# Patient Record
Sex: Male | Born: 1935
Health system: Southern US, Community
[De-identification: ages and names within clinical notes are randomized; demographics above are authoritative.]

## PROBLEM LIST (undated history)

## (undated) DIAGNOSIS — E782 Mixed hyperlipidemia: Secondary | ICD-10-CM

## (undated) DIAGNOSIS — G8929 Other chronic pain: Secondary | ICD-10-CM

## (undated) DIAGNOSIS — C61 Malignant neoplasm of prostate: Secondary | ICD-10-CM

## (undated) DIAGNOSIS — C4490 Unspecified malignant neoplasm of skin, unspecified: Secondary | ICD-10-CM

## (undated) DIAGNOSIS — I214 Non-ST elevation (NSTEMI) myocardial infarction: Secondary | ICD-10-CM

## (undated) DIAGNOSIS — H9190 Unspecified hearing loss, unspecified ear: Secondary | ICD-10-CM

## (undated) DIAGNOSIS — I255 Ischemic cardiomyopathy: Secondary | ICD-10-CM

## (undated) DIAGNOSIS — R739 Hyperglycemia, unspecified: Secondary | ICD-10-CM

## (undated) DIAGNOSIS — J189 Pneumonia, unspecified organism: Secondary | ICD-10-CM

## (undated) DIAGNOSIS — I480 Paroxysmal atrial fibrillation: Secondary | ICD-10-CM

## (undated) DIAGNOSIS — I2510 Atherosclerotic heart disease of native coronary artery without angina pectoris: Secondary | ICD-10-CM

## (undated) DIAGNOSIS — Z808 Family history of malignant neoplasm of other organs or systems: Secondary | ICD-10-CM

## (undated) DIAGNOSIS — Z978 Presence of other specified devices: Secondary | ICD-10-CM

## (undated) DIAGNOSIS — I10 Essential (primary) hypertension: Secondary | ICD-10-CM

## (undated) DIAGNOSIS — Z800 Family history of malignant neoplasm of digestive organs: Secondary | ICD-10-CM

## (undated) DIAGNOSIS — J45909 Unspecified asthma, uncomplicated: Secondary | ICD-10-CM

## (undated) DIAGNOSIS — Z8701 Personal history of pneumonia (recurrent): Secondary | ICD-10-CM

## (undated) DIAGNOSIS — M549 Dorsalgia, unspecified: Secondary | ICD-10-CM

## (undated) DIAGNOSIS — M1990 Unspecified osteoarthritis, unspecified site: Secondary | ICD-10-CM

## (undated) DIAGNOSIS — Z960 Presence of urogenital implants: Secondary | ICD-10-CM

## (undated) DIAGNOSIS — I639 Cerebral infarction, unspecified: Secondary | ICD-10-CM

## (undated) DIAGNOSIS — K219 Gastro-esophageal reflux disease without esophagitis: Secondary | ICD-10-CM

## (undated) HISTORY — DX: Atherosclerotic heart disease of native coronary artery without angina pectoris: I25.10

## (undated) HISTORY — DX: Mixed hyperlipidemia: E78.2

## (undated) HISTORY — DX: Ischemic cardiomyopathy: I25.5

## (undated) HISTORY — PX: TONSILLECTOMY: SUR1361

## (undated) HISTORY — DX: Malignant neoplasm of prostate: C61

## (undated) HISTORY — DX: Personal history of pneumonia (recurrent): Z87.01

## (undated) HISTORY — DX: Family history of malignant neoplasm of digestive organs: Z80.0

## (undated) HISTORY — DX: Hyperglycemia, unspecified: R73.9

## (undated) HISTORY — DX: Other chronic pain: G89.29

## (undated) HISTORY — DX: Other chronic pain: M54.9

## (undated) HISTORY — DX: Family history of malignant neoplasm of other organs or systems: Z80.8

## (undated) HISTORY — DX: Essential (primary) hypertension: I10

---

## 2002-11-16 HISTORY — PX: CORONARY ARTERY BYPASS GRAFT: SHX141

## 2003-01-02 ENCOUNTER — Ambulatory Visit (HOSPITAL_COMMUNITY): Admission: RE | Admit: 2003-01-02 | Discharge: 2003-01-03 | Payer: Self-pay | Admitting: Cardiology

## 2003-01-02 ENCOUNTER — Encounter: Payer: Self-pay | Admitting: Cardiology

## 2003-01-03 ENCOUNTER — Encounter: Payer: Self-pay | Admitting: Cardiothoracic Surgery

## 2003-01-09 ENCOUNTER — Encounter: Payer: Self-pay | Admitting: Cardiothoracic Surgery

## 2003-01-09 ENCOUNTER — Inpatient Hospital Stay (HOSPITAL_COMMUNITY): Admission: RE | Admit: 2003-01-09 | Discharge: 2003-01-15 | Payer: Self-pay | Admitting: Cardiothoracic Surgery

## 2003-01-10 ENCOUNTER — Encounter: Payer: Self-pay | Admitting: Cardiothoracic Surgery

## 2003-01-11 ENCOUNTER — Encounter: Payer: Self-pay | Admitting: Cardiothoracic Surgery

## 2003-01-12 ENCOUNTER — Encounter: Payer: Self-pay | Admitting: Cardiothoracic Surgery

## 2004-09-29 ENCOUNTER — Ambulatory Visit: Payer: Self-pay | Admitting: Internal Medicine

## 2004-09-29 ENCOUNTER — Ambulatory Visit (HOSPITAL_COMMUNITY): Admission: RE | Admit: 2004-09-29 | Discharge: 2004-09-29 | Payer: Self-pay | Admitting: Internal Medicine

## 2005-04-17 ENCOUNTER — Ambulatory Visit: Payer: Self-pay | Admitting: Cardiology

## 2005-12-21 ENCOUNTER — Ambulatory Visit: Payer: Self-pay | Admitting: Cardiology

## 2006-12-21 ENCOUNTER — Ambulatory Visit: Payer: Self-pay | Admitting: Cardiology

## 2006-12-29 ENCOUNTER — Ambulatory Visit: Payer: Self-pay

## 2007-09-14 ENCOUNTER — Encounter: Payer: Self-pay | Admitting: Cardiology

## 2007-09-22 ENCOUNTER — Ambulatory Visit: Payer: Self-pay | Admitting: Cardiology

## 2007-09-23 ENCOUNTER — Encounter: Payer: Self-pay | Admitting: Cardiology

## 2007-10-03 ENCOUNTER — Encounter: Payer: Self-pay | Admitting: Cardiology

## 2007-10-06 ENCOUNTER — Ambulatory Visit: Payer: Self-pay | Admitting: Cardiology

## 2007-10-16 ENCOUNTER — Encounter: Payer: Self-pay | Admitting: Cardiology

## 2007-10-18 ENCOUNTER — Encounter: Admission: RE | Admit: 2007-10-18 | Discharge: 2007-11-07 | Payer: Self-pay | Admitting: Physician Assistant

## 2008-04-17 ENCOUNTER — Ambulatory Visit: Payer: Self-pay | Admitting: Cardiology

## 2008-07-17 ENCOUNTER — Encounter: Payer: Self-pay | Admitting: Cardiology

## 2008-09-06 ENCOUNTER — Encounter: Payer: Self-pay | Admitting: Cardiology

## 2008-11-15 ENCOUNTER — Ambulatory Visit: Payer: Self-pay | Admitting: Cardiology

## 2008-12-03 ENCOUNTER — Ambulatory Visit: Payer: Self-pay | Admitting: Cardiology

## 2009-02-05 ENCOUNTER — Ambulatory Visit: Payer: Self-pay | Admitting: Cardiology

## 2009-02-28 ENCOUNTER — Encounter: Payer: Self-pay | Admitting: Cardiology

## 2009-05-06 ENCOUNTER — Encounter: Admission: RE | Admit: 2009-05-06 | Discharge: 2009-05-06 | Payer: Self-pay | Admitting: Internal Medicine

## 2009-08-06 DIAGNOSIS — I252 Old myocardial infarction: Secondary | ICD-10-CM

## 2009-08-06 DIAGNOSIS — I10 Essential (primary) hypertension: Secondary | ICD-10-CM | POA: Insufficient documentation

## 2009-08-07 ENCOUNTER — Encounter: Payer: Self-pay | Admitting: Physician Assistant

## 2009-08-07 ENCOUNTER — Encounter (INDEPENDENT_AMBULATORY_CARE_PROVIDER_SITE_OTHER): Payer: Self-pay | Admitting: *Deleted

## 2009-08-07 ENCOUNTER — Ambulatory Visit: Payer: Self-pay | Admitting: Cardiology

## 2009-08-07 DIAGNOSIS — E785 Hyperlipidemia, unspecified: Secondary | ICD-10-CM | POA: Insufficient documentation

## 2009-08-13 ENCOUNTER — Encounter: Payer: Self-pay | Admitting: Physician Assistant

## 2009-08-13 ENCOUNTER — Ambulatory Visit: Payer: Self-pay | Admitting: Cardiology

## 2009-08-15 ENCOUNTER — Encounter (INDEPENDENT_AMBULATORY_CARE_PROVIDER_SITE_OTHER): Payer: Self-pay | Admitting: *Deleted

## 2009-08-15 ENCOUNTER — Encounter: Payer: Self-pay | Admitting: Physician Assistant

## 2009-08-15 ENCOUNTER — Ambulatory Visit: Payer: Self-pay | Admitting: Cardiology

## 2009-08-19 ENCOUNTER — Encounter (INDEPENDENT_AMBULATORY_CARE_PROVIDER_SITE_OTHER): Payer: Self-pay | Admitting: *Deleted

## 2009-08-19 ENCOUNTER — Encounter: Payer: Self-pay | Admitting: Cardiology

## 2009-08-20 ENCOUNTER — Inpatient Hospital Stay (HOSPITAL_BASED_OUTPATIENT_CLINIC_OR_DEPARTMENT_OTHER): Admission: RE | Admit: 2009-08-20 | Discharge: 2009-08-20 | Payer: Self-pay | Admitting: Cardiology

## 2009-08-20 ENCOUNTER — Ambulatory Visit: Payer: Self-pay | Admitting: Cardiology

## 2009-09-30 ENCOUNTER — Ambulatory Visit: Payer: Self-pay | Admitting: Cardiology

## 2010-02-12 ENCOUNTER — Ambulatory Visit: Payer: Self-pay | Admitting: Cardiology

## 2010-02-18 ENCOUNTER — Encounter: Payer: Self-pay | Admitting: Cardiology

## 2010-02-25 ENCOUNTER — Encounter (INDEPENDENT_AMBULATORY_CARE_PROVIDER_SITE_OTHER): Payer: Self-pay | Admitting: *Deleted

## 2010-06-24 ENCOUNTER — Telehealth (INDEPENDENT_AMBULATORY_CARE_PROVIDER_SITE_OTHER): Payer: Self-pay | Admitting: *Deleted

## 2010-07-14 ENCOUNTER — Ambulatory Visit: Payer: Self-pay | Admitting: Cardiology

## 2010-11-18 ENCOUNTER — Encounter: Payer: Self-pay | Admitting: Cardiology

## 2010-11-24 ENCOUNTER — Encounter: Payer: Self-pay | Admitting: Cardiology

## 2010-12-15 ENCOUNTER — Encounter: Payer: Self-pay | Admitting: Cardiology

## 2010-12-18 NOTE — Assessment & Plan Note (Signed)
Summary: 6 MO FU PER SEPT REMINDER -SRS   Visit Type:  Follow-up Primary Jada Kuhnert:  Dr. Doreen Beam   History of Present Illness: 77 year old male presents for followup. He denies any problems with progressive angina or shortness of breath. 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  Recent followup labs from June showed cholesterol 165, triglycerides 142, HDL 47, LDL 90, AST 23, ALT 31.  He reports continued hormone therapy for prostate cancer, overall stable.  Preventive Screening-Counseling & Management  Alcohol-Tobacco     Smoking Status: never  Current Medications (verified): 1)  Aspirin Ec 325 Mg Tbec (Aspirin) .... Take One Tablet By Mouth Daily 2)  Simvastatin 40 Mg Tabs (Simvastatin) .... Take 1 Tab By Mouth At Bedtime 3)  Doxazosin Mesylate 4 Mg Tabs (Doxazosin Mesylate) .... Take 1/2 Tab By Mouth At Bedtime 4)  Metoprolol Tartrate 50 Mg Tabs (Metoprolol Tartrate) .... Take 1 Tablet By Mouth Two Times A Day 5)  Amlodipine Besylate 5 Mg Tabs (Amlodipine Besylate) .... Take One Tablet By Mouth Daily 6)  Fish Oil 1000 Mg Caps (Omega-3 Fatty Acids) .... Take 1 Tablet By Mouth Two Times A Day 7)  Isosorbide Mononitrate Cr 60 Mg Xr24h-Tab (Isosorbide Mononitrate) .... Take One Tablet By Mouth Daily (Place On File) 8)  Nitrostat 0.4 Mg Subl (Nitroglycerin) .Marland Kitchen.. 1 Tablet Under Tongue At Onset of Chest Pain; You May Repeat Every 5 Minutes For Up To 3 Doses. 9)  Megestrol Acetate 40 Mg Tabs (Megestrol Acetate) .... Take Half Tablet By Mouth Daily 10)  Calcium 500 Mg Tabs (Calcium) .... Take One Tablet Daily 11)  Vitamin D 400iu .... Take One Tablet Daily 12)  Tamsulosin Hcl 0.4 Mg Caps (Tamsulosin Hcl) .... Take 1 Tablet By Mouth Once A Day  Allergies (verified): No Known Drug Allergies  Comments:  Nurse/Medical Assistant: The patient's medication list and allergies were reviewed with the patient and were updated in the Medication and Allergy Lists.  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      Circumflex artery:  The circumflex artery had a total occlusion of the   first marginal branch and then was totally occluded.      Right coronary artery:  The right coronary artery was totally occluded   in its proximal portion.      The saphenous vein graft to the posterior descending artery had diffuse   30-40% narrowing in the proximal portion of the graft.      The LIMA graft to LAD was patent, but the LAD was total after the   insertion site, and the distal LAD filled by collaterals.      The saphenous vein graft to the diagonal branch LAD was completely   occluded at its origin.      The saphenous vein graft to the posterolateral branch of the circumflex   artery was patent and filled in an obtuse marginal branch by   collaterals.  Left ventriculogram:  The left ventriculogram performed in the [facility]   projection showed anterolateral wall hypokinesis with an estimated   ejection fraction of 55%. (08/20/2009)    Social History: Smoking Status:  never  Review of Systems  The patient denies anorexia, fever, weight gain, chest pain, syncope, dyspnea on exertion, peripheral edema, melena, and hematochezia.         Otherwise reviewed and negative.  Vital Signs:  Patient profile:   77 year old male Height:      68 inches Weight:      212 pounds BMI:      32.35 Pulse rate:   76 / minute BP sitting:   112 / 75  (left arm) Cuff size:   regular  Vitals Entered By: Carlye Grippe (July 14, 2010 10:19 AM)  Nutrition Counseling: Patient's BMI is greater than 25 and therefore counseled on weight management options.  Physical Exam  Additional Exam:  Overweight male in no acute distress. HEENT: Conjunctiva and lids normal, oropharynx with moist mucosa. Neck: Supple, no elevated jugular venous pressure or bruits. Lungs: Clear, diminished breath sounds, nonlabored. Cardiac: Regular rate and rhythm, no S3. Abdomen: Soft, nontender, bowel sounds present. Extremities: No pitting edema, distal pulses 1-2+.   EKG  Procedure date:  07/14/2010  Findings:      Sinus rhythm at 73 beats per minute with nonspecific ST-T changes.  Impression & Recommendations:  Problem # 1:  CORONARY ATHEROSCLEROSIS NATIVE CORONARY ARTERY (ICD-414.01)  Symptomatically stable on present medical therapy. Followup in 6 months.  His updated medication list for this problem includes:    Aspirin Ec 325 Mg Tbec (Aspirin) .Marland Kitchen... Take one tablet by mouth daily    Metoprolol Tartrate 50 Mg Tabs (Metoprolol tartrate) .Marland Kitchen... Take 1 tablet by mouth two times a day    Amlodipine Besylate 5 Mg Tabs (Amlodipine besylate) .Marland Kitchen... Take one tablet by mouth daily    Isosorbide Mononitrate Cr 60 Mg Xr24h-tab (Isosorbide mononitrate) .Marland Kitchen... Take one tablet by mouth daily (place on file)    Nitrostat 0.4 Mg Subl (Nitroglycerin) .Marland Kitchen... 1 tablet under tongue at onset of chest pain; you may repeat every 5 minutes for up to 3 doses.  Problem # 2:  HYPERTENSION (ICD-401.9)  Blood-pressure well-controlled today.  His updated medication list for this problem includes:    Aspirin Ec 325 Mg Tbec (Aspirin) .Marland Kitchen... Take one tablet by mouth daily    Doxazosin Mesylate 4 Mg Tabs (Doxazosin mesylate) .Marland Kitchen... Take 1/2 tab by mouth at bedtime    Metoprolol Tartrate 50 Mg Tabs (Metoprolol tartrate) .Marland Kitchen...  Take 1 tablet by mouth two times a day    Amlodipine Besylate 5 Mg Tabs (Amlodipine besylate) .Marland Kitchen... Take one tablet by mouth daily  Orders: EKG w/ Interpretation (93000)  Problem # 3:  DYSLIPIDEMIA (ICD-272.4)  Recent LDL looks good, liver function tests normal.  His updated medication list for this problem includes:    Simvastatin 40 Mg Tabs (Simvastatin) .Marland Kitchen... Take 1 tab by mouth at bedtime  Patient Instructions: 1)  Your physician wants you to follow-up in: 6 months. You will receive a reminder letter in the mail one-two months in advance. If you don't receive a letter, please call our office to schedule the follow-up appointment. 2)  Your physician recommends that you continue on your current medications as directed. Please refer to the Current Medication list given to you today.

## 2010-12-18 NOTE — Letter (Signed)
Summary: Engineer, materials at [facility]  [address]   [place], [place] [address]   Phone: [phone]  Fax: [phone]        February 25, 2010 MRN: [id number]    Elijah Santana [address] [place], [place]  [address]    Dear Elijah Santana,  Your test ordered by Selena Batten has been reviewed by your physician (or physician assistant) and was found to be normal or stable. Your physician (or physician assistant) felt no changes were needed at this time.  ____ Echocardiogram  ____ Cardiac Stress Test  __X__ Lab Work-Liver function labs look good and LDL (bad cholesterol) is at goal. Continue same medications.  ____ Peripheral vascular study of arms, legs or neck  ____ CT scan or X-ray  ____ Lung or Breathing test  ____ Other:   Thank you.   Cyril Loosen, RN, BSN    Duane Boston, M.D., F.A.C.C. Thressa Sheller, M.D., F.A.C.C. Oneal Grout, M.D., F.A.C.C. Cheree Ditto, M.D., F.A.C.C. Daiva Nakayama, M.D., F.A.C.C. Kenney Houseman, M.D., F.A.C.C. Jeanne Ivan, PA-C

## 2010-12-18 NOTE — Cardiovascular Report (Signed)
Summary: Cardiac Catheterization  Cardiac Catheterization   Imported By: Dorise Hiss 07/11/2010 08:54:08  _____________________________________________________________________  External Attachment:    Type:   Image     Comment:   External Document

## 2010-12-18 NOTE — Progress Notes (Signed)
Summary: Pt Call  Phone Note Call from Patient Call back at Home Phone [phone]   Summary of Call: Pt called regarding appt scheduled for August 29th. He states he just had a physical in June and would like to know if we should bring these results to his office visit on the 29th. Notified to that he should bring labs and other tests done at physicial to office visit. Pt verbalized understanding.  Initial call taken by: Cyril Loosen, RN, BSN,  June 24, 2010 2:28 PM

## 2010-12-18 NOTE — Medication Information (Signed)
Summary: RX Folder/ SIMVASTATIN  RX Folder/ SIMVASTATIN   Imported By: Dorise Hiss 11/25/2010 14:13:09  _____________________________________________________________________  External Attachment:    Type:   Image     Comment:   External Document

## 2010-12-18 NOTE — Assessment & Plan Note (Signed)
Summary: 4 MO F/U PER REMINDER-JM   Visit Type:  Follow-up Primary Provider:  Dr. Doreen Beam   History of Present Illness: 77 year old male presents for a followup visit. He saw Dr. Antoine Poche back in November of last year, at which time it was suggested that Imdur could be increased for better anginal control. This medication change was not made however, and Elijah Santana indicates fairly good angina control anyway. He reports compliance with medications. He has stable NYHA class II dyspnea on exertion.  Today we reviewed the results of his cardiac catheterization from October, and plan for continued medical therapy. We also discussed repeating a lipid profile liver function tests.  Preventive Screening-Counseling & Management  Alcohol-Tobacco     Smoking Status: quit  Comments: quit 20 years ago  Current Medications (verified): 1)  Aspirin Ec 325 Mg Tbec (Aspirin) .... Take One Tablet By Mouth Daily 2)  Simvastatin 40 Mg Tabs (Simvastatin) .... Take 1 Tab By Mouth At Bedtime 3)  Doxazosin Mesylate 4 Mg Tabs (Doxazosin Mesylate) .... Take 1 Tab By Mouth At Bedtime 4)  Metoprolol Tartrate 50 Mg Tabs (Metoprolol Tartrate) .... Take 1 Tablet By Mouth Two Times A Day 5)  Amlodipine Besylate 5 Mg Tabs (Amlodipine Besylate) .... Take One Tablet By Mouth Daily 6)  Fish Oil 1000 Mg Caps (Omega-3 Fatty Acids) .... Take 1 Tablet By Mouth Two Times A Day 7)  Isosorbide Mononitrate Cr 60 Mg Xr24h-Tab (Isosorbide Mononitrate) .... Take One Tablet By Mouth Daily (Place On File) 8)  Nitrostat 0.4 Mg Subl (Nitroglycerin) .Marland Kitchen.. 1 Tablet Under Tongue At Onset of Chest Pain; You May Repeat Every 5 Minutes For Up To 3 Doses. 9)  Megestrol Acetate 40 Mg Tabs (Megestrol Acetate) .... Take Half Tablet By Mouth Daily 10)  Calcium 500 Mg Tabs (Calcium) .... Take One Tablet Daily 11)  Vitamin D 400iu .... Take One Tablet Daily  Allergies (verified): No Known Drug Allergies  Comments:  Nurse/Medical  Assistant: The patient's medications and allergies were reviewed with the patient and were updated in the Medication and Allergy Lists. 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      Circumflex artery:  The circumflex artery had a total occlusion of the   first marginal branch and then was totally occluded.      Right coronary artery:  The right coronary artery was totally occluded   in its proximal portion.      The saphenous vein graft to the posterior descending artery had diffuse   30-40% narrowing in the proximal portion of the graft.      The LIMA graft to LAD was patent, but the LAD was total after the   insertion site, and the distal LAD filled by collaterals.      The saphenous vein graft to the diagonal branch LAD was completely   occluded at its origin.  The saphenous vein graft to the posterolateral branch of the circumflex   artery was patent and filled in an obtuse marginal branch by   collaterals.      Left ventriculogram:  The left ventriculogram performed in the [facility]   projection showed anterolateral wall hypokinesis with an estimated   ejection fraction of 55%. (08/20/2009)    Review of Systems  The patient denies anorexia, fever, weight loss, chest pain, syncope, peripheral edema, abdominal pain,  melena, and hematochezia.         Otherwise reviewed and negative.  Vital Signs:  Patient profile:   77 year old male Height:      68 inches Weight:      211.4 pounds Pulse rate:   68 / minute BP sitting:   105 / 68  (left arm) Cuff size:   regular  Vitals Entered By: Elijah Mighty RN (February 12, 2010 2:11 PM)   Physical Exam  Additional Exam:  Overweight male in no acute distress. HEENT: Conjunctiva and lids normal, oropharynx with moist mucosa. Neck: Supple, no elevated jugular venous pressure or bruits. Lungs: Clear, diminished breath sounds, nonlabored. Cardiac: Regular rate and rhythm, no S3. Abdomen: Soft, nontender, bowel sounds present. Extremities: No pitting edema, distal pulses 1-2+.   Impression & Recommendations:  Problem # 1:  CORONARY ATHEROSCLEROSIS NATIVE CORONARY ARTERY (ICD-414.01)  Symptomatically stable on present medical regimen. We will keep his Imdur at 60 mg daily. 90 day prescriptions were given for simvastatin, Lopressor, amlodipine, and Imdur since he plans to use mail order. I will see him back over the next 6 months.  His updated medication list for this problem includes:    Aspirin Ec 325 Mg Tbec (Aspirin) .Marland Kitchen... Take one tablet by mouth daily    Metoprolol Tartrate 50 Mg Tabs (Metoprolol tartrate) .Marland Kitchen... Take 1 tablet by mouth two times a day    Amlodipine Besylate 5 Mg Tabs (Amlodipine besylate) .Marland Kitchen... Take one tablet by mouth daily    Isosorbide Mononitrate Cr 60 Mg Xr24h-tab (Isosorbide mononitrate) .Marland Kitchen... Take one tablet by mouth daily (place on file)    Nitrostat 0.4 Mg Subl (Nitroglycerin) .Marland Kitchen... 1 tablet under tongue at onset of chest pain; you may repeat every 5 minutes for up to 3 doses.  Problem # 2:  DYSLIPIDEMIA (ICD-272.4)  A followup lipid profile and liver function tests will be obtained.  His updated medication list for this problem includes:    Simvastatin 40 Mg Tabs (Simvastatin) .Marland Kitchen... Take 1 tab by mouth at  bedtime  Orders: T-Lipid Profile ([id number]) T-Hepatic Function [phone])  Problem # 3:  HYPERTENSION (ICD-401.9)  Blood pressure well controlled.  His updated medication list for this problem includes:    Aspirin Ec 325 Mg Tbec (Aspirin) .Marland Kitchen... Take one tablet by mouth daily    Doxazosin Mesylate 4 Mg Tabs (Doxazosin mesylate) .Marland Kitchen... Take 1 tab by mouth at bedtime    Metoprolol Tartrate 50 Mg Tabs (Metoprolol tartrate) .Marland Kitchen... Take 1 tablet by mouth two times a day    Amlodipine Besylate 5 Mg Tabs (Amlodipine besylate) .Marland Kitchen... Take one tablet by mouth daily  Patient Instructions: 1)  Your physician wants you to follow-up in: 6 months. You will receive a reminder letter in the mail one-two months in advance. If you don't receive a letter, please call our office to schedule the follow-up appointment. 2)  Your physician recommends that you go to the [facility] for a FASTING lipid profile and liver function labs. Do not eat or  drink after midnight.  3)  Refills sent electronically to [facility]. Prescriptions: ISOSORBIDE MONONITRATE CR 60 MG XR24H-TAB (ISOSORBIDE MONONITRATE) Take one tablet by mouth daily (PLACE ON FILE)  #90 x 3   Entered by:   Cyril Loosen, RN, BSN   Authorized by:   Loreli Slot, MD, [facility]   Signed by:   Cyril Loosen, RN, BSN on 02/12/2010   Method used:   Electronically to        [facility]* (retail)       [address] [place], [place]  [address]       Ph: [phone]       Fax: [phone]   RxID:   [id number] AMLODIPINE BESYLATE 5 MG TABS (AMLODIPINE BESYLATE) Take one tablet by mouth daily  #90 x 3   Entered by:   Cyril Loosen, RN, BSN   Authorized by:   Loreli Slot, MD, [facility]   Signed by:   Cyril Loosen, RN, BSN on 02/12/2010   Method used:   Electronically to        [facility]* (retail)       [address] [place], [place]  [address]       Ph: [phone]       Fax: [phone]   RxID:    [id number] METOPROLOL TARTRATE 50 MG TABS (METOPROLOL TARTRATE) Take 1 tablet by mouth two times a day  #180 x 3   Entered by:   Cyril Loosen, RN, BSN   Authorized by:   Loreli Slot, MD, [facility]   Signed by:   Cyril Loosen, RN, BSN on 02/12/2010   Method used:   Electronically to        [facility]* (retail)       [address] [place], [place]  [address]       Ph: [phone]       Fax: [phone]   RxID:   [id number] SIMVASTATIN 40 MG TABS (SIMVASTATIN) Take 1 tab by mouth at bedtime  #90 x 3   Entered by:   Cyril Loosen, RN, BSN   Authorized by:   Loreli Slot, MD, [facility]   Signed by:   Cyril Loosen, RN, BSN on 02/12/2010   Method used:   Electronically to        [facility]* (retail)       [address] [place], [place]  [address]       Ph: [phone]       Fax: [phone]   RxID:   [id number]

## 2010-12-24 ENCOUNTER — Encounter: Payer: Self-pay | Admitting: Cardiology

## 2010-12-24 NOTE — Miscellaneous (Signed)
Summary: Orders Update  Clinical Lists Changes  Orders: Added new Test order of T-Lipid Profile [id number]) - Signed Added new Test order of T-Hepatic Function [phone]) - Signed      Pt will do labs before office visit with Dr. Diona Browner

## 2010-12-26 ENCOUNTER — Ambulatory Visit (INDEPENDENT_AMBULATORY_CARE_PROVIDER_SITE_OTHER): Payer: BC Managed Care – PPO | Admitting: Cardiology

## 2010-12-26 ENCOUNTER — Encounter: Payer: Self-pay | Admitting: Cardiology

## 2010-12-26 DIAGNOSIS — I10 Essential (primary) hypertension: Secondary | ICD-10-CM

## 2010-12-26 DIAGNOSIS — I2510 Atherosclerotic heart disease of native coronary artery without angina pectoris: Secondary | ICD-10-CM

## 2010-12-26 DIAGNOSIS — E782 Mixed hyperlipidemia: Secondary | ICD-10-CM

## 2011-01-01 NOTE — Assessment & Plan Note (Signed)
Summary: 6 MONTH FU-RECV REMINDER-, NO HOSPITAL-VS/JM   Visit Type:  Follow-up Primary Provider:  Dr. Doreen Beam   History of Present Illness: 77 year old male presents for followup. He was seen back in August 2011.  He reports intermittent exertional angina and shortness of breath, particularly if he "pushes it." Having said that, he is not exercising regularly. 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 States that this is the first time that he has not worked in 62 years. We discussed this some today.  He is interested in perhaps joining the [employer] to do some indoor walking, particularly in the extremes of temperature. Otherwise he reports compliance with medications.  Followup labs from 8 February showed AST 18, ALT 24, cholesterol 140, triglycerides 117, HDL 40, LDL 77.  I reviewed with him the results of his cardiac catheterization from October 2010. We are planning to continue medical therapy at this point.  Preventive Screening-Counseling & Management  Alcohol-Tobacco     Smoking Status: never  Current Medications (verified): 1)  Aspirin Ec 325 Mg Tbec (Aspirin) .... Take One Tablet By Mouth Daily 2)  Simvastatin 20 Mg Tabs (Simvastatin) .... Take 1 Tablet By Mouth Once A Day 3)  Doxazosin Mesylate 4 Mg Tabs (Doxazosin Mesylate) .... Take 1/2 Tab By Mouth At Bedtime 4)  Metoprolol Tartrate 50 Mg Tabs (Metoprolol Tartrate) .... Take 1 Tablet By Mouth Two Times A Day 5)  Amlodipine Besylate 10 Mg Tabs (Amlodipine Besylate) .... Take 1 Tablet By Mouth Once A Day 6)  Fish Oil 1000 Mg Caps (Omega-3 Fatty Acids) .... Take 1 Tablet By Mouth Two Times A Day 7)  Isosorbide Mononitrate Cr 60 Mg Xr24h-Tab (Isosorbide Mononitrate) .... Take One Tablet By Mouth Daily (Place On File) 8)  Nitrostat 0.4 Mg Subl (Nitroglycerin) .Marland Kitchen.. 1 Tablet Under Tongue At Onset of Chest Pain; You May Repeat Every 5 Minutes For Up To 3 Doses. 9)  Megestrol Acetate 40 Mg Tabs  (Megestrol Acetate) .... Take Half Tablet By Mouth Daily 10)  Calcium 500 Mg Tabs (Calcium) .... Take One Tablet Daily 11)  Vitamin D 400iu .... Take One Tablet Daily 12)  Tamsulosin Hcl 0.4 Mg Caps (Tamsulosin Hcl) .... Take 1 Tablet By Mouth Once A Day  Allergies (verified): No Known Drug Allergies  Comments:  Nurse/Medical Assistant: The patient's medication list and allergies were reviewed with the patient and were updated in the Medication and Allergy Lists.  Past History:  Past Medical History: Last updated: 02/10/2010 Chronic Back pain Prostate Cancer (radiation therapy, 1996) CAD - multivessel, LVEF 55% Hyperlipidemia Hypertension Myocardial Infarction  Past Surgical History: Last updated: 02/10/2010 CABG 2004 - LIMA to LAD, SVG to diagonal, SVG to circumflex, SVG to PDA  Social History: Last updated: 02/10/2010 Retired  Tobacco Use - No Alcohol Use - no Drug Use - no  Review of Systems       The patient complains of weight gain.  The patient denies anorexia, fever, syncope, peripheral edema, headaches, abdominal pain, melena, and hematochezia.         Reports recent prolonged cold. No fevers or chills. Otherwise reviewed and negative.  Vital Signs:  Patient profile:   77 year old male Height:      68 inches Weight:      218 pounds Pulse rate:   67 / minute BP sitting:   144 / 83  (left arm) Cuff size:   large  Vitals Entered By: Carlye Grippe (December 26, 2010 9:09 AM)  Physical Exam  Additional Exam:  Overweight male in no acute distress. HEENT: Conjunctiva and lids normal, oropharynx with moist mucosa. Neck: Supple, no elevated jugular venous pressure or bruits. Lungs: Clear, diminished breath sounds, nonlabored. Cardiac: Regular rate and rhythm, no S3. Abdomen: Soft, nontender, bowel sounds present. Extremities: No pitting edema, distal pulses 1-2+. Skin: Warm and dry. Musculoskeletal: No kyphosis. Neuropsychiatric: Alert and oriented x3,  affect appropriate.   EKG  Procedure date:  11/18/2010  Findings:      Sinus rhythm at 65 beats per minute with motion artifact, nonspecific ST changes.  Prior Report Reviewed for Cardiac Cath:  Findings: 08/20/2009 RESULTS:  Left anterior descending artery:  The left anterior descending   artery was completely occluded near its origin.      Circumflex artery:  The circumflex artery had a total occlusion of the   first marginal branch and then was totally occluded.      Right coronary artery:  The right coronary artery was totally occluded   in its proximal portion.      The saphenous vein graft to the posterior descending artery had diffuse   30-40% narrowing in the proximal portion of the graft.      The LIMA graft to LAD was patent, but the LAD was total after the   insertion site, and the distal LAD filled by collaterals.      The saphenous vein graft to the diagonal branch LAD was completely   occluded at its origin.      The saphenous vein graft to the posterolateral branch of the circumflex   artery was patent and filled in an obtuse marginal branch by   collaterals.      Left ventriculogram:  The left ventriculogram performed in the RA[facility]   projection showed anterolateral wall hypokinesis with an estimated   ejection fraction of 55%.  Comments:    Impression & Recommendations:  Problem # 1:  CORONARY ATHEROSCLEROSIS NATIVE CORONARY ARTERY (ICD-414.01)  Both native vessel and bypass graft disease, being managed medically at this point. Today we discussed a variety of issues including diet with a goal of weight loss, regular walking regimen, and also advancing Norvasc for antianginal benefit. I plan to see him back in 6 months, sooner if needed.  His updated medication list for this problem includes:    Aspirin Ec 325 Mg Tbec (Aspirin) .Marland Kitchen... Take one tablet by mouth daily    Metoprolol Tartrate 50 Mg Tabs (Metoprolol tartrate) .Marland Kitchen... Take 1 tablet by mouth two  times a day    Amlodipine Besylate 10 Mg Tabs (Amlodipine besylate) .Marland Kitchen... Take 1 tablet by mouth once a day    Isosorbide Mononitrate Cr 60 Mg Xr24h-tab (Isosorbide mononitrate) .Marland Kitchen... Take one tablet by mouth daily (place on file)    Nitrostat 0.4 Mg Subl (Nitroglycerin) .Marland Kitchen... 1 tablet under tongue at onset of chest pain; you may repeat every 5 minutes for up to 3 doses.  Orders: EKG w/ Interpretation (93000)Future Orders: T-Lipid Profile ([id number]) ... 03/17/2011 T-Hepatic Function [id number]) ... 03/17/2011  Problem # 2:  HYPERTENSION (ICD-401.9)  Norvasc being increased.  His updated medication list for this problem includes:    Aspirin Ec 325 Mg Tbec (Aspirin) .Marland Kitchen... Take one tablet by mouth daily    Doxazosin Mesylate 4 Mg Tabs (Doxazosin mesylate) .Marland Kitchen... Take 1/2 tab by mouth at bedtime    Metoprolol Tartrate 50 Mg Tabs (Metoprolol tartrate) .Marland Kitchen... Take 1 tablet by mouth two times a  day    Amlodipine Besylate 10 Mg Tabs (Amlodipine besylate) .Marland Kitchen... Take 1 tablet by mouth once a day  Problem # 3:  DYSLIPIDEMIA (ICD-272.4)  Lipids have generally been well controlled.  His updated medication list for this problem includes:    Simvastatin 20 Mg Tabs (Simvastatin) .Marland Kitchen... Take 1 tablet by mouth once a day  Future Orders: T-Lipid Profile ([id number]) ... 03/17/2011 T-Hepatic Function [id number]) ... 03/17/2011  Patient Instructions: 1)  Increase Norvasc to 10mg  daily 2)  Labs:  FLP/LFT before next visit 3)  Follow up in  3 months Prescriptions: AMLODIPINE BESYLATE 10 MG TABS (AMLODIPINE BESYLATE) Take 1 tablet by mouth once a day  #30 x 1   Entered by:   Hoover Brunette, LPN   Authorized by:   Loreli Slot, MD, [facility]   Signed by:   Hoover Brunette, LPN on 84/69/6295   Method used:   Electronically to        [facility]* (retail)       [address]       [place], [place]  [address]       Ph: [phone]       Fax: [phone]   RxID:    [id number]

## 2011-03-25 ENCOUNTER — Ambulatory Visit: Payer: BC Managed Care – PPO | Admitting: Cardiology

## 2011-03-31 NOTE — Assessment & Plan Note (Signed)
[facility]                          [facility] OFFICE NOTE   Elijah Santana, Elijah Santana                        MRN:          [id number]  DATE:02/05/2009                            DOB:          [birth year]    PRIMARY CARE PHYSICIAN:  Doreen Beam, MD   REASON FOR VISIT:  Scheduled followup.   HISTORY OF PRESENT ILLNESS:  Elijah Santana returns for regular visit.  He is  not reporting any progressive angina.  He had some questions about his  medications and reviewed these in detail today.  He has not yet had  followup liver function or lipids since his changed to Lipitor.  He has  been taking Omega-3 supplements regularly.  Today's electrocardiogram  shows sinus bradycardia with otherwise no acute ST-T wave changes.  There are nonspecific ST changes noted.  He did not initiate Norvasc  which we had discussed last time as both an antianginal and  antihypertensive.  We talked about this some again today.  Lipids as of  September 2009 showed an LDL of 69, HDL 38, total cholesterol 914, and  triglycerides 253.   ALLERGIES:  No known drug allergies.   MEDICATIONS:  1. Aspirin 81 mg p.o. daily.  2. Lipitor 20 mg p.o. q.h.s.  3. Flomax 0.4 mg p.o. daily.  4. Omega-3 supplements 1000 mg p.o. t.i.d.  5. Doxazosin 4 mg p.o. daily.  6. Metoprolol 50 mg p.o. b.i.d.   REVIEW OF SYSTEMS:  Described above, otherwise negative.   PHYSICAL EXAMINATION:  VITAL SIGNS:  Blood pressure 180/90, rechecked at  162/92 in the left arm, heart rate 53, and weights 214 pounds.  GENERAL:  Overweight male in no acute distress.  HEENT:  Conjunctivae are normal.  Oropharynx is clear.  NECK:  Supple.  No elevated jugular venous pressure.  No loud bruits or  thyromegaly.  LUNGS:  Clear without labored breathing at rest.  CARDIAC:  Regular rate and rhythm.  No rub, murmur, or gallop.  ABDOMEN:  Soft and nontender.  No bowel sounds.  EXTREMITIES:  Exhibit no significant pitting edema,  pulses are 2+.  SKIN:  Warm and dry.  MUSCULOSKELETAL:  No Kyphosis noted.  NEUROPSYCHIATRIC:  The patient is alert and oriented 3.  Affect is  appropriate   IMPRESSION AND RECOMMENDATIONS:  1. Multivessel cardiovascular disease, status post coronary bypass      grafting in 2004 with a LIMA to left anterior descending, saphenous      vein graft to the diagonal, saphenous vein graft to distal      circumflex, and saphenous vein graft to the distal right coronary      artery.  Lexiscan Myoview through [facility] in October      2009, demonstrated a small area of scar in the anterior apical wall      with an ejection fraction of 56%.  Symptomatically, Elijah Santana is      stable.  His blood pressure is not well controlled.  I would like      for him to start on Norvasc 5 mg daily for both  antianginal and      antihypertensive benefit.  We will plan to bring back to the office      over the next 4 months.  2. Hyperlipidemia, now on Lipitor and Omega-3 supplements.  We will      plan to follow up lipid profile and liver function tests.     Jonelle Sidle, MD  Electronically Signed    SGM/MedQ  DD: 02/05/2009  DT: 02/06/2009  Job #: [id number]   cc:   Doreen Beam, MD

## 2011-03-31 NOTE — Assessment & Plan Note (Signed)
[facility]                          [facility] OFFICE NOTE   ABAD, MANARD                        MRN:          [id number]  DATE:12/03/2008                            DOB:          07-01-1932    PRIMARY CARE PHYSICIAN:  Doreen Beam, MD   REASON FOR VISIT:  Follow up angina.   HISTORY OF PRESENT ILLNESS:  I saw Mr. Harold recently in followup.  His  history is detailed in my previous note including multivessel  cardiovascular disease, status post coronary artery bypass grafting in  2004 with a LIMA to left anterior descending, saphenous vein graft to  diagonal, saphenous vein graft to distal circumflex, and saphenous vein  graft to distal right coronary artery associated with ejection fraction  of 45-50%.  He has had some symptoms consistent with angina over the  last several months and underwent a Lexiscan Myoview done through [facility]  [facility] back in late October.  I did not have these results at  hand when the patient was in the office last time, although have  subsequently been able to review the report.  His study revealed a small  area of scar in the apical anterior wall, although no other evidence of  ischemia and an ejection fraction of 56%.  I reviewed this with the  patient and his daughter present today.  Symptomatically, Mr. Hemp has  used a single sublingual nitroglycerin which I provided him in his last  visit, although has had no progressive symptoms otherwise.  We discussed  the fact that he has likely had some progression in his cardiovascular  disease over the last 5-6 years since his bypass.  His stress testing  is, however, not high risk.  We talked about advancing medical therapy  and following him symptomatically, considering a diagnostic  catheterization if we are not able to obtain better symptom management.  They were comfortable with this.   ALLERGIES:  No known drug allergies.   PRESENT MEDICATIONS:  1.  Lopressor 50 mg p.o. b.i.d.  2. Doxazosin 4 mg p.o. daily.  3. Aspirin 81 mg p.o. daily.  4. Lipitor 20 mg p.o. nightly.  5. Sublingual nitroglycerin 0.4 mg p.r.n.   REVIEW OF SYSTEMS:  As described in the history of present illness.   PHYSICAL EXAMINATION:  VITAL SIGNS:  Blood pressure is 140/78, heart  rate is 66, and weight is 716 pounds.  GENERAL:  The patient is comfortable and in no acute distress.  Otherwise no significant change in baseline examination.   IMPRESSION AND RECOMMENDATIONS:  Multivessel cardiovascular disease as  outlined.  Recent Lexiscan Myoview done through [facility]  demonstrates a small area of anteroapical scar, but no frank ischemia  and an ejection fraction of 56%.  We will plan to further titrate  medical therapy for anti-anginal control and follow him symptomatically  over the next few months.  Norvasc 5 mg p.o. daily is being added to his  present medications.  He will let me know otherwise if he has  progressive symptoms in the interim.  A diagnostic catheterization  can  certainly be considered if we are not able to obtain more optimal  control with medications.     Jonelle Sidle, MD  Electronically Signed    SGM/MedQ  DD: 12/03/2008  DT: 12/04/2008  Job #: [id number]   cc:   Doreen Beam, MD

## 2011-03-31 NOTE — Assessment & Plan Note (Signed)
Elijah Santana                          Elijah Santana   Elijah, Santana                        MRN:          [id number]  DATE:11/15/2008                            DOB:          Nov 02, 1932    PRIMARY CARE PHYSICIAN:  Doreen Beam, MD   REASON FOR VISIT:  Scheduled followup.   HISTORY OF PRESENT ILLNESS:  I saw Elijah Santana back in June 2009.  He  reports that since I last saw him, he has had symptoms similar to prior  findings of obstructive coronary artery disease and generally has felt  more limited with activity with occasional chest pain and progressive  shortness of breath.  My understanding is that he had a Myoview and an  echocardiogram done through [facility] sometime back in  October, although I do not have the results for review.  The patient is  not entirely clear as to the results and whether any further evaluation  was required.  He reports compliance with his medications and is now on  Lipitor and simvastatin.  Labs from September showed an LDL of 69,  triglycerides of 253, total cholesterol 158, and HDL of 38.  He also  states that he was found to have an elevated PSA and has been seen by  Urology and has felt to have recurrence of prostate cancer.  He was  treated with radiation in the past and hormone therapy is being  considered now.  Elijah Santana is not using any sublingual nitroglycerin.  Today, we talked that the possibility of a diagnostic cardiac  catheterization to assess his bypass graft patency in light of his  symptoms predominately, but I would also like to see his Myoview results  from October.  Dr. Sherril Croon' office is closed today.  Elijah Santana and I talked  about the situation and I will plan to bring him back over the next few  weeks and obtain his cardiac testing in the meanwhile.  He will continue  his medications and we gave him a prescription for sublingual  nitroglycerin as well.   ALLERGIES:  No known  drug allergies.   PRESENT MEDICATIONS:  1. Metoprolol 50 mg p.o. b.i.d.  2. Doxazosin 4 mg p.o. daily.  3. Aspirin 81 mg p.o. daily.  4. Lipitor 20 mg p.o. nightly.  5. Sublingual nitroglycerin 0.4 mg p.r.n.   REVIEW OF SYSTEMS:  As described in history of present illness.  Otherwise negative.   PHYSICAL EXAMINATION:  VITAL SIGNS:  Blood pressure is 166/90, heart  rate is 67, and weight is 216 pounds.  GENERAL:  He is an overweight male, in no acute distress.  HEENT:  Conjunctivae are normal.  Oropharynx is clear.  NECK:  Supple.  No elevated jugular venous pressure.  No loud bruits.  No thyromegaly is noted.  LUNGS:  Clear without breathing at rest.  CARDIAC:  Regular rate and rhythm.  No loud murmur or gallop.  ABDOMEN:  Soft, nontender.  EXTREMITIES:  Exhibit no significant pitting edema.   IMPRESSION AND RECOMMENDATIONS:  Known multivessel coronary artery  disease,  status post coronary artery bypass grafting in 2004 with a left  internal mammary artery to the left anterior descending, saphenous vein  graft to diagonal, saphenous vein graft to the distal circumflex, and  saphenous vein graft to the distal right coronary artery with an  ejection fraction of approximately 45-50%.  I will plan to obtain the  patient's echocardiogram and Myoview results from [facility]  done over the last few months and will bring Elijah Santana back to the office  to discuss these results and whether we should proceed with a diagnostic  angiogram or not.  A prescription was given for sublingual  nitroglycerin.  We also talked about trying an omega-3 supplement given  his elevated triglycerides.  Otherwise, he will continue his present  medical therapy.  Further plans to follow.     Jonelle Sidle, MD  Electronically Signed    SGM/MedQ  DD: 11/15/2008  DT: 11/16/2008  Job #: [id number]   cc:   Doreen Beam, MD

## 2011-03-31 NOTE — Assessment & Plan Note (Signed)
[facility]                          [facility]ICE NOTE   Elijah Santana, Elijah Santana                        MRN:          [id number]  DATE:09/22/2007                            DOB:          August 24, 1932    PRIMARY CARDIOLOGIST:  Elijah Sidle, MD   REASON FOR REFERRAL:  Chest pain.   HISTORY OF PRESENT ILLNESS:  [name] is a 77 year old male patient with  a history of coronary artery disease status post multiple percutaneous  coronary interventions in the past and subsequent coronary artery bypass  graft in 2004. He has had mild LV dysfunction in the past with an EF of  45-55%. He was last seen by Dr. Diona Browner December 21, 2006. At that point  in time, he was set up for surveillance Myoview testing. The patient  says that he did undergo stress testing, but there is no record of it in  the computer system.   The patient recently developed some chest discomfort after making two  long trips by car. He has had a left-sided chest discomfort that he  describes as a pain. He feels like he noticed it the longer he drove and  was especially associated with movement of his left upper extremity. He  denies any exertional chest pain. He denies any radiation, nausea or  diaphoresis. He denies any syncope or near syncope. He describes mild  dyspnea with exertion. He describes NYHA class II symptoms. He denies  any orthopnea, PND or pedal edema. He denies any syncope or near  syncope. He questions whether or not there is a pleuritic component to  his chest pain. It has been fairly constant for the last two weeks. It  has improved and is feeling much better now.   CURRENT MEDICATIONS:  1. Metoprolol 50 mg b.i.d.  2. Doxazosin 4 mg daily.  3. Simvastatin 40 mg daily.  4. Aspirin 81 mg daily.   ALLERGIES:  No known drug allergies.   SOCIAL HISTORY:  He denies any tobacco abuse.   REVIEW OF SYSTEMS:  Please see HPI. He denies fevers, chills, cough,  melena or hematochezia, hematuria or dysuria. Rest of review of systems  are negative. The patient does note a problem with benign paroxysmal  positional vertigo for 20+ years. It has recurred recently. He has a  significant problem with spinning sensation that results in nausea. He  has tried meclizine in the past without much relief.   PHYSICAL EXAMINATION:  He is well-nourished, well-developed male in no  acute distress. Blood pressure 144/84. Pulse 73. Weight is 215.8 pounds.  HEENT: Is normal.  NECK: Without JVD or lymphadenopathy.  ENDOCRINE: Without thyromegaly.  CARDIAC: Normal S1, S2, regular rate and rhythm with a short systolic  ejection murmur heard best at the right upper sternal border graded 1/6.  LUNGS:  Are clear to auscultation bilaterally.  ABDOMEN: Soft and nontender.  EXTREMITIES: Without edema.  NEUROLOGIC: He is alert and oriented x3. Cranial nerves II-XII are  grossly intact.  VASCULAR: Carotids are without bruits bilaterally.   Electrocardiogram from Dr. Bonnita Levan office on September 14, 2007 reveals  normal sinus rhythm with a heart rate of 57, leftward axis. No acute  changes.   DATA REVIEWED:  Chest x-ray September 14, 2007, slight hyperinflation. No  acute process.   IMPRESSION:  1. Atypical chest pain.  2. Coronary artery disease.      a.     Status post multiple percutaneous coronary interventions in       the past.      b.     Status post coronary artery bypass graft in 2004. (Left       internal mammary artery  to left anterior descending artery;       saphenous vein graft  to DX; saphenous vein graft  to distal       circumflex; saphenous vein graft to distal right coronary artery.)  3. Mild left ventricular dysfunction with an ejection fraction of 45-      55% in the past.  4. Hypertension.  5. Treated dyslipidemia.  6. Benign paroxysmal positional vertigo.  7. History of chronic back pain.   PLAN:  The patient presents with complaints of chest  pain. His symptoms  of chest pain are quite atypical. He did have a recent long trip where  he amassed approximately 5000 miles on his car. He denies any syncope.  He was to have a stress test in February but I do not see that that has  been done. At this point in time, we plan to:  1. Proceed with outpatient stress Cardiolite testing to rule out      possibility of ischemia which is doubtful.  2. Proceed with a D-dimer and BMET today. If D-dimer is abnormal, he      will require chest CT angiography to rule out pulmonary embolus      especially in light of his recent long trip.  3. He will be referred to [facility] in [place]      for treatment of his benign paroxysmal positional vertigo.  4. I will bring him back in followup with Dr. Diona Browner in the next 2-3      weeks for review of the above testing and further recommendations.      Tereso Newcomer, PA-C  Electronically Signed      Learta Codding, MD,FACC  Electronically Signed   SW/MedQ  DD: 09/22/2007  DT: 09/22/2007  Job #: [id number]   cc:   Doreen Beam

## 2011-03-31 NOTE — Assessment & Plan Note (Signed)
[facility]                          [facility]   CRISS, PALLONE                        MRN:          [id number]  DATE:04/17/2008                            DOB:          04/25/1932    PRIMARY CARE PHYSICIAN:  Doreen Beam, MD   REASON FOR VISIT:  Routine follow-up.   HISTORY OF PRESENT ILLNESS:  Mr. Blankenburg comes in for a 80-month visit.  He  denies having any exertional angina or limiting breathlessness.  Within  the last week he has had some discomfort in the right side of his chest  described as a broken rib feeling.  He does state he occasionally  lifts 50-pound bags of feed and thinks he may have exacerbated a  musculoskeletal problem.  He has had this atypical pain in the past as  outlined in my previous note.  Otherwise he is not reporting any new  problems.  He does tell me that he underwent physical therapy for  occasional vertigo and that this was very helpful.  He does these  exercises at home as the need arises.   ALLERGIES:  No known drug allergies.   PRESENT MEDICATIONS:  1. Metoprolol 50 mg p.o. b.i.d.  2. Doxazosin 4 mg p.o. daily.  3. Simvastatin 40 mg p.o. daily.  4. Aspirin 81 mg p.o. daily.   REVIEW OF SYSTEMS:  As described in the history of present illness.  Otherwise negative.   EXAMINATION:  Blood pressure 155/95, recheck by me 128/82, heart rate is  59, weight is 215.4 pounds.  The patient is comfortable and in no acute distress.  Neck:  No elevated jugular venous pressure, no loud bruits.  LUNGS:  Clear without labored breathing.  CARDIAC:  A regular rate and rhythm without rub, murmur or gallop.  ABDOMEN:  Soft, nontender.  EXTREMITIES:  No frank pitting edema.   IMPRESSION/RECOMMENDATIONS:  1. Coronary disease status, post coronary artery bypass grafting in      2004 with a left internal mammary artery to left anterior      descending, saphenous vein graft to the diagonal, saphenous vein  graft to the distal circumflex and saphenous vein graft to the      distal right coronary artery.  Ejection fraction has been in the 45-      55% range.  The patient is symptomatically stable on medical      therapy.  His blood pressure was mildly increased today although      much better on recheck.  I have asked him to keep an eye on this.      An ACE inhibitor would be a good choice if needed.  He is due for a      follow-up physical with Dr.      Sherril Croon soon.  2. Hypertension, as outlined above.     Jonelle Sidle, MD  Electronically Signed    SGM/MedQ  DD: 04/17/2008  DT: 04/17/2008  Job #: [id number]   cc:   Doreen Beam, MD

## 2011-03-31 NOTE — Assessment & Plan Note (Signed)
[facility]                          [facility] NOTE   KEIGO, WHALLEY                        MRN:          [id number]  DATE:10/06/2007                            DOB:          [birth year]    PRIMARY CARE PHYSICIAN:  Dr. Doreen Beam.   REASON FOR VISIT:  Followup testing.   HISTORY OF PRESENT ILLNESS:  [name] was in the office earlier in  November and evaluated by Mr. Alben Spittle at that time.  He had been  complaining of some fairly atypical chest discomfort and was referred  for a number of studies including a D-dimer level which was found to be  mildly increased at 1.31.  This results in a CT scan of the chest done  on November 7th, demonstrating no evidence of pulmonary embolus and no  other acute process.  The patient was also scheduled for an exercise  Cardiolite on the 17th of this month, although he did not show for this  test.  He states that his symptoms have completely resolved over the  last few weeks and was wondering whether he had some type of  musculoskeletal discomfort in his chest.  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  He is not reporting any cough or hemoptysis and otherwise states  that he feels well without any exertional chest pain.  He states that he  had an echocardiogram done through [facility] and the  results are pending at this time.  I reviewed his medications today.   ALLERGIES:  No known drug allergies.   PRESENT MEDICATIONS:  1. Aspirin 81 mg p.o. daily.  2. Simvastatin 40 mg p.o. daily.  3. Doxazosin 4 mg p.o. daily.  4. Metoprolol 50 mg p.o. b.i.d.   REVIEW OF SYSTEMS:  As described in the history of present illness.  No  palpitations or syncope.   PHYSICAL EXAMINATION:  VITAL  SIGNS:  Blood pressure is 134/84, heart  rate is 73, weight is 219 pounds.  GENERAL:  The patient is comfortable in no acute distress.  NECK:  Reveals no elevated jugular venous pressure .  No loud bruits.  Thyromegaly is not noted.  LUNGS:  Clear.  Diminished breath sounds.  CARDIAC:  Reveals a regular rate and rhythm.  A soft systolic murmur  heard at the base.  No S3 gallop or pericardial rub.  EXTREMITIES:  Exhibit no significant pitting edema.   A 12-lead electrocardiogram today is normal showing a sinus rhythm at 70  beats per minute.   IMPRESSION/RECOMMENDATIONS:  1. Recent atypical chest pain, doubt cardiac.  It is likely that this      is musculoskeletal based on his description.  He had a CT scan of      his chest that excluded pulmonary embolus and showed no other acute      findings.  At this point, I do not feel strongly about proceeding      with the exercise Cardiolite that was originally planned and we      will hold off on this for now.  I have asked Mr. Kinner to be      observant for any new symptom changes and otherwise I would like to      see him back over the next 6 months.  He will continue his      medications.  2. Otherwise continue regular followup with Dr. Sherril Croon.     Jonelle Sidle, MD  Electronically Signed    SGM/MedQ  DD: 10/06/2007  DT: 10/06/2007  Job #: [id number]   cc:   Doreen Beam

## 2011-04-03 ENCOUNTER — Other Ambulatory Visit: Payer: Self-pay | Admitting: *Deleted

## 2011-04-03 DIAGNOSIS — Z79899 Other long term (current) drug therapy: Secondary | ICD-10-CM

## 2011-04-03 DIAGNOSIS — E785 Hyperlipidemia, unspecified: Secondary | ICD-10-CM

## 2011-04-03 NOTE — Assessment & Plan Note (Signed)
[facility]                            CARDIOLOGY OFFICE NOTE   CHUNG, CHAGOYA                        MRN:          [id number]  DATE:12/21/2006                            DOB:          [birth year]    PRIMARY CARE PHYSICIAN:  Dr. Doreen Beam.   REASON FOR VISIT:  Followup coronary artery disease.   HISTORY OF PRESENT ILLNESS:  Mr. Messmer was last seen up in the [place]  office back in February of 2007.  He has a history of multivessel  coronary artery disease status post coronary artery bypass grafting in  February of 2004, including a LIMA to the left anterior descending,  saphenous vein graft to the diagonal, saphenous vein graft to the  circumflex, and saphenous vein graft to the posterior descending branch  of the right coronary artery.  Left ventricular ejection fraction based  on various tests has ranged between 45-55%.  He is not manifesting any  active angina or progressive dyspnea on exertion.  He occasionally feels  a musculoskeletal-type pain in his chest when he lifts 50 pound bags at  work.  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  An electrocardiogram  today shows sinus bradycardia at 58 beats per minute with a leftward  axis.  His medications are largely unchanged.  He is now taking generic  simvastatin.  He had lipids obtained by Dr. Sherril Croon.  He has not had any  followup ischemic testing since 2004.   ALLERGIES:  No known drug allergies.   PRESENT MEDICATIONS:  1. Aspirin 81 mg p.o. daily.  2. Metoprolol 50 mg p.o. b.i.d.  3. Simvastatin 40 mg p.o. daily.  4. Doxazosin 4 mg p.o. daily.   REVIEW OF SYSTEMS:  As described in the history of present illness.   EXAMINATION:  Blood pressure today checked by me at 140/88, heart rate  is 58, weight 215 pounds.  The patient is comfortable and in no acute distress.  HEENT:  Conjunctivae looks normal.  Oropharynx is clear.  NECK:  Supple without elevated jugular venous pressure or loud  bruits.  No thyromegaly is noted.  LUNGS:  Generally clear without labored breathing.  CARDIAC:  Exam reveals a regular rate and rhythm with soft basal  systolic murmur, preserved S2.  No S3, gallop, or pericardial rub.  ABDOMEN:  Soft and nontender.  No hepatomegaly.  No bruits noted.  EXTREMITIES:  No significant pitting edema.  Distal pulses are 2+.  SKIN:  Warm and dry.  MUSCULOSKELETAL:  No kyphosis is noted.  NEURO/PSYCHIATRIC:  The patient is alert and oriented x3.   IMPRESSION/RECOMMENDATIONS:  1. Multivessel coronary artery disease status post coronary artery      bypass grafting in 2004.  We will plan a followup adenosine Myoview      on medical therapy.  If this is low risk, would anticipate      continued medical therapy and symptom observation with a routine      followup in 1 year's time.  Otherwise, we can discuss the options.  2. Hypertension, mildly increased today.  I have suggested that he      followup with Dr. Sherril Croon for this.  Blood pressure has traditionally      been fairly well controlled in the past at 120/80 at our last      visit.  If this trend continues, he may benefit from the addition      of an angiotensin-converting enzyme inhibitor.  3. History of hyperlipidemia on simvastatin.  Would suggest an LDL      around 70.     Jonelle Sidle, MD  Electronically Signed    SGM/MedQ  DD: 12/21/2006  DT: 12/21/2006  Job #: [id number]   cc:   Doreen Beam

## 2011-04-03 NOTE — Op Note (Signed)
NAME:  FERREL, SIMINGTON                           ACCOUNT NO.:  [id number]   MEDICAL RECORD NO.:  [id number]                   PATIENT TYPE:  INP   LOCATION:  2316                                 FACILITY:  [facility]   PHYSICIAN:  Gwenith Daily. Tyrone Sage, M.D.            DATE OF BIRTH:  [birth year]   DATE OF PROCEDURE:  01/09/2003  DATE OF DISCHARGE:                                 OPERATIVE REPORT   PREOPERATIVE DIAGNOSES:  Coronary occlusive disease.   POSTOPERATIVE DIAGNOSES:  Coronary occlusive disease.   OPERATION PERFORMED:  Coronary artery bypass grafting times four with the  left internal mammary to the left anterior descending coronary artery,  reversed saphenous vein graft to the diagonal coronary artery, reversed  saphenous vein graft to the distal circumflex coronary artery, reversed  saphenous vein graft to the posterior descending coronary artery) with right  Endo vein harvesting.   SURGEON:  Gwenith Daily. Tyrone Sage, M.D.   ASSISTANT:  Toribio Harbour, R.N.   ANESTHESIA:  General.   INDICATIONS FOR PROCEDURE:  The patient is a 77 year old male who presented  with new onset of angina.  He underwent cardiac catheterization by Arturo Morton.  Riley Kill, M.D., which demonstrated significant three-vessel disease including  an 80% proximal LAD lesion, total occlusion of the diagonal coronary artery.  A large first obtuse marginal that was relatively free of disease.  Total  occlusion of the distal circumflex.  He had diffuse disease throughout the  right coronary artery and 80% stenosis of a very small posterolateral  branch.  Overall ventricular function was preserved.  The patient previously  had had stent placement in the LAD.  The distal LAD appeared to be totally  occluded.  Because of the patient's three-vessel coronary artery disease and  symptoms, coronary artery bypass grafting was recommended, but with the  known situation that the patient did have significant distal disease.   The  patient agreed and signed informed consent.   DESCRIPTION OF PROCEDURE:  With Swann-Ganz and arterial line monitors in  place, the patient underwent general endotracheal anesthesia without  incident.  The skin of the chest and legs was prepped with Betadine and  draped in the usual sterile manner.  Using endoscopic vein harvesting  system, vein was harvested from the right thigh and was of good quality and  caliber.  To obtain sufficient vein below the knee was a segment of vein was  harvested from below the knee open.  A median sternotomy was performed.  The  left internal mammary artery was dissected down as a pedicle graft.  The  distal artery was divided and had good free flow.  The pericardium was  opened.  Overall ventricular function was preserved.  The patient was  systemically heparinized.  The ascending aorta and the right atrium were  cannulated in the aortic root.  A bent cardioplegia needle was introduced  into the ascending aorta.  The patient was placed on cardiopulmonary bypass  at 2.4L per minute per meter squared.  Sites for anastomosis were selected  and dissected out of the epicardium.  The patient's body temperature was  cooled to 30 degrees.  An aortic crossclamp was applied.  500 cc of cold  blood potassium cardioplegia was administered with rapid diastolic arrest of  the heart.  Myocardial septal temperature was monitored throughout the  crossclamp period.   Attention was turned first to the distal circumflex.  This vessel was a  small vessel admitting a 1 mm probe.  Using a running 7-0 Prolene, a distal  anastomosis was performed.  Attention was then turned to the diagonal  coronary artery.  This was a small vessel but was a long vessel that  admitted a 1 mm probe.  Using a running 7-0 Prolene distal anastomosis was  performed with a segment of reversed saphenus vein graft.  Attention was  then turned to the distal right and the posterior descending  coronary  artery.  The posterior descending coronary artery was opened and was a  relatively small vessel but using a running 7-0 Prolene, distal anastomosis  was performed.  The very distal posterolateral branch of the right coronary  artery was a very small vessel and was too small to bypass.  Attention was  then turned to the left anterior coronary artery.  This vessel was very  diffusely diseased.  Between the mid and distal third of the vessel, it was  opened and admitted a 1.5 mm probe proximally and a 1 mm probe distally.  The very distal branch of the LAD was totally occluded but beyond this, the  vessel was less than 1 mm in size at the apex.  Using a running 8-0 Prolene,  the left internal mammary artery was anastomosed to the left anterior  descending coronary artery.  With release of the Edwards bulldog on the  mammary artery, there was appropriate rise in myocardial septal temperature.  The aortic crossclamp was removed.  Total crossclamp time was 66 minutes.  The patient spontaneously converted to a sinus rhythm.  A partial occlusion  clamp was placed on the ascending aorta.  Three punch aortotomies were  performed and each of the three vein grafts were anastomosed to the  ascending aorta.  Air was evacuated from the grafts and the partial  occlusion clamp was removed.  The sites of anastomosis were inspected and  were free of bleeding.  The patient was then ventilated and weaned from  cardiopulmonary bypass without difficulty.  He remained hemodynamically  stable, was decannulated in the usual fashion.  Protamine sulfate was  administered.  With the operative field hemostatic, two atrial and two  ventricular pacing wires were applied.  Graft markers were applied.  A left  pleural tube and two mediastinal tubes were left in place.  Sternum was  closed with #6 stainless steel wire.  Fascia closed with interrupted 0 Vicryl, running 3-0 Vicryl in the subcutaneous tissues and 4-0  subcuticular  stitch in the skin edges.  Dry dressings were applied.  Sponge and needle  counts were reported as correct at the completion of the procedure.  The  patient tolerated the procedure without obvious complication and was  transferred to the surgical intensive care unit for further postoperative  care.  Total pump time was 136 minutes.  The patient did not require any  blood bank blood products during the operative procedure.  Gwenith Daily Tyrone Sage, M.D.    Tyson Babinski  D:  01/10/2003  T:  01/11/2003  Job:  [id number]   cc:   Arturo Morton. Riley Kill, M.D. [facility]

## 2011-04-03 NOTE — Op Note (Signed)
NAME:  Elijah Santana, Elijah Santana                 ACCOUNT NO.:  [id number]   MEDICAL RECORD NO.:  [id number]          PATIENT TYPE:  AMB   LOCATION:  DAY                           FACILITY:  [facility]   PHYSICIAN:  R. Roetta Sessions, M.D. DATE OF BIRTH:  [birth year]   DATE OF PROCEDURE:  09/29/2004  DATE OF DISCHARGE:                                 OPERATIVE REPORT   PROCEDURE:  Screening colonoscopy.   INDICATION FOR PROCEDURE:  The patient is a 77 year old gentleman referred  for colorectal cancer screening out of courtesy of Dr. Sherril Croon in [place], [place].  He has no GI symptoms.  He has never had a colonoscopy.  There is  no family history of colorectal neoplasia.  Colonoscopy is now being done as  a standard screening maneuver.  This approach along with the potential  risks, benefits, and alternatives had been fully explained to [name].  His  questions were answered, he is agreeable.  Please see documentation in the  medical record.   PROCEDURE NOTE:  O2 saturation, blood pressure, pulse, and respirations were  monitored throughout the entire procedure.   CONSCIOUS SEDATION:  Versed 3 mg IV, Demerol 75 mg in divided doses.   INSTRUMENT USED:  Olympus video chip system.   FINDINGS:  Digital rectal exam revealed no abnormalities.   Endoscopic findings:  The prep was good.   Rectum:  Examination of the rectal mucosa, including retroflexed view of the  anal verge, revealed no abnormalities.   Colon:  The colonic mucosa was surveyed from the rectosigmoid junction  through the left, transverse, and right colon, to the area of the  appendiceal orifice, ileocecal valve, and cecum.  These structures were well-  seen and photographed for the record.  From this level the scope was slowly  withdrawn.  All previously-mentioned mucosal surfaces were again seen.  The  colonic mucosa appeared normal.  The patient tolerated the procedure well,  was reacted in endoscopy.   IMPRESSION:  1.  Normal  rectum.  2.  Normal colon.   RECOMMENDATIONS:  Repeat colonoscopy in 10 years.     Otelia Sergeant   RMR/MEDQ  D:  09/29/2004  T:  09/29/2004  Job:  [id number]   cc:   Doreen Beam  [address]  [place]  [place] [address]  Fax: [phone]

## 2011-04-03 NOTE — Discharge Summary (Signed)
NAME:  Elijah Santana, Elijah Santana                           ACCOUNT NO.:  [id number]   MEDICAL RECORD NO.:  [id number]                   PATIENT TYPE:  OIB   LOCATION:  4706                                 FACILITY:  [facility]   PHYSICIAN:  Elijah Santana, M.D. [facility]         DATE OF BIRTH:  03/15/1932   DATE OF ADMISSION:  01/02/2003  DATE OF DISCHARGE:  01/03/2003                           DISCHARGE SUMMARY - REFERRING   PROCEDURES:  1. Cardiac catheterization.  2. Coronary arteriogram.  3. Left ventriculogram.   HOSPITAL COURSE:  Elijah Santana is a 77 year old male with a history of  myocardial infarction and stents many years ago but no recent cardiac  assessment for greater than five years.  He had some vague burning on his  chest and dyspnea on exertion and was evaluated in the office by Dr.  Diona Santana.  It was felt that cardiac catheterization was indicated and he was  admitted for this on January 02, 2003.   The cardiac catheterization showed a normal left main and LAD with an 80%  proximal stenosis.  There was no significant in stent restenosis however,  the diagonal take off at the stent was totalled.  The LAD also had a 60% mid  and 50% distal stenosis with the more distal portion being totalled.  The  first diagonal was totalled at the site of the stent and the second diagonal  had a 90% stenosis.  The circumflex was totalled in the mid portion with  right to left collaterals.  The RCA had a 30% to 50% proximal stenosis and  the PDA had an 80% stenosis.  His EF was approximately 45%.  A CVTS consult  was called.   Elijah Santana had carotid Dopplers as part of his pre-CABG work up.  There was  minimal plaque noted on the right and on the left there was some intimal  wall changes with mild heterogenous plaque but no stenosis.  Vertebral flow  was antegrade bilaterally.  Right velocities were increased in the ECA and  left increased velocities in the CCA.  Right Doppler was normal but left  Doppler decreased to greater than 50% with ulnar compression.   The patient is scheduled to be seen by CVTS and pending completion of their  evaluation he is tentatively discharged from the hospital to a leave of  absence with returning for surgery.  He is to be continued on his home  medications with modification in his activity level and we will add nitrates  to his medication regimen if he continues to have symptoms.   LABORATORY DATA:  Hemoglobin 13.3, hematocrit 37.6, WBC 5.1, platelets 27.  Sodium 144, potassium 3.8, chloride 106, CO2 28, BUN 11, creatinine 0.9,  glucose 101.   CONDITION ON DISCHARGE:  Stable.   DISCHARGE DIAGNOSES:  1. Anginal pain, CVTS evaluating.  2. Left ventricular dysfunction with an ejection fraction of 45% by  catheterization this admission.  3. Hyperlipidemia.  4. Hypertension.  5. History of myocardial infarction in the past with at least one stent seen     to the LAD by catheterization this admission.   DISCHARGE INSTRUCTIONS:  His activity level is to include no strenuous  activity.  He is to stick to a low fat diet.  He is to return for surgery  per CVTS.   DISCHARGE MEDICATIONS:  1. Lopressor 60 mg one half tablet b.i.d.  2. Multivitamins daily.  3. Pravachol 40 mg daily.  4. Cardura 4 mg q.h.s.  5. Aspirin 325 mg daily.     Elijah Santana, P.A. [facility]                  Elijah Santana, M.D. [facility]    RG/MEDQ  D:  01/03/2003  T:  01/03/2003  Job:  [id number]   cc:   [facility]  [facility]   Elijah Santana, M.D.  [facility]  [place]

## 2011-04-03 NOTE — Cardiovascular Report (Signed)
NAME:  Elijah Santana, Elijah Santana NO.:  [id number]   MEDICAL RECORD NO.:  [id number]                   PATIENT TYPE:  OIB   LOCATION:  2899                                 FACILITY:  [facility]   PHYSICIAN:  Arturo Morton. Riley Kill, M.D. [facility]         DATE OF BIRTH:  09-Mar-1932   DATE OF PROCEDURE:  01/02/2003  DATE OF DISCHARGE:                              CARDIAC CATHETERIZATION   PROCEDURE PERFORMED:  1. Left heart catheterization.  2. Selective coronary arteriography.  3. Selective left ventriculography.  4. Subclavian angiography.   CARDIOLOGIST:  Arturo Morton. Riley Kill, M.D.   INDICATIONS FOR PROCEDURE:  The patient is a pleasant 77 year old gentleman  who presented with chest pain and some known underlying coronary artery  disease.  The last intervention was in 1999.  He now has some recurrent  symptoms and is brought back to the lab for further evaluation.   DESCRIPTION OF PROCEDURE:  The procedure was performed via the right femoral  artery using 6 French catheters.  He tolerated the procedure well and there  were no complications.  He was hypotensive and was therefore given labetalol  times two.  He was taken to the holding area in satisfactory clinical  condition.   I reviewed the films with his wife of 49 years.   HEMODYNAMIC DATA:  1. Central aortic pressure 192/97, mean 132.  2. Left ventricular pressure 187/7/20.  3. No gradient on pullback across the aortic valve.   ANGIOGRAPHIC DATA:  1. Ventriculography was performed in the RAO projection.  The overall     systolic function appeared to be well preserved.  Because of ventricular     ectopy an accurate ejection fraction could not be calculated; however, it     appeared to be in the range of about 45%  and there appeared to be an     inferobasal as well as mid anterolateral wall hypokinesis areas.   1. The left main coronary artery was free of critical disease.   1. The left anterior descending  artery demonstrated about an 80% proximal     stenosis, which has progressed from the previous study.  At the previous     site of stenting there was no significant stenosis; however, there is a     diagonal that is missing and filling by retrograde collaterals.  There is     about a 60% area of hypodense narrowing prior to a septal and then 50%     prior to a second diagonal takeoff.  The second diagonal takeoff has     about 90% narrowing.  This then fills a retrograde first diagonal by     collaterals.  The distal LAD is totally occluded and previously did wrap     the apex on the old films.   1. The circumflex provides a large marginal branch that free of critical     disease.  The  distal circumflex is totally occluded.  The distal     circumflex fills by collaterals from the right coronary circulation.   1. The right coronary artery is diffusely irregular.  There is about 30-50%     narrowing proximally followed by a lot of luminal irregularities.  The     PDA is diffusely diseased and is small.  There is about an 80% stenosis     prior to the posterolateral segment.  This posterolateral segment then     collateralizes the distal circumflex.   CONCLUSIONS:  1. Preserved overall left ventricular function with wall motion     abnormalities as noted above.  2. Since the previous study there is progression of disease in both the     proximal left anterior descending and in the distal left anterior     descending with now total occlusion distally.  3. Total occlusion of the circumflex, old, with retrograde collaterals.  4. No evidence of restenosis at the stent sites in the mid and distal right     coronary artery.   DISPOSITION:  This is a difficult situation.  There is not a visible distal  LAD although there is a fairly large mid LAD after the high-grade proximal  right.  There is also a fairly good size diagonal branch.  In addition,  there is a circumflex distally that is  totally occluded.  I do not know  whether they can get a graft into the posterolateral branch and I suspect  that they may not be able to.  We will need to discuss the various options  with the patient and his family.   Options do include an attempt at percutaneous intervention with medical  therapy or, perhaps, surgery.                                                 Arturo Morton. Riley Kill, M.D. [facility]    TDS/MEDQ  D:  01/02/2003  T:  01/02/2003  Job:  [id number]   cc:   Doreen Beam  [address]  [place]  [place] [address]  Fax: [phone]   [facility]   Jonelle Sidle, M.D. [facility]

## 2011-04-03 NOTE — Discharge Summary (Signed)
NAME:  Elijah Santana, Elijah Santana                           ACCOUNT NO.:  [id number]   MEDICAL RECORD NO.:  [id number]                   PATIENT TYPE:  INP   LOCATION:  2004                                 FACILITY:  [facility]   PHYSICIAN:  Gwenith Daily. Tyrone Sage, M.D.            DATE OF BIRTH:  1932/02/02   DATE OF ADMISSION:  01/09/2003  DATE OF DISCHARGE:  01/15/2003                                 DISCHARGE SUMMARY   ADMITTING DIAGNOSIS:  Three-vessel coronary artery disease.   PAST MEDICAL HISTORY:  1. Coronary artery disease, status post remote MI with stent placement on     three occasions according to the patient.  Current ejection fraction     unknown.  Last cardiac evaluation greater than five years ago.  2. Hypertension.  3. Dyslipidemia.   ALLERGIES:  This patient has no known drug allergies.   BRIEF HISTORY:  Mr. Sage is a 77 year old, Caucasian man.  He was followed  in the past by E. Graceann Congress, M.D. in [place].  He was evaluated at  the [facility] cardiology office on February 3 by Jonelle Sidle, M.D.  He presented with some complaints of vague burning in his  chest and dyspnea on exertion as well as chest discomfort when walking up  hills.  After examination of the patient, Dr. Diona Browner recommended  proceeding with a Cardiolite stress test.  The results of the stress test  led Dr. Diona Browner to recommend proceeding with a cardiac catheterization.  This was scheduled and performed on January 02, 2003, and revealed three-  vessel coronary artery disease including diffuse distal disease.  These  lesions were not amenable to PCI, therefore cardiac surgery consult was  requested.  He was evaluated later in the day by Ramon Dredge B. Tyrone Sage, M.D.  After examination of the patient with review of all the medical records  including the cardiac catheterization films, Dr. Tyrone Sage recommended  proceeding with coronary bypass.  Procedure risks and benefits were  discussed  with Mr. Dicenzo, and he agreed to proceed.  His surgery was  scheduled electively for Tuesday, January 09, 2003, and the patient was  discharged home on leave of absence.  Prior to his discharge, preoperative  Doppler studies were performed which revealed no significant carotid artery  disease.  He was noted to have palpable pedal pulses bilaterally.   HOSPITAL COURSE:  On January 09, 2003, Mr. Chui was electively admitted to  [facility] in the care of [place] B. Tyrone Sage, M.D.  He underwent  the following surgical procedure.  Coronary artery bypass grafting x 4.  Grafts placed at the time of procedure were a left internal mammary artery  graft into the left anterior descending artery, saphenous vein is grafted to  the diagonal artery, saphenous vein is grafted to the distal circumflex  artery, saphenous vein is grafted to the distal right coronary artery.  Vein  was harvested from the right thigh via the Endovein harvesting technique as  well as the right lower leg with an open harvest technique.  He tolerated  this procedure well and was transferred in stable condition to the SICU.  He  remained hemodynamically stable in the postoperative period and was  extubated several hours after arrival.   Mr. Siedlecki postoperative course has been notable only for an episode of  atrial fibrillation on the evening of postoperative day three.  This episode  was rapid atrial fibrillation with a rate of 130-160's.  Did not respond to  Lopressor.  Dr. Tyrone Sage started IV amiodarone.  He converted to normal  sinus rhythm several hours later.  He has remained in sinus rhythm since  that time.  The amiodarone was changed from IV to p.o. dosing on the evening  of postoperative day four.   Overall, [name] is making very good progress in recovering from his  surgery.  On the morning of February 27, that is postoperative day four, he  reports feeling very well.  As stated above, his heart is in  normal sinus  rhythm.  Lungs had some decreased breath sounds at the bases.  No wheezes or  crackles were noted.  He was still mildly volume overloaded and responded to  diuretics.  He has no GI complaints, and his urine output is adequate.  His  incisions are healing well.  He does have mild lower extremity edema.  He is  ambulating slowly with assistance; his pain control is adequate.  Mr. Schroepfer  is making reasonable progress in recovering from his surgery.  If he  continues on this course and maintains normal sinus rhythm, it is  anticipated he will be ready for discharge home on Sunday, January 15, 2003.   Recent laboratory studies on February 27:  CBC revealed white blood cells  6.6, hemoglobin 9.4, hematocrit 26.6, platelets 129.  Chemistries include a  sodium at 136, potassium 3.7, BUN 16, creatinine 0.8, calcium 7.6.   CONDITION ON DISCHARGE:  Improved.   DISCHARGE MEDICATIONS:  1. Tylox 1-2 p.o. q.4-6h. p.r.n. for moderate to severe pain or Tylenol 325     mg 1-2 p.o. q.4-6h. p.r.n. for mild pain.  2. Amiodarone 200 mg p.o. b.i.d.  It is anticipated this dose will be     decreased at his two week appointment with his cardiologist.  3. Folic acid 1 mg p.o. daily.  4. Colace 200 mg p.o. daily.   HOME MEDICATIONS:  He has been instructed to resume the following home  medications:  1. Lopressor 25 mg p.o. b.i.d.  2. Pravachol 40 mg p.o. evening.  3. Cardura 4 mg p.o. q.h.s.  4. Enteric-coated aspirin 325 mg p.o. daily.   ACTIVITY:  1. He has been asked to refrain from any driving or any heavy lifting,     pushing or pulling.  2. He has been instructed to keep doing his breathing exercises and daily     walking.   DIET:  His diet should be maintained at a low fat, low salt diet.   WOUND CARE:  He may shower with mild soap and water.  If his wound is red,  hot, swollen, draining or if he has a fever greater than 101 degrees F, he is to call Edward B. Tyrone Sage, M.D.'s  office.    FOLLOW UP:  1. Jonelle Sidle, M.D. should see him in the [facility] in     approximately  two weeks.  He has been asked to call to arrange that     appointment.  He will have a chest x-ray that day.  2. Gwenith Daily. Tyrone Sage, M.D. would like to see him at the [facility] office on     Thursday, February 08, 2003, at noon.     Toribio Harbour, R.N.                  Gwenith Daily. Tyrone Sage, M.D.    CTK/MEDQ  D:  01/12/2003  T:  01/12/2003  Job:  [id number]   cc:   Jonelle Sidle, M.D. [facility]   Dhruv Vyas  [address]  [place]  [place] [address]  Fax: [phone]

## 2011-04-07 ENCOUNTER — Ambulatory Visit (INDEPENDENT_AMBULATORY_CARE_PROVIDER_SITE_OTHER): Payer: BC Managed Care – PPO | Admitting: Cardiology

## 2011-04-07 ENCOUNTER — Encounter: Payer: Self-pay | Admitting: Cardiology

## 2011-04-07 VITALS — BP 118/76 | HR 73 | Ht 67.0 in | Wt 223.0 lb

## 2011-04-07 DIAGNOSIS — E785 Hyperlipidemia, unspecified: Secondary | ICD-10-CM

## 2011-04-07 DIAGNOSIS — I10 Essential (primary) hypertension: Secondary | ICD-10-CM

## 2011-04-07 DIAGNOSIS — I2510 Atherosclerotic heart disease of native coronary artery without angina pectoris: Secondary | ICD-10-CM

## 2011-04-07 NOTE — Progress Notes (Signed)
Clinical Summary Elijah Santana is a 77 y.o.male presenting for followup. He was seen in February.   Followup lab work from May 21 showed AST 22, ALT 26, cholesterol 149, cholesterol is 110, HDL 37, LDL 90. We reviewed these today.  Actually, he states he has been feeling better over the last several weeks. No progressive chest pain or shortness of breath. Seems comfortable with observation and medical therapy.  No Known Allergies  Current outpatient prescriptions:amLODipine (NORVASC) 10 MG tablet, Take 10 mg by mouth daily.  , Disp: , Rfl: ;  aspirin 325 MG tablet, Take 325 mg by mouth daily.  , Disp: , Rfl: ;  calcium gluconate 500 MG tablet, Take 500 mg by mouth daily.  , Disp: , Rfl: ;  doxazosin (CARDURA) 2 MG tablet, Take 2 mg by mouth at bedtime.  , Disp: , Rfl: ;  fish oil-omega-3 fatty acids 1000 MG capsule, Take 2 g by mouth 2 (two) times daily.  , Disp: , Rfl:  isosorbide mononitrate (IMDUR) 60 MG 24 hr tablet, Take 60 mg by mouth daily.  , Disp: , Rfl: ;  metoprolol (LOPRESSOR) 50 MG tablet, Take 50 mg by mouth 2 (two) times daily.  , Disp: , Rfl: ;  nitroGLYCERIN (NITROSTAT) 0.4 MG SL tablet, Place 0.4 mg under the tongue every 5 (five) minutes as needed.  , Disp: , Rfl: ;  simvastatin (ZOCOR) 20 MG tablet, Take 20 mg by mouth at bedtime.  , Disp: , Rfl:  Tamsulosin HCl (FLOMAX) 0.4 MG CAPS, Take 0.4 mg by mouth daily.  , Disp: , Rfl: ;  vitamin E 400 UNIT capsule, Take 400 Units by mouth daily.  , Disp: , Rfl:   Past Medical History  Diagnosis Date  . Coronary atherosclerosis of native coronary artery     Multivessel, LVEF 55%  . Chronic back pain   . Mixed hyperlipidemia   . Essential hypertension, benign   . Myocardial infarction   . Prostate cancer     Radiation therapy 1996    Social History Elijah Santana reports that he has never smoked. He quit smokeless tobacco use about 7 months ago. His smokeless tobacco use included Chew. Elijah Santana reports that he does not drink  alcohol.  Review of Systems No palpitations or syncope. No reported bleeding problems. Otherwise reviewed and negative.  Physical Examination Filed Vitals:   04/07/11 1401  BP: 118/76  Pulse: 73    Additional Exam: Overweight male in no acute distress.  HEENT: Conjunctiva and lids normal, oropharynx with moist mucosa.  Neck: Supple, no elevated jugular venous pressure or bruits.  Lungs: Clear, diminished breath sounds, nonlabored.  Cardiac: Regular rate and rhythm, no S3.  Abdomen: Soft, nontender, bowel sounds present.  Extremities: No pitting edema, distal pulses 1-2+.  Skin: Warm and dry.  Musculoskeletal: No kyphosis.  Neuropsychiatric: Alert and oriented x3, affect appropriate.    Studies Cardiac catheterization 08/20/2009: RESULTS: Left anterior descending artery: The left anterior descending  artery was completely occluded near its origin.  Circumflex artery: The circumflex artery had a total occlusion of the  first marginal branch and then was totally occluded.  Right coronary artery: The right coronary artery was totally occluded  in its proximal portion.  The saphenous vein graft to the posterior descending artery had diffuse  30-40% narrowing in the proximal portion of the graft.  The LIMA graft to LAD was patent, but the LAD was total after the  insertion site, and the distal LAD  filled by collaterals.  The saphenous vein graft to the diagonal branch LAD was completely  occluded at its origin.  The saphenous vein graft to the posterolateral branch of the circumflex  artery was patent and filled in an obtuse marginal branch by  collaterals.  Left ventriculogram: The left ventriculogram performed in the RAO  projection showed anterolateral wall hypokinesis with an estimated  ejection fraction of 55%.   Problem List and Plan

## 2011-04-07 NOTE — Assessment & Plan Note (Signed)
Blood pressure well controlled at this time. No changes made to present regimen.

## 2011-04-07 NOTE — Assessment & Plan Note (Signed)
Recent lab work reviewed. Continue present regimen.

## 2011-04-07 NOTE — Patient Instructions (Signed)
Continue all current medications. Your physician wants you to follow up in: 6 months.  You will receive a reminder letter in the mail one-two months in advance.  If you don't receive a letter, please call our office to schedule the follow up appointment   

## 2011-04-07 NOTE — Assessment & Plan Note (Signed)
Relatively stable symptomatically. Encouraged regular walking regimen, medical therapy, observation at this point.

## 2011-04-08 ENCOUNTER — Ambulatory Visit: Payer: BC Managed Care – PPO | Admitting: Cardiology

## 2011-04-30 ENCOUNTER — Other Ambulatory Visit: Payer: Self-pay | Admitting: *Deleted

## 2011-04-30 MED ORDER — AMLODIPINE BESYLATE 10 MG PO TABS: 10.0000 mg | ORAL_TABLET | Freq: Every day | ORAL | Status: DC

## 2011-09-30 ENCOUNTER — Encounter: Payer: Self-pay | Admitting: Cardiology

## 2011-10-06 ENCOUNTER — Encounter: Payer: Self-pay | Admitting: Cardiology

## 2011-10-06 ENCOUNTER — Other Ambulatory Visit: Payer: Self-pay | Admitting: Cardiology

## 2011-10-06 ENCOUNTER — Ambulatory Visit (INDEPENDENT_AMBULATORY_CARE_PROVIDER_SITE_OTHER): Payer: BC Managed Care – PPO | Admitting: Cardiology

## 2011-10-06 VITALS — BP 126/74 | HR 69 | Ht 68.0 in | Wt 230.0 lb

## 2011-10-06 DIAGNOSIS — I10 Essential (primary) hypertension: Secondary | ICD-10-CM

## 2011-10-06 DIAGNOSIS — E785 Hyperlipidemia, unspecified: Secondary | ICD-10-CM

## 2011-10-06 DIAGNOSIS — I2510 Atherosclerotic heart disease of native coronary artery without angina pectoris: Secondary | ICD-10-CM

## 2011-10-06 NOTE — Assessment & Plan Note (Signed)
Lipids have been well controlled, and he has tolerated current dose of Zocor with normal LFTs and no other obvious side effects.

## 2011-10-06 NOTE — Assessment & Plan Note (Signed)
Blood pressure well-controlled today. 

## 2011-10-06 NOTE — Assessment & Plan Note (Addendum)
Symptomatically stable on medical therapy. Continue observation. Discussed warning signs. Recommended a walking regimen.

## 2011-10-06 NOTE — Patient Instructions (Signed)
Your physician wants you to follow-up in: 6 months. You will receive a reminder letter in the mail one-two months in advance. If you don't receive a letter, please call our office to schedule the follow-up appointment. Your physician recommends that you continue on your current medications as directed. Please refer to the Current Medication list given to you today. Your physician recommends that you go to the [facility] for a FASTING lipid profile and liver function labs. Do not eat or drink after midnight. DO IN 6 MONTHS BEFORE OFFICE VISIT.

## 2011-10-06 NOTE — Progress Notes (Signed)
Clinical Summary Elijah Santana is a 77 y.o.male presenting for followup. He was seen in May.  Lab work from September showed normal LFTs, triglycerides 166, cholesterol 132, HDL 37, LDL 62.  No reported change in chronic dyspnea on exertion, no angina symptoms. He does not exercise regularly. We have discussed a walking regimen.  Reports compliance with his medications. Blood pressure and lipids have been well controlled in general.  No Known Allergies  Medication list reviewed.  Past Medical History  Diagnosis Date  . Coronary atherosclerosis of native coronary artery     Multivessel, LVEF 55%  . Chronic back pain   . Mixed hyperlipidemia   . Essential hypertension, benign   . Myocardial infarction   . Prostate cancer     Radiation therapy 1996    Past Surgical History  Procedure Date  . Coronary artery bypass graft 2004    LIMA to LAD, SVG to diagonal, SVG to circumflex, SVG to PDA    Family History  Problem Relation Age of Onset  . Hypertension    . Coronary artery disease      Social History Elijah Santana reports that he has never smoked. He quit smokeless tobacco use about 13 months ago. His smokeless tobacco use included Chew. Elijah Santana reports that he does not drink alcohol.  Review of Systems No palpitations. No reported melena or hematochezia. Otherwise negative.  Physical Examination Filed Vitals:   10/06/11 0852  BP: 126/74  Pulse: 69    Overweight male in no acute distress.  HEENT: Conjunctiva and lids normal, oropharynx with moist mucosa.  Neck: Supple, no elevated jugular venous pressure or bruits.  Lungs: Clear, diminished breath sounds, nonlabored.  Cardiac: Regular rate and rhythm, no S3.  Abdomen: Soft, nontender, bowel sounds present.  Extremities: No pitting edema, distal pulses 1-2+.  Skin: Warm and dry.  Musculoskeletal: No kyphosis.  Neuropsychiatric: Alert and oriented x3, affect appropriate.   Problem List and Plan

## 2011-10-07 NOTE — Telephone Encounter (Signed)
**Note De-identified  Obfuscation** [place] pt. 

## 2011-11-18 ENCOUNTER — Encounter: Payer: Self-pay | Admitting: Physician Assistant

## 2011-11-18 ENCOUNTER — Other Ambulatory Visit: Payer: Self-pay | Admitting: Internal Medicine

## 2011-12-02 ENCOUNTER — Other Ambulatory Visit: Payer: Self-pay | Admitting: Cardiology

## 2011-12-04 ENCOUNTER — Ambulatory Visit (INDEPENDENT_AMBULATORY_CARE_PROVIDER_SITE_OTHER): Payer: BC Managed Care – PPO | Admitting: Urology

## 2011-12-04 DIAGNOSIS — C61 Malignant neoplasm of prostate: Secondary | ICD-10-CM

## 2011-12-04 DIAGNOSIS — N401 Enlarged prostate with lower urinary tract symptoms: Secondary | ICD-10-CM

## 2011-12-04 DIAGNOSIS — Z79899 Other long term (current) drug therapy: Secondary | ICD-10-CM

## 2012-01-21 ENCOUNTER — Other Ambulatory Visit: Payer: Self-pay | Admitting: *Deleted

## 2012-01-21 MED ORDER — AMLODIPINE BESYLATE 10 MG PO TABS: 10.0000 mg | ORAL_TABLET | Freq: Every day | ORAL | Status: DC

## 2012-01-21 MED ORDER — SIMVASTATIN 40 MG PO TABS: 40.0000 mg | ORAL_TABLET | Freq: Every day | ORAL | Status: DC

## 2012-01-21 MED ORDER — ISOSORBIDE MONONITRATE ER 60 MG PO TB24: 60.0000 mg | ORAL_TABLET | Freq: Every day | ORAL | Status: DC

## 2012-01-21 MED ORDER — METOPROLOL TARTRATE 50 MG PO TABS: 50.0000 mg | ORAL_TABLET | Freq: Two times a day (BID) | ORAL | Status: DC

## 2012-01-21 NOTE — Telephone Encounter (Signed)
Addended by: Marrion Coy L on: 01/21/2012 01:52 PM   Modules accepted: Orders

## 2012-03-14 ENCOUNTER — Other Ambulatory Visit: Payer: Self-pay | Admitting: *Deleted

## 2012-03-14 DIAGNOSIS — E782 Mixed hyperlipidemia: Secondary | ICD-10-CM

## 2012-03-14 DIAGNOSIS — Z79899 Other long term (current) drug therapy: Secondary | ICD-10-CM

## 2012-04-01 ENCOUNTER — Ambulatory Visit (INDEPENDENT_AMBULATORY_CARE_PROVIDER_SITE_OTHER): Payer: BC Managed Care – PPO | Admitting: Urology

## 2012-04-01 DIAGNOSIS — N401 Enlarged prostate with lower urinary tract symptoms: Secondary | ICD-10-CM

## 2012-04-01 DIAGNOSIS — C61 Malignant neoplasm of prostate: Secondary | ICD-10-CM

## 2012-04-07 ENCOUNTER — Ambulatory Visit (INDEPENDENT_AMBULATORY_CARE_PROVIDER_SITE_OTHER): Payer: BC Managed Care – PPO | Admitting: Cardiology

## 2012-04-07 ENCOUNTER — Telehealth: Payer: Self-pay | Admitting: *Deleted

## 2012-04-07 ENCOUNTER — Encounter: Payer: Self-pay | Admitting: Cardiology

## 2012-04-07 VITALS — BP 112/69 | HR 72 | Ht 68.0 in | Wt 223.8 lb

## 2012-04-07 DIAGNOSIS — I2510 Atherosclerotic heart disease of native coronary artery without angina pectoris: Secondary | ICD-10-CM

## 2012-04-07 DIAGNOSIS — E785 Hyperlipidemia, unspecified: Secondary | ICD-10-CM

## 2012-04-07 DIAGNOSIS — I10 Essential (primary) hypertension: Secondary | ICD-10-CM

## 2012-04-07 NOTE — Patient Instructions (Signed)
Your physician recommends that you schedule a follow-up appointment in: 6 months. You will receive a reminder letter in the mail in about 4 months reminding you to call and schedule your appointment. If you don't receive this letter, please contact our office.   Your physician recommends that you return for a FASTING lipid/liver profile: in 6 months just before your next visit.  Your physician recommends that you continue on your current medications as directed. Please refer to the Current Medication list given to you today.

## 2012-04-07 NOTE — Assessment & Plan Note (Signed)
Symptomatically stable and medical therapy. Continue observation and followup.

## 2012-04-07 NOTE — Assessment & Plan Note (Signed)
Blood pressure control is good today. No changes made. 

## 2012-04-07 NOTE — Assessment & Plan Note (Signed)
Reinforced compliance with medication and also diet. Followup FLP and LFT for next visit.

## 2012-04-07 NOTE — Progress Notes (Signed)
Clinical Summary Elijah Santana is a 78 y.o.male presenting for followup. Who was seen in November 2012. He states that he has had trouble with pneumonia over the winter, also allergy problems during the pollen season. Just now getting back to baseline. He has not been doing any regular walking due to this. No active angina however.  Recent followup lab work shows normal LFTs, triglycerides 97, cholesterol 166, HDL 45, LDL increased up to 102. We discussed this. He reports medication compliance, although his weight has increased. We reviewed diet.   No Known Allergies  Current Outpatient Prescriptions  Medication Sig Dispense Refill  . amLODipine (NORVASC) 10 MG tablet Take 1 tablet (10 mg total) by mouth daily.  90 tablet  3  . aspirin 325 MG tablet Take 325 mg by mouth daily.        . isosorbide mononitrate (IMDUR) 60 MG 24 hr tablet Take 1 tablet (60 mg total) by mouth daily.  90 tablet  3  . megestrol (MEGACE) 40 MG tablet Take 20 mg by mouth 2 (two) times daily.       . metoprolol (LOPRESSOR) 50 MG tablet Take 1 tablet (50 mg total) by mouth 2 (two) times daily.  180 tablet  3  . NITROSTAT 0.4 MG SL tablet PLACE ONE (1) TABLET UNDER TONGUE EVERY 5 MINUTES UP TO (3) DOSES AS NEEDED FOR CHEST PAIN.  25 each  2  . simvastatin (ZOCOR) 40 MG tablet Take 1 tablet (40 mg total) by mouth at bedtime.  90 tablet  3  . SYMBICORT 160-4.5 MCG/ACT inhaler Inhale 2 puffs into the lungs as needed.       . Tamsulosin HCl (FLOMAX) 0.4 MG CAPS Take 0.4 mg by mouth daily.        . VENTOLIN HFA 108 (90 BASE) MCG/ACT inhaler Inhale 1 puff into the lungs as needed.       Marland Kitchen DISCONTD: isosorbide mononitrate (IMDUR) 60 MG 24 hr tablet Take 1 tablet (60 mg total) by mouth daily.  90 tablet  2  . DISCONTD: metoprolol (LOPRESSOR) 50 MG tablet Take 1 tablet (50 mg total) by mouth 2 (two) times daily.  180 tablet  2  . DISCONTD: simvastatin (ZOCOR) 40 MG tablet Take 1 tablet (40 mg total) by mouth at bedtime.  90 tablet   2    Past Medical History  Diagnosis Date  . Coronary atherosclerosis of native coronary artery     Multivessel, LVEF 55%  . Chronic back pain   . Mixed hyperlipidemia   . Essential hypertension, benign   . Myocardial infarction   . Prostate cancer     Radiation therapy 1996    Past Surgical History  Procedure Date  . Coronary artery bypass graft 2004    LIMA to LAD, SVG to diagonal, SVG to circumflex, SVG to PDA    Social History Elijah Santana reports that he has never smoked. He quit smokeless tobacco use about 20 months ago. His smokeless tobacco use included Chew. Elijah Santana reports that he does not drink alcohol.  Review of Systems No palpitations or falls. No orthopnea or PND. No edema. Otherwise negative.  Physical Examination Filed Vitals:   04/07/12 1329  BP: 112/69  Pulse: 72    Overweight male in no acute distress.  HEENT: Conjunctiva and lids normal, oropharynx with moist mucosa.  Neck: Supple, no elevated jugular venous pressure or bruits.  Lungs: Clear, diminished breath sounds, nonlabored.  Cardiac: Regular rate and rhythm, no  S3.  Abdomen: Soft, nontender, bowel sounds present.  Extremities: No pitting edema, distal pulses 1-2+.    Problem List and Plan   CORONARY ATHEROSCLEROSIS NATIVE CORONARY ARTERY Symptomatically stable and medical therapy. Continue observation and followup.  Essential hypertension, benign Blood pressure control is good today. No changes made.  DYSLIPIDEMIA Reinforced compliance with medication and also diet. Followup FLP and LFT for next visit.     Jonelle Sidle, M.D., F.A.C.C.

## 2012-04-07 NOTE — Telephone Encounter (Signed)
Message copied by Eustace Moore on Thu Apr 07, 2012  4:50 PM ------      Message from: MCDOWELL, Illene Bolus      Created: Wed Apr 06, 2012  9:22 AM       LFTs normal. LDL has come up from the 60s to 102.

## 2012-04-07 NOTE — Telephone Encounter (Signed)
Patient informed during office visit today. 

## 2012-04-28 ENCOUNTER — Emergency Department (HOSPITAL_COMMUNITY): Payer: Medicare Other

## 2012-04-28 ENCOUNTER — Encounter (HOSPITAL_COMMUNITY): Payer: Self-pay | Admitting: *Deleted

## 2012-04-28 ENCOUNTER — Observation Stay (HOSPITAL_COMMUNITY)
Admission: EM | Admit: 2012-04-28 | Discharge: 2012-04-29 | Disposition: A | Discharge status: Home / Self Care | Payer: Medicare Other | Attending: Internal Medicine | Admitting: Internal Medicine

## 2012-04-28 DIAGNOSIS — K219 Gastro-esophageal reflux disease without esophagitis: Secondary | ICD-10-CM

## 2012-04-28 DIAGNOSIS — R05 Cough: Secondary | ICD-10-CM

## 2012-04-28 DIAGNOSIS — R053 Chronic cough: Secondary | ICD-10-CM

## 2012-04-28 DIAGNOSIS — Z951 Presence of aortocoronary bypass graft: Secondary | ICD-10-CM | POA: Insufficient documentation

## 2012-04-28 DIAGNOSIS — I10 Essential (primary) hypertension: Secondary | ICD-10-CM

## 2012-04-28 DIAGNOSIS — E785 Hyperlipidemia, unspecified: Secondary | ICD-10-CM

## 2012-04-28 DIAGNOSIS — Z6833 Body mass index (BMI) 33.0-33.9, adult: Secondary | ICD-10-CM | POA: Insufficient documentation

## 2012-04-28 DIAGNOSIS — R079 Chest pain, unspecified: Principal | ICD-10-CM

## 2012-04-28 DIAGNOSIS — R0602 Shortness of breath: Secondary | ICD-10-CM | POA: Insufficient documentation

## 2012-04-28 DIAGNOSIS — I2510 Atherosclerotic heart disease of native coronary artery without angina pectoris: Secondary | ICD-10-CM

## 2012-04-28 DIAGNOSIS — M549 Dorsalgia, unspecified: Secondary | ICD-10-CM

## 2012-04-28 DIAGNOSIS — G8929 Other chronic pain: Secondary | ICD-10-CM

## 2012-04-28 DIAGNOSIS — E669 Obesity, unspecified: Secondary | ICD-10-CM

## 2012-04-28 DIAGNOSIS — E782 Mixed hyperlipidemia: Secondary | ICD-10-CM

## 2012-04-28 DIAGNOSIS — R059 Cough, unspecified: Secondary | ICD-10-CM | POA: Insufficient documentation

## 2012-04-28 LAB — POCT I-STAT, CHEM 8
BUN: 21 mg/dL (ref 6–23)
Chloride: 109 mEq/L (ref 96–112)
Creatinine, Ser: 1 mg/dL (ref 0.50–1.35)
Glucose, Bld: 148 mg/dL — ABNORMAL HIGH (ref 70–99)
Potassium: 3.9 mEq/L (ref 3.5–5.1)

## 2012-04-28 LAB — CBC
Hemoglobin: 12.2 g/dL — ABNORMAL LOW (ref 13.0–17.0)
MCHC: 34 g/dL (ref 30.0–36.0)

## 2012-04-28 MED ORDER — ASPIRIN 81 MG PO CHEW
324.0000 mg | CHEWABLE_TABLET | Freq: Once | ORAL | Status: AC
  Administered 2012-04-28: 324 mg via ORAL
  Filled 2012-04-28: qty 4

## 2012-04-28 MED ORDER — NITROGLYCERIN 0.4 MG/HR TD PT24
0.4000 mg | MEDICATED_PATCH | Freq: Once | TRANSDERMAL | Status: DC
  Administered 2012-04-28: 0.4 mg via TRANSDERMAL
  Filled 2012-04-28: qty 1

## 2012-04-28 NOTE — ED Provider Notes (Signed)
History   This chart was scribed for Sunnie Nielsen, MD by Brooks Sailors. The patient was seen in room APA02/APA02. Patient's care was started at 2307.   CSN: [id number]  Arrival date & time 04/28/12  2307   First MD Initiated Contact with Patient 04/28/12 2324      Chief Complaint  Patient presents with  . Chest Pain    (Consider location/radiation/quality/duration/timing/severity/associated sxs/prior treatment) HPI  Elijah Santana is a 78 y.o. male who presents to the Emergency Department complaining of chest tightness onset a few hours ago that lasted about 30 minutes. Associated SOB and cough. Says pain did not radiate described as 5/10 and feels like a pulled muscle in his chest, worse when coughing. Pt says the pain was slightly relieved by one NTG. Pt with history of MI, CABG 14 years ago and 3-4 stents. Says the tightness felt similar to his heart pain 40 something years ago described as burning and indigestion type. Denies leg pain and leg swelling. Pain resolved by the time PT arrives to ED without known alleviating factors. No fevers chills.  Cardiologist Diona Browner with [facility]   Past Medical History  Diagnosis Date  . Coronary atherosclerosis of native coronary artery     Multivessel, LVEF 55%  . Chronic back pain   . Mixed hyperlipidemia   . Essential hypertension, benign   . Myocardial infarction   . Prostate cancer     Radiation therapy 1996    Past Surgical History  Procedure Date  . Coronary artery bypass graft 2004    LIMA to LAD, SVG to diagonal, SVG to circumflex, SVG to PDA  . Coronary stent placement     Family History  Problem Relation Age of Onset  . Hypertension    . Coronary artery disease      History  Substance Use Topics  . Smoking status: Never Smoker   . Smokeless tobacco: Former Neurosurgeon    Types: Chew    Quit date: 08/07/2010   Comment: never chewed up over a pack/day  . Alcohol Use: No      Review of Systems  Respiratory: Positive  for cough, chest tightness and shortness of breath.   All other systems reviewed and are negative.    Allergies  Review of patient's allergies indicates no known allergies.  Home Medications   Current Outpatient Rx  Name Route Sig Dispense Refill  . AMLODIPINE BESYLATE 10 MG PO TABS Oral Take 1 tablet (10 mg total) by mouth daily. 90 tablet 3  . ASPIRIN 325 MG PO TABS Oral Take 325 mg by mouth daily.      . ISOSORBIDE MONONITRATE ER 60 MG PO TB24 Oral Take 1 tablet (60 mg total) by mouth daily. 90 tablet 3  . MEGESTROL ACETATE 40 MG PO TABS Oral Take 20 mg by mouth 2 (two) times daily.     Marland Kitchen METOPROLOL TARTRATE 50 MG PO TABS Oral Take 1 tablet (50 mg total) by mouth 2 (two) times daily. 180 tablet 3  . NITROSTAT 0.4 MG SL SUBL  PLACE ONE (1) TABLET UNDER TONGUE EVERY 5 MINUTES UP TO (3) DOSES AS NEEDED FOR CHEST PAIN. 25 each 2  . SIMVASTATIN 40 MG PO TABS Oral Take 1 tablet (40 mg total) by mouth at bedtime. 90 tablet 3  . SYMBICORT 160-4.5 MCG/ACT IN AERO Inhalation Inhale 2 puffs into the lungs as needed.     Marland Kitchen TAMSULOSIN HCL 0.4 MG PO CAPS Oral Take 0.4 mg by  mouth daily.      . VENTOLIN HFA 108 (90 BASE) MCG/ACT IN AERS Inhalation Inhale 1 puff into the lungs as needed.       BP 137/85  Pulse 87  Temp 98 F (36.7 C) (Oral)  Resp 20  Ht 5\' 8"  (1.727 m)  Wt 217 lb (98.431 kg)  BMI 32.99 kg/m2  SpO2 96%  Physical Exam  Nursing note and vitals reviewed. Constitutional: He is oriented to person, place, and time. He appears well-developed and well-nourished. No distress.  HENT:  Head: Normocephalic and atraumatic.  Eyes: Conjunctivae and EOM are normal. Pupils are equal, round, and reactive to light.  Neck: Normal range of motion. Neck supple.  Cardiovascular: Normal rate and regular rhythm.   Pulmonary/Chest: Effort normal and breath sounds normal. No respiratory distress.       Dry intermittent cough on exam  Abdominal: Soft. Bowel sounds are normal.  Musculoskeletal:  Normal range of motion. He exhibits no edema.  Neurological: He is alert and oriented to person, place, and time. No sensory deficit.  Skin: Skin is warm and dry.  Psychiatric: He has a normal mood and affect. His behavior is normal.    ED Course  Procedures (including critical care time) DIAGNOSTIC STUDIES: Oxygen Saturation is 96% on room air, normal by my interpretation.    COORDINATION OF CARE: 2328 Patient informed of current plan for treatment and evaluation and agrees with plan at this time.     Date: 04/29/2012  Rate: 85  Rhythm: normal sinus rhythm  QRS Axis: normal  Intervals: normal  ST/T Wave abnormalities: nonspecific ST changes  Conduction Disutrbances:none  Narrative Interpretation: previous EKG dated 01/10/2003 demonstrates diffuse ST elevations with PR depressions.   Old EKG Reviewed: changes noted  IV. O2. Monitor. Aspirin. Nitroglycerin paste for chest pain resolved.   MDM   Chest pain in 78 year old male with history of coronary artery disease. No acute changes on EKG. Concern for possible ACS. Labs and imaging obtained and reviewed as above. Pain-free and emergency department. Plan admission for further cardiac evaluation.   12:48 AM patient remains chest pain-free in the emergency department. Case discussed as above with hospitalist Dr. Orvan Falconer who agrees to admission.   I personally performed the services described in this documentation, which was scribed in my presence. The recorded information has been reviewed and considered.     Sunnie Nielsen, MD 04/29/12 [phone]

## 2012-04-28 NOTE — ED Notes (Signed)
Chest worse tonight feels like indigestion

## 2012-04-28 NOTE — ED Notes (Signed)
Pt reported a burning feeling to the center of his chest like indigestion. Pt w/ cardiac history. Pt took 1 nitro at home. Pt states chronic cough for the last 6 months.

## 2012-04-29 ENCOUNTER — Encounter (HOSPITAL_COMMUNITY): Payer: Self-pay | Admitting: Internal Medicine

## 2012-04-29 DIAGNOSIS — R079 Chest pain, unspecified: Secondary | ICD-10-CM

## 2012-04-29 DIAGNOSIS — K219 Gastro-esophageal reflux disease without esophagitis: Secondary | ICD-10-CM | POA: Diagnosis present

## 2012-04-29 DIAGNOSIS — I10 Essential (primary) hypertension: Secondary | ICD-10-CM

## 2012-04-29 DIAGNOSIS — E669 Obesity, unspecified: Secondary | ICD-10-CM | POA: Diagnosis present

## 2012-04-29 DIAGNOSIS — I2510 Atherosclerotic heart disease of native coronary artery without angina pectoris: Secondary | ICD-10-CM

## 2012-04-29 DIAGNOSIS — G8929 Other chronic pain: Secondary | ICD-10-CM | POA: Diagnosis present

## 2012-04-29 DIAGNOSIS — R05 Cough: Secondary | ICD-10-CM | POA: Diagnosis present

## 2012-04-29 DIAGNOSIS — E782 Mixed hyperlipidemia: Secondary | ICD-10-CM | POA: Insufficient documentation

## 2012-04-29 DIAGNOSIS — R053 Chronic cough: Secondary | ICD-10-CM | POA: Diagnosis present

## 2012-04-29 LAB — HEPATIC FUNCTION PANEL
ALT: 15 U/L (ref 0–53)
AST: 11 U/L (ref 0–37)
Alkaline Phosphatase: 43 U/L (ref 39–117)
Total Protein: 6.3 g/dL (ref 6.0–8.3)

## 2012-04-29 LAB — CARDIAC PANEL(CRET KIN+CKTOT+MB+TROPI)
CK, MB: 2.1 ng/mL (ref 0.3–4.0)
CK, MB: 2.2 ng/mL (ref 0.3–4.0)
Relative Index: 1.7 (ref 0.0–2.5)
Total CK: 113 U/L (ref 7–232)
Total CK: 127 U/L (ref 7–232)
Troponin I: <0.3 ng/mL (ref <0.30)

## 2012-04-29 LAB — COMPREHENSIVE METABOLIC PANEL
ALT: 16 U/L (ref 0–53)
AST: 13 U/L (ref 0–37)
Albumin: 3.8 g/dL (ref 3.5–5.2)
Alkaline Phosphatase: 47 U/L (ref 39–117)
GFR calc Af Amer: 79 mL/min — ABNORMAL LOW (ref >90)
Glucose, Bld: 146 mg/dL — ABNORMAL HIGH (ref 70–99)
Potassium: 4.2 mEq/L (ref 3.5–5.1)
Sodium: 140 mEq/L (ref 135–145)
Total Protein: 6.9 g/dL (ref 6.0–8.3)

## 2012-04-29 LAB — PROTIME-INR
INR: 1.13 (ref 0.00–1.49)
Prothrombin Time: 14.7 seconds (ref 11.6–15.2)

## 2012-04-29 LAB — CBC
HCT: 33.8 % — ABNORMAL LOW (ref 39.0–52.0)
Hemoglobin: 11.6 g/dL — ABNORMAL LOW (ref 13.0–17.0)
MCHC: 34.3 g/dL (ref 30.0–36.0)
MCV: 88.7 fL (ref 78.0–100.0)
RDW: 13.7 % (ref 11.5–15.5)

## 2012-04-29 LAB — BASIC METABOLIC PANEL
Calcium: 9.3 mg/dL (ref 8.4–10.5)
GFR calc non Af Amer: 80 mL/min — ABNORMAL LOW (ref >90)
Glucose, Bld: 106 mg/dL — ABNORMAL HIGH (ref 70–99)
Sodium: 141 mEq/L (ref 135–145)

## 2012-04-29 LAB — TSH: TSH: 2.485 u[IU]/mL (ref 0.350–4.500)

## 2012-04-29 MED ORDER — POTASSIUM CHLORIDE IN NACL 20-0.9 MEQ/L-% IV SOLN
INTRAVENOUS | Status: DC
  Administered 2012-04-29: 03:00:00 via INTRAVENOUS

## 2012-04-29 MED ORDER — ISOSORBIDE MONONITRATE ER 120 MG PO TB24: 120.0000 mg | ORAL_TABLET | Freq: Every day | ORAL | Status: DC

## 2012-04-29 MED ORDER — TRAZODONE HCL 50 MG PO TABS: 25.0000 mg | ORAL_TABLET | Freq: Every evening | ORAL | Status: DC | PRN

## 2012-04-29 MED ORDER — ONDANSETRON HCL 4 MG/2ML IJ SOLN: 4.0000 mg | INTRAMUSCULAR | Status: DC | PRN

## 2012-04-29 MED ORDER — ASPIRIN EC 325 MG PO TBEC: 325.0000 mg | DELAYED_RELEASE_TABLET | Freq: Every day | ORAL | Status: DC

## 2012-04-29 MED ORDER — PANTOPRAZOLE SODIUM 40 MG PO TBEC
40.0000 mg | DELAYED_RELEASE_TABLET | Freq: Two times a day (BID) | ORAL | Status: DC
  Administered 2012-04-29 (×2): 40 mg via ORAL
  Filled 2012-04-29 (×2): qty 1

## 2012-04-29 MED ORDER — OMEPRAZOLE 40 MG PO CPDR: 40.0000 mg | DELAYED_RELEASE_CAPSULE | Freq: Every day | ORAL | Status: DC

## 2012-04-29 MED ORDER — FLEET ENEMA 7-19 GM/118ML RE ENEM: 1.0000 | ENEMA | Freq: Once | RECTAL | Status: DC | PRN

## 2012-04-29 MED ORDER — ACETAMINOPHEN 325 MG PO TABS: 650.0000 mg | ORAL_TABLET | ORAL | Status: DC | PRN

## 2012-04-29 MED ORDER — PANTOPRAZOLE SODIUM 40 MG PO TBEC: 40.0000 mg | DELAYED_RELEASE_TABLET | Freq: Every day | ORAL | Status: DC

## 2012-04-29 MED ORDER — TAMSULOSIN HCL 0.4 MG PO CAPS
0.4000 mg | ORAL_CAPSULE | Freq: Every day | ORAL | Status: DC
  Administered 2012-04-29: 0.4 mg via ORAL
  Filled 2012-04-29: qty 1

## 2012-04-29 MED ORDER — ONDANSETRON HCL 4 MG PO TABS: 4.0000 mg | ORAL_TABLET | Freq: Four times a day (QID) | ORAL | Status: DC | PRN

## 2012-04-29 MED ORDER — ENOXAPARIN SODIUM 40 MG/0.4ML ~~LOC~~ SOLN
40.0000 mg | SUBCUTANEOUS | Status: DC
  Administered 2012-04-29: 40 mg via SUBCUTANEOUS
  Filled 2012-04-29: qty 0.4

## 2012-04-29 MED ORDER — NITROGLYCERIN 0.4 MG SL SUBL: 0.4000 mg | SUBLINGUAL_TABLET | SUBLINGUAL | Status: DC | PRN

## 2012-04-29 MED ORDER — SODIUM CHLORIDE 0.9 % IJ SOLN
INTRAMUSCULAR | Status: AC
  Administered 2012-04-29: 10 mL
  Filled 2012-04-29: qty 3

## 2012-04-29 MED ORDER — METOPROLOL TARTRATE 50 MG PO TABS
50.0000 mg | ORAL_TABLET | Freq: Two times a day (BID) | ORAL | Status: DC
Start: 2012-04-29 — End: 2012-04-29
  Administered 2012-04-29 (×2): 50 mg via ORAL
  Filled 2012-04-29 (×2): qty 1

## 2012-04-29 MED ORDER — ACETAMINOPHEN 650 MG RE SUPP: 650.0000 mg | Freq: Four times a day (QID) | RECTAL | Status: DC | PRN

## 2012-04-29 MED ORDER — ALUM & MAG HYDROXIDE-SIMETH 200-200-20 MG/5ML PO SUSP: 30.0000 mL | ORAL | Status: DC | PRN

## 2012-04-29 MED ORDER — ISOSORBIDE MONONITRATE ER 60 MG PO TB24: 120.0000 mg | ORAL_TABLET | Freq: Every day | ORAL | Status: DC

## 2012-04-29 MED ORDER — ALBUTEROL SULFATE (5 MG/ML) 0.5% IN NEBU: 2.5000 mg | INHALATION_SOLUTION | Freq: Four times a day (QID) | RESPIRATORY_TRACT | Status: DC | PRN

## 2012-04-29 MED ORDER — OXYCODONE HCL 5 MG PO TABS: 5.0000 mg | ORAL_TABLET | ORAL | Status: DC | PRN

## 2012-04-29 MED ORDER — PANTOPRAZOLE SODIUM 40 MG IV SOLR
40.0000 mg | Freq: Once | INTRAVENOUS | Status: AC
  Administered 2012-04-29: 40 mg via INTRAVENOUS
  Filled 2012-04-29: qty 40

## 2012-04-29 MED ORDER — BISACODYL 5 MG PO TBEC: 5.0000 mg | DELAYED_RELEASE_TABLET | Freq: Every day | ORAL | Status: DC | PRN

## 2012-04-29 MED ORDER — METOPROLOL TARTRATE 50 MG PO TABS: 75.0000 mg | ORAL_TABLET | Freq: Two times a day (BID) | ORAL | Status: DC

## 2012-04-29 MED ORDER — ISOSORBIDE MONONITRATE ER 60 MG PO TB24
60.0000 mg | ORAL_TABLET | Freq: Every day | ORAL | Status: DC
  Administered 2012-04-29: 60 mg via ORAL
  Filled 2012-04-29: qty 1

## 2012-04-29 MED ORDER — SIMVASTATIN 20 MG PO TABS: 40.0000 mg | ORAL_TABLET | Freq: Every day | ORAL | Status: DC

## 2012-04-29 MED ORDER — AMLODIPINE BESYLATE 5 MG PO TABS
10.0000 mg | ORAL_TABLET | Freq: Every day | ORAL | Status: DC
  Administered 2012-04-29: 10 mg via ORAL
  Filled 2012-04-29: qty 2

## 2012-04-29 MED ORDER — METOPROLOL TARTRATE 25 MG PO TABS: 75.0000 mg | ORAL_TABLET | Freq: Two times a day (BID) | ORAL | Status: DC

## 2012-04-29 MED ORDER — BUDESONIDE-FORMOTEROL FUMARATE 160-4.5 MCG/ACT IN AERO
2.0000 | INHALATION_SPRAY | Freq: Two times a day (BID) | RESPIRATORY_TRACT | Status: DC
Start: 2012-04-29 — End: 2012-04-29
  Filled 2012-04-29: qty 6

## 2012-04-29 MED ORDER — ASPIRIN 325 MG PO TABS
325.0000 mg | ORAL_TABLET | Freq: Every day | ORAL | Status: DC
  Administered 2012-04-29: 325 mg via ORAL
  Filled 2012-04-29: qty 1

## 2012-04-29 MED ORDER — MORPHINE SULFATE 2 MG/ML IJ SOLN: 2.0000 mg | INTRAMUSCULAR | Status: DC | PRN

## 2012-04-29 NOTE — Progress Notes (Signed)
Discharge Summary: a/o.vss. Saline lock removed. Up ad lib. No complaints. Discharge instructions given. Prescriptions given. Pt verbalized understanding of instructions. Left floor via wheelchair with nursing staff and family member.

## 2012-04-29 NOTE — H&P (Signed)
PCP:   Ignatius Specking., MD   Chief Complaint:  Chest pain tonight  HPI: Elijah Santana is an 78 y.o. male.  Obese Caucasian gentleman with known coronary artery disease, status post CABG. Has episodic substernal chest pains, occurring approximately once per week, nonradiating and relieved by one or 2 sublingual nitroglycerin. Had a recurrence of a similar pain, estimated at 5/10, was not immediately relieved by nitroglycerin, and got worse when he went to lie down on his back. He had his wife drive him to the emergency room but by the time of arrival in the emergency room pain had resolved. He notes that lying flat on his seem to bring on the pain and he feels better when he is sitting up. He does have episodic dyspepsia symptoms. He is not physically very active he does not climb stairs but is inclined to feel that the pain is aggravated by exertion.  There was no associated radiation nausea dizziness or diaphoresis.  He is concerned about persistent coughing which has been going on for about 6 months since he had an attack of pneumonia; is been prescribed bronchodilators but these don't seem to help; he is not on treatment for GERD although he does episodically have GERD symptoms.  Rewiew of Systems:  The patient denies anorexia, fever, weight loss,, vision loss,  hoarseness,  syncope, dyspnea on exertion, peripheral edema, balance deficits, hemoptysis, abdominal pain, melena, hematochezia, severe indigestion/heartburn, hematuria, incontinence, genital sores, muscle weakness, suspicious skin lesions, transient blindness, difficulty walking, depression, unusual weight change, abnormal bleeding, enlarged lymph nodes, angioedema, and breast masses.    Past Medical History  Diagnosis Date  . Coronary atherosclerosis of native coronary artery     Multivessel, LVEF 55%  . Chronic back pain   . Mixed hyperlipidemia   . Essential hypertension, benign   . Myocardial infarction   . Prostate cancer    Radiation therapy 1996    Past Surgical History  Procedure Date  . Coronary artery bypass graft 2004    LIMA to LAD, SVG to diagonal, SVG to circumflex, SVG to PDA  . Coronary stent placement     Medications:  HOME MEDS: Prior to Admission medications   Medication Sig Start Date End Date Taking? Authorizing Provider  amLODipine (NORVASC) 10 MG tablet Take 1 tablet (10 mg total) by mouth daily. 01/21/12  Yes Jonelle Sidle, MD  aspirin 325 MG tablet Take 325 mg by mouth daily.     Yes Historical Provider, MD  diclofenac (CATAFLAM) 50 MG tablet Take 50 mg by mouth 2 (two) times daily.   Yes Historical Provider, MD  isosorbide mononitrate (IMDUR) 60 MG 24 hr tablet Take 1 tablet (60 mg total) by mouth daily. 01/21/12  Yes Jonelle Sidle, MD  megestrol (MEGACE) 40 MG tablet Take 20 mg by mouth daily.  04/01/12  Yes Historical Provider, MD  metoprolol (LOPRESSOR) 50 MG tablet Take 1 tablet (50 mg total) by mouth 2 (two) times daily. 01/21/12  Yes Jonelle Sidle, MD  NITROSTAT 0.4 MG SL tablet PLACE ONE (1) TABLET UNDER TONGUE EVERY 5 MINUTES UP TO (3) DOSES AS NEEDED FOR CHEST PAIN. 10/06/11  Yes Jonelle Sidle, MD  simvastatin (ZOCOR) 40 MG tablet Take 1 tablet (40 mg total) by mouth at bedtime. 01/21/12  Yes Jonelle Sidle, MD  SYMBICORT 160-4.5 MCG/ACT inhaler Inhale 2 puffs into the lungs as needed.  03/01/12  Yes Historical Provider, MD  Tamsulosin HCl (FLOMAX) 0.4 MG CAPS Take  0.4 mg by mouth daily.     Yes Historical Provider, MD  VENTOLIN HFA 108 (90 BASE) MCG/ACT inhaler Inhale 1 puff into the lungs as needed.  03/01/12  Yes Historical Provider, MD     Allergies:  No Known Allergies  Social History:   reports that he has never smoked. He quit smokeless tobacco use about 20 months ago. His smokeless tobacco use included Chew. He reports that he does not drink alcohol or use illicit drugs.  Family History: Family History  Problem Relation Age of Onset  . Hypertension    .  Coronary artery disease       Physical Exam: Filed Vitals:   04/28/12 2312 04/29/12 0001 04/29/12 0100 04/29/12 0217  BP: 137/85 133/64 124/77   Pulse: 87 86 87   Temp: 98 F (36.7 C)     TempSrc: Oral     Resp: 20 18 17    Height: 5\' 8"  (1.727 m)   5\' 8"  (1.727 m)  Weight: 98.431 kg (217 lb)   101.1 kg (222 lb 14.2 oz)  SpO2: 96% 97% 98%    Blood pressure 124/77, pulse 87, temperature 98 F (36.7 C), temperature source Oral, resp. rate 17, height 5\' 8"  (1.727 m), weight 101.1 kg (222 lb 14.2 oz), SpO2 98.00%.  GEN:  Pleasant obese Caucasian gentleman lying in the stretcher in no acute distress; cooperative with exam PSYCH:  alert and oriented x4; does not appear anxious or depressed; affect is appropriate. HEENT: Mucous membranes pink and anicteric; PERRLA; EOM intact; no cervical lymphadenopathy nor thyromegaly or carotid bruit; no JVD; Breasts:: Not examined CHEST WALL: No tenderness CHEST: Normal respiration, clear to auscultation bilaterally HEART: Regular rate and rhythm; no murmurs rubs or gallops BACK:  no CVA tenderness ABDOMEN: Obese, soft non-tender; no masses, no organomegaly, normal abdominal bowel sounds;  Rectal Exam: Not done EXTREMITIES: No bone or joint deformity; age-appropriate arthropathy of the hands and knees; no edema; no ulcerations. Genitalia: not examined PULSES: 2+ and symmetric SKIN: Normal hydration no rash or ulceration CNS: Cranial nerves 2-12 grossly intact no focal lateralizing neurologic deficit   Labs & Imaging Results for orders placed during the hospital encounter of 04/28/12 (from the past 48 hour(s))  POCT I-STAT TROPONIN I     Status: Normal   Collection Time   04/28/12 11:33 PM      Component Value Range Comment   Troponin i, poc 0.01  0.00 - 0.08 ng/mL    Comment 3            CBC     Status: Abnormal   Collection Time   04/28/12 11:42 PM      Component Value Range Comment   WBC 8.2  4.0 - 10.5 K/uL    RBC 4.06 (*) 4.22 - 5.81  MIL/uL    Hemoglobin 12.2 (*) 13.0 - 17.0 g/dL    HCT 16.1 (*) 09.6 - 52.0 %    MCV 88.4  78.0 - 100.0 fL    MCH 30.0  26.0 - 34.0 pg    MCHC 34.0  30.0 - 36.0 g/dL    RDW 04.5  40.9 - 81.1 %    Platelets 208  150 - 400 K/uL   COMPREHENSIVE METABOLIC PANEL     Status: Abnormal   Collection Time   04/28/12 11:42 PM      Component Value Range Comment   Sodium 140  135 - 145 mEq/L    Potassium 4.2  3.5 - 5.1  mEq/L    Chloride 107  96 - 112 mEq/L    CO2 22  19 - 32 mEq/L    Glucose, Bld 146 (*) 70 - 99 mg/dL    BUN 21  6 - 23 mg/dL    Creatinine, Ser 1.61  0.50 - 1.35 mg/dL    Calcium 9.4  8.4 - 09.6 mg/dL    Total Protein 6.9  6.0 - 8.3 g/dL    Albumin 3.8  3.5 - 5.2 g/dL    AST 13  0 - 37 U/L    ALT 16  0 - 53 U/L    Alkaline Phosphatase 47  39 - 117 U/L    Total Bilirubin 0.3  0.3 - 1.2 mg/dL    GFR calc non Af Amer 68 (*) >90 mL/min    GFR calc Af Amer 79 (*) >90 mL/min   POCT I-STAT, CHEM 8     Status: Abnormal   Collection Time   04/28/12 11:47 PM      Component Value Range Comment   Sodium 142  135 - 145 mEq/L    Potassium 3.9  3.5 - 5.1 mEq/L    Chloride 109  96 - 112 mEq/L    BUN 21  6 - 23 mg/dL    Creatinine, Ser 0.45  0.50 - 1.35 mg/dL    Glucose, Bld 409 (*) 70 - 99 mg/dL    Calcium, Ion 8.11  9.14 - 1.32 mmol/L    TCO2 19  0 - 100 mmol/L    Hemoglobin 11.6 (*) 13.0 - 17.0 g/dL    HCT 78.2 (*) 95.6 - 52.0 %    Dg Chest Portable 1 View  04/29/2012  *RADIOLOGY REPORT*  Clinical Data: Chest pain, cough.  PORTABLE CHEST - 1 VIEW  Comparison: 03/01/2012  Findings: Unchanged cardiomediastinal contours with tortuosity to the aorta.  Heart size upper normal to mildly enlarged.  Status post median sternotomy and CABG.  There is mild interstitial prominence which may be chronic.  Elevated right hemidiaphragm with minimal right lung base opacity.  No pleural effusion.  No pneumothorax.  No acute osseous finding.  Multilevel degenerative changes.  IMPRESSION: Minimal right lung  base opacity with associated hemidiaphragm elevation may reflect atelectasis or scarring. Otherwise, no radiographic evidence of acute cardiopulmonary process.  Original Report Authenticated By: Waneta Martins, M.D.      Assessment Present on Admission:  .Chest pain .CORONARY ATHEROSCLEROSIS NATIVE CORONARY ARTERY .GERD (gastroesophageal reflux disease) .Essential hypertension, benign .DYSLIPIDEMIA .Chronic back pain .Obesity .Persistent cough   PLAN: We'll bring this gentleman in on observation on a chest pain rule out protocol; according serial cardiac enzymes and when necessary nitrates; Continue his home dose beta blockers, and aspirin. 6 chest pain is compatible with GERD, and is associated with a persistent cough, will give him a trial of high-dose proton pump inhibitors.   given the benefit of a cardiology evaluation as an in or outpatient Other plans as per orders.   Charitie Hinote 04/29/2012, 2:44 AM

## 2012-04-29 NOTE — Discharge Summary (Signed)
Physician Discharge Summary  Patient ID: Elijah Santana MRN: [id number] DOB/AGE: [birth year] 78 y.o.  Admit date: 04/28/2012 Discharge date: 04/29/2012  Discharge Diagnoses:  Active Problems:  DYSLIPIDEMIA  Essential hypertension, benign  CORONARY ATHEROSCLEROSIS NATIVE CORONARY ARTERY  Chest pain  GERD (gastroesophageal reflux disease)  Chronic back pain  Obesity  Persistent cough   Medication List  As of 04/29/2012  6:12 PM   STOP taking these medications         diclofenac 50 MG tablet         TAKE these medications         albuterol 108 (90 BASE) MCG/ACT inhaler   Commonly known as: PROVENTIL HFA;VENTOLIN HFA   Inhale 1 puff into the lungs as needed. Shortness of breath      amLODipine 10 MG tablet   Commonly known as: NORVASC   Take 1 tablet (10 mg total) by mouth daily.      aspirin 325 MG tablet   Take 325 mg by mouth daily.      budesonide-formoterol 160-4.5 MCG/ACT inhaler   Commonly known as: SYMBICORT   Inhale 2 puffs into the lungs as needed. Shortness of breath      isosorbide mononitrate 60 MG 24 hr tablet   Commonly known as: IMDUR   Take 1 tablet (60 mg total) by mouth daily.      isosorbide mononitrate 120 MG 24 hr tablet   Commonly known as: IMDUR   Take 1 tablet (120 mg total) by mouth daily.      megestrol 40 MG tablet   Commonly known as: MEGACE   Take 20 mg by mouth daily.      metoprolol 50 MG tablet   Commonly known as: LOPRESSOR   Take 1.5 tablets (75 mg total) by mouth 2 (two) times daily.      NITROSTAT 0.4 MG SL tablet   Generic drug: nitroGLYCERIN   PLACE ONE (1) TABLET UNDER TONGUE EVERY 5 MINUTES UP TO (3) DOSES AS NEEDED FOR CHEST PAIN.      omeprazole 40 MG capsule   Commonly known as: PRILOSEC   Take 1 capsule (40 mg total) by mouth daily.      simvastatin 40 MG tablet   Commonly known as: ZOCOR   Take 1 tablet (40 mg total) by mouth at bedtime.      Tamsulosin HCl 0.4 MG Caps   Commonly known as: FLOMAX   Take 0.4  mg by mouth daily.            Discharge Orders    Future Orders Please Complete By Expires   Diet - low sodium heart healthy         Follow-up Information    Follow up with Nona Dell, MD. (his office will call you for appointment time)    Contact information:   [address] [place]. [place] [address] [phone]         Disposition: home  Discharged Condition: stable  Consults: Treatment Team:  Kathlen Brunswick, MD  Labs:   Results for orders placed during the hospital encounter of 04/28/12 (from the past 48 hour(s))  POCT I-STAT TROPONIN I     Status: Normal   Collection Time   04/28/12 11:33 PM      Component Value Range Comment   Troponin i, poc 0.01  0.00 - 0.08 ng/mL    Comment 3            CBC  Status: Abnormal   Collection Time   04/28/12 11:42 PM      Component Value Range Comment   WBC 8.2  4.0 - 10.5 K/uL    RBC 4.06 (*) 4.22 - 5.81 MIL/uL    Hemoglobin 12.2 (*) 13.0 - 17.0 g/dL    HCT 16.1 (*) 09.6 - 52.0 %    MCV 88.4  78.0 - 100.0 fL    MCH 30.0  26.0 - 34.0 pg    MCHC 34.0  30.0 - 36.0 g/dL    RDW 04.5  40.9 - 81.1 %    Platelets 208  150 - 400 K/uL   COMPREHENSIVE METABOLIC PANEL     Status: Abnormal   Collection Time   04/28/12 11:42 PM      Component Value Range Comment   Sodium 140  135 - 145 mEq/L    Potassium 4.2  3.5 - 5.1 mEq/L    Chloride 107  96 - 112 mEq/L    CO2 22  19 - 32 mEq/L    Glucose, Bld 146 (*) 70 - 99 mg/dL    BUN 21  6 - 23 mg/dL    Creatinine, Ser 9.14  0.50 - 1.35 mg/dL    Calcium 9.4  8.4 - 78.2 mg/dL    Total Protein 6.9  6.0 - 8.3 g/dL    Albumin 3.8  3.5 - 5.2 g/dL    AST 13  0 - 37 U/L    ALT 16  0 - 53 U/L    Alkaline Phosphatase 47  39 - 117 U/L    Total Bilirubin 0.3  0.3 - 1.2 mg/dL    GFR calc non Af Amer 68 (*) >90 mL/min    GFR calc Af Amer 79 (*) >90 mL/min   POCT I-STAT, CHEM 8     Status: Abnormal   Collection Time   04/28/12 11:47 PM      Component Value Range Comment    Sodium 142  135 - 145 mEq/L    Potassium 3.9  3.5 - 5.1 mEq/L    Chloride 109  96 - 112 mEq/L    BUN 21  6 - 23 mg/dL    Creatinine, Ser 9.56  0.50 - 1.35 mg/dL    Glucose, Bld 213 (*) 70 - 99 mg/dL    Calcium, Ion 0.86  5.78 - 1.32 mmol/L    TCO2 19  0 - 100 mmol/L    Hemoglobin 11.6 (*) 13.0 - 17.0 g/dL    HCT 46.9 (*) 62.9 - 52.0 %   HEPATIC FUNCTION PANEL     Status: Normal   Collection Time   04/29/12  3:02 AM      Component Value Range Comment   Total Protein 6.3  6.0 - 8.3 g/dL    Albumin 3.5  3.5 - 5.2 g/dL    AST 11  0 - 37 U/L    ALT 15  0 - 53 U/L    Alkaline Phosphatase 43  39 - 117 U/L    Total Bilirubin 0.3  0.3 - 1.2 mg/dL    Bilirubin, Direct <5.2  0.0 - 0.3 mg/dL    Indirect Bilirubin NOT CALCULATED  0.3 - 0.9 mg/dL   APTT     Status: Normal   Collection Time   04/29/12  3:02 AM      Component Value Range Comment   aPTT 34  24 - 37 seconds   PROTIME-INR     Status: Normal  Collection Time   04/29/12  3:02 AM      Component Value Range Comment   Prothrombin Time 14.7  11.6 - 15.2 seconds    INR 1.13  0.00 - 1.49   TSH     Status: Normal   Collection Time   04/29/12  3:02 AM      Component Value Range Comment   TSH 2.485  0.350 - 4.500 uIU/mL   CARDIAC PANEL(CRET KIN+CKTOT+MB+TROPI)     Status: Normal   Collection Time   04/29/12  3:02 AM      Component Value Range Comment   Total CK 113  7 - 232 U/L    CK, MB 2.1  0.3 - 4.0 ng/mL    Troponin I <0.30  <0.30 ng/mL    Relative Index 1.9  0.0 - 2.5   MAGNESIUM     Status: Normal   Collection Time   04/29/12  3:05 AM      Component Value Range Comment   Magnesium 2.2  1.5 - 2.5 mg/dL   BASIC METABOLIC PANEL     Status: Abnormal   Collection Time   04/29/12  3:05 AM      Component Value Range Comment   Sodium 141  135 - 145 mEq/L    Potassium 4.0  3.5 - 5.1 mEq/L    Chloride 109  96 - 112 mEq/L    CO2 21  19 - 32 mEq/L    Glucose, Bld 106 (*) 70 - 99 mg/dL    BUN 18  6 - 23 mg/dL    Creatinine, Ser  4.54  0.50 - 1.35 mg/dL    Calcium 9.3  8.4 - 09.8 mg/dL    GFR calc non Af Amer 80 (*) >90 mL/min    GFR calc Af Amer >90  >90 mL/min   CBC     Status: Abnormal   Collection Time   04/29/12  3:05 AM      Component Value Range Comment   WBC 7.9  4.0 - 10.5 K/uL    RBC 3.81 (*) 4.22 - 5.81 MIL/uL    Hemoglobin 11.6 (*) 13.0 - 17.0 g/dL    HCT 11.9 (*) 14.7 - 52.0 %    MCV 88.7  78.0 - 100.0 fL    MCH 30.4  26.0 - 34.0 pg    MCHC 34.3  30.0 - 36.0 g/dL    RDW 82.9  56.2 - 13.0 %    Platelets 194  150 - 400 K/uL   CARDIAC PANEL(CRET KIN+CKTOT+MB+TROPI)     Status: Normal   Collection Time   04/29/12 11:23 AM      Component Value Range Comment   Total CK 114  7 - 232 U/L    CK, MB 2.0  0.3 - 4.0 ng/mL    Troponin I <0.30  <0.30 ng/mL    Relative Index 1.8  0.0 - 2.5     Diagnostics:  Dg Chest Portable 1 View  04/29/2012  *RADIOLOGY REPORT*  Clinical Data: Chest pain, cough.  PORTABLE CHEST - 1 VIEW  Comparison: 03/01/2012  Findings: Unchanged cardiomediastinal contours with tortuosity to the aorta.  Heart size upper normal to mildly enlarged.  Status post median sternotomy and CABG.  There is mild interstitial prominence which may be chronic.  Elevated right hemidiaphragm with minimal right lung base opacity.  No pleural effusion.  No pneumothorax.  No acute osseous finding.  Multilevel degenerative changes.  IMPRESSION: Minimal right lung base opacity  with associated hemidiaphragm elevation may reflect atelectasis or scarring. Otherwise, no radiographic evidence of acute cardiopulmonary process.  Original Report Authenticated By: Waneta Martins, M.D.   Procedures: none  EKG: Normal sinus rhythm with nonspecific changes  Full Code   Hospital Course: See H&P for complete admission details. Mr. Saadeh is a 78 year old white male with a history of heart disease and coronary artery bypass graft who presented with chest pain previous to similar episodes of acute coronary syndrome. He came  to the emergency room because his discomfort was not immediately relieved by nitroglycerin at home. It was worse with lying supine. He also has had a cough. He has had some reflux symptoms recently. He was admitted overnight and monitored on telemetry. He ruled out for MI. Cardiology will consult was obtained and they recommended outpatient followup and consideration for possible outpatient ischemic workup. His NSAID will be stopped and he will be started on a proton pump inhibitor in case this is gastrointestinal in nature. He has had no recurrence of his pain and is requesting discharge. His other medical problems have remained stable.  Discharge Exam:  Blood pressure 129/85, pulse 75, temperature 98.5 F (36.9 C), temperature source Oral, resp. rate 18, height 5\' 8"  (1.727 m), weight 101.1 kg (222 lb 14.2 oz), SpO2 97.00%.  General: Comfortable Lungs clear to auscultation bilaterally without wheeze rhonchi or rales Cardiovascular regular rate rhythm without murmurs gallops rubs Abdomen soft nontender nondistended Extremities no clubbing cyanosis or edema  Signed: Rajat Staver L 04/29/2012, 6:12 PM

## 2012-04-29 NOTE — Consult Note (Addendum)
CARDIOLOGY CONSULT NOTE  Patient ID: Elijah Santana MRN: [id number] DOB/AGE: [birth year] 78 y.o.  Admit date: 04/28/2012 Referring Physician: PTH Primary PhysicianVYAS,DHRUV B., MD- Primary Cardiologist: [facility] Reason for Consultation: Chest Pain with known CAD Active Problems:  DYSLIPIDEMIA  Essential hypertension, benign  CORONARY ATHEROSCLEROSIS NATIVE CORONARY ARTERY  Chest pain  GERD (gastroesophageal reflux disease)  Chronic back pain  Obesity  Persistent cough  HPI: Elijah Santana is a 78 y/o patient of Dr. Diona Browner with known history of CAD., s/p CABG (X4 vessle: LIMA to LAD, SVG to PDA, SVG -PL  SVG to diag.) ,hypercholesterolemia, chronic back pain and COPD.   He states he was in his usual state of health when he had just finished eating a frosty from [facility].  He states that he got real cold oand afterwards began have a heartburn sensation in the middle of his chest. The pain did not radiate nor did it cause diaphoresis or dizziness nausea vomiting or shortness of breath. He states that he has had these on and off for the last year although they are not related to being or exertion. On arrival to the emergency room the patient's blood pressure is 137/85 with a heart rate of 87 he was afebrile. He was not found to be anemic, potassium 4.2 blood glucose was elevated 146. EKG revealed no acute ST-T wave changes. Was essentially the same as EKG completed in January of 2013. He said no recurrence of chest discomfort or heartburn since admission. Cardiac enzymes are found be negative x 2. He is anxious to return home but wanted cardiology to see him first.  Cardiac catheterization 2010-grafts patent with the exception of the vein graft to the diagonal. With there are no areas for intervention at that time and medical therapy was recommended.  Review of systems complete and found to be negative unless listed above   Past Medical History  Diagnosis Date  . Coronary  atherosclerosis of native coronary artery     Multivessel, LVEF 55%  . Chronic back pain   . Mixed hyperlipidemia   . Essential hypertension, benign   . Myocardial infarction   . Prostate cancer     Radiation therapy 1996    Family History  Problem Relation Age of Onset  . Hypertension    . Coronary artery disease      History   Social History  . Marital Status: Married    Spouse Name: N/A    Number of Children: N/A  . Years of Education: N/A   Occupational History  . Retired     Marketing executive   Social History Main Topics  . Smoking status: Never Smoker   . Smokeless tobacco: Former Neurosurgeon    Types: Chew    Quit date: 08/07/2010   Comment: never chewed up over a pack/day  . Alcohol Use: No  . Drug Use: No  . Sexually Active: Not on file   Other Topics Concern  . Not on file   Social History Narrative  . No narrative on file    Past Surgical History  Procedure Date  . Coronary artery bypass graft 2004    LIMA to LAD, SVG to diagonal, SVG to circumflex, SVG to PDA  . Coronary stent placement      Prescriptions prior to admission  Medication Sig Dispense Refill  . albuterol (PROVENTIL HFA;VENTOLIN HFA) 108 (90 BASE) MCG/ACT inhaler Inhale 1 puff into the lungs as needed. Shortness of breath      .  amLODipine (NORVASC) 10 MG tablet Take 1 tablet (10 mg total) by mouth daily.  90 tablet  3  . aspirin 325 MG tablet Take 325 mg by mouth daily.        . budesonide-formoterol (SYMBICORT) 160-4.5 MCG/ACT inhaler Inhale 2 puffs into the lungs as needed. Shortness of breath      . diclofenac (CATAFLAM) 50 MG tablet Take 50 mg by mouth 2 (two) times daily.      . isosorbide mononitrate (IMDUR) 60 MG 24 hr tablet Take 1 tablet (60 mg total) by mouth daily.  90 tablet  3  . megestrol (MEGACE) 40 MG tablet Take 20 mg by mouth daily.       . metoprolol (LOPRESSOR) 50 MG tablet Take 1 tablet (50 mg total) by mouth 2 (two) times daily.  180 tablet  3  . NITROSTAT 0.4  MG SL tablet PLACE ONE (1) TABLET UNDER TONGUE EVERY 5 MINUTES UP TO (3) DOSES AS NEEDED FOR CHEST PAIN.  25 each  2  . simvastatin (ZOCOR) 40 MG tablet Take 1 tablet (40 mg total) by mouth at bedtime.  90 tablet  3  . Tamsulosin HCl (FLOMAX) 0.4 MG CAPS Take 0.4 mg by mouth daily.          Cardiac Cath: 08/2009 CONCLUSION: 1. Coronary artery disease status post prior coronary bypass graft surgery Severe native vessel disease with total occlusion of the left anterior descending and right coronary arteries and total occlusion of the marginal he was found that his grafts were patent with the exception of the vein graft to the diagonal branch of the LAD. Patent left internal mammary artery graft to left anterior descending with total occlusion of the left anterior descending after the insertion site, patent vein graft to the posterior descending branch of the right coronary artery with 30-40% narrowing proximally, patent vein graft to the posterolateral  branch of the circumflex artery with filling of the obtuse marginal branch by collaterals, and occluded vein graft to diagonal branch of the left anterior descending. Anterolateral wall hypokinesis with an estimated ejection fraction of 55%.  All the grafts are patent with the exception of the vein graft to diagonal branch of the LAD.  This may be the area of abnormality on the Myoview scan.  He does have sources of ischemia in the distal LAD and in the circumflex, but these are not suitable for revascularization and medical therapy is recommended.Juanda Chance)  Physical Exam: Blood pressure 118/71, pulse 77, temperature 98.1 F (36.7 C), temperature source Oral, resp. rate 18, height 5\' 8"  (1.727 m), weight 222 lb 14.2 oz (101.1 kg), SpO2 98.00%. General: Well developed, well nourished, in no acute distress; impaired hearing acuity Head: Eyes PERRLA, No xanthomas.   Normal cephalic and atramatic  Lungs: Some upper airway crackles with frequent coughing  noted. Heart: HRRR S1 S2,distant heart sounds. pulses are 2+ & equal.            No carotid bruit. No JVD.  No abdominal bruits. No femoral bruits. Abdomen: Bowel sounds are present, abdomen soft & non-tender, no masses Msk:  Back normal, normal gait. Normal strength and tone for age. Extremities: No clubbing, cyanosis or edema.  DP +1 Neuro: Alert and oriented X 3. Psych:  Good affect, responds appropriately   Lab Results  Component Value Date   WBC 7.9 04/29/2012   HGB 11.6* 04/29/2012   HCT 33.8* 04/29/2012   MCV 88.7 04/29/2012   PLT 194 04/29/2012  Lab 04/29/12 0305 04/29/12 0302  NA 141 --  K 4.0 --  CL 109 --  CO2 21 --  BUN 18 --  CREATININE 0.91 --  CALCIUM 9.3 --  PROT -- 6.3  BILITOT -- 0.3  ALKPHOS -- 43  ALT -- 15  AST -- 11  GLUCOSE 106* --   Lab Results  Component Value Date   CKTOTAL 114 04/29/2012   CKMB 2.0 04/29/2012   TROPONINI <0.30 04/29/2012    Radiology: Dg Chest Portable 1 View  04/29/2012  *RADIOLOGY REPORT*  Clinical Data: Chest pain, cough.  PORTABLE CHEST -   IMPRESSION: Minimal right lung base opacity with associated hemidiaphragm elevation may reflect atelectasis or scarring. Otherwise, no radiographic evidence of acute cardiopulmonary process.  Original Report Authenticated By: Waneta Martins, M.D.   EKG:NSR with with nonspecific ST changes in lateral leads. Unchanged from EKG in January of 2013.  ASSESSMENT AND PLAN:  1. Chest pain: Typical and atypical features. Cardiac enzymes are negative x2 with no changes in his EKG. He states he has been having this heartburn on and off over the last year, but no further workup was completed. He has been medically compliant.  He has not had any recurrent chest pain since admission. The patient is anxious to go home and will followup in our office if released by Dr. Dietrich Pates. Recommend continuing PPI, and reduction of fatty foods.  2. CAD: Coronary artery bypass grafting, four-vessel secondary to  severe native vessel disease in 2004. He appears stable from a cardiac standpoint. He can follow up with Dr. Diona Browner in our [place] office.  Bettey Mare. Lyman Bishop NP [facility] 04/29/2012, 12:46 PM   Cardiology Attending Patient interviewed and examined. Discussed with Joni Reining, NP.  Above note annotated and modified based upon my findings.  Patient has known myocardial ischemia based upon previous stress test and cardiac catheterization. Current symptoms are similar to those that have been thought to be of cardiac origin. He also has exertional symptoms are even more suggestive of angina. The fact that all but one of his grafts are patent, that the area of ischemia was small on stress test and that he has remained stable in recent months all suggest a good prognosis. Medical therapy will be intensified and treatment for possible GERD continued. We recommend that discharge on currently ordered cardiac medication plus a PPI and will arrange followup in our [place] office.  Natchez Bing, MD 04/29/2012, 5:41 PM

## 2012-05-11 ENCOUNTER — Encounter: Payer: Self-pay | Admitting: Cardiology

## 2012-05-11 ENCOUNTER — Ambulatory Visit (INDEPENDENT_AMBULATORY_CARE_PROVIDER_SITE_OTHER): Payer: Medicare Other | Admitting: Cardiology

## 2012-05-11 VITALS — BP 133/76 | HR 88 | Resp 16 | Ht 68.0 in | Wt 224.0 lb

## 2012-05-11 DIAGNOSIS — R079 Chest pain, unspecified: Secondary | ICD-10-CM

## 2012-05-11 DIAGNOSIS — I10 Essential (primary) hypertension: Secondary | ICD-10-CM

## 2012-05-11 DIAGNOSIS — I2510 Atherosclerotic heart disease of native coronary artery without angina pectoris: Secondary | ICD-10-CM

## 2012-05-11 DIAGNOSIS — E785 Hyperlipidemia, unspecified: Secondary | ICD-10-CM

## 2012-05-11 NOTE — Progress Notes (Signed)
Clinical Summary Elijah Santana is a 78 y.o.male presenting for followup. I saw him recently in May for a routine visit. Record review finds he was seen recently in the ED at [facility], observed overnight secondary to chest pain. He was also reporting cough and reflux symptoms. He ruled out for myocardial infarction and was seen by our cardiology team at [facility]. NSAID was stopped, PPI was added, Imdur was increased, and followup with me was arranged.  Prior Cardiolite from 2010 showed evidence of lateral ischemia with LVEF of 58%. Cardiac catheterization at that point revealed patent bypass grafts with the exception of the vein graft to the diagonal branch of the LAD. He was noted to have some sources of ischemia in the distal LAD and circumflex distribution, however they were not amenable to revascularization. We have been managing him medically over time. We reviewed this information today.  He reports a fairly atypical chest pain syndrome with cough, however does have other symptoms more consistent with angina. He has only been on PPI for little over a week as well as his increased dose in door. Today I spoke with him about followup ischemic evaluation. After discussion, we have elected to give things more time to see if the interventions made with medical therapy lead to improvement. If not, likely a cardiac catheterization will be arranged to assess for revascularization options.  No Known Allergies  Current Outpatient Prescriptions  Medication Sig Dispense Refill  . albuterol (PROVENTIL HFA;VENTOLIN HFA) 108 (90 BASE) MCG/ACT inhaler Inhale 1 puff into the lungs as needed. Shortness of breath      . amLODipine (NORVASC) 10 MG tablet Take 1 tablet (10 mg total) by mouth daily.  90 tablet  3  . aspirin 325 MG tablet Take 325 mg by mouth daily.        . budesonide-formoterol (SYMBICORT) 160-4.5 MCG/ACT inhaler Inhale 2 puffs into the lungs as needed. Shortness of breath      . isosorbide  mononitrate (IMDUR) 120 MG 24 hr tablet Take 1 tablet (120 mg total) by mouth daily.  30 tablet  0  . isosorbide mononitrate (IMDUR) 60 MG 24 hr tablet Take 120 mg by mouth daily.      . megestrol (MEGACE) 40 MG tablet Take 20 mg by mouth daily.       . metoprolol (LOPRESSOR) 50 MG tablet Take 1.5 tablets (75 mg total) by mouth 2 (two) times daily.  180 tablet  3  . NITROSTAT 0.4 MG SL tablet PLACE ONE (1) TABLET UNDER TONGUE EVERY 5 MINUTES UP TO (3) DOSES AS NEEDED FOR CHEST PAIN.  25 each  2  . omeprazole (PRILOSEC) 40 MG capsule Take 1 capsule (40 mg total) by mouth daily.  30 capsule  0  . simvastatin (ZOCOR) 40 MG tablet Take 1 tablet (40 mg total) by mouth at bedtime.  90 tablet  3  . Tamsulosin HCl (FLOMAX) 0.4 MG CAPS Take 0.4 mg by mouth daily.        Marland Kitchen DISCONTD: isosorbide mononitrate (IMDUR) 60 MG 24 hr tablet Take 1 tablet (60 mg total) by mouth daily.  90 tablet  3    Past Medical History  Diagnosis Date  . Coronary atherosclerosis of native coronary artery     Multivessel, LVEF 55%  . Chronic back pain   . Mixed hyperlipidemia   . Essential hypertension, benign   . Myocardial infarction   . Prostate cancer     Radiation therapy 1996  Social History Mr. Bonano reports that he has never smoked. He quit smokeless tobacco use about 21 months ago. His smokeless tobacco use included Chew. Mr. Prashad reports that he does not drink alcohol.  Review of Systems No palpitations. Intermittent dry cough. Not using inhalers regularly. Chronic shortness of breath. No syncope. Otherwise negative.  Physical Examination Filed Vitals:   05/11/12 1508  BP: 133/76  Pulse: 88  Resp: 16   Overweight male in no acute distress.  HEENT: Conjunctiva and lids normal, oropharynx with moist mucosa.  Neck: Supple, no elevated jugular venous pressure or bruits.  Lungs: Clear, diminished breath sounds, nonlabored.  Cardiac: Regular rate and rhythm, no S3.  Abdomen: Soft, nontender, bowel sounds  present.  Extremities: No pitting edema, distal pulses 1-2+.    Problem List and Plan   Chest pain Symptoms are atypical and associated with cough, possibly related to a component of bronchospasm or reflux. others however are more consistent with angina. He has multivessel disease with graft disease documented 2010 at outlined above. If the medication adjustments do not lead to improvement in symptoms, We will likely pursue a diagnostic cardiac catheterization to assess for potential revascularization options.  CORONARY ATHEROSCLEROSIS NATIVE CORONARY ARTERY Multivessel status post CABG with graft disease.  Essential hypertension, benign Blood pressure is reasonably well controlled.  DYSLIPIDEMIA He has had good control of lipids over time.    Jonelle Sidle, M.D., F.A.C.C.

## 2012-05-11 NOTE — Assessment & Plan Note (Signed)
Symptoms are atypical and associated with cough, possibly related to a component of bronchospasm or reflux. others however are more consistent with angina. He has multivessel disease with graft disease documented 2010 at outlined above. If the medication adjustments do not lead to improvement in symptoms, We will likely pursue a diagnostic cardiac catheterization to assess for potential revascularization options.

## 2012-05-11 NOTE — Assessment & Plan Note (Signed)
He has had good control of lipids over time.

## 2012-05-11 NOTE — Assessment & Plan Note (Signed)
Blood pressure is reasonably well controlled. ?

## 2012-05-11 NOTE — Patient Instructions (Addendum)
Your physician recommends that you schedule a follow-up appointment in: 6 weeks with Dr [facility]    Your physician recommends that you continue on your current medications as directed. Please refer to the Current Medication list given to you today.   

## 2012-05-11 NOTE — Assessment & Plan Note (Signed)
Multivessel status post CABG with graft disease.

## 2012-06-24 ENCOUNTER — Ambulatory Visit (INDEPENDENT_AMBULATORY_CARE_PROVIDER_SITE_OTHER): Payer: Medicare Other | Admitting: Cardiology

## 2012-06-24 ENCOUNTER — Encounter: Payer: Self-pay | Admitting: Cardiology

## 2012-06-24 VITALS — BP 131/82 | HR 82 | Ht 68.0 in | Wt 227.4 lb

## 2012-06-24 DIAGNOSIS — I2510 Atherosclerotic heart disease of native coronary artery without angina pectoris: Secondary | ICD-10-CM

## 2012-06-24 DIAGNOSIS — I10 Essential (primary) hypertension: Secondary | ICD-10-CM

## 2012-06-24 NOTE — Assessment & Plan Note (Signed)
For now we will continue medical therapy and observation. If he develops escalating symptoms, we can always consider cardiac catheterization, however it is not clear that there will be definitive revascularization options based on his previous angiogram. Followup arranged in the next 3 months.

## 2012-06-24 NOTE — Progress Notes (Signed)
Clinical Summary Elijah Santana is a 78 y.o.male presenting for followup. He was seen in June. He still reports dyspnea on exertion, although intermittently, particularly if he rushes doing something. Has had some atypical left-sided chest pain that seems to be more sporadic, also intermittent cough. He reports compliance with medications. We continue to discuss his overall cardiac status, and for now he is most comfortable with observation rather than pursuing cardiac catheterization. As noted previously, he does have multivessel disease status post CABG, and by prior catheterization some ischemic zones that were not amenable to revascularization.   No Known Allergies  Current Outpatient Prescriptions  Medication Sig Dispense Refill  . albuterol (PROVENTIL HFA;VENTOLIN HFA) 108 (90 BASE) MCG/ACT inhaler Inhale 1 puff into the lungs as needed. Shortness of breath      . amLODipine (NORVASC) 10 MG tablet Take 1 tablet (10 mg total) by mouth daily.  90 tablet  3  . aspirin 325 MG tablet Take 325 mg by mouth daily.        . budesonide-formoterol (SYMBICORT) 160-4.5 MCG/ACT inhaler Inhale 2 puffs into the lungs as needed. Shortness of breath      . isosorbide mononitrate (IMDUR) 120 MG 24 hr tablet Take 1 tablet (120 mg total) by mouth daily.  30 tablet  0  . megestrol (MEGACE) 40 MG tablet Take 20 mg by mouth daily.       . metoprolol (LOPRESSOR) 50 MG tablet Take 1.5 tablets (75 mg total) by mouth 2 (two) times daily.  180 tablet  3  . NITROSTAT 0.4 MG SL tablet PLACE ONE (1) TABLET UNDER TONGUE EVERY 5 MINUTES UP TO (3) DOSES AS NEEDED FOR CHEST PAIN.  25 each  2  . omeprazole (PRILOSEC) 40 MG capsule Take 1 capsule (40 mg total) by mouth daily.  30 capsule  0  . simvastatin (ZOCOR) 40 MG tablet Take 1 tablet (40 mg total) by mouth at bedtime.  90 tablet  3  . Tamsulosin HCl (FLOMAX) 0.4 MG CAPS Take 0.4 mg by mouth daily.        . isosorbide mononitrate (IMDUR) 60 MG 24 hr tablet Take 120 mg by  mouth daily.        Past Medical History  Diagnosis Date  . Coronary atherosclerosis of native coronary artery     Multivessel, LVEF 55%  . Chronic back pain   . Mixed hyperlipidemia   . Essential hypertension, benign   . Myocardial infarction   . Prostate cancer     Radiation therapy 1996    Social History Elijah Santana reports that he has never smoked. He quit smokeless tobacco use about 22 months ago. His smokeless tobacco use included Chew. Elijah Santana reports that he does not drink alcohol.  Review of Systems No palpitations or syncope. No reported bleeding problems. Otherwise negative.  Physical Examination Filed Vitals:   06/24/12 0945  BP: 131/82  Pulse: 82    Overweight male in no acute distress.  HEENT: Conjunctiva and lids normal, oropharynx with moist mucosa.  Neck: Supple, no elevated jugular venous pressure or bruits.  Lungs: Clear, diminished breath sounds, nonlabored.  Cardiac: Regular rate and rhythm, no S3.  Abdomen: Soft, nontender, bowel sounds present.  Extremities: No pitting edema, distal pulses 1-2+.    Problem List and Plan   CORONARY ATHEROSCLEROSIS NATIVE CORONARY ARTERY For now we will continue medical therapy and observation. If he develops escalating symptoms, we can always consider cardiac catheterization, however it is not clear  that there will be definitive revascularization options based on his previous angiogram. Followup arranged in the next 3 months.  Essential hypertension, benign Blood pressure is reasonable today.    Jonelle Sidle, M.D., F.A.C.C.

## 2012-06-24 NOTE — Assessment & Plan Note (Signed)
Blood pressure is reasonable today. 

## 2012-06-24 NOTE — Patient Instructions (Addendum)
Your physician recommends that you schedule a follow-up appointment in: 3 months with Dr. [facility].  Your physician recommends that you continue on your current medications as directed. Please refer to the Current Medication list given to you today.   

## 2012-08-05 ENCOUNTER — Ambulatory Visit (INDEPENDENT_AMBULATORY_CARE_PROVIDER_SITE_OTHER): Payer: Medicare Other | Admitting: Urology

## 2012-08-05 DIAGNOSIS — C61 Malignant neoplasm of prostate: Secondary | ICD-10-CM

## 2012-08-05 DIAGNOSIS — N401 Enlarged prostate with lower urinary tract symptoms: Secondary | ICD-10-CM

## 2012-08-05 DIAGNOSIS — R351 Nocturia: Secondary | ICD-10-CM

## 2012-09-28 ENCOUNTER — Ambulatory Visit (INDEPENDENT_AMBULATORY_CARE_PROVIDER_SITE_OTHER): Payer: Medicare Other | Admitting: Cardiology

## 2012-09-28 ENCOUNTER — Encounter: Payer: Self-pay | Admitting: Cardiology

## 2012-09-28 VITALS — BP 135/85 | HR 68 | Ht 68.0 in | Wt 224.1 lb

## 2012-09-28 DIAGNOSIS — I2510 Atherosclerotic heart disease of native coronary artery without angina pectoris: Secondary | ICD-10-CM

## 2012-09-28 DIAGNOSIS — I10 Essential (primary) hypertension: Secondary | ICD-10-CM

## 2012-09-28 MED ORDER — ISOSORBIDE MONONITRATE ER 60 MG PO TB24: 60.0000 mg | ORAL_TABLET | Freq: Every morning | ORAL | Status: DC

## 2012-09-28 NOTE — Assessment & Plan Note (Signed)
Stable angina and shortness of breath. Will try to advance Imdur to see if this further suppress his symptoms. Otherwise continue observation for now.

## 2012-09-28 NOTE — Patient Instructions (Addendum)
Your physician recommends that you schedule a follow-up appointment in: 3 months.  Your physician has recommended you make the following change in your medication: Increased isosorbide mononitrate to 60 mg in the morning and 30 mg in the evening. All other medications will remain the same. Your new prescription has been sent to your pharmacy.

## 2012-09-28 NOTE — Assessment & Plan Note (Signed)
No change to current antihypertensives.

## 2012-09-28 NOTE — Progress Notes (Signed)
Clinical Summary Elijah Santana is a 78 y.o.male presenting for followup. He was seen in August. He reports no change in frequency or intensity of angina and shortness of breath with activity. He has used nitroglycerin twice since his last visit.  We reviewed his medications. We talked about advancing his Imdur dose, otherwise keeping the regimen stable. He voices comfort with continued followup for now, can always consider further invasive evaluation if his symptoms progress.   No Known Allergies  Current Outpatient Prescriptions  Medication Sig Dispense Refill  . albuterol (PROVENTIL HFA;VENTOLIN HFA) 108 (90 BASE) MCG/ACT inhaler Inhale 1 puff into the lungs as needed. Shortness of breath      . amLODipine (NORVASC) 10 MG tablet Take 1 tablet (10 mg total) by mouth daily.  90 tablet  3  . aspirin 325 MG tablet Take 325 mg by mouth daily.        . budesonide-formoterol (SYMBICORT) 160-4.5 MCG/ACT inhaler Inhale 2 puffs into the lungs as needed. Shortness of breath      . doxazosin (CARDURA) 4 MG tablet Take 4 mg by mouth daily.      . isosorbide mononitrate (IMDUR) 60 MG 24 hr tablet Take 1 tablet (60 mg total) by mouth every morning. & 30 mg (1/2) tablet in the evening  140 tablet  1  . megestrol (MEGACE) 40 MG tablet Take 20 mg by mouth daily.      . metoprolol (LOPRESSOR) 50 MG tablet Take 50 mg by mouth 2 (two) times daily.      Marland Kitchen NITROSTAT 0.4 MG SL tablet PLACE ONE (1) TABLET UNDER TONGUE EVERY 5 MINUTES UP TO (3) DOSES AS NEEDED FOR CHEST PAIN.  25 each  2  . omeprazole (PRILOSEC) 40 MG capsule Take 1 capsule (40 mg total) by mouth daily.  30 capsule  0  . simvastatin (ZOCOR) 40 MG tablet Take 1 tablet (40 mg total) by mouth at bedtime.  90 tablet  3  . Tamsulosin HCl (FLOMAX) 0.4 MG CAPS Take 0.4 mg by mouth daily.        . [DISCONTINUED] isosorbide mononitrate (IMDUR) 60 MG 24 hr tablet Take 60 mg by mouth every morning. & 30 mg (1/2) tablet in the evening      . [DISCONTINUED]  metoprolol (LOPRESSOR) 50 MG tablet Take 1.5 tablets (75 mg total) by mouth 2 (two) times daily.  180 tablet  3  . [DISCONTINUED] isosorbide mononitrate (IMDUR) 120 MG 24 hr tablet Take 1 tablet (120 mg total) by mouth daily.  30 tablet  0    Past Medical History  Diagnosis Date  . Coronary atherosclerosis of native coronary artery     Multivessel, LVEF 55%  . Chronic back pain   . Mixed hyperlipidemia   . Essential hypertension, benign   . Myocardial infarction   . Prostate cancer     Radiation therapy 1996    Past Surgical History  Procedure Date  . Coronary artery bypass graft 2004    LIMA to LAD, SVG to diagonal, SVG to circumflex, SVG to PDA  . Coronary stent placement     Social History Elijah Santana reports that he has never smoked. He quit smokeless tobacco use about 2 years ago. His smokeless tobacco use included Chew. Elijah Santana reports that he does not drink alcohol.  Review of Systems No palpitations, no reported bleeding episodes. Stable appetite. No orthopnea or PND. Otherwise negative.  Physical Examination Filed Vitals:   09/28/12 0848  BP: 135/85  Pulse: 68   Filed Weights   09/28/12 0848  Weight: 224 lb 1.9 oz (101.66 kg)   Overweight male in no acute distress.  HEENT: Conjunctiva and lids normal, oropharynx with moist mucosa.  Neck: Supple, no elevated jugular venous pressure or bruits.  Lungs: Clear, diminished breath sounds, nonlabored.  Cardiac: Regular rate and rhythm, no S3.  Abdomen: Soft, nontender, bowel sounds present.  Extremities: No pitting edema, distal pulses 1-2+.    Problem List and Plan   CORONARY ATHEROSCLEROSIS NATIVE CORONARY ARTERY Stable angina and shortness of breath. Will try to advance Imdur to see if this further suppress his symptoms. Otherwise continue observation for now.  Essential hypertension, benign No change to current antihypertensives.    Jonelle Sidle, M.D., F.A.C.C.

## 2012-11-29 ENCOUNTER — Other Ambulatory Visit: Payer: Self-pay | Admitting: Cardiology

## 2012-11-29 MED ORDER — SIMVASTATIN 40 MG PO TABS: 40.0000 mg | ORAL_TABLET | Freq: Every day | ORAL | Status: DC

## 2012-11-29 NOTE — Telephone Encounter (Signed)
Patient called request refill for zocor. Sent refill request into [facility]

## 2012-12-16 ENCOUNTER — Ambulatory Visit (INDEPENDENT_AMBULATORY_CARE_PROVIDER_SITE_OTHER): Payer: Medicare Other | Admitting: Urology

## 2012-12-16 ENCOUNTER — Other Ambulatory Visit: Payer: Self-pay | Admitting: Urology

## 2012-12-16 DIAGNOSIS — N401 Enlarged prostate with lower urinary tract symptoms: Secondary | ICD-10-CM

## 2012-12-16 DIAGNOSIS — C61 Malignant neoplasm of prostate: Secondary | ICD-10-CM

## 2012-12-16 DIAGNOSIS — R351 Nocturia: Secondary | ICD-10-CM

## 2012-12-20 ENCOUNTER — Encounter (HOSPITAL_COMMUNITY): Payer: Self-pay

## 2012-12-20 ENCOUNTER — Encounter (HOSPITAL_COMMUNITY)
Admission: RE | Admit: 2012-12-20 | Discharge: 2012-12-20 | Disposition: A | Discharge status: Home / Self Care | Payer: Medicare Other | Source: Ambulatory Visit | Attending: Urology | Admitting: Urology

## 2012-12-20 ENCOUNTER — Ambulatory Visit (HOSPITAL_COMMUNITY)
Admission: RE | Admit: 2012-12-20 | Discharge: 2012-12-20 | Disposition: A | Discharge status: Home / Self Care | Payer: Medicare Other | Source: Ambulatory Visit | Attending: Urology | Admitting: Urology

## 2012-12-20 DIAGNOSIS — C61 Malignant neoplasm of prostate: Secondary | ICD-10-CM | POA: Insufficient documentation

## 2012-12-20 DIAGNOSIS — K5730 Diverticulosis of large intestine without perforation or abscess without bleeding: Secondary | ICD-10-CM | POA: Insufficient documentation

## 2012-12-20 DIAGNOSIS — Q619 Cystic kidney disease, unspecified: Secondary | ICD-10-CM | POA: Insufficient documentation

## 2012-12-20 LAB — POCT I-STAT, CHEM 8
Calcium, Ion: 1.19 mmol/L (ref 1.13–1.30)
Glucose, Bld: 123 mg/dL — ABNORMAL HIGH (ref 70–99)
HCT: 38 % — ABNORMAL LOW (ref 39.0–52.0)
Hemoglobin: 12.9 g/dL — ABNORMAL LOW (ref 13.0–17.0)
Potassium: 3.4 mEq/L — ABNORMAL LOW (ref 3.5–5.1)
TCO2: 19 mmol/L (ref 0–100)

## 2012-12-20 MED ORDER — TECHNETIUM TC 99M MEDRONATE IV KIT
25.0000 | PACK | Freq: Once | INTRAVENOUS | Status: AC | PRN
  Administered 2012-12-20: 25 via INTRAVENOUS

## 2012-12-20 MED ORDER — IOHEXOL 300 MG/ML  SOLN
100.0000 mL | Freq: Once | INTRAMUSCULAR | Status: AC | PRN
  Administered 2012-12-20: 100 mL via INTRAVENOUS

## 2012-12-20 NOTE — Progress Notes (Signed)
Blood sample obtained from right arm IV for Creatnine level.  

## 2012-12-21 ENCOUNTER — Ambulatory Visit (HOSPITAL_COMMUNITY): Payer: Medicare Other

## 2012-12-30 ENCOUNTER — Ambulatory Visit: Payer: Medicare Other | Admitting: Cardiology

## 2013-01-13 ENCOUNTER — Ambulatory Visit (INDEPENDENT_AMBULATORY_CARE_PROVIDER_SITE_OTHER): Payer: Medicare Other | Admitting: Urology

## 2013-01-13 DIAGNOSIS — N401 Enlarged prostate with lower urinary tract symptoms: Secondary | ICD-10-CM

## 2013-01-17 ENCOUNTER — Other Ambulatory Visit: Payer: Self-pay | Admitting: Cardiology

## 2013-01-17 MED ORDER — ISOSORBIDE MONONITRATE ER 60 MG PO TB24: 60.0000 mg | ORAL_TABLET | Freq: Every morning | ORAL | Status: DC

## 2013-02-16 ENCOUNTER — Ambulatory Visit (INDEPENDENT_AMBULATORY_CARE_PROVIDER_SITE_OTHER): Payer: Medicare Other | Admitting: Cardiology

## 2013-02-16 ENCOUNTER — Encounter: Payer: Self-pay | Admitting: Cardiology

## 2013-02-16 VITALS — BP 122/72 | HR 70 | Ht 68.0 in | Wt 227.1 lb

## 2013-02-16 DIAGNOSIS — I2510 Atherosclerotic heart disease of native coronary artery without angina pectoris: Secondary | ICD-10-CM

## 2013-02-16 DIAGNOSIS — E782 Mixed hyperlipidemia: Secondary | ICD-10-CM

## 2013-02-16 DIAGNOSIS — I10 Essential (primary) hypertension: Secondary | ICD-10-CM

## 2013-02-16 MED ORDER — NITROGLYCERIN 0.4 MG SL SUBL: SUBLINGUAL_TABLET | SUBLINGUAL | Status: DC

## 2013-02-16 NOTE — Assessment & Plan Note (Signed)
Continues on simvastatin. Keep followup with Dr. Sherril Croon.

## 2013-02-16 NOTE — Progress Notes (Signed)
Clinical Summary [name] is a 79 y.o.male last seen in November 2013. He reports no progressive angina on medical therapy. We reviewed his medications. He reports compliance with them. Does need a refill for nitroglycerin.  He stays active with his ADLs, nothing too strenuous. Reports no palpitations, no dizziness or syncope.   No Known Allergies  Current Outpatient Prescriptions  Medication Sig Dispense Refill  . amLODipine (NORVASC) 10 MG tablet Take 1 tablet (10 mg total) by mouth daily.  90 tablet  3  . aspirin 325 MG tablet Take 325 mg by mouth daily.        . bicalutamide (CASODEX) 50 MG tablet Take 50 mg by mouth daily.      Marland Kitchen doxazosin (CARDURA) 4 MG tablet Take 4 mg by mouth daily.      . isosorbide mononitrate (IMDUR) 60 MG 24 hr tablet Take 1 tablet (60 mg total) by mouth every morning. & 30 mg (1/2) tablet in the evening  140 tablet  2  . megestrol (MEGACE) 40 MG tablet Take 20 mg by mouth daily.      . metoprolol (LOPRESSOR) 50 MG tablet Take 50 mg by mouth 2 (two) times daily.      . nitroGLYCERIN (NITROSTAT) 0.4 MG SL tablet PLACE ONE (1) TABLET UNDER TONGUE EVERY 5 MINUTES UP TO (3) DOSES AS NEEDED FOR CHEST PAIN. IF NO RELIEF AFTER THIRD DOSE, PROCEED TO ED FOR EVALUATION  25 tablet  3  . omeprazole (PRILOSEC) 40 MG capsule Take 1 capsule (40 mg total) by mouth daily.  30 capsule  0  . simvastatin (ZOCOR) 40 MG tablet Take 1 tablet (40 mg total) by mouth at bedtime.  90 tablet  1  . Tamsulosin HCl (FLOMAX) 0.4 MG CAPS Take 0.4 mg by mouth daily.        Marland Kitchen albuterol (PROVENTIL HFA;VENTOLIN HFA) 108 (90 BASE) MCG/ACT inhaler Inhale 1 puff into the lungs as needed. Shortness of breath      . budesonide-formoterol (SYMBICORT) 160-4.5 MCG/ACT inhaler Inhale 2 puffs into the lungs as needed. Shortness of breath       No current facility-administered medications for this visit.    Past Medical History  Diagnosis Date  . Coronary atherosclerosis of native coronary artery     Multivessel, LVEF 55%  . Chronic back pain   . Mixed hyperlipidemia   . Essential hypertension, benign   . Myocardial infarction   . Prostate cancer     Radiation therapy 1996    Past Surgical History  Procedure Laterality Date  . Coronary artery bypass graft  2004    LIMA to LAD, SVG to diagonal, SVG to circumflex, SVG to PDA  . Coronary stent placement      Social History [name] reports that he has never smoked. He quit smokeless tobacco use about 2 years ago. His smokeless tobacco use included Chew. [name] reports that he does not drink alcohol.  Review of Systems Had a fall, tripped without loss of consciousness. Some soreness of his chest wall. Seems to be getting better. Otherwise negative.  Physical Examination Filed Vitals:   02/16/13 0845  BP: 122/72  Pulse: 70   Filed Weights   02/16/13 0845  Weight: 227 lb 1.9 oz (103.021 kg)    Overweight male in no acute distress.  HEENT: Conjunctiva and lids normal, oropharynx with moist mucosa.  Neck: Supple, no elevated jugular venous pressure or bruits.  Lungs: Clear, diminished breath sounds, nonlabored.  Cardiac: Regular rate and rhythm, no S3.  Abdomen: Soft, nontender, bowel sounds present.  Extremities: No pitting edema, distal pulses 1-2+.    Problem List and Plan   CORONARY ATHEROSCLEROSIS NATIVE CORONARY ARTERY Stable angina symptoms on medical therapy. No change to current regimen. Refill for nitroglycerin.  Essential hypertension, benign Blood pressure is well-controlled today.  Mixed hyperlipidemia Continues on simvastatin. Keep followup with Dr. Sherril Croon.    Jonelle Sidle, M.D., F.A.C.C.

## 2013-02-16 NOTE — Assessment & Plan Note (Signed)
Blood pressure is well-controlled today. 

## 2013-02-16 NOTE — Patient Instructions (Addendum)
Your physician recommends that you schedule a follow-up appointment in: 3 months. Your physician recommends that you continue on your current medications as directed. Please refer to the Current Medication list given to you today. 

## 2013-02-16 NOTE — Assessment & Plan Note (Signed)
Stable angina symptoms on medical therapy. No change to current regimen. Refill for nitroglycerin.

## 2013-02-24 ENCOUNTER — Encounter: Payer: Self-pay | Admitting: Cardiology

## 2013-04-14 ENCOUNTER — Ambulatory Visit (INDEPENDENT_AMBULATORY_CARE_PROVIDER_SITE_OTHER): Payer: Medicare Other | Admitting: Urology

## 2013-04-14 DIAGNOSIS — N401 Enlarged prostate with lower urinary tract symptoms: Secondary | ICD-10-CM

## 2013-04-14 DIAGNOSIS — R339 Retention of urine, unspecified: Secondary | ICD-10-CM

## 2013-04-14 DIAGNOSIS — C61 Malignant neoplasm of prostate: Secondary | ICD-10-CM

## 2013-05-24 ENCOUNTER — Ambulatory Visit (INDEPENDENT_AMBULATORY_CARE_PROVIDER_SITE_OTHER): Payer: Medicare Other | Admitting: Cardiology

## 2013-05-24 ENCOUNTER — Encounter: Payer: Self-pay | Admitting: Cardiology

## 2013-05-24 VITALS — BP 120/72 | HR 66 | Ht 68.0 in | Wt 230.1 lb

## 2013-05-24 DIAGNOSIS — I2510 Atherosclerotic heart disease of native coronary artery without angina pectoris: Secondary | ICD-10-CM

## 2013-05-24 DIAGNOSIS — I10 Essential (primary) hypertension: Secondary | ICD-10-CM

## 2013-05-24 NOTE — Patient Instructions (Addendum)
Your physician recommends that you schedule a follow-up appointment in: 6 MONTHS IN [facility] WITH [facility]

## 2013-05-24 NOTE — Progress Notes (Signed)
Clinical Summary [name] is a 79 y.o.male last seen in April. He reports that he has actually been feeling better since last visit, angina frequency has been less significant. He reports compliance with his medications.  Prior Cardiolite from 2010 showed evidence of lateral ischemia with LVEF of 58%. Cardiac catheterization at that point revealed patent bypass grafts with the exception of the vein graft to the diagonal branch of the LAD. He was noted to have some sources of ischemia in the distal LAD and circumflex distribution, however they were not amenable to revascularization. We have been managing him medically over time.   No Known Allergies  Current Outpatient Prescriptions  Medication Sig Dispense Refill  . albuterol (PROVENTIL HFA;VENTOLIN HFA) 108 (90 BASE) MCG/ACT inhaler Inhale 1 puff into the lungs as needed. Shortness of breath      . amLODipine (NORVASC) 10 MG tablet Take 1 tablet (10 mg total) by mouth daily.  90 tablet  3  . aspirin 325 MG tablet Take 325 mg by mouth daily.        . bicalutamide (CASODEX) 50 MG tablet Take 50 mg by mouth daily.      . budesonide-formoterol (SYMBICORT) 160-4.5 MCG/ACT inhaler Inhale 2 puffs into the lungs as needed. Shortness of breath      . doxazosin (CARDURA) 4 MG tablet Take 4 mg by mouth daily.      . isosorbide mononitrate (IMDUR) 60 MG 24 hr tablet Take 1 tablet (60 mg total) by mouth every morning. & 30 mg (1/2) tablet in the evening  140 tablet  2  . megestrol (MEGACE) 40 MG tablet Take 20 mg by mouth daily.      . metoprolol (LOPRESSOR) 50 MG tablet Take 50 mg by mouth 2 (two) times daily.      . nitroGLYCERIN (NITROSTAT) 0.4 MG SL tablet PLACE ONE (1) TABLET UNDER TONGUE EVERY 5 MINUTES UP TO (3) DOSES AS NEEDED FOR CHEST PAIN. IF NO RELIEF AFTER THIRD DOSE, PROCEED TO ED FOR EVALUATION  25 tablet  3  . simvastatin (ZOCOR) 40 MG tablet Take 1 tablet (40 mg total) by mouth at bedtime.  90 tablet  1  . Tamsulosin HCl (FLOMAX) 0.4 MG  CAPS Take 0.4 mg by mouth daily.        Marland Kitchen omeprazole (PRILOSEC) 40 MG capsule Take 1 capsule (40 mg total) by mouth daily.  30 capsule  0   No current facility-administered medications for this visit.    Past Medical History  Diagnosis Date  . Coronary atherosclerosis of native coronary artery     Multivessel, LVEF 55%  . Chronic back pain   . Mixed hyperlipidemia   . Essential hypertension, benign   . Myocardial infarction   . Prostate cancer     Radiation therapy 1996    Past Surgical History  Procedure Laterality Date  . Coronary artery bypass graft  2004    LIMA to LAD, SVG to diagonal, SVG to circumflex, SVG to PDA  . Coronary stent placement      Social History [name] reports that he has never smoked. He quit smokeless tobacco use about 2 years ago. His smokeless tobacco use included Chew. [name] reports that he does not drink alcohol.  Review of Systems No palpitations or syncope.  Physical Examination Filed Vitals:   05/24/13 1535  BP: 120/72  Pulse: 66   Filed Weights   05/24/13 1535  Weight: 230 lb 1.9 oz (104.382 kg)  Overweight male in no acute distress.  HEENT: Conjunctiva and lids normal, oropharynx with moist mucosa.  Neck: Supple, no elevated jugular venous pressure or bruits.  Lungs: Clear, diminished breath sounds, nonlabored.  Cardiac: Regular rate and rhythm, no S3.  Abdomen: Soft, nontender, bowel sounds present.  Extremities: No pitting edema, distal pulses 1-2+.    Problem List and Plan   CORONARY ATHEROSCLEROSIS NATIVE CORONARY ARTERY Clinically stable as outlined above. Plan to continue medical therapy and observation. Followup arranged.  Essential hypertension, benign Good blood pressure control today. No changes made.    Jonelle Sidle, M.D., F.A.C.C.

## 2013-05-24 NOTE — Assessment & Plan Note (Signed)
Clinically stable as outlined above. Plan to continue medical therapy and observation. Followup arranged.

## 2013-05-24 NOTE — Assessment & Plan Note (Signed)
Good blood pressure control today. No changes made. 

## 2013-08-11 ENCOUNTER — Ambulatory Visit (INDEPENDENT_AMBULATORY_CARE_PROVIDER_SITE_OTHER): Payer: Medicare Other | Admitting: Urology

## 2013-08-11 DIAGNOSIS — C61 Malignant neoplasm of prostate: Secondary | ICD-10-CM

## 2013-08-11 DIAGNOSIS — R339 Retention of urine, unspecified: Secondary | ICD-10-CM

## 2013-08-11 DIAGNOSIS — N401 Enlarged prostate with lower urinary tract symptoms: Secondary | ICD-10-CM

## 2013-08-15 ENCOUNTER — Telehealth: Payer: Self-pay | Admitting: Cardiology

## 2013-08-15 ENCOUNTER — Ambulatory Visit (INDEPENDENT_AMBULATORY_CARE_PROVIDER_SITE_OTHER): Payer: Medicare Other | Admitting: Urology

## 2013-08-15 DIAGNOSIS — C61 Malignant neoplasm of prostate: Secondary | ICD-10-CM

## 2013-08-15 NOTE — Telephone Encounter (Signed)
Patient states that he has called and pharmacy has called for refill.  There is not documentation of neither.  Please call patient. / tgs

## 2013-08-15 NOTE — Telephone Encounter (Signed)
.  left message to have patient return my call.  

## 2013-08-16 ENCOUNTER — Other Ambulatory Visit: Payer: Self-pay | Admitting: *Deleted

## 2013-08-16 MED ORDER — SIMVASTATIN 40 MG PO TABS: 40.0000 mg | ORAL_TABLET | Freq: Every day | ORAL | Status: DC

## 2013-08-16 NOTE — Telephone Encounter (Signed)
rx sent to pharmacy by e-script Pt made aware

## 2013-08-28 ENCOUNTER — Other Ambulatory Visit: Payer: Self-pay | Admitting: *Deleted

## 2013-08-28 MED ORDER — SIMVASTATIN 40 MG PO TABS: 40.0000 mg | ORAL_TABLET | Freq: Every day | ORAL | Status: DC

## 2013-12-15 ENCOUNTER — Ambulatory Visit (INDEPENDENT_AMBULATORY_CARE_PROVIDER_SITE_OTHER): Payer: Medicare Other | Admitting: Urology

## 2013-12-15 DIAGNOSIS — R351 Nocturia: Secondary | ICD-10-CM

## 2013-12-15 DIAGNOSIS — N138 Other obstructive and reflux uropathy: Secondary | ICD-10-CM

## 2013-12-15 DIAGNOSIS — N3943 Post-void dribbling: Secondary | ICD-10-CM

## 2013-12-15 DIAGNOSIS — N401 Enlarged prostate with lower urinary tract symptoms: Secondary | ICD-10-CM

## 2013-12-15 DIAGNOSIS — C61 Malignant neoplasm of prostate: Secondary | ICD-10-CM

## 2014-02-23 ENCOUNTER — Encounter: Payer: Self-pay | Admitting: Cardiology

## 2014-02-23 ENCOUNTER — Ambulatory Visit (INDEPENDENT_AMBULATORY_CARE_PROVIDER_SITE_OTHER): Payer: Medicare Other | Admitting: Cardiology

## 2014-02-23 VITALS — BP 117/74 | HR 67 | Ht 68.0 in | Wt 230.4 lb

## 2014-02-23 DIAGNOSIS — I2510 Atherosclerotic heart disease of native coronary artery without angina pectoris: Secondary | ICD-10-CM

## 2014-02-23 DIAGNOSIS — I10 Essential (primary) hypertension: Secondary | ICD-10-CM

## 2014-02-23 DIAGNOSIS — E782 Mixed hyperlipidemia: Secondary | ICD-10-CM

## 2014-02-23 DIAGNOSIS — Z01810 Encounter for preprocedural cardiovascular examination: Secondary | ICD-10-CM

## 2014-02-23 NOTE — Assessment & Plan Note (Signed)
Blood pressure is normal today. 

## 2014-02-23 NOTE — Patient Instructions (Signed)

## 2014-02-23 NOTE — Assessment & Plan Note (Signed)
Patient being considered for elective dental extractions under IV sedation with Dr. Tamela Oddi in [place]. From a cardiac perspective [name] has been stable on medical therapy, his ECG today is normal. I would anticipate that he should be able to proceed at relatively low cardiovascular risk. Would continue medical therapy throughout. Aspirin could be held temporarily if it is anticipated that he may have significant bleeding with his extractions. Otherwise will continue routine followup.

## 2014-02-23 NOTE — Assessment & Plan Note (Signed)
Patient continues on Zocor.

## 2014-02-23 NOTE — Progress Notes (Signed)
Clinical Summary Elijah Santana is a 80 y.o.male last seen in July 2014. He is being considered for elective dental extractions with IV sedation, Dr. Tamela Oddi in [place]. From a cardiac perspective, he has actually been doing reasonably well over the last year without progressive angina symptoms or hospitalizations. We have been managing him medically with known multivessel CAD as detailed below. ECG today shows normal sinus rhythm. He reports tolerating his medications.  Prior Cardiolite from 2010 showed evidence of lateral ischemia with LVEF of 58%. Cardiac catheterization at that point revealed patent bypass grafts with the exception of the vein graft to the diagonal branch of the LAD. He was noted to have some sources of ischemia in the distal LAD and circumflex distribution, however they were not amenable to revascularization. We have been managing him medically over time.   No Known Allergies  Current Outpatient Prescriptions  Medication Sig Dispense Refill  . amLODipine (NORVASC) 10 MG tablet Take 1 tablet (10 mg total) by mouth daily.  90 tablet  3  . bicalutamide (CASODEX) 50 MG tablet Take 50 mg by mouth daily.      Marland Kitchen doxazosin (CARDURA) 4 MG tablet Take 4 mg by mouth daily.      Marland Kitchen HYDROcodone-acetaminophen (NORCO/VICODIN) 5-325 MG per tablet Take 1 tablet by mouth 2 (two) times daily.      . isosorbide mononitrate (IMDUR) 60 MG 24 hr tablet Take 1 tablet (60 mg total) by mouth every morning. & 30 mg (1/2) tablet in the evening  140 tablet  2  . megestrol (MEGACE) 40 MG tablet Take 20 mg by mouth daily.      . metoprolol (LOPRESSOR) 50 MG tablet Take 50 mg by mouth 2 (two) times daily.      . nitroGLYCERIN (NITROSTAT) 0.4 MG SL tablet PLACE ONE (1) TABLET UNDER TONGUE EVERY 5 MINUTES UP TO (3) DOSES AS NEEDED FOR CHEST PAIN. IF NO RELIEF AFTER THIRD DOSE, PROCEED TO ED FOR EVALUATION  25 tablet  3  . omeprazole (PRILOSEC) 40 MG capsule Take 1 capsule (40 mg total) by mouth  daily.  30 capsule  0  . simvastatin (ZOCOR) 40 MG tablet Take 1 tablet (40 mg total) by mouth at bedtime.  90 tablet  1  . Tamsulosin HCl (FLOMAX) 0.4 MG CAPS Take 0.4 mg by mouth daily.        Marland Kitchen aspirin 325 MG tablet Take 325 mg by mouth daily.         No current facility-administered medications for this visit.    Past Medical History  Diagnosis Date  . Coronary atherosclerosis of native coronary artery     Multivessel, LVEF 55%  . Chronic back pain   . Mixed hyperlipidemia   . Essential hypertension, benign   . Myocardial infarction   . Prostate cancer     Radiation therapy 1996    Past Surgical History  Procedure Laterality Date  . Coronary artery bypass graft  2004    LIMA to LAD, SVG to diagonal, SVG to circumflex, SVG to PDA    Family History  Problem Relation Age of Onset  . Hypertension    . Coronary artery disease      Social History Elijah Santana reports that he has never smoked. He quit smokeless tobacco use about 3 years ago. His smokeless tobacco use included Chew. Elijah Santana reports that he does not drink alcohol.  Review of Systems No palpitations, dizziness, syncope. No increasing nitroglycerin requirement. No  reported bleeding episodes. Intermittent back pain. Otherwise negative.  Physical Examination Filed Vitals:   02/23/14 0845  BP: 117/74  Pulse: 67   Filed Weights   02/23/14 0845  Weight: 230 lb 6.4 oz (104.509 kg)    Overweight male in no acute distress.  HEENT: Conjunctiva and lids normal, oropharynx clear, nearly edentulous..  Neck: Supple, no elevated jugular venous pressure or bruits.  Lungs: Clear, diminished breath sounds, nonlabored.  Cardiac: Regular rate and rhythm, no S3.  Abdomen: Soft, nontender, bowel sounds present.  Extremities: No pitting edema, distal pulses 1-2+.  Skin: Warm and dry. Musko skeletal: No kyphosis. Neuropsychiatric: Alert and oriented x3, affect appropriate.   Problem List and Plan   Preoperative  cardiovascular examination Patient being considered for elective dental extractions under IV sedation with Dr. Tamela Oddi in [place]. From a cardiac perspective Elijah Santana has been stable on medical therapy, his ECG today is normal. I would anticipate that he should be able to proceed at relatively low cardiovascular risk. Would continue medical therapy throughout. Aspirin could be held temporarily if it is anticipated that he may have significant bleeding with his extractions. Otherwise will continue routine followup.  CORONARY ATHEROSCLEROSIS NATIVE CORONARY ARTERY Multivessel disease status post prior CABG with known graft disease and ischemic territories that are being managed medically. He has been clinically stable.  Essential hypertension, benign Blood pressure is normal today.  Mixed hyperlipidemia Patient continues on Zocor.    Satira Sark, M.D., F.A.C.C.

## 2014-02-23 NOTE — Assessment & Plan Note (Signed)
Multivessel disease status post prior CABG with known graft disease and ischemic territories that are being managed medically. He has been clinically stable.

## 2014-03-12 ENCOUNTER — Telehealth: Payer: Self-pay | Admitting: *Deleted

## 2014-03-12 DIAGNOSIS — I779 Disorder of arteries and arterioles, unspecified: Secondary | ICD-10-CM

## 2014-03-12 DIAGNOSIS — I739 Peripheral vascular disease, unspecified: Principal | ICD-10-CM

## 2014-03-12 NOTE — Telephone Encounter (Signed)
Per Dr. Domenic Polite r/e letter from dentist, patient to have carotid doppler. Patient informed.

## 2014-03-15 ENCOUNTER — Encounter (INDEPENDENT_AMBULATORY_CARE_PROVIDER_SITE_OTHER): Payer: Medicare Other

## 2014-03-15 DIAGNOSIS — I739 Peripheral vascular disease, unspecified: Principal | ICD-10-CM

## 2014-03-15 DIAGNOSIS — I779 Disorder of arteries and arterioles, unspecified: Secondary | ICD-10-CM

## 2014-03-15 DIAGNOSIS — I6529 Occlusion and stenosis of unspecified carotid artery: Secondary | ICD-10-CM

## 2014-03-19 ENCOUNTER — Other Ambulatory Visit: Payer: Self-pay | Admitting: *Deleted

## 2014-03-19 MED ORDER — ISOSORBIDE MONONITRATE ER 60 MG PO TB24: 60.0000 mg | ORAL_TABLET | Freq: Every morning | ORAL | Status: DC

## 2014-03-22 ENCOUNTER — Telehealth: Payer: Self-pay | Admitting: *Deleted

## 2014-03-22 NOTE — Telephone Encounter (Signed)
Patient informed. 

## 2014-03-22 NOTE — Telephone Encounter (Signed)
Message copied by Merlene Laughter on Thu Mar 22, 2014  4:23 PM ------      Message from: Satira Sark      Created: Sat Mar 17, 2014  1:00 PM       Reviewed. Please let him know that there is only mild atherosclerotic plaque, 1-39% in the internal carotid arteries. Can be managed medically at this point as we have been. ------

## 2014-04-25 ENCOUNTER — Telehealth: Payer: Self-pay | Admitting: Cardiology

## 2014-04-25 NOTE — Telephone Encounter (Signed)
Please see refill bin / tgs  °

## 2014-04-26 ENCOUNTER — Other Ambulatory Visit: Payer: Self-pay | Admitting: *Deleted

## 2014-04-26 MED ORDER — ISOSORBIDE MONONITRATE ER 60 MG PO TB24
60.0000 mg | ORAL_TABLET | Freq: Every morning | ORAL | Status: DC
Start: 2014-04-26 — End: 2014-04-30

## 2014-04-26 MED ORDER — SIMVASTATIN 40 MG PO TABS: 40.0000 mg | ORAL_TABLET | Freq: Every day | ORAL | Status: DC

## 2014-04-26 NOTE — Telephone Encounter (Signed)
Medication sent to pharmacy  

## 2014-04-30 ENCOUNTER — Telehealth: Payer: Self-pay | Admitting: Cardiology

## 2014-04-30 ENCOUNTER — Other Ambulatory Visit: Payer: Self-pay | Admitting: *Deleted

## 2014-04-30 MED ORDER — SIMVASTATIN 40 MG PO TABS: 40.0000 mg | ORAL_TABLET | Freq: Every day | ORAL | Status: DC

## 2014-04-30 MED ORDER — ISOSORBIDE MONONITRATE ER 60 MG PO TB24: 60.0000 mg | ORAL_TABLET | Freq: Every morning | ORAL | Status: DC

## 2014-04-30 NOTE — Telephone Encounter (Signed)
Received fax refill request  Rx #  Medication:  Simvastatin tab 40 mg Qty 90 day supply Sig:  Take one by mouth at bedtime Physician:  Domenic Polite

## 2014-04-30 NOTE — Telephone Encounter (Signed)
Medication sent to pharmacy  

## 2014-05-04 ENCOUNTER — Ambulatory Visit (INDEPENDENT_AMBULATORY_CARE_PROVIDER_SITE_OTHER): Payer: Medicare Other | Admitting: Urology

## 2014-05-04 DIAGNOSIS — R351 Nocturia: Secondary | ICD-10-CM | POA: Diagnosis not present

## 2014-05-04 DIAGNOSIS — C61 Malignant neoplasm of prostate: Secondary | ICD-10-CM | POA: Diagnosis not present

## 2014-08-21 ENCOUNTER — Telehealth: Payer: Self-pay | Admitting: Cardiology

## 2014-08-21 NOTE — Telephone Encounter (Signed)
Left message on machine that there's no outstanding lab orders for him.

## 2014-08-21 NOTE — Telephone Encounter (Signed)
Elijah Santana called today wanting to know if he is due blood work before next visit with Dr. Domenic Polite.

## 2014-08-28 ENCOUNTER — Encounter: Payer: Medicare Other | Admitting: Cardiology

## 2014-08-28 ENCOUNTER — Encounter: Payer: Self-pay | Admitting: Cardiology

## 2014-08-28 NOTE — Progress Notes (Signed)
Patient cancelled   This encounter was created in error - please disregard. 

## 2014-09-07 ENCOUNTER — Ambulatory Visit (INDEPENDENT_AMBULATORY_CARE_PROVIDER_SITE_OTHER): Payer: Medicare Other | Admitting: Urology

## 2014-09-07 DIAGNOSIS — N401 Enlarged prostate with lower urinary tract symptoms: Secondary | ICD-10-CM

## 2014-09-07 DIAGNOSIS — C61 Malignant neoplasm of prostate: Secondary | ICD-10-CM

## 2014-09-07 DIAGNOSIS — R351 Nocturia: Secondary | ICD-10-CM

## 2014-09-20 ENCOUNTER — Ambulatory Visit (INDEPENDENT_AMBULATORY_CARE_PROVIDER_SITE_OTHER): Payer: Medicare Other | Admitting: Cardiology

## 2014-09-20 ENCOUNTER — Encounter: Payer: Self-pay | Admitting: Cardiology

## 2014-09-20 VITALS — BP 143/82 | HR 76 | Ht 68.0 in | Wt 233.0 lb

## 2014-09-20 DIAGNOSIS — E782 Mixed hyperlipidemia: Secondary | ICD-10-CM

## 2014-09-20 DIAGNOSIS — I10 Essential (primary) hypertension: Secondary | ICD-10-CM

## 2014-09-20 DIAGNOSIS — I25119 Atherosclerotic heart disease of native coronary artery with unspecified angina pectoris: Secondary | ICD-10-CM

## 2014-09-20 NOTE — Patient Instructions (Signed)

## 2014-09-20 NOTE — Assessment & Plan Note (Signed)
Symptomatically stable on present medical regimen. I have encouraged activity as tolerated.

## 2014-09-20 NOTE — Progress Notes (Signed)
Reason for visit: CAD  Clinical Summary Elijah Santana is an 80 y.o.male last seen in April. He is reporting fewer angina symptoms, although has not been as active. Reports problems with right knee pain and swelling that has limited his activity. He continues on his cardiac regimen including aspirin, Norvasc, Imdur, and a beta blocker. He tells me that Dr. Woody Seller put him on a diuretic to use intermittently as he has had some swelling in his right leg and knee.  Carotid Dopplers from April of this year showed 1-39% bilateral ICA stenoses.  Prior Cardiolite from 2010 showed evidence of lateral ischemia with LVEF of 58%. Cardiac catheterization at that point revealed patent bypass grafts with the exception of the vein graft to the diagonal branch of the LAD. He was noted to have some sources of ischemia in the distal LAD and circumflex distribution, however they were not amenable to revascularization. We have been managing him medically over time.   No Known Allergies  Current Outpatient Prescriptions  Medication Sig Dispense Refill  . amLODipine (NORVASC) 10 MG tablet Take 1 tablet (10 mg total) by mouth daily. 90 tablet 3  . aspirin 325 MG tablet Take 325 mg by mouth daily.      Marland Kitchen doxazosin (CARDURA) 4 MG tablet Take 4 mg by mouth daily.    Marland Kitchen HYDROcodone-acetaminophen (NORCO/VICODIN) 5-325 MG per tablet Take 1 tablet by mouth 2 (two) times daily.    . isosorbide mononitrate (IMDUR) 60 MG 24 hr tablet Take 1 tablet (60 mg total) by mouth every morning. & 30 mg (1/2) tablet in the evening 135 tablet 3  . metoprolol (LOPRESSOR) 50 MG tablet Take 50 mg by mouth 2 (two) times daily.    . nitroGLYCERIN (NITROSTAT) 0.4 MG SL tablet PLACE ONE (1) TABLET UNDER TONGUE EVERY 5 MINUTES UP TO (3) DOSES AS NEEDED FOR CHEST PAIN. IF NO RELIEF AFTER THIRD DOSE, PROCEED TO ED FOR EVALUATION 25 tablet 3  . omeprazole (PRILOSEC) 40 MG capsule Take 1 capsule (40 mg total) by mouth daily. 30 capsule 0  . simvastatin  (ZOCOR) 40 MG tablet Take 1 tablet (40 mg total) by mouth at bedtime. 90 tablet 3  . Tamsulosin HCl (FLOMAX) 0.4 MG CAPS Take 0.4 mg by mouth daily.       No current facility-administered medications for this visit.    Past Medical History  Diagnosis Date  . Coronary atherosclerosis of native coronary artery     Multivessel, LVEF 55%  . Chronic back pain   . Mixed hyperlipidemia   . Essential hypertension, benign   . Myocardial infarction   . Prostate cancer     Radiation therapy 1996    Past Surgical History  Procedure Laterality Date  . Coronary artery bypass graft  2004    LIMA to LAD, SVG to diagonal, SVG to circumflex, SVG to PDA    Social History Elijah Santana reports that he has never smoked. He quit smokeless tobacco use about 4 years ago. His smokeless tobacco use included Chew. Elijah Santana reports that he does not drink alcohol.  Review of Systems Complete review of systems negative except as otherwise outlined in the clinical summary and also the following.  Physical Examination Filed Vitals:   09/20/14 1441  BP: 143/82  Pulse: 76   Filed Weights   09/20/14 1441  Weight: 233 lb (105.688 kg)    Overweight male in no acute distress.  HEENT: Conjunctiva and lids normal, oropharynx clear, nearly edentulous.Marland Kitchen  Neck: Supple, no elevated jugular venous pressure or bruits.  Lungs: Clear, diminished breath sounds, nonlabored.  Cardiac: Regular rate and rhythm, no S3.  Abdomen: Soft, nontender, bowel sounds present.  Extremities: No pitting edema, distal pulses 1-2+.  Skin: Warm and dry. Musko skeletal: No kyphosis. Neuropsychiatric: Alert and oriented x3, affect appropriate.   Problem List and Plan   CORONARY ATHEROSCLEROSIS NATIVE CORONARY ARTERY Symptomatically stable on present medical regimen. I have encouraged activity as tolerated.  Mixed hyperlipidemia Patient continues on statin therapy, has had good lipid control over time.  Essential  hypertension, benign Blood pressure is mildly elevated today.    Satira Sark, M.D., F.A.C.C.

## 2014-09-20 NOTE — Assessment & Plan Note (Signed)
Blood pressure is mildly elevated today

## 2014-09-20 NOTE — Assessment & Plan Note (Signed)
Patient continues on statin therapy, has had good lipid control over time.

## 2014-12-30 ENCOUNTER — Encounter (HOSPITAL_COMMUNITY): Payer: Self-pay

## 2014-12-30 ENCOUNTER — Inpatient Hospital Stay (HOSPITAL_COMMUNITY)
Admission: EM | Admit: 2014-12-30 | Discharge: 2015-01-02 | DRG: 247 | Disposition: A | Discharge status: Home / Self Care | Payer: Medicare Other | Attending: Internal Medicine | Admitting: Internal Medicine

## 2014-12-30 ENCOUNTER — Emergency Department (HOSPITAL_COMMUNITY): Payer: Medicare Other

## 2014-12-30 DIAGNOSIS — E782 Mixed hyperlipidemia: Secondary | ICD-10-CM | POA: Diagnosis present

## 2014-12-30 DIAGNOSIS — Y832 Surgical operation with anastomosis, bypass or graft as the cause of abnormal reaction of the patient, or of later complication, without mention of misadventure at the time of the procedure: Secondary | ICD-10-CM | POA: Diagnosis present

## 2014-12-30 DIAGNOSIS — Z791 Long term (current) use of non-steroidal anti-inflammatories (NSAID): Secondary | ICD-10-CM

## 2014-12-30 DIAGNOSIS — I214 Non-ST elevation (NSTEMI) myocardial infarction: Principal | ICD-10-CM | POA: Diagnosis present

## 2014-12-30 DIAGNOSIS — R0789 Other chest pain: Secondary | ICD-10-CM

## 2014-12-30 DIAGNOSIS — I517 Cardiomegaly: Secondary | ICD-10-CM | POA: Diagnosis present

## 2014-12-30 DIAGNOSIS — Z8546 Personal history of malignant neoplasm of prostate: Secondary | ICD-10-CM | POA: Diagnosis not present

## 2014-12-30 DIAGNOSIS — Z955 Presence of coronary angioplasty implant and graft: Secondary | ICD-10-CM | POA: Insufficient documentation

## 2014-12-30 DIAGNOSIS — I25110 Atherosclerotic heart disease of native coronary artery with unstable angina pectoris: Secondary | ICD-10-CM | POA: Diagnosis present

## 2014-12-30 DIAGNOSIS — R7302 Impaired glucose tolerance (oral): Secondary | ICD-10-CM | POA: Diagnosis present

## 2014-12-30 DIAGNOSIS — Z7982 Long term (current) use of aspirin: Secondary | ICD-10-CM

## 2014-12-30 DIAGNOSIS — I25810 Atherosclerosis of coronary artery bypass graft(s) without angina pectoris: Secondary | ICD-10-CM | POA: Diagnosis present

## 2014-12-30 DIAGNOSIS — F17220 Nicotine dependence, chewing tobacco, uncomplicated: Secondary | ICD-10-CM | POA: Diagnosis present

## 2014-12-30 DIAGNOSIS — Z8249 Family history of ischemic heart disease and other diseases of the circulatory system: Secondary | ICD-10-CM | POA: Diagnosis not present

## 2014-12-30 DIAGNOSIS — N400 Enlarged prostate without lower urinary tract symptoms: Secondary | ICD-10-CM | POA: Diagnosis present

## 2014-12-30 DIAGNOSIS — Z79899 Other long term (current) drug therapy: Secondary | ICD-10-CM | POA: Diagnosis not present

## 2014-12-30 DIAGNOSIS — Z923 Personal history of irradiation: Secondary | ICD-10-CM

## 2014-12-30 DIAGNOSIS — I10 Essential (primary) hypertension: Secondary | ICD-10-CM | POA: Diagnosis present

## 2014-12-30 DIAGNOSIS — T82897A Other specified complication of cardiac prosthetic devices, implants and grafts, initial encounter: Secondary | ICD-10-CM | POA: Diagnosis present

## 2014-12-30 DIAGNOSIS — K219 Gastro-esophageal reflux disease without esophagitis: Secondary | ICD-10-CM | POA: Diagnosis present

## 2014-12-30 DIAGNOSIS — I252 Old myocardial infarction: Secondary | ICD-10-CM

## 2014-12-30 DIAGNOSIS — Z951 Presence of aortocoronary bypass graft: Secondary | ICD-10-CM

## 2014-12-30 DIAGNOSIS — Z7902 Long term (current) use of antithrombotics/antiplatelets: Secondary | ICD-10-CM

## 2014-12-30 DIAGNOSIS — I2582 Chronic total occlusion of coronary artery: Secondary | ICD-10-CM | POA: Diagnosis present

## 2014-12-30 DIAGNOSIS — R079 Chest pain, unspecified: Secondary | ICD-10-CM | POA: Diagnosis present

## 2014-12-30 HISTORY — DX: Non-ST elevation (NSTEMI) myocardial infarction: I21.4

## 2014-12-30 LAB — CBC WITH DIFFERENTIAL/PLATELET
BASOS ABS: 0 10*3/uL (ref 0.0–0.1)
BASOS PCT: 0 % (ref 0–1)
EOS PCT: 3 % (ref 0–5)
Eosinophils Absolute: 0.2 10*3/uL (ref 0.0–0.7)
HCT: 40.4 % (ref 39.0–52.0)
Hemoglobin: 13.2 g/dL (ref 13.0–17.0)
Lymphocytes Relative: 18 % (ref 12–46)
Lymphs Abs: 1.2 10*3/uL (ref 0.7–4.0)
MCH: 29.3 pg (ref 26.0–34.0)
MCHC: 32.7 g/dL (ref 30.0–36.0)
MCV: 89.6 fL (ref 78.0–100.0)
MONOS PCT: 6 % (ref 3–12)
Monocytes Absolute: 0.4 10*3/uL (ref 0.1–1.0)
Neutro Abs: 5.1 10*3/uL (ref 1.7–7.7)
Neutrophils Relative %: 73 % (ref 43–77)
PLATELETS: 200 10*3/uL (ref 150–400)
RBC: 4.51 MIL/uL (ref 4.22–5.81)
RDW: 13.7 % (ref 11.5–15.5)
WBC: 6.9 10*3/uL (ref 4.0–10.5)

## 2014-12-30 LAB — BASIC METABOLIC PANEL
ANION GAP: 4 — AB (ref 5–15)
BUN: 21 mg/dL (ref 6–23)
CALCIUM: 8.9 mg/dL (ref 8.4–10.5)
CHLORIDE: 110 mmol/L (ref 96–112)
CO2: 24 mmol/L (ref 19–32)
CREATININE: 0.97 mg/dL (ref 0.50–1.35)
GFR calc Af Amer: 88 mL/min — ABNORMAL LOW (ref >90)
GFR, EST NON AFRICAN AMERICAN: 76 mL/min — AB (ref >90)
Glucose, Bld: 143 mg/dL — ABNORMAL HIGH (ref 70–99)
Potassium: 3.5 mmol/L (ref 3.5–5.1)
Sodium: 138 mmol/L (ref 135–145)

## 2014-12-30 LAB — TROPONIN I: TROPONIN I: 0.07 ng/mL — AB (ref <0.031)

## 2014-12-30 LAB — PROTIME-INR
INR: 1.09 (ref 0.00–1.49)
Prothrombin Time: 14.3 seconds (ref 11.6–15.2)

## 2014-12-30 LAB — TSH: TSH: 3.032 u[IU]/mL (ref 0.350–4.500)

## 2014-12-30 LAB — MRSA PCR SCREENING: MRSA by PCR: NEGATIVE

## 2014-12-30 LAB — APTT: aPTT: 81 seconds — ABNORMAL HIGH (ref 24–37)

## 2014-12-30 MED ORDER — ASPIRIN 300 MG RE SUPP: 300.0000 mg | RECTAL | Status: DC

## 2014-12-30 MED ORDER — AMLODIPINE BESYLATE 10 MG PO TABS
10.0000 mg | ORAL_TABLET | Freq: Every day | ORAL | Status: DC
  Administered 2014-12-31 – 2015-01-02 (×3): 10 mg via ORAL
  Filled 2014-12-30: qty 2
  Filled 2014-12-30 (×2): qty 1

## 2014-12-30 MED ORDER — ASPIRIN EC 81 MG PO TBEC
81.0000 mg | DELAYED_RELEASE_TABLET | Freq: Every day | ORAL | Status: DC
  Administered 2014-12-31: 81 mg via ORAL
  Filled 2014-12-30: qty 1

## 2014-12-30 MED ORDER — ISOSORBIDE MONONITRATE ER 60 MG PO TB24
60.0000 mg | ORAL_TABLET | Freq: Every morning | ORAL | Status: DC
  Administered 2014-12-31 – 2015-01-02 (×3): 60 mg via ORAL
  Filled 2014-12-30 (×3): qty 1

## 2014-12-30 MED ORDER — TAMSULOSIN HCL 0.4 MG PO CAPS
0.4000 mg | ORAL_CAPSULE | Freq: Every day | ORAL | Status: DC
  Administered 2014-12-31 – 2015-01-02 (×3): 0.4 mg via ORAL
  Filled 2014-12-30 (×3): qty 1

## 2014-12-30 MED ORDER — SODIUM CHLORIDE 0.9 % IV SOLN: INTRAVENOUS | Status: AC

## 2014-12-30 MED ORDER — ASPIRIN 81 MG PO CHEW: 324.0000 mg | CHEWABLE_TABLET | ORAL | Status: DC

## 2014-12-30 MED ORDER — METOPROLOL TARTRATE 25 MG PO TABS
50.0000 mg | ORAL_TABLET | Freq: Two times a day (BID) | ORAL | Status: DC
  Administered 2014-12-30 – 2015-01-02 (×6): 50 mg via ORAL
  Filled 2014-12-30: qty 1
  Filled 2014-12-30 (×2): qty 2
  Filled 2014-12-30 (×4): qty 1

## 2014-12-30 MED ORDER — ONDANSETRON HCL 4 MG/2ML IJ SOLN: 4.0000 mg | Freq: Four times a day (QID) | INTRAMUSCULAR | Status: DC | PRN

## 2014-12-30 MED ORDER — ACETAMINOPHEN 325 MG PO TABS
650.0000 mg | ORAL_TABLET | ORAL | Status: DC | PRN
Start: 2014-12-30 — End: 2015-01-02

## 2014-12-30 MED ORDER — ASPIRIN EC 81 MG PO TBEC: 81.0000 mg | DELAYED_RELEASE_TABLET | Freq: Every day | ORAL | Status: DC

## 2014-12-30 MED ORDER — PANTOPRAZOLE SODIUM 40 MG PO TBEC
40.0000 mg | DELAYED_RELEASE_TABLET | Freq: Every day | ORAL | Status: DC
  Administered 2014-12-30 – 2015-01-02 (×4): 40 mg via ORAL
  Filled 2014-12-30 (×4): qty 1

## 2014-12-30 MED ORDER — NITROGLYCERIN 0.4 MG SL SUBL: 0.4000 mg | SUBLINGUAL_TABLET | SUBLINGUAL | Status: DC | PRN

## 2014-12-30 MED ORDER — HEPARIN BOLUS VIA INFUSION
2000.0000 [IU] | Freq: Once | INTRAVENOUS | Status: AC
  Administered 2014-12-30: 2000 [IU] via INTRAVENOUS

## 2014-12-30 MED ORDER — TAMSULOSIN HCL 0.4 MG PO CAPS: 0.4000 mg | ORAL_CAPSULE | Freq: Every day | ORAL | Status: DC

## 2014-12-30 MED ORDER — SIMVASTATIN 40 MG PO TABS
40.0000 mg | ORAL_TABLET | Freq: Every day | ORAL | Status: DC
  Administered 2014-12-30 – 2015-01-01 (×3): 40 mg via ORAL
  Filled 2014-12-30 (×3): qty 1
  Filled 2014-12-30: qty 2

## 2014-12-30 MED ORDER — AMLODIPINE BESYLATE 5 MG PO TABS: 10.0000 mg | ORAL_TABLET | Freq: Every day | ORAL | Status: DC

## 2014-12-30 MED ORDER — DOXAZOSIN MESYLATE 4 MG PO TABS
4.0000 mg | ORAL_TABLET | Freq: Every day | ORAL | Status: DC
  Administered 2014-12-31 – 2015-01-02 (×3): 4 mg via ORAL
  Filled 2014-12-30 (×2): qty 1
  Filled 2014-12-30: qty 2
  Filled 2014-12-30 (×4): qty 1

## 2014-12-30 MED ORDER — SODIUM CHLORIDE 0.9 % IJ SOLN: 3.0000 mL | Freq: Two times a day (BID) | INTRAMUSCULAR | Status: DC

## 2014-12-30 MED ORDER — HEPARIN (PORCINE) IN NACL 100-0.45 UNIT/ML-% IJ SOLN
1400.0000 [IU]/h | INTRAMUSCULAR | Status: DC
Start: 2014-12-30 — End: 2015-01-01
  Administered 2014-12-31: 1250 [IU]/h via INTRAVENOUS
  Filled 2014-12-30 (×3): qty 250

## 2014-12-30 MED ORDER — HEPARIN (PORCINE) IN NACL 100-0.45 UNIT/ML-% IJ SOLN
12.0000 [IU]/kg/h | INTRAMUSCULAR | Status: DC
  Administered 2014-12-30: 12 [IU]/kg/h via INTRAVENOUS
  Filled 2014-12-30: qty 250

## 2014-12-30 NOTE — ED Notes (Signed)
Pt reported to me that when EMS arrived to his home, he took 4 81mg  aspirin and 3 0.4mg  SL nitro.

## 2014-12-30 NOTE — ED Notes (Signed)
Pt took 3 NTG and 324 asa before arrival to ED

## 2014-12-30 NOTE — ED Provider Notes (Signed)
CSN: [id number]     Arrival date & time 12/30/14  1722 History   First MD Initiated Contact with Patient 12/30/14 1730     Chief Complaint  Patient presents with  . Chest Pain     HPI  Patient presents after an episode of chest pain that resolved with nitroglycerin, aspirin. About one hour prior to arrival the patient subacutely developed chest pain, anterior, pressure-like, with associated dyspnea, lightheadedness. Patient was found to have a tachycardia, but there was no EKG or rhythm strip obtained by EMS. Per report the patient was found to have heart rate in the 150 range, with elevated blood pressure. Symptoms improved almost entirely with aspirin, nitroglycerin. Currently the patient has no complaint.   Past Medical History  Diagnosis Date  . Coronary atherosclerosis of native coronary artery     Multivessel, LVEF 55%  . Chronic back pain   . Mixed hyperlipidemia   . Essential hypertension, benign   . Myocardial infarction   . Prostate cancer     Radiation therapy 1996   Past Surgical History  Procedure Laterality Date  . Coronary artery bypass graft  2004    LIMA to LAD, SVG to diagonal, SVG to circumflex, SVG to PDA   Family History  Problem Relation Age of Onset  . Hypertension    . Coronary artery disease     History  Substance Use Topics  . Smoking status: Never Smoker   . Smokeless tobacco: Former Systems developer    Types: Chew    Quit date: 08/07/2010     Comment: never chewed up over a pack/day  . Alcohol Use: No    Review of Systems  Constitutional:       Per HPI, otherwise negative  HENT:       Per HPI, otherwise negative  Respiratory:       Per HPI, otherwise negative  Cardiovascular:       Per HPI, otherwise negative  Gastrointestinal: Negative for vomiting.  Endocrine:       Negative aside from HPI  Genitourinary:       Neg aside from HPI   Musculoskeletal:       Per HPI, otherwise negative  Skin: Negative.   Neurological: Negative for  syncope.      Allergies  Review of patient's allergies indicates no known allergies.  Home Medications   Prior to Admission medications   Medication Sig Start Date End Date Taking? Authorizing Provider  amLODipine (NORVASC) 10 MG tablet Take 1 tablet (10 mg total) by mouth daily. 01/21/12  Yes Satira Sark, MD  aspirin EC 81 MG tablet Take 81 mg by mouth daily.   Yes Historical Provider, MD  doxazosin (CARDURA) 4 MG tablet Take 4 mg by mouth daily.   Yes Historical Provider, MD  ibuprofen (ADVIL,MOTRIN) 200 MG tablet Take 600 mg by mouth every 6 (six) hours as needed for headache.   Yes Historical Provider, MD  isosorbide mononitrate (IMDUR) 60 MG 24 hr tablet Take 1 tablet (60 mg total) by mouth every morning. & 30 mg (1/2) tablet in the evening Patient taking differently: Take 30-60 mg by mouth every morning. Takes a whole tablet in the morning and half a tablet in the evening. 04/30/14  Yes Satira Sark, MD  metoprolol (LOPRESSOR) 50 MG tablet Take 50 mg by mouth 2 (two) times daily. 04/29/12  Yes Delfina Redwood, MD  nitroGLYCERIN (NITROSTAT) 0.4 MG SL tablet PLACE ONE (1) TABLET UNDER TONGUE EVERY 5  MINUTES UP TO (3) DOSES AS NEEDED FOR CHEST PAIN. IF NO RELIEF AFTER THIRD DOSE, PROCEED TO ED FOR EVALUATION 02/16/13  Yes Satira Sark, MD  NON FORMULARY Inject as directed every 4 (four) months. Steroid Injection done every 4 months.   Yes Historical Provider, MD  omeprazole (PRILOSEC) 40 MG capsule Take 1 capsule (40 mg total) by mouth daily. 04/29/12  Yes Delfina Redwood, MD  Pseudoeph-Doxylamine-DM-APAP (NYQUIL PO) Take 15 mLs by mouth at bedtime as needed (Sleep).   Yes Historical Provider, MD  simvastatin (ZOCOR) 40 MG tablet Take 1 tablet (40 mg total) by mouth at bedtime. 04/30/14  Yes Satira Sark, MD  Tamsulosin HCl (FLOMAX) 0.4 MG CAPS Take 0.4 mg by mouth daily.     Yes Historical Provider, MD   BP 117/82 mmHg  Pulse 86  Temp(Src) 97.8 F (36.6 C) (Oral)   Resp 14  Ht 5\' 8"  (1.727 m)  Wt 225 lb (102.059 kg)  BMI 34.22 kg/m2  SpO2 98% Physical Exam  Constitutional: He is oriented to person, place, and time. He appears well-developed. No distress.  HENT:  Head: Normocephalic and atraumatic.  Eyes: Conjunctivae and EOM are normal.  Cardiovascular: Normal rate and regular rhythm.   Pulmonary/Chest: Effort normal. No stridor. No respiratory distress.  Abdominal: He exhibits no distension.  Musculoskeletal: He exhibits no edema.  Neurological: He is alert and oriented to person, place, and time.  Skin: Skin is warm and dry.  Psychiatric: He has a normal mood and affect.  Nursing note and vitals reviewed.   ED Course  Procedures (including critical care time) Labs Review Labs Reviewed  BASIC METABOLIC PANEL - Abnormal; Notable for the following:    Glucose, Bld 143 (*)    GFR calc non Af Amer 76 (*)    GFR calc Af Amer 88 (*)    Anion gap 4 (*)    All other components within normal limits  TROPONIN I - Abnormal; Notable for the following:    Troponin I 0.07 (*)    All other components within normal limits  CBC WITH DIFFERENTIAL/PLATELET    Imaging Review Dg Chest Portable 1 View  12/30/2014   CLINICAL DATA:  Acute chest pain.  Initial encounter.  EXAM: PORTABLE CHEST - 1 VIEW  COMPARISON:  02/24/2013 and prior chest radiographs  FINDINGS: Cardiomegaly and CABG changes again noted.  There is no evidence of focal airspace disease, pulmonary edema, suspicious pulmonary nodule/mass, pleural effusion, or pneumothorax. No acute bony abnormalities are identified.  IMPRESSION: Cardiomegaly without evidence of acute cardiopulmonary disease.   Electronically Signed   By: Margarette Canada M.D.   On: 12/30/2014 18:00    Pulse oximetry 99% room air normal  cardiac monitor 80 sinus rhythm normal  After the initial evaluation I reviewed the patient's chart, including his cardiology outpatient visit, with noted ischemic heart disease, both  nonobstructive recent lesions on discussion with his cardiologist. Patient also had recent Dopplers of his coronary arteries. No mention of prior arrhythmia like fibrillation with rapid ventricular response.     EKG Interpretation   Date/Time:  Sunday December 30 2014 17:33:15 EST Ventricular Rate:  87 PR Interval:  200 QRS Duration: 86 QT Interval:  380 QTC Calculation: 457 R Axis:   2 Text Interpretation:  Sinus rhythm Borderline repolarization abnormality  Sinus rhythm Artifact Borderline ECG Confirmed by Carmin Muskrat  MD  ([id number]) on 12/30/2014 6:52:23 PM     7:58 PM I discussed patient's case with  our cardiology team. As the patient is chest pain-free, but had elevated troponin, after an episode of pain, heparin be started for unstable angina versus transient arrhythmia.  On repeat exam the patient appears calm, states that he understands all findings.  MDM  Patient presents after an episode of chest pain that resolved prior to ED arrival, but with elevated troponin on initial labs. Patient does have known CAD, and his episode may have been transient ischemia, though transient arrhythmia remains in the differential. No evidence for infection, neurologic compromise, dissection, but with his risk factors, after discussion with cardiology the patient was started on a heparin drip, admitted for serial troponins, further evaluation, management.   CRITICAL CARE Performed by: Carmin Muskrat Total critical care time: 35 Critical care time was exclusive of separately billable procedures and treating other patients. Critical care was necessary to treat or prevent imminent or life-threatening deterioration. Critical care was time spent personally by me on the following activities: development of treatment plan with patient and/or surrogate as well as nursing, discussions with consultants, evaluation of patient's response to treatment, examination of patient, obtaining history from  patient or surrogate, ordering and performing treatments and interventions, ordering and review of laboratory studies, ordering and review of radiographic studies, pulse oximetry and re-evaluation of patient's condition.     Carmin Muskrat, MD 12/30/14 2002

## 2014-12-30 NOTE — ED Notes (Signed)
Per EMS, pt complain of chest pain that started about an hour ago. On the scene the pt's heart rate was 150 with RVR. When pt arrived to the ED pt was in NSR with a rate of 80

## 2014-12-30 NOTE — H&P (Signed)
[facility] History and Physical  Elijah Santana QJJ:[id number] DOB: 1932/04/22 DOA: 12/30/2014  Referring physician: ER PCP: Glenda Chroman., MD   Chief Complaint: Chest pain  HPI: Elijah Santana is a 81 y.o. male  This is an 81 year old man, with a history of coronary artery disease, status post CABG in 2004, who presents with burning chest pain which started at approximately 4 PM today. The discomfort lasted for approximately 45 minutes. It was associated with sweating, anterior pressure-like feeling and associated with dyspnea, and lightheadedness. He was found to be tachycardic but no ECG rhythm strip was obtained. He was found to have a heart rate in the 150 range. Symptoms improved almost entirely with the combination of aspirin and nitroglycerin. Currently the patient feels well without any symptoms. He has no nausea, vomiting, palpitations, altered mental status or dyspnea currently.   Review of Systems:  Apart from symptoms above, all systems negative.  Past Medical History  Diagnosis Date  . Coronary atherosclerosis of native coronary artery     Multivessel, LVEF 55%  . Chronic back pain   . Mixed hyperlipidemia   . Essential hypertension, benign   . Myocardial infarction   . Prostate cancer     Radiation therapy 1996   Past Surgical History  Procedure Laterality Date  . Coronary artery bypass graft  2004    LIMA to LAD, SVG to diagonal, SVG to circumflex, SVG to PDA   Social History:  reports that he has never smoked. He quit smokeless tobacco use about 4 years ago. His smokeless tobacco use included Chew. He reports that he does not drink alcohol or use illicit drugs.  No Known Allergies  Family History  Problem Relation Age of Onset  . Hypertension    . Coronary artery disease        Prior to Admission medications   Medication Sig Start Date End Date Taking? Authorizing Provider  amLODipine (NORVASC) 10 MG tablet Take 1 tablet (10 mg total) by mouth daily.  01/21/12  Yes Satira Sark, MD  aspirin EC 81 MG tablet Take 81 mg by mouth daily.   Yes Historical Provider, MD  doxazosin (CARDURA) 4 MG tablet Take 4 mg by mouth daily.   Yes Historical Provider, MD  ibuprofen (ADVIL,MOTRIN) 200 MG tablet Take 600 mg by mouth every 6 (six) hours as needed for headache.   Yes Historical Provider, MD  isosorbide mononitrate (IMDUR) 60 MG 24 hr tablet Take 1 tablet (60 mg total) by mouth every morning. & 30 mg (1/2) tablet in the evening Patient taking differently: Take 30-60 mg by mouth every morning. Takes a whole tablet in the morning and half a tablet in the evening. 04/30/14  Yes Satira Sark, MD  metoprolol (LOPRESSOR) 50 MG tablet Take 50 mg by mouth 2 (two) times daily. 04/29/12  Yes Delfina Redwood, MD  nitroGLYCERIN (NITROSTAT) 0.4 MG SL tablet PLACE ONE (1) TABLET UNDER TONGUE EVERY 5 MINUTES UP TO (3) DOSES AS NEEDED FOR CHEST PAIN. IF NO RELIEF AFTER THIRD DOSE, PROCEED TO ED FOR EVALUATION 02/16/13  Yes Satira Sark, MD  NON FORMULARY Inject as directed every 4 (four) months. Steroid Injection done every 4 months.   Yes Historical Provider, MD  omeprazole (PRILOSEC) 40 MG capsule Take 1 capsule (40 mg total) by mouth daily. 04/29/12  Yes Delfina Redwood, MD  Pseudoeph-Doxylamine-DM-APAP (NYQUIL PO) Take 15 mLs by mouth at bedtime as needed (Sleep).   Yes Historical Provider, MD  simvastatin (ZOCOR) 40 MG tablet Take 1 tablet (40 mg total) by mouth at bedtime. 04/30/14  Yes Satira Sark, MD  Tamsulosin HCl (FLOMAX) 0.4 MG CAPS Take 0.4 mg by mouth daily.     Yes Historical Provider, MD   Physical Exam: Filed Vitals:   12/30/14 1916 12/30/14 1930 12/30/14 1945 12/30/14 2000  BP: 101/73 137/66  139/75  Pulse: 81 80 77 89  Temp:      TempSrc:      Resp: 18 15 20 19   Height:      Weight:      SpO2: 98% 97% 98% 100%    Wt Readings from Last 3 Encounters:  12/30/14 102.059 kg (225 lb)  09/20/14 105.688 kg (233 lb)  02/23/14  104.509 kg (230 lb 6.4 oz)    General:  Appears calm and comfortable Eyes: PERRL, normal lids, irises & conjunctiva ENT: grossly normal hearing, lips & tongue Neck: no LAD, masses or thyromegaly Cardiovascular: RRR, no m/r/g. No LE edema. Telemetry: SR, no arrhythmias  Respiratory: CTA bilaterally, no w/r/r. Normal respiratory effort. Abdomen: soft, ntnd Skin: no rash or induration seen on limited exam Musculoskeletal: grossly normal tone BUE/BLE Psychiatric: grossly normal mood and affect, speech fluent and appropriate Neurologic: grossly non-focal.          Labs on Admission:  Basic Metabolic Panel:  Recent Labs Lab 12/30/14 1754  NA 138  K 3.5  CL 110  CO2 24  GLUCOSE 143*  BUN 21  CREATININE 0.97  CALCIUM 8.9   Liver Function Tests: No results for input(s): AST, ALT, ALKPHOS, BILITOT, PROT, ALBUMIN in the last 168 hours. No results for input(s): LIPASE, AMYLASE in the last 168 hours. No results for input(s): AMMONIA in the last 168 hours. CBC:  Recent Labs Lab 12/30/14 1754  WBC 6.9  NEUTROABS 5.1  HGB 13.2  HCT 40.4  MCV 89.6  PLT 200   Cardiac Enzymes:  Recent Labs Lab 12/30/14 1754  TROPONINI 0.07*    BNP (last 3 results) No results for input(s): BNP in the last 8760 hours.  ProBNP (last 3 results) No results for input(s): PROBNP in the last 8760 hours.  CBG: No results for input(s): GLUCAP in the last 168 hours.  Radiological Exams on Admission: Dg Chest Portable 1 View  12/30/2014   CLINICAL DATA:  Acute chest pain.  Initial encounter.  EXAM: PORTABLE CHEST - 1 VIEW  COMPARISON:  02/24/2013 and prior chest radiographs  FINDINGS: Cardiomegaly and CABG changes again noted.  There is no evidence of focal airspace disease, pulmonary edema, suspicious pulmonary nodule/mass, pleural effusion, or pneumothorax. No acute bony abnormalities are identified.  IMPRESSION: Cardiomegaly without evidence of acute cardiopulmonary disease.   Electronically  Signed   By: Margarette Canada M.D.   On: 12/30/2014 18:00    OEU:MPNTIR sinus rhythm without any acute ST-T wave changes.  Assessment/Plan   1. Chest pain. The patient's description of the chest pain is consistent with cardiac pain. Initial troponin level is slightly elevated. He will be started on intravenous heparin and we will continue with all his other medications including nitrates and beta blockers and aspirin. We will obtain serial cardiac enzymes. We will ask cardiology to see him in the morning. 2. Hypertension, stable. Further recommendations will depend on patient's hospital progress.f consultant consulted, please document name and whether formally or informally consulted  Code Status :Full code   DVT Prophylaxis: intravenous heparin.  Family communication: Discussed the plan with the patient at  the bedside.  Disposition Plan: Home when medically stable   Time spent: 60 minutes  [facility] Pager [phone]

## 2014-12-30 NOTE — Progress Notes (Addendum)
ANTICOAGULATION CONSULT NOTE - Initial Consult  Pharmacy Consult for Heparin Indication: chest pain/ACS  No Known Allergies  Patient Measurements: Height: 5\' 8"  (172.7 cm) Weight: 225 lb (102.059 kg) IBW/kg (Calculated) : 68.4 Heparin Dosing Weight: 90.5 kg  Vital Signs: Temp: 97.8 F (36.6 C) (02/14 1843) Temp Source: Oral (02/14 1843) BP: 139/75 mmHg (02/14 2000) Pulse Rate: 89 (02/14 2000)  Labs:  Recent Labs  12/30/14 1754  HGB 13.2  HCT 40.4  PLT 200  CREATININE 0.97  TROPONINI 0.07*    Estimated Creatinine Clearance: 70.4 mL/min (by C-G formula based on Cr of 0.97).   Medical History: Past Medical History  Diagnosis Date  . Coronary atherosclerosis of native coronary artery     Multivessel, LVEF 55%  . Chronic back pain   . Mixed hyperlipidemia   . Essential hypertension, benign   . Myocardial infarction   . Prostate cancer     Radiation therapy 1996   Medications:   (Not in a hospital admission)  Home meds reviewed  Assessment: Okay for Protocol, baseline coag labs pending.  Troponin elevated.  Heparin drip started in ED by admitting MD.  Hx CAD w/ CABG '04.  Goal of Therapy:  Heparin level 0.3-0.7 units/ml Monitor platelets by anticoagulation protocol: Yes   Plan:  Give 2000 units bolus x 1 Start heparin infusion at 1150 units/hr Check anti-Xa level in 6-8 hours and daily while on heparin Continue to monitor H&H and platelets  Pricilla Larsson 12/30/2014,9:15 PM  Heparin just started in ED @ 1250 units/hr.  Discussed plan to give small bolus and reduce infusion rate to 1150 units/hr with staff.  Pricilla Larsson, [facility]  12/30/2014 9:32 PM

## 2014-12-30 NOTE — ED Notes (Signed)
Just prior to transport, pt looked a little light headed and laid his head back. i asked pt about this and he denied that anything was wrong. Pt states he was just a little depressed about being in the hospital. Pt then states my back hurts and my butt hurts and I am bored.

## 2014-12-30 NOTE — ED Notes (Signed)
MD at bedside. 

## 2014-12-31 DIAGNOSIS — I214 Non-ST elevation (NSTEMI) myocardial infarction: Secondary | ICD-10-CM | POA: Diagnosis present

## 2014-12-31 DIAGNOSIS — I209 Angina pectoris, unspecified: Secondary | ICD-10-CM

## 2014-12-31 DIAGNOSIS — Z951 Presence of aortocoronary bypass graft: Secondary | ICD-10-CM

## 2014-12-31 DIAGNOSIS — K219 Gastro-esophageal reflux disease without esophagitis: Secondary | ICD-10-CM

## 2014-12-31 LAB — TROPONIN I
TROPONIN I: 0.77 ng/mL — AB (ref <0.031)
TROPONIN I: 0.9 ng/mL — AB (ref <0.031)
TROPONIN I: 0.5 ng/mL — AB (ref <0.031)
TROPONIN I: 0.43 ng/mL — AB (ref <0.031)
Troponin I: 1.39 ng/mL (ref <0.031)

## 2014-12-31 LAB — CBC
HCT: 38.3 % — ABNORMAL LOW (ref 39.0–52.0)
HEMOGLOBIN: 12.5 g/dL — AB (ref 13.0–17.0)
MCH: 29.4 pg (ref 26.0–34.0)
MCHC: 32.6 g/dL (ref 30.0–36.0)
MCV: 90.1 fL (ref 78.0–100.0)
PLATELETS: 184 10*3/uL (ref 150–400)
RBC: 4.25 MIL/uL (ref 4.22–5.81)
RDW: 13.8 % (ref 11.5–15.5)
WBC: 6.5 10*3/uL (ref 4.0–10.5)

## 2014-12-31 LAB — COMPREHENSIVE METABOLIC PANEL
ALBUMIN: 3.7 g/dL (ref 3.5–5.2)
ALK PHOS: 57 U/L (ref 39–117)
ALT: 23 U/L (ref 0–53)
ANION GAP: 8 (ref 5–15)
AST: 22 U/L (ref 0–37)
BILIRUBIN TOTAL: 0.5 mg/dL (ref 0.3–1.2)
BUN: 15 mg/dL (ref 6–23)
CO2: 23 mmol/L (ref 19–32)
Calcium: 8.9 mg/dL (ref 8.4–10.5)
Chloride: 111 mmol/L (ref 96–112)
Creatinine, Ser: 0.88 mg/dL (ref 0.50–1.35)
GFR calc non Af Amer: 79 mL/min — ABNORMAL LOW (ref >90)
Glucose, Bld: 115 mg/dL — ABNORMAL HIGH (ref 70–99)
Potassium: 3.8 mmol/L (ref 3.5–5.1)
Sodium: 142 mmol/L (ref 135–145)
TOTAL PROTEIN: 6.1 g/dL (ref 6.0–8.3)

## 2014-12-31 LAB — HEPARIN LEVEL (UNFRACTIONATED)
HEPARIN UNFRACTIONATED: 0.26 [IU]/mL — AB (ref 0.30–0.70)
HEPARIN UNFRACTIONATED: 0.27 [IU]/mL — AB (ref 0.30–0.70)

## 2014-12-31 LAB — PROTIME-INR
INR: 1.1 (ref 0.00–1.49)
Prothrombin Time: 14.4 seconds (ref 11.6–15.2)

## 2014-12-31 LAB — T4, FREE: Free T4: 1.05 ng/dL (ref 0.80–1.80)

## 2014-12-31 LAB — TSH: TSH: 3.605 u[IU]/mL (ref 0.350–4.500)

## 2014-12-31 MED ORDER — ACETAMINOPHEN 325 MG PO TABS: 650.0000 mg | ORAL_TABLET | ORAL | Status: DC | PRN

## 2014-12-31 MED ORDER — SODIUM CHLORIDE 0.9 % IJ SOLN: 3.0000 mL | INTRAMUSCULAR | Status: DC | PRN

## 2014-12-31 MED ORDER — SODIUM CHLORIDE 0.9 % IV SOLN: 250.0000 mL | INTRAVENOUS | Status: DC | PRN

## 2014-12-31 MED ORDER — ASPIRIN EC 81 MG PO TBEC
81.0000 mg | DELAYED_RELEASE_TABLET | Freq: Every day | ORAL | Status: DC
  Administered 2015-01-01: 81 mg via ORAL
  Filled 2014-12-31: qty 1

## 2014-12-31 MED ORDER — ASPIRIN 81 MG PO CHEW
81.0000 mg | CHEWABLE_TABLET | ORAL | Status: AC
  Administered 2015-01-01: 81 mg via ORAL
  Filled 2014-12-31: qty 1

## 2014-12-31 MED ORDER — ASPIRIN 81 MG PO CHEW
324.0000 mg | CHEWABLE_TABLET | ORAL | Status: AC
  Administered 2014-12-31: 324 mg via ORAL
  Filled 2014-12-31: qty 4

## 2014-12-31 MED ORDER — CLOPIDOGREL BISULFATE 75 MG PO TABS
75.0000 mg | ORAL_TABLET | Freq: Every day | ORAL | Status: DC
  Administered 2014-12-31 – 2015-01-01 (×2): 75 mg via ORAL
  Filled 2014-12-31 (×2): qty 1

## 2014-12-31 MED ORDER — NITROGLYCERIN 0.4 MG SL SUBL
0.4000 mg | SUBLINGUAL_TABLET | SUBLINGUAL | Status: DC | PRN
Start: 2014-12-31 — End: 2015-01-02

## 2014-12-31 MED ORDER — ONDANSETRON HCL 4 MG/2ML IJ SOLN
4.0000 mg | Freq: Four times a day (QID) | INTRAMUSCULAR | Status: DC | PRN
Start: 2014-12-31 — End: 2014-12-31

## 2014-12-31 MED ORDER — ASPIRIN 300 MG RE SUPP
300.0000 mg | RECTAL | Status: AC
  Filled 2014-12-31: qty 1

## 2014-12-31 MED ORDER — SODIUM CHLORIDE 0.9 % IJ SOLN
3.0000 mL | Freq: Two times a day (BID) | INTRAMUSCULAR | Status: DC
  Administered 2014-12-31: 3 mL via INTRAVENOUS

## 2014-12-31 MED ORDER — SODIUM CHLORIDE 0.9 % IV SOLN
1.0000 mL/kg/h | INTRAVENOUS | Status: DC
  Administered 2015-01-01: 1 mL/kg/h via INTRAVENOUS

## 2014-12-31 MED ORDER — SODIUM CHLORIDE 0.9 % IJ SOLN: 3.0000 mL | Freq: Two times a day (BID) | INTRAMUSCULAR | Status: DC

## 2014-12-31 NOTE — Progress Notes (Signed)
ANTICOAGULATION CONSULT NOTE - follow up  Pharmacy Consult for Heparin Indication: chest pain/ACS  No Known Allergies  Patient Measurements: Height: 5\' 8"  (172.7 cm) Weight: 232 lb 9.4 oz (105.5 kg) IBW/kg (Calculated) : 68.4 Heparin Dosing Weight: 90.5 kg  Vital Signs: Temp: 98.2 F (36.8 C) (02/15 0400) Temp Source: Oral (02/15 0400) BP: 148/76 mmHg (02/15 0600) Pulse Rate: 63 (02/15 0600)  Labs:  Recent Labs  12/30/14 1754 12/30/14 2200 12/31/14 0311 12/31/14 0503  HGB 13.2  --   --  12.5*  HCT 40.4  --   --  38.3*  PLT 200  --   --  184  APTT  --  81*  --   --   LABPROT  --  14.3  --   --   INR  --  1.09  --   --   HEPARINUNFRC  --   --   --  0.26*  CREATININE 0.97  --   --  0.88  TROPONINI 0.07* 0.77* 1.39*  --    Estimated Creatinine Clearance: 78.8 mL/min (by C-G formula based on Cr of 0.88).  Medical History: Past Medical History  Diagnosis Date  . Coronary atherosclerosis of native coronary artery     Multivessel, LVEF 55%  . Chronic back pain   . Mixed hyperlipidemia   . Essential hypertension, benign   . Myocardial infarction   . Prostate cancer     Radiation therapy 1996   Medications:  Prescriptions prior to admission  Medication Sig Dispense Refill Last Dose  . amLODipine (NORVASC) 10 MG tablet Take 1 tablet (10 mg total) by mouth daily. 90 tablet 3 12/30/2014  . aspirin EC 81 MG tablet Take 81 mg by mouth daily.   12/30/2014  . doxazosin (CARDURA) 4 MG tablet Take 4 mg by mouth daily.   12/30/2014  . ibuprofen (ADVIL,MOTRIN) 200 MG tablet Take 600 mg by mouth every 6 (six) hours as needed for headache.   12/29/2014  . isosorbide mononitrate (IMDUR) 60 MG 24 hr tablet Take 1 tablet (60 mg total) by mouth every morning. & 30 mg (1/2) tablet in the evening (Patient taking differently: Take 30-60 mg by mouth every morning. Takes a whole tablet in the morning and half a tablet in the evening.) 135 tablet 3 12/30/2014  . metoprolol (LOPRESSOR) 50 MG  tablet Take 50 mg by mouth 2 (two) times daily.   12/30/2014 at 0800  . nitroGLYCERIN (NITROSTAT) 0.4 MG SL tablet PLACE ONE (1) TABLET UNDER TONGUE EVERY 5 MINUTES UP TO (3) DOSES AS NEEDED FOR CHEST PAIN. IF NO RELIEF AFTER THIRD DOSE, PROCEED TO ED FOR EVALUATION 25 tablet 3 12/30/2014  . NON FORMULARY Inject as directed every 4 (four) months. Steroid Injection done every 4 months.   10/2014  . omeprazole (PRILOSEC) 40 MG capsule Take 1 capsule (40 mg total) by mouth daily. 30 capsule 0 12/30/2014  . Pseudoeph-Doxylamine-DM-APAP (NYQUIL PO) Take 15 mLs by mouth at bedtime as needed (Sleep).   12/29/2014  . simvastatin (ZOCOR) 40 MG tablet Take 1 tablet (40 mg total) by mouth at bedtime. 90 tablet 3 12/29/2014  . Tamsulosin HCl (FLOMAX) 0.4 MG CAPS Take 0.4 mg by mouth daily.     12/30/2014    Home meds reviewed  Assessment: Okay for Protocol, baseline coag labs pending.  Troponin elevated.  Heparin drip started in ED by admitting MD.  Hx CAD w/ CABG '04.  Initial Heparin level is below goal.  Heparin increased earlier  this am by pharmacist on call.    Goal of Therapy:  Heparin level 0.3-0.7 units/ml Monitor platelets by anticoagulation protocol: Yes   Plan:  Recheck Heparin level today at 2pm.  [place] Crane, Nicki Reaper A 12/31/2014,7:37 AM

## 2014-12-31 NOTE — Progress Notes (Signed)
ANTICOAGULATION CONSULT NOTE - follow up  Pharmacy Consult for Heparin Indication: chest pain/ACS  No Known Allergies  Patient Measurements: Height: 5\' 8"  (172.7 cm) Weight: 232 lb 9.4 oz (105.5 kg) IBW/kg (Calculated) : 68.4 Heparin Dosing Weight: 90.5 kg  Vital Signs: Temp: 98.2 F (36.8 C) (02/15 1430) Temp Source: Oral (02/15 1430) BP: 122/66 mmHg (02/15 1700) Pulse Rate: 62 (02/15 1700)  Labs:  Recent Labs  12/30/14 1754 12/30/14 2200 12/31/14 0311 12/31/14 0503 12/31/14 0845 12/31/14 1725 12/31/14 1829  HGB 13.2  --   --  12.5*  --   --   --   HCT 40.4  --   --  38.3*  --   --   --   PLT 200  --   --  184  --   --   --   APTT  --  81*  --   --   --   --   --   LABPROT  --  14.3  --   --   --  14.4  --   INR  --  1.09  --   --   --  1.10  --   HEPARINUNFRC  --   --   --  0.26*  --   --  0.27*  CREATININE 0.97  --   --  0.88  --   --   --   TROPONINI 0.07* 0.77* 1.39*  --  0.90* 0.50*  --    Estimated Creatinine Clearance: 78.8 mL/min (by C-G formula based on Cr of 0.88).  Medical History: Past Medical History  Diagnosis Date  . Coronary atherosclerosis of native coronary artery     Multivessel, LVEF 55%  . Chronic back pain   . Mixed hyperlipidemia   . Essential hypertension, benign   . Myocardial infarction   . Prostate cancer     Radiation therapy 1996   Medications:  Prescriptions prior to admission  Medication Sig Dispense Refill Last Dose  . amLODipine (NORVASC) 10 MG tablet Take 1 tablet (10 mg total) by mouth daily. 90 tablet 3 12/30/2014  . aspirin EC 81 MG tablet Take 81 mg by mouth daily.   12/30/2014  . doxazosin (CARDURA) 4 MG tablet Take 4 mg by mouth daily.   12/30/2014  . ibuprofen (ADVIL,MOTRIN) 200 MG tablet Take 600 mg by mouth every 6 (six) hours as needed for headache.   12/29/2014  . isosorbide mononitrate (IMDUR) 60 MG 24 hr tablet Take 1 tablet (60 mg total) by mouth every morning. & 30 mg (1/2) tablet in the evening (Patient  taking differently: Take 30-60 mg by mouth every morning. Takes a whole tablet in the morning and half a tablet in the evening.) 135 tablet 3 12/30/2014  . metoprolol (LOPRESSOR) 50 MG tablet Take 50 mg by mouth 2 (two) times daily.   12/30/2014 at 0800  . nitroGLYCERIN (NITROSTAT) 0.4 MG SL tablet PLACE ONE (1) TABLET UNDER TONGUE EVERY 5 MINUTES UP TO (3) DOSES AS NEEDED FOR CHEST PAIN. IF NO RELIEF AFTER THIRD DOSE, PROCEED TO ED FOR EVALUATION 25 tablet 3 12/30/2014  . NON FORMULARY Inject as directed every 4 (four) months. Steroid Injection done every 4 months.   10/2014  . omeprazole (PRILOSEC) 40 MG capsule Take 1 capsule (40 mg total) by mouth daily. 30 capsule 0 12/30/2014  . Pseudoeph-Doxylamine-DM-APAP (NYQUIL PO) Take 15 mLs by mouth at bedtime as needed (Sleep).   12/29/2014  . simvastatin (ZOCOR) 40 MG tablet  Take 1 tablet (40 mg total) by mouth at bedtime. 90 tablet 3 12/29/2014  . Tamsulosin HCl (FLOMAX) 0.4 MG CAPS Take 0.4 mg by mouth daily.     12/30/2014    Home meds reviewed  Assessment: 81yo male presenting from [facility] for further workup of chest pain.  Heparin drip started in ED by admitting MD.  Hx CAD w/ CABG '04.  Initial Heparin level is below goal.  Heparin increased earlier this am by pharmacist on call.    Follow-up HL remains SUBtherapeutic at 0.27 on heparin 1250 units/hr. No bleeding is noted.  Goal of Therapy:  Heparin level 0.3-0.7 units/ml Monitor platelets by anticoagulation protocol: Yes   Plan:  Increase heparin to 1400 units/hr Check 8h HL Daily HL/CBC Monitor s/sx of bleeding  Andrey Cota. Diona Foley, PharmD Clinical Pharmacist Pager [phone] 12/31/2014,7:08 PM

## 2014-12-31 NOTE — Progress Notes (Signed)
CRITICAL VALUE ALERT  Critical value received:  Troponin 0.5  Date of notification:  12/31/14  Time of notification:  9390  Critical value read back:Yes.    Nurse who received alert: Newman Nickels RN  Expected value. Orders in pace

## 2014-12-31 NOTE — Progress Notes (Signed)
Report called to [employer], charge nurse on [place] at [facility]. Patient transferred via [place] in stable condition.

## 2014-12-31 NOTE — Progress Notes (Signed)
Troponin level 0.77. MD notified and no new orders at this time. Continue to observe pt.

## 2014-12-31 NOTE — Progress Notes (Signed)
[facility] PROGRESS NOTE  Elijah Santana EHU:[id number] DOB: 1932/03/06 DOA: 12/30/2014 PCP: Glenda Chroman., MD  Assessment/Plan: 1. Non-ST elevation MI. Patient presented with chest pain with increase of troponin to 1.39. He was started on a heparin infusion, continued on aspirin and started on Plavix. He was seen by cardiology who felt patient would benefit from transfer to [facility] for further evaluation with cardiac catheterization. I discussed the case with Dr. Ree Kida at [facility] who has accepted the patient in transfer. Cardiology will continue to follow the patient. Continued him on Imdur. He has not had any further chest pain since coming to the hospital. 2. Hypertension. Blood pressure is currently stable. Continue beta blocker. 3. Benign prostatic hypertrophy. Continue Flomax and doxazosin. 4. Hyperlipidemia. Continue statin. 5. GERD. Continue Protonix.  Code Status: full code Family Communication: discussed with patient Disposition Plan: transfer to [facility] for further cardiac evaluation   Consultants:  Cardiology  Procedures:    Antibiotics:    HPI/Subjective: No further chest pain. Shortness of breath is better. Feeling better.  Objective: Filed Vitals:   12/31/14 0800  BP: 145/75  Pulse: 61  Temp:   Resp: 18    Intake/Output Summary (Last 24 hours) at 12/31/14 1026 Last data filed at 12/31/14 7858  Gross per 24 hour  Intake    353 ml  Output   1850 ml  Net  -1497 ml   Filed Weights   12/30/14 1726 12/30/14 2207 12/31/14 0500  Weight: 102.059 kg (225 lb) 105.5 kg (232 lb 9.4 oz) 105.5 kg (232 lb 9.4 oz)    Exam:   General:  NAD  Cardiovascular: S1, S2 RRR  Respiratory: CTA B  Abdomen: soft, nt, nd, bs+  Musculoskeletal: no edema b/l   Data Reviewed: Basic Metabolic Panel:  Recent Labs Lab 12/30/14 1754 12/31/14 0503  NA 138 142  K 3.5 3.8  CL 110 111  CO2 24 23  GLUCOSE 143* 115*  BUN 21 15  CREATININE 0.97  0.88  CALCIUM 8.9 8.9   Liver Function Tests:  Recent Labs Lab 12/31/14 0503  AST 22  ALT 23  ALKPHOS 57  BILITOT 0.5  PROT 6.1  ALBUMIN 3.7   No results for input(s): LIPASE, AMYLASE in the last 168 hours. No results for input(s): AMMONIA in the last 168 hours. CBC:  Recent Labs Lab 12/30/14 1754 12/31/14 0503  WBC 6.9 6.5  NEUTROABS 5.1  --   HGB 13.2 12.5*  HCT 40.4 38.3*  MCV 89.6 90.1  PLT 200 184   Cardiac Enzymes:  Recent Labs Lab 12/30/14 1754 12/30/14 2200 12/31/14 0311 12/31/14 0845  TROPONINI 0.07* 0.77* 1.39* 0.90*   BNP (last 3 results) No results for input(s): BNP in the last 8760 hours.  ProBNP (last 3 results) No results for input(s): PROBNP in the last 8760 hours.  CBG: No results for input(s): GLUCAP in the last 168 hours.  Recent Results (from the past 240 hour(s))  MRSA PCR Screening     Status: None   Collection Time: 12/30/14  9:39 PM  Result Value Ref Range Status   MRSA by PCR NEGATIVE NEGATIVE Final    Comment:        The GeneXpert MRSA Assay (FDA approved for NASAL specimens only), is one component of a comprehensive MRSA colonization surveillance program. It is not intended to diagnose MRSA infection nor to guide or monitor treatment for MRSA infections.      Studies: Dg Chest Portable 1 View  12/30/2014   CLINICAL DATA:  Acute chest pain.  Initial encounter.  EXAM: PORTABLE CHEST - 1 VIEW  COMPARISON:  02/24/2013 and prior chest radiographs  FINDINGS: Cardiomegaly and CABG changes again noted.  There is no evidence of focal airspace disease, pulmonary edema, suspicious pulmonary nodule/mass, pleural effusion, or pneumothorax. No acute bony abnormalities are identified.  IMPRESSION: Cardiomegaly without evidence of acute cardiopulmonary disease.   Electronically Signed   By: Margarette Canada M.D.   On: 12/30/2014 18:00    Scheduled Meds: . sodium chloride   Intravenous STAT  . amLODipine  10 mg Oral Daily  . aspirin EC   81 mg Oral Daily  . clopidogrel  75 mg Oral Daily  . doxazosin  4 mg Oral Daily  . isosorbide mononitrate  60 mg Oral q morning - 10a  . metoprolol  50 mg Oral BID  . pantoprazole  40 mg Oral Daily  . simvastatin  40 mg Oral QHS  . sodium chloride  3 mL Intravenous Q12H  . tamsulosin  0.4 mg Oral Daily   Continuous Infusions: . heparin 1,250 Units/hr (12/31/14 0641)    Principal Problem:   NSTEMI (non-ST elevated myocardial infarction) Active Problems:   Essential hypertension, benign   CORONARY ATHEROSCLEROSIS NATIVE CORONARY ARTERY   GERD (gastroesophageal reflux disease)   Chest pain   S/P CABG (coronary artery bypass graft)    Time spent: 53mins    Elijah Santana  [facility] Pager [phone]. If 7PM-7AM, please contact night-coverage at [email or web address], password [facility] 12/31/2014, 10:26 AM  LOS: 1 day

## 2014-12-31 NOTE — Progress Notes (Signed)
RN called with second  elevated troponin 0.77, patient earlier admitted with chest pain and started on IV heparin by the admitting physician. Currently patient is sleeping, no chest pain. Will continue with the current management, plan for cardiac consultation in a.m. Will continue to monitor the serial cardiac enzymes.

## 2014-12-31 NOTE — Care Management Utilization Note (Signed)
UR completed 

## 2014-12-31 NOTE — Consult Note (Signed)
CARDIOLOGY CONSULT NOTE       Patient ID: Elijah Santana MRN: [id number] DOB/AGE: 1932-03-26 81 y.o.  Admit date: 12/30/2014 Referring Physician:  Roderic Santana Primary Physician: Elijah Santana., MD Primary Cardiologist:  Elijah Santana Reason for Consultation:  Angina  Active Problems:   Essential hypertension, benign   CORONARY ATHEROSCLEROSIS NATIVE CORONARY ARTERY   Chest pain   HPI:   81 yo with known CAD.  CABG 2004 with LIMA to LAD  SVG to D1, SVG OM and SVG to PDA.  F/U cath ? 2010 with occlusion of SVG Diagonal.  Distal LAD and OM fill by collaterals.  Been Rx by  Dr Elijah Santana last few years for stable angina.  Myovue in 2012  With significant lateral wall ischemia no cath done.  Compliant with meds Admitted with 45 minutes of SSCP worse since his MI/CABG.  Diaphoretic and some nausea.  Compliant with meds  Troponin elevated 1.39  Pain free now on heparin  No positional or pleuritic component Has some chronic RLE edema from knee pain   ROS All other systems reviewed and negative except as noted above  Past Medical History  Diagnosis Date  . Coronary atherosclerosis of native coronary artery     Multivessel, LVEF 55%  . Chronic back pain   . Mixed hyperlipidemia   . Essential hypertension, benign   . Myocardial infarction   . Prostate cancer     Radiation therapy 1996    Family History  Problem Relation Age of Onset  . Hypertension    . Coronary artery disease      History   Social History  . Marital Status: Married    Spouse Name: N/A  . Number of Children: N/A  . Years of Education: N/A   Occupational History  . Retired     Office manager   Social History Main Topics  . Smoking status: Never Smoker   . Smokeless tobacco: Former Systems developer    Types: Chew    Quit date: 08/07/2010     Comment: never chewed up over a pack/day  . Alcohol Use: No  . Drug Use: No  . Sexual Activity: Not on file   Other Topics Concern  . Not on file   Social History Narrative    Past Surgical History  Procedure Laterality Date  . Coronary artery bypass graft  2004    LIMA to LAD, SVG to diagonal, SVG to circumflex, SVG to PDA     . sodium chloride   Intravenous STAT  . amLODipine  10 mg Oral Daily  . aspirin EC  81 mg Oral Daily  . clopidogrel  75 mg Oral Daily  . doxazosin  4 mg Oral Daily  . isosorbide mononitrate  60 mg Oral q morning - 10a  . metoprolol  50 mg Oral BID  . pantoprazole  40 mg Oral Daily  . simvastatin  40 mg Oral QHS  . sodium chloride  3 mL Intravenous Q12H  . tamsulosin  0.4 mg Oral Daily   . heparin 1,250 Units/hr (12/31/14 3614)    Physical Exam: Blood pressure 145/75, pulse 61, temperature 98.2 F (36.8 C), temperature source Oral, resp. rate 18, height 5\' 8"  (1.727 m), weight 105.5 kg (232 lb 9.4 oz), SpO2 96 %.    Affect appropriate Healthy:  appears stated age 41: normal Neck supple with no adenopathy JVP normal no bruits no thyromegaly Lungs clear with no wheezing and good diaphragmatic motion Heart:  S1/S2 no murmur, no  rub, gallop or click  Sternotomy  PMI normal Abdomen: benighn, BS positve, no tenderness, no AAA no bruit.  No HSM or HJR Distal pulses intact with no bruits Plus one bilateral edema Neuro non-focal Skin warm and dry No muscular weakness   Labs:   Lab Results  Component Value Date   WBC 6.5 12/31/2014   HGB 12.5* 12/31/2014   HCT 38.3* 12/31/2014   MCV 90.1 12/31/2014   PLT 184 12/31/2014    Recent Labs Lab 12/31/14 0503  NA 142  K 3.8  CL 111  CO2 23  BUN 15  CREATININE 0.88  CALCIUM 8.9  PROT 6.1  BILITOT 0.5  ALKPHOS 57  ALT 23  AST 22  GLUCOSE 115*   Lab Results  Component Value Date   CKTOTAL 127 04/29/2012   CKMB 2.2 04/29/2012   TROPONINI 0.90* 12/31/2014   No results found for: CHOL No results found for: HDL No results found for: LDLCALC No results found for: TRIG No results found for: CHOLHDL No results found for: LDLDIRECT    Radiology: Dg Chest  Portable 1 View  12/30/2014   CLINICAL DATA:  Acute chest pain.  Initial encounter.  EXAM: PORTABLE CHEST - 1 VIEW  COMPARISON:  02/24/2013 and prior chest radiographs  FINDINGS: Cardiomegaly and CABG changes again noted.  There is no evidence of focal airspace disease, pulmonary edema, suspicious pulmonary nodule/mass, pleural effusion, or pneumothorax. No acute bony abnormalities are identified.  IMPRESSION: Cardiomegaly without evidence of acute cardiopulmonary disease.   Electronically Signed   By: Elijah Santana M.D.   On: 12/30/2014 18:00    EKG:  SR rate 87 normal no acute ST changes    ASSESSMENT AND PLAN:  SEMI:  Distant CABG  Known occluded graft to Diagonal and collaterals to distal LAD and OM  Prolonged pain with positive troponin.  Transfer to [facility]  Cath likely will be  Tuesday given schedule  Cotninue ASA, heparin and plavix  Discussed with hospitalist and patient agreeable HTN:  Continue amlodipine metoprolol  Change oral nitrates to iv if more pain Chol:  On statin   Signed: Jenkins Santana 12/31/2014, 10:09 AM

## 2014-12-31 NOTE — Progress Notes (Signed)
Received a page from RN that patient's third troponin went up to 1.39, no chest pain. Called and discussed with cardiologist on call Dr. Julianne Handler at [facility] who recommended to add Plavix 75 mg to patient's current medical regimen. He also recommended to have patient seen by cardiology at [facility], and follow their recommendations. [facility].

## 2015-01-01 ENCOUNTER — Encounter (HOSPITAL_COMMUNITY)
Admission: EM | Disposition: A | Discharge status: Home / Self Care | Payer: Medicare Other | Source: Home / Self Care | Attending: Internal Medicine

## 2015-01-01 ENCOUNTER — Encounter (HOSPITAL_COMMUNITY): Payer: Self-pay | Admitting: Cardiology

## 2015-01-01 DIAGNOSIS — I25810 Atherosclerosis of coronary artery bypass graft(s) without angina pectoris: Secondary | ICD-10-CM

## 2015-01-01 DIAGNOSIS — N400 Enlarged prostate without lower urinary tract symptoms: Secondary | ICD-10-CM | POA: Diagnosis present

## 2015-01-01 DIAGNOSIS — I25110 Atherosclerotic heart disease of native coronary artery with unstable angina pectoris: Secondary | ICD-10-CM

## 2015-01-01 DIAGNOSIS — I214 Non-ST elevation (NSTEMI) myocardial infarction: Secondary | ICD-10-CM

## 2015-01-01 HISTORY — PX: CARDIAC CATHETERIZATION: SHX172

## 2015-01-01 HISTORY — DX: Non-ST elevation (NSTEMI) myocardial infarction: I21.4

## 2015-01-01 HISTORY — PX: LEFT HEART CATHETERIZATION WITH CORONARY/GRAFT ANGIOGRAM: SHX5450

## 2015-01-01 LAB — POCT ACTIVATED CLOTTING TIME: ACTIVATED CLOTTING TIME: 509 s

## 2015-01-01 LAB — COMPREHENSIVE METABOLIC PANEL
ALBUMIN: 3.5 g/dL (ref 3.5–5.2)
ALK PHOS: 58 U/L (ref 39–117)
ALT: 23 U/L (ref 0–53)
AST: 22 U/L (ref 0–37)
Anion gap: 14 (ref 5–15)
BUN: 13 mg/dL (ref 6–23)
CALCIUM: 9 mg/dL (ref 8.4–10.5)
CHLORIDE: 109 mmol/L (ref 96–112)
CO2: 19 mmol/L (ref 19–32)
Creatinine, Ser: 0.89 mg/dL (ref 0.50–1.35)
GFR calc Af Amer: >90 mL/min (ref >90)
GFR calc non Af Amer: 79 mL/min — ABNORMAL LOW (ref >90)
GLUCOSE: 108 mg/dL — AB (ref 70–99)
Potassium: 3.8 mmol/L (ref 3.5–5.1)
Sodium: 142 mmol/L (ref 135–145)
TOTAL PROTEIN: 5.8 g/dL — AB (ref 6.0–8.3)
Total Bilirubin: 0.6 mg/dL (ref 0.3–1.2)

## 2015-01-01 LAB — CBC
HEMATOCRIT: 39.6 % (ref 39.0–52.0)
Hemoglobin: 12.8 g/dL — ABNORMAL LOW (ref 13.0–17.0)
MCH: 29.1 pg (ref 26.0–34.0)
MCHC: 32.3 g/dL (ref 30.0–36.0)
MCV: 90 fL (ref 78.0–100.0)
PLATELETS: 171 10*3/uL (ref 150–400)
RBC: 4.4 MIL/uL (ref 4.22–5.81)
RDW: 14.1 % (ref 11.5–15.5)
WBC: 5.5 10*3/uL (ref 4.0–10.5)

## 2015-01-01 LAB — PROTIME-INR
INR: 1.11 (ref 0.00–1.49)
Prothrombin Time: 14.5 seconds (ref 11.6–15.2)

## 2015-01-01 LAB — LIPID PANEL
Cholesterol: 135 mg/dL (ref 0–200)
HDL: 39 mg/dL — ABNORMAL LOW (ref >39)
LDL CALC: 62 mg/dL (ref 0–99)
TRIGLYCERIDES: 168 mg/dL — AB (ref <150)
Total CHOL/HDL Ratio: 3.5 RATIO
VLDL: 34 mg/dL (ref 0–40)

## 2015-01-01 LAB — HEPARIN LEVEL (UNFRACTIONATED): Heparin Unfractionated: 0.38 IU/mL (ref 0.30–0.70)

## 2015-01-01 LAB — TROPONIN I: Troponin I: 0.36 ng/mL — ABNORMAL HIGH (ref <0.031)

## 2015-01-01 SURGERY — LEFT HEART CATHETERIZATION WITH CORONARY/GRAFT ANGIOGRAM

## 2015-01-01 MED ORDER — HEPARIN (PORCINE) IN NACL 2-0.9 UNIT/ML-% IJ SOLN
INTRAMUSCULAR | Status: AC
  Filled 2015-01-01: qty 1500

## 2015-01-01 MED ORDER — BIVALIRUDIN 250 MG IV SOLR
INTRAVENOUS | Status: AC
  Filled 2015-01-01: qty 250

## 2015-01-01 MED ORDER — FENTANYL CITRATE 0.05 MG/ML IJ SOLN
INTRAMUSCULAR | Status: AC
  Filled 2015-01-01: qty 2

## 2015-01-01 MED ORDER — MIDAZOLAM HCL 2 MG/2ML IJ SOLN
INTRAMUSCULAR | Status: AC
  Filled 2015-01-01: qty 2

## 2015-01-01 MED ORDER — CLOPIDOGREL BISULFATE 75 MG PO TABS
75.0000 mg | ORAL_TABLET | Freq: Every day | ORAL | Status: DC
  Administered 2015-01-02: 09:00:00 75 mg via ORAL
  Filled 2015-01-01: qty 1

## 2015-01-01 MED ORDER — NITROGLYCERIN 1 MG/10 ML FOR IR/CATH LAB
INTRA_ARTERIAL | Status: AC
  Filled 2015-01-01: qty 10

## 2015-01-01 MED ORDER — SODIUM CHLORIDE 0.9 % IV SOLN
1.0000 mL/kg/h | INTRAVENOUS | Status: AC
  Administered 2015-01-01: 17:00:00 1 mL/kg/h via INTRAVENOUS

## 2015-01-01 MED ORDER — ASPIRIN 81 MG PO CHEW
81.0000 mg | CHEWABLE_TABLET | Freq: Every day | ORAL | Status: DC
  Administered 2015-01-02: 09:00:00 81 mg via ORAL
  Filled 2015-01-01: qty 1

## 2015-01-01 MED ORDER — SODIUM CHLORIDE 0.9 % IV SOLN
0.2500 mg/kg/h | INTRAVENOUS | Status: DC
  Filled 2015-01-01: qty 250

## 2015-01-01 MED ORDER — HEPARIN SODIUM (PORCINE) 1000 UNIT/ML IJ SOLN
INTRAMUSCULAR | Status: AC
  Filled 2015-01-01: qty 1

## 2015-01-01 MED ORDER — LIDOCAINE HCL (PF) 1 % IJ SOLN
INTRAMUSCULAR | Status: AC
  Filled 2015-01-01: qty 30

## 2015-01-01 NOTE — CV Procedure (Signed)
    Cardiac Catheterization Procedure Note  Name: Elijah Santana MRN: [id number] DOB: 11-19-1931  Procedure: Left Heart Cath, Selective Coronary Angiography, SVG and LIMA angiography,  LV angiography, PTCA and stenting of the SVG to RCA  Indication: 81 yo WM s/p CABG presents with a NSTEMI. Known occlusion of SVG to diagonal in 2010.   Procedural Details:  The left wrist was prepped, draped, and anesthetized with 1% lidocaine. Using the modified Seldinger technique, a 6 French slender sheath was introduced into the left radial artery. 3 mg of verapamil was administered through the sheath, weight-based unfractionated heparin was administered intravenously. Standard Judkins catheters were used for selective coronary angiography and left ventriculography. Catheter exchanges were performed over an exchange length guidewire.  PROCEDURAL FINDINGS Hemodynamics: AO 114/59 mean 82 mm Hg LV 116/13 mm Hg   Coronary angiography: Coronary dominance: right  Left mainstem: Normal  Left anterior descending (LAD): The LAD is occluded at the origin.   Left circumflex (LCx): The LCx is occluded after OM2. The first OM is occluded. The second OM has mild disease at the origin up to 30%.   Right coronary artery (RCA): The RCA has a 95% stenosis followed by 100% occlusion after the conus branch.   SVG to the distal RCA is patent but has a focal 90% stenosis in the mid SVG.   SVG to diagonal is occluded  SVG to the third OM/distal LCx is widely patent. It gives collaterals to the first OM.   LIMA to the mid LAD is patent. The LAD is occluded distal to the graft and fills by left to left collaterals. The diagonals also fill by left to left collaterals.   Left ventriculography: Left ventricular systolic function is abnormal. There is mid anterior hypokinesis.  LVEF is estimated at 45%, there is no significant mitral regurgitation   PCI Note:  Following the diagnostic procedure, the decision was made to  proceed with PCI of the SVG to RCA.  Weight-based bivalirudin was given for anticoagulation. Once a therapeutic ACT was achieved, a 6 [place] RCB guide catheter was inserted.  Initially tried to pass a filter wire but unable to negotiate the proximal SVG with this. A prowater coronary guidewire was used to cross the lesion.    The lesion was then primarily stented with a 3.0 x 16 mm Promus stent up to 14 atm.   Following PCI, there was 0% residual stenosis and TIMI-3 flow. Final angiography confirmed an excellent result. The patient tolerated the procedure well. There were no immediate procedural complications. A TR band was used for radial hemostasis. The patient was transferred to the post catheterization recovery area for further monitoring.  PCI Data: Vessel - SVG to RCA/Segment - mid body Percent Stenosis (pre)  90% TIMI-flow 3 Stent 3.0 x 16 mm Promus Percent Stenosis (post) 0% TIMI-flow (post) 3  Final Conclusions:   1. Severe 3 vessel occlusive CAD 2. Patent LIMA to the LAD 3. Patent SVG to OM3/distal LCx 4. Patent SVG to RCA but with severe 90% mid body stenosis 5. Occluded SVG to diagonal 6. Mild LV dysfunction. 7. Successful stenting of the SVG to RCA with DES   Recommendations:  Continue DAPT for at least one year.   Peter Martinique, [place] 01/01/2015, 12:31 PM

## 2015-01-01 NOTE — Interval H&P Note (Signed)
History and Physical Interval Note:  01/01/2015 11:01 AM  Elijah Santana  has presented today for surgery, with the diagnosis of cp  The various methods of treatment have been discussed with the patient and family. After consideration of risks, benefits and other options for treatment, the patient has consented to  Procedure(s): LEFT HEART CATHETERIZATION WITH CORONARY/GRAFT ANGIOGRAM (N/A) as a surgical intervention .  The patient's history has been reviewed, patient examined, no change in status, stable for surgery.  I have reviewed the patient's chart and labs.  Questions were answered to the patient's satisfaction.   Cath Lab Visit (complete for each Cath Lab visit)  Clinical Evaluation Leading to the Procedure:   ACS: Yes.    Non-ACS:    Anginal Classification: CCS IV  Anti-ischemic medical therapy: Maximal Therapy (2 or more classes of medications)  Non-Invasive Test Results: No non-invasive testing performed  Prior CABG: Previous CABG        Elijah Santana [facility] 01/01/2015 11:02 AM

## 2015-01-01 NOTE — Progress Notes (Signed)
UR completed 

## 2015-01-01 NOTE — Progress Notes (Signed)
TR BAND REMOVAL  LOCATION:    left radial  DEFLATED PER PROTOCOL:    Yes.    TIME BAND OFF / DRESSING APPLIED:    1630   SITE UPON ARRIVAL:    Level 0  SITE AFTER BAND REMOVAL:    Level 0  REVERSE ALLEN'S TEST:     positive  CIRCULATION SENSATION AND MOVEMENT:    Within Normal Limits   Yes.    COMMENTS:   Tolerated procedure well

## 2015-01-01 NOTE — Progress Notes (Signed)
SUBJECTIVE:  No chest pain currently  OBJECTIVE:   Vitals:   Filed Vitals:   01/01/15 0700 01/01/15 0800 01/01/15 0900 01/01/15 1000  BP: 137/85 113/80 154/78 145/93  Pulse: 66 76 64 65  Temp:  97.9 F (36.6 C)    TempSrc:  Oral    Resp: 9 15 18 18   Height:      Weight:      SpO2: 97% 95% 97% 97%   I&O's:   Intake/Output Summary (Last 24 hours) at 01/01/15 1018 Last data filed at 01/01/15 0700  Gross per 24 hour  Intake 1000.43 ml  Output   2000 ml  Net -999.57 ml   TELEMETRY: Reviewed telemetry pt in NSR:     PHYSICAL EXAM General: Well developed, well nourished, in no acute distress Head:   Normal cephalic and atramatic  Lungs:   Clear bilaterally to auscultation. Heart:HRRR S1 S2  No JVD.   Abdomen: obese Msk:  Back normal,  Normal strength and tone for age. Extremities: No edema.   Neuro: Alert and oriented. Psych:  Normal affect, responds appropriately Skin: No rash   LABS: Basic Metabolic Panel:  Recent Labs  12/31/14 0503 01/01/15 0456  NA 142 142  K 3.8 3.8  CL 111 109  CO2 23 19  GLUCOSE 115* 108*  BUN 15 13  CREATININE 0.88 0.89  CALCIUM 8.9 9.0   Liver Function Tests:  Recent Labs  12/31/14 0503 01/01/15 0456  AST 22 22  ALT 23 23  ALKPHOS 57 58  BILITOT 0.5 0.6  PROT 6.1 5.8*  ALBUMIN 3.7 3.5   No results for input(s): LIPASE, AMYLASE in the last 72 hours. CBC:  Recent Labs  12/30/14 1754 12/31/14 0503 01/01/15 0456  WBC 6.9 6.5 5.5  NEUTROABS 5.1  --   --   HGB 13.2 12.5* 12.8*  HCT 40.4 38.3* 39.6  MCV 89.6 90.1 90.0  PLT 200 184 171   Cardiac Enzymes:  Recent Labs  12/31/14 1725 12/31/14 2142 01/01/15 0456  TROPONINI 0.50* 0.43* 0.36*   BNP: Invalid input(s): POCBNP D-Dimer: No results for input(s): DDIMER in the last 72 hours. Hemoglobin A1C: No results for input(s): HGBA1C in the last 72 hours. Fasting Lipid Panel:  Recent Labs  01/01/15 0456  CHOL 135  HDL 39*  LDLCALC 62  TRIG 168*    CHOLHDL 3.5   Thyroid Function Tests:  Recent Labs  12/31/14 1725  TSH 3.605   Anemia Panel: No results for input(s): VITAMINB12, FOLATE, FERRITIN, TIBC, IRON, RETICCTPCT in the last 72 hours. Coag Panel:   Lab Results  Component Value Date   INR 1.11 01/01/2015   INR 1.10 12/31/2014   INR 1.09 12/30/2014    RADIOLOGY: Dg Chest Portable 1 View  12/30/2014   CLINICAL DATA:  Acute chest pain.  Initial encounter.  EXAM: PORTABLE CHEST - 1 VIEW  COMPARISON:  02/24/2013 and prior chest radiographs  FINDINGS: Cardiomegaly and CABG changes again noted.  There is no evidence of focal airspace disease, pulmonary edema, suspicious pulmonary nodule/mass, pleural effusion, or pneumothorax. No acute bony abnormalities are identified.  IMPRESSION: Cardiomegaly without evidence of acute cardiopulmonary disease.   Electronically Signed   By: Margarette Canada M.D.   On: 12/30/2014 18:00      ASSESSMENT: Elijah Santana:    Unstable angina/NSTEMI:  High likelihood of significant CAD.  Planning for cath later today.  Continue IV heparin, beta blocker , statin.  Already on Plavix. All questions answered.  Jettie Booze, MD  01/01/2015  10:18 AM

## 2015-01-01 NOTE — Progress Notes (Signed)
ANTICOAGULATION CONSULT NOTE - Follow Up Consult  Pharmacy Consult for heparin Indication: chest pain/ACS  Labs:  Recent Labs  12/30/14 1754 12/30/14 2200  12/31/14 0503 12/31/14 0845 12/31/14 1725 12/31/14 1829 12/31/14 2142 01/01/15 0433 01/01/15 0456  HGB 13.2  --   --  12.5*  --   --   --   --   --  12.8*  HCT 40.4  --   --  38.3*  --   --   --   --   --  39.6  PLT 200  --   --  184  --   --   --   --   --  171  APTT  --  81*  --   --   --   --   --   --   --   --   LABPROT  --  14.3  --   --   --  14.4  --   --  14.5  --   INR  --  1.09  --   --   --  1.10  --   --  1.11  --   HEPARINUNFRC  --   --   --  0.26*  --   --  0.27*  --  0.38  --   CREATININE 0.97  --   --  0.88  --   --   --   --   --   --   TROPONINI 0.07* 0.77*  < >  --  0.90* 0.50*  --  0.43*  --   --   < > = values in this interval not displayed.   Assessment/Plan:  81yo male therapeutic on heparin after rate increase. Will continue gtt at current rate and confirm stable with additional level if cath postponed.   Wynona Neat, PharmD, BCPS  01/01/2015,5:49 AM

## 2015-01-01 NOTE — Progress Notes (Signed)
PATIENT DETAILS Name: Elijah Santana [place] Age: 81 y.o. Sex: male Date of Birth: 07/30/1932 Admit Date: 12/30/2014 Admitting Physician Doree Albee, MD WLN:LGXQ,JJHER B., MD  Brief narrative:  81 year old male with known history of coronary artery disease status post CABG presented to [facility] on 2/14 with chest pain. Further workup consistent with non-STEMI, seen by cardiology and transferred to [facility] for left heart catheterization. See below for further details  Subjective: No chest pain this morning.  Assessment/Plan: Principal Problem:   NSTEMI (non-ST elevated myocardial infarction):Patient presented with chest pain with increased to a peak of troponin to 1.39. He was started on a heparin infusion, continued on aspirin and started on Plavix. He was seen by cardiology transferred to [facility] for [place] catheterization. Remains chest pain-free this morning. Await further recommendations from cardiology.  Active Problems:   Essential hypertension, benign: Controlled, continue with amlodipine, Cardura, Imdur, and metoprolol.    Dyslipidemia: Continue statin    GERD (gastroesophageal reflux disease): Continue PPI    Benign prostatic hypertrophy: Continue Flomax and doxazosin.  Disposition: Remain inpatient  Antibiotics:  None   Anti-infectives    None     DVT Prophylaxis: On heparin gtt  Code Status: Full code  Family Communication None at bedside  Procedures:  None  CONSULTS:  cardiology  Time spent 40 minutes-which includes 50% of the time with face-to-face with patient/ family and coordinating care related to the above assessment and plan.  MEDICATIONS: Scheduled Meds: . amLODipine  10 mg Oral Daily  . aspirin EC  81 mg Oral Daily  . clopidogrel  75 mg Oral Daily  . doxazosin  4 mg Oral Daily  . isosorbide mononitrate  60 mg Oral q morning - 10a  . metoprolol  50 mg Oral BID  . pantoprazole  40 mg Oral  Daily  . simvastatin  40 mg Oral QHS  . tamsulosin  0.4 mg Oral Daily   Continuous Infusions: . sodium chloride 1 mL/kg/hr (01/01/15 0436)  . heparin 1,400 Units/hr (12/31/14 2000)   PRN Meds:.acetaminophen, nitroGLYCERIN, ondansetron (ZOFRAN) IV    PHYSICAL EXAM: Vital signs in last 24 hours: Filed Vitals:   01/01/15 0700 01/01/15 0800 01/01/15 0900 01/01/15 1000  BP: 137/85 113/80 154/78 145/93  Pulse: 66 76 64 65  Temp:  97.9 F (36.6 C)    TempSrc:  Oral    Resp: 9 15 18 18   Height:      Weight:      SpO2: 97% 95% 97% 97%    Weight change: 0.136 kg (4.8 oz) Filed Weights   12/30/14 2207 12/31/14 0500 01/01/15 0438  Weight: 105.5 kg (232 lb 9.4 oz) 105.5 kg (232 lb 9.4 oz) 102.195 kg (225 lb 4.8 oz)   Body mass index is 34.26 kg/(m^2).   Gen Exam: Awake and alert with clear speech.   Neck: Supple, No JVD.   Chest: B/L Clear.   CVS: S1 S2 Regular, no murmurs.  Abdomen: soft, BS +, non tender, non distended.  Extremities: no edema, lower extremities warm to touch. Neurologic: Non Focal.   Skin: No Rash.   Wounds: N/A.   Intake/Output from previous day:  Intake/Output Summary (Last 24 hours) at 01/01/15 1023 Last data filed at 01/01/15 0700  Gross per 24 hour  Intake 1000.43 ml  Output   2000 ml  Net -999.57 ml     LAB RESULTS: CBC  Recent Labs Lab 12/30/14 1754  12/31/14 0503 01/01/15 0456  WBC 6.9 6.5 5.5  HGB 13.2 12.5* 12.8*  HCT 40.4 38.3* 39.6  PLT 200 184 171  MCV 89.6 90.1 90.0  MCH 29.3 29.4 29.1  MCHC 32.7 32.6 32.3  RDW 13.7 13.8 14.1  LYMPHSABS 1.2  --   --   MONOABS 0.4  --   --   EOSABS 0.2  --   --   BASOSABS 0.0  --   --     Chemistries   Recent Labs Lab 12/30/14 1754 12/31/14 0503 01/01/15 0456  NA 138 142 142  K 3.5 3.8 3.8  CL 110 111 109  CO2 24 23 19   GLUCOSE 143* 115* 108*  BUN 21 15 13   CREATININE 0.97 0.88 0.89  CALCIUM 8.9 8.9 9.0    CBG: No results for input(s): GLUCAP in the last 168  hours.  GFR Estimated Creatinine Clearance: 76.7 mL/min (by C-G formula based on Cr of 0.89).  Coagulation profile  Recent Labs Lab 12/30/14 2200 12/31/14 1725 01/01/15 0433  INR 1.09 1.10 1.11    Cardiac Enzymes  Recent Labs Lab 12/31/14 1725 12/31/14 2142 01/01/15 0456  TROPONINI 0.50* 0.43* 0.36*    Invalid input(s): POCBNP No results for input(s): DDIMER in the last 72 hours. No results for input(s): HGBA1C in the last 72 hours.  Recent Labs  01/01/15 0456  CHOL 135  HDL 39*  LDLCALC 62  TRIG 168*  CHOLHDL 3.5    Recent Labs  12/31/14 1725  TSH 3.605   No results for input(s): VITAMINB12, FOLATE, FERRITIN, TIBC, IRON, RETICCTPCT in the last 72 hours. No results for input(s): LIPASE, AMYLASE in the last 72 hours.  Urine Studies No results for input(s): UHGB, CRYS in the last 72 hours.  Invalid input(s): UACOL, UAPR, USPG, UPH, UTP, UGL, UKET, UBIL, UNIT, UROB, ULEU, UEPI, UWBC, URBC, UBAC, CAST, UCOM, BILUA  MICROBIOLOGY: Recent Results (from the past 240 hour(s))  MRSA PCR Screening     Status: None   Collection Time: 12/30/14  9:39 PM  Result Value Ref Range Status   MRSA by PCR NEGATIVE NEGATIVE Final    Comment:        The GeneXpert MRSA Assay (FDA approved for NASAL specimens only), is one component of a comprehensive MRSA colonization surveillance program. It is not intended to diagnose MRSA infection nor to guide or monitor treatment for MRSA infections.     RADIOLOGY STUDIES/RESULTS: Dg Chest Portable 1 View  12/30/2014   CLINICAL DATA:  Acute chest pain.  Initial encounter.  EXAM: PORTABLE CHEST - 1 VIEW  COMPARISON:  02/24/2013 and prior chest radiographs  FINDINGS: Cardiomegaly and CABG changes again noted.  There is no evidence of focal airspace disease, pulmonary edema, suspicious pulmonary nodule/mass, pleural effusion, or pneumothorax. No acute bony abnormalities are identified.  IMPRESSION: Cardiomegaly without evidence of  acute cardiopulmonary disease.   Electronically Signed   By: Margarette Canada M.D.   On: 12/30/2014 18:00    Oren Binet, MD  [facility] Pager:[phone]  If 7PM-7AM, please contact night-coverage [email or web address] Password [email or web address] 01/01/2015, 10:23 AM   LOS: 2 days

## 2015-01-01 NOTE — H&P (View-Only) (Signed)
SUBJECTIVE:  No chest pain currently  OBJECTIVE:   Vitals:   Filed Vitals:   01/01/15 0700 01/01/15 0800 01/01/15 0900 01/01/15 1000  BP: 137/85 113/80 154/78 145/93  Pulse: 66 76 64 65  Temp:  97.9 F (36.6 C)    TempSrc:  Oral    Resp: 9 15 18 18   Height:      Weight:      SpO2: 97% 95% 97% 97%   I&O's:   Intake/Output Summary (Last 24 hours) at 01/01/15 1018 Last data filed at 01/01/15 0700  Gross per 24 hour  Intake 1000.43 ml  Output   2000 ml  Net -999.57 ml   TELEMETRY: Reviewed telemetry pt in NSR:     PHYSICAL EXAM General: Well developed, well nourished, in no acute distress Head:   Normal cephalic and atramatic  Lungs:   Clear bilaterally to auscultation. Heart:HRRR S1 S2  No JVD.   Abdomen: obese Msk:  Back normal,  Normal strength and tone for age. Extremities: No edema.   Neuro: Alert and oriented. Psych:  Normal affect, responds appropriately Skin: No rash   LABS: Basic Metabolic Panel:  Recent Labs  12/31/14 0503 01/01/15 0456  NA 142 142  K 3.8 3.8  CL 111 109  CO2 23 19  GLUCOSE 115* 108*  BUN 15 13  CREATININE 0.88 0.89  CALCIUM 8.9 9.0   Liver Function Tests:  Recent Labs  12/31/14 0503 01/01/15 0456  AST 22 22  ALT 23 23  ALKPHOS 57 58  BILITOT 0.5 0.6  PROT 6.1 5.8*  ALBUMIN 3.7 3.5   No results for input(s): LIPASE, AMYLASE in the last 72 hours. CBC:  Recent Labs  12/30/14 1754 12/31/14 0503 01/01/15 0456  WBC 6.9 6.5 5.5  NEUTROABS 5.1  --   --   HGB 13.2 12.5* 12.8*  HCT 40.4 38.3* 39.6  MCV 89.6 90.1 90.0  PLT 200 184 171   Cardiac Enzymes:  Recent Labs  12/31/14 1725 12/31/14 2142 01/01/15 0456  TROPONINI 0.50* 0.43* 0.36*   BNP: Invalid input(s): POCBNP D-Dimer: No results for input(s): DDIMER in the last 72 hours. Hemoglobin A1C: No results for input(s): HGBA1C in the last 72 hours. Fasting Lipid Panel:  Recent Labs  01/01/15 0456  CHOL 135  HDL 39*  LDLCALC 62  TRIG 168*    CHOLHDL 3.5   Thyroid Function Tests:  Recent Labs  12/31/14 1725  TSH 3.605   Anemia Panel: No results for input(s): VITAMINB12, FOLATE, FERRITIN, TIBC, IRON, RETICCTPCT in the last 72 hours. Coag Panel:   Lab Results  Component Value Date   INR 1.11 01/01/2015   INR 1.10 12/31/2014   INR 1.09 12/30/2014    RADIOLOGY: Dg Chest Portable 1 View  12/30/2014   CLINICAL DATA:  Acute chest pain.  Initial encounter.  EXAM: PORTABLE CHEST - 1 VIEW  COMPARISON:  02/24/2013 and prior chest radiographs  FINDINGS: Cardiomegaly and CABG changes again noted.  There is no evidence of focal airspace disease, pulmonary edema, suspicious pulmonary nodule/mass, pleural effusion, or pneumothorax. No acute bony abnormalities are identified.  IMPRESSION: Cardiomegaly without evidence of acute cardiopulmonary disease.   Electronically Signed   By: Margarette Canada M.D.   On: 12/30/2014 18:00      ASSESSMENT: Elijah Santana:    Unstable angina/NSTEMI:  High likelihood of significant CAD.  Planning for cath later today.  Continue IV heparin, beta blocker , statin.  Already on Plavix. All questions answered.  Jettie Booze, MD  01/01/2015  10:18 AM

## 2015-01-02 ENCOUNTER — Encounter (HOSPITAL_COMMUNITY): Payer: Self-pay | Admitting: Cardiology

## 2015-01-02 DIAGNOSIS — Z951 Presence of aortocoronary bypass graft: Secondary | ICD-10-CM

## 2015-01-02 DIAGNOSIS — Z955 Presence of coronary angioplasty implant and graft: Secondary | ICD-10-CM

## 2015-01-02 DIAGNOSIS — I25810 Atherosclerosis of coronary artery bypass graft(s) without angina pectoris: Secondary | ICD-10-CM | POA: Diagnosis present

## 2015-01-02 DIAGNOSIS — I10 Essential (primary) hypertension: Secondary | ICD-10-CM

## 2015-01-02 LAB — BASIC METABOLIC PANEL
ANION GAP: 4 — AB (ref 5–15)
BUN: 10 mg/dL (ref 6–23)
CHLORIDE: 110 mmol/L (ref 96–112)
CO2: 25 mmol/L (ref 19–32)
Calcium: 8.5 mg/dL (ref 8.4–10.5)
Creatinine, Ser: 0.96 mg/dL (ref 0.50–1.35)
GFR calc Af Amer: 88 mL/min — ABNORMAL LOW (ref >90)
GFR calc non Af Amer: 76 mL/min — ABNORMAL LOW (ref >90)
GLUCOSE: 126 mg/dL — AB (ref 70–99)
POTASSIUM: 3.5 mmol/L (ref 3.5–5.1)
SODIUM: 139 mmol/L (ref 135–145)

## 2015-01-02 LAB — CBC
HCT: 37.5 % — ABNORMAL LOW (ref 39.0–52.0)
Hemoglobin: 12.3 g/dL — ABNORMAL LOW (ref 13.0–17.0)
MCH: 29.1 pg (ref 26.0–34.0)
MCHC: 32.8 g/dL (ref 30.0–36.0)
MCV: 88.9 fL (ref 78.0–100.0)
PLATELETS: 172 10*3/uL (ref 150–400)
RBC: 4.22 MIL/uL (ref 4.22–5.81)
RDW: 14.1 % (ref 11.5–15.5)
WBC: 5.7 10*3/uL (ref 4.0–10.5)

## 2015-01-02 LAB — HEMOGLOBIN A1C
HEMOGLOBIN A1C: 6.1 % — AB (ref 4.8–5.6)
Mean Plasma Glucose: 128 mg/dL

## 2015-01-02 MED ORDER — ISOSORBIDE MONONITRATE ER 60 MG PO TB24: 60.0000 mg | ORAL_TABLET | Freq: Every morning | ORAL | Status: DC

## 2015-01-02 MED ORDER — PANTOPRAZOLE SODIUM 40 MG PO TBEC: 40.0000 mg | DELAYED_RELEASE_TABLET | Freq: Every day | ORAL | Status: DC

## 2015-01-02 MED ORDER — CLOPIDOGREL BISULFATE 75 MG PO TABS: 75.0000 mg | ORAL_TABLET | Freq: Every day | ORAL | Status: DC

## 2015-01-02 MED FILL — Sodium Chloride IV Soln 0.9%: INTRAVENOUS | Qty: 50 | Status: AC

## 2015-01-02 NOTE — Progress Notes (Signed)
TELEMETRY: Reviewed telemetry pt in NSR: Filed Vitals:   01/01/15 2220 01/02/15 0041 01/02/15 0438 01/02/15 0500  BP: 126/55 152/67 163/81   Pulse: 66 65 64   Temp:  98.3 F (36.8 C) 98.9 F (37.2 C)   TempSrc:  Oral Oral   Resp:  16 20   Height:      Weight:  222 lb 10.6 oz (101 kg)  222 lb 10.6 oz (101 kg)  SpO2: 95% 91% 94%     Intake/Output Summary (Last 24 hours) at 01/02/15 0714 Last data filed at 01/02/15 0440  Gross per 24 hour  Intake 1316.33 ml  Output   1375 ml  Net -58.67 ml   Filed Weights   01/01/15 0438 01/02/15 0041 01/02/15 0500  Weight: 225 lb 4.8 oz (102.195 kg) 222 lb 10.6 oz (101 kg) 222 lb 10.6 oz (101 kg)    Subjective Feels well. No chest pain or SOB.   Marland Kitchen amLODipine  10 mg Oral Daily  . aspirin  81 mg Oral Daily  . clopidogrel  75 mg Oral Q breakfast  . doxazosin  4 mg Oral Daily  . isosorbide mononitrate  60 mg Oral q morning - 10a  . metoprolol  50 mg Oral BID  . pantoprazole  40 mg Oral Daily  . simvastatin  40 mg Oral QHS  . tamsulosin  0.4 mg Oral Daily      LABS: Basic Metabolic Panel:  Recent Labs  01/01/15 0456 01/02/15 0359  NA 142 139  K 3.8 3.5  CL 109 110  CO2 19 25  GLUCOSE 108* 126*  BUN 13 10  CREATININE 0.89 0.96  CALCIUM 9.0 8.5   Liver Function Tests:  Recent Labs  12/31/14 0503 01/01/15 0456  AST 22 22  ALT 23 23  ALKPHOS 57 58  BILITOT 0.5 0.6  PROT 6.1 5.8*  ALBUMIN 3.7 3.5   No results for input(s): LIPASE, AMYLASE in the last 72 hours. CBC:  Recent Labs  12/30/14 1754  01/01/15 0456 01/02/15 0359  WBC 6.9  < > 5.5 5.7  NEUTROABS 5.1  --   --   --   HGB 13.2  < > 12.8* 12.3*  HCT 40.4  < > 39.6 37.5*  MCV 89.6  < > 90.0 88.9  PLT 200  < > 171 172  < > = values in this interval not displayed. Cardiac Enzymes:  Recent Labs  12/31/14 1725 12/31/14 2142 01/01/15 0456  TROPONINI 0.50* 0.43* 0.36*   BNP: No results for input(s): PROBNP in the last 72 hours. D-Dimer: No results  for input(s): DDIMER in the last 72 hours. Hemoglobin A1C:  Recent Labs  12/31/14 1725  HGBA1C 6.1*   Fasting Lipid Panel:  Recent Labs  01/01/15 0456  CHOL 135  HDL 39*  LDLCALC 62  TRIG 168*  CHOLHDL 3.5   Thyroid Function Tests:  Recent Labs  12/31/14 1725  TSH 3.605     Radiology/Studies:  No results found.   Ecg: NSR with normal Ecg.  PHYSICAL EXAM General: Well developed, well nourished, in no acute distress. Head: Normocephalic, atraumatic, sclera non-icteric, oropharynx is clear Neck: Negative for carotid bruits. JVD not elevated. No adenopathy Lungs: Clear bilaterally to auscultation without wheezes, rales, or rhonchi. Breathing is unlabored. Heart: RRR S1 S2 without murmurs, rubs, or gallops.  Abdomen: Soft, non-tender, non-distended with normoactive bowel sounds. No hepatomegaly. No rebound/guarding. No obvious abdominal masses. Msk:  Strength and tone appears normal for age.  Extremities: No clubbing, cyanosis or edema.  Distal pedal pulses are 2+ and equal bilaterally. Left radial site without hematoma. Neuro: Alert and oriented X 3. Moves all extremities spontaneously. Psych:  Responds to questions appropriately with a normal affect.  ASSESSMENT AND PLAN: 1. Unstable angina. S/p DES of SVG to RCA. Other CAD is unchanged from 2010. Continue prior antianginal therapy. Needs DAPT with ASA and Plavix for one year. OK for DC today. 2. CAD s/p CABG. Chronic occlusion of SVG to diagonal. LAD is occluded distally. OM1 is occluded. These vessels and the diagonals are supplied by collaterals. 3. Hyperlipidemia. LDL 62 at goal on Zocor 40 mg daily.  4. HTN  Present on Admission:  . Chest pain . CORONARY ATHEROSCLEROSIS NATIVE CORONARY ARTERY . Essential hypertension, benign . NSTEMI (non-ST elevated myocardial infarction) . GERD (gastroesophageal reflux disease)  Signed, Peter Martinique, [place] 01/02/2015 7:14 AM

## 2015-01-02 NOTE — Discharge Summary (Signed)
Physician Discharge Summary       Patient ID: Elijah Santana MRN: [id number] DOB/AGE: 23-May-1932 81 y.o.  Admit date: 12/30/2014 Discharge date: 01/02/2015 Primary Cardiologist: Dr. Domenic Polite   Discharge Diagnoses:  Principal Problem:   NSTEMI (non-ST elevated myocardial infarction) Active Problems:   CAD (coronary artery disease) of artery bypass graft, PCI VG-RCA DES 01/01/15   Essential hypertension, benign   CORONARY ATHEROSCLEROSIS NATIVE CORONARY ARTERY   GERD (gastroesophageal reflux disease)   Mixed hyperlipidemia   Chest pain   S/P CABG (coronary artery bypass graft)   BPH (benign prostatic hyperplasia)   Discharged Condition: good  Procedures: 01/01/15 cardiac cath by Dr. Martinique,  01/01/15 PCI to VG-RCA with Promus DES.   Hospital Course: 81 yo with known CAD. CABG 2004 with LIMA to LAD SVG to D1, SVG OM and SVG to PDA. F/U cath ? 2010 with occlusion of SVG Diagonal. Distal LAD and OM fill by collaterals. Been Rx by Dr Domenic Polite last few years for stable angina. Myoview in 2012 With significant lateral wall ischemia no cath done. Compliant with meds admitted with 45 minutes of SSCP worse since his MI/CABG. Diaphoretic and some nausea. Troponin elevated to pk of 1.39 Pain resolved on heparin No positional or pleuritic component Has some chronic RLE edema from knee pain.   Underwent cath with VG disease to RCA and stent Promus placed.  Pt tolerated the procedure.  Next AM he was seen and evaluated by Dr. Martinique and found stable for discharge. Plan DAPT for 1 year with ASA and Plavix.  Continue statin. BP is stable.  He is not diabetic but HgBA1c, is elevated, we discussed diet and need for PCP follow up.  He was told in past to monitor.      Consults: cardiology  Significant Diagnostic Studies:  BMET    Component Value Date/Time   NA 139 01/02/2015 0359   K 3.5 01/02/2015 0359   CL 110 01/02/2015 0359   CO2 25 01/02/2015 0359   GLUCOSE 126* 01/02/2015 0359   BUN 10 01/02/2015 0359   CREATININE 0.96 01/02/2015 0359   CALCIUM 8.5 01/02/2015 0359   GFRNONAA 76* 01/02/2015 0359   GFRAA 88* 01/02/2015 0359     CBC    Component Value Date/Time   WBC 5.7 01/02/2015 0359   RBC 4.22 01/02/2015 0359   HGB 12.3* 01/02/2015 0359   HCT 37.5* 01/02/2015 0359   PLT 172 01/02/2015 0359   MCV 88.9 01/02/2015 0359   MCH 29.1 01/02/2015 0359   MCHC 32.8 01/02/2015 0359   RDW 14.1 01/02/2015 0359   LYMPHSABS 1.2 12/30/2014 1754   MONOABS 0.4 12/30/2014 1754   EOSABS 0.2 12/30/2014 1754   BASOSABS 0.0 12/30/2014 1754    Pk troponin 1. 39 decreased to 0.36 at discharge. TSH  3.032  Free T4 1.05  HgBA1c 6.1   Lipid Panel     Component Value Date/Time   CHOL 135 01/01/2015 0456   TRIG 168* 01/01/2015 0456   HDL 39* 01/01/2015 0456   CHOLHDL 3.5 01/01/2015 0456   VLDL 34 01/01/2015 0456   LDLCALC 62 01/01/2015 0456     PORTABLE CHEST - 1 VIEW COMPARISON: 02/24/2013 and prior chest radiographs FINDINGS: Cardiomegaly and CABG changes again noted. There is no evidence of focal airspace disease, pulmonary edema, suspicious pulmonary nodule/mass, pleural effusion, or pneumothorax. No acute bony abnormalities are identified. IMPRESSION: Cardiomegaly without evidence of acute cardiopulmonary disease.   CARDIAC CATH: PCI Note: Following the diagnostic procedure, the  decision was made to proceed with PCI of the SVG to RCA. Weight-based bivalirudin was given for anticoagulation. Once a therapeutic ACT was achieved, a 6 [place] RCB guide catheter was inserted. Initially tried to pass a filter wire but unable to negotiate the proximal SVG with this. A prowater coronary guidewire was used to cross the lesion. The lesion was then primarily stented with a 3.0 x 16 mm Promus stent up to 14 atm. Following PCI, there was 0% residual stenosis and TIMI-3 flow. Final angiography confirmed an excellent result. The patient tolerated the procedure  well. There were no immediate procedural complications. A TR band was used for radial hemostasis. The patient was transferred to the post catheterization recovery area for further monitoring.  PCI Data: Vessel - SVG to RCA/Segment - mid body Percent Stenosis (pre) 90% TIMI-flow 3 Stent 3.0 x 16 mm Promus Percent Stenosis (post) 0% TIMI-flow (post) 3  Final Conclusions:  1. Severe 3 vessel occlusive CAD 2. Patent LIMA to the LAD 3. Patent SVG to OM3/distal LCx 4. Patent SVG to RCA but with severe 90% mid body stenosis 5. Occluded SVG to diagonal 6. Mild LV dysfunction. 7. Successful stenting of the SVG to RCA with DES  Left ventriculography: Left ventricular systolic function is abnormal. There is mid anterior hypokinesis. LVEF is estimated at 45%, there is no significant mitral regurgitation   Discharge Exam: Blood pressure 129/66, pulse 69, temperature 97.4 F (36.3 C), temperature source Oral, resp. rate 20, height 5' 8"  (1.727 m), weight 222 lb 10.6 oz (101 kg), SpO2 95 %.    Disposition: 01-Home or Self Care      Discharge Instructions    Amb Referral to Cardiac Rehabilitation    Complete by:  As directed             Medication List    STOP taking these medications        ibuprofen 200 MG tablet  Commonly known as:  ADVIL,MOTRIN     omeprazole 40 MG capsule  Commonly known as:  PRILOSEC      TAKE these medications        amLODipine 10 MG tablet  Commonly known as:  NORVASC  Take 1 tablet (10 mg total) by mouth daily.     aspirin EC 81 MG tablet  Take 81 mg by mouth daily.     clopidogrel 75 MG tablet  Commonly known as:  PLAVIX  Take 1 tablet (75 mg total) by mouth daily with breakfast.     doxazosin 4 MG tablet  Commonly known as:  CARDURA  Take 4 mg by mouth daily.     isosorbide mononitrate 60 MG 24 hr tablet  Commonly known as:  IMDUR  Take 1 tablet (60 mg total) by mouth every morning.     metoprolol 50 MG tablet  Commonly known as:   LOPRESSOR  Take 50 mg by mouth 2 (two) times daily.     nitroGLYCERIN 0.4 MG SL tablet  Commonly known as:  NITROSTAT  PLACE ONE (1) TABLET UNDER TONGUE EVERY 5 MINUTES UP TO (3) DOSES AS NEEDED FOR CHEST PAIN. IF NO RELIEF AFTER THIRD DOSE, PROCEED TO ED FOR EVALUATION     NON FORMULARY  Inject as directed every 4 (four) months. Steroid Injection done every 4 months.     NYQUIL PO  Take 15 mLs by mouth at bedtime as needed (Sleep).     pantoprazole 40 MG tablet  Commonly known as:  PROTONIX  Take 1 tablet (40 mg total) by mouth daily.     simvastatin 40 MG tablet  Commonly known as:  ZOCOR  Take 1 tablet (40 mg total) by mouth at bedtime.     tamsulosin 0.4 MG Caps capsule  Commonly known as:  FLOMAX  Take 0.4 mg by mouth daily.       Follow-up Information    Follow up with Rozann Lesches, MD.   Specialty:  Cardiology   Why:  the office will call you date and time, if you have not heard by tomorrow please call the [facility] office to receive appt.   Contact information:   [place] [id number] [phone]        Discharge Instructions: Call [facility] [phone] if any bleeding, swelling or drainage at cath site.  May shower, no tub baths for 48 hours for groin sticks. No lifting over 5 pounds for 5 days.  No Driving for 5 days.    We decreased your IMDUR to 60 mg once daily.  We changed your Prilosec to Protonix due to interaction with Plavix.   You are on PLAVIX and ASPRIN to keep your stent open, do not stop, stopping could cause a heart attack.  Your glucose (sugar) is up again,  Decrease sweets, white rice, white potatoes.  Use more whole wheat and brown rice and follow up with your Primary MD Signed: Isaiah Serge Nurse Practitioner-Certified [facility]: [facility] 01/02/2015, 8:53 AM  Time spent on discharge : >30 minutes.

## 2015-01-02 NOTE — Discharge Summary (Signed)
Physician Discharge Summary  Elijah Santana AJG:[id number] DOB: 12-11-1931 DOA: 12/30/2014  PCP: Glenda Chroman., MD  Admit date: 12/30/2014 Discharge date: 01/02/2015  Recommendations for Outpatient Follow-up:  1. Pt will need to follow up with PCP in 2 weeks post discharge 2. Follow up with   Discharge Diagnoses:  Principal Problem:   NSTEMI (non-ST elevated myocardial infarction) Active Problems:   Essential hypertension, benign   CORONARY ATHEROSCLEROSIS NATIVE CORONARY ARTERY   GERD (gastroesophageal reflux disease)   Mixed hyperlipidemia   Chest pain   S/P CABG (coronary artery bypass graft)   BPH (benign prostatic hyperplasia)   CAD (coronary artery disease) of artery bypass graft, PCI VG-RCA DES 01/01/15  NSTEMI (non-ST elevated myocardial infarction):Patient presented with chest pain with increased to a peak of troponin to 1.39. He was started on a heparin infusion, continued on aspirin and started on Plavix. He was seen by cardiology transferred to [facility] for Cardiac catheterization. Remains chest pain-free. Await further recommendations from cardiology. -01/01/2015--cardiac cath--Dr. Martinique-- VG disease to RCA and stent Promus placed. -home with ASA and Plavix Active Problems:  Essential hypertension, benign: Controlled, continue with amlodipine, Cardura, Imdur, and metoprolol.   Dyslipidemia: Continue statin   GERD (gastroesophageal reflux disease): Continue PPI   Benign prostatic hypertrophy: Continue Flomax and doxazosin.  Impaired glucose tolerance -Follow up with primary care provider -Lifestyle modification -will not start agents at this time  Discharge Condition: stable  Disposition:  Follow-up Information    Follow up with Rozann Lesches, MD.   Specialty:  Cardiology   Why:  the office will call you date and time, if you have not heard by tomorrow please call the [facility] office to receive appt.   Contact information:   [place] [address] [phone]       Diet:heart healthy Wt Readings from Last 3 Encounters:  01/02/15 101 kg (222 lb 10.6 oz)  09/20/14 105.688 kg (233 lb)  02/23/14 104.509 kg (230 lb 6.4 oz)    History of present illness:  81 yo with known CAD. CABG 2004 with LIMA to LAD SVG to D1, SVG OM and SVG to PDA. F/U cath ? 2010 with occlusion of SVG Diagonal. Distal LAD and OM fill by collaterals. Been Rx by Dr Domenic Polite last few years for stable angina. Myoview in 2012 With significant lateral wall ischemia no cath done. Compliant with meds admitted with 45 minutes of SSCP worse since his MI/CABG. Diaphoretic and some nausea. Troponin elevated to pk of 1.39 Pain resolved on heparin.  Underwent cath with VG disease to RCA and stent Promus placed. Pt tolerated the procedure. Next AM he was seen and evaluated by Dr. Martinique and found stable for discharge. Plan DAPT for 1 year with ASA and Plavix. Continue statin. BP is stable. He is not diabetic but HgBA1c, is elevated, we discussed diet and need for PCP follow up. He was told in past to monitor.     Consultants: cardiology  Discharge Exam: Filed Vitals:   01/02/15 0750  BP: 129/66  Pulse: 69  Temp: 97.4 F (36.3 C)  Resp: 20   Filed Vitals:   01/02/15 0041 01/02/15 0438 01/02/15 0500 01/02/15 0750  BP: 152/67 163/81  129/66  Pulse: 65 64  69  Temp: 98.3 F (36.8 C) 98.9 F (37.2 C)  97.4 F (36.3 C)  TempSrc: Oral Oral  Oral  Resp: 16 20  20   Height:      Weight: 101  kg (222 lb 10.6 oz)  101 kg (222 lb 10.6 oz)   SpO2: 91% 94%  95%   General: A&O x 3, NAD, pleasant, cooperative Cardiovascular: RRR, no rub, no gallop, no S3 Respiratory: CTAB, no wheeze, no rhonchi Abdomen:soft, nontender, nondistended, positive bowel sounds Extremities: trace LE edema, No lymphangitis, no petechiae  Discharge Instructions  Discharge Instructions    Amb Referral to Cardiac Rehabilitation    Complete by:  As  directed             Medication List    STOP taking these medications        ibuprofen 200 MG tablet  Commonly known as:  ADVIL,MOTRIN     omeprazole 40 MG capsule  Commonly known as:  PRILOSEC      TAKE these medications        amLODipine 10 MG tablet  Commonly known as:  NORVASC  Take 1 tablet (10 mg total) by mouth daily.     aspirin EC 81 MG tablet  Take 81 mg by mouth daily.     clopidogrel 75 MG tablet  Commonly known as:  PLAVIX  Take 1 tablet (75 mg total) by mouth daily with breakfast.     doxazosin 4 MG tablet  Commonly known as:  CARDURA  Take 4 mg by mouth daily.     isosorbide mononitrate 60 MG 24 hr tablet  Commonly known as:  IMDUR  Take 1 tablet (60 mg total) by mouth every morning.     metoprolol 50 MG tablet  Commonly known as:  LOPRESSOR  Take 50 mg by mouth 2 (two) times daily.     nitroGLYCERIN 0.4 MG SL tablet  Commonly known as:  NITROSTAT  PLACE ONE (1) TABLET UNDER TONGUE EVERY 5 MINUTES UP TO (3) DOSES AS NEEDED FOR CHEST PAIN. IF NO RELIEF AFTER THIRD DOSE, PROCEED TO ED FOR EVALUATION     NON FORMULARY  Inject as directed every 4 (four) months. Steroid Injection done every 4 months.     NYQUIL PO  Take 15 mLs by mouth at bedtime as needed (Sleep).     pantoprazole 40 MG tablet  Commonly known as:  PROTONIX  Take 1 tablet (40 mg total) by mouth daily.     simvastatin 40 MG tablet  Commonly known as:  ZOCOR  Take 1 tablet (40 mg total) by mouth at bedtime.     tamsulosin 0.4 MG Caps capsule  Commonly known as:  FLOMAX  Take 0.4 mg by mouth daily.         The results of significant diagnostics from this hospitalization (including imaging, microbiology, ancillary and laboratory) are listed below for reference.    Significant Diagnostic Studies: Dg Chest Portable 1 View  12/30/2014   CLINICAL DATA:  Acute chest pain.  Initial encounter.  EXAM: PORTABLE CHEST - 1 VIEW  COMPARISON:  02/24/2013 and prior chest radiographs   FINDINGS: Cardiomegaly and CABG changes again noted.  There is no evidence of focal airspace disease, pulmonary edema, suspicious pulmonary nodule/mass, pleural effusion, or pneumothorax. No acute bony abnormalities are identified.  IMPRESSION: Cardiomegaly without evidence of acute cardiopulmonary disease.   Electronically Signed   By: Margarette Canada M.D.   On: 12/30/2014 18:00     Microbiology: Recent Results (from the past 240 hour(s))  MRSA PCR Screening     Status: None   Collection Time: 12/30/14  9:39 PM  Result Value Ref Range Status   MRSA by PCR NEGATIVE  NEGATIVE Final    Comment:        The GeneXpert MRSA Assay (FDA approved for NASAL specimens only), is one component of a comprehensive MRSA colonization surveillance program. It is not intended to diagnose MRSA infection nor to guide or monitor treatment for MRSA infections.      Labs: Basic Metabolic Panel:  Recent Labs Lab 12/30/14 1754 12/31/14 0503 01/01/15 0456 01/02/15 0359  NA 138 142 142 139  K 3.5 3.8 3.8 3.5  CL 110 111 109 110  CO2 24 23 19 25   GLUCOSE 143* 115* 108* 126*  BUN 21 15 13 10   CREATININE 0.97 0.88 0.89 0.96  CALCIUM 8.9 8.9 9.0 8.5   Liver Function Tests:  Recent Labs Lab 12/31/14 0503 01/01/15 0456  AST 22 22  ALT 23 23  ALKPHOS 57 58  BILITOT 0.5 0.6  PROT 6.1 5.8*  ALBUMIN 3.7 3.5   No results for input(s): LIPASE, AMYLASE in the last 168 hours. No results for input(s): AMMONIA in the last 168 hours. CBC:  Recent Labs Lab 12/30/14 1754 12/31/14 0503 01/01/15 0456 01/02/15 0359  WBC 6.9 6.5 5.5 5.7  NEUTROABS 5.1  --   --   --   HGB 13.2 12.5* 12.8* 12.3*  HCT 40.4 38.3* 39.6 37.5*  MCV 89.6 90.1 90.0 88.9  PLT 200 184 171 172   Cardiac Enzymes:  Recent Labs Lab 12/31/14 0311 12/31/14 0845 12/31/14 1725 12/31/14 2142 01/01/15 0456  TROPONINI 1.39* 0.90* 0.50* 0.43* 0.36*   BNP: Invalid input(s): POCBNP CBG: No results for input(s): GLUCAP in the  last 168 hours.  Time coordinating discharge:  Greater than 30 minutes  Signed:  Candy Leverett, DO [facility] Pager: [phone] 01/02/2015, 10:27 AM

## 2015-01-02 NOTE — Progress Notes (Signed)
CARDIAC REHAB PHASE I   PRE:  Rate/Rhythm: 73 SR  BP:  Supine:   Sitting: 129/66  Standing:    SaO2:   MODE:  Ambulation: 350 ft   POST:  Rate/Rhythm: 86 SR  BP:  Supine: 167/73  Sitting:   Standing:    SaO2:  0750-0850 Pt walked 350 ft with rolling walker and asst x 1 with fairly steady gait. Stated he has walker at home for use. No CP but did c/o foot pain. Pt stated he wanted to start walking more. MI completed stressing importance of plavix with stent, NTG use and MI restrictions. Discussed CRP 2 and pt agreed to referral to [place]. Gave modified ex ed.   Graylon Good, RN BSN  01/02/2015 8:46 AM

## 2015-01-02 NOTE — Discharge Instructions (Signed)
Call [facility] [phone] if any bleeding, swelling or drainage at cath site.  May shower, no tub baths for 48 hours for groin sticks. No lifting over 5 pounds for 5 days.  No Driving for 5 days.    We decreased your IMDUR to 60 mg once daily.  We changed your Prilosec to Protonix due to interaction with Plavix.   You are on PLAVIX and ASPRIN to keep your stent open, do not stop, stopping could cause a heart attack.  Your glucose (sugar) is up again,  Decrease sweets, white rice, white potatoes.  Use more whole wheat and brown rice and follow up with your Primary MD.

## 2015-01-04 ENCOUNTER — Other Ambulatory Visit: Payer: Self-pay | Admitting: Cardiology

## 2015-01-07 ENCOUNTER — Telehealth: Payer: Self-pay | Admitting: Cardiology

## 2015-01-07 NOTE — Telephone Encounter (Signed)
Elijah Santana called in to get some clarification on the pt's plavix medication. She needed to verify that quantity and the drug interaction . Please call  Thanks   I informed Elijah Santana that a DR.Shanon Brow Tat sent this medication but she told me that she e-scribed this.

## 2015-01-07 NOTE — Telephone Encounter (Signed)
Pharmacy called seeking approval to fill Plavix r/t interaction w/ omeprazole. I noted and communicated that patient was advised to stop taking the omeprazole and take protonix instead at time of hospital discharge on 2/17.  Pharmacist acknowledged and filled rx for Plavix.

## 2015-01-15 ENCOUNTER — Encounter (HOSPITAL_COMMUNITY): Payer: Self-pay

## 2015-01-15 ENCOUNTER — Encounter (HOSPITAL_COMMUNITY)
Admission: RE | Admit: 2015-01-15 | Discharge: 2015-01-15 | Disposition: A | Discharge status: Home / Self Care | Payer: Medicare Other | Source: Ambulatory Visit | Attending: Cardiology | Admitting: Cardiology

## 2015-01-15 VITALS — BP 122/68 | HR 62 | Ht 67.0 in | Wt 230.0 lb

## 2015-01-15 DIAGNOSIS — Z955 Presence of coronary angioplasty implant and graft: Secondary | ICD-10-CM | POA: Insufficient documentation

## 2015-01-15 DIAGNOSIS — I213 ST elevation (STEMI) myocardial infarction of unspecified site: Secondary | ICD-10-CM | POA: Insufficient documentation

## 2015-01-15 NOTE — Progress Notes (Signed)
Pt has finished orientation and is scheduled to start CR on Monday, February 04, 2015 at 0930. Pt has been instructed to arrive to class 15 minutes early for scheduled class. Pt has been instructed to wear comfortable clothing and shoes with rubber soles. Pt has been told to take their medications 1 hour prior to coming to class.  If the patient is not going to attend class, he/she has been instructed to call.

## 2015-01-15 NOTE — Progress Notes (Signed)
Cardiac/Pulmonary Rehab Medication Review by a Pharmacist  Does the patient  feel that his/her medications are working for him/her?  yes  Has the patient been experiencing any side effects to the medications prescribed?  No Patient having constipation.  Takes occasional Dulcolax to aid in going to restroom.  To discuss with heart MD at appt next week.   Does the patient measure his/her own blood pressure or blood glucose at home?  no   Does the patient have any problems obtaining medications due to transportation or finances?   no  Understanding of regimen: poor Understanding of indications: poor Potential of compliance: fair  Questions asked to Determine Patient Understanding of Medication Regimen:  1. What is the name of the medication?  2. What is the medication used for?  3. When should it be taken?  4. How much should be taken?  5. How will you take it?  6. What side effects should you report?  Understanding Defined as: Excellent: All questions above are correct Good: Questions 1-4 are correct Fair: Questions 1-2 are correct  Poor: 1 or none of the above questions are correct   Elijah Santana 01/15/2015 3:08 PM

## 2015-01-15 NOTE — Progress Notes (Signed)
Patient referred to Cardiac Rehab by Dr. Domenic Polite due to s/p stent placement x 1, Z 95.5.  Dr. Domenic Polite is his cardiologist and Dr. Woody Seller is his PCP.  During orientation advised patient on arrival and appointment times what to wear, what to do before, during and after exercise.  Reviewed attendance and class policy.  Talked about inclement weather and class consultation policy. Patient is scheduled to start cardiac Rehab on Monday, February 04, 2015 at 0930.  Patient was advised to come to class 15 minutes before class starts.  He was also given instructions on meeting with the dietician and attending the Family Structure classes. Pt is eager to get started.  Pt was able to complete 6 minute walk test.

## 2015-01-18 ENCOUNTER — Encounter: Payer: Self-pay | Admitting: Cardiology

## 2015-01-18 ENCOUNTER — Other Ambulatory Visit: Payer: Self-pay | Admitting: *Deleted

## 2015-01-18 ENCOUNTER — Ambulatory Visit (INDEPENDENT_AMBULATORY_CARE_PROVIDER_SITE_OTHER): Payer: Medicare Other | Admitting: Cardiology

## 2015-01-18 VITALS — BP 102/70 | HR 72 | Ht 68.0 in | Wt 208.0 lb

## 2015-01-18 DIAGNOSIS — E782 Mixed hyperlipidemia: Secondary | ICD-10-CM

## 2015-01-18 DIAGNOSIS — I10 Essential (primary) hypertension: Secondary | ICD-10-CM

## 2015-01-18 DIAGNOSIS — I2510 Atherosclerotic heart disease of native coronary artery without angina pectoris: Secondary | ICD-10-CM

## 2015-01-18 DIAGNOSIS — I214 Non-ST elevation (NSTEMI) myocardial infarction: Secondary | ICD-10-CM

## 2015-01-18 DIAGNOSIS — I222 Subsequent non-ST elevation (NSTEMI) myocardial infarction: Secondary | ICD-10-CM

## 2015-01-18 MED ORDER — CLOPIDOGREL BISULFATE 75 MG PO TABS: 75.0000 mg | ORAL_TABLET | Freq: Every day | ORAL | Status: DC

## 2015-01-18 MED ORDER — PANTOPRAZOLE SODIUM 40 MG PO TBEC: 40.0000 mg | DELAYED_RELEASE_TABLET | Freq: Every day | ORAL | Status: DC

## 2015-01-18 NOTE — Patient Instructions (Signed)
Your physician recommends that you schedule a follow-up appointment in: 3 months. Your physician recommends that you continue on your current medications as directed. Please refer to the Current Medication list given to you today. 

## 2015-01-18 NOTE — Progress Notes (Signed)
Cardiology Office Note  Date: 01/18/2015   ID: Athen, Riel Mar 27, 1932, MRN [id number]  PCP: Glenda Chroman., MD  Primary Cardiologist: Rozann Lesches, MD   Chief Complaint  Patient presents with  . Hospitalization Follow-up  . Coronary Artery Disease  . Recent NSTEMI    History of Present Illness: Elijah Santana is an 81 y.o. male last seen in November 2015, clinically stable at that time on medical therapy. Interval records reviewed including hospitalization at [facility] in February with NSTEMI (peak troponin I 1.37). Cardiac catheterization by Dr. Martinique revealed severe native vessel CAD with patent LIMA to LAD, patent SVG to distal circumflex and third obtuse marginal, 90% mid body stenosis of SVG to RCA, occluded SVG to diagonal. Patient underwent successful DES to the SVG to RCA.  He presents today for a follow-up visit, overall doing well. He reports no recurring angina. No spontaneous bleeding problems on DAPT. We reviewed his medications, refills provided for Plavix and Protonix today.   He states that his brother is in the hospital and doing poorly after recent surgery, he has been spending a lot of time there with him. Not back to his regular walking yet but he plans to do this.   Past Medical History  Diagnosis Date  . Coronary atherosclerosis of native coronary artery     Multivessel s/p CABG, DES to SVG to RCA 12/2014  . Chronic back pain   . Mixed hyperlipidemia   . Essential hypertension, benign   . Myocardial infarction   . Prostate cancer     Radiation therapy 1996  . NSTEMI (non-ST elevated myocardial infarction) 01/01/15    Past Surgical History  Procedure Laterality Date  . Coronary artery bypass graft  2004    LIMA to LAD, SVG to diagonal, SVG to circumflex, SVG to PDA  . Left heart catheterization with coronary/graft angiogram N/A 01/01/2015    Procedure: LEFT HEART CATHETERIZATION WITH Beatrix Fetters;  Surgeon: Peter M Martinique, MD;   Location: [facility] CATH LAB;  Service: Cardiovascular;  Laterality: N/A;  . Cardiac catheterization  01/01/2015    Procedure: CORONARY STENT INTERVENTION;  Surgeon: Peter M Martinique, MD;  Location: [facility] CATH LAB;  Service: Cardiovascular;;  SVG to PDA    Current Outpatient Prescriptions  Medication Sig Dispense Refill  . amLODipine (NORVASC) 5 MG tablet Take 5 mg by mouth daily.    Marland Kitchen aspirin EC 81 MG tablet Take 81 mg by mouth daily.    . bisacodyl (DULCOLAX) 5 MG EC tablet Take 10-15 mg by mouth daily as needed for moderate constipation.    . clopidogrel (PLAVIX) 75 MG tablet Take 1 tablet (75 mg total) by mouth daily with breakfast. 30 tablet 0  . doxazosin (CARDURA) 4 MG tablet Take 2 mg by mouth daily.     . isosorbide mononitrate (IMDUR) 60 MG 24 hr tablet Take 1 tablet (60 mg total) by mouth every morning. 30 tablet 6  . metoprolol (LOPRESSOR) 50 MG tablet Take 50 mg by mouth 2 (two) times daily.    Marland Kitchen NITROSTAT 0.4 MG SL tablet PLACE ONE (1) TABLET UNDER TONGUE EVERY 5 MINUTES UP TO (3) DOSES AS NEEDED FOR CHEST PAIN. 25 tablet 3  . NON FORMULARY Inject as directed every 4 (four) months. Steroid Injection done every 4 months.    . pantoprazole (PROTONIX) 40 MG tablet Take 1 tablet (40 mg total) by mouth daily. 30 tablet 0  . Pseudoeph-Doxylamine-DM-APAP (NYQUIL PO) Take 15 mLs by  mouth at bedtime as needed (Sleep).    . simvastatin (ZOCOR) 40 MG tablet Take 1 tablet (40 mg total) by mouth at bedtime. 90 tablet 3  . tamsulosin (FLOMAX) 0.4 MG CAPS capsule Take 0.4 mg by mouth daily.     No current facility-administered medications for this visit.    Allergies:  Review of patient's allergies indicates no known allergies.   Social History: The patient  reports that he has never smoked. He quit smokeless tobacco use about 4 years ago. His smokeless tobacco use included Chew. He reports that he does not drink alcohol or use illicit drugs.   ROS:  Please see the history of present illness.  Otherwise, complete review of systems is positive for arthritic pains and stiffness.  All other systems are reviewed and negative.    Physical Exam: VS:  BP 102/70 mmHg  Pulse 72  Ht 5\' 8"  (1.727 m)  Wt 208 lb (94.348 kg)  BMI 31.63 kg/m2  SpO2 94%, BMI Body mass index is 31.63 kg/(m^2).  Wt Readings from Last 3 Encounters:  01/18/15 208 lb (94.348 kg)  01/02/15 222 lb 10.6 oz (101 kg)  09/20/14 233 lb (105.688 kg)     Overweight male in no acute distress.  HEENT: Conjunctiva and lids normal, oropharynx clear, nearly edentulous..  Neck: Supple, no elevated jugular venous pressure or bruits.  Lungs: Clear, diminished breath sounds, nonlabored.  Cardiac: Regular rate and rhythm, no S3.  Abdomen: Soft, nontender, bowel sounds present.  Extremities: No pitting edema, distal pulses 1-2+. Normal left radial. Skin: Warm and dry. [place] skeletal: No kyphosis. Neuropsychiatric: Alert and oriented x3, affect appropriate.   ECG: ECG is not ordered today.   Recent Labwork: 12/31/2014: TSH 3.605 01/01/2015: ALT 23; AST 22 01/02/2015: BUN 10; Creatinine 0.96; Hemoglobin 12.3*; Platelets 172; Potassium 3.5; Sodium 139     Component Value Date/Time   CHOL 135 01/01/2015 0456   TRIG 168* 01/01/2015 0456   HDL 39* 01/01/2015 0456   CHOLHDL 3.5 01/01/2015 0456   VLDL 34 01/01/2015 0456   LDLCALC 62 01/01/2015 0456    Assessment and Plan:  1.  Multivessel CAD status post CABG, recent NSTEMI , and DES to the SVG to RCA.  Plan to continue current medical regimen , gradually increase activity back to prior walking regimen.  2.  Hyperlipidemia, on Zocor. Recent lipids noted above with LDL 62.  3.  Essential hypertension, blood pressure is normal today.  Current medicines are reviewed at length with the patient today.  The patient does not have concerns regarding medicines.   Disposition: FU with me in 3 months.   Signed, Satira Sark, MD, [facility] 01/18/2015 1:48 PM    [place] [facility] [place], [place], [place] [address] Phone: [phone]; Fax: [phone]

## 2015-02-04 ENCOUNTER — Encounter (HOSPITAL_COMMUNITY)
Admission: RE | Admit: 2015-02-04 | Discharge: 2015-02-04 | Disposition: A | Discharge status: Home / Self Care | Payer: Medicare Other | Source: Ambulatory Visit | Attending: Cardiology | Admitting: Cardiology

## 2015-02-04 DIAGNOSIS — I213 ST elevation (STEMI) myocardial infarction of unspecified site: Secondary | ICD-10-CM | POA: Diagnosis present

## 2015-02-04 DIAGNOSIS — Z955 Presence of coronary angioplasty implant and graft: Secondary | ICD-10-CM | POA: Diagnosis not present

## 2015-02-06 ENCOUNTER — Encounter (HOSPITAL_COMMUNITY)
Admission: RE | Admit: 2015-02-06 | Discharge: 2015-02-06 | Disposition: A | Discharge status: Home / Self Care | Payer: Medicare Other | Source: Ambulatory Visit | Attending: Cardiology | Admitting: Cardiology

## 2015-02-06 DIAGNOSIS — I213 ST elevation (STEMI) myocardial infarction of unspecified site: Secondary | ICD-10-CM | POA: Diagnosis not present

## 2015-02-08 ENCOUNTER — Encounter (HOSPITAL_COMMUNITY): Payer: Medicare Other

## 2015-02-11 ENCOUNTER — Encounter (HOSPITAL_COMMUNITY): Payer: Medicare Other

## 2015-02-13 ENCOUNTER — Encounter (HOSPITAL_COMMUNITY)
Admission: RE | Admit: 2015-02-13 | Discharge: 2015-02-13 | Disposition: A | Discharge status: Home / Self Care | Payer: Medicare Other | Source: Ambulatory Visit | Attending: Cardiology | Admitting: Cardiology

## 2015-02-13 DIAGNOSIS — I213 ST elevation (STEMI) myocardial infarction of unspecified site: Secondary | ICD-10-CM | POA: Diagnosis not present

## 2015-02-15 ENCOUNTER — Encounter (HOSPITAL_COMMUNITY): Payer: Medicare Other

## 2015-02-18 ENCOUNTER — Encounter (HOSPITAL_COMMUNITY)
Admission: RE | Admit: 2015-02-18 | Discharge: 2015-02-18 | Disposition: A | Discharge status: Home / Self Care | Payer: Medicare Other | Source: Ambulatory Visit | Attending: Cardiology | Admitting: Cardiology

## 2015-02-18 DIAGNOSIS — I213 ST elevation (STEMI) myocardial infarction of unspecified site: Secondary | ICD-10-CM | POA: Insufficient documentation

## 2015-02-18 DIAGNOSIS — Z955 Presence of coronary angioplasty implant and graft: Secondary | ICD-10-CM | POA: Insufficient documentation

## 2015-02-18 NOTE — Progress Notes (Signed)
Cardiac Rehabilitation Program Outcomes Report   Orientation:  01/15/15 Graduate Date:  tbd Discharge Date:  tbd # of sessions completed: 3  Cardiologist: Dr. Domenic Polite Family MD:  Dr. Leslye Peer Time:  0930  A.  Exercise Program:  Tolerates exercise @ 3.68 METS for 30 minutes  B.  Mental Health:  Good mental attitude  C.  Education/Instruction/Skills  Accurately checks own pulse.  Rest:  53  Exercise:  79, Knows THR for exercise and Uses Perceived Exertion Scale and/or Dyspnea Scale   D.  Nutrition/Weight Control/Body Composition:  Adherence to prescribed nutrition program: good    E.  Blood Lipids    Lab Results  Component Value Date   CHOL 135 01/01/2015   HDL 39* 01/01/2015   LDLCALC 62 01/01/2015   TRIG 168* 01/01/2015   CHOLHDL 3.5 01/01/2015    F.  Lifestyle Changes:  Making positive lifestyle changes  G.  Symptoms noted with exercise:  Asymptomatic  Report Completed By:  Felicity Coyer, RN   Comments:  First week note.

## 2015-02-20 ENCOUNTER — Encounter (HOSPITAL_COMMUNITY)
Admission: RE | Admit: 2015-02-20 | Discharge: 2015-02-20 | Disposition: A | Discharge status: Home / Self Care | Payer: Medicare Other | Source: Ambulatory Visit | Attending: Cardiology | Admitting: Cardiology

## 2015-02-20 DIAGNOSIS — I213 ST elevation (STEMI) myocardial infarction of unspecified site: Secondary | ICD-10-CM | POA: Diagnosis not present

## 2015-02-22 ENCOUNTER — Encounter (HOSPITAL_COMMUNITY)
Admission: RE | Admit: 2015-02-22 | Discharge: 2015-02-22 | Disposition: A | Discharge status: Home / Self Care | Payer: Medicare Other | Source: Ambulatory Visit | Attending: Cardiology | Admitting: Cardiology

## 2015-02-22 DIAGNOSIS — I213 ST elevation (STEMI) myocardial infarction of unspecified site: Secondary | ICD-10-CM | POA: Diagnosis not present

## 2015-02-25 ENCOUNTER — Encounter (HOSPITAL_COMMUNITY): Payer: Medicare Other

## 2015-02-27 ENCOUNTER — Encounter (HOSPITAL_COMMUNITY): Payer: Medicare Other

## 2015-03-01 ENCOUNTER — Encounter (HOSPITAL_COMMUNITY)
Admission: RE | Admit: 2015-03-01 | Discharge: 2015-03-01 | Disposition: A | Discharge status: Home / Self Care | Payer: Medicare Other | Source: Ambulatory Visit | Attending: Cardiology | Admitting: Cardiology

## 2015-03-01 ENCOUNTER — Other Ambulatory Visit: Payer: Self-pay | Admitting: Urology

## 2015-03-01 DIAGNOSIS — C61 Malignant neoplasm of prostate: Secondary | ICD-10-CM

## 2015-03-01 DIAGNOSIS — I213 ST elevation (STEMI) myocardial infarction of unspecified site: Secondary | ICD-10-CM | POA: Diagnosis not present

## 2015-03-04 ENCOUNTER — Encounter (HOSPITAL_COMMUNITY)
Admission: RE | Admit: 2015-03-04 | Discharge: 2015-03-04 | Disposition: A | Discharge status: Home / Self Care | Payer: Medicare Other | Source: Ambulatory Visit | Attending: Cardiology | Admitting: Cardiology

## 2015-03-04 DIAGNOSIS — I213 ST elevation (STEMI) myocardial infarction of unspecified site: Secondary | ICD-10-CM | POA: Diagnosis not present

## 2015-03-06 ENCOUNTER — Encounter (HOSPITAL_COMMUNITY)
Admission: RE | Admit: 2015-03-06 | Discharge: 2015-03-06 | Disposition: A | Discharge status: Home / Self Care | Payer: Medicare Other | Source: Ambulatory Visit | Attending: Cardiology | Admitting: Cardiology

## 2015-03-06 DIAGNOSIS — I213 ST elevation (STEMI) myocardial infarction of unspecified site: Secondary | ICD-10-CM | POA: Diagnosis not present

## 2015-03-07 ENCOUNTER — Encounter (HOSPITAL_COMMUNITY): Payer: Self-pay

## 2015-03-07 ENCOUNTER — Ambulatory Visit (HOSPITAL_COMMUNITY)
Admission: RE | Admit: 2015-03-07 | Discharge: 2015-03-07 | Disposition: A | Discharge status: Home / Self Care | Payer: Medicare Other | Source: Ambulatory Visit | Attending: Urology | Admitting: Urology

## 2015-03-07 ENCOUNTER — Encounter (HOSPITAL_COMMUNITY)
Admission: RE | Admit: 2015-03-07 | Discharge: 2015-03-07 | Disposition: A | Discharge status: Home / Self Care | Payer: Medicare Other | Source: Ambulatory Visit | Attending: Urology | Admitting: Urology

## 2015-03-07 DIAGNOSIS — C61 Malignant neoplasm of prostate: Secondary | ICD-10-CM | POA: Insufficient documentation

## 2015-03-07 DIAGNOSIS — R972 Elevated prostate specific antigen [PSA]: Secondary | ICD-10-CM | POA: Insufficient documentation

## 2015-03-07 HISTORY — DX: Unspecified asthma, uncomplicated: J45.909

## 2015-03-07 LAB — POCT I-STAT CREATININE: Creatinine, Ser: 1 mg/dL (ref 0.50–1.35)

## 2015-03-07 MED ORDER — TECHNETIUM TC 99M MEDRONATE IV KIT
25.0000 | PACK | Freq: Once | INTRAVENOUS | Status: AC | PRN
  Administered 2015-03-07: 24 via INTRAVENOUS

## 2015-03-07 MED ORDER — IOHEXOL 300 MG/ML  SOLN
100.0000 mL | Freq: Once | INTRAMUSCULAR | Status: AC | PRN
  Administered 2015-03-07: 100 mL via INTRAVENOUS

## 2015-03-08 ENCOUNTER — Encounter (HOSPITAL_COMMUNITY)
Admission: RE | Admit: 2015-03-08 | Discharge: 2015-03-08 | Disposition: A | Discharge status: Home / Self Care | Payer: Medicare Other | Source: Ambulatory Visit | Attending: Cardiology | Admitting: Cardiology

## 2015-03-08 DIAGNOSIS — I213 ST elevation (STEMI) myocardial infarction of unspecified site: Secondary | ICD-10-CM | POA: Diagnosis not present

## 2015-03-11 ENCOUNTER — Encounter (HOSPITAL_COMMUNITY)
Admission: RE | Admit: 2015-03-11 | Discharge: 2015-03-11 | Disposition: A | Discharge status: Home / Self Care | Payer: Medicare Other | Source: Ambulatory Visit | Attending: Cardiology | Admitting: Cardiology

## 2015-03-11 DIAGNOSIS — I213 ST elevation (STEMI) myocardial infarction of unspecified site: Secondary | ICD-10-CM | POA: Diagnosis not present

## 2015-03-13 ENCOUNTER — Encounter (HOSPITAL_COMMUNITY)
Admission: RE | Admit: 2015-03-13 | Discharge: 2015-03-13 | Disposition: A | Discharge status: Home / Self Care | Payer: Medicare Other | Source: Ambulatory Visit | Attending: Cardiology | Admitting: Cardiology

## 2015-03-13 DIAGNOSIS — I213 ST elevation (STEMI) myocardial infarction of unspecified site: Secondary | ICD-10-CM | POA: Diagnosis not present

## 2015-03-15 ENCOUNTER — Encounter (HOSPITAL_COMMUNITY)
Admission: RE | Admit: 2015-03-15 | Discharge: 2015-03-15 | Disposition: A | Discharge status: Home / Self Care | Payer: Medicare Other | Source: Ambulatory Visit | Attending: Cardiology | Admitting: Cardiology

## 2015-03-15 DIAGNOSIS — I213 ST elevation (STEMI) myocardial infarction of unspecified site: Secondary | ICD-10-CM | POA: Diagnosis not present

## 2015-03-18 ENCOUNTER — Encounter (HOSPITAL_COMMUNITY)
Admission: RE | Admit: 2015-03-18 | Discharge: 2015-03-18 | Disposition: A | Discharge status: Home / Self Care | Payer: Medicare Other | Source: Ambulatory Visit | Attending: Cardiology | Admitting: Cardiology

## 2015-03-18 DIAGNOSIS — Z955 Presence of coronary angioplasty implant and graft: Secondary | ICD-10-CM | POA: Insufficient documentation

## 2015-03-18 DIAGNOSIS — I213 ST elevation (STEMI) myocardial infarction of unspecified site: Secondary | ICD-10-CM | POA: Insufficient documentation

## 2015-03-20 ENCOUNTER — Encounter (HOSPITAL_COMMUNITY)
Admission: RE | Admit: 2015-03-20 | Discharge: 2015-03-20 | Disposition: A | Discharge status: Home / Self Care | Payer: Medicare Other | Source: Ambulatory Visit | Attending: Cardiology | Admitting: Cardiology

## 2015-03-20 DIAGNOSIS — I213 ST elevation (STEMI) myocardial infarction of unspecified site: Secondary | ICD-10-CM | POA: Diagnosis not present

## 2015-03-21 ENCOUNTER — Encounter: Payer: Self-pay | Admitting: Physician Assistant

## 2015-03-21 ENCOUNTER — Ambulatory Visit (INDEPENDENT_AMBULATORY_CARE_PROVIDER_SITE_OTHER): Payer: Medicare Other | Admitting: Physician Assistant

## 2015-03-21 VITALS — BP 118/72 | HR 62 | Ht 68.0 in | Wt 226.0 lb

## 2015-03-21 DIAGNOSIS — E782 Mixed hyperlipidemia: Secondary | ICD-10-CM

## 2015-03-21 DIAGNOSIS — I10 Essential (primary) hypertension: Secondary | ICD-10-CM

## 2015-03-21 DIAGNOSIS — I2510 Atherosclerotic heart disease of native coronary artery without angina pectoris: Secondary | ICD-10-CM | POA: Diagnosis not present

## 2015-03-21 DIAGNOSIS — R079 Chest pain, unspecified: Secondary | ICD-10-CM

## 2015-03-21 DIAGNOSIS — I255 Ischemic cardiomyopathy: Secondary | ICD-10-CM | POA: Diagnosis not present

## 2015-03-21 DIAGNOSIS — I252 Old myocardial infarction: Secondary | ICD-10-CM

## 2015-03-21 DIAGNOSIS — R739 Hyperglycemia, unspecified: Secondary | ICD-10-CM | POA: Insufficient documentation

## 2015-03-21 DIAGNOSIS — Z9861 Coronary angioplasty status: Secondary | ICD-10-CM | POA: Diagnosis not present

## 2015-03-21 DIAGNOSIS — G479 Sleep disorder, unspecified: Secondary | ICD-10-CM

## 2015-03-21 NOTE — Progress Notes (Signed)
Cardiology Office Note Date:  03/21/2015  Patient ID:  Elijah Santana 10-21-1932, MRN [id number] PCP:  Glenda Chroman., MD  Cardiologist:  Dr. Rozann Lesches   Chief Complaint: chest pain  History of Present Illness: Elijah Santana is a 81 y.o. male with history of CAD (s/p CABG 2004, known occ of SVG-Diag around 2010, and NSTEMI s/p DES to [facility] 12/2014), ICM (EF 45% by cath 12/2014), HTN, HLD, hyperglycemia (A1C 6.1), remote prostate CA s/p radiation 1996, chronic back pain who presents to clinic for evaluation of chest pain. His last admission was in 12/2014 for chest pain/dyspnea with positive troponin. LDL 62 at that time. Cath showed severe 3V CAD, patent LIA-LAD, patent SVG-OM3/distal LCx, known occluded SVG-diagonal, and 90% mSVG-RCA which was treated with DES. EF was 45%. (Prev echo 2008 with mild-mod LVH, EF 60%, no segmental WMA, aortic sclerosis, mild MR/TR.) Discharged on ASA/Plavix.  He has done well since that time. He has been participating in cardiac rehab without any recent issue until yesterday. He had not eaten any breakfast yesterday. He drank coffee on an empty stomach. About 8 minutes into the treadmill at cardiac rehab he developed a dull slight substernal chest burning. He continued exercising with no change so he notified the instructor who stopped his exercise. His discomfort came down in about 10 minutes. He reports baseline BP of 110, and exercise BP of 786V systolic, which sounds appropriate for that level of exercise. EKG was not done. He was advised to f/u in our office today.  He denies any recurrent symptoms. He has not exercised since yesterday but denies any recurrent CP with ADLs or walking. He denies any associated SOB, nausea, vomiting, palpitations, diaphoresis or dizziness at time of the discomfort. It did feel somewhat like prior angina. He reports compliance with all meds. He reports difficulty sleeping due to frequent urination at night ever since his  prostate issues. No orthopnea, LEE, weight changes at home. He has no complaints today. He is not able to reproduce any CP on inspiration.  Past Medical History  Diagnosis Date  . Coronary atherosclerosis of native coronary artery     a. CABG 2004 with LIMA-LAD, SVG-D1, SVG-OM, SVG-PDA. b. Cath ~2010 with occ of SVG-diagonal, distal LAD and OM filiing by collaterals. c. NSTEMI 12/2014 s/p DES to SVG-RCA.  Marland Kitchen Chronic back pain   . Mixed hyperlipidemia   . Essential hypertension   . Myocardial infarction   . Prostate cancer     Radiation therapy 1996  . NSTEMI (non-ST elevated myocardial infarction) 01/01/15  . Asthma     Childhood  . Hyperglycemia   . Ischemic cardiomyopathy     a. EF 45% by cath 12/2014.    Past Surgical History  Procedure Laterality Date  . Coronary artery bypass graft  2004    LIMA to LAD, SVG to diagonal, SVG to circumflex, SVG to PDA  . Left heart catheterization with coronary/graft angiogram N/A 01/01/2015    Procedure: LEFT HEART CATHETERIZATION WITH Beatrix Fetters;  Surgeon: Peter M Martinique, MD;  Location: [facility] CATH LAB;  Service: Cardiovascular;  Laterality: N/A;  . Cardiac catheterization  01/01/2015    Procedure: CORONARY STENT INTERVENTION;  Surgeon: Peter M Martinique, MD;  Location: [facility] CATH LAB;  Service: Cardiovascular;;  SVG to PDA    Current Outpatient Prescriptions  Medication Sig Dispense Refill  . amLODipine (NORVASC) 5 MG tablet Take 5 mg by mouth daily.    Marland Kitchen aspirin EC 81 MG tablet  Take 81 mg by mouth daily.    . bisacodyl (DULCOLAX) 5 MG EC tablet Take 10-15 mg by mouth daily as needed for moderate constipation.    . clopidogrel (PLAVIX) 75 MG tablet Take 1 tablet (75 mg total) by mouth daily with breakfast. 90 tablet 0  . doxazosin (CARDURA) 4 MG tablet Take 2 mg by mouth daily.     . isosorbide mononitrate (IMDUR) 60 MG 24 hr tablet Take 1 tablet (60 mg total) by mouth every morning. 30 tablet 6  . metoprolol (LOPRESSOR) 50 MG tablet Take 50  mg by mouth 2 (two) times daily.    Marland Kitchen NITROSTAT 0.4 MG SL tablet PLACE ONE (1) TABLET UNDER TONGUE EVERY 5 MINUTES UP TO (3) DOSES AS NEEDED FOR CHEST PAIN. 25 tablet 3  . pantoprazole (PROTONIX) 40 MG tablet Take 1 tablet (40 mg total) by mouth daily. 90 tablet 0  . Pseudoeph-Doxylamine-DM-APAP (NYQUIL PO) Take 15 mLs by mouth at bedtime as needed (Sleep).    . simvastatin (ZOCOR) 40 MG tablet Take 1 tablet (40 mg total) by mouth at bedtime. 90 tablet 3  . tamsulosin (FLOMAX) 0.4 MG CAPS capsule Take 0.4 mg by mouth daily.     No current facility-administered medications for this visit.    Allergies:   Review of patient's allergies indicates no known allergies.   Social History:  The patient  reports that he has never smoked. He quit smokeless tobacco use about 4 years ago. His smokeless tobacco use included Chew. He reports that he does not drink alcohol or use illicit drugs.   Family History:  The patient's family history includes Coronary artery disease in an other family member; Hypertension in an other family member.  ROS:  Please see the history of present illness. No cough, fever, chills, or abdominal pain. All other systems are reviewed and otherwise negative.   PHYSICAL EXAM:  VS:  BP initial 100/60, recheck manually 118/72 mmHg  Pulse 62  Ht 5\' 8"  (1.727 m)  Wt 226 lb (102.513 kg)  BMI 34.37 kg/m2  SpO2 97% BMI: Body mass index is 34.37 kg/(m^2). Well nourished, well developed WM, in no acute distress HEENT: normocephalic, atraumatic Neck: no JVD, carotid bruits or masses Cardiac:  normal S1, S2; RRR; no murmurs, rubs, or gallops Lungs:  clear to auscultation bilaterally, no wheezing, rhonchi or rales Abd: soft, nontender, no hepatomegaly, + BS MS: no deformity or atrophy Ext: no edema Skin: warm and dry, no rash Neuro:  moves all extremities spontaneously, no focal abnormalities noted, follows commands Psych: euthymic mood, full affect  EKG:  Done today shows NSR  62bpm, low voltage in precordial leads, TWI avL, no acute change from prior  Recent Labs: 12/31/2014: TSH 3.605 01/01/2015: ALT 23 01/02/2015: BUN 10; Hemoglobin 12.3*; Platelets 172; Potassium 3.5; Sodium 139 03/07/2015: Creatinine 1.00  01/01/2015: Cholesterol, Total 135; HDL-C 39*; LDL (calc) 62; Total CHOL/HDL Ratio 3.5; Triglycerides 168*; VLDL 34   Estimated Creatinine Clearance: 68.3 mL/min (by C-G formula based on Cr of 1).   Wt Readings from Last 3 Encounters:  03/21/15 226 lb (102.513 kg)  01/18/15 208 lb (94.348 kg)  01/02/15 222 lb 10.6 oz (101 kg)  208 is likely an outlier as all other weights generally 227-230    Other studies reviewed: Additional studies/records reviewed today include: summarized above  ASSESSMENT AND PLAN:  1. Chest pain - difficult to know if this was angina based on isolated episode. EKG is unchanged, arguing against an acute  stent issue. Recent cath otherwise showed no other clear culprits. I discussed with his primary cardiologist. At this time we have asked him to return to cardiac rehab tomorrow and surveil closely for recurrent symptoms. If he has recurrent angina will need to consider relook cath versus functional study to see if we can localize possible area of ischemia. He is not tachycardic, tachypneic, hypoxic, or SOB so unlikely to represent PE. This could represent an episode of acid reflux given that he drank coffee on an empty stomach. Continue current medication regimen which includes ASA, BB, statin, Plavix, amlodipine and Imdur. He has PRN NTG if he needs it. Warning signs reviewed.  2. CAD s/p prior CABG, PCI described above - see above. 3. Essential HTN - BP controlled. I personally rechecked it to verify and it was 118/72. 4. Mixed hyperlipidemia - he is on Zocor 40mg  while on amlodipine which is more than the usual recommended dose. D/w Dr. Domenic Polite and given that he has been stable on ths regimen with normal LFTs and controlled LDL, this  will be followed by Dr. Domenic Polite. 5. Difficulty sleeping - He had Nyquil on his medicine list with the formulation that included pseudoephedrine. I educated him on staying away from that component of cold medicines due to his heart disease. He only takes this rarely, mostly for the doxylamine component in it.  I asked him to discuss with his PCP. May need advancement of his prostate regimen as this sounds primarily related to nocturia.  Disposition: F/u with Dr. Domenic Polite as scheduled in several weeks, earlier if needed.  Current medicines are reviewed at length with the patient today.  The patient did not have any concerns regarding medicines.  Signed, Melina Copa PA-C 03/21/2015 1:59 PM     [place] Location [address] [place], [place] [address] [phone]

## 2015-03-21 NOTE — Patient Instructions (Addendum)
Your physician recommends that you schedule a follow-up appointment in: as scheduled in [place]     Stop Nyquill any product with pseudoephedine (sudafed)   Continue all other medications     Thank you for choosing [facility] !

## 2015-03-22 ENCOUNTER — Encounter (HOSPITAL_COMMUNITY)
Admission: RE | Admit: 2015-03-22 | Discharge: 2015-03-22 | Disposition: A | Discharge status: Home / Self Care | Payer: Medicare Other | Source: Ambulatory Visit | Attending: Cardiology | Admitting: Cardiology

## 2015-03-22 DIAGNOSIS — I213 ST elevation (STEMI) myocardial infarction of unspecified site: Secondary | ICD-10-CM | POA: Diagnosis not present

## 2015-03-25 ENCOUNTER — Encounter (HOSPITAL_COMMUNITY)
Admission: RE | Admit: 2015-03-25 | Discharge: 2015-03-25 | Disposition: A | Discharge status: Home / Self Care | Payer: Medicare Other | Source: Ambulatory Visit | Attending: Cardiology | Admitting: Cardiology

## 2015-03-25 DIAGNOSIS — I213 ST elevation (STEMI) myocardial infarction of unspecified site: Secondary | ICD-10-CM | POA: Diagnosis not present

## 2015-03-27 ENCOUNTER — Encounter (HOSPITAL_COMMUNITY)
Admission: RE | Admit: 2015-03-27 | Discharge: 2015-03-27 | Disposition: A | Discharge status: Home / Self Care | Payer: Medicare Other | Source: Ambulatory Visit | Attending: Cardiology | Admitting: Cardiology

## 2015-03-27 DIAGNOSIS — I213 ST elevation (STEMI) myocardial infarction of unspecified site: Secondary | ICD-10-CM | POA: Diagnosis not present

## 2015-03-27 NOTE — Progress Notes (Signed)
Cardiac Rehabilitation Program Outcomes Report   Orientation:  02/04/15 Graduate Date:  tbd Discharge Date:  tbd # of sessions completed: 18  Cardiologist: Grant Ruts MD:  Leslye Peer Time:  0930  A.  Exercise Program:  Tolerates exercise @ 3.68 METS for 15 minutes  B.  Mental Health:  Good mental attitude  C.  Education/Instruction/Skills  Accurately checks own pulse.  Rest:  65  Exercise:  80 and Knows THR for exercise  Uses Perceived Exertion Scale and/or Dyspnea Scale  D.  Nutrition/Weight Control/Body Composition:  Adherence to prescribed nutrition program: good    E.  Blood Lipids    Lab Results  Component Value Date   CHOL 135 01/01/2015   HDL 39* 01/01/2015   LDLCALC 62 01/01/2015   TRIG 168* 01/01/2015   CHOLHDL 3.5 01/01/2015    F.  Lifestyle Changes:  Making positive lifestyle changes  G.  Symptoms noted with exercise:  Asymptomatic  Report Completed By:  Stevphen Rochester RN   Comments:  This is the patients halfway progress note for [facility].

## 2015-03-29 ENCOUNTER — Encounter (HOSPITAL_COMMUNITY)
Admission: RE | Admit: 2015-03-29 | Discharge: 2015-03-29 | Disposition: A | Discharge status: Home / Self Care | Payer: Medicare Other | Source: Ambulatory Visit | Attending: Cardiology | Admitting: Cardiology

## 2015-03-29 DIAGNOSIS — I213 ST elevation (STEMI) myocardial infarction of unspecified site: Secondary | ICD-10-CM | POA: Diagnosis not present

## 2015-04-01 ENCOUNTER — Encounter (HOSPITAL_COMMUNITY)
Admission: RE | Admit: 2015-04-01 | Discharge: 2015-04-01 | Disposition: A | Discharge status: Home / Self Care | Payer: Medicare Other | Source: Ambulatory Visit | Attending: Cardiology | Admitting: Cardiology

## 2015-04-01 DIAGNOSIS — I213 ST elevation (STEMI) myocardial infarction of unspecified site: Secondary | ICD-10-CM | POA: Diagnosis not present

## 2015-04-03 ENCOUNTER — Encounter (HOSPITAL_COMMUNITY)
Admission: RE | Admit: 2015-04-03 | Discharge: 2015-04-03 | Disposition: A | Discharge status: Home / Self Care | Payer: Medicare Other | Source: Ambulatory Visit | Attending: Cardiology | Admitting: Cardiology

## 2015-04-03 DIAGNOSIS — I213 ST elevation (STEMI) myocardial infarction of unspecified site: Secondary | ICD-10-CM | POA: Diagnosis not present

## 2015-04-05 ENCOUNTER — Encounter (HOSPITAL_COMMUNITY)
Admission: RE | Admit: 2015-04-05 | Discharge: 2015-04-05 | Disposition: A | Discharge status: Home / Self Care | Payer: Medicare Other | Source: Ambulatory Visit | Attending: Cardiology | Admitting: Cardiology

## 2015-04-05 DIAGNOSIS — I213 ST elevation (STEMI) myocardial infarction of unspecified site: Secondary | ICD-10-CM | POA: Diagnosis not present

## 2015-04-08 ENCOUNTER — Encounter (HOSPITAL_COMMUNITY)
Admission: RE | Admit: 2015-04-08 | Discharge: 2015-04-08 | Disposition: A | Discharge status: Home / Self Care | Payer: Medicare Other | Source: Ambulatory Visit | Attending: Cardiology | Admitting: Cardiology

## 2015-04-08 DIAGNOSIS — I213 ST elevation (STEMI) myocardial infarction of unspecified site: Secondary | ICD-10-CM | POA: Diagnosis not present

## 2015-04-10 ENCOUNTER — Encounter (HOSPITAL_COMMUNITY)
Admission: RE | Admit: 2015-04-10 | Discharge: 2015-04-10 | Disposition: A | Discharge status: Home / Self Care | Payer: Medicare Other | Source: Ambulatory Visit | Attending: Cardiology | Admitting: Cardiology

## 2015-04-10 DIAGNOSIS — I213 ST elevation (STEMI) myocardial infarction of unspecified site: Secondary | ICD-10-CM | POA: Diagnosis not present

## 2015-04-12 ENCOUNTER — Encounter (HOSPITAL_COMMUNITY)
Admission: RE | Admit: 2015-04-12 | Discharge: 2015-04-12 | Disposition: A | Discharge status: Home / Self Care | Payer: Medicare Other | Source: Ambulatory Visit | Attending: Cardiology | Admitting: Cardiology

## 2015-04-12 DIAGNOSIS — I213 ST elevation (STEMI) myocardial infarction of unspecified site: Secondary | ICD-10-CM | POA: Diagnosis not present

## 2015-04-15 ENCOUNTER — Encounter (HOSPITAL_COMMUNITY): Payer: Medicare Other

## 2015-04-17 ENCOUNTER — Encounter (HOSPITAL_COMMUNITY)
Admission: RE | Admit: 2015-04-17 | Discharge: 2015-04-17 | Disposition: A | Discharge status: Home / Self Care | Payer: Medicare Other | Source: Ambulatory Visit | Attending: Cardiology | Admitting: Cardiology

## 2015-04-17 DIAGNOSIS — Z955 Presence of coronary angioplasty implant and graft: Secondary | ICD-10-CM | POA: Diagnosis not present

## 2015-04-17 DIAGNOSIS — I213 ST elevation (STEMI) myocardial infarction of unspecified site: Secondary | ICD-10-CM | POA: Diagnosis present

## 2015-04-19 ENCOUNTER — Encounter (HOSPITAL_COMMUNITY)
Admission: RE | Admit: 2015-04-19 | Discharge: 2015-04-19 | Disposition: A | Discharge status: Home / Self Care | Payer: Medicare Other | Source: Ambulatory Visit | Attending: Cardiology | Admitting: Cardiology

## 2015-04-19 DIAGNOSIS — I213 ST elevation (STEMI) myocardial infarction of unspecified site: Secondary | ICD-10-CM | POA: Diagnosis not present

## 2015-04-22 ENCOUNTER — Encounter (HOSPITAL_COMMUNITY)
Admission: RE | Admit: 2015-04-22 | Discharge: 2015-04-22 | Disposition: A | Discharge status: Home / Self Care | Payer: Medicare Other | Source: Ambulatory Visit | Attending: Cardiology | Admitting: Cardiology

## 2015-04-22 DIAGNOSIS — I213 ST elevation (STEMI) myocardial infarction of unspecified site: Secondary | ICD-10-CM | POA: Diagnosis not present

## 2015-04-24 ENCOUNTER — Encounter (HOSPITAL_COMMUNITY)
Admission: RE | Admit: 2015-04-24 | Discharge: 2015-04-24 | Disposition: A | Discharge status: Home / Self Care | Payer: Medicare Other | Source: Ambulatory Visit | Attending: Cardiology | Admitting: Cardiology

## 2015-04-24 DIAGNOSIS — I213 ST elevation (STEMI) myocardial infarction of unspecified site: Secondary | ICD-10-CM | POA: Diagnosis not present

## 2015-04-25 ENCOUNTER — Encounter: Payer: Self-pay | Admitting: Cardiology

## 2015-04-25 ENCOUNTER — Ambulatory Visit (INDEPENDENT_AMBULATORY_CARE_PROVIDER_SITE_OTHER): Payer: Medicare Other | Admitting: Cardiology

## 2015-04-25 VITALS — BP 110/72 | HR 65 | Ht 68.0 in | Wt 223.0 lb

## 2015-04-25 DIAGNOSIS — I25119 Atherosclerotic heart disease of native coronary artery with unspecified angina pectoris: Secondary | ICD-10-CM | POA: Diagnosis not present

## 2015-04-25 DIAGNOSIS — E782 Mixed hyperlipidemia: Secondary | ICD-10-CM

## 2015-04-25 DIAGNOSIS — I10 Essential (primary) hypertension: Secondary | ICD-10-CM | POA: Diagnosis not present

## 2015-04-25 NOTE — Progress Notes (Signed)
Cardiology Office Note  Date: 04/25/2015   ID: Elijah Santana, Elijah Santana 11/04/1932, MRN [id number]  PCP: Glenda Chroman., MD  Primary Cardiologist: Rozann Lesches, MD   Chief Complaint  Patient presents with  . Coronary Artery Disease  . Cardiomyopathy    History of Present Illness: Elijah Santana is an 81 y.o. male last seen in the office in May by Ms. Dunn PA-C. He was seen at that time after having some chest discomfort in cardiac rehabilitation. ECG showed no significant changes, and observation was recommended to see if he had any worsening symptoms consistent with angina. He was continued on medical therapy.  He comes in for a routine visit today. He states that he has had only one other episode of mild angina since last encounter. He has not required any nitroglycerin. His medical regimen has remained stable as outlined below.  He states that he will be completing cardiac rehabilitation soon and does plan to continue exercising, probably a local gym.   Past Medical History  Diagnosis Date  . Coronary atherosclerosis of native coronary artery     a. CABG 2004 with LIMA-LAD, SVG-D1, SVG-OM, SVG-PDA. b. Cath ~2010 with occ of SVG-diagonal, distal LAD and OM filiing by collaterals. c. NSTEMI 12/2014 s/p DES to SVG-RCA.  Marland Kitchen Chronic back pain   . Mixed hyperlipidemia   . Essential hypertension   . Myocardial infarction   . Prostate cancer     Radiation therapy 1996  . NSTEMI (non-ST elevated myocardial infarction) 01/01/15  . Asthma     Childhood  . Hyperglycemia   . Ischemic cardiomyopathy     a. EF 45% by cath 12/2014.     Current Outpatient Prescriptions  Medication Sig Dispense Refill  . amLODipine (NORVASC) 5 MG tablet Take 5 mg by mouth daily.    Marland Kitchen aspirin EC 81 MG tablet Take 81 mg by mouth daily.    . bisacodyl (DULCOLAX) 5 MG EC tablet Take 10-15 mg by mouth daily as needed for moderate constipation.    . clopidogrel (PLAVIX) 75 MG tablet Take 1 tablet (75 mg total) by  mouth daily with breakfast. 90 tablet 0  . doxazosin (CARDURA) 4 MG tablet Take 2 mg by mouth daily.     . isosorbide mononitrate (IMDUR) 60 MG 24 hr tablet Take 1 tablet (60 mg total) by mouth every morning. 30 tablet 6  . metoprolol (LOPRESSOR) 50 MG tablet Take 50 mg by mouth 2 (two) times daily.    Marland Kitchen NITROSTAT 0.4 MG SL tablet PLACE ONE (1) TABLET UNDER TONGUE EVERY 5 MINUTES UP TO (3) DOSES AS NEEDED FOR CHEST PAIN. 25 tablet 3  . pantoprazole (PROTONIX) 40 MG tablet Take 1 tablet (40 mg total) by mouth daily. 90 tablet 0  . Pseudoeph-Doxylamine-DM-APAP (NYQUIL PO) Take 15 mLs by mouth at bedtime as needed (Sleep).    . simvastatin (ZOCOR) 40 MG tablet Take 1 tablet (40 mg total) by mouth at bedtime. 90 tablet 3  . tamsulosin (FLOMAX) 0.4 MG CAPS capsule Take 0.4 mg by mouth daily.     No current facility-administered medications for this visit.    Allergies:  Review of patient's allergies indicates no known allergies.   Social History: The patient  reports that he has never smoked. He quit smokeless tobacco use about 4 years ago. His smokeless tobacco use included Chew. He reports that he does not drink alcohol or use illicit drugs.   ROS:  Please see the history of  present illness. Otherwise, complete review of systems is positive for constipation.  All other systems are reviewed and negative.   Physical Exam: VS:  BP 110/72 mmHg  Pulse 65  Ht 5\' 8"  (1.727 m)  Wt 223 lb (101.152 kg)  BMI 33.91 kg/m2  SpO2 92%, BMI Body mass index is 33.91 kg/(m^2).  Wt Readings from Last 3 Encounters:  04/25/15 223 lb (101.152 kg)  03/21/15 226 lb (102.513 kg)  01/18/15 208 lb (94.348 kg)     Overweight male in no acute distress.  HEENT: Conjunctiva and lids normal, oropharynx clear, nearly edentulous..  Neck: Supple, no elevated jugular venous pressure or bruits.  Lungs: Clear, diminished breath sounds, nonlabored.  Cardiac: Regular rate and rhythm, no S3.  Abdomen: Soft, nontender,  bowel sounds present.  Extremities: No pitting edema, distal pulses 1-2+. Normal left radial. Skin: Warm and dry. [place] skeletal: No kyphosis. Neuropsychiatric: Alert and oriented x3, affect appropriate.   ECG: Tracing from 03/21/2015 showed normal sinus rhythm with borderline low voltage.   Recent Labwork: 12/31/2014: TSH 3.605 01/01/2015: ALT 23; AST 22 01/02/2015: BUN 10; Hemoglobin 12.3*; Platelets 172; Potassium 3.5; Sodium 139 03/07/2015: Creatinine, Ser 1.00     Component Value Date/Time   CHOL 135 01/01/2015 0456   TRIG 168* 01/01/2015 0456   HDL 39* 01/01/2015 0456   CHOLHDL 3.5 01/01/2015 0456   VLDL 34 01/01/2015 0456   LDLCALC 62 01/01/2015 0456    Assessment and Plan:  1. Mild intermittent angina with history of CAD status post CABG, most recently DES to the SVG to RCA in February. Plan to continue medical therapy and observation for now. He remains on DAPT.  2. Essential hypertension, blood pressure is normal today.  3. Hyperlipidemia, remains on Zocor. Continues to follow with Dr. Woody Seller, LDL was 69 back in February.  Current medicines were reviewed with the patient today.   Disposition: FU with me in 6 months.   Signed, Satira Sark, MD, [facility] 04/25/2015 10:22 AM    [facility] [place], [place], [place] [address] Phone: [phone]; Fax: [phone]

## 2015-04-25 NOTE — Patient Instructions (Signed)
Your physician recommends that you continue on your current medications as directed. Please refer to the Current Medication list given to you today. Your physician recommends that you schedule a follow-up appointment in: 6 months. You will receive a reminder letter in the mail in about 4 months reminding you to call and schedule your appointment. If you don't receive this letter, please contact our office. 

## 2015-04-26 ENCOUNTER — Encounter (HOSPITAL_COMMUNITY)
Admission: RE | Admit: 2015-04-26 | Discharge: 2015-04-26 | Disposition: A | Discharge status: Home / Self Care | Payer: Medicare Other | Source: Ambulatory Visit | Attending: Cardiology | Admitting: Cardiology

## 2015-04-26 DIAGNOSIS — I213 ST elevation (STEMI) myocardial infarction of unspecified site: Secondary | ICD-10-CM | POA: Diagnosis not present

## 2015-04-29 ENCOUNTER — Encounter (HOSPITAL_COMMUNITY)
Admission: RE | Admit: 2015-04-29 | Discharge: 2015-04-29 | Disposition: A | Discharge status: Home / Self Care | Payer: Medicare Other | Source: Ambulatory Visit | Attending: Cardiology | Admitting: Cardiology

## 2015-04-29 DIAGNOSIS — I213 ST elevation (STEMI) myocardial infarction of unspecified site: Secondary | ICD-10-CM | POA: Diagnosis not present

## 2015-05-01 ENCOUNTER — Encounter (HOSPITAL_COMMUNITY)
Admission: RE | Admit: 2015-05-01 | Discharge: 2015-05-01 | Disposition: A | Discharge status: Home / Self Care | Payer: Medicare Other | Source: Ambulatory Visit | Attending: Cardiology | Admitting: Cardiology

## 2015-05-01 DIAGNOSIS — I213 ST elevation (STEMI) myocardial infarction of unspecified site: Secondary | ICD-10-CM | POA: Diagnosis not present

## 2015-05-03 ENCOUNTER — Encounter (HOSPITAL_COMMUNITY)
Admission: RE | Admit: 2015-05-03 | Discharge: 2015-05-03 | Disposition: A | Discharge status: Home / Self Care | Payer: Medicare Other | Source: Ambulatory Visit | Attending: Cardiology | Admitting: Cardiology

## 2015-05-03 DIAGNOSIS — I213 ST elevation (STEMI) myocardial infarction of unspecified site: Secondary | ICD-10-CM | POA: Diagnosis not present

## 2015-05-06 ENCOUNTER — Encounter (HOSPITAL_COMMUNITY)
Admission: RE | Admit: 2015-05-06 | Discharge: 2015-05-06 | Disposition: A | Discharge status: Home / Self Care | Payer: Medicare Other | Source: Ambulatory Visit | Attending: Cardiology | Admitting: Cardiology

## 2015-05-06 DIAGNOSIS — I213 ST elevation (STEMI) myocardial infarction of unspecified site: Secondary | ICD-10-CM | POA: Diagnosis not present

## 2015-05-08 ENCOUNTER — Encounter (HOSPITAL_COMMUNITY)
Admission: RE | Admit: 2015-05-08 | Discharge: 2015-05-08 | Disposition: A | Discharge status: Home / Self Care | Payer: Medicare Other | Source: Ambulatory Visit | Attending: Cardiology | Admitting: Cardiology

## 2015-05-08 DIAGNOSIS — I213 ST elevation (STEMI) myocardial infarction of unspecified site: Secondary | ICD-10-CM | POA: Diagnosis not present

## 2015-05-10 ENCOUNTER — Encounter (HOSPITAL_COMMUNITY)
Admission: RE | Admit: 2015-05-10 | Discharge: 2015-05-10 | Disposition: A | Discharge status: Home / Self Care | Payer: Medicare Other | Source: Ambulatory Visit | Attending: Cardiology | Admitting: Cardiology

## 2015-05-10 DIAGNOSIS — I213 ST elevation (STEMI) myocardial infarction of unspecified site: Secondary | ICD-10-CM | POA: Diagnosis not present

## 2015-05-13 ENCOUNTER — Encounter (HOSPITAL_COMMUNITY): Payer: Medicare Other

## 2015-05-15 ENCOUNTER — Other Ambulatory Visit: Payer: Self-pay | Admitting: *Deleted

## 2015-05-15 ENCOUNTER — Other Ambulatory Visit: Payer: Self-pay | Admitting: Cardiology

## 2015-05-15 ENCOUNTER — Encounter (HOSPITAL_COMMUNITY): Payer: Medicare Other

## 2015-05-15 MED ORDER — CLOPIDOGREL BISULFATE 75 MG PO TABS: 75.0000 mg | ORAL_TABLET | Freq: Every day | ORAL | Status: DC

## 2015-05-17 ENCOUNTER — Encounter (HOSPITAL_COMMUNITY): Payer: Medicare Other

## 2015-05-17 NOTE — Progress Notes (Signed)
Patient is discharged from [facility] program today, May 10, 2015 with 36 sessions.  He achieved LTG of 30 minutes of aerobic exercise at max met level of. 3.68  All patient vitals are WNL.  Patient has met with dietician.  Discharge instructions have been reviewed in detail and patient expressed an understanding of material given.  Patient plans to exercise at home and possibly join the maintenance program. Cardiac Rehab will make 1 month, 6 month and 1 year call backs.  Patient had no complaints of any abnormal S/S or pain on their exit visit.  Patient finished post walk test.

## 2015-05-17 NOTE — Progress Notes (Signed)
Cardiac Rehabilitation Program Outcomes Report   Orientation:  01/15/15 Graduate Date:  05/10/15 Discharge Date:  05/10/15 # of sessions completed: 36  Cardiologist: Grant Ruts MD:  Leslye Peer Time:  1100  A.  Exercise Program:  Tolerates exercise @ 3.68 METS for 15 minutes, Walk Test Results:  Post: 2.89 mets, Improved functional capacity  33.2 % and Improved  muscular strength  2.83 %  B.  Mental Health:  Good mental attitude  C.  Education/Instruction/Skills  Accurately checks own pulse.  Rest:  70  Exercise:  92, Knows THR for exercise, Uses Perceived Exertion Scale and/or Dyspnea Scale and Attended 13 education classes  Home exercise given: 05/10/15  D.  Nutrition/Weight Control/Body Composition:  Adherence to prescribed nutrition program: good , Patient weight change: -2.2% and Evidence of fat weight loss   E.  Blood Lipids    Lab Results  Component Value Date   CHOL 135 01/01/2015   HDL 39* 01/01/2015   LDLCALC 62 01/01/2015   TRIG 168* 01/01/2015   CHOLHDL 3.5 01/01/2015    F.  Lifestyle Changes:  Making positive lifestyle changes  G.  Symptoms noted with exercise:  Angina exertional  Report Completed By:  Stevphen Rochester RN   Comments:  Patient has graduated [facility] today.  Patient has done very well in program.

## 2015-05-20 ENCOUNTER — Encounter (HOSPITAL_COMMUNITY): Payer: Medicare Other

## 2015-05-21 ENCOUNTER — Other Ambulatory Visit: Payer: Self-pay | Admitting: *Deleted

## 2015-05-21 MED ORDER — PANTOPRAZOLE SODIUM 40 MG PO TBEC: 40.0000 mg | DELAYED_RELEASE_TABLET | Freq: Every day | ORAL | Status: DC

## 2015-06-28 ENCOUNTER — Ambulatory Visit (INDEPENDENT_AMBULATORY_CARE_PROVIDER_SITE_OTHER): Payer: Medicare Other | Admitting: Urology

## 2015-06-28 DIAGNOSIS — M545 Low back pain: Secondary | ICD-10-CM | POA: Diagnosis not present

## 2015-06-28 DIAGNOSIS — N401 Enlarged prostate with lower urinary tract symptoms: Secondary | ICD-10-CM | POA: Diagnosis not present

## 2015-06-28 DIAGNOSIS — C61 Malignant neoplasm of prostate: Secondary | ICD-10-CM

## 2015-06-28 DIAGNOSIS — R351 Nocturia: Secondary | ICD-10-CM | POA: Diagnosis not present

## 2015-10-29 ENCOUNTER — Encounter: Payer: Self-pay | Admitting: Cardiology

## 2015-10-29 ENCOUNTER — Encounter: Payer: Medicare Other | Admitting: Cardiology

## 2015-10-29 DIAGNOSIS — R0989 Other specified symptoms and signs involving the circulatory and respiratory systems: Secondary | ICD-10-CM

## 2015-10-29 NOTE — Progress Notes (Signed)
Patient canceled.  This encounter was created in error - please disregard. 

## 2015-10-30 ENCOUNTER — Encounter: Payer: Self-pay | Admitting: Cardiology

## 2015-11-01 ENCOUNTER — Ambulatory Visit (INDEPENDENT_AMBULATORY_CARE_PROVIDER_SITE_OTHER): Payer: Medicare Other | Admitting: Urology

## 2015-11-01 DIAGNOSIS — N401 Enlarged prostate with lower urinary tract symptoms: Secondary | ICD-10-CM

## 2015-11-01 DIAGNOSIS — C61 Malignant neoplasm of prostate: Secondary | ICD-10-CM

## 2015-11-01 DIAGNOSIS — R351 Nocturia: Secondary | ICD-10-CM | POA: Diagnosis not present

## 2015-11-01 DIAGNOSIS — R3914 Feeling of incomplete bladder emptying: Secondary | ICD-10-CM

## 2015-11-15 ENCOUNTER — Encounter: Payer: Self-pay | Admitting: Cardiology

## 2015-11-15 ENCOUNTER — Ambulatory Visit (INDEPENDENT_AMBULATORY_CARE_PROVIDER_SITE_OTHER): Payer: Medicare Other | Admitting: Cardiology

## 2015-11-15 ENCOUNTER — Telehealth: Payer: Self-pay | Admitting: Cardiology

## 2015-11-15 ENCOUNTER — Other Ambulatory Visit: Payer: Self-pay | Admitting: Cardiology

## 2015-11-15 ENCOUNTER — Encounter: Payer: Self-pay | Admitting: *Deleted

## 2015-11-15 VITALS — BP 110/68 | HR 62 | Ht 68.0 in | Wt 224.6 lb

## 2015-11-15 DIAGNOSIS — I200 Unstable angina: Secondary | ICD-10-CM

## 2015-11-15 DIAGNOSIS — I10 Essential (primary) hypertension: Secondary | ICD-10-CM | POA: Diagnosis not present

## 2015-11-15 DIAGNOSIS — E782 Mixed hyperlipidemia: Secondary | ICD-10-CM | POA: Diagnosis not present

## 2015-11-15 DIAGNOSIS — I25119 Atherosclerotic heart disease of native coronary artery with unspecified angina pectoris: Secondary | ICD-10-CM | POA: Diagnosis not present

## 2015-11-15 DIAGNOSIS — I255 Ischemic cardiomyopathy: Secondary | ICD-10-CM

## 2015-11-15 NOTE — Progress Notes (Signed)
Cardiology Office Note  Date: 11/15/2015   ID: Elijah Santana, Elijah Santana [birth year], MRN [id number]  PCP: Glenda Chroman., MD  Primary Cardiologist: Rozann Lesches, MD   Chief Complaint  Patient presents with  . Coronary Artery Disease  . Cardiomyopathy  . Progressive angina    History of Present Illness: Elijah Santana is an 81 y.o. male last seen in June. He presents for a follow-up visit. He did complete the cardiac rehabilitation program, states that he has not pursued any regular exercise since that time due to progressive angina symptoms. He tells that he is starting to feel like he did prior to his last intervention in February when he had NSTEMI and underwent DES to the SVG to RCA.  He reports exertional angina on a fairly regular basis, progressive dyspnea on exertion, has been limiting some of his ADLs due to this. Particularly notes symptoms when he puts Wert in his [place] stove and has to go up and down steps. He reports compliance with his medications which are outlined below and stable from a cardiac perspective. He is on a good regimen including aspirin, Plavix, Norvasc, Imdur, Lopressor, and Zocor. Blood pressure and heart rate are very well controlled today.  Today we discussed the situation. Somewhat limited in terms of further up titration of medical therapy, although Imdur could be pushed up higher. I concern is that he is experiencing symptoms very typical for his prior angina, and I suspect that revascularization is going to need to be considered. We reviewed the risks and benefits of a diagnostic cardiac catheterization, and he is in agreement to proceed.   Past Medical History  Diagnosis Date  . Coronary atherosclerosis of native coronary artery     a. CABG 2004 with LIMA-LAD, SVG-D1, SVG-OM, SVG-PDA. b. Cath ~2010 with occ of SVG-diagonal, distal LAD and OM filiing by collaterals. c. NSTEMI 12/2014 s/p DES to SVG-RCA.  Marland Kitchen Chronic back pain   . Mixed hyperlipidemia   .  Essential hypertension   . Myocardial infarction ([place])   . Prostate cancer [facility])     Radiation therapy 1996  . NSTEMI (non-ST elevated myocardial infarction) ([place]) 01/01/15  . Asthma     Childhood  . Hyperglycemia   . Ischemic cardiomyopathy     a. EF 45% by cath 12/2014.    Past Surgical History  Procedure Laterality Date  . Coronary artery bypass graft  2004    LIMA to LAD, SVG to diagonal, SVG to circumflex, SVG to PDA  . Left heart catheterization with coronary/graft angiogram N/A 01/01/2015    Procedure: LEFT HEART CATHETERIZATION WITH Beatrix Fetters;  Surgeon: Peter M Martinique, MD;  Location: [facility] CATH LAB;  Service: Cardiovascular;  Laterality: N/A;  . Cardiac catheterization  01/01/2015    Procedure: CORONARY STENT INTERVENTION;  Surgeon: Peter M Martinique, MD;  Location: [facility] CATH LAB;  Service: Cardiovascular;;  SVG to PDA    Current Outpatient Prescriptions  Medication Sig Dispense Refill  . amLODipine (NORVASC) 5 MG tablet Take 5 mg by mouth daily.    Marland Kitchen aspirin EC 81 MG tablet Take 81 mg by mouth daily.    . bisacodyl (DULCOLAX) 5 MG EC tablet Take 10-15 mg by mouth daily as needed for moderate constipation.    . clopidogrel (PLAVIX) 75 MG tablet Take 1 tablet (75 mg total) by mouth daily with breakfast. 90 tablet 3  . doxazosin (CARDURA) 4 MG tablet Take 2 mg by mouth daily.     Marland Kitchen  isosorbide mononitrate (IMDUR) 60 MG 24 hr tablet Take 1 tablet (60 mg total) by mouth every morning. 30 tablet 6  . metoprolol (LOPRESSOR) 50 MG tablet Take 50 mg by mouth 2 (two) times daily.    Marland Kitchen NITROSTAT 0.4 MG SL tablet PLACE ONE (1) TABLET UNDER TONGUE EVERY 5 MINUTES UP TO (3) DOSES AS NEEDED FOR CHEST PAIN. 25 tablet 3  . pantoprazole (PROTONIX) 40 MG tablet Take 1 tablet (40 mg total) by mouth daily. 90 tablet 3  . simvastatin (ZOCOR) 40 MG tablet TAKE 1 BY MOUTH AT BEDTIME 90 tablet 3   No current facility-administered medications for this visit.   Allergies:  Review of patient's  allergies indicates no known allergies.   Social History: The patient  reports that he has never smoked. He quit smokeless tobacco use about 5 years ago. His smokeless tobacco use included Chew. He reports that he does not drink alcohol or use illicit drugs.   Family History: The patient's family history includes Coronary artery disease in his father; Hypertension in his father.   ROS:  Please see the history of present illness. Otherwise, complete review of systems is positive for decreased hearing, arthritic pains.  All other systems are reviewed and negative.   Physical Exam: VS:  BP 110/68 mmHg  Pulse 62  Ht 5\' 8"  (1.727 m)  Wt 224 lb 9.6 oz (101.878 kg)  BMI 34.16 kg/m2  SpO2 96%, BMI Body mass index is 34.16 kg/(m^2).  Wt Readings from Last 3 Encounters:  11/15/15 224 lb 9.6 oz (101.878 kg)  04/25/15 223 lb (101.152 kg)  03/21/15 226 lb (102.513 kg)    Overweight male in no acute distress.  HEENT: Conjunctiva and lids normal, oropharynx clear, nearly edentulous..  Neck: Supple, no elevated jugular venous pressure or bruits.  Lungs: Clear, diminished breath sounds, nonlabored.  Cardiac: Regular rate and rhythm, no S3.  Abdomen: Soft, nontender, bowel sounds present.  Extremities: No pitting edema, distal pulses 1-2+. Normal left radial. Skin: Warm and dry. Musko skeletal: No kyphosis. Neuropsychiatric: Alert and oriented x3, affect appropriate.  ECG:  Tracing from 03/21/2015 showed sinus rhythm.  Recent Labwork: 12/31/2014: TSH 3.605 01/01/2015: ALT 23; AST 22 01/02/2015: BUN 10; Hemoglobin 12.3*; Platelets 172; Potassium 3.5; Sodium 139 03/07/2015: Creatinine, Ser 1.00     Component Value Date/Time   CHOL 135 01/01/2015 0456   TRIG 168* 01/01/2015 0456   HDL 39* 01/01/2015 0456   CHOLHDL 3.5 01/01/2015 0456   VLDL 34 01/01/2015 0456   LDLCALC 62 01/01/2015 0456   Assessment and Plan:  1. Accelerating angina on good medical regimen for CAD. Blood pressure and  heart rate are well controlled today. Symptoms have been escalating in the last few months, he has not pursued regular activity due to his angina although was able to complete the cardiac rehabilitation program. He has a history of CABG in 2004 with known graft disease now status post NSTEMI and DES to the SVG to RCA in February of this year. He continues on dual antiplatelet therapy. We have discussed the risk and benefits of a diagnostic cardiac catheterization to reassess for revascularization strategies, and he is in agreement to proceed.  2. Essential hypertension, blood pressure well controlled today. Continue current regimen.  3. Hyperlipidemia, and is on Zocor. LDL was 62 in February.  4. Ischemic cardiomyopathy, LVEF was in the 45% range at his last cardiac catheterization. Weight has been stable without any obvious fluid overload. Currently not on diuretic therapy.  Current medicines were reviewed with the patient today.  Disposition: FU with me after cardiac catheterization.  Signed, Satira Sark, MD, [facility] 11/15/2015 4:53 PM    [facility] at [place], [place], [place] [address] Phone: [phone]; Fax: [phone]

## 2015-11-15 NOTE — Patient Instructions (Signed)
Your physician recommends that you schedule a follow-up appointment AFTER YOUR PROCEDURE  Your physician recommends that you continue on your current medications as directed. Please refer to the Current Medication list given to you today.  Your physician has requested that you have a cardiac catheterization. Cardiac catheterization is used to diagnose and/or treat various heart conditions. Doctors may recommend this procedure for a number of different reasons. The most common reason is to evaluate chest pain. Chest pain can be a symptom of coronary artery disease (CAD), and cardiac catheterization can show whether plaque is narrowing or blocking your heart's arteries. This procedure is also used to evaluate the valves, as well as measure the blood flow and oxygen levels in different parts of your heart. For further information please visit [email or web address]. Please follow instruction sheet, as given.  Thank you for choosing [facility]!!

## 2015-11-15 NOTE — Telephone Encounter (Signed)
[facility] 11/21/15 Dr. Claiborne Billings @ 7:30AM    Checking percert

## 2015-11-20 HISTORY — PX: PERCUTANEOUS CORONARY STENT INTERVENTION (PCI-S): SHX6016

## 2015-11-20 NOTE — Telephone Encounter (Signed)
No precert required 

## 2015-11-21 ENCOUNTER — Encounter (HOSPITAL_COMMUNITY)
Admission: RE | Disposition: A | Discharge status: Home / Self Care | Payer: Self-pay | Source: Ambulatory Visit | Attending: Cardiovascular Disease

## 2015-11-21 ENCOUNTER — Encounter (HOSPITAL_COMMUNITY): Payer: Self-pay | Admitting: Cardiovascular Disease

## 2015-11-21 ENCOUNTER — Ambulatory Visit (HOSPITAL_COMMUNITY)
Admission: RE | Admit: 2015-11-21 | Discharge: 2015-11-22 | Disposition: A | Discharge status: Home / Self Care | Payer: Medicare Other | Source: Ambulatory Visit | Attending: Cardiovascular Disease | Admitting: Cardiovascular Disease

## 2015-11-21 DIAGNOSIS — Z7982 Long term (current) use of aspirin: Secondary | ICD-10-CM | POA: Diagnosis not present

## 2015-11-21 DIAGNOSIS — I2582 Chronic total occlusion of coronary artery: Secondary | ICD-10-CM | POA: Diagnosis not present

## 2015-11-21 DIAGNOSIS — Z951 Presence of aortocoronary bypass graft: Secondary | ICD-10-CM | POA: Insufficient documentation

## 2015-11-21 DIAGNOSIS — I25110 Atherosclerotic heart disease of native coronary artery with unstable angina pectoris: Secondary | ICD-10-CM | POA: Diagnosis not present

## 2015-11-21 DIAGNOSIS — I252 Old myocardial infarction: Secondary | ICD-10-CM | POA: Diagnosis not present

## 2015-11-21 DIAGNOSIS — I25710 Atherosclerosis of autologous vein coronary artery bypass graft(s) with unstable angina pectoris: Secondary | ICD-10-CM | POA: Diagnosis not present

## 2015-11-21 DIAGNOSIS — Z955 Presence of coronary angioplasty implant and graft: Secondary | ICD-10-CM

## 2015-11-21 DIAGNOSIS — I200 Unstable angina: Secondary | ICD-10-CM | POA: Insufficient documentation

## 2015-11-21 DIAGNOSIS — I255 Ischemic cardiomyopathy: Secondary | ICD-10-CM | POA: Insufficient documentation

## 2015-11-21 DIAGNOSIS — E782 Mixed hyperlipidemia: Secondary | ICD-10-CM | POA: Insufficient documentation

## 2015-11-21 DIAGNOSIS — E785 Hyperlipidemia, unspecified: Secondary | ICD-10-CM | POA: Diagnosis not present

## 2015-11-21 DIAGNOSIS — Z8546 Personal history of malignant neoplasm of prostate: Secondary | ICD-10-CM | POA: Insufficient documentation

## 2015-11-21 DIAGNOSIS — Z7902 Long term (current) use of antithrombotics/antiplatelets: Secondary | ICD-10-CM | POA: Diagnosis not present

## 2015-11-21 DIAGNOSIS — I25700 Atherosclerosis of coronary artery bypass graft(s), unspecified, with unstable angina pectoris: Secondary | ICD-10-CM | POA: Diagnosis not present

## 2015-11-21 DIAGNOSIS — Z9861 Coronary angioplasty status: Secondary | ICD-10-CM

## 2015-11-21 DIAGNOSIS — I10 Essential (primary) hypertension: Secondary | ICD-10-CM | POA: Insufficient documentation

## 2015-11-21 DIAGNOSIS — I2510 Atherosclerotic heart disease of native coronary artery without angina pectoris: Secondary | ICD-10-CM | POA: Insufficient documentation

## 2015-11-21 HISTORY — PX: CARDIAC CATHETERIZATION: SHX172

## 2015-11-21 LAB — POCT ACTIVATED CLOTTING TIME: Activated Clotting Time: 461 seconds

## 2015-11-21 LAB — BASIC METABOLIC PANEL
Anion gap: 8 (ref 5–15)
BUN: 16 mg/dL (ref 6–20)
CALCIUM: 9.1 mg/dL (ref 8.9–10.3)
CO2: 25 mmol/L (ref 22–32)
Chloride: 109 mmol/L (ref 101–111)
Creatinine, Ser: 0.82 mg/dL (ref 0.61–1.24)
GFR calc Af Amer: >60 mL/min (ref >60)
GLUCOSE: 106 mg/dL — AB (ref 65–99)
POTASSIUM: 3.8 mmol/L (ref 3.5–5.1)
Sodium: 142 mmol/L (ref 135–145)

## 2015-11-21 LAB — CBC
HEMATOCRIT: 36.6 % — AB (ref 39.0–52.0)
HEMOGLOBIN: 11.8 g/dL — AB (ref 13.0–17.0)
MCH: 29.1 pg (ref 26.0–34.0)
MCHC: 32.2 g/dL (ref 30.0–36.0)
MCV: 90.4 fL (ref 78.0–100.0)
PLATELETS: 170 10*3/uL (ref 150–400)
RBC: 4.05 MIL/uL — AB (ref 4.22–5.81)
RDW: 14.3 % (ref 11.5–15.5)
WBC: 5.7 10*3/uL (ref 4.0–10.5)

## 2015-11-21 LAB — PROTIME-INR
INR: 1.07 (ref 0.00–1.49)
Prothrombin Time: 14.1 seconds (ref 11.6–15.2)

## 2015-11-21 SURGERY — LEFT HEART CATH AND CORS/GRAFTS ANGIOGRAPHY
Anesthesia: LOCAL

## 2015-11-21 MED ORDER — LIDOCAINE HCL (PF) 1 % IJ SOLN
INTRAMUSCULAR | Status: DC | PRN
  Administered 2015-11-21: 09:00:00

## 2015-11-21 MED ORDER — AMLODIPINE BESYLATE 5 MG PO TABS
5.0000 mg | ORAL_TABLET | Freq: Every day | ORAL | Status: DC
  Administered 2015-11-21 – 2015-11-22 (×2): 5 mg via ORAL
  Filled 2015-11-21 (×2): qty 1

## 2015-11-21 MED ORDER — DOXAZOSIN MESYLATE 4 MG PO TABS
4.0000 mg | ORAL_TABLET | Freq: Every day | ORAL | Status: DC
  Administered 2015-11-21 – 2015-11-22 (×2): 4 mg via ORAL
  Filled 2015-11-21 (×2): qty 1

## 2015-11-21 MED ORDER — CLOPIDOGREL BISULFATE 75 MG PO TABS: 75.0000 mg | ORAL_TABLET | Freq: Every day | ORAL | Status: DC

## 2015-11-21 MED ORDER — ONDANSETRON HCL 4 MG/2ML IJ SOLN: 4.0000 mg | Freq: Four times a day (QID) | INTRAMUSCULAR | Status: DC | PRN

## 2015-11-21 MED ORDER — SODIUM CHLORIDE 0.9 % IV SOLN: 250.0000 mL | INTRAVENOUS | Status: DC | PRN

## 2015-11-21 MED ORDER — NITROGLYCERIN 1 MG/10 ML FOR IR/CATH LAB
INTRA_ARTERIAL | Status: DC | PRN
  Administered 2015-11-21 (×3): 200 ug via INTRACORONARY

## 2015-11-21 MED ORDER — IOHEXOL 350 MG/ML SOLN
INTRAVENOUS | Status: DC | PRN
  Administered 2015-11-21: 190 mL via INTRA_ARTERIAL

## 2015-11-21 MED ORDER — HEPARIN (PORCINE) IN NACL 2-0.9 UNIT/ML-% IJ SOLN
INTRAMUSCULAR | Status: AC
  Filled 2015-11-21: qty 2000

## 2015-11-21 MED ORDER — SIMVASTATIN 20 MG PO TABS
20.0000 mg | ORAL_TABLET | Freq: Every day | ORAL | Status: DC
  Administered 2015-11-21: 18:00:00 20 mg via ORAL
  Filled 2015-11-21: qty 1

## 2015-11-21 MED ORDER — ASPIRIN 81 MG PO CHEW: 81.0000 mg | CHEWABLE_TABLET | ORAL | Status: DC

## 2015-11-21 MED ORDER — BISACODYL 5 MG PO TBEC: 5.0000 mg | DELAYED_RELEASE_TABLET | Freq: Every day | ORAL | Status: DC | PRN

## 2015-11-21 MED ORDER — ALFUZOSIN HCL ER 10 MG PO TB24
10.0000 mg | ORAL_TABLET | Freq: Every day | ORAL | Status: DC
  Administered 2015-11-21 – 2015-11-22 (×2): 10 mg via ORAL
  Filled 2015-11-21 (×2): qty 1

## 2015-11-21 MED ORDER — ACETAMINOPHEN 325 MG PO TABS
650.0000 mg | ORAL_TABLET | ORAL | Status: DC | PRN
  Administered 2015-11-21: 21:00:00 650 mg via ORAL
  Filled 2015-11-21: qty 2

## 2015-11-21 MED ORDER — PANTOPRAZOLE SODIUM 40 MG PO TBEC
40.0000 mg | DELAYED_RELEASE_TABLET | Freq: Every day | ORAL | Status: DC
  Administered 2015-11-21 – 2015-11-22 (×2): 40 mg via ORAL
  Filled 2015-11-21 (×2): qty 1

## 2015-11-21 MED ORDER — CLOPIDOGREL BISULFATE 300 MG PO TABS
ORAL_TABLET | ORAL | Status: DC | PRN
  Administered 2015-11-21: 300 mg via ORAL

## 2015-11-21 MED ORDER — ASPIRIN EC 81 MG PO TBEC
81.0000 mg | DELAYED_RELEASE_TABLET | Freq: Every day | ORAL | Status: DC
  Administered 2015-11-22: 81 mg via ORAL
  Filled 2015-11-21: qty 1

## 2015-11-21 MED ORDER — CLOPIDOGREL BISULFATE 300 MG PO TABS
ORAL_TABLET | ORAL | Status: AC
  Filled 2015-11-21: qty 1

## 2015-11-21 MED ORDER — SODIUM CHLORIDE 0.9 % IJ SOLN: 3.0000 mL | Freq: Two times a day (BID) | INTRAMUSCULAR | Status: DC

## 2015-11-21 MED ORDER — SODIUM CHLORIDE 0.9 % IV SOLN
INTRAVENOUS | Status: DC
  Administered 2015-11-21: 1000 mL via INTRAVENOUS

## 2015-11-21 MED ORDER — MIDAZOLAM HCL 2 MG/2ML IJ SOLN
INTRAMUSCULAR | Status: AC
  Filled 2015-11-21: qty 2

## 2015-11-21 MED ORDER — METOPROLOL TARTRATE 25 MG PO TABS
50.0000 mg | ORAL_TABLET | Freq: Two times a day (BID) | ORAL | Status: DC
  Administered 2015-11-21 – 2015-11-22 (×2): 50 mg via ORAL
  Filled 2015-11-21 (×2): qty 2

## 2015-11-21 MED ORDER — SODIUM CHLORIDE 0.9 % IJ SOLN: 3.0000 mL | INTRAMUSCULAR | Status: DC | PRN

## 2015-11-21 MED ORDER — TICAGRELOR 90 MG PO TABS
90.0000 mg | ORAL_TABLET | Freq: Two times a day (BID) | ORAL | Status: DC
  Administered 2015-11-22: 10:00:00 90 mg via ORAL
  Filled 2015-11-21: qty 1

## 2015-11-21 MED ORDER — ATROPINE SULFATE 0.1 MG/ML IJ SOLN
INTRAMUSCULAR | Status: AC
  Filled 2015-11-21: qty 10

## 2015-11-21 MED ORDER — TICAGRELOR 90 MG PO TABS
180.0000 mg | ORAL_TABLET | Freq: Once | ORAL | Status: AC
  Administered 2015-11-21: 18:00:00 180 mg via ORAL
  Filled 2015-11-21: qty 2

## 2015-11-21 MED ORDER — RANOLAZINE ER 500 MG PO TB12
500.0000 mg | ORAL_TABLET | Freq: Two times a day (BID) | ORAL | Status: DC
  Administered 2015-11-21 – 2015-11-22 (×3): 500 mg via ORAL
  Filled 2015-11-21 (×3): qty 1

## 2015-11-21 MED ORDER — LIDOCAINE HCL (PF) 1 % IJ SOLN
INTRAMUSCULAR | Status: AC
  Filled 2015-11-21: qty 30

## 2015-11-21 MED ORDER — SODIUM CHLORIDE 0.9 % IJ SOLN
3.0000 mL | INTRAMUSCULAR | Status: DC | PRN
Start: 2015-11-21 — End: 2015-11-21

## 2015-11-21 MED ORDER — MIDAZOLAM HCL 2 MG/2ML IJ SOLN
INTRAMUSCULAR | Status: DC | PRN
  Administered 2015-11-21 (×2): 1 mg via INTRAVENOUS

## 2015-11-21 MED ORDER — SODIUM CHLORIDE 0.9 % IJ SOLN
3.0000 mL | Freq: Two times a day (BID) | INTRAMUSCULAR | Status: DC
  Administered 2015-11-21: 3 mL via INTRAVENOUS

## 2015-11-21 MED ORDER — ISOSORBIDE MONONITRATE ER 60 MG PO TB24
60.0000 mg | ORAL_TABLET | Freq: Every morning | ORAL | Status: DC
  Administered 2015-11-22: 60 mg via ORAL
  Filled 2015-11-21: qty 1

## 2015-11-21 MED ORDER — FENTANYL CITRATE (PF) 100 MCG/2ML IJ SOLN
INTRAMUSCULAR | Status: AC
  Filled 2015-11-21: qty 2

## 2015-11-21 MED ORDER — BIVALIRUDIN BOLUS VIA INFUSION - CUPID
INTRAVENOUS | Status: DC | PRN
  Administered 2015-11-21: 73.125 mg via INTRAVENOUS

## 2015-11-21 MED ORDER — BIVALIRUDIN 250 MG IV SOLR
INTRAVENOUS | Status: AC
  Filled 2015-11-21: qty 250

## 2015-11-21 MED ORDER — ASPIRIN EC 81 MG PO TBEC: 81.0000 mg | DELAYED_RELEASE_TABLET | Freq: Every day | ORAL | Status: DC

## 2015-11-21 MED ORDER — NITROGLYCERIN 1 MG/10 ML FOR IR/CATH LAB
INTRA_ARTERIAL | Status: AC
  Filled 2015-11-21: qty 10

## 2015-11-21 MED ORDER — SODIUM CHLORIDE 0.9 % IV SOLN
INTRAVENOUS | Status: DC
  Administered 2015-11-21: 16:00:00 via INTRAVENOUS

## 2015-11-21 MED ORDER — FENTANYL CITRATE (PF) 100 MCG/2ML IJ SOLN
INTRAMUSCULAR | Status: DC | PRN
  Administered 2015-11-21 (×2): 25 ug via INTRAVENOUS

## 2015-11-21 MED ORDER — BIVALIRUDIN 250 MG IV SOLR
250.0000 mg | INTRAVENOUS | Status: DC | PRN
  Administered 2015-11-21: 1.75 mg/kg/h via INTRAVENOUS

## 2015-11-21 SURGICAL SUPPLY — 20 items
BALLN EMERGE MR 2.0X8 (BALLOONS) ×2
BALLN ~~LOC~~ EUPHORA RX 2.75X8 (BALLOONS) ×2
BALLOON EMERGE MR 2.0X8 (BALLOONS) IMPLANT
BALLOON ~~LOC~~ EUPHORA RX 2.75X8 (BALLOONS) IMPLANT
CATH INFINITI 5 FR IM (CATHETERS) ×1 IMPLANT
CATH INFINITI 5 FR LCB (CATHETERS) ×1 IMPLANT
CATH INFINITI 5FR MULTPACK ANG (CATHETERS) ×1 IMPLANT
GUIDE CATH RUNWAY 6FR RCB (CATHETERS) ×1 IMPLANT
KIT ENCORE 26 ADVANTAGE (KITS) ×1 IMPLANT
KIT HEART LEFT (KITS) ×2 IMPLANT
PACK CARDIAC CATHETERIZATION (CUSTOM PROCEDURE TRAY) ×2 IMPLANT
SHEATH PINNACLE 5F 10CM (SHEATH) ×1 IMPLANT
SHEATH PINNACLE 6F 10CM (SHEATH) ×1 IMPLANT
STENT XIENCE ALPINE RX 2.5X12 (Permanent Stent) ×1 IMPLANT
SYR MEDRAD MARK V 150ML (SYRINGE) ×2 IMPLANT
TRANSDUCER W/STOPCOCK (MISCELLANEOUS) ×2 IMPLANT
TUBING CIL FLEX 10 FLL-RA (TUBING) ×2 IMPLANT
WIRE ASAHI PROWATER 180CM (WIRE) ×1 IMPLANT
WIRE EMERALD 3MM-J .035X150CM (WIRE) ×1 IMPLANT
WIRE EMERALD ST .035X150CM (WIRE) ×1 IMPLANT

## 2015-11-21 NOTE — Research (Signed)
TWILIGHT Research Study Informed Consent   Subject Name: Elijah Santana  Subject met inclusion and exclusion criteria.  The informed consent form, study requirements and expectations were reviewed with the subject and questions and concerns were addressed prior to the signing of the consent form.  The subject verbalized understanding of the trial requirements.  The subject agreed to participate in the [place] trial and signed the informed consent.  The informed consent was obtained prior to performance of any protocol-specific procedures for the subject.  A copy of the signed informed consent was given to the subject and a copy was placed in the subject's medical record.  Desmond Dike H 11/21/2015, 1:50 PM

## 2015-11-21 NOTE — Care Management Note (Signed)
Case Management Note  Patient Details  Name: Elijah Santana MRN: [id number] Date of Birth: [birth year]  Subjective/Objective:        Ischemic cardiomyopathy, CAD            Action/Plan: NCM spoke to pt. States he plans to participate in the [place] program for Brilinta were he will receive medication for one year. No additional NCM needs identified.   Expected Discharge Date:  11/22/2015               Expected Discharge Plan:  Home/Self Care  In-House Referral:  NA  Discharge planning Services  CM Consult  Post Acute Care Choice:  NA Choice offered to:  NA  DME Arranged:  N/A DME Agency:  NA  HH Arranged:  NA HH Agency:  NA  Status of Service:  Completed, signed off  Medicare Important Message Given:    Date Medicare IM Given:    Medicare IM give by:    Date Additional Medicare IM Given:    Additional Medicare Important Message give by:     If discussed at [place] of Stay Meetings, dates discussed:    Additional Comments:  Erenest Rasher, RN 11/21/2015, 6:17 PM

## 2015-11-21 NOTE — Progress Notes (Signed)
Asked by research dept/Dr. Claiborne Billings to enter Brilinta order. 180mg  tonight then 90mg  BID tomorrow. Plavix discontinued.  Taiwan Millon PA-C

## 2015-11-21 NOTE — Progress Notes (Signed)
Site area: right groin  Site Prior to Removal:  Level 0  Pressure Applied For 20 MINUTES    Minutes Beginning at 1155  Manual:   Yes.    Patient Status During Pull:  AAO X4  Post Pull Groin Site:  Level 0  Post Pull Instructions Given:  Yes.    Post Pull Pulses Present:  Yes.    Dressing Applied:  Yes.    Comments:Tolerated procedure well 

## 2015-11-21 NOTE — H&P (View-Only) (Signed)
Cardiology Office Note  Date: 11/15/2015   ID: Elijah, Santana [birth year], MRN [id number]  PCP: Glenda Chroman., MD  Primary Cardiologist: Rozann Lesches, MD   Chief Complaint  Patient presents with  . Coronary Artery Disease  . Cardiomyopathy  . Progressive angina    History of Present Illness: Elijah Santana is an 82 y.o. male last seen in June. He presents for a follow-up visit. He did complete the cardiac rehabilitation program, states that he has not pursued any regular exercise since that time due to progressive angina symptoms. He tells that he is starting to feel like he did prior to his last intervention in February when he had NSTEMI and underwent DES to the SVG to RCA.  He reports exertional angina on a fairly regular basis, progressive dyspnea on exertion, has been limiting some of his ADLs due to this. Particularly notes symptoms when he puts [place] in his [place] stove and has to go up and down steps. He reports compliance with his medications which are outlined below and stable from a cardiac perspective. He is on a good regimen including aspirin, Plavix, Norvasc, Imdur, Lopressor, and Zocor. Blood pressure and heart rate are very well controlled today.  Today we discussed the situation. Somewhat limited in terms of further up titration of medical therapy, although Imdur could be pushed up higher. I concern is that he is experiencing symptoms very typical for his prior angina, and I suspect that revascularization is going to need to be considered. We reviewed the risks and benefits of a diagnostic cardiac catheterization, and he is in agreement to proceed.   Past Medical History  Diagnosis Date  . Coronary atherosclerosis of native coronary artery     a. CABG 2004 with LIMA-LAD, SVG-D1, SVG-OM, SVG-PDA. b. Cath ~2010 with occ of SVG-diagonal, distal LAD and OM filiing by collaterals. c. NSTEMI 12/2014 s/p DES to SVG-RCA.  Marland Kitchen Chronic back pain   . Mixed hyperlipidemia   .  Essential hypertension   . Myocardial infarction ([place])   . Prostate cancer [facility])     Radiation therapy 1996  . NSTEMI (non-ST elevated myocardial infarction) ([place]) 01/01/15  . Asthma     Childhood  . Hyperglycemia   . Ischemic cardiomyopathy     a. EF 45% by cath 12/2014.    Past Surgical History  Procedure Laterality Date  . Coronary artery bypass graft  2004    LIMA to LAD, SVG to diagonal, SVG to circumflex, SVG to PDA  . Left heart catheterization with coronary/graft angiogram N/A 01/01/2015    Procedure: LEFT HEART CATHETERIZATION WITH Beatrix Fetters;  Surgeon: Peter M Martinique, MD;  Location: [facility] CATH LAB;  Service: Cardiovascular;  Laterality: N/A;  . Cardiac catheterization  01/01/2015    Procedure: CORONARY STENT INTERVENTION;  Surgeon: Peter M Martinique, MD;  Location: [facility] CATH LAB;  Service: Cardiovascular;;  SVG to PDA    Current Outpatient Prescriptions  Medication Sig Dispense Refill  . amLODipine (NORVASC) 5 MG tablet Take 5 mg by mouth daily.    Marland Kitchen aspirin EC 81 MG tablet Take 81 mg by mouth daily.    . bisacodyl (DULCOLAX) 5 MG EC tablet Take 10-15 mg by mouth daily as needed for moderate constipation.    . clopidogrel (PLAVIX) 75 MG tablet Take 1 tablet (75 mg total) by mouth daily with breakfast. 90 tablet 3  . doxazosin (CARDURA) 4 MG tablet Take 2 mg by mouth daily.     Marland Kitchen  isosorbide mononitrate (IMDUR) 60 MG 24 hr tablet Take 1 tablet (60 mg total) by mouth every morning. 30 tablet 6  . metoprolol (LOPRESSOR) 50 MG tablet Take 50 mg by mouth 2 (two) times daily.    Marland Kitchen NITROSTAT 0.4 MG SL tablet PLACE ONE (1) TABLET UNDER TONGUE EVERY 5 MINUTES UP TO (3) DOSES AS NEEDED FOR CHEST PAIN. 25 tablet 3  . pantoprazole (PROTONIX) 40 MG tablet Take 1 tablet (40 mg total) by mouth daily. 90 tablet 3  . simvastatin (ZOCOR) 40 MG tablet TAKE 1 BY MOUTH AT BEDTIME 90 tablet 3   No current facility-administered medications for this visit.   Allergies:  Review of patient's  allergies indicates no known allergies.   Social History: The patient  reports that he has never smoked. He quit smokeless tobacco use about 5 years ago. His smokeless tobacco use included Chew. He reports that he does not drink alcohol or use illicit drugs.   Family History: The patient's family history includes Coronary artery disease in his father; Hypertension in his father.   ROS:  Please see the history of present illness. Otherwise, complete review of systems is positive for decreased hearing, arthritic pains.  All other systems are reviewed and negative.   Physical Exam: VS:  BP 110/68 mmHg  Pulse 62  Ht 5\' 8"  (1.727 m)  Wt 224 lb 9.6 oz (101.878 kg)  BMI 34.16 kg/m2  SpO2 96%, BMI Body mass index is 34.16 kg/(m^2).  Wt Readings from Last 3 Encounters:  11/15/15 224 lb 9.6 oz (101.878 kg)  04/25/15 223 lb (101.152 kg)  03/21/15 226 lb (102.513 kg)    Overweight male in no acute distress.  HEENT: Conjunctiva and lids normal, oropharynx clear, nearly edentulous..  Neck: Supple, no elevated jugular venous pressure or bruits.  Lungs: Clear, diminished breath sounds, nonlabored.  Cardiac: Regular rate and rhythm, no S3.  Abdomen: Soft, nontender, bowel sounds present.  Extremities: No pitting edema, distal pulses 1-2+. Normal left radial. Skin: Warm and dry. [place] skeletal: No kyphosis. Neuropsychiatric: Alert and oriented x3, affect appropriate.  ECG:  Tracing from 03/21/2015 showed sinus rhythm.  Recent Labwork: 12/31/2014: TSH 3.605 01/01/2015: ALT 23; AST 22 01/02/2015: BUN 10; Hemoglobin 12.3*; Platelets 172; Potassium 3.5; Sodium 139 03/07/2015: Creatinine, Ser 1.00     Component Value Date/Time   CHOL 135 01/01/2015 0456   TRIG 168* 01/01/2015 0456   HDL 39* 01/01/2015 0456   CHOLHDL 3.5 01/01/2015 0456   VLDL 34 01/01/2015 0456   LDLCALC 62 01/01/2015 0456   Assessment and Plan:  1. Accelerating angina on good medical regimen for CAD. Blood pressure and  heart rate are well controlled today. Symptoms have been escalating in the last few months, he has not pursued regular activity due to his angina although was able to complete the cardiac rehabilitation program. He has a history of CABG in 2004 with known graft disease now status post NSTEMI and DES to the SVG to RCA in February of this year. He continues on dual antiplatelet therapy. We have discussed the risk and benefits of a diagnostic cardiac catheterization to reassess for revascularization strategies, and he is in agreement to proceed.  2. Essential hypertension, blood pressure well controlled today. Continue current regimen.  3. Hyperlipidemia, and is on Zocor. LDL was 62 in February.  4. Ischemic cardiomyopathy, LVEF was in the 45% range at his last cardiac catheterization. Weight has been stable without any obvious fluid overload. Currently not on diuretic therapy.  Current medicines were reviewed with the patient today.  Disposition: FU with me after cardiac catheterization.  Signed, Satira Sark, MD, [facility] 11/15/2015 4:53 PM    [facility] at [place], [place], [place] [address] Phone: [phone]; Fax: [phone]

## 2015-11-21 NOTE — Interval H&P Note (Signed)
Cath Lab Visit (complete for each Cath Lab visit)  Clinical Evaluation Leading to the Procedure:   ACS: No.  Non-ACS:    Anginal Classification: CCS III  Anti-ischemic medical therapy: Maximal Therapy (2 or more classes of medications)  Non-Invasive Test Results: No non-invasive testing performed  Prior CABG: Previous CABG      History and Physical Interval Note:  11/21/2015 7:42 AM  Elijah Santana  has presented today for surgery, with the diagnosis of accelerating angina  The various methods of treatment have been discussed with the patient and family. After consideration of risks, benefits and other options for treatment, the patient has consented to  Procedure(s): Left Heart Cath and Cors/Grafts Angiography (N/A) as a surgical intervention .  The patient's history has been reviewed, patient examined, no change in status, stable for surgery.  I have reviewed the patient's chart and labs.  Questions were answered to the patient's satisfaction.     Shanica Castellanos A

## 2015-11-22 ENCOUNTER — Encounter (HOSPITAL_COMMUNITY): Payer: Self-pay | Admitting: Student

## 2015-11-22 ENCOUNTER — Telehealth: Payer: Self-pay | Admitting: *Deleted

## 2015-11-22 ENCOUNTER — Other Ambulatory Visit: Payer: Self-pay

## 2015-11-22 DIAGNOSIS — E785 Hyperlipidemia, unspecified: Secondary | ICD-10-CM | POA: Diagnosis not present

## 2015-11-22 DIAGNOSIS — I25700 Atherosclerosis of coronary artery bypass graft(s), unspecified, with unstable angina pectoris: Secondary | ICD-10-CM | POA: Diagnosis not present

## 2015-11-22 DIAGNOSIS — I10 Essential (primary) hypertension: Secondary | ICD-10-CM | POA: Diagnosis not present

## 2015-11-22 DIAGNOSIS — Z951 Presence of aortocoronary bypass graft: Secondary | ICD-10-CM | POA: Diagnosis not present

## 2015-11-22 DIAGNOSIS — I200 Unstable angina: Secondary | ICD-10-CM

## 2015-11-22 DIAGNOSIS — I2510 Atherosclerotic heart disease of native coronary artery without angina pectoris: Secondary | ICD-10-CM | POA: Diagnosis not present

## 2015-11-22 DIAGNOSIS — I2582 Chronic total occlusion of coronary artery: Secondary | ICD-10-CM | POA: Diagnosis not present

## 2015-11-22 DIAGNOSIS — I25110 Atherosclerotic heart disease of native coronary artery with unstable angina pectoris: Secondary | ICD-10-CM | POA: Diagnosis not present

## 2015-11-22 LAB — CBC
HCT: 38.1 % — ABNORMAL LOW (ref 39.0–52.0)
Hemoglobin: 12.1 g/dL — ABNORMAL LOW (ref 13.0–17.0)
MCH: 28.6 pg (ref 26.0–34.0)
MCHC: 31.8 g/dL (ref 30.0–36.0)
MCV: 90.1 fL (ref 78.0–100.0)
PLATELETS: 171 10*3/uL (ref 150–400)
RBC: 4.23 MIL/uL (ref 4.22–5.81)
RDW: 14.2 % (ref 11.5–15.5)
WBC: 6.1 10*3/uL (ref 4.0–10.5)

## 2015-11-22 LAB — BASIC METABOLIC PANEL
Anion gap: 7 (ref 5–15)
BUN: 13 mg/dL (ref 6–20)
CO2: 24 mmol/L (ref 22–32)
CREATININE: 0.93 mg/dL (ref 0.61–1.24)
Calcium: 9.1 mg/dL (ref 8.9–10.3)
Chloride: 111 mmol/L (ref 101–111)
GFR calc Af Amer: >60 mL/min (ref >60)
Glucose, Bld: 117 mg/dL — ABNORMAL HIGH (ref 65–99)
Potassium: 3.6 mmol/L (ref 3.5–5.1)
SODIUM: 142 mmol/L (ref 135–145)

## 2015-11-22 MED ORDER — RANOLAZINE ER 500 MG PO TB12: 500.0000 mg | ORAL_TABLET | Freq: Two times a day (BID) | ORAL | Status: DC

## 2015-11-22 MED ORDER — ANGIOPLASTY BOOK
Freq: Once | Status: AC
  Administered 2015-11-22: 08:00:00
  Filled 2015-11-22: qty 1

## 2015-11-22 MED ORDER — TICAGRELOR 90 MG PO TABS: 90.0000 mg | ORAL_TABLET | Freq: Two times a day (BID) | ORAL | Status: DC

## 2015-11-22 MED ORDER — HEART ATTACK BOUNCING BOOK
Freq: Once | Status: DC
  Filled 2015-11-22: qty 1

## 2015-11-22 MED ORDER — NITROGLYCERIN 0.4 MG SL SUBL: SUBLINGUAL_TABLET | SUBLINGUAL | Status: DC

## 2015-11-22 NOTE — Discharge Instructions (Signed)

## 2015-11-22 NOTE — Telephone Encounter (Signed)
Patient said he could not afford the $45 copay for ranexa and is requesting that it be changed to something else. Patient advised that samples would be given to him and he can discuss changing medication at his upcoming visit with [facility]. Patient verbalized understanding of plan.

## 2015-11-22 NOTE — Care Management Note (Signed)
Case Management Note  Patient Details  Name: Elijah Santana MRN: [id number] Date of Birth: [birth year]  Subjective/Objective:     Patient will be participating in the [place] program where he will get Brilinta for a year.  No other needs identified.                Action/Plan:   Expected Discharge Date:                  Expected Discharge Plan:  Home/Self Care  In-House Referral:  NA  Discharge planning Services  CM Consult  Post Acute Care Choice:  NA Choice offered to:  NA  DME Arranged:  N/A DME Agency:  NA  HH Arranged:  NA HH Agency:  NA  Status of Service:  Completed, signed off  Medicare Important Message Given:    Date Medicare IM Given:    Medicare IM give by:    Date Additional Medicare IM Given:    Additional Medicare Important Message give by:     If discussed at [place] of Stay Meetings, dates discussed:    Additional Comments:  Zenon Mayo, RN 11/22/2015, 11:37 AM

## 2015-11-22 NOTE — Discharge Summary (Signed)
CARDIOLOGY DISCHARGE SUMMARY   Patient ID: Elijah Santana MRN: [id number] DOB/AGE: 11/22/1931 82 y.o.  Admit date: 11/21/2015 Discharge date: 11/22/2015  PCP: Glenda Chroman., MD Primary Cardiologist: Dr. Domenic Polite  Primary Discharge Diagnosis:Accelerating angina [facility]) Secondary Discharge Diagnosis: Coronary artery disease involving coronary bypass graft of native heart with unstable angina pectoris ([place]), CAD in native artery Hyperlipidemia,  HTN   Consults: None Procedures: Left Heart Catheterization, Coronary Angiography, Coronary Stent Intervention  Hospital Course: Elijah Santana is a 82 y.o. male with past medical history of CAD (s/p CABG 2004 with LIMA-LAD, SVG-D1, SVG-OM, SVG-PDA, known occlusion of SVG-diagonal, DES to SVG-RCA in 12/2014), HLD, HTN, and ischemic cardiomyopathy (EF 45%) who presented to [facility] on 11/21/2015 for cardiac catheterization in the setting of accelerating angina.  He was seen in the office by Dr. Domenic Polite on 11/15/2015 and reported having exertional chest pain and dyspnea on exertion which had been limiting his ability to perform ADL's. A cardiac catheterization was recommended at that time. The risks and benefits of the procedure were discussed with the patient and he agreed to proceed.   His cardiac catheterization was performed on 11/21/2015 and showed 85% stenosis in the distal graft of the SVG-distal RCA. Xience Alpine DES was placed with a 0% residual stenosis. The full report is included below. No complications were noted during or following the procedure.  He was started on ASA and Brilinta in place of Plavix. He agreed to enroll into the Twilight study and will receive his medications through the study. Ranexa 500mg  BID was also added to his medication regimen of Imdur and a BB.  On the morning following his catheterization, he was doing well and denied any chest pain or dyspnea. His right groin cath site had a large ecchymosis but no evidence  of a hematoma. His vitals and lab results were reviewed and appeared stable. He ambulated over 500 ft with cardiac rehab without difficulty.  The patient was last examined by Dr. Irish Lack and deemed stable for discharge. He has scheduled Cardiology follow-up on 12/06/2015 with Dr. Domenic Polite.  Labs:   Lab Results  Component Value Date   WBC 6.1 11/22/2015   HGB 12.1* 11/22/2015   HCT 38.1* 11/22/2015   MCV 90.1 11/22/2015   PLT 171 11/22/2015     Recent Labs Lab 11/22/15 0435  NA 142  K 3.6  CL 111  CO2 24  BUN 13  CREATININE 0.93  CALCIUM 9.1  GLUCOSE 117*   Lipid Panel     Component Value Date/Time   CHOL 135 01/01/2015 0456   TRIG 168* 01/01/2015 0456   HDL 39* 01/01/2015 0456   CHOLHDL 3.5 01/01/2015 0456   VLDL 34 01/01/2015 0456   LDLCALC 62 01/01/2015 0456     Recent Labs  11/21/15 0621  INR 1.07      Cardiac Catheterization: 11/21/2015  Ramus lesion, 99% stenosed.  Ost LAD lesion, 99% stenosed.  Prox LAD lesion, 100% stenosed.  Prox Cx to Mid Cx lesion, 100% stenosed.  Prox RCA lesion, 100% stenosed.  Ost RCA lesion, 99% stenosed.  SVG was injected is normal in caliber.  There is severe disease in the graft.  SVG was injected is normal in caliber, and is anatomically normal.  SVG was injected .  There is severe disease in the graft.  Origin lesion, 100% stenosed.  LIMA was injected is normal in caliber, and is anatomically normal.  Dist LAD lesion, 100% stenosed.  There is mild  left ventricular systolic dysfunction.  Dist Graft lesion, 85% stenosed. Post intervention, there is a 0% residual stenosis.  Mild LV dysfunction with a focal area of severe hypo-to akinesis involving the mid anterolateral wall. Global ejection fraction at 45 to less than 50%. LVEDP 22 mm Hg.  Significant native CAD with a 99% ostial LAD stenosis followed by total occlusion of the proximal LAD; 99% proximal stenosis of a ramus immediate vessel; total  occlusion of the circumflex following the first OM branch with faint distal filling of a second marginal branch and total occlusion of the proximal RCA.   Patent vein graft supplying the distal circumflex marginal vessel which then collateralizes the distal LAD and diagonal vessel.  Old occluded vein graft, which had supplied the diagonal vessel.  SVGsupplying the distal RCA with previously placed proximal to mid stent that is widely patent, but evidence for a new 85% distal stenosis prior to the distal anastamosis.  Patent LIMA graft supplying the mid LAD and filling the distal LAD ending proximal to the apex. The distal LAD is occluded and there are faint apical LAD collaterals.  Successful percutaneous coronary intervention to the distal SVG 85% stenosis with ultimate insertion of a 2.512 mm Xience Alpine DES stent postdilated to 2.63 mm with the stenosis being reduced to 0%.   EKG: Sinus rhythm, 1st degree AV block, HR in 60's.    FOLLOW UP PLANS AND APPOINTMENTS No Known Allergies   Medication List    STOP taking these medications        clopidogrel 75 MG tablet  Commonly known as:  PLAVIX     ibuprofen 200 MG tablet  Commonly known as:  ADVIL,MOTRIN      TAKE these medications        alfuzosin 10 MG 24 hr tablet  Commonly known as:  UROXATRAL  Take 10 mg by mouth daily.     amLODipine 5 MG tablet  Commonly known as:  NORVASC  Take 5 mg by mouth daily.     aspirin EC 81 MG tablet  Take 81 mg by mouth daily.     bisacodyl 5 MG EC tablet  Commonly known as:  DULCOLAX  Take 5-10 mg by mouth daily as needed (constipation).     doxazosin 4 MG tablet  Commonly known as:  CARDURA  Take 4 mg by mouth daily.     isosorbide mononitrate 60 MG 24 hr tablet  Commonly known as:  IMDUR  Take 1 tablet (60 mg total) by mouth every morning.  Notes to Patient:  Tomorrow 11/23/15      metoprolol 50 MG tablet  Commonly known as:  LOPRESSOR  Take 50 mg by mouth 2 (two)  times daily.     nitroGLYCERIN 0.4 MG SL tablet  Commonly known as:  NITROSTAT  PLACE ONE (1) TABLET UNDER TONGUE EVERY 5 MINUTES UP TO (3) DOSES AS NEEDED FOR CHEST PAIN.     pantoprazole 40 MG tablet  Commonly known as:  PROTONIX  Take 1 tablet (40 mg total) by mouth daily.     PRESCRIPTION MEDICATION  Hormone shots done at Dr. Ralene Muskrat office once every 4 months (for prostrate) - last injection mid December 2016     ranolazine 500 MG 12 hr tablet  Commonly known as:  RANEXA  Take 1 tablet (500 mg total) by mouth 2 (two) times daily.     simvastatin 40 MG tablet  Commonly known as:  ZOCOR  TAKE 1 BY MOUTH AT  BEDTIME     ticagrelor 90 MG Tabs tablet  Commonly known as:  BRILINTA  Take 1 tablet (90 mg total) by mouth 2 (two) times daily.        Discharge Instructions    Amb Referral to Cardiac Rehabilitation    Complete by:  As directed   Diagnosis:  PCI          Follow-up Information    Follow up with Rozann Lesches, MD On 12/06/2015.   Specialty:  Cardiology   Why:  Cardiology Follow-Up on 12/06/2015 at 1:00PM.   Contact information:   [place] [address] [phone]       BRING ALL MEDICATIONS WITH YOU TO FOLLOW UP APPOINTMENTS  Time spent with patient to include physician time: 40 minutes Signed: Erma Heritage, PA 11/22/2015, 11:41 AM Co-Sign MD    I have examined the patient and reviewed assessment and plan and discussed with patient. Agree with above as stated. Right groin bruised. 2+ right PT pulse. Changing to Brilinta as part of the Twiilight study. Was on clopidogrel. Continue aggressive secondary prevention with management of HTN and hyperlipidemia.  Cebert Dettmann S.

## 2015-11-22 NOTE — Progress Notes (Signed)
Patient Name: Elijah Santana Date of Encounter: 11/22/2015  Active Problems:   Accelerating angina [facility])   Coronary artery disease involving coronary bypass graft of native heart with unstable angina pectoris ([place])   CAD in native artery    Primary Cardiologist: Dr. Domenic Polite Patient Profile: 82 yo male w/ PMH of CAD (s/p CABG 2004 with LIMA-LAD, SVG-D1, SVG-OM, SVG-PDA, known occlusion of SVG-diagonal, DES to SVG-RCA in 12/2014), HLD, HTN, and ischemic cardiomyopathy (EF 45%) who presented to [facility] on 11/21/2015 for cardiac catheterization in the setting of accelerating angina.  SUBJECTIVE: Denies any chest pain, palpitations, or shortness of breath overnight or this morning. Has a large ecchymosis at his right groin site but no evidence of a hematoma.  OBJECTIVE Filed Vitals:   11/21/15 2111 11/21/15 2200 11/22/15 0300 11/22/15 0346  BP:   163/63 145/87  Pulse: 80  71 63  Temp:   97 F (36.1 C) 97.5 F (36.4 C)  TempSrc:   Oral Oral  Resp: 24 14 17 11   Height:      Weight:    226 lb 3.1 oz (102.6 kg)  SpO2: 98%  100% 96%    Intake/Output Summary (Last 24 hours) at 11/22/15 [id number] Last data filed at 11/22/15 [id number]  Gross per 24 hour  Intake    540 ml  Output   3400 ml  Net  -2860 ml   Filed Weights   11/21/15 0607 11/22/15 0346  Weight: 215 lb (97.523 kg) 226 lb 3.1 oz (102.6 kg)    PHYSICAL EXAM General: Well developed, well nourished, male in no acute distress. Head: Normocephalic, atraumatic.  Neck: Supple without bruits, JVD not elevated. Lungs:  Resp regular and unlabored, CTA without wheezing or rales. Heart: RRR, S1, S2, no S3, S4, or murmur; no rub. Abdomen: Soft, non-tender, non-distended with normoactive bowel sounds. No hepatomegaly. No rebound/guarding. No obvious abdominal masses. Extremities: No clubbing, cyanosis, or edema. Distal pedal pulses are 2+ bilaterally. Right groin site with large ecchymosis, no evidence of a hematoma. Neuro: Alert and  oriented X 3. Moves all extremities spontaneously. Psych: Normal affect.   LABS: CBC: Recent Labs  11/21/15 0621 11/22/15 0435  WBC 5.7 6.1  HGB 11.8* 12.1*  HCT 36.6* 38.1*  MCV 90.4 90.1  PLT 170 171   INR: Recent Labs  11/21/15 0621  INR [id number]   Basic Metabolic Panel: Recent Labs  11/21/15 0621 11/22/15 0435  NA 142 142  K 3.8 3.6  CL 109 111  CO2 25 24  GLUCOSE 106* 117*  BUN 16 13  CREATININE 0.82 0.93  CALCIUM 9.1 9.1    TELE: NSR with rate in mid-50's - 70's. Frequent PVC's.  ECG: SR, HR in 60's, 1st degree AV Block       Cardiac Catheterization: 11/21/2015  Ramus lesion, 99% stenosed.  Ost LAD lesion, 99% stenosed.  Prox LAD lesion, 100% stenosed.  Prox Cx to Mid Cx lesion, 100% stenosed.  Prox RCA lesion, 100% stenosed.  Ost RCA lesion, 99% stenosed.  SVG was injected is normal in caliber.  There is severe disease in the graft.  SVG was injected is normal in caliber, and is anatomically normal.  SVG was injected .  There is severe disease in the graft.  Origin lesion, 100% stenosed.  LIMA was injected is normal in caliber, and is anatomically normal.  Dist LAD lesion, 100% stenosed.  There is mild left ventricular systolic dysfunction.  Dist Graft lesion, 85% stenosed. Post  intervention, there is a 0% residual stenosis.  Mild LV dysfunction with a focal area of severe hypo-to akinesis involving the mid anterolateral wall. Global ejection fraction at 45 to less than 50%. LVEDP 22 mm Hg.  Significant native CAD with a 99% ostial LAD stenosis followed by total occlusion of the proximal LAD; 99% proximal stenosis of a ramus immediate vessel; total occlusion of the circumflex following the first OM branch with faint distal filling of a second marginal branch and total occlusion of the proximal RCA.   Patent vein graft supplying the distal circumflex marginal vessel which then collateralizes the distal LAD and diagonal vessel.  Old  occluded vein graft, which had supplied the diagonal vessel.  SVGsupplying the distal RCA with previously placed proximal to mid stent that is widely patent, but evidence for a new 85% distal stenosis prior to the distal anastamosis.  Patent LIMA graft supplying the mid LAD and filling the distal LAD ending proximal to the apex. The distal LAD is occluded and there are faint apical LAD collaterals.  Successful percutaneous coronary intervention to the distal SVG 85% stenosis with ultimate insertion of a 2.512 mm Xience Alpine DES stent postdilated to 2.63 mm with the stenosis being reduced to 0%.  Current Medications:  . alfuzosin  10 mg Oral Daily  . amLODipine  5 mg Oral Daily  . angioplasty book   Does not apply Once  . aspirin EC  81 mg Oral Daily  . doxazosin  4 mg Oral Daily  . heart attack bouncing book   Does not apply Once  . isosorbide mononitrate  60 mg Oral q morning - 10a  . metoprolol  50 mg Oral BID  . pantoprazole  40 mg Oral Daily  . ranolazine  500 mg Oral BID  . simvastatin  20 mg Oral q1800  . sodium chloride  3 mL Intravenous Q12H  . ticagrelor  90 mg Oral BID   . sodium chloride Stopped (11/22/15 0100)    ASSESSMENT AND PLAN:  1. Accelerating angina/ History of CAD - s/p CABG 2004 with LIMA-LAD, SVG-D1, SVG-OM, SVG-PDA, known occlusion of SVG-diagonal, DES to SVG-RCA in 12/2014 - LHC on 11/21/2015 showed 85% stenosis in the distal graft of the SVG-distal RCA. Xience Alpine DES was plcaed with 0% residual stenosis. - Will be on DAPT with ASA and Brilinta. Has enrolled in the Twilight study and will receive Brilinta from the study.  - continue BB, Ranexa, and Imdur.  2. HTN - continue current medication regimen  3. HLD - continue statin  Will follow-up with Dr. Domenic Polite in [place] on 12/06/2015.  Signed, Erma Heritage , PA-C 7:02 AM 11/22/2015 Pager: [phone]  I have examined the patient and reviewed assessment and plan and discussed with  patient.  Agree with above as stated.  Right groin bruised.  2+ right PT pulse.  Changing to Brilinta as part of the Twiilight study.  Was on clopidogrel. Continue aggressive secondary prevention.  Thia Olesen S.

## 2015-11-22 NOTE — Progress Notes (Signed)
CARDIAC REHAB PHASE I   PRE:  Rate/Rhythm: 78 SR  BP:  Supine: 152/80  Sitting:   Standing:    SaO2:   MODE:  Ambulation: 500 ft   POST:  Rate/Rhythm: 86 SR  BP:  Supine:   Sitting: 154/70  Standing:    SaO2:  0805-0900 Pt walked 500 ft with rolling walker with steady gait. Has walker at home to use if needed. No CP. Reviewed ed as I saw pt in February. Stressed importance of brililnta with stent. Reviewed NTG use. Pt had completed CRP 2 in [place] but in agreement to refer back. To recliner after walk. Tolerated well. Encouraged to follow heart healthy diet.    Graylon Good, RN BSN  11/22/2015 8:53 AM

## 2015-11-26 ENCOUNTER — Other Ambulatory Visit: Payer: Self-pay | Admitting: *Deleted

## 2015-11-26 MED ORDER — AMBULATORY NON FORMULARY MEDICATION: 90.0000 mg | Freq: Two times a day (BID) | Status: DC

## 2015-11-26 MED ORDER — AMBULATORY NON FORMULARY MEDICATION: 81.0000 mg | Freq: Every day | Status: DC

## 2015-12-06 ENCOUNTER — Encounter: Payer: Self-pay | Admitting: Cardiology

## 2015-12-06 ENCOUNTER — Encounter: Payer: Medicare Other | Admitting: Cardiology

## 2015-12-06 NOTE — Progress Notes (Signed)
No show  This encounter was created in error - please disregard.

## 2015-12-10 ENCOUNTER — Other Ambulatory Visit: Payer: Self-pay | Admitting: *Deleted

## 2015-12-11 ENCOUNTER — Ambulatory Visit (INDEPENDENT_AMBULATORY_CARE_PROVIDER_SITE_OTHER): Payer: Medicare Other | Admitting: Cardiology

## 2015-12-11 ENCOUNTER — Encounter: Payer: Self-pay | Admitting: Cardiology

## 2015-12-11 VITALS — BP 112/72 | HR 65 | Ht 68.0 in | Wt 220.0 lb

## 2015-12-11 DIAGNOSIS — E782 Mixed hyperlipidemia: Secondary | ICD-10-CM | POA: Diagnosis not present

## 2015-12-11 DIAGNOSIS — I2510 Atherosclerotic heart disease of native coronary artery without angina pectoris: Secondary | ICD-10-CM | POA: Diagnosis not present

## 2015-12-11 DIAGNOSIS — I10 Essential (primary) hypertension: Secondary | ICD-10-CM | POA: Diagnosis not present

## 2015-12-11 NOTE — Patient Instructions (Signed)
Your physician recommends that you continue on your current medications as directed. Please refer to the Current Medication list given to you today.  Your physician recommends that you schedule a follow-up appointment in: 3 months  

## 2015-12-11 NOTE — Progress Notes (Signed)
Cardiology Office Note  Date: 12/11/2015   ID: Harly, Siddall 1932-01-11, MRN [id number]  PCP: Glenda Chroman., MD  Primary Cardiologist: Rozann Lesches, MD   Chief Complaint  Patient presents with  . Coronary Artery Disease  . Follow-up cardiac catheterization    History of Present Illness: Elijah Santana is an 82 y.o. male recently seen in late December 2016 and referred for a cardiac catheterization with progressive angina symptoms on medical therapy. He was found to have significant multivessel CAD as well as graft disease, details are outlined below. He underwent placement of DES to a new site within the SVG to RCA (previously placed stent was patent). He has been enrolled in the TWILIGHT study, and he was also placed on Ranexa in addition to his remaining medications.  He presents for a follow-up visit. States that he has had no subsequent angina symptoms on current medical regimen. Overall he would like to simplify his medicines if possible, concerned about the high cost particularly of Ranexa. We discussed his returning to a regular exercise plan, he was previously in the maintenance program at cardiac rehabilitation.  Current medications reviewed as outlined below, he reports no bleeding problems at this time.  Past Medical History  Diagnosis Date  . Coronary atherosclerosis of native coronary artery     a. CABG 2004 with LIMA-LAD, SVG-D1, SVG-OM, SVG-PDA. b. Cath ~2010 with occ of SVG-diagonal, distal LAD and OM filiing by collaterals. c. NSTEMI 12/2014 s/p DES to SVG-RCA. d. 11/2015: DES to distal graft of the SVG-distal RCA  . Chronic back pain   . Mixed hyperlipidemia   . Essential hypertension   . Myocardial infarction ([place])   . Prostate cancer [facility])     Radiation therapy 1996  . NSTEMI (non-ST elevated myocardial infarction) ([place]) 01/01/15  . Asthma     Childhood  . Hyperglycemia   . Ischemic cardiomyopathy     a. EF 45% by cath 12/2014.    Current Outpatient  Prescriptions  Medication Sig Dispense Refill  . alfuzosin (UROXATRAL) 10 MG 24 hr tablet Take 10 mg by mouth daily.    . AMBULATORY NON FORMULARY MEDICATION Take 90 mg by mouth 2 (two) times daily. Medication Name: Brilinta 90 mg BID (TWILIGHT Research Study PROVIDED)    . AMBULATORY NON FORMULARY MEDICATION Take 81 mg by mouth daily. Medication Name: ASA 81 mg Daily (TWILIGHT Research Study PROVIDED)    . amLODipine (NORVASC) 5 MG tablet Take 5 mg by mouth daily.    . bisacodyl (DULCOLAX) 5 MG EC tablet Take 5-10 mg by mouth daily as needed (constipation).     Marland Kitchen doxazosin (CARDURA) 4 MG tablet Take 4 mg by mouth daily.     . isosorbide mononitrate (IMDUR) 60 MG 24 hr tablet Take 1 tablet (60 mg total) by mouth every morning. 30 tablet 6  . metoprolol (LOPRESSOR) 50 MG tablet Take 50 mg by mouth 2 (two) times daily.    . nitroGLYCERIN (NITROSTAT) 0.4 MG SL tablet PLACE ONE (1) TABLET UNDER TONGUE EVERY 5 MINUTES UP TO (3) DOSES AS NEEDED FOR CHEST PAIN. 25 tablet 3  . pantoprazole (PROTONIX) 40 MG tablet Take 1 tablet (40 mg total) by mouth daily. (Patient taking differently: Take 40 mg by mouth at bedtime. ) 90 tablet 3  . PRESCRIPTION MEDICATION Hormone shots done at Dr. Ralene Muskrat office once every 4 months (for prostrate) - last injection mid December 2016    . ranolazine (RANEXA) 500 MG 12  hr tablet Take 1 tablet (500 mg total) by mouth 2 (two) times daily. 84 tablet 0  . simvastatin (ZOCOR) 40 MG tablet TAKE 1 BY MOUTH AT BEDTIME (Patient taking differently: TAKE 1 TABLET BY MOUTH AT BEDTIME) 90 tablet 3   No current facility-administered medications for this visit.   Allergies:  Review of patient's allergies indicates no known allergies.   Social History: The patient  reports that he has never smoked. He quit smokeless tobacco use about 5 years ago. His smokeless tobacco use included Chew. He reports that he does not drink alcohol or use illicit drugs.   ROS:  Please see the history of  present illness. Otherwise, complete review of systems is positive for arthritis symptoms.  All other systems are reviewed and negative.   Physical Exam: VS:  BP 112/72 mmHg  Pulse 65  Ht 5\' 8"  (1.727 m)  Wt 220 lb (99.791 kg)  BMI 33.46 kg/m2  SpO2 96%, BMI Body mass index is 33.46 kg/(m^2).  Wt Readings from Last 3 Encounters:  12/11/15 220 lb (99.791 kg)  11/22/15 226 lb 3.1 oz (102.6 kg)  11/15/15 224 lb 9.6 oz (101.878 kg)    General: Patient appears comfortable at rest. HEENT: Conjunctiva and lids normal, oropharynx clear with moist mucosa. Neck: Supple, no elevated JVP or carotid bruits, no thyromegaly. Lungs: Clear to auscultation, nonlabored breathing at rest. Cardiac: Regular rate and rhythm, no S3 or significant systolic murmur, no pericardial rub. Abdomen: Soft, nontender, no hepatomegaly, bowel sounds present, no guarding or rebound. Extremities: No pitting edema, distal pulses 2+.  ECG: I reviewed his tracing from 11/22/2015 which showed sinus rhythm with prolonged PR interval, decreased R wave progression.  Recent Labwork: 12/31/2014: TSH 3.605 01/01/2015: ALT 23; AST 22 11/22/2015: BUN 13; Creatinine, Ser 0.93; Hemoglobin 12.1*; Platelets 171; Potassium 3.6; Sodium 142     Component Value Date/Time   CHOL 135 01/01/2015 0456   TRIG 168* 01/01/2015 0456   HDL 39* 01/01/2015 0456   CHOLHDL 3.5 01/01/2015 0456   VLDL 34 01/01/2015 0456   LDLCALC 62 01/01/2015 0456    Other Studies Reviewed Today:  Cardiac catheterization 11/21/2015:  Ramus lesion, 99% stenosed.  Ost LAD lesion, 99% stenosed.  Prox LAD lesion, 100% stenosed.  Prox Cx to Mid Cx lesion, 100% stenosed.  Prox RCA lesion, 100% stenosed.  Ost RCA lesion, 99% stenosed.  SVG was injected is normal in caliber.  There is severe disease in the graft.  SVG was injected is normal in caliber, and is anatomically normal.  SVG was injected .  There is severe disease in the graft.  Origin lesion,  100% stenosed.  LIMA was injected is normal in caliber, and is anatomically normal.  Dist LAD lesion, 100% stenosed.  There is mild left ventricular systolic dysfunction.  Dist Graft lesion, 85% stenosed. Post intervention, there is a 0% residual stenosis.  Mild LV dysfunction with a focal area of severe hypo-to akinesis involving the mid anterolateral wall. Global ejection fraction at 45 to less than 50%. LVEDP 22 mm Hg.  Significant native CAD with a 99% ostial LAD stenosis followed by total occlusion of the proximal LAD; 99% proximal stenosis of a ramus immediate vessel; total occlusion of the circumflex following the first OM branch with faint distal filling of a second marginal branch and total occlusion of the proximal RCA.   Patent vein graft supplying the distal circumflex marginal vessel which then collateralizes the distal LAD and diagonal vessel.  Old occluded  vein graft, which had supplied the diagonal vessel.  SVG supplying the distal RCA with previously placed proximal to mid stent that is widely patent, but evidence for a new 85% distal stenosis prior to the distal anastamosis.  Patent LIMA graft supplying the mid LAD and filling the distal LAD ending proximal to the apex. The distal LAD is occluded and there are faint apical LAD collaterals.  Successful percutaneous coronary intervention to the distal SVG 85% stenosis with ultimate insertion of a 2.512 mm Xience Alpine DES stent postdilated to 2.63 mm with the stenosis being reduced to 0%.  Assessment and Plan:  1. Symptomatically stable multivessel CAD status post CABG with subsequent percutaneous interventions, most recently DES to the distal SVG to RCA. He is enrolled in the TWILIGHT study. Continue current medical regimen for now and resume prior exercise plan. If his angina is adequately controlled, we might try dropping the Ranexa.  2. Essential hypertension, blood pressure is well controlled today.  3.  Hyperlipidemia, continues on Zocor. He has had good LDL control over time.  Current medicines were reviewed with the patient today.  Disposition: FU with me in 3 months.   Signed, Satira Sark, MD, [facility] 12/11/2015 4:30 PM    [facility] [address], [place], [place] [address] Phone: [phone]; Fax: [phone]

## 2015-12-13 ENCOUNTER — Other Ambulatory Visit: Payer: Self-pay | Admitting: *Deleted

## 2015-12-19 ENCOUNTER — Encounter: Payer: Self-pay | Admitting: *Deleted

## 2015-12-19 DIAGNOSIS — Z006 Encounter for examination for normal comparison and control in clinical research program: Secondary | ICD-10-CM

## 2015-12-19 NOTE — Progress Notes (Signed)
TWILIGHT Research 1 month telephone follow up completed. Patient denies any adverse or bleeding events. States compliant with study medication(may have missed 1 evening pill) questions encouraged and answered. Next research visit 02/13/16 @ 11:30.

## 2015-12-24 ENCOUNTER — Other Ambulatory Visit: Payer: Self-pay | Admitting: Cardiology

## 2015-12-24 ENCOUNTER — Other Ambulatory Visit: Payer: Self-pay | Admitting: *Deleted

## 2015-12-24 MED ORDER — ISOSORBIDE MONONITRATE ER 60 MG PO TB24: 60.0000 mg | ORAL_TABLET | Freq: Every morning | ORAL | Status: DC

## 2015-12-24 NOTE — Telephone Encounter (Signed)
[name] needs refill on Imdur 60 mg. -[facility]  [name] would like a call back.  States that he has tried to have refilled.  [phone]

## 2016-01-13 ENCOUNTER — Telehealth: Payer: Self-pay | Admitting: Cardiology

## 2016-01-13 MED ORDER — RANOLAZINE ER 500 MG PO TB12
500.0000 mg | ORAL_TABLET | Freq: Two times a day (BID) | ORAL | Status: DC
Start: 2016-01-13 — End: 2016-05-18

## 2016-01-13 NOTE — Telephone Encounter (Signed)
Has [employer] now Please send 90 day script for ranolazine (RANEXA) 500 MG 12 hr tablet

## 2016-01-13 NOTE — Telephone Encounter (Signed)
Done. Patient notified.

## 2016-01-17 ENCOUNTER — Other Ambulatory Visit: Payer: Self-pay | Admitting: *Deleted

## 2016-01-17 MED ORDER — AMLODIPINE BESYLATE 5 MG PO TABS
5.0000 mg | ORAL_TABLET | Freq: Every day | ORAL | Status: DC
Start: 2016-01-17 — End: 2016-09-12

## 2016-02-13 ENCOUNTER — Other Ambulatory Visit: Payer: Self-pay | Admitting: *Deleted

## 2016-02-13 ENCOUNTER — Encounter: Payer: Self-pay | Admitting: *Deleted

## 2016-02-13 DIAGNOSIS — Z006 Encounter for examination for normal comparison and control in clinical research program: Secondary | ICD-10-CM

## 2016-02-13 MED ORDER — AMBULATORY NON FORMULARY MEDICATION: 81.0000 mg | Freq: Every day | Status: DC

## 2016-02-13 NOTE — Progress Notes (Signed)
TWILIGHT Research study month 3 randomization visit completed. Patient denies any adverse events or bleeding events. Compliant with medication, "may have missed 1 or 2 pills". Patient was Randomized to ASA 81 mg or PLACEBO today. Instructions given to not take any open label ASA other than what is supplied from [employer] study. Verbalized understanding. Questions encouraged and answered.

## 2016-03-06 ENCOUNTER — Other Ambulatory Visit: Payer: Self-pay | Admitting: Urology

## 2016-03-06 ENCOUNTER — Ambulatory Visit (INDEPENDENT_AMBULATORY_CARE_PROVIDER_SITE_OTHER): Payer: Medicare Other | Admitting: Urology

## 2016-03-06 DIAGNOSIS — C61 Malignant neoplasm of prostate: Secondary | ICD-10-CM

## 2016-03-06 DIAGNOSIS — R351 Nocturia: Secondary | ICD-10-CM

## 2016-03-06 DIAGNOSIS — N401 Enlarged prostate with lower urinary tract symptoms: Secondary | ICD-10-CM | POA: Diagnosis not present

## 2016-03-12 ENCOUNTER — Encounter: Payer: Self-pay | Admitting: Cardiology

## 2016-03-12 ENCOUNTER — Ambulatory Visit (INDEPENDENT_AMBULATORY_CARE_PROVIDER_SITE_OTHER): Payer: Medicare Other | Admitting: Cardiology

## 2016-03-12 VITALS — BP 130/78 | HR 56 | Ht 68.0 in | Wt 221.0 lb

## 2016-03-12 DIAGNOSIS — I10 Essential (primary) hypertension: Secondary | ICD-10-CM | POA: Diagnosis not present

## 2016-03-12 DIAGNOSIS — I255 Ischemic cardiomyopathy: Secondary | ICD-10-CM

## 2016-03-12 DIAGNOSIS — K219 Gastro-esophageal reflux disease without esophagitis: Secondary | ICD-10-CM

## 2016-03-12 DIAGNOSIS — I2510 Atherosclerotic heart disease of native coronary artery without angina pectoris: Secondary | ICD-10-CM | POA: Diagnosis not present

## 2016-03-12 DIAGNOSIS — E782 Mixed hyperlipidemia: Secondary | ICD-10-CM | POA: Diagnosis not present

## 2016-03-12 MED ORDER — PANTOPRAZOLE SODIUM 40 MG PO TBEC: 40.0000 mg | DELAYED_RELEASE_TABLET | Freq: Every day | ORAL | Status: DC

## 2016-03-12 NOTE — Patient Instructions (Signed)
Your physician recommends that you continue on your current medications as directed. Please refer to the Current Medication list given to you today.  Start protonix 40 mg daily. Your physician recommends that you schedule a follow-up appointment in: 6 months. You will receive a reminder letter in the mail in about 4 months reminding you to call and schedule your appointment. If you don't receive this letter, please contact our office.

## 2016-03-12 NOTE — Progress Notes (Signed)
Cardiology Office Note  Date: 03/12/2016   ID: Juluis, Stratmann [birth year], MRN [id number]  PCP: Glenda Chroman, MD  Primary Cardiologist: Rozann Lesches, MD   Chief Complaint  Patient presents with  . Coronary Artery Disease    History of Present Illness: Elijah Santana is an 82 y.o. male last seen in January. He presents for a routine follow-up visit. He has had no functional decline since last encounter. Reports no nitroglycerin use. He has had worsening reflux symptoms, has been using Tums and other over-the-counter antacids. He was previously on Protonix.  He continues to follow in the TWILIGHT research study.  I reviewed his medications. He is on Zocor, Ranexa, Lopressor, Imdur, Norvasc, and study drug.  He reports NYHA class II dyspnea. No significant change in weight. No orthopnea or PND. LVEF was 45-50% at cardiac catheterization.  Past Medical History  Diagnosis Date  . Coronary atherosclerosis of native coronary artery     a. CABG 2004 with LIMA-LAD, SVG-D1, SVG-OM, SVG-PDA. b. Cath ~2010 with occ of SVG-diagonal, distal LAD and OM filiing by collaterals. c. NSTEMI 12/2014 s/p DES to SVG-RCA. d. 11/2015: DES to distal graft of the SVG-distal RCA  . Chronic back pain   . Mixed hyperlipidemia   . Essential hypertension   . Myocardial infarction ([place])   . Prostate cancer [facility])     Radiation therapy 1996  . NSTEMI (non-ST elevated myocardial infarction) ([place]) 01/01/15  . Asthma     Childhood  . Hyperglycemia   . Ischemic cardiomyopathy     a. EF 45% by cath 12/2014.    Past Surgical History  Procedure Laterality Date  . Coronary artery bypass graft  2004    LIMA to LAD, SVG to diagonal, SVG to circumflex, SVG to PDA  . Left heart catheterization with coronary/graft angiogram N/A 01/01/2015    Procedure: LEFT HEART CATHETERIZATION WITH Beatrix Fetters;  Surgeon: Peter M Martinique, MD;  Location: [facility] CATH LAB;  Service: Cardiovascular;  Laterality: N/A;  .  Percutaneous coronary stent intervention (pci-s)  11/20/2015    distal SVG  with DES       . Cardiac catheterization  01/01/2015    Procedure: CORONARY STENT INTERVENTION;  Surgeon: Peter M Martinique, MD;  Location[facility] CATH LAB;  Service: Cardiovascular;;  SVG to PDA  . Cardiac catheterization N/A 11/21/2015    Procedure: Left Heart Cath and Cors/Grafts Angiography;  Surgeon: Troy Sine, MD;  Location: [place] CV LAB;  Service: Cardiovascular;  Laterality: N/A;  . Cardiac catheterization N/A 11/21/2015    Procedure: Coronary Stent Intervention;  Surgeon: Troy Sine, MD;  Location: [place] CV LAB;  Service: Cardiovascular;  Laterality: N/A;  . Tonsillectomy      Current Outpatient Prescriptions  Medication Sig Dispense Refill  . alfuzosin (UROXATRAL) 10 MG 24 hr tablet Take 10 mg by mouth daily.    . AMBULATORY NON FORMULARY MEDICATION Take 90 mg by mouth 2 (two) times daily. Medication Name: Brilinta 90 mg BID (TWILIGHT Research Study PROVIDED)    . AMBULATORY NON FORMULARY MEDICATION Take 81 mg by mouth daily. Medication Name: ASA 81 mg Daily OR PLACEBO (TWILIGHT Research study provided)    . amLODipine (NORVASC) 5 MG tablet Take 1 tablet (5 mg total) by mouth daily. 90 tablet 3  . bisacodyl (DULCOLAX) 5 MG EC tablet Take 5-10 mg by mouth daily as needed (constipation).     Marland Kitchen doxazosin (CARDURA) 4 MG tablet Take 4 mg  by mouth daily.     . isosorbide mononitrate (IMDUR) 60 MG 24 hr tablet Take 1 tablet (60 mg total) by mouth every morning. 15 tablet 0  . metoprolol (LOPRESSOR) 50 MG tablet Take 50 mg by mouth 2 (two) times daily.    . nitroGLYCERIN (NITROSTAT) 0.4 MG SL tablet PLACE ONE (1) TABLET UNDER TONGUE EVERY 5 MINUTES UP TO (3) DOSES AS NEEDED FOR CHEST PAIN. 25 tablet 3  . PRESCRIPTION MEDICATION Hormone shots done at Dr. Ralene Muskrat office once every 4 months (for prostrate) - last injection mid December 2016    . ranolazine (RANEXA) 500 MG 12 hr tablet Take 1 tablet (500 mg  total) by mouth 2 (two) times daily. 180 tablet 3  . simvastatin (ZOCOR) 40 MG tablet TAKE 1 BY MOUTH AT BEDTIME (Patient taking differently: TAKE 1 TABLET BY MOUTH AT BEDTIME) 90 tablet 3  . pantoprazole (PROTONIX) 40 MG tablet Take 1 tablet (40 mg total) by mouth daily. 90 tablet 3   No current facility-administered medications for this visit.   Allergies:  Review of patient's allergies indicates no known allergies.   Social History: The patient  reports that he has never smoked. He quit smokeless tobacco use about 5 years ago. His smokeless tobacco use included Chew. He reports that he does not drink alcohol or use illicit drugs.   ROS:  Please see the history of present illness. Otherwise, complete review of systems is positive for arthritis pains, decreased hearing.  All other systems are reviewed and negative.   Physical Exam: VS:  BP 130/78 mmHg  Pulse 56  Ht 5\' 8"  (1.727 m)  Wt 221 lb (100.245 kg)  BMI 33.61 kg/m2  SpO2 99%, BMI Body mass index is 33.61 kg/(m^2).  Wt Readings from Last 3 Encounters:  03/12/16 221 lb (100.245 kg)  12/11/15 220 lb (99.791 kg)  11/22/15 226 lb 3.1 oz (102.6 kg)    General: Patient appears comfortable at rest. HEENT: Conjunctiva and lids normal, oropharynx clear with moist mucosa. Neck: Supple, no elevated JVP or carotid bruits, no thyromegaly. Lungs: Clear to auscultation, nonlabored breathing at rest. Cardiac: Regular rate and rhythm, no S3 or significant systolic murmur, no pericardial rub. Abdomen: Soft, nontender, no hepatomegaly, bowel sounds present, no guarding or rebound. Extremities: No pitting edema, distal pulses 2+. Skin: Warm and dry. Musculoskeletal: No kyphosis. Neuropsychiatric: Alert and oriented 3, affect appropriate.  ECG: I personally reviewed the prior tracing from 11/22/2015 which showed sinus rhythm with prolonged PR interval, decreased R wave progression.  Recent Labwork: 11/22/2015: BUN 13; Creatinine, Ser 0.93;  Hemoglobin 12.1*; Platelets 171; Potassium 3.6; Sodium 142     Component Value Date/Time   CHOL 135 01/01/2015 0456   TRIG 168* 01/01/2015 0456   HDL 39* 01/01/2015 0456   CHOLHDL 3.5 01/01/2015 0456   VLDL 34 01/01/2015 0456   LDLCALC 62 01/01/2015 0456    Other Studies Reviewed Today:  Cardiac catheterization 11/21/2015:  Ramus lesion, 99% stenosed.  Ost LAD lesion, 99% stenosed.  Prox LAD lesion, 100% stenosed.  Prox Cx to Mid Cx lesion, 100% stenosed.  Prox RCA lesion, 100% stenosed.  Ost RCA lesion, 99% stenosed.  SVG was injected is normal in caliber.  There is severe disease in the graft.  SVG was injected is normal in caliber, and is anatomically normal.  SVG was injected .  There is severe disease in the graft.  Origin lesion, 100% stenosed.  LIMA was injected is normal in caliber, and is  anatomically normal.  Dist LAD lesion, 100% stenosed.  There is mild left ventricular systolic dysfunction.  Dist Graft lesion, 85% stenosed. Post intervention, there is a 0% residual stenosis.  Mild LV dysfunction with a focal area of severe hypo-to akinesis involving the mid anterolateral wall. Global ejection fraction at 45 to less than 50%. LVEDP 22 mm Hg.  Significant native CAD with a 99% ostial LAD stenosis followed by total occlusion of the proximal LAD; 99% proximal stenosis of a ramus immediate vessel; total occlusion of the circumflex following the first OM branch with faint distal filling of a second marginal branch and total occlusion of the proximal RCA.   Patent vein graft supplying the distal circumflex marginal vessel which then collateralizes the distal LAD and diagonal vessel.  Old occluded vein graft, which had supplied the diagonal vessel.  SVG supplying the distal RCA with previously placed proximal to mid stent that is widely patent, but evidence for a new 85% distal stenosis prior to the distal anastamosis.  Patent LIMA graft supplying the  mid LAD and filling the distal LAD ending proximal to the apex. The distal LAD is occluded and there are faint apical LAD collaterals.  Successful percutaneous coronary intervention to the distal SVG 85% stenosis with ultimate insertion of a 2.512 mm Xience Alpine DES stent postdilated to 2.63 mm with the stenosis being reduced to 0%.  Assessment and Plan:  1. Multivessel CAD status post CABG with graft disease and most recently DES intervention to the distal vein graft to the RCA. He continues in the TWILIGHT study and on medical therapy. No changes were made today.  2. Progressive reflux symptoms. Resuming Protonix as before.  3. Essential hypertension, blood pressure control is adequate today.  4. Hyperlipidemia, on Zocor. LDL has been well controlled.  5. Cardiomyopathy with LVEF 45-50% at last angiography. No active heart failure symptoms.  Current medicines were reviewed with the patient today.  Disposition: FU with me in 6 months.   Signed, Satira Sark, MD, [facility] 03/12/2016 9:48 AM    [facility] [place], [place], [place] [address] Phone: [phone]; Fax: [phone]

## 2016-03-13 ENCOUNTER — Other Ambulatory Visit: Payer: Self-pay | Admitting: *Deleted

## 2016-03-13 ENCOUNTER — Telehealth: Payer: Self-pay | Admitting: *Deleted

## 2016-03-13 DIAGNOSIS — Z5181 Encounter for therapeutic drug level monitoring: Secondary | ICD-10-CM

## 2016-03-13 DIAGNOSIS — E782 Mixed hyperlipidemia: Secondary | ICD-10-CM

## 2016-03-13 DIAGNOSIS — I2510 Atherosclerotic heart disease of native coronary artery without angina pectoris: Secondary | ICD-10-CM

## 2016-03-13 MED ORDER — METOPROLOL TARTRATE 50 MG PO TABS: 50.0000 mg | ORAL_TABLET | Freq: Two times a day (BID) | ORAL | Status: DC

## 2016-03-13 MED ORDER — SIMVASTATIN 40 MG PO TABS: 40.0000 mg | ORAL_TABLET | Freq: Every day | ORAL | Status: DC

## 2016-03-20 ENCOUNTER — Encounter: Payer: Self-pay | Admitting: *Deleted

## 2016-03-20 MED ORDER — SIMVASTATIN 10 MG PO TABS: 10.0000 mg | ORAL_TABLET | Freq: Every day | ORAL | Status: DC

## 2016-03-20 NOTE — Telephone Encounter (Signed)
Noted, aware of guidelines. Actually, he has tolerated Zocor and Norvasc at current doses for quite some time without any significant side effects. With the recent addition of Ranexa however, would be reasonable to cut his Zocor dose back to 10 mg daily. We will see how his lipids are controlled on this regimen, recheck FLP and LFTs in 8 weeks.

## 2016-03-20 NOTE — Telephone Encounter (Signed)
Patient informed and verbalized understanding of plan. 

## 2016-03-20 NOTE — Telephone Encounter (Signed)
When patient was in the office, he wasn't sure if he was taking amlodipine. Patient confirmed that he is taking amlodipine 5 mg.  On 03/13/16, [employer] sent notification for FDA guideline recommendations not to exceed doses of simvastatin above 20 mg when taking these combination of medications:amlodipine 5 mg, simvastatin 40 mg and ranexa 500 mg. Please advise if you wish for patient to continue current treatment or if you want to make changes based on the recommended FDA guidelines.

## 2016-03-23 ENCOUNTER — Encounter: Payer: Self-pay | Admitting: *Deleted

## 2016-03-23 DIAGNOSIS — Z006 Encounter for examination for normal comparison and control in clinical research program: Secondary | ICD-10-CM

## 2016-03-23 NOTE — Progress Notes (Signed)
TWILIGHT Research study month 4 telephone call completed. Patients denies any bleeding or adverse events. States he has been compliant with medication. Questions encouraged and answered.

## 2016-03-25 ENCOUNTER — Encounter (HOSPITAL_COMMUNITY)
Admission: RE | Admit: 2016-03-25 | Discharge: 2016-03-25 | Disposition: A | Discharge status: Home / Self Care | Payer: Medicare Other | Source: Ambulatory Visit | Attending: Urology | Admitting: Urology

## 2016-03-25 ENCOUNTER — Encounter (HOSPITAL_COMMUNITY): Payer: Self-pay

## 2016-03-25 DIAGNOSIS — N329 Bladder disorder, unspecified: Secondary | ICD-10-CM | POA: Diagnosis not present

## 2016-03-25 DIAGNOSIS — C61 Malignant neoplasm of prostate: Secondary | ICD-10-CM | POA: Diagnosis not present

## 2016-03-25 MED ORDER — TECHNETIUM TC 99M MEDRONATE IV KIT
25.0000 | PACK | Freq: Once | INTRAVENOUS | Status: AC | PRN
  Administered 2016-03-25: 25 via INTRAVENOUS

## 2016-03-27 ENCOUNTER — Ambulatory Visit (INDEPENDENT_AMBULATORY_CARE_PROVIDER_SITE_OTHER): Payer: Medicare Other | Admitting: Urology

## 2016-03-27 DIAGNOSIS — C61 Malignant neoplasm of prostate: Secondary | ICD-10-CM | POA: Diagnosis not present

## 2016-03-27 DIAGNOSIS — R351 Nocturia: Secondary | ICD-10-CM

## 2016-04-01 ENCOUNTER — Ambulatory Visit (HOSPITAL_COMMUNITY)
Admission: RE | Admit: 2016-04-01 | Discharge: 2016-04-01 | Disposition: A | Discharge status: Home / Self Care | Payer: Medicare Other | Source: Ambulatory Visit | Attending: Urology | Admitting: Urology

## 2016-04-01 DIAGNOSIS — I2510 Atherosclerotic heart disease of native coronary artery without angina pectoris: Secondary | ICD-10-CM | POA: Insufficient documentation

## 2016-04-01 DIAGNOSIS — K449 Diaphragmatic hernia without obstruction or gangrene: Secondary | ICD-10-CM | POA: Diagnosis not present

## 2016-04-01 DIAGNOSIS — N1330 Unspecified hydronephrosis: Secondary | ICD-10-CM | POA: Diagnosis not present

## 2016-04-01 DIAGNOSIS — K5730 Diverticulosis of large intestine without perforation or abscess without bleeding: Secondary | ICD-10-CM | POA: Insufficient documentation

## 2016-04-01 DIAGNOSIS — C61 Malignant neoplasm of prostate: Secondary | ICD-10-CM | POA: Insufficient documentation

## 2016-04-01 DIAGNOSIS — M5136 Other intervertebral disc degeneration, lumbar region: Secondary | ICD-10-CM | POA: Diagnosis not present

## 2016-04-01 DIAGNOSIS — I700 Atherosclerosis of aorta: Secondary | ICD-10-CM | POA: Diagnosis not present

## 2016-04-01 LAB — POCT I-STAT CREATININE: Creatinine, Ser: 1 mg/dL (ref 0.61–1.24)

## 2016-04-01 MED ORDER — IOPAMIDOL (ISOVUE-300) INJECTION 61%
100.0000 mL | Freq: Once | INTRAVENOUS | Status: AC | PRN
  Administered 2016-04-01: 100 mL via INTRAVENOUS

## 2016-04-10 ENCOUNTER — Ambulatory Visit (INDEPENDENT_AMBULATORY_CARE_PROVIDER_SITE_OTHER): Payer: Medicare Other | Admitting: Urology

## 2016-04-10 DIAGNOSIS — N1330 Unspecified hydronephrosis: Secondary | ICD-10-CM

## 2016-04-10 DIAGNOSIS — C61 Malignant neoplasm of prostate: Secondary | ICD-10-CM

## 2016-04-10 DIAGNOSIS — R338 Other retention of urine: Secondary | ICD-10-CM | POA: Diagnosis not present

## 2016-04-14 ENCOUNTER — Telehealth: Payer: Self-pay | Admitting: Cardiovascular Disease

## 2016-04-14 NOTE — Telephone Encounter (Signed)
Information faxed to [place] at Dr. Ralene Muskrat office and Reuben Likes the RN for the twilight study program.

## 2016-04-14 NOTE — Telephone Encounter (Signed)
Fwd to Dr. Domenic Polite for recommendations.

## 2016-04-14 NOTE — Telephone Encounter (Signed)
Noted. Most recent DES intervention to the SVG to RCA was in January of this year. Ideally, would try to get at least 6 full months of treatment on dual antiplatelet therapy prior to considering temporary hold of Brilinta. Patient is also a participant in the Twilight study. Please check with study coordinator in the research division to see how this would be handled.

## 2016-04-14 NOTE — Telephone Encounter (Signed)
New message     Request for surgical clearance:  1. What type of surgery is being performed? Green light laser prostatectomy  2. When is this surgery scheduled? pending  3. Are there any medications that need to be held prior to surgery and how long? Brilinta ho long to be off medication  4. Name of physician performing surgery? DR. Wrenn  5. What is your office phone and fax number? Fax number [phone] office number [phone]

## 2016-04-20 NOTE — Telephone Encounter (Signed)
This is the information that was returned to me as follows:  "Per TWILIGHT Protocol its up to the Physician. Please keep in mind that the patient was randomized 02/03/16 to ASA 81 mg Or PLACEBO along with Brilinta 90 mg BID. It is a blinded trial so we do not know if he is on ASA or placebo. If you have any questions please call research office [phone]."  If the patient's anticipated prostate procedure is felt to be needed in the relative short-term to help with symptoms and improve his quality of life, I would at least let him achieve 6 full months of treatment with Brilinta prior to discontinuing temporarily. This would mean that he would need to wait until after July 5. If he remains stable from a cardiac perspective around that time, he would need to hold Brilinta for at least 5 days prior to the procedure, and then start it back as soon as possible thereafter.

## 2016-04-21 NOTE — Telephone Encounter (Signed)
Information faxed to Dr. Ralene Muskrat office to [facility] attention.

## 2016-05-08 ENCOUNTER — Other Ambulatory Visit: Payer: Self-pay | Admitting: Urology

## 2016-05-08 ENCOUNTER — Ambulatory Visit (INDEPENDENT_AMBULATORY_CARE_PROVIDER_SITE_OTHER): Payer: Medicare Other | Admitting: Urology

## 2016-05-08 ENCOUNTER — Other Ambulatory Visit (HOSPITAL_COMMUNITY)
Admission: RE | Admit: 2016-05-08 | Discharge: 2016-05-08 | Disposition: A | Discharge status: Home / Self Care | Payer: Medicare Other | Source: Skilled Nursing Facility | Attending: Urology | Admitting: Urology

## 2016-05-08 DIAGNOSIS — C61 Malignant neoplasm of prostate: Secondary | ICD-10-CM

## 2016-05-08 DIAGNOSIS — N401 Enlarged prostate with lower urinary tract symptoms: Secondary | ICD-10-CM

## 2016-05-08 DIAGNOSIS — N400 Enlarged prostate without lower urinary tract symptoms: Secondary | ICD-10-CM | POA: Diagnosis present

## 2016-05-08 DIAGNOSIS — R338 Other retention of urine: Secondary | ICD-10-CM | POA: Diagnosis not present

## 2016-05-08 LAB — URINALYSIS, ROUTINE W REFLEX MICROSCOPIC
BILIRUBIN URINE: NEGATIVE
Glucose, UA: NEGATIVE mg/dL
Hgb urine dipstick: NEGATIVE
KETONES UR: NEGATIVE mg/dL
Leukocytes, UA: NEGATIVE
NITRITE: NEGATIVE
PROTEIN: NEGATIVE mg/dL
SPECIFIC GRAVITY, URINE: 1.01 (ref 1.005–1.030)
pH: 6 (ref 5.0–8.0)

## 2016-05-12 ENCOUNTER — Telehealth: Payer: Self-pay | Admitting: *Deleted

## 2016-05-12 NOTE — Telephone Encounter (Signed)
Received message to call patient because he is having surgery 06/04/16 and wanted to inform research staff. Instructed patient to write down the day he stops and restarts study provided medication. He said that he has already been instructed to stop 5 days prior. Encouraged him to call back if he had any questions.

## 2016-05-18 ENCOUNTER — Telehealth: Payer: Self-pay | Admitting: Cardiology

## 2016-05-18 MED ORDER — RANOLAZINE ER 500 MG PO TB12: 500.0000 mg | ORAL_TABLET | Freq: Two times a day (BID) | ORAL | Status: DC

## 2016-05-18 NOTE — Telephone Encounter (Signed)
Patient called requesting to speak to someone about his  Medications.

## 2016-05-18 NOTE — Telephone Encounter (Signed)
Cardura is one of his prostate medications. We had it listed at 4 mg daily, although he ought to check with his PCP or urologist about the dose.

## 2016-05-18 NOTE — Telephone Encounter (Signed)
Wife Elijah Santana) notified.  Patient also walked into office & was notified as well.

## 2016-05-18 NOTE — Telephone Encounter (Signed)
Pt wanted to confirm what dose of Cardura he should be taking. Per LOV dose is 4 mg qd. Pt says he has gotten 2mg  and 4 mg bottles and would like to confirm dose with Dr. Domenic Polite. Also requesting samples of Ranexa  - pt will come by to pick these up today. Will forward to provider on dose of Cardura

## 2016-05-28 ENCOUNTER — Encounter (HOSPITAL_COMMUNITY): Payer: Self-pay

## 2016-05-28 ENCOUNTER — Encounter (HOSPITAL_COMMUNITY)
Admission: RE | Admit: 2016-05-28 | Discharge: 2016-05-28 | Disposition: A | Discharge status: Home / Self Care | Payer: Medicare Other | Source: Ambulatory Visit | Attending: Urology | Admitting: Urology

## 2016-05-28 DIAGNOSIS — Z01812 Encounter for preprocedural laboratory examination: Secondary | ICD-10-CM | POA: Diagnosis not present

## 2016-05-28 HISTORY — DX: Unspecified osteoarthritis, unspecified site: M19.90

## 2016-05-28 HISTORY — DX: Presence of urogenital implants: Z96.0

## 2016-05-28 HISTORY — DX: Unspecified malignant neoplasm of skin, unspecified: C44.90

## 2016-05-28 HISTORY — DX: Presence of other specified devices: Z97.8

## 2016-05-28 HISTORY — DX: Gastro-esophageal reflux disease without esophagitis: K21.9

## 2016-05-28 HISTORY — DX: Unspecified hearing loss, unspecified ear: H91.90

## 2016-05-28 HISTORY — DX: Cerebral infarction, unspecified: I63.9

## 2016-05-28 HISTORY — DX: Pneumonia, unspecified organism: J18.9

## 2016-05-28 LAB — PROTIME-INR
INR: 1.15 (ref 0.00–1.49)
Prothrombin Time: 14.4 seconds (ref 11.6–15.2)

## 2016-05-28 LAB — CBC
HEMATOCRIT: 38.7 % — AB (ref 39.0–52.0)
Hemoglobin: 12.7 g/dL — ABNORMAL LOW (ref 13.0–17.0)
MCH: 30.1 pg (ref 26.0–34.0)
MCHC: 32.8 g/dL (ref 30.0–36.0)
MCV: 91.7 fL (ref 78.0–100.0)
Platelets: 239 10*3/uL (ref 150–400)
RBC: 4.22 MIL/uL (ref 4.22–5.81)
RDW: 13.9 % (ref 11.5–15.5)
WBC: 6.3 10*3/uL (ref 4.0–10.5)

## 2016-05-28 LAB — BASIC METABOLIC PANEL
Anion gap: 6 (ref 5–15)
BUN: 21 mg/dL — ABNORMAL HIGH (ref 6–20)
CHLORIDE: 108 mmol/L (ref 101–111)
CO2: 25 mmol/L (ref 22–32)
CREATININE: 1.11 mg/dL (ref 0.61–1.24)
Calcium: 9.2 mg/dL (ref 8.9–10.3)
GFR calc non Af Amer: >60 mL/min (ref >60)
Glucose, Bld: 112 mg/dL — ABNORMAL HIGH (ref 65–99)
POTASSIUM: 4.3 mmol/L (ref 3.5–5.1)
SODIUM: 139 mmol/L (ref 135–145)

## 2016-05-28 NOTE — Progress Notes (Signed)
Since I have not heard back from Darrel Reach at [facility] regarding earlier message regarding when patient is to stop Brillinta I left another message for her at [phone] [phone].

## 2016-05-28 NOTE — Progress Notes (Signed)
At preop appointment pharmacy confirmed medications with patient .  Patient had instruction sheet from [facility] that stated patient was to stop Brillinta 5 days prior to surgery.  Patient was unsure whether to stop Brillinta o 05/29/2016 or 05/30/2016.  Placed a call into Pam at [facility] and left her a voice mail message regarding this with patient's cell number, home number and phone number for preop  Nurse as to when patient's last dose of Brillinta should be.  Patient was also given a note to expect a phone call from [facility] regarding this attached to his preop instructions from [facility].

## 2016-05-28 NOTE — Progress Notes (Signed)
EKG-11/22/15- EPIC  Cath- 11/21/15- EPIC  03/12/16- LOV - Cardiology- EPIC

## 2016-05-28 NOTE — Progress Notes (Signed)
BMp done 05/28/16 faxed via epic to Dr Jeffie Pollock.

## 2016-05-28 NOTE — Patient Instructions (Signed)
Elijah Santana  05/28/2016   Your procedure is scheduled on: 06/04/2016   Report to [facility] Main  Entrance take [place]  elevators to 3rd floor to  [place] at    [place] AM.  Call this number if you have problems the morning of surgery [phone]   Remember: ONLY 1 PERSON MAY GO WITH YOU TO SHORT STAY TO GET  READY MORNING OF [name].  Do not eat food or drink liquids :After Midnight.     Take these medicines the morning of surgery with A SIP OF WATER: uroxatral,  norvasc ( amlodipine), Imdu8r ( isosorbide mononitrate), metoprololo ( lopressor), protonix, ranexa,                                 You may not have any metal on your body including hair pins and              piercings  Do not wear jewelry, , lotions, powders or perfumes, deodorant               Men may shave face and neck.   Do not bring valuables to the hospital. [name].  Contacts, dentures or bridgework may not be worn into surgery.      Patients discharged the day of surgery will not be allowed to drive home.  Name and phone number of your driver:minister or wife   Special Instructions: coughing and deep breathing exercises, leg exercises               Please read over the following fact sheets you were given: _____________________________________________________________________             [facility] - Preparing for Surgery Before surgery, you can play an important role.  Because skin is not sterile, your skin needs to be as free of germs as possible.  You can reduce the number of germs on your skin by washing with CHG (chlorahexidine gluconate) soap before surgery.  CHG is an antiseptic cleaner which kills germs and bonds with the skin to continue killing germs even after washing. Please DO NOT use if you have an allergy to CHG or antibacterial soaps.  If your skin becomes reddened/irritated stop using the CHG and inform  your nurse when you arrive at Short Stay. Do not shave (including legs and underarms) for at least 48 hours prior to the first CHG shower.  You may shave your face/neck. Please follow these instructions carefully:  1.  Shower with CHG Soap the night before surgery and the  morning of Surgery.  2.  If you choose to wash your hair, wash your hair first as usual with your  normal  shampoo.  3.  After you shampoo, rinse your hair and body thoroughly to remove the  shampoo.                           4.  Use CHG as you would any other liquid soap.  You can apply chg directly  to the skin and wash                       Gently  with a scrungie or clean washcloth.  5.  Apply the CHG Soap to your body ONLY FROM THE NECK DOWN.   Do not use on face/ open                           Wound or open sores. Avoid contact with eyes, ears mouth and genitals (private parts).                       Wash face,  Genitals (private parts) with your normal soap.             6.  Wash thoroughly, paying special attention to the area where your surgery  will be performed.  7.  Thoroughly rinse your body with warm water from the neck down.  8.  DO NOT shower/wash with your normal soap after using and rinsing off  the CHG Soap.                9.  Pat yourself dry with a clean towel.            10.  Wear clean pajamas.            11.  Place clean sheets on your bed the night of your first shower and do not  sleep with pets. Day of Surgery : Do not apply any lotions/deodorants the morning of surgery.  Please wear clean clothes to the hospital/surgery center.  FAILURE TO FOLLOW THESE INSTRUCTIONS MAY RESULT IN THE CANCELLATION OF YOUR SURGERY PATIENT SIGNATURE_________________________________  NURSE SIGNATURE__________________________________  ________________________________________________________________________

## 2016-05-29 NOTE — Progress Notes (Signed)
Spoke with wife and wife stated that [facility] called patient on 05/28/2016 and that [facility] told patient to stop Brillinta on 05/30/2016 per wife.  Patient was unavailable to talk to he had gone out to lunch with a friend.

## 2016-06-03 NOTE — H&P (Signed)
HPI: I have symptoms of an enlarged prostate.    CC/HPI: AUA Questions Scoring.    CC/HPI: I have prostate cancer.      [name] returns today in f/u. He has BOO with chronic retention and hydro but his Cr in May was 1.0. He needs a channel TUR/laser but that is delayed because of the Brilinta. His voiding symptoms are stable with an IPSS of 29.   His PSA up to 12.1 in April and staging studies were obtained. He was found to have no obvious mets but on bonescan and on CT he appears to be in retention with bilateral hydro that is a new finding. His PSADT is 12-15 months. His testosterone was castrate. He is on Lupron only for his D0 prostate cancer. He had been on casodex but that was stopped for a rising PSA in 2/15.Marland Kitchen   He has a history of prostate cancer initially treated with EXRT over 16 years ago.   He had a stent placed in January and is on Brilinta in a clinical trial. He has been evaluated by cardiology and has to stay on the med at least until 05/22/16.      ALLERGIES: No Allergies    MEDICATIONS: Alfuzosin HCl ER 10 MG Oral Tablet Extended Release 24 Hour 0 Oral  AmLODIPine Besylate 10 MG Oral Tablet Oral  Aspirin 325 MG Oral Tablet Oral  Brilinta 60 MG Oral Tablet Oral  Isosorbide Mononitrate ER 60 MG Oral Tablet Extended Release 24 Hour Oral  Metoprolol Tartrate 50 MG Oral Tablet Oral  Nitrostat 0.4 MG Sublingual Tablet Sublingual Sublingual  Protonix 40 MG Oral Tablet Delayed Release Oral  Simvastatin 20 MG Oral Tablet Oral     GU PSH: No GU PSH      PSH Notes: Exploratory Laparotomy, Cath Stent Placement, Heart Surgery, CABG (CABG), Dermatological Surgery, Dermatological Surgery   NON-GU PSH: Coronary Artery Bypass Grafting (cabg) - 2013 Exploratory Laparotomy - 03/06/2016    GU PMH: Hydronephrosis Unspec, Bilateral hydronephrosis - 04/10/2016 Prostate Cancer, Prostate cancer - 04/10/2016 Retention Of Urine Ot, Acute urinary retention - 04/10/2016 Nocturia,  Nocturia - 03/29/2016 BPH w/LUTS, Benign prostatic hypertrophy (BPH) with incomplete bladder emptying - 03/06/2016 Low back pain, Back pain, lumbosacral - 06/28/2015 Post-void dribbling, Post-void dribbling - 2015 History of prostate cancer, Prostate Cancer - 2014    NON-GU PMH: Encounter for general adult medical examination without abnormal findings, Encounter for preventive health examination - 02/25/2015 Personal history of other diseases of the circulatory system, History of cardiac disorder - 2014, History of hypertension, - 2014 Personal history of other endocrine, nutritional and metabolic disease, History of hyperlipidemia - 2014 , Taking High-risk Medication - 2013, Skin Cancer, - 2013, Coronary Artery Disease, - 2013, Chronic Reflux Esophagitis, - 2013, Arthritis, - 2013    FAMILY HISTORY: Brain Cancer - Runs In Family Carcinoma Of The Stomach - Runs In Family Death In The Family Father - Runs In Family Death In The Family Mother - Runs In [facility] Family Health Status Number Of Children - Runs In Family Heart Disease - Runs In Family   SOCIAL HISTORY: No Social History     Notes: Former smoker, Alcohol Use, Marital History - Currently Married, Occupation: Retired, Caffeine Use   REVIEW OF SYSTEMS:    GU Review Male:   Patient reports get up at night to urinate, leakage of urine, stream starts and stops, and trouble starting your stream. Patient denies frequent urination, hard to postpone urination, burning/ pain with  urination, have to strain to urinate , erection problems, and penile pain.  Gastrointestinal (Upper):   Patient denies nausea, vomiting, and indigestion/ heartburn.  Gastrointestinal (Lower):   Patient reports constipation. Patient denies diarrhea.  Constitutional:   Patient denies fever, night sweats, weight loss, and fatigue.  Skin:   Patient denies skin rash/ lesion and itching.  Eyes:   Patient denies blurred vision and double vision.  Ears/ Nose/ Throat:    Patient denies sore throat and sinus problems.  Hematologic/Lymphatic:   Patient denies easy bruising and swollen glands.  Cardiovascular:   Patient denies leg swelling and chest pains.  Respiratory:   Patient reports shortness of breath. Patient denies cough.  Endocrine:   Patient denies excessive thirst.  Musculoskeletal:   Patient reports back pain and joint pain.   Neurological:   Patient denies headaches and dizziness.  Psychologic:   Patient denies depression and anxiety.   VITAL SIGNS:    Weight: 215 lb/97.5 kg   Height/Length: 68 in / 173 cm   BP: 122/70 mmHg   Pulse: 59 /min   Temp: 97.6 F / 36 C   BMI: 32.7      MULTI-SYSTEM PHYSICAL EXAMINATION:    Constitutional: Well-nourished. No physical deformities. Normally developed. Good grooming.  Respiratory: No labored breathing, no use of accessory muscles.   Cardiovascular: Normal temperature, normal extremity pulses, no swelling, no varicosities.   PAST DATA REVIEWED:  Source Of History:  Patient  Lab Test Review:   Creatinine  Notes:                     His Cr was 1 in May despite bilateral hydro. UA is clear today.    PROCEDURES:         Flexible Cystoscopy - 52000  Risks, benefits, and some of the potential complications of the procedure were discussed. Cipro 500mg  given for antibiotic prophylaxis.     Meatus:  Normal size. Normal location. Normal condition.  Urethra:  No strictures.  External Sphincter:  Normal.  Verumontanum:  Normal.  Prostate:  The prostate is approximately 3cm with trilobar hyperplasia and an obstructing, ball valving middle lobe. There are radiation changes with mucosal blanching noted.   Bladder Neck:  Non-obstructing.  Ureteral Orifices:  Normal location. Normal size. Normal shape. Effluxed clear urine.  Bladder:  Severe trabeculation. No tumors. Normal mucosa. No stones. He has a very large PVR.       The procedure was well tolerated and there were no complications.         Catheter /  SP Tube - [id number] Simple Foley Catheterization  A 16 French coude Foley catheter was inserted into the bladder using sterile technique. The patient was taught routine catheter care. A leg bag was connected. 1200 cc of urine was obtained.   ASSESSMENT:      ICD-10 Details  1 GU:   BPH w/LUTS - N40.1 He has trilobar hyperplasia on cystoscopy.   2   Prostate Cancer - C61 This could be contributing to the retention since he has CRCP.   3   Retention Of Urine Ot - R33.8 Worsening - He has a very elevated PVR with a history of bilateral hydro.    PLAN:           Schedule Return Visit: 3 Weeks - Schedule Surgery  Return Notes: He is going to need to be schedules for a Greenlight TUR in about 2-3 weeks and will need to be off  of Brilinta for 5 days prior.   Procedure: 05/08/2016 at [facility] [phone] - Flexible Cystoscopy (Cystoscopy) - 52000          Document Letter(s):  Created for Patient: Clinical Summary         Notes:   Risks of the Greenlight were reviewed at his last visit and reinforced today.

## 2016-06-04 ENCOUNTER — Emergency Department (HOSPITAL_COMMUNITY)
Admission: EM | Admit: 2016-06-04 | Discharge: 2016-06-04 | Discharge status: Against Medical Advice | Payer: Medicare Other | Source: Home / Self Care | Attending: Emergency Medicine | Admitting: Emergency Medicine

## 2016-06-04 ENCOUNTER — Ambulatory Visit (HOSPITAL_COMMUNITY): Payer: Medicare Other | Admitting: Anesthesiology

## 2016-06-04 ENCOUNTER — Ambulatory Visit (HOSPITAL_COMMUNITY)
Admission: RE | Admit: 2016-06-04 | Discharge: 2016-06-04 | Disposition: A | Discharge status: Home / Self Care | Payer: Medicare Other | Source: Ambulatory Visit | Attending: Urology | Admitting: Urology

## 2016-06-04 ENCOUNTER — Encounter (HOSPITAL_COMMUNITY): Payer: Self-pay | Admitting: Emergency Medicine

## 2016-06-04 ENCOUNTER — Encounter (HOSPITAL_COMMUNITY)
Admission: RE | Disposition: A | Discharge status: Home / Self Care | Payer: Self-pay | Source: Ambulatory Visit | Attending: Urology

## 2016-06-04 ENCOUNTER — Encounter (HOSPITAL_COMMUNITY): Payer: Self-pay | Admitting: *Deleted

## 2016-06-04 DIAGNOSIS — Z85828 Personal history of other malignant neoplasm of skin: Secondary | ICD-10-CM | POA: Insufficient documentation

## 2016-06-04 DIAGNOSIS — E782 Mixed hyperlipidemia: Secondary | ICD-10-CM

## 2016-06-04 DIAGNOSIS — I252 Old myocardial infarction: Secondary | ICD-10-CM | POA: Insufficient documentation

## 2016-06-04 DIAGNOSIS — N1330 Unspecified hydronephrosis: Secondary | ICD-10-CM | POA: Diagnosis not present

## 2016-06-04 DIAGNOSIS — Z7982 Long term (current) use of aspirin: Secondary | ICD-10-CM | POA: Insufficient documentation

## 2016-06-04 DIAGNOSIS — Z87891 Personal history of nicotine dependence: Secondary | ICD-10-CM | POA: Insufficient documentation

## 2016-06-04 DIAGNOSIS — J45909 Unspecified asthma, uncomplicated: Secondary | ICD-10-CM

## 2016-06-04 DIAGNOSIS — K210 Gastro-esophageal reflux disease with esophagitis: Secondary | ICD-10-CM | POA: Insufficient documentation

## 2016-06-04 DIAGNOSIS — C61 Malignant neoplasm of prostate: Secondary | ICD-10-CM | POA: Diagnosis not present

## 2016-06-04 DIAGNOSIS — I10 Essential (primary) hypertension: Secondary | ICD-10-CM | POA: Insufficient documentation

## 2016-06-04 DIAGNOSIS — E785 Hyperlipidemia, unspecified: Secondary | ICD-10-CM | POA: Diagnosis not present

## 2016-06-04 DIAGNOSIS — Y69 Unspecified misadventure during surgical and medical care: Secondary | ICD-10-CM | POA: Insufficient documentation

## 2016-06-04 DIAGNOSIS — Z8546 Personal history of malignant neoplasm of prostate: Secondary | ICD-10-CM | POA: Insufficient documentation

## 2016-06-04 DIAGNOSIS — Z951 Presence of aortocoronary bypass graft: Secondary | ICD-10-CM | POA: Diagnosis not present

## 2016-06-04 DIAGNOSIS — N401 Enlarged prostate with lower urinary tract symptoms: Secondary | ICD-10-CM | POA: Diagnosis not present

## 2016-06-04 DIAGNOSIS — M1990 Unspecified osteoarthritis, unspecified site: Secondary | ICD-10-CM

## 2016-06-04 DIAGNOSIS — T83018A Breakdown (mechanical) of other indwelling urethral catheter, initial encounter: Secondary | ICD-10-CM

## 2016-06-04 DIAGNOSIS — R338 Other retention of urine: Secondary | ICD-10-CM | POA: Diagnosis not present

## 2016-06-04 DIAGNOSIS — Z8673 Personal history of transient ischemic attack (TIA), and cerebral infarction without residual deficits: Secondary | ICD-10-CM | POA: Insufficient documentation

## 2016-06-04 DIAGNOSIS — Z006 Encounter for examination for normal comparison and control in clinical research program: Secondary | ICD-10-CM | POA: Insufficient documentation

## 2016-06-04 DIAGNOSIS — I2510 Atherosclerotic heart disease of native coronary artery without angina pectoris: Secondary | ICD-10-CM | POA: Insufficient documentation

## 2016-06-04 DIAGNOSIS — Z79899 Other long term (current) drug therapy: Secondary | ICD-10-CM | POA: Insufficient documentation

## 2016-06-04 DIAGNOSIS — T83011A Breakdown (mechanical) of indwelling urethral catheter, initial encounter: Secondary | ICD-10-CM

## 2016-06-04 DIAGNOSIS — Z955 Presence of coronary angioplasty implant and graft: Secondary | ICD-10-CM | POA: Insufficient documentation

## 2016-06-04 HISTORY — PX: GREEN LIGHT LASER TURP (TRANSURETHRAL RESECTION OF PROSTATE: SHX6260

## 2016-06-04 SURGERY — GREEN LIGHT LASER TURP (TRANSURETHRAL RESECTION OF PROSTATE
Anesthesia: General

## 2016-06-04 MED ORDER — DEXAMETHASONE SODIUM PHOSPHATE 10 MG/ML IJ SOLN
INTRAMUSCULAR | Status: AC
Start: 2016-06-04 — End: 2016-06-04
  Filled 2016-06-04: qty 1

## 2016-06-04 MED ORDER — MEPERIDINE HCL 50 MG/ML IJ SOLN: 6.2500 mg | INTRAMUSCULAR | Status: DC | PRN

## 2016-06-04 MED ORDER — LACTATED RINGERS IV SOLN
INTRAVENOUS | Status: DC | PRN
  Administered 2016-06-04: 07:00:00 via INTRAVENOUS

## 2016-06-04 MED ORDER — ONDANSETRON HCL 4 MG/2ML IJ SOLN
INTRAMUSCULAR | Status: AC
Start: 2016-06-04 — End: 2016-06-04
  Filled 2016-06-04: qty 2

## 2016-06-04 MED ORDER — EPHEDRINE SULFATE 50 MG/ML IJ SOLN
INTRAMUSCULAR | Status: DC | PRN
  Administered 2016-06-04 (×2): 10 mg via INTRAVENOUS

## 2016-06-04 MED ORDER — PROPOFOL 10 MG/ML IV BOLUS
INTRAVENOUS | Status: AC
  Filled 2016-06-04: qty 20

## 2016-06-04 MED ORDER — PROPOFOL 10 MG/ML IV BOLUS
INTRAVENOUS | Status: DC | PRN
  Administered 2016-06-04: 150 mg via INTRAVENOUS

## 2016-06-04 MED ORDER — CIPROFLOXACIN HCL 250 MG PO TABS: 250.0000 mg | ORAL_TABLET | Freq: Two times a day (BID) | ORAL | Status: DC

## 2016-06-04 MED ORDER — CEFAZOLIN SODIUM-DEXTROSE 2-4 GM/100ML-% IV SOLN
INTRAVENOUS | Status: AC
  Filled 2016-06-04: qty 100

## 2016-06-04 MED ORDER — CEFAZOLIN SODIUM-DEXTROSE 2-4 GM/100ML-% IV SOLN
2.0000 g | INTRAVENOUS | Status: AC
  Administered 2016-06-04: 2 g via INTRAVENOUS
  Filled 2016-06-04: qty 100

## 2016-06-04 MED ORDER — DEXAMETHASONE SODIUM PHOSPHATE 10 MG/ML IJ SOLN
INTRAMUSCULAR | Status: DC | PRN
  Administered 2016-06-04: 10 mg via INTRAVENOUS

## 2016-06-04 MED ORDER — PROMETHAZINE HCL 25 MG/ML IJ SOLN: 6.2500 mg | INTRAMUSCULAR | Status: DC | PRN

## 2016-06-04 MED ORDER — FENTANYL CITRATE (PF) 100 MCG/2ML IJ SOLN
INTRAMUSCULAR | Status: AC
Start: 2016-06-04 — End: 2016-06-04
  Filled 2016-06-04: qty 2

## 2016-06-04 MED ORDER — LIDOCAINE HCL (CARDIAC) 20 MG/ML IV SOLN
INTRAVENOUS | Status: DC | PRN
  Administered 2016-06-04: 100 mg via INTRAVENOUS

## 2016-06-04 MED ORDER — SODIUM CHLORIDE 0.9 % IR SOLN
Status: DC | PRN
  Administered 2016-06-04: 9000 mL via INTRAVESICAL

## 2016-06-04 MED ORDER — LACTATED RINGERS IV SOLN: INTRAVENOUS | Status: DC

## 2016-06-04 MED ORDER — FENTANYL CITRATE (PF) 100 MCG/2ML IJ SOLN
INTRAMUSCULAR | Status: DC | PRN
  Administered 2016-06-04 (×2): 50 ug via INTRAVENOUS

## 2016-06-04 MED ORDER — ISOPROPYL ALCOHOL 70 % SOLN
Status: AC
  Filled 2016-06-04: qty 480

## 2016-06-04 MED ORDER — LIDOCAINE HCL (CARDIAC) 20 MG/ML IV SOLN
INTRAVENOUS | Status: AC
  Filled 2016-06-04: qty 5

## 2016-06-04 MED ORDER — CIPROFLOXACIN HCL 250 MG PO TABS: 250.0000 mg | ORAL_TABLET | Freq: Two times a day (BID) | ORAL | Status: AC

## 2016-06-04 MED ORDER — ONDANSETRON HCL 4 MG/2ML IJ SOLN
INTRAMUSCULAR | Status: DC | PRN
  Administered 2016-06-04: 4 mg via INTRAVENOUS

## 2016-06-04 MED ORDER — FENTANYL CITRATE (PF) 100 MCG/2ML IJ SOLN: 25.0000 ug | INTRAMUSCULAR | Status: DC | PRN

## 2016-06-04 SURGICAL SUPPLY — 17 items
BAG URINE DRAINAGE (UROLOGICAL SUPPLIES) ×3 IMPLANT
BAG URO CATCHER STRL LF (MISCELLANEOUS) ×3 IMPLANT
CATH FOLEY 2WAY SLVR  5CC 20FR (CATHETERS) ×2
CATH FOLEY 2WAY SLVR 5CC 20FR (CATHETERS) IMPLANT
CATH URET 5FR 28IN OPEN ENDED (CATHETERS) IMPLANT
FEE RENTAL LASER GREENLIGHT (Laser) IMPLANT
GLOVE BIOGEL M STRL SZ7.5 (GLOVE) ×3 IMPLANT
GLOVE SURG SS PI 8.0 STRL IVOR (GLOVE) IMPLANT
GOWN STRL REUS W/TWL XL LVL3 (GOWN DISPOSABLE) ×3 IMPLANT
HOLDER FOLEY CATH W/STRAP (MISCELLANEOUS) ×2 IMPLANT
LASER FIBER /GREENLIGHT LASER (Laser) ×2 IMPLANT
LASER GREENLIGHT RENTAL P/PROC (Laser) ×3 IMPLANT
PACK CYSTO (CUSTOM PROCEDURE TRAY) ×3 IMPLANT
SYR 30ML LL (SYRINGE) ×2 IMPLANT
SYRINGE IRR TOOMEY STRL 70CC (SYRINGE) IMPLANT
TUBING CONNECTING 10 (TUBING) ×2 IMPLANT
TUBING CONNECTING 10' (TUBING) ×1

## 2016-06-04 NOTE — ED Notes (Signed)
Pt states he has prostate surgery today and catheter placed which is not draining per pt.

## 2016-06-04 NOTE — Interval H&P Note (Signed)
History and Physical Interval Note:  06/04/2016 7:11 AM  Elijah Santana  has presented today for surgery, with the diagnosis of BPH WITH RETENTION  The various methods of treatment have been discussed with the patient and family. After consideration of risks, benefits and other options for treatment, the patient has consented to  Procedure(s): GREEN LIGHT LASER TURP (TRANSURETHRAL RESECTION OF PROSTATE (N/A) as a surgical intervention .  The patient's history has been reviewed, patient examined, no change in status, stable for surgery.  I have reviewed the patient's chart and labs.  Questions were answered to the patient's satisfaction.     Kierstan Auer J

## 2016-06-04 NOTE — Anesthesia Preprocedure Evaluation (Addendum)
Anesthesia Evaluation  Patient identified by MRN, date of birth, ID band Patient awake    Reviewed: Allergy & Precautions, NPO status , Patient's Chart, lab work & pertinent test results, reviewed documented beta blocker date and time   Airway Mallampati: II  TM Distance: >3 FB Neck ROM: Full    Dental  (+) Edentulous Upper, Edentulous Lower   Pulmonary asthma , former smoker,    breath sounds clear to auscultation       Cardiovascular hypertension, Pt. on medications and Pt. on home beta blockers + CAD, + Past MI, + Cardiac Stents and + CABG   Rhythm:Regular Rate:Normal     Neuro/Psych CVA negative psych ROS   GI/Hepatic Neg liver ROS, GERD  Medicated,  Endo/Other  negative endocrine ROS  Renal/GU negative Renal ROS  negative genitourinary   Musculoskeletal  (+) Arthritis ,   Abdominal   Peds negative pediatric ROS (+)  Hematology negative hematology ROS (+)   Anesthesia Other Findings   Reproductive/Obstetrics negative OB ROS                            Lab Results  Component Value Date   WBC 6.3 05/28/2016   HGB 12.7* 05/28/2016   HCT 38.7* 05/28/2016   MCV 91.7 05/28/2016   PLT 239 05/28/2016   Lab Results  Component Value Date   CREATININE 1.11 05/28/2016   BUN 21* 05/28/2016   NA 139 05/28/2016   K 4.3 05/28/2016   CL 108 05/28/2016   CO2 25 05/28/2016   Lab Results  Component Value Date   INR 1.15 05/28/2016   INR 1.07 11/21/2015   INR 1.11 01/01/2015   11/2015 EKG: normal sinus rhythm, 1st degree AV block.   Anesthesia Physical Anesthesia Plan  ASA: III  Anesthesia Plan: General   Post-op Pain Management:    Induction: Intravenous  Airway Management Planned: LMA  Additional Equipment:   Intra-op Plan:   Post-operative Plan: Extubation in OR  Informed Consent: I have reviewed the patients History and Physical, chart, labs and discussed the procedure  including the risks, benefits and alternatives for the proposed anesthesia with the patient or authorized representative who has indicated his/her understanding and acceptance.   Dental advisory given  Plan Discussed with: CRNA  Anesthesia Plan Comments:         Anesthesia Quick Evaluation

## 2016-06-04 NOTE — Discharge Instructions (Signed)
Elijah Santana Laser Prostate Treatment Green light laser therapy is a procedure that uses a special high-energy laser for vaporizing extra prostate tissue. It is less invasive than traditional methods of prostate surgery, which involve cutting out the prostate tissue. Because the tissue is vaporized rather than cut out there is generally less blood loss. LET [facility] CARE PROVIDER KNOW ABOUT:  Any allergies you have.  Any medicines you are taking, including vitamins, herbs, eye drops, creams, and over-the-counter medication.  Previous problems you or members of your family have had with the use of anesthetics.  Any blood disorders you have. Previous surgeries you have had.CYSTOSCOPY HOME CARE INSTRUCTIONS  Activity: Rest for the remainder of the day.  Do not drive or operate equipment today.  You may resume normal activities in one to two days as instructed by your physician.   Meals: Drink plenty of liquids and eat light foods such as gelatin or soup this evening.  You may return to a normal meal plan tomorrow.  Return to Work: You may return to work in one to two days or as instructed by your physician.  Special Instructions / Symptoms: Call your physician if any of these symptoms occur:   -persistent or heavy bleeding  -bleeding which continues after first few urination  -large blood clots that are difficult to pass  -urine stream diminishes or stops completely  -fever equal to or higher than 101 degrees Farenheit.  -cloudy urine with a strong, foul odor  -severe pain  Females should always wipe from front to back after elimination.  You may feel some burning pain when you urinate.  This should disappear with time.  Applying moist heat to the lower abdomen or a hot tub bath may help relieve the pain. \   Patient Signature:  ________________________________________________________  Nurse's Signature:  ________________________________________________________    Medical  conditions you have. RISKS AND COMPLICATIONS Generally, green light laser prostate treatment is a safe procedure. However, as with any procedure, complications can occur. Possible complications include:  Urinary tract infection.  Erectile dysfunction (rare).  Dry ejaculation--Semen is not released when you reach sexual climax.  Scar tissue in the urinary passage. BEFORE THE PROCEDURE   Your health care provider may discuss medicines you are taking and may advise you to stop taking specific ones.  You may be given antibiotic medicine to take as a precaution against bacterial infection.  Do not eat or drink anything for 8 hours before your procedure or as directed by your health care provider. You may have a sip of water to take any necessary medicines. PROCEDURE Depending on the size and shape of your prostate, the procedure may take 30-60 minutes. You will be given one of the following:   A medicine that makes you go to sleep (general anesthetic).  A medicine injected into your spine that numbs your body below the waist (spinal anesthetic). Sedation is usually given with spinal anesthetic so you will be relaxed. A tube containing viewing scopes and instruments will be inserted through your penis so that no cuts (incisions) are needed. A thin fiber is put through the tube and positioned next to the excess prostate tissue. Pulses of laser light come from the end of the fiber and are projected onto the excess tissue. The laser beam is absorbed by your blood, which becomes hot enough to vaporize the excess prostate tissue. This laser beam will seal off the blood vessels, decreasing bleeding. The tube with the viewing scopes, instruments, and  thin fiber will be removed and replaced with a temporary catheter. AFTER THE PROCEDURE  After the surgery, you will be sent to the recovery room for a short time. Depending on factors such as the amount of prostate tissue vaporized, the strength of your  bladder, and the amount of bleeding expected, the catheter may be removed. Generally, overnight stay is not needed and you will be sent home on the same day as the procedure. You may be sent home with elastic support stockings to help prevent blood clots in your legs.    This information is not intended to replace advice given to you by your health care provider. Make sure you discuss any questions you have with your health care provider.   Document Released: 02/09/2008 Document Revised: 11/07/2013 Document Reviewed: 04/24/2013 Elsevier Interactive Patient Education [employer].

## 2016-06-04 NOTE — Op Note (Deleted)
NAMENAVNEET, PENTECOST NO.:  [id number]  MEDICAL RECORD NO.:  [id number]  LOCATION:                                 FACILITY:  PHYSICIAN:  Marshall Cork. Jeffie Pollock, M.D.    DATE OF BIRTH:  04-04-1932  DATE OF PROCEDURE:  06/04/2016 DATE OF DISCHARGE:                              OPERATIVE REPORT   PROCEDURE:  GreenLight laser prostatectomy.  PREOPERATIVE DIAGNOSIS:  Bladder outlet obstruction with retention.  POSTOPERATIVE DIAGNOSIS:  Bladder outlet obstruction with retention.  SURGEON:  Marshall Cork. Jeffie Pollock, M.D.  ANESTHESIA:  General.  SPECIMEN:  None.  DRAINS:  A 20-French Foley catheter.  BLOOD LOSS:  None.  SPECIMEN:  None.  COMPLICATIONS:  None.  INDICATIONS:  Mr. Tai is an 81 year old white male with urinary retention from an obstructing prostate.  He has a history of prostate cancer.  He has elected GreenLight laser for treatment of the retention.  FINDINGS AND PROCEDURE:  He was given Ancef.  He was taken to the operating room where general anesthetic was induced.  He was placed in lithotomy position.  His perineum and genitalia were prepped with Betadine solution, he was draped in usual sterile fashion.  The laser scope was inserted using the 30-degree lens.  Examination demonstrated normal urethra.  The external sphincter was intact.  The prostatic urethra was approximately 4 cm in length with bilobar hyperplasia with obstruction.  There was small elevation of the bladder neck, but no significant middle lobe.  Examination of the bladder revealed severe trabeculation, multiple cellules and diverticuli.  The ureteral orifices were unremarkable.  After initial inspection, the GreenLight laser fiber was inserted after it was confirmed that it was irrigating appropriately and the initial power set on 80 watts.  The bladder neck was ablated from 5 to 7 o'clock at 80 watts the power, then increased to 120 watts, and a generous channel was created  by ablation of the floor of the prostate in the lateral lobes out to alongside the verumontanum.  Total of 60,331 joules of energy were applied during the procedure.  At the completion, there was a very adequate channel through the prostate.  No bleeding was noted.  After removal of the scope, there was a good urinary stream with pressure on the bladder.  A 20-French Foley catheter was inserted.  The balloon was filled with 10 mL sterile fluid.  The catheter was irrigated with clear return.  The patient was taken down from lithotomy position.  His anesthetic was reversed.  He was moved to the recovery room in stable condition.  There were no complications.     Marshall Cork. Jeffie Pollock, M.D.   ______________________________ Marshall Cork. Jeffie Pollock, M.D.    JJW/MEDQ  D:  06/04/2016  T:  06/04/2016  Job:  [id number]

## 2016-06-04 NOTE — ED Notes (Signed)
Pt still not in room. 

## 2016-06-04 NOTE — Progress Notes (Signed)
Pt unable to void.. Pt has had 1000 ml fluid bolus and 480 ml of liquids.. Dr. Jeffie Pollock paged and instructed to place foley cath and leave in over the weekend.. Pt instructed to return to Dr.  Jeffie Pollock office Monday am.. Pt voiced understanding.

## 2016-06-04 NOTE — Op Note (Signed)
Elijah Santana, Elijah Santana NO.:  [id number]  MEDICAL RECORD NO.:  [id number]  LOCATION:                                 FACILITY:  PHYSICIAN:  Marshall Cork. Jeffie Pollock, M.D.    DATE OF BIRTH:  [birth year]  DATE OF PROCEDURE:  06/04/2016 DATE OF DISCHARGE:                              OPERATIVE REPORT   PROCEDURE:  GreenLight laser prostatectomy.  PREOPERATIVE DIAGNOSIS:  Bladder outlet obstruction with retention.  POSTOPERATIVE DIAGNOSIS:  Bladder outlet obstruction with retention.  SURGEON:  Marshall Cork. Jeffie Pollock, M.D.  ANESTHESIA:  General.  SPECIMEN:  None.  DRAINS:  A 20-French Foley catheter.  BLOOD LOSS:  None.  SPECIMEN:  None.  COMPLICATIONS:  None.  INDICATIONS:  [name] is an 82 year old white male with urinary retention from an obstructing prostate.  He has a history of prostate cancer.  He has elected GreenLight laser for treatment of the retention.  FINDINGS AND PROCEDURE:  He was given Ancef.  He was taken to the operating room where general anesthetic was induced.  He was placed in lithotomy position.  His perineum and genitalia were prepped with Betadine solution, he was draped in usual sterile fashion.  The laser scope was inserted using the 30-degree lens.  Examination demonstrated normal urethra.  The external sphincter was intact.  The prostatic urethra was approximately 4 cm in length with bilobar hyperplasia with obstruction.  There was small elevation of the bladder neck, but no significant middle lobe.  Examination of the bladder revealed severe trabeculation, multiple cellules and diverticuli.  The ureteral orifices were unremarkable.  After initial inspection, the GreenLight laser fiber was inserted after it was confirmed that it was irrigating appropriately and the initial power set on 80 watts.  The bladder neck was ablated from 5 to 7 o'clock at 80 watts the power, then increased to 120 watts, and a generous channel was created  by ablation of the floor of the prostate in the lateral lobes out to alongside the verumontanum.  Total of 60,331 joules of energy were applied during the procedure.  At the completion, there was a very adequate channel through the prostate.  No bleeding was noted.  After removal of the scope, there was a good urinary stream with pressure on the bladder.  A 20-French Foley catheter was inserted.  The balloon was filled with 10 mL sterile fluid.  The catheter was irrigated with clear return.  The patient was taken down from lithotomy position.  His anesthetic was reversed.  He was moved to the recovery room in stable condition.  There were no complications.     Marshall Cork. Jeffie Pollock, M.D.   ______________________________ Marshall Cork. Jeffie Pollock, M.D.    JJW/MEDQ  D:  06/04/2016  T:  06/04/2016  Job:  [id number]

## 2016-06-04 NOTE — Transfer of Care (Signed)
Immediate Anesthesia Transfer of Care Note  Patient: Elijah Santana  Procedure(s) Performed: Procedure(s): GREEN LIGHT LASER TURP (TRANSURETHRAL RESECTION OF PROSTATE (N/A)  Patient Location: PACU  Anesthesia Type:General  Level of Consciousness: sedated  Airway & Oxygen Therapy: Patient Spontanous Breathing and Patient connected to face mask oxygen  Post-op Assessment: Report given to RN and Post -op Vital signs reviewed and stable  Post vital signs: Reviewed and stable  Last Vitals:  Filed Vitals:   06/04/16 0532  BP: 116/63  Pulse: 55  Temp: 36.4 C  Resp: 18    Last Pain: There were no vitals filed for this visit.    Patients Stated Pain Goal: 4 ([id number])  Complications: No apparent anesthesia complications

## 2016-06-04 NOTE — Brief Op Note (Signed)
06/04/2016  8:09 AM  PATIENT:  Elijah Santana  82 y.o. male  PRE-OPERATIVE DIAGNOSIS:  BPH WITH RETENTION  POST-OPERATIVE DIAGNOSIS:  BPH WITH RETENTION  PROCEDURE:  Procedure(s): GREEN LIGHT LASER TURP (TRANSURETHRAL RESECTION OF PROSTATE (N/A)  SURGEON:  Surgeon(s) and Role:    * Irine Seal, MD - Primary  PHYSICIAN ASSISTANT:   ASSISTANTS: none   ANESTHESIA:   general  EBL:     BLOOD ADMINISTERED:none  DRAINS: Urinary Catheter (Foley)   LOCAL MEDICATIONS USED:  NONE  SPECIMEN:  No Specimen  DISPOSITION OF SPECIMEN:  N/A  COUNTS:  YES  TOURNIQUET:  * No tourniquets in log *  DICTATION: .Other Dictation: Dictation Number [id number]  PLAN OF CARE: Discharge to home after PACU  PATIENT DISPOSITION:  PACU - hemodynamically stable.   Delay start of Pharmacological VTE agent (>24hrs) due to surgical blood loss or risk of bleeding: not applicable

## 2016-06-04 NOTE — Anesthesia Postprocedure Evaluation (Signed)
Anesthesia Post Note  Patient: Elijah Santana  Procedure(s) Performed: Procedure(s) (LRB): GREEN LIGHT LASER TURP (TRANSURETHRAL RESECTION OF PROSTATE (N/A)  Patient location during evaluation: PACU Anesthesia Type: General Level of consciousness: awake and alert Pain management: pain level controlled Vital Signs Assessment: post-procedure vital signs reviewed and stable Respiratory status: spontaneous breathing, nonlabored ventilation, respiratory function stable and patient connected to nasal cannula oxygen Cardiovascular status: blood pressure returned to baseline and stable Postop Assessment: no signs of nausea or vomiting Anesthetic complications: no    Last Vitals:  Filed Vitals:   06/04/16 0924 06/04/16 0946  BP: 85/62 91/50  Pulse: 52 52  Temp: 36.7 C   Resp:  16    Last Pain:  Filed Vitals:   06/04/16 0949  PainSc: 2                  Effie Berkshire

## 2016-06-04 NOTE — ED Notes (Signed)
Went into patients room to irrigate foley and patient is not in room. Md and charge nurse notified.

## 2016-06-04 NOTE — ED Provider Notes (Signed)
CSN: [id number]     Arrival date & time 06/04/16  2020 History   First MD Initiated Contact with Patient 06/04/16 2043     Chief Complaint  Patient presents with  . Urinary Retention      HPI Pt was seen at 2055. Per pt, c/o gradual onset and persistence of constant "foley not draining much" for the past 3 to 4 hours. Pt states this morning he had surgery on his prostate, and the foley was placed after surgery. Pt states he feels "ok" otherwise. Denies abd pain, no N/V/D, no fevers, no hematuria, no penile pain, no flank pain, no testicular pain/swelling.    Past Medical History  Diagnosis Date  . Coronary atherosclerosis of native coronary artery     a. CABG 2004 with LIMA-LAD, SVG-D1, SVG-OM, SVG-PDA. b. Cath ~2010 with occ of SVG-diagonal, distal LAD and OM filiing by collaterals. c. NSTEMI 12/2014 s/p DES to SVG-RCA. d. 11/2015: DES to distal graft of the SVG-distal RCA  . Chronic back pain   . Mixed hyperlipidemia   . Essential hypertension   . Myocardial infarction ([place])   . NSTEMI (non-ST elevated myocardial infarction) ([place]) 01/01/15  . Asthma     Childhood  . Hyperglycemia   . Ischemic cardiomyopathy     a. EF 45% by cath 12/2014.  . Stroke ([place])     TIA- 28 years ago   . Pneumonia     hx of   . Shortness of breath dyspnea     with exertion   . GERD (gastroesophageal reflux disease)   . Arthritis     back   . Prostate cancer [facility])     Radiation therapy 1996  . Skin cancer   . Foley catheter in place   . Hard of hearing    Past Surgical History  Procedure Laterality Date  . Coronary artery bypass graft  2004    LIMA to LAD, SVG to diagonal, SVG to circumflex, SVG to PDA  . Left heart catheterization with coronary/graft angiogram N/A 01/01/2015    Procedure: LEFT HEART CATHETERIZATION WITH Beatrix Fetters;  Surgeon: Peter M Martinique, MD;  Location[facility] CATH LAB;  Service: Cardiovascular;  Laterality: N/A;  . Percutaneous coronary stent intervention (pci-s)   11/20/2015    distal SVG  with DES       . Cardiac catheterization  01/01/2015    Procedure: CORONARY STENT INTERVENTION;  Surgeon: Peter M Martinique, MD;  Location: [facility] CATH LAB;  Service: Cardiovascular;;  SVG to PDA  . Cardiac catheterization N/A 11/21/2015    Procedure: Left Heart Cath and Cors/Grafts Angiography;  Surgeon: Troy Sine, MD;  Location: [place] CV LAB;  Service: Cardiovascular;  Laterality: N/A;  . Cardiac catheterization N/A 11/21/2015    Procedure: Coronary Stent Intervention;  Surgeon: Troy Sine, MD;  Location: [place] CV LAB;  Service: Cardiovascular;  Laterality: N/A;  . Tonsillectomy     Family History  Problem Relation Age of Onset  . Hypertension Father   . Coronary artery disease Father    Social History  Substance Use Topics  . Smoking status: Former Smoker    Types: Cigars  . Smokeless tobacco: Former Systems developer    Types: Chew    Quit date: 08/07/2010     Comment: never chewed up over a pack/day  . Alcohol Use: No    Review of Systems ROS: Statement: All systems negative except as marked or noted in the HPI; Constitutional: Negative for fever and  chills. ; ; Eyes: Negative for eye pain, redness and discharge. ; ; ENMT: Negative for ear pain, hoarseness, nasal congestion, sinus pressure and sore throat. ; ; Cardiovascular: Negative for chest pain, palpitations, diaphoresis, dyspnea and peripheral edema. ; ; Respiratory: Negative for cough, wheezing and stridor. ; ; Gastrointestinal: Negative for nausea, vomiting, diarrhea, abdominal pain, blood in stool, hematemesis, jaundice and rectal bleeding. . ; ; Genitourinary: +"foley not draining." Negative for dysuria, flank pain and hematuria. ; ; Genital:  No penile drainage or rash, no testicular pain or swelling, no scrotal rash or swelling. ;; Musculoskeletal: Negative for back pain and neck pain. Negative for swelling and trauma.; ; Skin: Negative for pruritus, rash, abrasions, blisters, bruising and skin  lesion.; ; Neuro: Negative for headache, lightheadedness and neck stiffness. Negative for weakness, altered level of consciousness, altered mental status, extremity weakness, paresthesias, involuntary movement, seizure and syncope.      Allergies  Review of patient's allergies indicates no known allergies.  Home Medications   Prior to Admission medications   Medication Sig Start Date End Date Taking? Authorizing Provider  AMBULATORY NON FORMULARY MEDICATION Take 90 mg by mouth 2 (two) times daily. Medication Name: Brilinta 90 mg BID (TWILIGHT Research Study PROVIDED) 11/22/15   Burnell Blanks, MD  AMBULATORY NON FORMULARY MEDICATION Take 81 mg by mouth daily. Medication Name: ASA 81 mg Daily OR PLACEBO (TWILIGHT Research study provided) 02/13/16   Burnell Blanks, MD  amLODipine (NORVASC) 5 MG tablet Take 1 tablet (5 mg total) by mouth daily. 01/17/16   Satira Sark, MD  bisacodyl (DULCOLAX) 5 MG EC tablet Take 5-10 mg by mouth daily as needed (constipation). Reported on 05/28/2016    Historical Provider, MD  ciprofloxacin (CIPRO) 250 MG tablet Take 1 tablet (250 mg total) by mouth 2 (two) times daily. 06/04/16 06/14/16  Irine Seal, MD  doxazosin (CARDURA) 4 MG tablet Take 4 mg by mouth daily.     Historical Provider, MD  ibuprofen (ADVIL,MOTRIN) 200 MG tablet Take 200 mg by mouth every 6 (six) hours as needed for moderate pain.    Historical Provider, MD  isosorbide mononitrate (IMDUR) 60 MG 24 hr tablet Take 1 tablet (60 mg total) by mouth every morning. 12/24/15   Satira Sark, MD  metoprolol (LOPRESSOR) 50 MG tablet Take 1 tablet (50 mg total) by mouth 2 (two) times daily. 03/13/16   Satira Sark, MD  nitroGLYCERIN (NITROSTAT) 0.4 MG SL tablet PLACE ONE (1) TABLET UNDER TONGUE EVERY 5 MINUTES UP TO (3) DOSES AS NEEDED FOR CHEST PAIN. 11/22/15   Erma Heritage, PA  pantoprazole (PROTONIX) 40 MG tablet Take 1 tablet (40 mg total) by mouth daily. 03/12/16   Satira Sark, MD  PRESCRIPTION MEDICATION Hormone shots done at Dr. Ralene Muskrat office once every 4 months (for prostrate) - last injection mid December 2016    Historical Provider, MD  ranolazine (RANEXA) 500 MG 12 hr tablet Take 1 tablet (500 mg total) by mouth 2 (two) times daily. 05/18/16   Satira Sark, MD  simvastatin (ZOCOR) 10 MG tablet Take 1 tablet (10 mg total) by mouth daily. 03/20/16   Satira Sark, MD   BP 131/56 mmHg  Pulse 62  Temp(Src) 98.9 F (37.2 C) (Temporal)  Resp 16  Ht 5\' 8"  (1.727 m)  Wt 210 lb (95.255 kg)  BMI 31.94 kg/m2  SpO2 97% Physical Exam  2105: Physical examination:  Nursing notes reviewed; Vital signs and O2 SAT  reviewed;  Constitutional: Well developed, Well nourished, Well hydrated, In no acute distress; Head:  Normocephalic, atraumatic; Eyes: EOMI, PERRL, No scleral icterus; ENMT: Mouth and pharynx normal, Mucous membranes moist; Neck: Supple, Full range of motion, No lymphadenopathy; Cardiovascular: Regular rate and rhythm, No gallop; Respiratory: Breath sounds clear & equal bilaterally, No wheezes.  Speaking full sentences with ease, Normal respiratory effort/excursion; Chest: Nontender, Movement normal; Abdomen: Soft, Nontender, Nondistended, Normal bowel sounds; Genitourinary: No CVA tenderness. +foley catheter with clear yellow urine in tubing and bag. No obvious blood or clots.; Extremities: Pulses normal, No tenderness, No edema, No calf edema or asymmetry.; Neuro: AA&Ox3, Major CN grossly intact.  Speech clear. No gross focal motor or sensory deficits in extremities.; Skin: Color normal, Warm, Dry.   ED Course  Procedures (including critical care time)  Labs Review  Imaging Review  I have personally reviewed and evaluated these images and lab results as part of my medical decision-making.   EKG Interpretation None      MDM  MDM Reviewed: previous chart, nursing note and vitals      2125:  ED RN went into pt's room to flush catheter  and pt was no longer in room. Pt not found in department. Pt eloped.   Francine Graven, DO 06/06/16 2208

## 2016-06-05 ENCOUNTER — Encounter (HOSPITAL_COMMUNITY): Payer: Self-pay | Admitting: Urology

## 2016-06-19 ENCOUNTER — Ambulatory Visit (INDEPENDENT_AMBULATORY_CARE_PROVIDER_SITE_OTHER): Payer: Medicare Other | Admitting: Urology

## 2016-06-19 DIAGNOSIS — R338 Other retention of urine: Secondary | ICD-10-CM

## 2016-06-19 DIAGNOSIS — C61 Malignant neoplasm of prostate: Secondary | ICD-10-CM | POA: Diagnosis not present

## 2016-06-19 DIAGNOSIS — N401 Enlarged prostate with lower urinary tract symptoms: Secondary | ICD-10-CM | POA: Diagnosis not present

## 2016-06-30 ENCOUNTER — Telehealth: Payer: Self-pay | Admitting: *Deleted

## 2016-06-30 LAB — LIPID PANEL
Cholesterol: 179 mg/dL (ref 125–200)
HDL: 50 mg/dL (ref >=40)
LDL CALC: 95 mg/dL (ref <130)
TRIGLYCERIDES: 172 mg/dL — AB (ref <150)
Total CHOL/HDL Ratio: 3.6 Ratio (ref <=5.0)
VLDL: 34 mg/dL — AB (ref <30)

## 2016-06-30 LAB — HEPATIC FUNCTION PANEL
ALK PHOS: 52 U/L (ref 40–115)
ALT: 17 U/L (ref 9–46)
AST: 13 U/L (ref 10–35)
Albumin: 4 g/dL (ref 3.6–5.1)
BILIRUBIN DIRECT: 0.1 mg/dL (ref <=0.2)
BILIRUBIN INDIRECT: 0.5 mg/dL (ref 0.2–1.2)
Total Bilirubin: 0.6 mg/dL (ref 0.2–1.2)
Total Protein: 6.3 g/dL (ref 6.1–8.1)

## 2016-06-30 NOTE — Telephone Encounter (Signed)
-----   Message from Satira Sark, MD sent at 06/30/2016  9:14 AM EDT ----- Results reviewed. LFTs are normal. LDL is 95 now, previously 62. Check and make sure there has been no significant lapse in Zocor. A copy of this test should be forwarded to Glenda Chroman, MD.

## 2016-06-30 NOTE — Telephone Encounter (Signed)
Patient informed and said he takes his simvastatin 10 mg daily and may have missed 1-2 doses total. Copy sent to PCP.

## 2016-07-10 ENCOUNTER — Ambulatory Visit (INDEPENDENT_AMBULATORY_CARE_PROVIDER_SITE_OTHER): Payer: Medicare Other | Admitting: Urology

## 2016-07-10 DIAGNOSIS — C61 Malignant neoplasm of prostate: Secondary | ICD-10-CM

## 2016-07-10 DIAGNOSIS — R351 Nocturia: Secondary | ICD-10-CM

## 2016-07-10 DIAGNOSIS — N401 Enlarged prostate with lower urinary tract symptoms: Secondary | ICD-10-CM | POA: Diagnosis not present

## 2016-07-27 ENCOUNTER — Telehealth: Payer: Self-pay | Admitting: Cardiology

## 2016-07-27 NOTE — Telephone Encounter (Signed)
Advised to come NPO, if MD orders lab,he may do then

## 2016-07-27 NOTE — Telephone Encounter (Signed)
Needs to know if he needs any blood work done before [employer] visit

## 2016-08-18 ENCOUNTER — Other Ambulatory Visit: Payer: Self-pay | Admitting: Cardiology

## 2016-08-25 NOTE — Progress Notes (Signed)
Cardiology Office Note  Date: 08/26/2016   ID: Elijah Santana, Elijah Santana November 19, 1931, MRN [id number]  PCP: Elijah Chroman, MD  Primary Cardiologist: Elijah Lesches, MD   Chief Complaint  Patient presents with  . Coronary Artery Disease    History of Present Illness: Elijah Santana is an 82 y.o. male last seen in April. He presents for a routine follow-up visit. Reports no increasing angina symptoms, does have to use nitroglycerin intermittently. He is relatively inactive, has not been exercising recently. We will over his medications which are outlined below. He continues in the [employer] trial. He does not report any bleeding problems. His antianginal regimen includes beta blocker, Imdur, and Ranexa.  Labwork from August is outlined below. LDL was 95 with normal LFTs.  Blood pressure today is on the low side, he is asymptomatic. He states that he checks blood pressure at home and usually his systolic is 99991111.  Past Medical History:  Diagnosis Date  . Arthritis    back   . Asthma    Childhood  . Chronic back pain   . Coronary atherosclerosis of native coronary artery    a. CABG 2004 with LIMA-LAD, SVG-D1, SVG-OM, SVG-PDA. b. Cath ~2010 with occ of SVG-diagonal, distal LAD and OM filiing by collaterals. c. NSTEMI 12/2014 s/p DES to SVG-RCA. d. 11/2015: DES to distal graft of the SVG-distal RCA  . Essential hypertension   . Foley catheter in place   . GERD (gastroesophageal reflux disease)   . Hard of hearing   . Hyperglycemia   . Ischemic cardiomyopathy    a. EF 45% by cath 12/2014.  . Mixed hyperlipidemia   . Myocardial infarction   . NSTEMI (non-ST elevated myocardial infarction) ([facility]) 01/01/15  . Pneumonia    hx of   . Prostate cancer [facility])    Radiation therapy 1996  . Shortness of breath dyspnea    with exertion   . Skin cancer   . Stroke [facility])    TIA- 28 years ago     Past Surgical History:  Procedure Laterality Date  . CARDIAC CATHETERIZATION  01/01/2015   Procedure: CORONARY STENT INTERVENTION;  Surgeon: Elijah M Martinique, MD;  Location: [facility];  Service: Cardiovascular;;  SVG to PDA  . CARDIAC CATHETERIZATION N/A 11/21/2015   Procedure: Left Heart Cath and Cors/Grafts Angiography;  Surgeon: Elijah Sine, MD;  Location: [facility];  Service: Cardiovascular;  Laterality: N/A;  . CARDIAC CATHETERIZATION N/A 11/21/2015   Procedure: Coronary Stent Intervention;  Surgeon: Elijah Sine, MD;  Location: [place] [facility];  Service: Cardiovascular;  Laterality: N/A;  . CORONARY ARTERY BYPASS GRAFT  2004   LIMA to LAD, SVG to diagonal, SVG to circumflex, SVG to PDA  . GREEN LIGHT LASER TURP (TRANSURETHRAL RESECTION OF PROSTATE N/A 06/04/2016   Procedure: GREEN LIGHT LASER TURP (TRANSURETHRAL RESECTION OF PROSTATE;  Surgeon: Elijah Seal, MD;  Location: [facility];  Service: Urology;  Laterality: N/A;  . LEFT HEART CATHETERIZATION WITH CORONARY/GRAFT ANGIOGRAM N/A 01/01/2015   Procedure: LEFT HEART CATHETERIZATION WITH [facility]jah [facility]ana;  Surgeon: Elijah M Martinique, MD;  Location: [facility];  Service: Cardiovascular;  Laterality: N/A;  . PERCUTANEOUS CORONARY STENT INTERVENTION (PCI-S)  11/20/2015   distal SVG  with DES       . TONSILLECTOMY      Current Outpatient Prescriptions  Medication Sig Dispense Refill  . AMBULATORY NON FORMULARY MEDICATION Take 90 mg by mouth 2 (two) times daily.  Medication Name: Brilinta 90 mg BID (TWILIGHT Research Study PROVIDED)    . AMBULATORY NON FORMULARY MEDICATION Take 81 mg by mouth daily. Medication Name: ASA 81 mg Daily OR PLACEBO (TWILIGHT Research study provided)    . amLODipine (NORVASC) 5 MG tablet Take 1 tablet (5 mg total) by mouth daily. 90 tablet 3  . bisacodyl (DULCOLAX) 5 MG EC tablet Take 5-10 mg by mouth daily as needed (constipation). Reported on 05/28/2016    . doxazosin (CARDURA) 4 MG tablet Take 4 mg by mouth daily.     Elijah Santana Kitchen ibuprofen (ADVIL,MOTRIN) 200 MG tablet Take 200 mg by mouth every 6 (six)  hours as needed for moderate pain.    . isosorbide mononitrate (IMDUR) 60 MG 24 hr tablet TAKE 1 TABLET BY MOUTH  EVERY MORNING 90 tablet 3  . metoprolol (LOPRESSOR) 50 MG tablet Take 1 tablet (50 mg total) by mouth 2 (two) times daily. 180 tablet 3  . nitroGLYCERIN (NITROSTAT) 0.4 MG SL tablet PLACE ONE (1) TABLET UNDER TONGUE EVERY 5 MINUTES UP TO (3) DOSES AS NEEDED FOR CHEST PAIN. 25 tablet 3  . pantoprazole (PROTONIX) 40 MG tablet Take 1 tablet (40 mg total) by mouth daily. 90 tablet 3  . PRESCRIPTION MEDICATION Hormone shots done at Dr. Ralene Muskrat office once every 4 months (for prostrate) - last injection mid December 2016    . ranolazine (RANEXA) 500 MG 12 hr tablet Take 1 tablet (500 mg total) by mouth 2 (two) times daily. 84 tablet 0  . simvastatin (ZOCOR) 10 MG tablet Take 1 tablet (10 mg total) by mouth daily. 90 tablet 3  . alfuzosin (UROXATRAL) 10 MG 24 hr tablet Take 1 tablet by mouth daily.     No current facility-administered medications for this visit.    Allergies:  Review of patient's allergies indicates no known allergies.   Social History: The patient  reports that he quit smoking about 30 years ago. His smoking use included Cigars. He started smoking about 52 years ago. He has a 13.50 pack-year smoking history. He quit smokeless tobacco use about 6 years ago. His smokeless tobacco use included Chew. He reports that he does not drink alcohol or use drugs.   ROS:  Please see the history of present illness. Otherwise, complete review of systems is positive for decreased hearing, arthritic stiffness.  All other systems are reviewed and negative.   Physical Exam: VS:  BP 93/61   Pulse 68   Ht 5\' 8"  (1.727 m)   Wt 219 lb (99.3 kg)   BMI 33.30 kg/m , BMI Body mass index is 33.3 kg/m.  Wt Readings from Last 3 Encounters:  08/26/16 219 lb (99.3 kg)  06/04/16 210 lb (95.3 kg)  06/04/16 211 lb (95.7 kg)    General: Patient appears comfortable at rest. HEENT: Conjunctiva and  lids normal, oropharynx clear. Neck: Supple, no elevated JVP or carotid bruits, no thyromegaly. Lungs: Clear to auscultation, nonlabored breathing at rest. Cardiac: Regular rate and rhythm, no S3 or significant systolic murmur, no pericardial rub. Abdomen: Soft, nontender, bowel sounds present. Extremities: No pitting edema, distal pulses 2+. Skin: Warm and dry. Musculoskeletal: No kyphosis. Neuropsychiatric: Alert and oriented 3, affect appropriate.  ECG: I personally reviewed the tracing from 11/22/2015 which showed sinus rhythm with prolonged PR interval, decreased R wave progression.  Recent Labwork: 05/28/2016: BUN 21; Creatinine, Ser 1.11; Hemoglobin 12.7; Platelets 239; Potassium 4.3; Sodium 139 06/29/2016: ALT 17; AST 13     Component Value Date/Time  CHOL 179 06/29/2016 0823   TRIG 172 (H) 06/29/2016 0823   HDL 50 06/29/2016 0823   CHOLHDL 3.6 06/29/2016 0823   VLDL 34 (H) 06/29/2016 0823   LDLCALC 95 06/29/2016 0823    Other Studies Reviewed Today:  Cardiac catheterization 11/21/2015:  Ramus lesion, 99% stenosed.  Ost LAD lesion, 99% stenosed.  Prox LAD lesion, 100% stenosed.  Prox Cx to Mid Cx lesion, 100% stenosed.  Prox RCA lesion, 100% stenosed.  Ost RCA lesion, 99% stenosed.  SVG was injected is normal in caliber.  There is severe disease in the graft.  SVG was injected is normal in caliber, and is anatomically normal.  SVG was injected .  There is severe disease in the graft.  Origin lesion, 100% stenosed.  LIMA was injected is normal in caliber, and is anatomically normal.  Dist LAD lesion, 100% stenosed.  There is mild left ventricular systolic dysfunction.  Dist Graft lesion, 85% stenosed. Post intervention, there is a 0% residual stenosis.  Mild LV dysfunction with a focal area of severe hypo-to akinesis involving the mid anterolateral wall. Global ejection fraction at 45 to less than 50%. LVEDP 22 mm Hg.  Significant native CAD with a  99% ostial LAD stenosis followed by total occlusion of the proximal LAD; 99% proximal stenosis of a ramus immediate vessel; total occlusion of the circumflex following the first OM branch with faint distal filling of a second marginal branch and total occlusion of the proximal RCA.   Patent vein graft supplying the distal circumflex marginal vessel which then collateralizes the distal LAD and diagonal vessel.  Old occluded vein graft, which had supplied the diagonal vessel.  SVG supplying the distal RCA with previously placed proximal to mid stent that is widely patent, but evidence for a new 85% distal stenosis prior to the distal anastamosis.  Patent LIMA graft supplying the mid LAD and filling the distal LAD ending proximal to the apex. The distal LAD is occluded and there are faint apical LAD collaterals.  Successful percutaneous coronary intervention to the distal SVG 85% stenosis with ultimate insertion of a 2.512 mm Xience Alpine DES stent postdilated to 2.63 mm with the stenosis being reduced to 0%.  Assessment and Plan:  1. Multivessel CAD status post CABG with documented graft disease and DES intervention to the vein graft to the RCA in January of this year. He has stable angina symptoms on medical therapy and continues in the [place] research trial. No changes were made present regimen.  2. Essential hypertension, blood pressure on the low side today. At home he states systolic is been 99991111. Continue to observe without medication adjustment as yet.  3. Hyperlipidemia, on Zocor, recent LDL 95.  4. Ischemic cardiomyopathy with LVEF 45-50%.  Current medicines were reviewed with the patient today.  Disposition: Follow-up with me in 6 months.  Signed, Satira Sark, MD, [facility] 08/26/2016 10:21 AM    [place] at [place], [place], [place] [address] Phone: [phone]; Fax: [phone]

## 2016-08-26 ENCOUNTER — Ambulatory Visit (INDEPENDENT_AMBULATORY_CARE_PROVIDER_SITE_OTHER): Payer: Medicare Other | Admitting: Cardiology

## 2016-08-26 ENCOUNTER — Encounter: Payer: Self-pay | Admitting: Cardiology

## 2016-08-26 VITALS — BP 93/61 | HR 68 | Ht 68.0 in | Wt 219.0 lb

## 2016-08-26 DIAGNOSIS — I25119 Atherosclerotic heart disease of native coronary artery with unspecified angina pectoris: Secondary | ICD-10-CM | POA: Diagnosis not present

## 2016-08-26 DIAGNOSIS — E782 Mixed hyperlipidemia: Secondary | ICD-10-CM | POA: Diagnosis not present

## 2016-08-26 DIAGNOSIS — I255 Ischemic cardiomyopathy: Secondary | ICD-10-CM | POA: Diagnosis not present

## 2016-08-26 DIAGNOSIS — I10 Essential (primary) hypertension: Secondary | ICD-10-CM | POA: Diagnosis not present

## 2016-08-26 MED ORDER — RANOLAZINE ER 500 MG PO TB12: 500.0000 mg | ORAL_TABLET | Freq: Two times a day (BID) | ORAL | 0 refills | Status: DC

## 2016-08-26 NOTE — Patient Instructions (Signed)

## 2016-09-02 ENCOUNTER — Encounter: Payer: Self-pay | Admitting: Cardiology

## 2016-09-08 ENCOUNTER — Telehealth: Payer: Self-pay | Admitting: Cardiology

## 2016-09-08 NOTE — Telephone Encounter (Signed)
Elijah Santana went to [facility] today and he mentioned to the nurse stating that he has been having chest pains off and on for a week now. States that he has been taking his Nitro. Stated to the office that he did not want to go to the ER

## 2016-09-08 NOTE — Telephone Encounter (Signed)
Noted. We do not have much room to further up titrate his antianginals. He is already on Lopressor, Imdur, Norvasc, and Ranexa. If his symptoms do escalate, he should go to the ER.

## 2016-09-08 NOTE — Telephone Encounter (Signed)
Pt says he has been having chest pain "for many years" has been taking NTG 2-3 times weekly - denies dizziness/swelling does have some SOB when he walk a long distances - says Dr. Woody Seller told him to call out office regarding chest pain - pt says he already discussed this with Dr. Domenic Polite at 10/11 OV - doesn't feel he needs to go to ED. Says he hasn't had chest pain today. Will forward to provider

## 2016-09-08 NOTE — Telephone Encounter (Signed)
Pt agreeable to report to ED if chest pain worsens

## 2016-09-10 ENCOUNTER — Emergency Department (HOSPITAL_COMMUNITY): Payer: Medicare Other

## 2016-09-10 ENCOUNTER — Encounter (HOSPITAL_COMMUNITY): Payer: Self-pay | Admitting: Emergency Medicine

## 2016-09-10 ENCOUNTER — Inpatient Hospital Stay (HOSPITAL_COMMUNITY)
Admission: EM | Admit: 2016-09-10 | Discharge: 2016-09-12 | DRG: 247 | Disposition: A | Discharge status: Home / Self Care | Payer: Medicare Other | Attending: Internal Medicine | Admitting: Internal Medicine

## 2016-09-10 ENCOUNTER — Encounter: Payer: Self-pay | Admitting: *Deleted

## 2016-09-10 DIAGNOSIS — Z79899 Other long term (current) drug therapy: Secondary | ICD-10-CM | POA: Diagnosis not present

## 2016-09-10 DIAGNOSIS — Y832 Surgical operation with anastomosis, bypass or graft as the cause of abnormal reaction of the patient, or of later complication, without mention of misadventure at the time of the procedure: Secondary | ICD-10-CM | POA: Diagnosis present

## 2016-09-10 DIAGNOSIS — H9190 Unspecified hearing loss, unspecified ear: Secondary | ICD-10-CM | POA: Diagnosis present

## 2016-09-10 DIAGNOSIS — I4891 Unspecified atrial fibrillation: Secondary | ICD-10-CM

## 2016-09-10 DIAGNOSIS — I252 Old myocardial infarction: Secondary | ICD-10-CM

## 2016-09-10 DIAGNOSIS — K219 Gastro-esophageal reflux disease without esophagitis: Secondary | ICD-10-CM | POA: Diagnosis present

## 2016-09-10 DIAGNOSIS — I255 Ischemic cardiomyopathy: Secondary | ICD-10-CM | POA: Diagnosis not present

## 2016-09-10 DIAGNOSIS — Z006 Encounter for examination for normal comparison and control in clinical research program: Secondary | ICD-10-CM

## 2016-09-10 DIAGNOSIS — Z8249 Family history of ischemic heart disease and other diseases of the circulatory system: Secondary | ICD-10-CM | POA: Diagnosis not present

## 2016-09-10 DIAGNOSIS — I10 Essential (primary) hypertension: Secondary | ICD-10-CM | POA: Diagnosis not present

## 2016-09-10 DIAGNOSIS — Z951 Presence of aortocoronary bypass graft: Secondary | ICD-10-CM

## 2016-09-10 DIAGNOSIS — Z9861 Coronary angioplasty status: Secondary | ICD-10-CM | POA: Diagnosis not present

## 2016-09-10 DIAGNOSIS — Z8673 Personal history of transient ischemic attack (TIA), and cerebral infarction without residual deficits: Secondary | ICD-10-CM | POA: Diagnosis not present

## 2016-09-10 DIAGNOSIS — E782 Mixed hyperlipidemia: Secondary | ICD-10-CM | POA: Diagnosis present

## 2016-09-10 DIAGNOSIS — I2510 Atherosclerotic heart disease of native coronary artery without angina pectoris: Secondary | ICD-10-CM

## 2016-09-10 DIAGNOSIS — Z8546 Personal history of malignant neoplasm of prostate: Secondary | ICD-10-CM | POA: Diagnosis not present

## 2016-09-10 DIAGNOSIS — I25110 Atherosclerotic heart disease of native coronary artery with unstable angina pectoris: Secondary | ICD-10-CM | POA: Diagnosis present

## 2016-09-10 DIAGNOSIS — Z923 Personal history of irradiation: Secondary | ICD-10-CM | POA: Diagnosis not present

## 2016-09-10 DIAGNOSIS — N400 Enlarged prostate without lower urinary tract symptoms: Secondary | ICD-10-CM | POA: Diagnosis not present

## 2016-09-10 DIAGNOSIS — Z87891 Personal history of nicotine dependence: Secondary | ICD-10-CM | POA: Diagnosis not present

## 2016-09-10 DIAGNOSIS — Z85828 Personal history of other malignant neoplasm of skin: Secondary | ICD-10-CM

## 2016-09-10 DIAGNOSIS — T82898A Other specified complication of vascular prosthetic devices, implants and grafts, initial encounter: Secondary | ICD-10-CM | POA: Diagnosis present

## 2016-09-10 DIAGNOSIS — I480 Paroxysmal atrial fibrillation: Secondary | ICD-10-CM | POA: Diagnosis not present

## 2016-09-10 DIAGNOSIS — R079 Chest pain, unspecified: Secondary | ICD-10-CM

## 2016-09-10 DIAGNOSIS — I200 Unstable angina: Secondary | ICD-10-CM | POA: Diagnosis present

## 2016-09-10 DIAGNOSIS — I214 Non-ST elevation (NSTEMI) myocardial infarction: Secondary | ICD-10-CM

## 2016-09-10 LAB — CBC
HEMATOCRIT: 41.2 % (ref 39.0–52.0)
Hemoglobin: 13.5 g/dL (ref 13.0–17.0)
MCH: 29.7 pg (ref 26.0–34.0)
MCHC: 32.8 g/dL (ref 30.0–36.0)
MCV: 90.5 fL (ref 78.0–100.0)
Platelets: 226 10*3/uL (ref 150–400)
RBC: 4.55 MIL/uL (ref 4.22–5.81)
RDW: 14.1 % (ref 11.5–15.5)
WBC: 5.9 10*3/uL (ref 4.0–10.5)

## 2016-09-10 LAB — COMPREHENSIVE METABOLIC PANEL WITH GFR
ALT: 24 U/L (ref 17–63)
AST: 21 U/L (ref 15–41)
Albumin: 4.3 g/dL (ref 3.5–5.0)
Alkaline Phosphatase: 50 U/L (ref 38–126)
Anion gap: 7 (ref 5–15)
BUN: 24 mg/dL — ABNORMAL HIGH (ref 6–20)
CO2: 23 mmol/L (ref 22–32)
Calcium: 9.2 mg/dL (ref 8.9–10.3)
Chloride: 107 mmol/L (ref 101–111)
Creatinine, Ser: 1.08 mg/dL (ref 0.61–1.24)
GFR calc Af Amer: >60 mL/min
GFR calc non Af Amer: >60 mL/min
Glucose, Bld: 115 mg/dL — ABNORMAL HIGH (ref 65–99)
Potassium: 3.8 mmol/L (ref 3.5–5.1)
Sodium: 137 mmol/L (ref 135–145)
Total Bilirubin: 0.7 mg/dL (ref 0.3–1.2)
Total Protein: 7.2 g/dL (ref 6.5–8.1)

## 2016-09-10 LAB — TROPONIN I: Troponin I: <0.03 ng/mL

## 2016-09-10 MED ORDER — HEPARIN (PORCINE) IN NACL 100-0.45 UNIT/ML-% IJ SOLN
1250.0000 [IU]/h | INTRAMUSCULAR | Status: DC
  Administered 2016-09-10: 1250 [IU]/h via INTRAVENOUS
  Filled 2016-09-10: qty 250

## 2016-09-10 MED ORDER — ONDANSETRON HCL 4 MG/2ML IJ SOLN: 4.0000 mg | Freq: Four times a day (QID) | INTRAMUSCULAR | Status: DC | PRN

## 2016-09-10 MED ORDER — SODIUM CHLORIDE 0.9% FLUSH: 3.0000 mL | Freq: Two times a day (BID) | INTRAVENOUS | Status: DC

## 2016-09-10 MED ORDER — DILTIAZEM HCL 25 MG/5ML IV SOLN
10.0000 mg | Freq: Once | INTRAVENOUS | Status: AC
  Administered 2016-09-10: 10 mg via INTRAVENOUS
  Filled 2016-09-10: qty 5

## 2016-09-10 MED ORDER — SIMVASTATIN 20 MG PO TABS
10.0000 mg | ORAL_TABLET | Freq: Every day | ORAL | Status: DC
  Administered 2016-09-11: 10 mg via ORAL
  Filled 2016-09-10: qty 1

## 2016-09-10 MED ORDER — ISOSORBIDE MONONITRATE ER 60 MG PO TB24
60.0000 mg | ORAL_TABLET | Freq: Every morning | ORAL | Status: DC
  Administered 2016-09-11 – 2016-09-12 (×2): 60 mg via ORAL
  Filled 2016-09-10 (×2): qty 1

## 2016-09-10 MED ORDER — HEPARIN BOLUS VIA INFUSION
4000.0000 [IU] | Freq: Once | INTRAVENOUS | Status: AC
  Administered 2016-09-10: 4000 [IU] via INTRAVENOUS

## 2016-09-10 MED ORDER — ASPIRIN EC 81 MG PO TBEC: 81.0000 mg | DELAYED_RELEASE_TABLET | Freq: Every day | ORAL | Status: DC

## 2016-09-10 MED ORDER — ASPIRIN 81 MG PO CHEW: 324.0000 mg | CHEWABLE_TABLET | ORAL | Status: DC

## 2016-09-10 MED ORDER — RANOLAZINE ER 500 MG PO TB12
500.0000 mg | ORAL_TABLET | Freq: Two times a day (BID) | ORAL | Status: DC
  Administered 2016-09-11 – 2016-09-12 (×4): 500 mg via ORAL
  Filled 2016-09-10 (×4): qty 1

## 2016-09-10 MED ORDER — METOPROLOL TARTRATE 25 MG PO TABS
50.0000 mg | ORAL_TABLET | Freq: Two times a day (BID) | ORAL | Status: DC
  Administered 2016-09-11 – 2016-09-12 (×4): 50 mg via ORAL
  Filled 2016-09-10: qty 2
  Filled 2016-09-10: qty 1
  Filled 2016-09-10: qty 2
  Filled 2016-09-10: qty 1

## 2016-09-10 MED ORDER — ACETAMINOPHEN 325 MG PO TABS: 650.0000 mg | ORAL_TABLET | ORAL | Status: DC | PRN

## 2016-09-10 MED ORDER — TICAGRELOR 90 MG PO TABS
90.0000 mg | ORAL_TABLET | Freq: Two times a day (BID) | ORAL | Status: DC
  Administered 2016-09-11 (×3): 90 mg via ORAL
  Filled 2016-09-10 (×3): qty 1

## 2016-09-10 MED ORDER — NITROGLYCERIN 0.4 MG SL SUBL: 0.4000 mg | SUBLINGUAL_TABLET | SUBLINGUAL | Status: DC | PRN

## 2016-09-10 MED ORDER — ALFUZOSIN HCL ER 10 MG PO TB24
10.0000 mg | ORAL_TABLET | Freq: Every day | ORAL | Status: DC
  Administered 2016-09-11 – 2016-09-12 (×2): 10 mg via ORAL
  Filled 2016-09-10 (×2): qty 1

## 2016-09-10 MED ORDER — ASPIRIN EC 81 MG PO TBEC
81.0000 mg | DELAYED_RELEASE_TABLET | Freq: Every day | ORAL | Status: DC
  Administered 2016-09-12: 81 mg via ORAL
  Filled 2016-09-10: qty 1

## 2016-09-10 MED ORDER — ASPIRIN 300 MG RE SUPP: 300.0000 mg | RECTAL | Status: DC

## 2016-09-10 MED ORDER — SODIUM CHLORIDE 0.9% FLUSH
3.0000 mL | INTRAVENOUS | Status: DC | PRN
Start: 2016-09-10 — End: 2016-09-12

## 2016-09-10 MED ORDER — BISACODYL 5 MG PO TBEC: 5.0000 mg | DELAYED_RELEASE_TABLET | Freq: Every day | ORAL | Status: DC | PRN

## 2016-09-10 MED ORDER — DILTIAZEM HCL 100 MG IV SOLR
5.0000 mg/h | INTRAVENOUS | Status: DC
  Administered 2016-09-10: 15 mg/h via INTRAVENOUS
  Administered 2016-09-10: 10 mg/h via INTRAVENOUS
  Administered 2016-09-11: 15 mg/h via INTRAVENOUS

## 2016-09-10 MED ORDER — PANTOPRAZOLE SODIUM 40 MG PO TBEC
40.0000 mg | DELAYED_RELEASE_TABLET | Freq: Every day | ORAL | Status: DC
  Administered 2016-09-11 – 2016-09-12 (×2): 40 mg via ORAL
  Filled 2016-09-10 (×2): qty 1

## 2016-09-10 MED ORDER — ASPIRIN 81 MG PO CHEW
324.0000 mg | CHEWABLE_TABLET | Freq: Once | ORAL | Status: AC
  Administered 2016-09-10: 324 mg via ORAL
  Filled 2016-09-10: qty 4

## 2016-09-10 MED ORDER — DOXAZOSIN MESYLATE 4 MG PO TABS
4.0000 mg | ORAL_TABLET | Freq: Every day | ORAL | Status: DC
  Administered 2016-09-11 – 2016-09-12 (×2): 4 mg via ORAL
  Filled 2016-09-10 (×2): qty 1

## 2016-09-10 MED ORDER — DILTIAZEM HCL 100 MG IV SOLR
INTRAVENOUS | Status: AC
  Administered 2016-09-10: 10 mg/h via INTRAVENOUS
  Filled 2016-09-10: qty 100

## 2016-09-10 MED ORDER — SODIUM CHLORIDE 0.9 % IV SOLN: 250.0000 mL | INTRAVENOUS | Status: DC | PRN

## 2016-09-10 NOTE — ED Notes (Signed)
Per Dr. Cathleen Fears, this RN is to titrate cardizem every 5 minutes by 5 mg/hr until heart rate is better controlled.

## 2016-09-10 NOTE — ED Triage Notes (Signed)
Pt reports chest pain with onset of 1 hour prior to arrival with radiation down left arm.  Initially denied shortness of breath, but then states he does have some.  Pt took 4 nitro prior to arrival with no relief.

## 2016-09-10 NOTE — Progress Notes (Signed)
ANTICOAGULATION CONSULT NOTE - Initial Consult  Pharmacy Consult for heparin Indication: atrial fibrillation  No Known Allergies  Patient Measurements: Height: 5\' 8"  (172.7 cm) Weight: 215 lb (97.5 kg) IBW/kg (Calculated) : 68.4 Heparin Dosing Weight: 89 kg  Vital Signs: Temp: 98 F (36.7 C) (10/26 1754) Temp Source: Oral (10/26 1754) BP: 120/75 (10/26 2015) Pulse Rate: 80 (10/26 2015)  Labs:  Recent Labs  09/10/16 1825  HGB 13.5  HCT 41.2  PLT 226  CREATININE 1.08  TROPONINI <0.03    Estimated Creatinine Clearance: 59.7 mL/min (by C-G formula based on SCr of 1.08 mg/dL).   Medical History: Past Medical History:  Diagnosis Date  . Arthritis    back   . Asthma    Childhood  . Chronic back pain   . Coronary atherosclerosis of native coronary artery    a. CABG 2004 with LIMA-LAD, SVG-D1, SVG-OM, SVG-PDA. b. Cath ~2010 with occ of SVG-diagonal, distal LAD and OM filiing by collaterals. c. NSTEMI 12/2014 s/p DES to SVG-RCA. d. 11/2015: DES to distal graft of the SVG-distal RCA  . Essential hypertension   . Foley catheter in place   . GERD (gastroesophageal reflux disease)   . Hard of hearing   . Hyperglycemia   . Ischemic cardiomyopathy    a. EF 45% by cath 12/2014.  . Mixed hyperlipidemia   . Myocardial infarction   . NSTEMI (non-ST elevated myocardial infarction) ([place]) 01/01/15  . Pneumonia    hx of   . Prostate cancer [facility])    Radiation therapy 1996  . Skin cancer   . Stroke [facility])    TIA- 28 years ago     Medications:  See medication history  Assessment: 82 yo man to start heparin for afib.  He was not on anticoagulation prior to admission.   Goal of Therapy:  Heparin level 0.3-0.7 units/ml Monitor platelets by anticoagulation protocol: Yes   Plan:  Heparin bolus 4000 units and drip at 1250 units/hr Check heparin level 8 hours after start Daily heparin level and CBC while on heparin Monitor for bleeding complications  Valari Taylor  [place] 09/10/2016,8:27 PM

## 2016-09-10 NOTE — Progress Notes (Signed)
TWILIGHT Research study month 9 visit completed. Patient did have a cessation of Brilinta on 05-30-16 through 06-05-16 for Prostate surgery. No bleeding events or other adverse events noted. After pill count his compliance was 95.24 % for ASA/Placebo and 92.38 % for Brilinta. Next appointment will be no later than 03/15/17. While on the way to his car He mentioned that he had been experencing some Chest pain with exertion and went to see Dr. Domenic Polite , encouraged patient to not hesitate to call cardiology office or 911 if CP reoccurs and to always have ntg with him. Questions encouraged and answered.

## 2016-09-10 NOTE — H&P (Addendum)
Primary Physician:  Woody Seller Primary Cardiologist:  Domenic Polite  Pt presents for eval of CP    HPI: Pt is an 82 yo who has a history of HTN,  CAD, ICM  (EF 45% in 2016), CVA  HL    He has a history of remote CABG 2004 (LIMA to LAD; SVG to D1; SVG to OM; SVG to PDA)  NSTEMI in Feb 2016 with DES to SVG -RCA.  [place] in Jan 2017  Cath at that time showed:  100% prox LAD; 100% prox/mid LCx; 100% prox RCA; 99% ramus.  SVG to distal LCx patent; SVG DIag 100%; SVG to distal RCA:  Stent patent in mid graft  New distal stenosis 85%  LIMA to mid LAD patent.  Distal LAD occluded.  Underent DES to distal SVG to distal RCA/  Remains in Twilight study   He was just seen in cardiology clinic by Myles Gip on Oct 11.  No CP  Not very active    Pt says over the past couple months he has had  CP intermittently,  Taking about 1 to 2 NTG per week  He did not have this last sumer   ON Monday was working in yard  Develop chest discomfort  Easaed after NTG  Tues and Wed felt [employer] himself Today was on 4 wheeler and developed CP  The worst CP he has ever had   Took 4 NTG  Still without complete relief  Went to [facility] ER Denies palpitations  No dizziness  Breathing is stable   Currently with some chest pressure        Past Medical History:  Diagnosis Date  . Arthritis    back   . Asthma    Childhood  . Chronic back pain   . Coronary atherosclerosis of native coronary artery    a. CABG 2004 with LIMA-LAD, SVG-D1, SVG-OM, SVG-PDA. b. Cath ~2010 with occ of SVG-diagonal, distal LAD and OM filiing by collaterals. c. NSTEMI 12/2014 s/p DES to SVG-RCA. d. 11/2015: DES to distal graft of the SVG-distal RCA  . Essential hypertension   . Foley catheter in place   . GERD (gastroesophageal reflux disease)   . Hard of hearing   . Hyperglycemia   . Ischemic cardiomyopathy    a. EF 45% by cath 12/2014.  . Mixed hyperlipidemia   . Myocardial infarction   . NSTEMI (non-ST elevated myocardial infarction) ([place]) 01/01/15  .  Pneumonia    hx of   . Prostate cancer [facility])    Radiation therapy 1996  . Skin cancer   . Stroke [facility])    TIA- 28 years ago      (Not in a hospital admission)   . heparin  4,000 Units Intravenous Once    Infusions: . diltiazem (CARDIZEM) infusion 30 mg/hr (09/10/16 2038)  . heparin      No Known Allergies  Social History   Social History  . Marital status: Married    Spouse name: N/A  . Number of children: N/A  . Years of education: N/A   Occupational History  . Retired     Office manager   Social History Main Topics  . Smoking status: Former Smoker    Packs/day: 0.50    Years: 27.00    Types: Cigars    Start date: 07/28/1964    Quit date: 07/28/1986  . Smokeless tobacco: Former Systems developer    Types: [place] date: 08/07/2010  Comment: never chewed up over a pack/day  . Alcohol use No  . Drug use: No  . Sexual activity: Not on file   Other Topics Concern  . Not on file   Social History Narrative  . No narrative on file    Family History  Problem Relation Age of Onset  . Hypertension Father   . Coronary artery disease Father     REVIEW OF SYSTEMS:  All systems reviewed  Negative to the above problem except as noted above.    PHYSICAL EXAM: Vitals:   09/10/16 2030 09/10/16 2045  BP: 139/86 126/61  Pulse: (!) 48 81  Resp: 11 18  Temp:      No intake or output data in the 24 hours ending 09/10/16 2051  General:  Well appearing. No respiratory difficulty HEENT: normal Neck: supple. no JVD. Carotids 2+ bilat; no bruits. No lymphadenopathy or thryomegaly appreciated. Cor: PMI nondisplaced. Regular rate & rhythm. No rubs, gallops or murmurs. Lungs: clear Abdomen: soft, nontender, nondistended. No hepatosplenomegaly. No bruits or masses. Good bowel sounds. Extremities: no cyanosis, clubbing, rash, edema Neuro: alert & oriented x 3, cranial nerves grossly intact. moves all 4 extremities w/o difficulty. Affect pleasant.  ECG:  Atrial fib  103 bpm  Sl ST depression I, II, AVL, V2 to V6    Results for orders placed or performed during the hospital encounter of 09/10/16 (from the past 24 hour(s))  CBC     Status: None   Collection Time: 09/10/16  6:25 PM  Result Value Ref Range   WBC 5.9 4.0 - 10.5 K/uL   RBC 4.55 4.22 - 5.81 MIL/uL   Hemoglobin 13.5 13.0 - 17.0 g/dL   HCT 41.2 39.0 - 52.0 %   MCV 90.5 78.0 - 100.0 fL   MCH 29.7 26.0 - 34.0 pg   MCHC 32.8 30.0 - 36.0 g/dL   RDW 14.1 11.5 - 15.5 %   Platelets 226 150 - 400 K/uL  Comprehensive metabolic panel     Status: Abnormal   Collection Time: 09/10/16  6:25 PM  Result Value Ref Range   Sodium 137 135 - 145 mmol/L   Potassium 3.8 3.5 - 5.1 mmol/L   Chloride 107 101 - 111 mmol/L   CO2 23 22 - 32 mmol/L   Glucose, Bld 115 (H) 65 - 99 mg/dL   BUN 24 (H) 6 - 20 mg/dL   Creatinine, Ser 1.08 0.61 - 1.24 mg/dL   Calcium 9.2 8.9 - 10.3 mg/dL   Total Protein 7.2 6.5 - 8.1 g/dL   Albumin 4.3 3.5 - 5.0 g/dL   AST 21 15 - 41 U/L   ALT 24 17 - 63 U/L   Alkaline Phosphatase 50 38 - 126 U/L   Total Bilirubin 0.7 0.3 - 1.2 mg/dL   GFR calc non Af Amer >60 >60 mL/min   GFR calc Af Amer >60 >60 mL/min   Anion gap 7 5 - 15  Troponin I     Status: None   Collection Time: 09/10/16  6:25 PM  Result Value Ref Range   Troponin I <0.03 <0.03 ng/mL   Dg Chest Port 1 View  Result Date: 09/10/2016 CLINICAL DATA:  82 y/o M; 2 hours of central chest pain and atrial fibrillation. EXAM: PORTABLE CHEST 1 VIEW COMPARISON:  12/30/2014 chest radiograph. FINDINGS: Cardiomediastinal silhouette is within normal limits and stable. Post median sternotomy with wires in alignment. Clear lungs. No pneumothorax or pleural effusion. No acute osseous abnormality is  evident. IMPRESSION: No acute pulmonary process identified. Electronically Signed   By: Kristine Garbe M.D.   On: 09/10/2016 19:05     ASSESSMENT: Pt is an 82 yo with history of CAD  Over the past few wks has had increasing  spells of CP  Today had worst pain he has ever had   Not relieved with NTG Presented to [facility] ER  Found to be in atrial fib with RVR Denies palpitations   On exam, no evid of CHF  BP 130s   HR 100s to 130s  Atrial fib/ atrial flutter He notes mild chest pressure  Pt was  started on IV diltiazem and heparin  At [facility]   The pt just converted to SR   Atrial Fib  Pt with CHADSVASc score of 6.   Plan:  COntinue telemetry  Follow troponin  COntinue heparin and diltiazem for now  I am concerned he may pop back into atrial fibrillation  Consider amiodarone at that time   Based on pt's response, troponin elevation will determine further invasive procedures  2.  CAD  As noted above  Continue ASA and study drug  With afib and need for anticoagulation will prob drop out of TWILIGHT and use plavix  Defer decisioin to AM  3 HL  Continue simvistatin  4  HTN  FOllow on meds  Stop amlodipine  Use dilt

## 2016-09-10 NOTE — ED Provider Notes (Signed)
[name] Provider Note   CSN: [id number] Arrival date & time: 09/10/16  1745     History   Chief Complaint Chief Complaint  Patient presents with  . Chest Pain    HPI Elijah Santana is a 82 y.o. male.Complains of left anterior chest pain nonradiating typical of "heart pain" onset 1.5 hours ago. Nothing makes pain better or worse. He treated himself with 4 sublingual nitroglycerin without relief. Patient reports that he typically gets chest pain 2-3 times a week which gets better with nitroglycerin. Pain is severe at present.  HPI  Past Medical History:  Diagnosis Date  . Arthritis    back   . Asthma    Childhood  . Chronic back pain   . Coronary atherosclerosis of native coronary artery    a. CABG 2004 with LIMA-LAD, SVG-D1, SVG-OM, SVG-PDA. b. Cath ~2010 with occ of SVG-diagonal, distal LAD and OM filiing by collaterals. c. NSTEMI 12/2014 s/p DES to SVG-RCA. d. 11/2015: DES to distal graft of the SVG-distal RCA  . Essential hypertension   . Foley catheter in place   . GERD (gastroesophageal reflux disease)   . Hard of hearing   . Hyperglycemia   . Ischemic cardiomyopathy    a. EF 45% by cath 12/2014.  . Mixed hyperlipidemia   . Myocardial infarction   . NSTEMI (non-ST elevated myocardial infarction) ([place]) 01/01/15  . Pneumonia    hx of   . Prostate cancer [facility])    Radiation therapy 1996  . Skin cancer   . Stroke [facility])    TIA- 28 years ago     Patient Active Problem List   Diagnosis Date Noted  . CAD in native artery 11/21/2015  . Accelerating angina ([place])   . Coronary artery disease involving coronary bypass graft of native heart with unstable angina pectoris ([place])   . CAD (coronary artery disease) 03/21/2015  . Ischemic cardiomyopathy   . Hyperglycemia   . Essential hypertension   . BPH (benign prostatic hyperplasia)   . S/P CABG (coronary artery bypass graft) 12/31/2014  . GERD (gastroesophageal reflux disease) 04/29/2012  . Mixed hyperlipidemia     . Essential hypertension, benign 08/06/2009  . CORONARY ATHEROSCLEROSIS NATIVE CORONARY ARTERY 08/06/2009    Past Surgical History:  Procedure Laterality Date  . CARDIAC CATHETERIZATION  01/01/2015   Procedure: CORONARY STENT INTERVENTION;  Surgeon: Peter M Martinique, MD;  Location: [facility] CATH LAB;  Service: Cardiovascular;;  SVG to PDA  . CARDIAC CATHETERIZATION N/A 11/21/2015   Procedure: Left Heart Cath and Cors/Grafts Angiography;  Surgeon: Troy Sine, MD;  Location: [place] CV LAB;  Service: Cardiovascular;  Laterality: N/A;  . CARDIAC CATHETERIZATION N/A 11/21/2015   Procedure: Coronary Stent Intervention;  Surgeon: Troy Sine, MD;  Location: [place] CV LAB;  Service: Cardiovascular;  Laterality: N/A;  . CORONARY ARTERY BYPASS GRAFT  2004   LIMA to LAD, SVG to diagonal, SVG to circumflex, SVG to PDA  . GREEN LIGHT LASER TURP (TRANSURETHRAL RESECTION OF PROSTATE N/A 06/04/2016   Procedure: GREEN LIGHT LASER TURP (TRANSURETHRAL RESECTION OF PROSTATE;  Surgeon: Irine Seal, MD;  Location: [facility] ORS;  Service: Urology;  Laterality: N/A;  . LEFT HEART CATHETERIZATION WITH CORONARY/GRAFT ANGIOGRAM N/A 01/01/2015   Procedure: LEFT HEART CATHETERIZATION WITH Beatrix Fetters;  Surgeon: Peter M Martinique, MD;  Location: [facility] CATH LAB;  Service: Cardiovascular;  Laterality: N/A;  . PERCUTANEOUS CORONARY STENT INTERVENTION (PCI-S)  11/20/2015   distal SVG  with DES       .  TONSILLECTOMY         Home Medications    Prior to Admission medications   Medication Sig Start Date End Date Taking? Authorizing Provider  alfuzosin (UROXATRAL) 10 MG 24 hr tablet Take 1 tablet by mouth daily. 08/18/16   Historical Provider, MD  AMBULATORY NON FORMULARY MEDICATION Take 90 mg by mouth 2 (two) times daily. Medication Name: Brilinta 90 mg BID (TWILIGHT Research Study PROVIDED) 11/22/15   Burnell Blanks, MD  AMBULATORY NON FORMULARY MEDICATION Take 81 mg by mouth daily. Medication Name: ASA 81 mg  Daily OR [place] (TWILIGHT Research study provided) 02/13/16   Burnell Blanks, MD  amLODipine (NORVASC) 5 MG tablet Take 1 tablet (5 mg total) by mouth daily. 01/17/16   Satira Sark, MD  bisacodyl (DULCOLAX) 5 MG EC tablet Take 5-10 mg by mouth daily as needed (constipation). Reported on 05/28/2016    Historical Provider, MD  doxazosin (CARDURA) 4 MG tablet Take 4 mg by mouth daily.     Historical Provider, MD  ibuprofen (ADVIL,MOTRIN) 200 MG tablet Take 200 mg by mouth every 6 (six) hours as needed for moderate pain.    Historical Provider, MD  isosorbide mononitrate (IMDUR) 60 MG 24 hr tablet TAKE 1 TABLET BY MOUTH  EVERY MORNING 08/19/16   Satira Sark, MD  metoprolol (LOPRESSOR) 50 MG tablet Take 1 tablet (50 mg total) by mouth 2 (two) times daily. 03/13/16   Satira Sark, MD  nitroGLYCERIN (NITROSTAT) 0.4 MG SL tablet PLACE ONE (1) TABLET UNDER TONGUE EVERY 5 MINUTES UP TO (3) DOSES AS NEEDED FOR CHEST PAIN. 11/22/15   Erma Heritage, PA  pantoprazole (PROTONIX) 40 MG tablet Take 1 tablet (40 mg total) by mouth daily. 03/12/16   Satira Sark, MD  PRESCRIPTION MEDICATION Hormone shots done at Dr. Ralene Muskrat office once every 4 months (for prostrate) - last injection mid December 2016    Historical Provider, MD  ranolazine (RANEXA) 500 MG 12 hr tablet Take 1 tablet (500 mg total) by mouth 2 (two) times daily. 08/26/16   Satira Sark, MD  simvastatin (ZOCOR) 10 MG tablet Take 1 tablet (10 mg total) by mouth daily. 03/20/16   Satira Sark, MD    Family History Family History  Problem Relation Age of Onset  . Hypertension Father   . Coronary artery disease Father     Social History Social History  Substance Use Topics  . Smoking status: Former Smoker    Packs/day: 0.50    Years: 27.00    Types: Cigars    Start date: 07/28/1964    Quit date: 07/28/1986  . Smokeless tobacco: Former Systems developer    Types: Chew    Quit date: 08/07/2010     Comment: never chewed up over  a pack/day  . Alcohol use No     Allergies   Review of patient's allergies indicates no known allergies.   Review of Systems Review of Systems  Constitutional: Negative.   HENT: Negative.   Respiratory: Negative.   Cardiovascular: Positive for chest pain.  Gastrointestinal: Negative.   Musculoskeletal: Negative.   Skin: Negative.   Neurological: Negative.   Psychiatric/Behavioral: Negative.   All other systems reviewed and are negative.    Physical Exam Updated Vital Signs BP 123/89   Pulse 116   Temp 98 F (36.7 C) (Oral)   Resp 15   Ht 5\' 8"  (1.727 m)   Wt 215 lb (97.5 kg)   SpO2 97%  BMI 32.69 kg/m   Physical Exam  Constitutional: He appears well-developed and well-nourished. He appears distressed.  Appears uncomfortable  HENT:  Head: Normocephalic and atraumatic.  Eyes: Conjunctivae are normal. Pupils are equal, round, and reactive to light.  Neck: Neck supple. No tracheal deviation present. No thyromegaly present.  Cardiovascular:  No murmur heard. Mildly tachycardic irregularly irregular  Pulmonary/Chest: Effort normal and breath sounds normal.  Abdominal: Soft. Bowel sounds are normal. He exhibits no distension. There is no tenderness.  Musculoskeletal: Normal range of motion. He exhibits edema. He exhibits no tenderness.  Plus pretibial pitting edema bilaterally  Neurological: He is alert. Coordination normal.  Skin: Skin is warm and dry. No rash noted.  Psychiatric: He has a normal mood and affect.  Nursing note and vitals reviewed.    ED Treatments / Results  Labs (all labs ordered are listed, but only abnormal results are displayed) Labs Reviewed  CBC  COMPREHENSIVE METABOLIC PANEL  TROPONIN I    EKG  EKG Interpretation  Date/Time:  Thursday September 10 2016 17:48:41 EDT Ventricular Rate:  105 PR Interval:    QRS Duration: 80 QT Interval:  353 QTC Calculation: 467 R Axis:   -29 Text Interpretation:  Atrial fibrillation Borderline  left axis deviation Repol abnrm suggests ischemia, diffuse leads Confirmed by Winfred Leeds  MD, Tyrek Lawhorn [phone]) on 09/10/2016 5:52:10 PM     Chest x-ray viewed by me Results for orders placed or performed during the hospital encounter of 09/10/16  CBC  Result Value Ref Range   WBC 5.9 4.0 - 10.5 K/uL   RBC 4.55 4.22 - 5.81 MIL/uL   Hemoglobin 13.5 13.0 - 17.0 g/dL   HCT 41.2 39.0 - 52.0 %   MCV 90.5 78.0 - 100.0 fL   MCH 29.7 26.0 - 34.0 pg   MCHC 32.8 30.0 - 36.0 g/dL   RDW 14.1 11.5 - 15.5 %   Platelets 226 150 - 400 K/uL  Comprehensive metabolic panel  Result Value Ref Range   Sodium 137 135 - 145 mmol/L   Potassium 3.8 3.5 - 5.1 mmol/L   Chloride 107 101 - 111 mmol/L   CO2 23 22 - 32 mmol/L   Glucose, Bld 115 (H) 65 - 99 mg/dL   BUN 24 (H) 6 - 20 mg/dL   Creatinine, Ser 1.08 0.61 - 1.24 mg/dL   Calcium 9.2 8.9 - 10.3 mg/dL   Total Protein 7.2 6.5 - 8.1 g/dL   Albumin 4.3 3.5 - 5.0 g/dL   AST 21 15 - 41 U/L   ALT 24 17 - 63 U/L   Alkaline Phosphatase 50 38 - 126 U/L   Total Bilirubin 0.7 0.3 - 1.2 mg/dL   GFR calc non Af Amer >60 >60 mL/min   GFR calc Af Amer >60 >60 mL/min   Anion gap 7 5 - 15  Troponin I  Result Value Ref Range   Troponin I <0.03 <0.03 ng/mL   Dg Chest Port 1 View  Result Date: 09/10/2016 CLINICAL DATA:  82 y/o M; 2 hours of central chest pain and atrial fibrillation. EXAM: PORTABLE CHEST 1 VIEW COMPARISON:  12/30/2014 chest radiograph. FINDINGS: Cardiomediastinal silhouette is within normal limits and stable. Post median sternotomy with wires in alignment. Clear lungs. No pneumothorax or pleural effusion. No acute osseous abnormality is evident. IMPRESSION: No acute pulmonary process identified. Electronically Signed   By: Kristine Garbe M.D.   On: 09/10/2016 19:05   6:45 PM patient feels slightly improved after treatment with intravenous  Cardizem. Heart rate 1 20 bpm, atrial fibrillation. Intravenous Cardizem drip ordered. Aspirin  ordered. Radiology No results found.  Procedures Procedures (including critical care time)  Medications Ordered in ED Medications  diltiazem (CARDIZEM) injection 10 mg (not administered)  aspirin chewable tablet 324 mg (not administered)     Initial Impression / Assessment and Plan / ED Course  I have reviewed the triage vital signs and the nursing notes.  Pertinent labs & imaging results that were available during my care of the patient were reviewed by me and considered in my medical decision making (see chart for details). 8:35 PM discomfort continues to improve on intravenous Cardizem drip. Dr. Harrington Challenger consulted by telephone. She says patient in transfer to [facility] stepdown unit Patient exhibiting symptoms of angina, likely rate related however he has extensive coronary disease. Intravenous heparin ordered per pharmacy protocol Clinical Course      Final Clinical Impressions(s) / ED Diagnoses  Diagnoses #1 atrial fibrillation with rapid ventricular response #2 unstable angina Final diagnoses:  None   CRITICAL CARE Performed by: Orlie Dakin Total critical care time: 40 minutes Critical care time was exclusive of separately billable procedures and treating other patients. Critical care was necessary to treat or prevent imminent or life-threatening deterioration. Critical care was time spent personally by me on the following activities: development of treatment plan with patient and/or surrogate as well as nursing, discussions with consultants, evaluation of patient's response to treatment, examination of patient, obtaining history from patient or surrogate, ordering and performing treatments and interventions, ordering and review of laboratory studies, ordering and review of radiographic studies, pulse oximetry and re-evaluation of patient's condition. New Prescriptions New Prescriptions   No medications on file     Orlie Dakin, MD 09/10/16  2043

## 2016-09-11 ENCOUNTER — Encounter (HOSPITAL_COMMUNITY)
Admission: EM | Disposition: A | Discharge status: Home / Self Care | Payer: Self-pay | Source: Home / Self Care | Attending: Internal Medicine

## 2016-09-11 DIAGNOSIS — Z8249 Family history of ischemic heart disease and other diseases of the circulatory system: Secondary | ICD-10-CM | POA: Diagnosis not present

## 2016-09-11 DIAGNOSIS — E782 Mixed hyperlipidemia: Secondary | ICD-10-CM | POA: Diagnosis present

## 2016-09-11 DIAGNOSIS — I252 Old myocardial infarction: Secondary | ICD-10-CM | POA: Diagnosis not present

## 2016-09-11 DIAGNOSIS — N400 Enlarged prostate without lower urinary tract symptoms: Secondary | ICD-10-CM | POA: Diagnosis present

## 2016-09-11 DIAGNOSIS — I214 Non-ST elevation (NSTEMI) myocardial infarction: Secondary | ICD-10-CM | POA: Diagnosis present

## 2016-09-11 DIAGNOSIS — Z8546 Personal history of malignant neoplasm of prostate: Secondary | ICD-10-CM | POA: Diagnosis not present

## 2016-09-11 DIAGNOSIS — Z923 Personal history of irradiation: Secondary | ICD-10-CM | POA: Diagnosis not present

## 2016-09-11 DIAGNOSIS — I480 Paroxysmal atrial fibrillation: Secondary | ICD-10-CM

## 2016-09-11 DIAGNOSIS — I25720 Atherosclerosis of autologous artery coronary artery bypass graft(s) with unstable angina pectoris: Secondary | ICD-10-CM | POA: Diagnosis not present

## 2016-09-11 DIAGNOSIS — Z79899 Other long term (current) drug therapy: Secondary | ICD-10-CM | POA: Diagnosis not present

## 2016-09-11 DIAGNOSIS — I255 Ischemic cardiomyopathy: Secondary | ICD-10-CM | POA: Diagnosis present

## 2016-09-11 DIAGNOSIS — Z9861 Coronary angioplasty status: Secondary | ICD-10-CM | POA: Diagnosis not present

## 2016-09-11 DIAGNOSIS — I10 Essential (primary) hypertension: Secondary | ICD-10-CM | POA: Diagnosis present

## 2016-09-11 DIAGNOSIS — Z85828 Personal history of other malignant neoplasm of skin: Secondary | ICD-10-CM | POA: Diagnosis not present

## 2016-09-11 DIAGNOSIS — R079 Chest pain, unspecified: Secondary | ICD-10-CM | POA: Diagnosis present

## 2016-09-11 DIAGNOSIS — I4891 Unspecified atrial fibrillation: Secondary | ICD-10-CM | POA: Diagnosis not present

## 2016-09-11 DIAGNOSIS — Z951 Presence of aortocoronary bypass graft: Secondary | ICD-10-CM | POA: Diagnosis not present

## 2016-09-11 DIAGNOSIS — H9190 Unspecified hearing loss, unspecified ear: Secondary | ICD-10-CM | POA: Diagnosis present

## 2016-09-11 DIAGNOSIS — I200 Unstable angina: Secondary | ICD-10-CM | POA: Diagnosis not present

## 2016-09-11 DIAGNOSIS — T82898A Other specified complication of vascular prosthetic devices, implants and grafts, initial encounter: Secondary | ICD-10-CM | POA: Diagnosis present

## 2016-09-11 DIAGNOSIS — I25110 Atherosclerotic heart disease of native coronary artery with unstable angina pectoris: Secondary | ICD-10-CM | POA: Diagnosis present

## 2016-09-11 DIAGNOSIS — Z8673 Personal history of transient ischemic attack (TIA), and cerebral infarction without residual deficits: Secondary | ICD-10-CM | POA: Diagnosis not present

## 2016-09-11 DIAGNOSIS — I2510 Atherosclerotic heart disease of native coronary artery without angina pectoris: Secondary | ICD-10-CM | POA: Diagnosis not present

## 2016-09-11 DIAGNOSIS — Y832 Surgical operation with anastomosis, bypass or graft as the cause of abnormal reaction of the patient, or of later complication, without mention of misadventure at the time of the procedure: Secondary | ICD-10-CM | POA: Diagnosis present

## 2016-09-11 DIAGNOSIS — Z87891 Personal history of nicotine dependence: Secondary | ICD-10-CM | POA: Diagnosis not present

## 2016-09-11 DIAGNOSIS — K219 Gastro-esophageal reflux disease without esophagitis: Secondary | ICD-10-CM | POA: Diagnosis present

## 2016-09-11 HISTORY — PX: CARDIAC CATHETERIZATION: SHX172

## 2016-09-11 LAB — POCT ACTIVATED CLOTTING TIME
ACTIVATED CLOTTING TIME: 274 s
Activated Clotting Time: 241 seconds
Activated Clotting Time: 329 seconds

## 2016-09-11 LAB — CBC
HEMATOCRIT: 39.1 % (ref 39.0–52.0)
Hemoglobin: 12.7 g/dL — ABNORMAL LOW (ref 13.0–17.0)
MCH: 29.6 pg (ref 26.0–34.0)
MCHC: 32.5 g/dL (ref 30.0–36.0)
MCV: 91.1 fL (ref 78.0–100.0)
PLATELETS: 177 10*3/uL (ref 150–400)
RBC: 4.29 MIL/uL (ref 4.22–5.81)
RDW: 14.5 % (ref 11.5–15.5)
WBC: 7.7 10*3/uL (ref 4.0–10.5)

## 2016-09-11 LAB — BASIC METABOLIC PANEL
Anion gap: 11 (ref 5–15)
BUN: 17 mg/dL (ref 6–20)
CALCIUM: 8.9 mg/dL (ref 8.9–10.3)
CO2: 22 mmol/L (ref 22–32)
Chloride: 108 mmol/L (ref 101–111)
Creatinine, Ser: 1 mg/dL (ref 0.61–1.24)
GFR calc Af Amer: >60 mL/min (ref >60)
GLUCOSE: 128 mg/dL — AB (ref 65–99)
Potassium: 3.6 mmol/L (ref 3.5–5.1)
Sodium: 141 mmol/L (ref 135–145)

## 2016-09-11 LAB — LIPID PANEL
Cholesterol: 167 mg/dL (ref 0–200)
HDL: 38 mg/dL — ABNORMAL LOW (ref >40)
LDL Cholesterol: 86 mg/dL (ref 0–99)
Total CHOL/HDL Ratio: 4.4 RATIO
Triglycerides: 214 mg/dL — ABNORMAL HIGH (ref <150)
VLDL: 43 mg/dL — ABNORMAL HIGH (ref 0–40)

## 2016-09-11 LAB — TROPONIN I
TROPONIN I: 6.47 ng/mL — AB (ref <0.03)
TROPONIN I: 4.2 ng/mL — AB (ref <0.03)
Troponin I: 1.13 ng/mL (ref <0.03)

## 2016-09-11 LAB — TSH: TSH: 4.364 u[IU]/mL (ref 0.350–4.500)

## 2016-09-11 LAB — HEPARIN LEVEL (UNFRACTIONATED)
Heparin Unfractionated: 0.35 IU/mL (ref 0.30–0.70)
Heparin Unfractionated: 0.37 IU/mL (ref 0.30–0.70)

## 2016-09-11 LAB — MRSA PCR SCREENING: MRSA by PCR: NEGATIVE

## 2016-09-11 LAB — PROTIME-INR
INR: 1.13
PROTHROMBIN TIME: 14.5 s (ref 11.4–15.2)

## 2016-09-11 SURGERY — LEFT HEART CATH AND CORS/GRAFTS ANGIOGRAPHY
Anesthesia: LOCAL

## 2016-09-11 MED ORDER — VERAPAMIL HCL 2.5 MG/ML IV SOLN
INTRAVENOUS | Status: DC | PRN
  Administered 2016-09-11: 10 mL via INTRA_ARTERIAL

## 2016-09-11 MED ORDER — HEPARIN SODIUM (PORCINE) 1000 UNIT/ML IJ SOLN
INTRAMUSCULAR | Status: DC | PRN
  Administered 2016-09-11 (×2): 3000 [IU] via INTRAVENOUS
  Administered 2016-09-11: 5000 [IU] via INTRAVENOUS

## 2016-09-11 MED ORDER — ANGIOPLASTY BOOK
Freq: Once | Status: AC
  Administered 2016-09-11: 20:00:00
  Filled 2016-09-11: qty 1

## 2016-09-11 MED ORDER — HEPARIN (PORCINE) IN NACL 2-0.9 UNIT/ML-% IJ SOLN
INTRAMUSCULAR | Status: AC
  Filled 2016-09-11: qty 1000

## 2016-09-11 MED ORDER — HEPARIN (PORCINE) IN NACL 2-0.9 UNIT/ML-% IJ SOLN
INTRAMUSCULAR | Status: DC | PRN
  Administered 2016-09-11: 1500 mL

## 2016-09-11 MED ORDER — IOPAMIDOL (ISOVUE-370) INJECTION 76%
INTRAVENOUS | Status: AC
  Filled 2016-09-11: qty 125

## 2016-09-11 MED ORDER — OFF THE BEAT BOOK
Freq: Once | Status: DC
  Filled 2016-09-11: qty 1

## 2016-09-11 MED ORDER — AMLODIPINE BESYLATE 5 MG PO TABS
2.5000 mg | ORAL_TABLET | Freq: Every day | ORAL | Status: DC
  Administered 2016-09-12: 10:00:00 2.5 mg via ORAL
  Filled 2016-09-11: qty 1

## 2016-09-11 MED ORDER — HEPARIN SODIUM (PORCINE) 1000 UNIT/ML IJ SOLN
INTRAMUSCULAR | Status: AC
  Filled 2016-09-11: qty 1

## 2016-09-11 MED ORDER — FENTANYL CITRATE (PF) 100 MCG/2ML IJ SOLN
INTRAMUSCULAR | Status: AC
  Filled 2016-09-11: qty 2

## 2016-09-11 MED ORDER — SODIUM CHLORIDE 0.9% FLUSH: 3.0000 mL | INTRAVENOUS | Status: DC | PRN

## 2016-09-11 MED ORDER — SODIUM CHLORIDE 0.9 % IV SOLN: 250.0000 mL | INTRAVENOUS | Status: DC | PRN

## 2016-09-11 MED ORDER — LIDOCAINE HCL (PF) 1 % IJ SOLN
INTRAMUSCULAR | Status: AC
  Filled 2016-09-11: qty 30

## 2016-09-11 MED ORDER — VERAPAMIL HCL 2.5 MG/ML IV SOLN
INTRAVENOUS | Status: AC
  Filled 2016-09-11: qty 2

## 2016-09-11 MED ORDER — HEPARIN SODIUM (PORCINE) 1000 UNIT/ML IJ SOLN
INTRAMUSCULAR | Status: AC
Start: 2016-09-11 — End: 2016-09-11
  Filled 2016-09-11: qty 1

## 2016-09-11 MED ORDER — SODIUM CHLORIDE 0.9% FLUSH: 3.0000 mL | Freq: Two times a day (BID) | INTRAVENOUS | Status: DC

## 2016-09-11 MED ORDER — MIDAZOLAM HCL 2 MG/2ML IJ SOLN
INTRAMUSCULAR | Status: AC
  Filled 2016-09-11: qty 2

## 2016-09-11 MED ORDER — SODIUM CHLORIDE 0.9% FLUSH
3.0000 mL | Freq: Two times a day (BID) | INTRAVENOUS | Status: DC
  Administered 2016-09-11: 3 mL via INTRAVENOUS

## 2016-09-11 MED ORDER — SODIUM CHLORIDE 0.9 % IV SOLN
INTRAVENOUS | Status: DC
  Administered 2016-09-11: 11:00:00 via INTRAVENOUS

## 2016-09-11 MED ORDER — HEART ATTACK BOUNCING BOOK
Freq: Once | Status: AC
  Administered 2016-09-11: 20:00:00
  Filled 2016-09-11: qty 1

## 2016-09-11 MED ORDER — IOPAMIDOL (ISOVUE-370) INJECTION 76%
INTRAVENOUS | Status: AC
  Filled 2016-09-11: qty 100

## 2016-09-11 MED ORDER — IOPAMIDOL (ISOVUE-370) INJECTION 76%
INTRAVENOUS | Status: DC | PRN
Start: 2016-09-11 — End: 2016-09-11
  Administered 2016-09-11: 275 mL via INTRA_ARTERIAL

## 2016-09-11 MED ORDER — ASPIRIN 81 MG PO CHEW: 81.0000 mg | CHEWABLE_TABLET | ORAL | Status: DC

## 2016-09-11 MED ORDER — MIDAZOLAM HCL 2 MG/2ML IJ SOLN
INTRAMUSCULAR | Status: DC | PRN
  Administered 2016-09-11 (×3): 1 mg via INTRAVENOUS

## 2016-09-11 MED ORDER — IOPAMIDOL (ISOVUE-370) INJECTION 76%
INTRAVENOUS | Status: AC
Start: 2016-09-11 — End: 2016-09-11
  Filled 2016-09-11: qty 100

## 2016-09-11 MED ORDER — ASPIRIN 81 MG PO CHEW
81.0000 mg | CHEWABLE_TABLET | ORAL | Status: AC
  Administered 2016-09-11: 81 mg via ORAL
  Filled 2016-09-11: qty 1

## 2016-09-11 MED ORDER — LIDOCAINE HCL (PF) 1 % IJ SOLN
INTRAMUSCULAR | Status: DC | PRN
Start: 2016-09-11 — End: 2016-09-11
  Administered 2016-09-11: 2 mL via SUBCUTANEOUS

## 2016-09-11 MED ORDER — FENTANYL CITRATE (PF) 100 MCG/2ML IJ SOLN
INTRAMUSCULAR | Status: DC | PRN
Start: 2016-09-11 — End: 2016-09-11
  Administered 2016-09-11 (×3): 25 ug via INTRAVENOUS

## 2016-09-11 MED ORDER — SODIUM CHLORIDE 0.9 % WEIGHT BASED INFUSION: 3.0000 mL/kg/h | INTRAVENOUS | Status: AC

## 2016-09-11 SURGICAL SUPPLY — 21 items
BALLN EUPHORA RX 2.5X15 (BALLOONS) ×2
BALLOON EUPHORA RX 2.5X15 (BALLOONS) IMPLANT
CATH INFINITI 5 FR RCB (CATHETERS) ×1 IMPLANT
CATH INFINITI 5FR JL4 (CATHETERS) ×1 IMPLANT
CATH INFINITI 5FR MULTPACK ANG (CATHETERS) ×1 IMPLANT
CATH INFINITI 6F AL1 (CATHETERS) ×1 IMPLANT
DEVICE RAD COMP TR BAND LRG (VASCULAR PRODUCTS) ×1 IMPLANT
GLIDESHEATH SLEND A-KIT 6F 22G (SHEATH) ×1 IMPLANT
GUIDE CATH RUNWAY 6FR AL 1 (CATHETERS) ×1 IMPLANT
GUIDE CATH RUNWAY 6FR AR1 SH (CATHETERS) ×1 IMPLANT
GUIDE CATH RUNWAY 6FR LCB (CATHETERS) ×1 IMPLANT
KIT ENCORE 26 ADVANTAGE (KITS) ×1 IMPLANT
KIT HEART LEFT (KITS) ×2 IMPLANT
PACK CARDIAC CATHETERIZATION (CUSTOM PROCEDURE TRAY) ×2 IMPLANT
SHEATH PINNACLE 5F 10CM (SHEATH) IMPLANT
STENT PROMUS PREM MR 2.75X38 (Permanent Stent) ×1 IMPLANT
TRANSDUCER W/STOPCOCK (MISCELLANEOUS) ×2 IMPLANT
TUBING CIL FLEX 10 FLL-RA (TUBING) ×2 IMPLANT
WIRE HI TORQ BMW 190CM (WIRE) ×1 IMPLANT
WIRE RUNTHROUGH .014X180CM (WIRE) ×1 IMPLANT
WIRE SAFE-T 1.5MM-J .035X260CM (WIRE) ×1 IMPLANT

## 2016-09-11 NOTE — Progress Notes (Signed)
CRITICAL VALUE ALERT  Critical value received:  Troponin 1.13  Date of notification:  09/11/2016  Time of notification:  0030  Critical value read back: yes  Nurse who received alert:  Dorise Bullion   MD notified (1st page):  Dr. Harrington Challenger  Time of first page:  0035  MD notified (2nd page):  Time of second page:  Responding MD:    Time MD responded:

## 2016-09-11 NOTE — Research (Signed)
Patient is in the [place]. Patient is 9 months in the [place] Study and patient is either randomized to Brilinta only or Aspirin and Brilinta. Patient is in atrial fib so  study medications will need to be stopped. I informed patient  and asked patient not to take anymore study medications. Patient does not have any study medications with him at the hospital. I told patient we would have him return medications when he returns home.

## 2016-09-11 NOTE — Progress Notes (Signed)
TR BAND REMOVAL  LOCATION:    left radial  DEFLATED PER PROTOCOL:    Yes.    TIME BAND OFF / DRESSING APPLIED:    22:15   SITE UPON ARRIVAL:    Level 0  SITE AFTER BAND REMOVAL:    Level 0  CIRCULATION SENSATION AND MOVEMENT:    Within Normal Limits   Yes.    COMMENTS:   Post TR band instructions given. Pt tolerated well.

## 2016-09-11 NOTE — H&P (View-Only) (Signed)
Patient Name: Elijah Santana Date of Encounter: 09/11/2016  Active Problems:   Atrial fibrillation, rapid [facility])   Atrial fibrillation ([place])   Length of Stay: 1  SUBJECTIVE  Improved chest pain this am.   CURRENT MEDS . alfuzosin  10 mg Oral Daily  . aspirin EC  81 mg Oral Daily  . doxazosin  4 mg Oral Daily  . isosorbide mononitrate  60 mg Oral q morning - 10a  . metoprolol  50 mg Oral BID  . pantoprazole  40 mg Oral Daily  . ranolazine  500 mg Oral BID  . simvastatin  10 mg Oral Daily  . sodium chloride flush  3 mL Intravenous Q12H  . ticagrelor  90 mg Oral BID   OBJECTIVE  Vitals:   09/10/16 2253 09/10/16 2352 09/11/16 0358 09/11/16 0700  BP: 128/82 126/78 121/68   Pulse: (!) 130 81 79 (!) 53  Resp: (!) 23 (!) 23 20 12   Temp: 97.9 F (36.6 C) 97.8 F (36.6 C) 98 F (36.7 C) 97.9 F (36.6 C)  TempSrc: Oral Oral Oral Oral  SpO2: 95% 92% 95% 97%  Weight: 218 lb 14.4 oz (99.3 kg)  215 lb 8 oz (97.8 kg)   Height: 5\' 7"  (1.702 m)       Intake/Output Summary (Last 24 hours) at 09/11/16 0942 Last data filed at 09/11/16 0646  Gross per 24 hour  Intake           289.34 ml  Output              850 ml  Net          -560.66 ml   Filed Weights   09/10/16 1755 09/10/16 2253 09/11/16 0358  Weight: 215 lb (97.5 kg) 218 lb 14.4 oz (99.3 kg) 215 lb 8 oz (97.8 kg)   PHYSICAL EXAM  General: Pleasant, NAD. Neuro: Alert and oriented X 3. Moves all extremities spontaneously. Psych: Normal affect. HEENT:  Normal  Neck: Supple without bruits or JVD. Lungs:  Resp regular and unlabored, CTA. Heart: RRR no s3, s4, 2/6 systolic murmurs. Abdomen: Soft, non-tender, non-distended, BS + x 4.  Extremities: No clubbing, cyanosis or edema. DP/PT/Radials 2+ and equal bilaterally.  Accessory Clinical Findings  CBC  Recent Labs  09/10/16 1825 09/11/16 0434  WBC 5.9 7.7  HGB 13.5 12.7*  HCT 41.2 39.1  MCV 90.5 91.1  PLT 226 123XX123   Basic Metabolic Panel  Recent Labs  09/10/16 1825 09/11/16 0434  NA 137 141  K 3.8 3.6  CL 107 108  CO2 23 22  GLUCOSE 115* 128*  BUN 24* 17  CREATININE 1.08 1.00  CALCIUM 9.2 8.9   Liver Function Tests  Recent Labs  09/10/16 1825  AST 21  ALT 24  ALKPHOS 50  BILITOT 0.7  PROT 7.2  ALBUMIN 4.3    Recent Labs  09/10/16 1825 09/10/16 2323 09/11/16 0434  TROPONINI <0.03 1.13* 6.47*    Recent Labs  09/11/16 0434  CHOL 167  HDL 38*  LDLCALC 86  TRIG 214*  CHOLHDL 4.4   Thyroid Function Tests  Recent Labs  09/10/16 2323  TSH 4.364   Radiology/Studies  Dg Chest Port 1 View  Result Date: 09/10/2016 CLINICAL DATA:  82 y/o M; 2 hours of central chest pain and atrial fibrillation. EXAM: PORTABLE CHEST 1 VIEW COMPARISON:  12/30/2014 chest radiograph. FINDINGS: Cardiomediastinal silhouette is within normal limits and stable. Post median sternotomy with wires in alignment. Clear  lungs. No pneumothorax or pleural effusion. No acute osseous abnormality is evident. IMPRESSION: No acute pulmonary process identified. Electronically Signed   By: Kristine Garbe M.D.   On: 09/10/2016 19:05   TELE:   ECG: SR, non-specific ST T wave abnormalities  Cath 11/2015  Ramus lesion, 99% stenosed.  Ost LAD lesion, 99% stenosed.  Prox LAD lesion, 100% stenosed.  Prox Cx to Mid Cx lesion, 100% stenosed.  Prox RCA lesion, 100% stenosed.  Ost RCA lesion, 99% stenosed.  SVG was injected is normal in caliber.  There is severe disease in the graft.  SVG was injected is normal in caliber, and is anatomically normal.  SVG was injected .  There is severe disease in the graft.  Origin lesion, 100% stenosed.  LIMA was injected is normal in caliber, and is anatomically normal.  Dist LAD lesion, 100% stenosed.  There is mild left ventricular systolic dysfunction.  Dist Graft lesion, 85% stenosed. Post intervention, there is a 0% residual stenosis.   Mild LV dysfunction with a focal area of severe  hypo-to akinesis involving the mid anterolateral wall.  Global ejection fraction at 45 to less than 50%. LVEDP 22 mm Hg.    ASSESSMENT AND PLAN  Pt is an 82 yo with history of CAD with extensive h/o CABG and cath 9 see the last one from January 2017), over the past few wks has had increasing spells of CP, today had worst pain he has ever had, found to be in atrial fibrillation on admission, spontaneously cardioverted into the SR overnight.   1. NSTEMI - see the last cath above, we will schedule a LHC for today. He is on ASA/Brilinta in [place] study.  2. PAF - now in SR, we will d/c iv cardizem, continue heparin for NSTEMI, CHDS-VASc 5  3. Hyperlipidemia - continue simvastatin  4. Essential hypertension - d/c cardizem, restart amlodipine     The patient was admitted early this am however required further attention for an important medical decision, he will be scheduled for a left cardiac cath today for NSTEMI. Total time spent with the patient 35 minutes.   Signed, Ena Dawley MD, [facility] 09/11/2016

## 2016-09-11 NOTE — Progress Notes (Signed)
ANTICOAGULATION CONSULT NOTE - Follow Up Consult  Pharmacy Consult for Heparin  Indication: chest pain/ACS and atrial fibrillation  No Known Allergies  Patient Measurements: Height: 5\' 7"  (170.2 cm) Weight: 215 lb 8 oz (97.8 kg) IBW/kg (Calculated) : 66.1  Vital Signs: Temp: 97.8 F (36.6 C) (10/27 1151) Temp Source: Oral (10/27 1151) BP: 108/78 (10/27 1151) Pulse Rate: 61 (10/27 1151)  Labs:  Recent Labs  09/10/16 1825 09/10/16 2323 09/11/16 0434 09/11/16 1108 09/11/16 1109  HGB 13.5  --  12.7*  --   --   HCT 41.2  --  39.1  --   --   PLT 226  --  177  --   --   LABPROT  --   --  14.5  --   --   INR  --   --  1.13  --   --   HEPARINUNFRC  --   --  0.35  --  0.37  CREATININE 1.08  --  1.00  --   --   TROPONINI <0.03 1.13* 6.47* 4.20*  --     Estimated Creatinine Clearance: 63.5 mL/min (by C-G formula based on SCr of 1 mg/dL).    Assessment: Patient scheduled to go to the cath lab later today. Heparin level is therapeutic 0.37. RN reports no bleeding or infusion issues.  Goal of Therapy:  Heparin level 0.3-0.7 units/ml Monitor platelets by anticoagulation protocol: Yes   Plan:  -Cont heparin 1250 units/hr -Monitor CBC, heparin level - F/U anticoagulation plan post cath  Nicole Kindred L Londan Coplen 09/11/2016,1:33 PM

## 2016-09-11 NOTE — Progress Notes (Signed)
At approximately 2330 pt appeared to be in normal sinus rhythm. EKG confirmed Normal sinus. MD notified. No interventions advised at this time.

## 2016-09-11 NOTE — Progress Notes (Signed)
Pt's heart rate sustaining in the 50's. Reduced Cardizem to 5 ml/hr. Notified NP.

## 2016-09-11 NOTE — Progress Notes (Addendum)
Patient Name: Elijah Santana Date of Encounter: 09/11/2016  Active Problems:   Atrial fibrillation, rapid [facility])   Atrial fibrillation ([place])   Length of Stay: 1  SUBJECTIVE  Improved chest pain this am.   CURRENT MEDS . alfuzosin  10 mg Oral Daily  . aspirin EC  81 mg Oral Daily  . doxazosin  4 mg Oral Daily  . isosorbide mononitrate  60 mg Oral q morning - 10a  . metoprolol  50 mg Oral BID  . pantoprazole  40 mg Oral Daily  . ranolazine  500 mg Oral BID  . simvastatin  10 mg Oral Daily  . sodium chloride flush  3 mL Intravenous Q12H  . ticagrelor  90 mg Oral BID   OBJECTIVE  Vitals:   09/10/16 2253 09/10/16 2352 09/11/16 0358 09/11/16 0700  BP: 128/82 126/78 121/68   Pulse: (!) 130 81 79 (!) 53  Resp: (!) 23 (!) 23 20 12   Temp: 97.9 F (36.6 C) 97.8 F (36.6 C) 98 F (36.7 C) 97.9 F (36.6 C)  TempSrc: Oral Oral Oral Oral  SpO2: 95% 92% 95% 97%  Weight: 218 lb 14.4 oz (99.3 kg)  215 lb 8 oz (97.8 kg)   Height: 5\' 7"  (1.702 m)       Intake/Output Summary (Last 24 hours) at 09/11/16 0942 Last data filed at 09/11/16 0646  Gross per 24 hour  Intake           289.34 ml  Output              850 ml  Net          -560.66 ml   Filed Weights   09/10/16 1755 09/10/16 2253 09/11/16 0358  Weight: 215 lb (97.5 kg) 218 lb 14.4 oz (99.3 kg) 215 lb 8 oz (97.8 kg)   PHYSICAL EXAM  General: Pleasant, NAD. Neuro: Alert and oriented X 3. Moves all extremities spontaneously. Psych: Normal affect. HEENT:  Normal  Neck: Supple without bruits or JVD. Lungs:  Resp regular and unlabored, CTA. Heart: RRR no s3, s4, 2/6 systolic murmurs. Abdomen: Soft, non-tender, non-distended, BS + x 4.  Extremities: No clubbing, cyanosis or edema. DP/PT/Radials 2+ and equal bilaterally.  Accessory Clinical Findings  CBC  Recent Labs  09/10/16 1825 09/11/16 0434  WBC 5.9 7.7  HGB 13.5 12.7*  HCT 41.2 39.1  MCV 90.5 91.1  PLT 226 [id number]   Basic Metabolic Panel  Recent Labs  09/10/16 1825 09/11/16 0434  NA 137 141  K 3.8 3.6  CL 107 108  CO2 23 22  GLUCOSE 115* 128*  BUN 24* 17  CREATININE 1.08 1.00  CALCIUM 9.2 8.9   Liver Function Tests  Recent Labs  09/10/16 1825  AST 21  ALT 24  ALKPHOS 50  BILITOT 0.7  PROT 7.2  ALBUMIN 4.3    Recent Labs  09/10/16 1825 09/10/16 2323 09/11/16 0434  TROPONINI <0.03 1.13* 6.47*    Recent Labs  09/11/16 0434  CHOL 167  HDL 38*  LDLCALC 86  TRIG 214*  CHOLHDL 4.4   Thyroid Function Tests  Recent Labs  09/10/16 2323  TSH 4.364   Radiology/Studies  Dg Chest Port 1 View  Result Date: 09/10/2016 CLINICAL DATA:  82 y/o M; 2 hours of central chest pain and atrial fibrillation. EXAM: PORTABLE CHEST 1 VIEW COMPARISON:  12/30/2014 chest radiograph. FINDINGS: Cardiomediastinal silhouette is within normal limits and stable. Post median sternotomy with wires in alignment. Clear  lungs. No pneumothorax or pleural effusion. No acute osseous abnormality is evident. IMPRESSION: No acute pulmonary process identified. Electronically Signed   By: Kristine Garbe M.D.   On: 09/10/2016 19:05   TELE:   ECG: SR, non-specific ST T wave abnormalities  Cath 11/2015  Ramus lesion, 99% stenosed.  Ost LAD lesion, 99% stenosed.  Prox LAD lesion, 100% stenosed.  Prox Cx to Mid Cx lesion, 100% stenosed.  Prox RCA lesion, 100% stenosed.  Ost RCA lesion, 99% stenosed.  SVG was injected is normal in caliber.  There is severe disease in the graft.  SVG was injected is normal in caliber, and is anatomically normal.  SVG was injected .  There is severe disease in the graft.  Origin lesion, 100% stenosed.  LIMA was injected is normal in caliber, and is anatomically normal.  Dist LAD lesion, 100% stenosed.  There is mild left ventricular systolic dysfunction.  Dist Graft lesion, 85% stenosed. Post intervention, there is a 0% residual stenosis.   Mild LV dysfunction with a focal area of severe  hypo-to akinesis involving the mid anterolateral wall.  Global ejection fraction at 45 to less than 50%. LVEDP 22 mm Hg.    ASSESSMENT AND PLAN  Pt is an 82 yo with history of CAD with extensive h/o CABG and cath 9 see the last one from January 2017), over the past few wks has had increasing spells of CP, today had worst pain he has ever had, found to be in atrial fibrillation on admission, spontaneously cardioverted into the SR overnight.   1. NSTEMI - see the last cath above, we will schedule a LHC for today. He is on ASA/Brilinta in [place] study.  2. PAF - now in SR, we will d/c iv cardizem, continue heparin for NSTEMI, CHDS-VASc 5  3. Hyperlipidemia - continue simvastatin  4. Essential hypertension - d/c cardizem, restart amlodipine     The patient was admitted early this am however required further attention for an important medical decision, he will be scheduled for a left cardiac cath today for NSTEMI. Total time spent with the patient 35 minutes.   Signed, Ena Dawley MD, [facility] 09/11/2016

## 2016-09-11 NOTE — Progress Notes (Signed)
At approximately 2230 pt arrived to unit and appeared to be in sinus tach. Performed EKG that advised pt was sinus tach. MD notified. No interventions advised at this time.

## 2016-09-11 NOTE — Progress Notes (Signed)
ANTICOAGULATION CONSULT NOTE - Follow Up Consult  Pharmacy Consult for Heparin  Indication: chest pain/ACS and atrial fibrillation  No Known Allergies  Patient Measurements: Height: 5\' 7"  (170.2 cm) Weight: 215 lb 8 oz (97.8 kg) IBW/kg (Calculated) : 66.1  Vital Signs: Temp: 98 F (36.7 C) (10/27 0358) Temp Source: Oral (10/27 0358) BP: 121/68 (10/27 0358) Pulse Rate: 79 (10/27 0358)  Labs:  Recent Labs  09/10/16 1825 09/10/16 2323 09/11/16 0434  HGB 13.5  --  12.7*  HCT 41.2  --  39.1  PLT 226  --  177  HEPARINUNFRC  --   --  0.35  CREATININE 1.08  --  1.00  TROPONINI <0.03 1.13* 6.47*    Estimated Creatinine Clearance: 63.5 mL/min (by C-G formula based on SCr of 1 mg/dL).    Assessment: Tx from [facility], initial heparin level is therapeutic  Goal of Therapy:  Heparin level 0.3-0.7 units/ml Monitor platelets by anticoagulation protocol: Yes   Plan:  -Cont heparin 1250 units/hr -1200 HL  Marcelo Ickes 09/11/2016,5:44 AM

## 2016-09-11 NOTE — Interval H&P Note (Signed)
History and Physical Interval Note:  09/11/2016 3:42 PM  Elijah Santana  has presented today for surgery, with the diagnosis of nstemi with known CAD status post CABG  The various methods of treatment have been discussed with the patient and family. After consideration of risks, benefits and other options for treatment, the patient has consented to  Procedure(s): Left Heart Cath and Cors/Grafts Angiography (N/A) With Possible Percutaneous Coronary Intervention as a surgical intervention .  The patient's history has been reviewed, patient examined, no change in status, stable for surgery.  I have reviewed the patient's chart and labs.  Questions were answered to the patient's satisfaction.    Cath Lab Visit (complete for each Cath Lab visit)  Clinical Evaluation Leading to the Procedure:   ACS: Yes.    Non-ACS:    Anginal Classification: CCS IV  Anti-ischemic medical therapy: Maximal Therapy (2 or more classes of medications)  Non-Invasive Test Results: No non-invasive testing performed  Prior CABG: Previous CABG   Glenetta Hew

## 2016-09-12 ENCOUNTER — Encounter (HOSPITAL_COMMUNITY): Payer: Self-pay | Admitting: Cardiology

## 2016-09-12 DIAGNOSIS — Z9861 Coronary angioplasty status: Secondary | ICD-10-CM

## 2016-09-12 DIAGNOSIS — I4891 Unspecified atrial fibrillation: Secondary | ICD-10-CM

## 2016-09-12 DIAGNOSIS — I200 Unstable angina: Secondary | ICD-10-CM

## 2016-09-12 DIAGNOSIS — I214 Non-ST elevation (NSTEMI) myocardial infarction: Principal | ICD-10-CM

## 2016-09-12 LAB — BASIC METABOLIC PANEL
Anion gap: 7 (ref 5–15)
BUN: 17 mg/dL (ref 6–20)
CALCIUM: 8.7 mg/dL — AB (ref 8.9–10.3)
CO2: 22 mmol/L (ref 22–32)
CREATININE: 1.09 mg/dL (ref 0.61–1.24)
Chloride: 110 mmol/L (ref 101–111)
GFR calc non Af Amer: >60 mL/min (ref >60)
GLUCOSE: 101 mg/dL — AB (ref 65–99)
Potassium: 3.8 mmol/L (ref 3.5–5.1)
Sodium: 139 mmol/L (ref 135–145)

## 2016-09-12 LAB — CBC
HEMATOCRIT: 38.1 % — AB (ref 39.0–52.0)
Hemoglobin: 12.3 g/dL — ABNORMAL LOW (ref 13.0–17.0)
MCH: 28.9 pg (ref 26.0–34.0)
MCHC: 32.3 g/dL (ref 30.0–36.0)
MCV: 89.6 fL (ref 78.0–100.0)
Platelets: 177 10*3/uL (ref 150–400)
RBC: 4.25 MIL/uL (ref 4.22–5.81)
RDW: 14.2 % (ref 11.5–15.5)
WBC: 5.9 10*3/uL (ref 4.0–10.5)

## 2016-09-12 MED ORDER — ASPIRIN 81 MG PO TBEC: 81.0000 mg | DELAYED_RELEASE_TABLET | Freq: Every day | ORAL | 11 refills | Status: DC

## 2016-09-12 MED ORDER — ROSUVASTATIN CALCIUM 20 MG PO TABS
20.0000 mg | ORAL_TABLET | Freq: Every day | ORAL | Status: DC
  Administered 2016-09-12: 10:00:00 20 mg via ORAL
  Filled 2016-09-12: qty 1

## 2016-09-12 MED ORDER — ROSUVASTATIN CALCIUM 20 MG PO TABS: 20.0000 mg | ORAL_TABLET | Freq: Every day | ORAL | 3 refills | Status: DC

## 2016-09-12 MED ORDER — AMLODIPINE BESYLATE 5 MG PO TABS: 5.0000 mg | ORAL_TABLET | Freq: Every day | ORAL | 3 refills | Status: DC

## 2016-09-12 MED ORDER — CLOPIDOGREL BISULFATE 75 MG PO TABS
75.0000 mg | ORAL_TABLET | Freq: Every day | ORAL | Status: DC
Start: 2016-09-12 — End: 2016-09-12
  Administered 2016-09-12: 10:00:00 75 mg via ORAL
  Filled 2016-09-12: qty 1

## 2016-09-12 MED ORDER — APIXABAN 5 MG PO TABS: 5.0000 mg | ORAL_TABLET | Freq: Two times a day (BID) | ORAL | 3 refills | Status: DC

## 2016-09-12 MED ORDER — ACETAMINOPHEN 325 MG PO TABS: 650.0000 mg | ORAL_TABLET | ORAL | Status: DC | PRN

## 2016-09-12 MED ORDER — APIXABAN 5 MG PO TABS
5.0000 mg | ORAL_TABLET | Freq: Two times a day (BID) | ORAL | Status: DC
  Administered 2016-09-12: 5 mg via ORAL
  Filled 2016-09-12: qty 1

## 2016-09-12 MED ORDER — CLOPIDOGREL BISULFATE 75 MG PO TABS: 75.0000 mg | ORAL_TABLET | Freq: Every day | ORAL | 3 refills | Status: DC

## 2016-09-12 MED ORDER — RIVAROXABAN 20 MG PO TABS: 20.0000 mg | ORAL_TABLET | Freq: Every day | ORAL | Status: DC

## 2016-09-12 NOTE — Progress Notes (Signed)
CM received call from RN requesting Eliquis card to cover pt's discharge prescription.  This will give insurance enough time for authorization to cover refills.  No other CM needs were communicated.

## 2016-09-12 NOTE — Progress Notes (Signed)
Subjective:  No chest pain.  Objective:  Vital Signs in the last 24 hours: Temp:  [97 F (36.1 C)-98.6 F (37 C)] 98.6 F (37 C) (10/28 0700) Pulse Rate:  [0-89] 73 (10/28 0700) Resp:  [0-23] 12 (10/28 0700) BP: (91-142)/(52-101) 120/92 (10/28 0700) SpO2:  [0 %-100 %] 97 % (10/28 0700) Weight:  [220 lb 7.4 oz (100 kg)] 220 lb 7.4 oz (100 kg) (10/28 0301)  Intake/Output from previous day:  Intake/Output Summary (Last 24 hours) at 09/12/16 0748 Last data filed at 09/12/16 0536  Gross per 24 hour  Intake          2819.29 ml  Output             2000 ml  Net           819.29 ml    Physical Exam: General appearance: alert, cooperative and no distress Lungs: clear to auscultation bilaterally Heart: regular rate and rhythm Extremities: Lt radial site without hematoma Skin: pale cool dry Neurologic: Grossly normal   Rate: 78  Rhythm: normal sinus rhythm  Lab Results:  Recent Labs  09/11/16 0434 09/12/16 0351  WBC 7.7 5.9  HGB 12.7* 12.3*  PLT 177 177    Recent Labs  09/11/16 0434 09/12/16 0351  NA 141 139  K 3.6 3.8  CL 108 110  CO2 22 22  GLUCOSE 128* 101*  BUN 17 17  CREATININE 1.00 1.09    Recent Labs  09/11/16 0434 09/11/16 1108  TROPONINI 6.47* 4.20*    Recent Labs  09/11/16 0434  INR 1.13    Scheduled Meds: . alfuzosin  10 mg Oral Daily  . amLODipine  2.5 mg Oral Daily  . aspirin EC  81 mg Oral Daily  . doxazosin  4 mg Oral Daily  . isosorbide mononitrate  60 mg Oral q morning - 10a  . metoprolol  50 mg Oral BID  . off the beat book   Does not apply Once  . pantoprazole  40 mg Oral Daily  . ranolazine  500 mg Oral BID  . simvastatin  10 mg Oral Daily  . sodium chloride flush  3 mL Intravenous Q12H  . sodium chloride flush  3 mL Intravenous Q12H  . ticagrelor  90 mg Oral BID   Continuous Infusions:  PRN Meds:.sodium chloride, sodium chloride, acetaminophen, bisacodyl, nitroGLYCERIN, ondansetron (ZOFRAN) IV, sodium chloride  flush, sodium chloride flush   Imaging: Dg Chest Port 1 View  Result Date: 09/10/2016 CLINICAL DATA:  82 y/o M; 2 hours of central chest pain and atrial fibrillation. EXAM: PORTABLE CHEST 1 VIEW COMPARISON:  12/30/2014 chest radiograph. FINDINGS: Cardiomediastinal silhouette is within normal limits and stable. Post median sternotomy with wires in alignment. Clear lungs. No pneumothorax or pleural effusion. No acute osseous abnormality is evident. IMPRESSION: No acute pulmonary process identified. Electronically Signed   By: Kristine Garbe M.D.   On: 09/10/2016 19:05    Assessment/Plan:  82 y/o followed by Dr Domenic Polite with a history of CABG in '04, PCI in Feb 2016, and PCI Jan 2017. He presented 09/10/16 with NSTEMI and AF with RVR (new). Cath done 09/11/16 showed new SVG-OM3 disease, patent SVG-RCA stents, patent LIMA-LAD with distal LAD occlusion and occluded SVG-Dx. EF was 55%. He had been in the [place] study but he was removed because of PAF.   Principal Problem:   Accelerating angina [facility]) Active Problems:   NSTEMI- Troponin peak 6/47   CAD S/P percutaneous coronary angioplasty  Atrial fibrillation, rapid -new 09/10/16   S/P CABG x 4 2004   Essential hypertension   PAF- in setting of NSTEMI   Mixed hyperlipidemia   BPH (benign prostatic hyperplasia)   PLAN: Will discuss anticoagulation for PAF with MD-CHADs VASc=4.  Change Zocor to Crestor- (he is on low dose Zocor secondary to the addition of Ranexa in the past).   [employer] PA-C 09/12/2016, 7:48 AM [phone]

## 2016-09-12 NOTE — Progress Notes (Signed)
CARDIAC REHAB PHASE I   PRE:  Rate/Rhythm: 81 sinus rhythm   BP:  Supine:   Sitting: 113/55  Standing:    SaO2: 100% ra   MODE:  Ambulation: 700 ft, 3 standing rest breaks    POST:  Rate/Rhythem: 84 sinus rhythm  BP:  Supine:   Sitting: 118/64  Standing:    SaO2: 95% ra   [phone] Pt ambulated in hallway x1 assist, steady gait, without difficulty, denies chest pain or dyspnea.   Pt took 3 standing rest breaks for fatigue.    Education completed including risk factor modification, low fat-low cholesterol diet, exercise, and medication compliance.  Pt oriented to outpatient cardiac rehab.  At pt request, referral will be sent to [facility].  Understanding verbalized   [employer]

## 2016-09-12 NOTE — Discharge Summary (Signed)
Patient ID: Elijah Santana,  MRN: [id number], DOB/AGE: Oct 07, 1932 82 y.o.  Admit date: 09/10/2016 Discharge date: 09/12/2016  Primary Care Provider: Glenda Chroman, MD Primary Cardiologist: Dr Domenic Polite  Discharge Diagnoses Principal Problem:   Accelerating angina [facility]) Active Problems:   NSTEMI- Troponin peak 6/47   CAD S/P percutaneous coronary angioplasty   Atrial fibrillation, rapid -new 09/10/16   S/P CABG x 4 2004   Essential hypertension   PAF- in setting of NSTEMI   Mixed hyperlipidemia   BPH (benign prostatic hyperplasia)    Procedures: Cath/PCI 09/11/16   Hospital Course:  82 y/o followed by Dr Domenic Polite with a history of CABG in '04, PCI-SVG-RCA  in Feb 2016, and PCI SVG-RCA (new site) in Jan 2017. He presented 09/10/16 with NSTEMI and AF with RVR (new). Cath done 09/11/16 showed new SVG-OM3 disease, patent SVG-RCA stents, patent LIMA-LAD with distal LAD occlusion (L-L collaterals) and an occluded SVG-Dx (old). He received a DES to tandem lesions in the SVG to CFX. His EF was 55%. He had been in the [place] study but he was removed because of PAF. Eliquis added for PAF. Plan is to stop ASA after 30 days and continue Plavix and Eliquis. He is stable for discharge 09/12/16. Low dose Zocor stopped (on low dose dose secondary to Ranexa Rx) and Crestor added. Pt will need a TOC f/u in [place] in 7-10 days.    Discharge Vitals:  Blood pressure (!) 120/92, pulse 73, temperature 98.6 F (37 C), temperature source Oral, resp. rate 12, height 5\' 7"  (1.702 m), weight 220 lb 7.4 oz (100 kg), SpO2 97 %.    Labs: Results for orders placed or performed during the hospital encounter of 09/10/16 (from the past 24 hour(s))  Troponin I     Status: Abnormal   Collection Time: 09/11/16 11:08 AM  Result Value Ref Range   Troponin I 4.20 (HH) <0.03 ng/mL  Heparin level (unfractionated)     Status: None   Collection Time: 09/11/16 11:09 AM  Result Value Ref Range   Heparin  Unfractionated 0.37 0.30 - 0.70 IU/mL  POCT Activated clotting time     Status: None   Collection Time: 09/11/16  4:52 PM  Result Value Ref Range   Activated Clotting Time 241 seconds  POCT Activated clotting time     Status: None   Collection Time: 09/11/16  5:06 PM  Result Value Ref Range   Activated Clotting Time 329 seconds  POCT Activated clotting time     Status: None   Collection Time: 09/11/16  5:42 PM  Result Value Ref Range   Activated Clotting Time 274 seconds  CBC     Status: Abnormal   Collection Time: 09/12/16  3:51 AM  Result Value Ref Range   WBC 5.9 4.0 - 10.5 K/uL   RBC 4.25 4.22 - 5.81 MIL/uL   Hemoglobin 12.3 (L) 13.0 - 17.0 g/dL   HCT 38.1 (L) 39.0 - 52.0 %   MCV 89.6 78.0 - 100.0 fL   MCH 28.9 26.0 - 34.0 pg   MCHC 32.3 30.0 - 36.0 g/dL   RDW 14.2 11.5 - 15.5 %   Platelets 177 150 - 400 K/uL  Basic metabolic panel     Status: Abnormal   Collection Time: 09/12/16  3:51 AM  Result Value Ref Range   Sodium 139 135 - 145 mmol/L   Potassium 3.8 3.5 - 5.1 mmol/L   Chloride 110 101 - 111 mmol/L  CO2 22 22 - 32 mmol/L   Glucose, Bld 101 (H) 65 - 99 mg/dL   BUN 17 6 - 20 mg/dL   Creatinine, Ser 1.09 0.61 - 1.24 mg/dL   Calcium 8.7 (L) 8.9 - 10.3 mg/dL   GFR calc non Af Amer >60 >60 mL/min   GFR calc Af Amer >60 >60 mL/min   Anion gap 7 5 - 15    Disposition:  Follow-up Information    Rozann Lesches, MD .   Specialty:  Cardiology Why:  Office will conatct you Contact information: [place] [address] [phone]           Discharge Medications:    Medication List    STOP taking these medications   alfuzosin 10 MG 24 hr tablet Commonly known as:  UROXATRAL   AMBULATORY NON FORMULARY MEDICATION   simvastatin 10 MG tablet Commonly known as:  ZOCOR     TAKE these medications   acetaminophen 325 MG tablet Commonly known as:  TYLENOL Take 2 tablets (650 mg total) by mouth every 4 (four) hours as needed for headache or  mild pain.   amLODipine 5 MG tablet Commonly known as:  NORVASC Take 1 tablet (5 mg total) by mouth daily.   apixaban 5 MG Tabs tablet Commonly known as:  ELIQUIS Take 1 tablet (5 mg total) by mouth 2 (two) times daily.   aspirin 81 MG EC tablet Take 1 tablet (81 mg total) by mouth daily.   bisacodyl 5 MG EC tablet Commonly known as:  DULCOLAX Take 5-10 mg by mouth daily as needed (constipation). Reported on 05/28/2016   clopidogrel 75 MG tablet Commonly known as:  PLAVIX Take 1 tablet (75 mg total) by mouth daily.   doxazosin 4 MG tablet Commonly known as:  CARDURA Take 4 mg by mouth daily.   ibuprofen 200 MG tablet Commonly known as:  ADVIL,MOTRIN Take 200 mg by mouth every 6 (six) hours as needed for moderate pain.   isosorbide mononitrate 60 MG 24 hr tablet Commonly known as:  IMDUR TAKE 1 TABLET BY MOUTH  EVERY MORNING   metoprolol 50 MG tablet Commonly known as:  LOPRESSOR Take 1 tablet (50 mg total) by mouth 2 (two) times daily.   nitroGLYCERIN 0.4 MG SL tablet Commonly known as:  NITROSTAT PLACE ONE (1) TABLET UNDER TONGUE EVERY 5 MINUTES UP TO (3) DOSES AS NEEDED FOR CHEST PAIN.   pantoprazole 40 MG tablet Commonly known as:  PROTONIX Take 1 tablet (40 mg total) by mouth daily.   PRESCRIPTION MEDICATION Hormone shots done at Dr. Ralene Muskrat office once every 4 months (for prostrate) - last injection mid December 2016   ranolazine 500 MG 12 hr tablet Commonly known as:  RANEXA Take 1 tablet (500 mg total) by mouth 2 (two) times daily.   rosuvastatin 20 MG tablet Commonly known as:  CRESTOR Take 1 tablet (20 mg total) by mouth daily.        Duration of Discharge Encounter: Greater than 30 minutes including physician time.  Angelena Form PA-C 09/12/2016 9:01 AM

## 2016-09-12 NOTE — Discharge Instructions (Signed)
Coronary Angiogram With Stent, Care After °Refer to this sheet in the next few weeks. These instructions provide you with information about caring for yourself after your procedure. Your health care provider may also give you more specific instructions. Your treatment has been planned according to current medical practices, but problems sometimes occur. Call your health care provider if you have any problems or questions after your procedure. °WHAT TO EXPECT AFTER THE PROCEDURE  °After your procedure, it is typical to have the following: °· Bruising at the catheter insertion site that usually fades within 1-2 weeks. °· Blood collecting in the tissue (hematoma) that may be painful to the touch. It should usually decrease in size and tenderness within 1-2 weeks. °HOME CARE INSTRUCTIONS °· Take medicines only as directed by your health care provider. Blood thinners may be prescribed after your procedure to improve blood flow through the stent. °· You may shower 24-48 hours after the procedure or as directed by your health care provider. Remove the bandage (dressing) and gently wash the catheter insertion site with plain soap and water. Pat the area dry with a clean towel. Do not rub the site, because this may cause bleeding. °· Do not take baths, swim, or use a hot tub until your health care provider approves. °· Check your catheter insertion site every day for redness, swelling, or drainage. °· Do not apply powder or lotion to the site. °· Do not lift over 10 lb (4.5 kg) for 5 days after your procedure or as directed by your health care provider. °· Ask your health care provider when it is okay to: °¨ Return to work or school. °¨ Resume usual physical activities or sports. °¨ Resume sexual activity. °· Eat a heart-healthy diet. This should include plenty of fresh fruits and vegetables. Meat should be lean cuts. Avoid the following types of food: °¨ Food that is high in salt. °¨ Canned or highly processed food. °¨ Food  that is high in saturated fat or sugar. °¨ Fried food. °· Make any other lifestyle changes as recommended by your health care provider. These may include: °¨ Not using any tobacco products, including cigarettes, chewing tobacco, or electronic cigarettes. If you need help quitting, ask your health care provider. °¨ Managing your weight. °¨ Getting regular exercise. °¨ Managing your blood pressure. °¨ Limiting your alcohol intake. °¨ Managing other health problems, such as diabetes. °· If you need an MRI after your heart stent has been placed, be sure to tell the health care provider who orders the MRI that you have a heart stent. °· Keep all follow-up visits as directed by your health care provider. This is important. °SEEK MEDICAL CARE IF: °· You have a fever. °· You have chills. °· You have increased bleeding from the catheter insertion site. Hold pressure on the site. °SEEK IMMEDIATE MEDICAL CARE IF: °· You develop chest pain or shortness of breath, feel faint, or pass out. °· You have unusual pain at the catheter insertion site. °· You have redness, warmth, or swelling at the catheter insertion site. °· You have drainage (other than a small amount of blood on the dressing) from the catheter insertion site. °· The catheter insertion site is bleeding, and the bleeding does not stop after 30 minutes of holding steady pressure on the site. °· You develop bleeding from any other place, such as from your rectum. There may be bright red blood in your urine or stool, or it may appear as black, tarry stool. °  °  This information is not intended to replace advice given to you by your health care provider. Make sure you discuss any questions you have with your health care provider.   Document Released: 05/22/2005 Document Revised: 11/23/2014 Document Reviewed: 03/27/2013 Elsevier Interactive Patient Education 2016 [place] on my medicine - ELIQUIS (apixaban)  This medication education was reviewed  with me or my healthcare representative as part of my discharge preparation.   Why was Eliquis prescribed for you? Eliquis was prescribed for you to reduce the risk of a blood clot forming that can cause a stroke if you have a medical condition called atrial fibrillation (a type of irregular heartbeat).  What do You need to know about Eliquis ? Take your Eliquis TWICE DAILY - one tablet in the morning and one tablet in the evening with or without food. If you have difficulty swallowing the tablet whole please discuss with your pharmacist how to take the medication safely.  Take Eliquis exactly as prescribed by your doctor and DO NOT stop taking Eliquis without talking to the doctor who prescribed the medication.  Stopping may increase your risk of developing a stroke.  Refill your prescription before you run out.  After discharge, you should have regular check-up appointments with your healthcare provider that is prescribing your Eliquis.  In the future your dose may need to be changed if your kidney function or weight changes by a significant amount or as you get older.  What do you do if you miss a dose? If you miss a dose, take it as soon as you remember on the same day and resume taking twice daily.  Do not take more than one dose of ELIQUIS at the same time to make up a missed dose.  Important Safety Information A possible side effect of Eliquis is bleeding. You should call your healthcare provider right away if you experience any of the following: ? Bleeding from an injury or your nose that does not stop. ? Unusual colored urine (red or dark brown) or unusual colored stools (red or black). ? Unusual bruising for unknown reasons. ? A serious fall or if you hit your head (even if there is no bleeding).  Some medicines may interact with Eliquis and might increase your risk of bleeding or clotting while on Eliquis. To help avoid this, consult your healthcare provider or pharmacist  prior to using any new prescription or non-prescription medications, including herbals, vitamins, non-steroidal anti-inflammatory drugs (NSAIDs) and supplements.  This website has more information on Eliquis (apixaban): http://www.eliquis.com/eliquis/home

## 2016-09-14 ENCOUNTER — Encounter (HOSPITAL_COMMUNITY): Payer: Self-pay | Admitting: Cardiology

## 2016-09-14 ENCOUNTER — Other Ambulatory Visit: Payer: Self-pay | Admitting: *Deleted

## 2016-09-14 ENCOUNTER — Other Ambulatory Visit: Payer: Self-pay | Admitting: Cardiology

## 2016-09-14 MED ORDER — APIXABAN 5 MG PO TABS: 5.0000 mg | ORAL_TABLET | Freq: Two times a day (BID) | ORAL | 6 refills | Status: DC

## 2016-09-14 NOTE — Telephone Encounter (Signed)
Refill completed.

## 2016-09-14 NOTE — Telephone Encounter (Signed)
Pt is out of his medications that were prescribed while he was at [facility]   1. Which medications need to be refilled? (please list name of each medication and dose if known)  apixaban (ELIQUIS) 5 MG TABS tablet [id number]    2. Which pharmacy/location (including street and city if local pharmacy) is medication to be sent to?  [place]  3. Do they need a 30 day or 90 day supply?

## 2016-09-15 ENCOUNTER — Telehealth: Payer: Self-pay

## 2016-09-15 NOTE — Telephone Encounter (Signed)
-----   Message from Orinda Kenner sent at 09/15/2016  9:25 AM EDT ----- Regarding: TCM TOC f/u in 7-10 days in [place] with Dr Domenic Polite or an APP D/C'd 10/28.Marland Kitchen  Thanks, Coralyn Mark

## 2016-09-15 NOTE — Telephone Encounter (Signed)
Patient contacted regarding discharge from [facility] on 09/12/16.  Patient understands to follow up with provider Lenze PA-C on 09/23/16 at 140 pm at [place]. Patient understands discharge instructions? yes Patient understands medications and regiment? yes Patient understands to bring all medications to this visit? yes LM on private vm giving fu info

## 2016-09-16 ENCOUNTER — Other Ambulatory Visit: Payer: Self-pay | Admitting: *Deleted

## 2016-09-16 MED ORDER — APIXABAN 5 MG PO TABS: 5.0000 mg | ORAL_TABLET | Freq: Two times a day (BID) | ORAL | 3 refills | Status: DC

## 2016-09-18 ENCOUNTER — Ambulatory Visit (INDEPENDENT_AMBULATORY_CARE_PROVIDER_SITE_OTHER): Payer: Medicare Other | Admitting: Urology

## 2016-09-18 DIAGNOSIS — C61 Malignant neoplasm of prostate: Secondary | ICD-10-CM

## 2016-09-23 ENCOUNTER — Encounter: Payer: Self-pay | Admitting: Physician Assistant

## 2016-09-23 ENCOUNTER — Ambulatory Visit (INDEPENDENT_AMBULATORY_CARE_PROVIDER_SITE_OTHER): Payer: Medicare Other | Admitting: Physician Assistant

## 2016-09-23 VITALS — BP 130/74 | HR 64 | Ht 68.0 in | Wt 222.0 lb

## 2016-09-23 DIAGNOSIS — I10 Essential (primary) hypertension: Secondary | ICD-10-CM

## 2016-09-23 DIAGNOSIS — I2510 Atherosclerotic heart disease of native coronary artery without angina pectoris: Secondary | ICD-10-CM

## 2016-09-23 DIAGNOSIS — I480 Paroxysmal atrial fibrillation: Secondary | ICD-10-CM | POA: Diagnosis not present

## 2016-09-23 NOTE — Progress Notes (Signed)
Cardiology Office Note    Date:  09/23/2016   ID:  Elijah, Santana [birth year], MRN [id number]  PCP:  Glenda Chroman, MD  Cardiologist: Dr. Domenic Polite  Chief Complaint  Patient presents with  . Follow-up    History of Present Illness:  Elijah Santana is a 82 y.o. male with a history of CABG in '04, PCI-SVG-RCA  in Feb 2016, and PCI SVG-RCA (new site) in Jan 2017. He presented 09/10/16 with NSTEMI and AF with RVR (new). Cath done 09/11/16 showed new SVG-OM3 disease, patent SVG-RCA stents, patent LIMA-LAD with distal LAD occlusion (L-L collaterals) and an occluded SVG-Dx (old). He received a DES to tandem lesions in the SVG to CFX. His EF was 55%. He had been in the [place] study but he was removed because of PAF. Eliquis added for PAF. Plan is to stop ASA after 30 days and continue Plavix and Eliquis.  Patient comes in today for follow-up. He feels much better. He denies any further chest pain, palpitations, dyspnea, dyspnea on exertion, dizziness or presyncope. He's had no bleeding issues. He is worried about paying for Eliquis. He received the first month free.   Past Medical History:  Diagnosis Date  . Arthritis    back   . Asthma    Childhood  . Chronic back pain   . Coronary atherosclerosis of native coronary artery    a. CABG 2004 with LIMA-LAD, SVG-D1, SVG-OM, SVG-PDA. b. Cath ~2010 with occ of SVG-diagonal, distal LAD and OM filiing by collaterals. c. NSTEMI 12/2014 s/p DES to SVG-RCA. d. 11/2015: DES to distal graft of the SVG-distal RCA  . Essential hypertension   . Foley catheter in place   . GERD (gastroesophageal reflux disease)   . Hard of hearing   . Hyperglycemia   . Ischemic cardiomyopathy    a. EF 45% by cath 12/2014.  . Mixed hyperlipidemia   . Myocardial infarction   . NSTEMI (non-ST elevated myocardial infarction) ([place]) 01/01/15  . Pneumonia    hx of   . Prostate cancer [facility])    Radiation therapy 1996  . Skin cancer   . Stroke [facility])    TIA- 28 years ago       Past Surgical History:  Procedure Laterality Date  . CARDIAC CATHETERIZATION  01/01/2015   Procedure: CORONARY STENT INTERVENTION;  Surgeon: Peter M Martinique, MD;  Location: [facility] CATH LAB;  Service: Cardiovascular;;  SVG to PDA  . CARDIAC CATHETERIZATION N/A 11/21/2015   Procedure: Left Heart Cath and Cors/Grafts Angiography;  Surgeon: Troy Sine, MD;  Location: [facility] CV LAB;  Service: Cardiovascular;  Laterality: N/A;  . CARDIAC CATHETERIZATION N/A 11/21/2015   Procedure: Coronary Stent Intervention;  Surgeon: Troy Sine, MD;  Location: [place] CV LAB;  Service: Cardiovascular;  Laterality: N/A;  . CARDIAC CATHETERIZATION N/A 09/11/2016   Procedure: Left Heart Cath and Cors/Grafts Angiography;  Surgeon: Leonie Man, MD;  Location: [place] CV LAB;  Service: Cardiovascular;  Laterality: N/A;  . CARDIAC CATHETERIZATION N/A 09/11/2016   Procedure: Coronary Stent Intervention;  Surgeon: Leonie Man, MD;  Location: [place] [facility] CV LAB;  Service: Cardiovascular;  Laterality: N/A;  . CORONARY ARTERY BYPASS GRAFT  2004   LIMA to LAD, SVG to diagonal, SVG to circumflex, SVG to PDA  . GREEN LIGHT LASER TURP (TRANSURETHRAL RESECTION OF PROSTATE N/A 06/04/2016   Procedure: GREEN LIGHT LASER TURP (TRANSURETHRAL RESECTION OF PROSTATE;  Surgeon: Irine Seal, MD;  Location: [facility] ORS;  Service: Urology;  Laterality: N/A;  . LEFT HEART CATHETERIZATION WITH CORONARY/GRAFT ANGIOGRAM N/A 01/01/2015   Procedure: LEFT HEART CATHETERIZATION WITH Beatrix Fetters;  Surgeon: Peter M Martinique, MD;  Location: [facility] CATH LAB;  Service: Cardiovascular;  Laterality: N/A;  . PERCUTANEOUS CORONARY STENT INTERVENTION (PCI-S)  11/20/2015   distal SVG  with DES       . TONSILLECTOMY      Current Medications: Outpatient Medications Prior to Visit  Medication Sig Dispense Refill  . acetaminophen (TYLENOL) 325 MG tablet Take 2 tablets (650 mg total) by mouth every 4 (four) hours as needed for headache or  mild pain.    Marland Kitchen amLODipine (NORVASC) 5 MG tablet Take 1 tablet (5 mg total) by mouth daily. 90 tablet 3  . apixaban (ELIQUIS) 5 MG TABS tablet Take 1 tablet (5 mg total) by mouth 2 (two) times daily. 180 tablet 3  . aspirin EC 81 MG EC tablet Take 1 tablet (81 mg total) by mouth daily. 30 tablet 11  . bisacodyl (DULCOLAX) 5 MG EC tablet Take 5-10 mg by mouth daily as needed (constipation). Reported on 05/28/2016    . clopidogrel (PLAVIX) 75 MG tablet Take 1 tablet (75 mg total) by mouth daily. 90 tablet 3  . doxazosin (CARDURA) 4 MG tablet Take 4 mg by mouth daily.     Marland Kitchen ibuprofen (ADVIL,MOTRIN) 200 MG tablet Take 200 mg by mouth every 6 (six) hours as needed for moderate pain.    . isosorbide mononitrate (IMDUR) 60 MG 24 hr tablet TAKE 1 TABLET BY MOUTH  EVERY MORNING 90 tablet 3  . metoprolol (LOPRESSOR) 50 MG tablet Take 1 tablet (50 mg total) by mouth 2 (two) times daily. 180 tablet 3  . nitroGLYCERIN (NITROSTAT) 0.4 MG SL tablet PLACE ONE (1) TABLET UNDER TONGUE EVERY 5 MINUTES UP TO (3) DOSES AS NEEDED FOR CHEST PAIN. 25 tablet 3  . pantoprazole (PROTONIX) 40 MG tablet Take 1 tablet (40 mg total) by mouth daily. 90 tablet 3  . PRESCRIPTION MEDICATION Hormone shots done at Dr. Ralene Muskrat office once every 4 months (for prostrate) - last injection mid December 2016    . ranolazine (RANEXA) 500 MG 12 hr tablet Take 1 tablet (500 mg total) by mouth 2 (two) times daily. 28 tablet 0  . rosuvastatin (CRESTOR) 20 MG tablet Take 1 tablet (20 mg total) by mouth daily. 90 tablet 3   No facility-administered medications prior to visit.      Allergies:   Patient has no known allergies.   Social History   Social History  . Marital status: Married    Spouse name: N/A  . Number of children: N/A  . Years of education: N/A   Occupational History  . Retired     Office manager   Social History Main Topics  . Smoking status: Former Smoker    Packs/day: 0.50    Years: 27.00    Types: Cigars     Start date: 07/28/1964    Quit date: 07/28/1986  . Smokeless tobacco: Former Systems developer    Types: Chew    Quit date: 08/07/2010     Comment: never chewed up over a pack/day  . Alcohol use No  . Drug use: No  . Sexual activity: Not Asked   Other Topics Concern  . None   Social History Narrative  . None     Family History:  The patient's   family history includes Coronary artery disease in his father; Hypertension in  his father.   ROS:   Please see the history of present illness.    Review of Systems  Constitution: Positive for weakness and malaise/fatigue.  HENT: Negative.   Cardiovascular: Positive for leg swelling.  Respiratory: Negative.   Endocrine: Negative.   Hematologic/Lymphatic: Negative.   Musculoskeletal: Negative.   Gastrointestinal: Negative.   Genitourinary: Negative.    All other systems reviewed and are negative.   PHYSICAL EXAM:   VS:  BP 130/74   Pulse 64   Ht 5\' 8"  (1.727 m)   Wt 222 lb (100.7 kg)   SpO2 95%   BMI 33.75 kg/m   Physical Exam  GEN: Well nourished, well developed, in no acute distress  Neck: no JVD, carotid bruits, or masses Cardiac:RRR; 1/6 systolic murmur at the left sternal border Respiratory:  clear to auscultation bilaterally, normal work of breathing GI: soft, nontender, nondistended, + BS [id number] arm without hematoma or hemorrhage at cath site  Good radial and brachial pulses without cyanosis, clubbing,  Good distal pulses bilaterally.mild right ankle edema MS: no deformity or atrophy  Skin: warm and dry, no rash Psych: euthymic mood, full affect  Wt Readings from Last 3 Encounters:  09/23/16 222 lb (100.7 kg)  09/12/16 220 lb 7.4 oz (100 kg)  08/26/16 219 lb (99.3 kg)      Studies/Labs Reviewed:   EKG:  EKG is not ordered today.    Recent Labs: 09/10/2016: ALT 24; TSH 4.364 09/12/2016: BUN 17; Creatinine, Ser 1.09; Hemoglobin 12.3; Platelets 177; Potassium 3.8; Sodium 139   Lipid Panel    Component Value  Date/Time   CHOL 167 09/11/2016 0434   TRIG 214 (H) 09/11/2016 0434   HDL 38 (L) 09/11/2016 0434   CHOLHDL 4.4 09/11/2016 0434   VLDL 43 (H) 09/11/2016 0434   LDLCALC 86 09/11/2016 0434    Additional studies/ records that were reviewed today include:  Cardiac cath 09/11/16 Procedures  Coronary Stent Intervention  Left Heart Cath and Cors/Grafts Angiography  Conclusion     SVG-OM3: Culprit Tandem lesions - Mid Graft to Dist Graft lesion, 70 %stenosed. Dist Graft to Insertion lesion, 95 %stenosed.  A STENT PROMUS PREM MR 2.75X38 drug eluting stent was successfully placed covering both lesions. Post intervention, there is a 0% residual stenosis.  _____________________________________  Additional Angiography:  Ost LAD lesion, 99 %stenosed. Prox LAD to Mid LAD lesion, 100 %stenosed after small D1.  LIMA-LAD and is normal in caliber. The LAD is grafted in between 2 diagonal branches. Downstream below the D3, Dist LAD lesion, 100 %stenosed - fills via collaterals from the circumflex system.  Prox Cx to Mid Cx lesion, 100 %stenosed. Ramus lesion, 99 %stenosed. - No change from previous cath  Prox RCA lesion, 100 %stenosed. Mid RCA to Dist RCA lesion, 100 %stenosed. The vessels actually more occluded than previously noted  SVG-dRCA: Prox Graft to Mid Graft stent, ~15 %stenosed, Dist Graft stent, 0 %stenosed.  SVG-Diag Origin lesion, 100 %stenosed.  The left ventricular systolic function is low normal. The left ventricular ejection fraction is 50-55% by visual estimate - there is a hint of possible mild anterolateral hypokinesis  LV end diastolic pressure is mildly elevated.     Successful PCI of tandem lesions in the SVG-OM 3. Otherwise angiographically similar coronary arteries and grafts compared to catheterization in January 2017.   Plan: Transfer to 6 central post procedure unit for post cath care. Would continue aspirin plus Brilinta. If on Twilight Study, would need to then  restart  aspirin for at least 3 months. Continue other cardiac risk factor modification   Expected if he is stable he can be discharged as early as tomorrow.           ASSESSMENT:    1. Coronary artery disease involving native coronary artery of native heart without angina pectoris   2. Essential hypertension   3. Ischemic cardiomyopathy      PLAN:  In order of problems listed above:  CAD status post recent NSTEMI treated with DES to SVG to OM3.LVEF 50-55%. Please see cath details above. No recent chest pain. Stop aspirin 10/13/16. Continue Eliquis and Plavix. Follow-up with Dr. Domenic Polite in one month.  Essential hypertension controlled  PAF in the setting of MI.no evidence of recurrence. Continue metoprolol and Eliquis.  Medication Adjustments/Labs and Tests Ordered: Current medicines are reviewed at length with the patient today.  Concerns regarding medicines are outlined above.  Medication changes, Labs and Tests ordered today are listed in the Patient Instructions below. Patient Instructions  Your physician recommends that you schedule a follow-up appointment in: 1 Month with Dr. Domenic Polite  Your physician has recommended you make the following change in your medication:   STOP Aspirin November 28  If you need a refill on your cardiac medications before your next appointment, please call your pharmacy.  Thank you for choosing [facility]!        Signed, Ermalinda Barrios, PA-C  09/23/2016 2:14 PM    [facility] [address], [place], [place]  [address] Phone: [phone]; Fax: [phone]

## 2016-09-23 NOTE — Patient Instructions (Signed)
Your physician recommends that you schedule a follow-up appointment in: 1 Month with Dr. Domenic Polite  Your physician has recommended you make the following change in your medication:   STOP Aspirin November 28  If you need a refill on your cardiac medications before your next appointment, please call your pharmacy.  Thank you for choosing [facility]!

## 2016-09-25 ENCOUNTER — Other Ambulatory Visit: Payer: Self-pay | Admitting: Urology

## 2016-09-25 DIAGNOSIS — C61 Malignant neoplasm of prostate: Secondary | ICD-10-CM

## 2016-10-02 ENCOUNTER — Encounter (HOSPITAL_COMMUNITY)
Admission: RE | Admit: 2016-10-02 | Discharge: 2016-10-02 | Disposition: A | Discharge status: Home / Self Care | Payer: Medicare Other | Source: Ambulatory Visit | Attending: Urology | Admitting: Urology

## 2016-10-02 ENCOUNTER — Ambulatory Visit (HOSPITAL_COMMUNITY)
Admission: RE | Admit: 2016-10-02 | Discharge: 2016-10-02 | Disposition: A | Discharge status: Home / Self Care | Payer: Medicare Other | Source: Ambulatory Visit | Attending: Urology | Admitting: Urology

## 2016-10-02 ENCOUNTER — Encounter (HOSPITAL_COMMUNITY): Payer: Self-pay

## 2016-10-02 DIAGNOSIS — C61 Malignant neoplasm of prostate: Secondary | ICD-10-CM | POA: Insufficient documentation

## 2016-10-02 MED ORDER — TECHNETIUM TC 99M MEDRONATE IV KIT
25.0000 | PACK | Freq: Once | INTRAVENOUS | Status: AC | PRN
  Administered 2016-10-02: 19.5 via INTRAVENOUS

## 2016-10-02 MED ORDER — IOPAMIDOL (ISOVUE-300) INJECTION 61%
100.0000 mL | Freq: Once | INTRAVENOUS | Status: AC | PRN
  Administered 2016-10-02: 100 mL via INTRAVENOUS

## 2016-10-05 ENCOUNTER — Other Ambulatory Visit: Payer: Self-pay | Admitting: *Deleted

## 2016-10-05 MED ORDER — ROSUVASTATIN CALCIUM 20 MG PO TABS: 20.0000 mg | ORAL_TABLET | Freq: Every day | ORAL | 3 refills | Status: DC

## 2016-10-14 ENCOUNTER — Telehealth: Payer: Self-pay | Admitting: Cardiology

## 2016-10-14 NOTE — Telephone Encounter (Signed)
Pt is in the doughnut hole w/ his apixaban (ELIQUIS) 5 MG TABS tablet [id number]

## 2016-10-14 NOTE — Telephone Encounter (Signed)
Patient given 2 sample boxes of Eliquis and 2 sample boxes of Ranexa.

## 2016-10-27 ENCOUNTER — Other Ambulatory Visit: Payer: Self-pay | Admitting: Cardiology

## 2016-10-27 MED ORDER — APIXABAN 5 MG PO TABS: 5.0000 mg | ORAL_TABLET | Freq: Two times a day (BID) | ORAL | 0 refills | Status: DC

## 2016-10-27 NOTE — Telephone Encounter (Signed)
Pt walked into office - provided samples

## 2016-10-27 NOTE — Telephone Encounter (Signed)
Patient is in the dough nut whole and needs Eliquis if we have samples.

## 2016-11-11 ENCOUNTER — Encounter: Payer: Self-pay | Admitting: Cardiology

## 2016-11-11 ENCOUNTER — Ambulatory Visit (INDEPENDENT_AMBULATORY_CARE_PROVIDER_SITE_OTHER): Payer: Medicare Other | Admitting: Cardiology

## 2016-11-11 VITALS — BP 101/63 | HR 62 | Ht 68.0 in | Wt 221.0 lb

## 2016-11-11 DIAGNOSIS — I480 Paroxysmal atrial fibrillation: Secondary | ICD-10-CM | POA: Diagnosis not present

## 2016-11-11 DIAGNOSIS — I10 Essential (primary) hypertension: Secondary | ICD-10-CM | POA: Diagnosis not present

## 2016-11-11 DIAGNOSIS — I2510 Atherosclerotic heart disease of native coronary artery without angina pectoris: Secondary | ICD-10-CM | POA: Diagnosis not present

## 2016-11-11 DIAGNOSIS — E782 Mixed hyperlipidemia: Secondary | ICD-10-CM | POA: Diagnosis not present

## 2016-11-11 NOTE — Patient Instructions (Signed)
Medication Instructions:  Continue all current medications.  Labwork: none  Testing/Procedures: none  Follow-Up: 3 months   Any Other Special Instructions Will Be Listed Below (If Applicable).  If you need a refill on your cardiac medications before your next appointment, please call your pharmacy.  

## 2016-11-11 NOTE — Progress Notes (Signed)
Cardiology Office Note  Date: 11/11/2016   ID: Elijah Santana 1932/08/08, MRN [id number]  PCP: Glenda Chroman, MD  Primary Cardiologist: Rozann Lesches, MD   Chief Complaint  Patient presents with  . Coronary Artery Disease  . Atrial Fibrillation    History of Present Illness: Elijah Santana is an 82 y.o. male last seen in November by Ms. Bonnell Public PA-C. He presented in October with an NSTEMI in the setting of atrial fibrillation with RVR. Cardiac catheterization revealed new disease within the SVG to OM 3 which was managed with DES intervention. Otherwise patent stent site noted in the SVG to RCA, patent LIMA to LAD with distal LAD occlusion and left to left collaterals, and known occlusion of the SVG to diagonal. He was placed on Eliquis with plan to stop aspirin in 30 days but otherwise continue Plavix.  He presents for a follow-up visit. Reports no angina symptoms. Also no palpitations. We went over his medications which are outlined below. He has stopped aspirin. No bleeding problems on Plavix and Eliquis.  He has not started cardiac rehabilitation as yet.  Past Medical History:  Diagnosis Date  . Arthritis    back   . Asthma    Childhood  . Chronic back pain   . Coronary atherosclerosis of native coronary artery    a. CABG 2004 with LIMA-LAD, SVG-D1, SVG-OM, SVG-PDA. b. Cath ~2010 with occ of SVG-diagonal, distal LAD and OM filiing by collaterals. c. NSTEMI 12/2014 s/p DES to SVG-RCA. d. 11/2015: DES to distal graft of the SVG-distal RCA  . Essential hypertension   . Foley catheter in place   . GERD (gastroesophageal reflux disease)   . Hard of hearing   . Hyperglycemia   . Ischemic cardiomyopathy    a. EF 45% by cath 12/2014.  . Mixed hyperlipidemia   . Myocardial infarction   . NSTEMI (non-ST elevated myocardial infarction) ([place]) 01/01/15  . Pneumonia    hx of   . Prostate cancer [facility])    Radiation therapy 1996  . Skin cancer   . Stroke [facility])    TIA- 28 years ago      Past Surgical History:  Procedure Laterality Date  . CARDIAC CATHETERIZATION  01/01/2015   Procedure: CORONARY STENT INTERVENTION;  Surgeon: Peter M Martinique, MD;  Location: [facility] CATH LAB;  Service: Cardiovascular;;  SVG to PDA  . CARDIAC CATHETERIZATION N/A 11/21/2015   Procedure: Left Heart Cath and Cors/Grafts Angiography;  Surgeon: Troy Sine, MD;  Location: [place] CV LAB;  Service: Cardiovascular;  Laterality: N/A;  . CARDIAC CATHETERIZATION N/A 11/21/2015   Procedure: Coronary Stent Intervention;  Surgeon: Troy Sine, MD;  Location: [place] CV LAB;  Service: Cardiovascular;  Laterality: N/A;  . CARDIAC CATHETERIZATION N/A 09/11/2016   Procedure: Left Heart Cath and Cors/Grafts Angiography;  Surgeon: Leonie Man, MD;  Location: [place] CV LAB;  Service: Cardiovascular;  Laterality: N/A;  . CARDIAC CATHETERIZATION N/A 09/11/2016   Procedure: Coronary Stent Intervention;  Surgeon: Leonie Man, MD;  Location: [place] CV LAB;  Service: Cardiovascular;  Laterality: N/A;  . CORONARY ARTERY BYPASS GRAFT  2004   LIMA to LAD, SVG to diagonal, SVG to circumflex, SVG to PDA  . GREEN LIGHT LASER TURP (TRANSURETHRAL RESECTION OF PROSTATE N/A 06/04/2016   Procedure: GREEN LIGHT LASER TURP (TRANSURETHRAL RESECTION OF PROSTATE;  Surgeon: Irine Seal, MD;  [facility]: [facility] ORS;  Service: Urology;  Laterality: N/A;  . LEFT HEART  CATHETERIZATION WITH CORONARY/GRAFT ANGIOGRAM N/A 01/01/2015   Procedure: LEFT HEART CATHETERIZATION WITH Beatrix Fetters;  Surgeon: Peter M Martinique, MD;  Location: [facility] CATH LAB;  Service: Cardiovascular;  Laterality: N/A;  . PERCUTANEOUS CORONARY STENT INTERVENTION (PCI-S)  11/20/2015   distal SVG  with DES       . TONSILLECTOMY      Current Outpatient Prescriptions  Medication Sig Dispense Refill  . acetaminophen (TYLENOL) 325 MG tablet Take 2 tablets (650 mg total) by mouth every 4 (four) hours as needed for headache or mild pain.    Marland Kitchen amLODipine  (NORVASC) 5 MG tablet Take 1 tablet (5 mg total) by mouth daily. 90 tablet 3  . apixaban (ELIQUIS) 5 MG TABS tablet Take 1 tablet (5 mg total) by mouth 2 (two) times daily. 42 tablet 0  . bisacodyl (DULCOLAX) 5 MG EC tablet Take 5-10 mg by mouth daily as needed (constipation). Reported on 05/28/2016    . clopidogrel (PLAVIX) 75 MG tablet Take 1 tablet (75 mg total) by mouth daily. 90 tablet 3  . doxazosin (CARDURA) 4 MG tablet Take 4 mg by mouth daily.     . isosorbide mononitrate (IMDUR) 60 MG 24 hr tablet TAKE 1 TABLET BY MOUTH  EVERY MORNING 90 tablet 3  . metoprolol (LOPRESSOR) 50 MG tablet Take 1 tablet (50 mg total) by mouth 2 (two) times daily. 180 tablet 3  . nitroGLYCERIN (NITROSTAT) 0.4 MG SL tablet PLACE ONE (1) TABLET UNDER TONGUE EVERY 5 MINUTES UP TO (3) DOSES AS NEEDED FOR CHEST PAIN. 25 tablet 3  . pantoprazole (PROTONIX) 40 MG tablet Take 1 tablet (40 mg total) by mouth daily. 90 tablet 3  . PRESCRIPTION MEDICATION Hormone shots done at Dr. Ralene Muskrat office once every 4 months (for prostrate) - last injection mid December 2016    . ranolazine (RANEXA) 500 MG 12 hr tablet Take 1 tablet (500 mg total) by mouth 2 (two) times daily. 28 tablet 0  . rosuvastatin (CRESTOR) 20 MG tablet Take 1 tablet (20 mg total) by mouth daily. 90 tablet 3   No current facility-administered medications for this visit.    Allergies:  Patient has no known allergies.   Social History: The patient  reports that he quit smoking about 30 years ago. His smoking use included Cigars. He started smoking about 52 years ago. He has a 13.50 pack-year smoking history. He quit smokeless tobacco use about 6 years ago. His smokeless tobacco use included Chew. He reports that he does not drink alcohol or use drugs.   ROS:  Please see the history of present illness. Otherwise, complete review of systems is positive for hearing loss.  All other systems are reviewed and negative.   Physical Exam: VS:  BP 101/63   Pulse 62    Ht 5\' 8"  (1.727 m)   Wt 221 lb (100.2 kg)   BMI 33.60 kg/m , BMI Body mass index is 33.6 kg/m.  Wt Readings from Last 3 Encounters:  11/11/16 221 lb (100.2 kg)  09/23/16 222 lb (100.7 kg)  09/12/16 220 lb 7.4 oz (100 kg)    General: Patient appears comfortable at rest. HEENT: Conjunctiva and lids normal, oropharynx clear. Neck: Supple, no elevated JVP or carotid bruits, no thyromegaly. Lungs: Clear to auscultation, nonlabored breathing at rest. Cardiac: Regular rate and rhythm, no S3 or significant systolic murmur, no pericardial rub. Abdomen: Soft, nontender, bowel sounds present. Extremities: No pitting edema, distal pulses 2+. Skin: Warm and dry. Musculoskeletal: No kyphosis.  Neuropsychiatric: Alert and oriented 3, affect appropriate.  ECG: I personally reviewed the tracing from 09/12/2016 which showed sinus rhythm with prolonged PR interval, leftward axis, nonspecific ST-T changes.  Recent Labwork: 09/10/2016: ALT 24; AST 21; TSH 4.364 09/12/2016: BUN 17; Creatinine, Ser 1.09; Hemoglobin 12.3; Platelets 177; Potassium 3.8; Sodium 139     Component Value Date/Time   CHOL 167 09/11/2016 0434   TRIG 214 (H) 09/11/2016 0434   HDL 38 (L) 09/11/2016 0434   CHOLHDL 4.4 09/11/2016 0434   VLDL 43 (H) 09/11/2016 0434   LDLCALC 86 09/11/2016 0434    Other Studies Reviewed Today:  Cardiac catheterization 09/11/2016:  SVG-OM3: Culprit Tandem lesions - Mid Graft to Dist Graft lesion, 70 %stenosed. Dist Graft to Insertion lesion, 95 %stenosed.  A STENT PROMUS PREM MR 2.75X38 drug eluting stent was successfully placed covering both lesions. Post intervention, there is a 0% residual stenosis.   Additional Angiography:  Ost LAD lesion, 99 %stenosed. Prox LAD to Mid LAD lesion, 100 %stenosed after small D1.  LIMA-LAD and is normal in caliber. The LAD is grafted in between 2 diagonal branches. Downstream below the D3, Dist LAD lesion, 100 %stenosed - fills via collaterals from  the circumflex system.  Prox Cx to Mid Cx lesion, 100 %stenosed. Ramus lesion, 99 %stenosed. - No change from previous cath  Prox RCA lesion, 100 %stenosed. Mid RCA to Dist RCA lesion, 100 %stenosed. The vessels actually more occluded than previously noted  SVG-dRCA: Prox Graft to Mid Graft stent, ~15 %stenosed, Dist Graft stent, 0 %stenosed.  SVG-Diag Origin lesion, 100 %stenosed.  The left ventricular systolic function is low normal. The left ventricular ejection fraction is 50-55% by visual estimate - there is a hint of possible mild anterolateral hypokinesis  LV end diastolic pressure is mildly elevated.   Successful PCI of tandem lesions in the SVG-OM 3. Otherwise angiographically similar coronary arteries and grafts compared to catheterization in January 2017.  Assessment and Plan:  1. Multivessel CAD status post CABG with graft disease and subsequent percutaneous interventions, most recently DES to the SVG to OM 3. He is symptomatically stable at this time on current medical regimen. No changes were made today.  2. Paroxysmal atrial fibrillation, complicating ACS in October. He is tolerating eloquent's. Heart rate regular today. Continue Lopressor.  3. Essential hypertension, blood pressure is well controlled today.  4. Hyperlipidemia, now on Crestor. LDL 86 in October. We will plan a follow-up lipid panel around the time of his next visit.  Current medicines were reviewed with the patient today.   Disposition: Follow-up in 3 months.  Signed, Satira Sark, MD, [facility] 11/11/2016 2:23 PM    [place] at [address], [place], [place] [address] Phone: [phone]; Fax: [phone]

## 2016-11-13 ENCOUNTER — Ambulatory Visit (INDEPENDENT_AMBULATORY_CARE_PROVIDER_SITE_OTHER): Payer: Medicare Other | Admitting: Urology

## 2016-11-13 DIAGNOSIS — N401 Enlarged prostate with lower urinary tract symptoms: Secondary | ICD-10-CM

## 2016-11-13 DIAGNOSIS — C61 Malignant neoplasm of prostate: Secondary | ICD-10-CM | POA: Diagnosis not present

## 2016-11-13 DIAGNOSIS — R351 Nocturia: Secondary | ICD-10-CM | POA: Diagnosis not present

## 2016-11-30 ENCOUNTER — Telehealth: Payer: Self-pay | Admitting: Cardiology

## 2016-11-30 NOTE — Telephone Encounter (Signed)
clopidogrel (PLAVIX) 75 MG tablet   Would like to know if he needs to continue this medication. Also, would like to review all his medications to make sure he is talking what he is suppose to be taking.

## 2016-11-30 NOTE — Telephone Encounter (Signed)
Pt aware to remain on plavix - wanted medication list since will be goin to pill pack to better manage medication - pt will come by office to pick up printed medication list

## 2016-12-01 ENCOUNTER — Other Ambulatory Visit: Payer: Self-pay | Admitting: *Deleted

## 2016-12-01 MED ORDER — METOPROLOL TARTRATE 50 MG PO TABS: 50.0000 mg | ORAL_TABLET | Freq: Two times a day (BID) | ORAL | 3 refills | Status: DC

## 2016-12-01 MED ORDER — APIXABAN 5 MG PO TABS: 5.0000 mg | ORAL_TABLET | Freq: Two times a day (BID) | ORAL | 3 refills | Status: DC

## 2016-12-01 MED ORDER — CLOPIDOGREL BISULFATE 75 MG PO TABS: 75.0000 mg | ORAL_TABLET | Freq: Every day | ORAL | 3 refills | Status: DC

## 2016-12-01 MED ORDER — RANOLAZINE ER 500 MG PO TB12: 500.0000 mg | ORAL_TABLET | Freq: Two times a day (BID) | ORAL | 3 refills | Status: DC

## 2016-12-01 MED ORDER — DOXAZOSIN MESYLATE 4 MG PO TABS: 4.0000 mg | ORAL_TABLET | Freq: Every day | ORAL | 3 refills | Status: DC

## 2016-12-01 MED ORDER — ISOSORBIDE MONONITRATE ER 60 MG PO TB24: 60.0000 mg | ORAL_TABLET | Freq: Every morning | ORAL | 3 refills | Status: DC

## 2016-12-01 MED ORDER — ROSUVASTATIN CALCIUM 20 MG PO TABS: 20.0000 mg | ORAL_TABLET | Freq: Every day | ORAL | 3 refills | Status: DC

## 2016-12-01 MED ORDER — AMLODIPINE BESYLATE 5 MG PO TABS
5.0000 mg | ORAL_TABLET | Freq: Every day | ORAL | 3 refills | Status: DC
Start: 2016-12-01 — End: 2017-03-16

## 2016-12-01 MED ORDER — PANTOPRAZOLE SODIUM 40 MG PO TBEC: 40.0000 mg | DELAYED_RELEASE_TABLET | Freq: Every day | ORAL | 3 refills | Status: DC

## 2017-01-06 ENCOUNTER — Other Ambulatory Visit: Payer: Self-pay | Admitting: Cardiovascular Disease

## 2017-01-06 ENCOUNTER — Other Ambulatory Visit: Payer: Self-pay | Admitting: Cardiology

## 2017-01-22 ENCOUNTER — Other Ambulatory Visit: Payer: Self-pay | Admitting: Cardiovascular Disease

## 2017-01-26 ENCOUNTER — Telehealth (HOSPITAL_COMMUNITY): Payer: Self-pay | Admitting: Nurse Practitioner

## 2017-01-26 NOTE — Telephone Encounter (Signed)
Patient contacted in regards to the TWILIGHT research study. Patient is taking Plavix and Eliquis as directed without any bleeding issues at this time.patient did express concern about affording his Eliquis. I instructed him to contact his pharmacist and cardiologist in regards to possible discount programs for affordability. I will relay this information to Tammy about his issue with affordability.

## 2017-01-28 ENCOUNTER — Telehealth: Payer: Self-pay | Admitting: Cardiology

## 2017-01-28 NOTE — Telephone Encounter (Signed)
Opened in error

## 2017-02-05 ENCOUNTER — Ambulatory Visit (INDEPENDENT_AMBULATORY_CARE_PROVIDER_SITE_OTHER): Payer: Medicare Other | Admitting: Urology

## 2017-02-05 DIAGNOSIS — R351 Nocturia: Secondary | ICD-10-CM

## 2017-02-05 DIAGNOSIS — N401 Enlarged prostate with lower urinary tract symptoms: Secondary | ICD-10-CM | POA: Diagnosis not present

## 2017-02-05 DIAGNOSIS — R9721 Rising PSA following treatment for malignant neoplasm of prostate: Secondary | ICD-10-CM

## 2017-02-05 DIAGNOSIS — C61 Malignant neoplasm of prostate: Secondary | ICD-10-CM | POA: Diagnosis not present

## 2017-02-11 NOTE — Progress Notes (Signed)
Cardiology Office Note  Date: 02/12/2017   ID: Eragon, Hammond 11/18/1931, MRN [id number]  PCP: Glenda Chroman, MD  Primary Cardiologist: Rozann Lesches, MD   Chief Complaint  Patient presents with  . Coronary Artery Disease    History of Present Illness: Elijah Santana is an 83 y.o. male last seen in December 2017. He presents for a routine follow-up visit. Reports no progressive angina since our last encounter. He reports compliance with his medications.  Cardiac catheterization in October 2017 revealed new disease within the SVG to OM 3 which was managed with DES intervention. Otherwise patent stent site noted in the SVG to RCA, patent LIMA to LAD with distal LAD occlusion and left to left collaterals, and known occlusion of the SVG to diagonal.  He remains on both Plavix (DES) and Eliquis (atrial fibrillation). Does not report any active bleeding problems. Additional cardiac regimen includes Norvasc, Imdur, Lopressor, Ranexa, and Crestor.  Past Medical History:  Diagnosis Date  . Arthritis    back   . Asthma    Childhood  . Chronic back pain   . Coronary atherosclerosis of native coronary artery    a. CABG 2004 with LIMA-LAD, SVG-D1, SVG-OM, SVG-PDA. b. Cath ~2010 with occ of SVG-diagonal, distal LAD and OM filiing by collaterals. c. NSTEMI 12/2014 s/p DES to SVG-RCA. d. 11/2015: DES to distal graft of the SVG-distal RCA  . Essential hypertension   . Foley catheter in place   . GERD (gastroesophageal reflux disease)   . Hard of hearing   . Hyperglycemia   . Ischemic cardiomyopathy    a. EF 45% by cath 12/2014.  . Mixed hyperlipidemia   . Myocardial infarction   . NSTEMI (non-ST elevated myocardial infarction) ([place]) 01/01/15  . Pneumonia    hx of   . Prostate cancer [facility])    Radiation therapy 1996  . Skin cancer   . Stroke [facility])    TIA- 28 years ago     Past Surgical History:  Procedure Laterality Date  . CARDIAC CATHETERIZATION  01/01/2015   Procedure: CORONARY  STENT INTERVENTION;  Surgeon: Peter M Martinique, MD;  Location: [facility] CATH LAB;  Service: Cardiovascular;;  SVG to PDA  . CARDIAC CATHETERIZATION N/A 11/21/2015   Procedure: Left Heart Cath and Cors/Grafts Angiography;  Surgeon: Troy Sine, MD;  Location: [place] CV LAB;  Service: Cardiovascular;  Laterality: N/A;  . CARDIAC CATHETERIZATION N/A 11/21/2015   Procedure: Coronary Stent Intervention;  Surgeon: Troy Sine, MD;  Location: [place] CV LAB;  Service: Cardiovascular;  Laterality: N/A;  . CARDIAC CATHETERIZATION N/A 09/11/2016   Procedure: Left Heart Cath and Cors/Grafts Angiography;  Surgeon: Leonie Man, MD;  Location: [place] CV LAB;  Service: Cardiovascular;  Laterality: N/A;  . CARDIAC CATHETERIZATION N/A 09/11/2016   Procedure: Coronary Stent Intervention;  Surgeon: Leonie Man, MD;  Location: [place] CV LAB;  Service: Cardiovascular;  Laterality: N/A;  . CORONARY ARTERY BYPASS GRAFT  2004   LIMA to LAD, SVG to diagonal, SVG to circumflex, SVG to PDA  . GREEN LIGHT LASER TURP (TRANSURETHRAL RESECTION OF PROSTATE N/A 06/04/2016   Procedure: GREEN LIGHT LASER TURP (TRANSURETHRAL RESECTION OF PROSTATE;  Surgeon: Irine Seal, MD;  [facility] ORS;  Service: Urology;  Laterality: N/A;  . LEFT HEART CATHETERIZATION WITH CORONARY/GRAFT ANGIOGRAM N/A 01/01/2015   Procedure: LEFT HEART CATHETERIZATION WITH Beatrix Fetters;  Surgeon: Peter M Martinique, MD;  Location: [facility] CATH LAB;  Service: Cardiovascular;  Laterality: N/A;  . PERCUTANEOUS CORONARY STENT INTERVENTION (PCI-S)  11/20/2015   distal SVG  with DES       . TONSILLECTOMY      Current Outpatient Prescriptions  Medication Sig Dispense Refill  . acetaminophen (TYLENOL) 325 MG tablet Take 2 tablets (650 mg total) by mouth every 4 (four) hours as needed for headache or mild pain.    Marland Kitchen amLODipine (NORVASC) 5 MG tablet Take 1 tablet (5 mg total) by mouth daily. 90 tablet 3  . apixaban (ELIQUIS) 5 MG TABS tablet  Take 1 tablet (5 mg total) by mouth 2 (two) times daily. 180 tablet 3  . bisacodyl (DULCOLAX) 5 MG EC tablet Take 5-10 mg by mouth daily as needed (constipation). Reported on 05/28/2016    . clopidogrel (PLAVIX) 75 MG tablet Take 1 tablet (75 mg total) by mouth daily. 90 tablet 3  . doxazosin (CARDURA) 4 MG tablet Take 1 tablet (4 mg total) by mouth daily. 90 tablet 3  . isosorbide mononitrate (IMDUR) 60 MG 24 hr tablet Take 1 tablet (60 mg total) by mouth every morning. 90 tablet 3  . metoprolol (LOPRESSOR) 50 MG tablet Take 1 tablet (50 mg total) by mouth 2 (two) times daily. 180 tablet 3  . nitroGLYCERIN (NITROSTAT) 0.4 MG SL tablet PLACE ONE (1) TABLET UNDER TONGUE EVERY 5 MINUTES UP TO (3) DOSES AS NEEDED FOR CHEST PAIN. 25 tablet 3  . pantoprazole (PROTONIX) 40 MG tablet Take 1 tablet (40 mg total) by mouth daily. 90 tablet 3  . PRESCRIPTION MEDICATION Hormone shots done at Dr. Ralene Muskrat office once every 4 months (for prostrate) - last injection mid December 2016    . ranolazine (RANEXA) 500 MG 12 hr tablet Take 1 tablet (500 mg total) by mouth 2 (two) times daily. 180 tablet 3  . rosuvastatin (CRESTOR) 20 MG tablet Take 1 tablet (20 mg total) by mouth daily. 90 tablet 3   No current facility-administered medications for this visit.    Allergies:  Patient has no known allergies.   Social History: The patient  reports that he quit smoking about 30 years ago. His smoking use included Cigars. He started smoking about 52 years ago. He has a 13.50 pack-year smoking history. He quit smokeless tobacco use about 6 years ago. His smokeless tobacco use included Chew. He reports that he does not drink alcohol or use drugs.   ROS:  Please see the history of present illness. Otherwise, complete review of systems is positive for hearing loss.  All other systems are reviewed and negative.   Physical Exam: VS:  BP 113/71   Pulse 67   Ht 5\' 8"  (1.727 m)   Wt 223 lb (101.2 kg)   BMI 33.91 kg/m , BMI Body  mass index is 33.91 kg/m.  Wt Readings from Last 3 Encounters:  02/12/17 223 lb (101.2 kg)  11/11/16 221 lb (100.2 kg)  09/23/16 222 lb (100.7 kg)    General: Patient appears comfortable at rest. HEENT: Conjunctiva and lids normal, oropharynx clear. Neck: Supple, no elevated JVP or carotid bruits, no thyromegaly. Lungs: Clear to auscultation, nonlabored breathing at rest. Cardiac: Regular rate and rhythm, no S3 or significant systolic murmur, no pericardial rub. Abdomen: Soft, nontender, bowel sounds present. Extremities: No pitting edema, distal pulses 2+. Skin: Warm and dry. Musculoskeletal: No kyphosis. Neuropsychiatric: Alert and oriented 3, affect appropriate.  ECG: I personally reviewed the tracing from 09/12/2016 which showed sinus rhythm with prolonged PR interval, leftward axis, nonspecific  ST-T changes.  Recent Labwork: 09/10/2016: ALT 24; AST 21; TSH 4.364 09/12/2016: BUN 17; Creatinine, Ser 1.09; Hemoglobin 12.3; Platelets 177; Potassium 3.8; Sodium 139     Component Value Date/Time   CHOL 167 09/11/2016 0434   TRIG 214 (H) 09/11/2016 0434   HDL 38 (L) 09/11/2016 0434   CHOLHDL 4.4 09/11/2016 0434   VLDL 43 (H) 09/11/2016 0434   LDLCALC 86 09/11/2016 0434    Other Studies Reviewed Today:  Cardiac catheterization 09/11/2016:  SVG-OM3: Culprit Tandem lesions - Mid Graft to Dist Graft lesion, 70 %stenosed. Dist Graft to Insertion lesion, 95 %stenosed.  A STENT PROMUS PREM MR 2.75X38 drug eluting stent was successfully placed covering both lesions. Post intervention, there is a 0% residual stenosis.   Additional Angiography:  Ost LAD lesion, 99 %stenosed. Prox LAD to Mid LAD lesion, 100 %stenosed after small D1.  LIMA-LAD and is normal in caliber. The LAD is grafted in between 2 diagonal branches. Downstream below the D3, Dist LAD lesion, 100 %stenosed - fills via collaterals from the circumflex system.  Prox Cx to Mid Cx lesion, 100 %stenosed. Ramus lesion,  99 %stenosed. - No change from previous cath  Prox RCA lesion, 100 %stenosed. Mid RCA to Dist RCA lesion, 100 %stenosed. The vessels actually more occluded than previously noted  SVG-dRCA: Prox Graft to Mid Graft stent, ~15 %stenosed, Dist Graft stent, 0 %stenosed.  SVG-Diag Origin lesion, 100 %stenosed.  The left ventricular systolic function is low normal. The left ventricular ejection fraction is 50-55% by visual estimate - there is a hint of possible mild anterolateral hypokinesis  LV end diastolic pressure is mildly elevated.  Successful PCI of tandem lesions in the SVG-OM 3. Otherwise angiographically similar coronary arteries and grafts compared to catheterization in January 2017.  Assessment and Plan:  1. Multivessel CAD status post CABG and subsequently documented graft disease requiring percutaneous interventions. Most recently he underwent DES to the SVG to OM 3. On medical therapy he remains clinically stable without progressive angina. No changes were made today. He was concerned about the cost of Ranexa, we might drop this if he remains stable.  2. Paroxysmal atrial fibrillation, no palpitations. He remains on Eliquis for stroke prophylaxis. No reported bleeding episodes.  3. Essential hypertension, blood pressure well controlled today. No changes were made.  4. Hyperlipidemia, on Crestor. No intolerances. Last LDL 86.  Current medicines were reviewed with the patient today.  Disposition: Follow-up in 6 months, sooner if needed.  Signed, Satira Sark, MD, [facility] 02/12/2017 1:43 PM    [place] at [place], [place], [place] [address] Phone: [phone]; Fax: [phone]

## 2017-02-12 ENCOUNTER — Encounter: Payer: Self-pay | Admitting: Cardiology

## 2017-02-12 ENCOUNTER — Ambulatory Visit (INDEPENDENT_AMBULATORY_CARE_PROVIDER_SITE_OTHER): Payer: Medicare Other | Admitting: Cardiology

## 2017-02-12 VITALS — BP 113/71 | HR 67 | Ht 68.0 in | Wt 223.0 lb

## 2017-02-12 DIAGNOSIS — E782 Mixed hyperlipidemia: Secondary | ICD-10-CM | POA: Diagnosis not present

## 2017-02-12 DIAGNOSIS — I480 Paroxysmal atrial fibrillation: Secondary | ICD-10-CM

## 2017-02-12 DIAGNOSIS — I10 Essential (primary) hypertension: Secondary | ICD-10-CM | POA: Diagnosis not present

## 2017-02-12 DIAGNOSIS — I25119 Atherosclerotic heart disease of native coronary artery with unspecified angina pectoris: Secondary | ICD-10-CM

## 2017-02-12 NOTE — Patient Instructions (Signed)
Medication Instructions:  Your physician recommends that you continue on your current medications as directed. Please refer to the Current Medication list given to you today.  Labwork: None  Testing/Procedures: None  Follow-Up: Your physician wants you to follow-up in: [place]. You will receive a reminder letter in the mail two months in advance. If you don't receive a letter, please call our office to schedule the follow-up appointment.   Any Other Special Instructions Will Be Listed Below (If Applicable).  If you need a refill on your cardiac medications before your next appointment, please call your pharmacy.

## 2017-03-02 ENCOUNTER — Other Ambulatory Visit: Payer: Self-pay

## 2017-03-02 MED ORDER — CLOPIDOGREL BISULFATE 75 MG PO TABS: 75.0000 mg | ORAL_TABLET | Freq: Every day | ORAL | 3 refills | Status: DC

## 2017-03-03 ENCOUNTER — Other Ambulatory Visit: Payer: Self-pay | Admitting: *Deleted

## 2017-03-03 MED ORDER — CLOPIDOGREL BISULFATE 75 MG PO TABS: 75.0000 mg | ORAL_TABLET | Freq: Every day | ORAL | 3 refills | Status: DC

## 2017-03-12 ENCOUNTER — Telehealth: Payer: Self-pay | Admitting: Cardiology

## 2017-03-12 NOTE — Telephone Encounter (Signed)
Pt would like to know if he needs to continue taking the ranolazine (RANEXA) 500 MG 12 hr tablet [[id number]]  If so he will need a refill --please give pt a call

## 2017-03-12 NOTE — Telephone Encounter (Signed)
This is an [place] patient. He has expressed concern already about the cost of Ranexa, I did ask nursing to check to see if we had any availability of samples or other assistance for him. He has been relatively stable however in terms of angina control and is on additional antianginal medications. If he would like to try off of the medication to see if this affects his symptom control, I would not be opposed.

## 2017-03-12 NOTE — Telephone Encounter (Signed)
Forwarded to MD.

## 2017-03-16 ENCOUNTER — Telehealth: Payer: Self-pay

## 2017-03-16 ENCOUNTER — Other Ambulatory Visit: Payer: Self-pay

## 2017-03-16 MED ORDER — ROSUVASTATIN CALCIUM 20 MG PO TABS: 20.0000 mg | ORAL_TABLET | Freq: Every day | ORAL | 6 refills | Status: DC

## 2017-03-16 MED ORDER — NITROGLYCERIN 0.4 MG SL SUBL: SUBLINGUAL_TABLET | SUBLINGUAL | 3 refills | Status: DC

## 2017-03-16 MED ORDER — CLOPIDOGREL BISULFATE 75 MG PO TABS: 75.0000 mg | ORAL_TABLET | Freq: Every day | ORAL | 6 refills | Status: DC

## 2017-03-16 MED ORDER — ISOSORBIDE MONONITRATE ER 60 MG PO TB24: 60.0000 mg | ORAL_TABLET | Freq: Every morning | ORAL | 6 refills | Status: DC

## 2017-03-16 MED ORDER — METOPROLOL TARTRATE 50 MG PO TABS: 50.0000 mg | ORAL_TABLET | Freq: Two times a day (BID) | ORAL | 6 refills | Status: DC

## 2017-03-16 MED ORDER — DOXAZOSIN MESYLATE 4 MG PO TABS: 4.0000 mg | ORAL_TABLET | Freq: Every day | ORAL | 6 refills | Status: DC

## 2017-03-16 MED ORDER — AMLODIPINE BESYLATE 5 MG PO TABS: 5.0000 mg | ORAL_TABLET | Freq: Every day | ORAL | 6 refills | Status: DC

## 2017-03-16 MED ORDER — APIXABAN 5 MG PO TABS: 5.0000 mg | ORAL_TABLET | Freq: Two times a day (BID) | ORAL | 6 refills | Status: DC

## 2017-03-16 NOTE — Telephone Encounter (Signed)
Acquanetta Chain, LPN: Advised patient of Dr. Chuck Hint recommendation regarding Ranexa from previous note. Patient verbalized understanding and very appreciative to not have to take another medication.

## 2017-03-16 NOTE — Telephone Encounter (Signed)
Advised patient of Dr. Chuck Hint recommendation regarding Ranexa from previous note. Patient verbalized understanding and very appreciative to not have to take another medication.

## 2017-04-20 ENCOUNTER — Other Ambulatory Visit: Payer: Self-pay | Admitting: Orthopedic Surgery

## 2017-05-06 ENCOUNTER — Ambulatory Visit (HOSPITAL_BASED_OUTPATIENT_CLINIC_OR_DEPARTMENT_OTHER): Admit: 2017-05-06 | Payer: Medicare Other | Admitting: Orthopedic Surgery

## 2017-05-06 ENCOUNTER — Encounter (HOSPITAL_BASED_OUTPATIENT_CLINIC_OR_DEPARTMENT_OTHER): Payer: Self-pay

## 2017-05-06 SURGERY — CARPAL TUNNEL RELEASE
Anesthesia: Choice | Laterality: Left

## 2017-05-18 ENCOUNTER — Telehealth: Payer: Self-pay | Admitting: *Deleted

## 2017-05-18 NOTE — Telephone Encounter (Signed)
TWILIGHT research 18 month (last) telephone follow up completed. Spoke with patient wife and explained that this phone call was just to thank him for participating in the study for the time that he did. The wife seemed a bit confused and told me the medication was "too expensive". I emphasized to her that he did not need to change anything that my phone call was just to check on him. Sh verbalized understanding and I encouraged her or him to call back if they had any questions.

## 2017-06-04 ENCOUNTER — Telehealth: Payer: Self-pay | Admitting: Cardiology

## 2017-06-04 MED ORDER — APIXABAN 5 MG PO TABS: 5.0000 mg | ORAL_TABLET | Freq: Two times a day (BID) | ORAL | 0 refills | Status: DC

## 2017-06-04 NOTE — Telephone Encounter (Signed)
Elijah Santana called stating that he needs to see if he can get samples of Eliquis. States that he is in the donut hole with his insurance.

## 2017-06-04 NOTE — Telephone Encounter (Signed)
Pt aware that samples were available for pickup - pt will come by this afternoon

## 2017-06-18 ENCOUNTER — Ambulatory Visit (INDEPENDENT_AMBULATORY_CARE_PROVIDER_SITE_OTHER): Payer: Medicare Other | Admitting: Urology

## 2017-06-18 DIAGNOSIS — N401 Enlarged prostate with lower urinary tract symptoms: Secondary | ICD-10-CM

## 2017-06-18 DIAGNOSIS — R351 Nocturia: Secondary | ICD-10-CM | POA: Diagnosis not present

## 2017-06-18 DIAGNOSIS — C61 Malignant neoplasm of prostate: Secondary | ICD-10-CM

## 2017-08-11 NOTE — Progress Notes (Signed)
Cardiology Office Note  Date: 08/12/2017   ID: Elijah Santana, Elijah Santana 1932-07-12, MRN [id number]  PCP: Glenda Chroman, MD  Primary Cardiologist: Rozann Lesches, MD   Chief Complaint  Patient presents with  . Coronary Artery Disease    History of Present Illness: Elijah Santana is an 83 y.o. male last seen in March. He presents for a routine follow-up visit. Fortunately, he has been doing well without significant angina symptoms on current medical regimen. He reports NYHA class II dyspnea. No palpitations or syncope.  Cardiac regimen as outlined below. He reports compliance and no obvious intolerances. He has had no bleeding problems on both request and Plavix. We discussed obtaining follow-up lab work for surveillance. Blood pressure and heart rate are well controlled today.  Cardiac catheterization in October 2017 revealed new disease within the SVG to OM 3 which was managed with DES intervention. Otherwise patent stent site noted in the SVG to RCA, patent LIMA to LAD with distal LAD occlusion and left to left collaterals, and known occlusion of the SVG to diagonal.  Past Medical History:  Diagnosis Date  . Arthritis   . Asthma    Childhood  . Chronic back pain   . Coronary atherosclerosis of native coronary artery    a. CABG 2004 with LIMA-LAD, SVG-D1, SVG-OM, SVG-PDA. b. Cath ~2010 with occ of SVG-diagonal, distal LAD and OM filiing by collaterals. c. NSTEMI 12/2014 s/p DES to SVG-RCA. d. 11/2015: DES to distal graft of the SVG-distal RCA  . Essential hypertension   . Foley catheter in place   . GERD (gastroesophageal reflux disease)   . Hard of hearing   . Hyperglycemia   . Ischemic cardiomyopathy    a. EF 45% by cath 12/2014.  . Mixed hyperlipidemia   . Myocardial infarction ([place])   . NSTEMI (non-ST elevated myocardial infarction) ([place]) 01/01/15  . Pneumonia    hx of   . Prostate cancer [facility])    Radiation therapy 1996  . Skin cancer   . Stroke [facility])    TIA- 28 years ago       Past Surgical History:  Procedure Laterality Date  . CARDIAC CATHETERIZATION  01/01/2015   Procedure: CORONARY STENT INTERVENTION;  Surgeon: Peter M Martinique, MD;  Location: [facility] CATH LAB;  Service: Cardiovascular;;  SVG to PDA  . CARDIAC CATHETERIZATION N/A 11/21/2015   Procedure: Left Heart Cath and Cors/Grafts Angiography;  Surgeon: Troy Sine, MD;  Location: [place] CV LAB;  Service: Cardiovascular;  Laterality: N/A;  . CARDIAC CATHETERIZATION N/A 11/21/2015   Procedure: Coronary Stent Intervention;  Surgeon: Troy Sine, MD;  Location: [place] CV LAB;  Service: Cardiovascular;  Laterality: N/A;  . CARDIAC CATHETERIZATION N/A 09/11/2016   Procedure: Left Heart Cath and Cors/Grafts Angiography;  Surgeon: Leonie Man, MD;  Location: [place] CV LAB;  Service: Cardiovascular;  Laterality: N/A;  . CARDIAC CATHETERIZATION N/A 09/11/2016   Procedure: Coronary Stent Intervention;  Surgeon: Leonie Man, MD;  Location: [place] CV LAB;  Service: Cardiovascular;  Laterality: N/A;  . CORONARY ARTERY BYPASS GRAFT  2004   LIMA to LAD, SVG to diagonal, SVG to circumflex, SVG to PDA  . GREEN LIGHT LASER TURP (TRANSURETHRAL RESECTION OF PROSTATE N/A 06/04/2016   Procedure: GREEN LIGHT LASER TURP (TRANSURETHRAL RESECTION OF PROSTATE;  Surgeon: Irine Seal, MD;  Location: [facility] ORS;  Service: Urology;  Laterality: N/A;  . LEFT HEART CATHETERIZATION WITH CORONARY/GRAFT ANGIOGRAM N/A 01/01/2015   Procedure:  LEFT HEART CATHETERIZATION WITH Beatrix Fetters;  Surgeon: Peter M Martinique, MD;  Location: [facility] CATH LAB;  Service: Cardiovascular;  Laterality: N/A;  . PERCUTANEOUS CORONARY STENT INTERVENTION (PCI-S)  11/20/2015   distal SVG  with DES       . TONSILLECTOMY      Current Outpatient Prescriptions  Medication Sig Dispense Refill  . acetaminophen (TYLENOL) 325 MG tablet Take 2 tablets (650 mg total) by mouth every 4 (four) hours as needed for headache or mild pain.    Marland Kitchen amLODipine  (NORVASC) 5 MG tablet Take 1 tablet (5 mg total) by mouth daily. 30 tablet 6  . apixaban (ELIQUIS) 5 MG TABS tablet Take 1 tablet (5 mg total) by mouth 2 (two) times daily. 42 tablet 0  . bisacodyl (DULCOLAX) 5 MG EC tablet Take 5-10 mg by mouth daily as needed (constipation). Reported on 05/28/2016    . clopidogrel (PLAVIX) 75 MG tablet Take 1 tablet (75 mg total) by mouth daily. 30 tablet 6  . doxazosin (CARDURA) 4 MG tablet Take 1 tablet (4 mg total) by mouth daily. 30 tablet 6  . isosorbide mononitrate (IMDUR) 60 MG 24 hr tablet Take 1 tablet (60 mg total) by mouth every morning. 30 tablet 6  . metoprolol (LOPRESSOR) 50 MG tablet Take 1 tablet (50 mg total) by mouth 2 (two) times daily. 60 tablet 6  . nitroGLYCERIN (NITROSTAT) 0.4 MG SL tablet PLACE ONE (1) TABLET UNDER TONGUE EVERY 5 MINUTES UP TO (3) DOSES AS NEEDED FOR CHEST PAIN. 25 tablet 3  . pantoprazole (PROTONIX) 40 MG tablet Take 1 tablet (40 mg total) by mouth daily. 90 tablet 3  . PRESCRIPTION MEDICATION Hormone shots done at Dr. Ralene Muskrat office once every 4 months (for prostrate) - last injection mid December 2016    . rosuvastatin (CRESTOR) 20 MG tablet Take 1 tablet (20 mg total) by mouth daily. 30 tablet 6   No current facility-administered medications for this visit.    Allergies:  Patient has no known allergies.   Social History: The patient  reports that he quit smoking about 31 years ago. His smoking use included Cigars. He started smoking about 53 years ago. He has a 13.50 pack-year smoking history. He quit smokeless tobacco use about 7 years ago. His smokeless tobacco use included Chew. He reports that he does not drink alcohol or use drugs.   ROS:  Please see the history of present illness. Otherwise, complete review of systems is positive for arthritic stiffness.  All other systems are reviewed and negative.   Physical Exam: VS:  BP 130/78   Pulse 64   Ht 5\' 8"  (1.727 m)   Wt 223 lb (101.2 kg)   SpO2 98%   BMI  33.91 kg/m , BMI Body mass index is 33.91 kg/m.  Wt Readings from Last 3 Encounters:  08/12/17 223 lb (101.2 kg)  02/12/17 223 lb (101.2 kg)  11/11/16 221 lb (100.2 kg)    General: Elderly male, appears comfortable at rest. HEENT: Conjunctiva and lids normal, oropharynx clear. Neck: Supple, no elevated JVP or carotid bruits, no thyromegaly. Lungs: Clear to auscultation, nonlabored breathing at rest. Cardiac: Regular rate and rhythm, no S3 or significant systolic murmur, no pericardial rub. Abdomen: Soft, nontender, bowel sounds present. Extremities: Trace ankle edema, distal pulses 2+. Skin: Warm and dry. Musculoskeletal: No kyphosis. Neuropsychiatric: Alert and oriented x3, affect grossly appropriate.  ECG: I personally reviewed the tracing from 09/12/2016 which showed sinus rhythm with prolonged PR interval,  left anterior fascicular block, and nonspecific ST changes.  Recent Labwork: 09/10/2016: ALT 24; AST 21; TSH 4.364 09/12/2016: BUN 17; Creatinine, Ser 1.09; Hemoglobin 12.3; Platelets 177; Potassium 3.8; Sodium 139     Component Value Date/Time   CHOL 167 09/11/2016 0434   TRIG 214 (H) 09/11/2016 0434   HDL 38 (L) 09/11/2016 0434   CHOLHDL 4.4 09/11/2016 0434   VLDL 43 (H) 09/11/2016 0434   LDLCALC 86 09/11/2016 0434    Other Studies Reviewed Today:  Cardiac catheterization 09/11/2016:  SVG-OM3: Culprit Tandem lesions - Mid Graft to Dist Graft lesion, 70 %stenosed. Dist Graft to Insertion lesion, 95 %stenosed.  A STENT PROMUS PREM MR 2.75X38 drug eluting stent was successfully placed covering both lesions. Post intervention, there is a 0% residual stenosis.   Additional Angiography:  Ost LAD lesion, 99 %stenosed. Prox LAD to Mid LAD lesion, 100 %stenosed after small D1.  LIMA-LAD and is normal in caliber. The LAD is grafted in between 2 diagonal branches. Downstream below the D3, Dist LAD lesion, 100 %stenosed - fills via collaterals from the circumflex  system.  Prox Cx to Mid Cx lesion, 100 %stenosed. Ramus lesion, 99 %stenosed. - No change from previous cath  Prox RCA lesion, 100 %stenosed. Mid RCA to Dist RCA lesion, 100 %stenosed. The vessels actually more occluded than previously noted  SVG-dRCA: Prox Graft to Mid Graft stent, ~15 %stenosed, Dist Graft stent, 0 %stenosed.  SVG-Diag Origin lesion, 100 %stenosed.  The left ventricular systolic function is low normal. The left ventricular ejection fraction is 50-55% by visual estimate - there is a hint of possible mild anterolateral hypokinesis  LV end diastolic pressure is mildly elevated.  Successful PCI of tandem lesions in the SVG-OM 3. Otherwise angiographically similar coronary arteries and grafts compared to catheterization in January 2017.  Assessment and Plan:  1. Multivessel CAD status post CABG with graft disease and multiple percutaneous interventions. Last PCI was DES to the SVG to OM 3 as detailed above. He is doing well at this time without significant angina on medical therapy. No changes were made.  2. Paroxysmal atrial fibrillation, continues on Eliquis for stroke prophylaxis. He reports no palpitations. Follow-up CBC and BMET. He reports no bleeding problems.  3. Essential hypertension, blood pressure is adequately controlled today. Continue with current regimen.  4. Hyperlipidemia, GERD and is on Crestor. Follow-up fasting lipid panel and LFTs.  Current medicines were reviewed with the patient today.   Orders Placed This Encounter  Procedures  . Lipid panel  . Comprehensive metabolic panel  . CBC  . EKG 12-Lead    Disposition: Follow-up in 6 months.  Signed, Satira Sark, MD, [facility] 08/12/2017 12:22 PM    [facility] [address], [place], [place] [address] Phone: [phone]; Fax: [phone]

## 2017-08-12 ENCOUNTER — Encounter: Payer: Self-pay | Admitting: Cardiology

## 2017-08-12 ENCOUNTER — Ambulatory Visit (INDEPENDENT_AMBULATORY_CARE_PROVIDER_SITE_OTHER): Payer: Medicare Other | Admitting: Cardiology

## 2017-08-12 VITALS — BP 130/78 | HR 64 | Ht 68.0 in | Wt 223.0 lb

## 2017-08-12 DIAGNOSIS — I480 Paroxysmal atrial fibrillation: Secondary | ICD-10-CM | POA: Diagnosis not present

## 2017-08-12 DIAGNOSIS — I25119 Atherosclerotic heart disease of native coronary artery with unspecified angina pectoris: Secondary | ICD-10-CM

## 2017-08-12 DIAGNOSIS — I10 Essential (primary) hypertension: Secondary | ICD-10-CM

## 2017-08-12 DIAGNOSIS — E782 Mixed hyperlipidemia: Secondary | ICD-10-CM | POA: Diagnosis not present

## 2017-08-12 NOTE — Patient Instructions (Signed)
Medication Instructions:  Continue all current medications.  Labwork:  FLP, CMET, CBC - orders given today.  Reminder:  Nothing to eat or drink after 12 midnight prior to labs.  Office will contact with results via phone or letter.    Testing/Procedures: none  Follow-Up: Your physician wants you to follow up in: 6 months.  You will receive a reminder letter in the mail one-two months in advance.  If you don't receive a letter, please call our office to schedule the follow up appointment   Any Other Special Instructions Will Be Listed Below (If Applicable).  If you need a refill on your cardiac medications before your next appointment, please call your pharmacy.

## 2017-08-20 ENCOUNTER — Telehealth: Payer: Self-pay | Admitting: Cardiology

## 2017-08-20 NOTE — Telephone Encounter (Signed)
Elijah Santana would like to speak with Nurse in regards to having an upcoming appointment with Dermatology to have A lesion removed.

## 2017-08-20 NOTE — Telephone Encounter (Signed)
LM to return call.

## 2017-08-24 NOTE — Telephone Encounter (Addendum)
Has place on back that has been there a while that may be skin cancer - now wants to have taken care of.  Suggested he have the dermatologist fax request over with procedure they are planning on doing & what medications they want to hold.  Patient verbalized understanding & will move forward with seeing the dermatologist first.

## 2017-10-18 ENCOUNTER — Telehealth: Payer: Self-pay | Admitting: Cardiology

## 2017-10-18 MED ORDER — APIXABAN 5 MG PO TABS: 5.0000 mg | ORAL_TABLET | Freq: Two times a day (BID) | ORAL | 0 refills | Status: DC

## 2017-10-18 NOTE — Telephone Encounter (Signed)
Patient notified samples ready for pick up

## 2017-10-18 NOTE — Telephone Encounter (Signed)
Patient called requesting samples of Eliquis.

## 2017-10-22 ENCOUNTER — Ambulatory Visit: Payer: Medicare Other | Admitting: Urology

## 2017-10-22 DIAGNOSIS — C61 Malignant neoplasm of prostate: Secondary | ICD-10-CM | POA: Diagnosis not present

## 2017-10-22 DIAGNOSIS — R351 Nocturia: Secondary | ICD-10-CM

## 2017-10-22 DIAGNOSIS — R9721 Rising PSA following treatment for malignant neoplasm of prostate: Secondary | ICD-10-CM

## 2017-10-22 DIAGNOSIS — N401 Enlarged prostate with lower urinary tract symptoms: Secondary | ICD-10-CM

## 2017-10-26 ENCOUNTER — Other Ambulatory Visit: Payer: Self-pay | Admitting: Urology

## 2017-10-26 DIAGNOSIS — C61 Malignant neoplasm of prostate: Secondary | ICD-10-CM

## 2017-10-26 DIAGNOSIS — R9721 Rising PSA following treatment for malignant neoplasm of prostate: Secondary | ICD-10-CM

## 2017-10-28 ENCOUNTER — Ambulatory Visit (HOSPITAL_COMMUNITY)
Admission: RE | Admit: 2017-10-28 | Discharge: 2017-10-28 | Disposition: A | Discharge status: Home / Self Care | Payer: Medicare Other | Source: Ambulatory Visit | Attending: Urology | Admitting: Urology

## 2017-10-28 DIAGNOSIS — K5730 Diverticulosis of large intestine without perforation or abscess without bleeding: Secondary | ICD-10-CM | POA: Diagnosis not present

## 2017-10-28 DIAGNOSIS — C61 Malignant neoplasm of prostate: Secondary | ICD-10-CM | POA: Diagnosis present

## 2017-10-28 DIAGNOSIS — N320 Bladder-neck obstruction: Secondary | ICD-10-CM | POA: Insufficient documentation

## 2017-10-28 DIAGNOSIS — R9721 Rising PSA following treatment for malignant neoplasm of prostate: Secondary | ICD-10-CM

## 2017-10-28 DIAGNOSIS — I700 Atherosclerosis of aorta: Secondary | ICD-10-CM | POA: Insufficient documentation

## 2017-10-28 LAB — POCT I-STAT CREATININE: CREATININE: 0.9 mg/dL (ref 0.61–1.24)

## 2017-10-28 MED ORDER — IOPAMIDOL (ISOVUE-300) INJECTION 61%
100.0000 mL | Freq: Once | INTRAVENOUS | Status: AC | PRN
  Administered 2017-10-28: 100 mL via INTRAVENOUS

## 2017-10-29 ENCOUNTER — Ambulatory Visit: Payer: Medicare Other | Admitting: Urology

## 2017-10-29 DIAGNOSIS — C641 Malignant neoplasm of right kidney, except renal pelvis: Secondary | ICD-10-CM | POA: Diagnosis not present

## 2017-11-12 ENCOUNTER — Ambulatory Visit: Payer: Medicare Other | Admitting: Urology

## 2017-11-12 DIAGNOSIS — R310 Gross hematuria: Secondary | ICD-10-CM | POA: Diagnosis not present

## 2017-11-12 DIAGNOSIS — C61 Malignant neoplasm of prostate: Secondary | ICD-10-CM

## 2017-11-12 DIAGNOSIS — R9721 Rising PSA following treatment for malignant neoplasm of prostate: Secondary | ICD-10-CM | POA: Diagnosis not present

## 2017-11-12 DIAGNOSIS — Z192 Hormone resistant malignancy status: Secondary | ICD-10-CM | POA: Diagnosis not present

## 2017-11-12 DIAGNOSIS — R351 Nocturia: Secondary | ICD-10-CM

## 2017-11-17 ENCOUNTER — Encounter (HOSPITAL_COMMUNITY): Payer: Self-pay | Admitting: Hematology and Oncology

## 2017-11-17 ENCOUNTER — Other Ambulatory Visit: Payer: Self-pay

## 2017-11-17 ENCOUNTER — Inpatient Hospital Stay (HOSPITAL_COMMUNITY): Payer: Medicare Other | Attending: Hematology and Oncology | Admitting: Hematology and Oncology

## 2017-11-17 VITALS — BP 141/67 | HR 62 | Resp 18 | Ht 68.0 in | Wt 232.0 lb

## 2017-11-17 DIAGNOSIS — R351 Nocturia: Secondary | ICD-10-CM | POA: Insufficient documentation

## 2017-11-17 DIAGNOSIS — Z87891 Personal history of nicotine dependence: Secondary | ICD-10-CM | POA: Insufficient documentation

## 2017-11-17 DIAGNOSIS — M7989 Other specified soft tissue disorders: Secondary | ICD-10-CM | POA: Insufficient documentation

## 2017-11-17 DIAGNOSIS — C61 Malignant neoplasm of prostate: Secondary | ICD-10-CM | POA: Diagnosis not present

## 2017-11-17 DIAGNOSIS — Z79899 Other long term (current) drug therapy: Secondary | ICD-10-CM | POA: Insufficient documentation

## 2017-11-17 DIAGNOSIS — Z7901 Long term (current) use of anticoagulants: Secondary | ICD-10-CM | POA: Insufficient documentation

## 2017-11-17 DIAGNOSIS — C7951 Secondary malignant neoplasm of bone: Secondary | ICD-10-CM | POA: Insufficient documentation

## 2017-11-17 DIAGNOSIS — K5900 Constipation, unspecified: Secondary | ICD-10-CM | POA: Insufficient documentation

## 2017-11-22 ENCOUNTER — Other Ambulatory Visit: Payer: Self-pay | Admitting: Cardiology

## 2017-11-27 ENCOUNTER — Encounter (HOSPITAL_COMMUNITY): Payer: Self-pay | Admitting: Hematology and Oncology

## 2017-11-27 DIAGNOSIS — C61 Malignant neoplasm of prostate: Secondary | ICD-10-CM | POA: Insufficient documentation

## 2017-11-27 NOTE — Progress Notes (Signed)
[facility] New Visit:  Assessment: Prostate cancer [facility]) 84 y.o. 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  Subsequently, patient has developed suspected biochemical recurrence and has been started on androgen deprivation therapy.  Recently, PSA has been rising in the context of obstructive urinary symptoms.  Restaging imaging with bone scan and CT of the abdomen pelvis did not demonstrate any convincing evidence of metastatic disease at this point.  Patient has been referred to [place] for possible initiation of next line of therapy.    Based on available PSA data, [facility] nomogram demonstrates PSA doubling time of 24.2 months   Plan: -- We need better definition imaging to confirm absence of obvious metastatic disease and to confirm presence of detectable disease in the prostate: For the purpose, will obtain Aximum PET-CT  --Return to clinic in 2 weeks with lab work to review the imaging results.  If no obvious metastatic disease is discovered and no significant volume of the disease in the prostate, patient can remain on observation at this time due to slow trend of PSA progression.  If metastatic disease is discovered, we do have possible treatment depending on locations of the metastatic lesions.      Orders Placed This Encounter  Procedures  . NM PET (AXUMIN) SKULL BASE TO MID THIGH    Standing Status:   Future    Standing Expiration Date:   01/16/2019    Order Specific Question:   If indicated for the ordered procedure, I authorize the administration of a radiopharmaceutical per Radiology protocol    Answer:   Yes    Order Specific Question:   Preferred imaging location?    Answer:   [facility]    Order Specific Question:   Radiology Contrast Protocol - do NOT remove file path    Answer:   [email or web address]    All questions were answered.  . The patient knows to call the clinic with any  problems, questions or concerns.  This note was electronically signed.    History of Presenting Illness TRAMAINE SAULS 84 y.o. presenting to the [place] for rising PSA in the context of previous diagnosis of adenocarcinoma of the prostate in 1996 treated with radiotherapy subsequently, patient has developed biochemical recurrence and has been receiving Lupron androgen deprivation therapy.  Patient recently has been experiencing increasing voiding difficulties.  He is being followed by Dr Irine Seal.  Patient's past medical history dominated by significant cardiovascular morbidity including severe coronary artery disease, ischemic cardiomyopathy with decreased performance status and decreased left ventricular ejection fraction as of February 2016.  Patient is currently being considered for possible TURP in attempt to improve his voiding.  Presently, patient denies  Any fevers, chills, but does experience occasional night sweats.  He has generalized fatigue and chronic numbness and burning in his hands.  He has had bilateral lower extremity swelling.  No history of DVT.  He denies any new abdominal discomfort, or skeletal pain.  Oncological/hematological History: --Bone Scan, 10/02/16: No evidence of skeletal metastatic disease --CT A/P, 10/02/16: Evidence of metastatic disease. --Labs, 09/23/17: PSA 19.9 --CT A/P, 10/28/17: No soft tissue, bone, or lymph node abnormalities to suggest metastatic disease.  Stable size of a small prostate with previous TURP defect.  Medical History: Past Medical History:  Diagnosis Date  . Arthritis   . Asthma    Childhood  . Chronic back pain   . Coronary atherosclerosis of native coronary artery  a. CABG 2004 with LIMA-LAD, SVG-D1, SVG-OM, SVG-PDA. b. Cath ~2010 with occ of SVG-diagonal, distal LAD and OM filiing by collaterals. c. NSTEMI 12/2014 s/p DES to SVG-RCA. d. 11/2015: DES to distal graft of the SVG-distal RCA  . Essential hypertension   . Foley  catheter in place   . GERD (gastroesophageal reflux disease)   . Hard of hearing   . Hyperglycemia   . Ischemic cardiomyopathy    a. EF 45% by cath 12/2014.  . Mixed hyperlipidemia   . Myocardial infarction ([place])   . NSTEMI (non-ST elevated myocardial infarction) ([place]) 01/01/15  . Pneumonia    hx of   . Prostate cancer [facility])    Radiation therapy 1996  . Skin cancer   . Stroke [facility])    TIA- 28 years ago     Surgical History: Past Surgical History:  Procedure Laterality Date  . CARDIAC CATHETERIZATION  01/01/2015   Procedure: CORONARY STENT INTERVENTION;  Surgeon: Peter M Martinique, MD;  Location: [facility] CATH LAB;  Service: Cardiovascular;;  SVG to PDA  . CARDIAC CATHETERIZATION N/A 11/21/2015   Procedure: Left Heart Cath and Cors/Grafts Angiography;  Surgeon: Troy Sine, MD;  Location: [place] CV LAB;  Service: Cardiovascular;  Laterality: N/A;  . CARDIAC CATHETERIZATION N/A 11/21/2015   Procedure: Coronary Stent Intervention;  Surgeon: Troy Sine, MD;  Location: [place] CV LAB;  Service: Cardiovascular;  Laterality: N/A;  . CARDIAC CATHETERIZATION N/A 09/11/2016   Procedure: Left Heart Cath and Cors/Grafts Angiography;  Surgeon: Leonie Man, MD;  Location: [place] CV LAB;  Service: Cardiovascular;  Laterality: N/A;  . CARDIAC CATHETERIZATION N/A 09/11/2016   Procedure: Coronary Stent Intervention;  Surgeon: Leonie Man, MD;  Location: [place] CV LAB;  Service: Cardiovascular;  Laterality: N/A;  . CORONARY ARTERY BYPASS GRAFT  2004   LIMA to LAD, SVG to diagonal, SVG to circumflex, SVG to PDA  . GREEN LIGHT LASER TURP (TRANSURETHRAL RESECTION OF PROSTATE N/A 06/04/2016   Procedure: GREEN LIGHT LASER TURP (TRANSURETHRAL RESECTION OF PROSTATE;  Surgeon: Irine Seal, MD;  Location: [facility] ORS;  Service: Urology;  Laterality: N/A;  . LEFT HEART CATHETERIZATION WITH CORONARY/GRAFT ANGIOGRAM N/A 01/01/2015   Procedure: LEFT HEART CATHETERIZATION WITH Beatrix Fetters;   Surgeon: Peter M Martinique, MD;  Location: [facility] CATH LAB;  Service: Cardiovascular;  Laterality: N/A;  . PERCUTANEOUS CORONARY STENT INTERVENTION (PCI-S)  11/20/2015   distal SVG  with DES       . TONSILLECTOMY      Family History: Family History  Problem Relation Age of Onset  . Hypertension Father   . Coronary artery disease Father     Social History: Social History   Socioeconomic History  . Marital status: Married    Spouse name: Not on file  . Number of children: Not on file  . Years of education: Not on file  . Highest education level: Not on file  Social Needs  . Financial resource strain: Not on file  . Food insecurity - worry: Not on file  . Food insecurity - inability: Not on file  . Transportation needs - medical: Not on file  . Transportation needs - non-medical: Not on file  Occupational History  . Occupation: Retired    Comment: Office manager  Tobacco Use  . Smoking status: Former Smoker    Packs/day: 0.50    Years: 27.00    Pack years: 13.50    Types: Cigars    Start date: 07/28/1964  Last attempt to quit: 07/28/1986    Years since quitting: 31.3  . Smokeless tobacco: Former Systems developer    Types: Chew    Quit date: 08/07/2010  . Tobacco comment: never chewed up over a pack/day  Substance and Sexual Activity  . Alcohol use: No    Alcohol/week: 0.0 oz  . Drug use: No  . Sexual activity: Not on file  Other Topics Concern  . Not on file  Social History Narrative  . Not on file    Allergies: No Known Allergies  Medications:  Current Outpatient Medications  Medication Sig Dispense Refill  . acetaminophen (TYLENOL) 325 MG tablet Take 2 tablets (650 mg total) by mouth every 4 (four) hours as needed for headache or mild pain.    Marland Kitchen apixaban (ELIQUIS) 5 MG TABS tablet Take 1 tablet (5 mg total) by mouth 2 (two) times daily. 56 tablet 0  . bisacodyl (DULCOLAX) 5 MG EC tablet Take 5-10 mg by mouth daily as needed (constipation). Reported on 05/28/2016     . doxazosin (CARDURA) 4 MG tablet Take 1 tablet (4 mg total) by mouth daily. 30 tablet 6  . metoprolol (LOPRESSOR) 50 MG tablet Take 1 tablet (50 mg total) by mouth 2 (two) times daily. 60 tablet 6  . nitroGLYCERIN (NITROSTAT) 0.4 MG SL tablet PLACE ONE (1) TABLET UNDER TONGUE EVERY 5 MINUTES UP TO (3) DOSES AS NEEDED FOR CHEST PAIN. 25 tablet 3  . pantoprazole (PROTONIX) 40 MG tablet Take 1 tablet (40 mg total) by mouth daily. 90 tablet 3  . PRESCRIPTION MEDICATION Hormone shots done at Dr. Ralene Muskrat office once every 4 months (for prostrate) - last injection mid December 2016    . amLODipine (NORVASC) 5 MG tablet TAKE ONE TABLET BY MOUTH DAILY (MORNING) 30 tablet 3  . clopidogrel (PLAVIX) 75 MG tablet TAKE ONE TABLET BY MOUTH DAILY (,EVENING) 30 tablet 3  . isosorbide mononitrate (IMDUR) 60 MG 24 hr tablet TAKE ONE TABLET BY MOUTH IN THE MORNING 30 tablet 3  . rosuvastatin (CRESTOR) 20 MG tablet TAKE ONE TABLET BY MOUTH DAILY (,EVENING) 30 tablet 3   No current facility-administered medications for this visit.     Review of Systems: Review of Systems  Constitutional: Positive for diaphoresis and fatigue.  Cardiovascular: Positive for leg swelling.  Gastrointestinal: Positive for constipation.  Musculoskeletal: Positive for arthralgias.  Neurological: Positive for numbness.  Hematological: Bruises/bleeds easily.     PHYSICAL EXAMINATION Blood pressure (!) 141/67, pulse 62, resp. rate 18, height 5\' 8"  (1.727 m), weight 232 lb (105.2 kg), SpO2 97 %.  ECOG PERFORMANCE STATUS: 1 - Symptomatic but completely ambulatory  Physical Exam  Constitutional: He is oriented to person, place, and time and well-developed, well-nourished, and in no distress. No distress.  HENT:  Head: Normocephalic and atraumatic.  Mouth/Throat: Oropharynx is clear and moist. No oropharyngeal exudate.  Eyes: Conjunctivae and EOM are normal. Pupils are equal, round, and reactive to light. No scleral icterus.  Neck:  No thyromegaly present.  Cardiovascular: Normal rate, regular rhythm and intact distal pulses.  No murmur heard. Pulmonary/Chest: Breath sounds normal. No respiratory distress. He has no wheezes. He has no rales.  Abdominal: Soft. Bowel sounds are normal. He exhibits no distension. There is no tenderness. There is no rebound.  Musculoskeletal: He exhibits no edema.  Lymphadenopathy:    He has no cervical adenopathy.  Neurological: He is alert and oriented to person, place, and time. He has normal reflexes. No cranial nerve deficit.  Skin: Skin is warm and dry. No rash noted. He is not diaphoretic. No erythema.     LABORATORY DATA: I have personally reviewed the data as listed: No visits with results within 1 Week(s) from this visit.  Latest known visit with results is:  Hospital Outpatient Visit on 10/28/2017  Component Date Value Ref Range Status  . Creatinine, Ser 10/28/2017 0.90  0.61 - 1.24 mg/dL Final         Ardath Sax, MD

## 2017-11-27 NOTE — Assessment & Plan Note (Signed)
84 y.o. 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  Subsequently, patient has developed suspected biochemical recurrence and has been started on androgen deprivation therapy.  Recently, PSA has been rising in the context of obstructive urinary symptoms.  Restaging imaging with bone scan and CT of the abdomen pelvis did not demonstrate any convincing evidence of metastatic disease at this point.  Patient has been referred to [place] for possible initiation of next line of therapy.    Based on available PSA data, [facility] nomogram demonstrates PSA doubling time of 24.2 months   Plan: -- We need better definition imaging to confirm absence of obvious metastatic disease and to confirm presence of detectable disease in the prostate: For the purpose, will obtain Aximum PET-CT  --Return to clinic in 2 weeks with lab work to review the imaging results.  If no obvious metastatic disease is discovered and no significant volume of the disease in the prostate, patient can remain on observation at this time due to slow trend of PSA progression.  If metastatic disease is discovered, we do have possible treatment depending on locations of the metastatic lesions.

## 2017-11-30 ENCOUNTER — Ambulatory Visit (HOSPITAL_COMMUNITY): Payer: Medicare Other | Admitting: Hematology and Oncology

## 2017-12-07 ENCOUNTER — Encounter (HOSPITAL_COMMUNITY)
Admission: RE | Admit: 2017-12-07 | Discharge: 2017-12-07 | Disposition: A | Discharge status: Home / Self Care | Payer: Medicare Other | Source: Ambulatory Visit | Attending: Hematology and Oncology | Admitting: Hematology and Oncology

## 2017-12-07 DIAGNOSIS — C61 Malignant neoplasm of prostate: Secondary | ICD-10-CM | POA: Diagnosis present

## 2017-12-07 MED ORDER — AXUMIN (FLUCICLOVINE F 18) INJECTION: 9.7000 | Freq: Once | INTRAVENOUS | Status: DC

## 2017-12-08 ENCOUNTER — Ambulatory Visit (HOSPITAL_COMMUNITY): Payer: Medicare Other | Admitting: Internal Medicine

## 2017-12-13 ENCOUNTER — Ambulatory Visit (HOSPITAL_COMMUNITY): Payer: Medicare Other | Admitting: Internal Medicine

## 2017-12-15 ENCOUNTER — Encounter (HOSPITAL_COMMUNITY): Payer: Self-pay | Admitting: Adult Health

## 2017-12-15 ENCOUNTER — Inpatient Hospital Stay (HOSPITAL_COMMUNITY): Payer: Medicare Other | Admitting: Adult Health

## 2017-12-15 VITALS — BP 134/65 | HR 60 | Temp 98.0°F | Resp 18 | Wt 225.0 lb

## 2017-12-15 DIAGNOSIS — Z7901 Long term (current) use of anticoagulants: Secondary | ICD-10-CM | POA: Diagnosis not present

## 2017-12-15 DIAGNOSIS — C7951 Secondary malignant neoplasm of bone: Secondary | ICD-10-CM

## 2017-12-15 DIAGNOSIS — M7989 Other specified soft tissue disorders: Secondary | ICD-10-CM | POA: Diagnosis not present

## 2017-12-15 DIAGNOSIS — R351 Nocturia: Secondary | ICD-10-CM

## 2017-12-15 DIAGNOSIS — K5900 Constipation, unspecified: Secondary | ICD-10-CM | POA: Diagnosis not present

## 2017-12-15 DIAGNOSIS — Z87891 Personal history of nicotine dependence: Secondary | ICD-10-CM | POA: Diagnosis not present

## 2017-12-15 DIAGNOSIS — Z79899 Other long term (current) drug therapy: Secondary | ICD-10-CM | POA: Diagnosis not present

## 2017-12-15 DIAGNOSIS — C61 Malignant neoplasm of prostate: Secondary | ICD-10-CM | POA: Diagnosis not present

## 2017-12-15 NOTE — Patient Instructions (Signed)
[name] at [facility] Discharge Instructions  RECOMMENDATIONS MADE BY THE CONSULTANT AND ANY TEST RESULTS WILL BE SENT TO YOUR REFERRING PHYSICIAN.  You were seen today by Mike Craze NP. Return in 3 months for lab and follow up.   Thank you for choosing Elijah Santana at [facility] to provide your oncology and hematology care.  To afford each patient quality time with our provider, please arrive at least 15 minutes before your scheduled appointment time.    If you have a lab appointment with the [facility] please come in thru the  Main Entrance and check in at the main information desk  You need to re-schedule your appointment should you arrive 10 or more minutes late.  We strive to give you quality time with our providers, and arriving late affects you and other patients whose appointments are after yours.  Also, if you no show three or more times for appointments you may be dismissed from the clinic at the providers discretion.     Again, thank you for choosing [facility].  Our hope is that these requests will decrease the amount of time that you wait before being seen by our physicians.       _____________________________________________________________  Should you have questions after your visit to [facility], please contact our office at [phone] between the hours of 8:30 a.m. and 4:30 p.m.  Voicemails left after 4:30 p.m. will not be returned until the following business day.  For prescription refill requests, have your pharmacy contact our office.       Resources For Cancer Patients and their Caregivers ? American Cancer Society: Can assist with transportation, wigs, general needs, runs Look Good Feel Better.        (662)675-8957 ? Cancer Care: Provides financial assistance, online support groups, medication/co-pay assistance.  1-800-813-HOPE (519)160-4770) ? [place] Assists [place] cancer patients and their families through emotional , educational and financial support.  319-848-2940 ? [place] DSS Where to apply for food stamps, Medicaid and utility assistance. 5096826103 ? [facility]: Transportation to medical appointments. 517-380-1624 ? Social Security Administration: May apply for disability if have a Stage IV cancer. 714 212 [phone] ? [employer], Disability and Transit Services: Assists with nutrition, care and transit needs. [place] Support Programs: @10RELATIVEDAYS @ > Cancer Support Group  2nd Tuesday of the month 1pm-2pm, Journey Room  > Creative Journey  3rd Tuesday of the month 1130am-1pm, Journey Room  > Look Good Feel Better  1st Wednesday of the month 10am-12 noon, Journey Room (Call [place] to register [phone])

## 2017-12-16 ENCOUNTER — Encounter (INDEPENDENT_AMBULATORY_CARE_PROVIDER_SITE_OTHER): Payer: Self-pay

## 2017-12-18 ENCOUNTER — Encounter (HOSPITAL_COMMUNITY): Payer: Self-pay | Admitting: Adult Health

## 2017-12-18 NOTE — Progress Notes (Signed)
[facility], [place] [address]   CLINIC:  Medical Oncology/Hematology  PCP:  Glenda Chroman, MD [address] [place] [address] [phone]   REASON FOR VISIT:  Follow-up for Biochemically recurrent prostate cancer   CURRENT THERAPY: Lupron inj (by urology); Additional work-up    HISTORY OF PRESENT ILLNESS:  (From Dr. Clydene Laming note on 11/17/17)     INTERVAL HISTORY:  [name] 84 y.o. male returns for routine follow-up for prostate cancer.   Recently underwent PET scan and would like to review those results together today.   Overall, he tells me that he has been feeling "pretty good."  Appetite 100%; energy levels 25%.He feels tired and weak at times. He has occasional leg swelling and constipation. He "always has bladder problems."  Endorses continued incomplete bladder emptying and nocturia 10-20 times/night.  However, his urinary flow and nocturia has improved dramatically in the past 1 week. "I don't think the contrast that I did for the scan magically helped my bladder, but it has been really good since that time."    Denies any focal bone pain. He has some numbness to his hands for the past 4-6 months, which is attributes to his carpal tunnel.   Otherwise, he is largely without other complaints today.     REVIEW OF SYSTEMS:  Review of Systems  Constitutional: Positive for fatigue. Negative for chills and fever.  HENT:  Negative.   Eyes: Negative.   Respiratory: Negative.  Negative for cough and shortness of breath.   Cardiovascular: Positive for leg swelling.  Gastrointestinal: Positive for constipation.  Endocrine: Negative.   Genitourinary: Positive for nocturia. Negative for dysuria.   Musculoskeletal: Positive for arthralgias.  Skin: Negative.  Negative for rash.  Neurological: Positive for numbness.  Hematological: Negative.   Psychiatric/Behavioral: Negative.      PAST MEDICAL/SURGICAL HISTORY:  Past Medical  History:  Diagnosis Date  . Arthritis   . Asthma    Childhood  . Chronic back pain   . Coronary atherosclerosis of native coronary artery    a. CABG 2004 with LIMA-LAD, SVG-D1, SVG-OM, SVG-PDA. b. Cath ~2010 with occ of SVG-diagonal, distal LAD and OM filiing by collaterals. c. NSTEMI 12/2014 s/p DES to SVG-RCA. d. 11/2015: DES to distal graft of the SVG-distal RCA  . Essential hypertension   . Foley catheter in place   . GERD (gastroesophageal reflux disease)   . Hard of hearing   . Hyperglycemia   . Ischemic cardiomyopathy    a. EF 45% by cath 12/2014.  . Mixed hyperlipidemia   . NSTEMI (non-ST elevated myocardial infarction) ([place]) 01/01/15  . Pneumonia    hx of   . Prostate cancer [facility])    Radiation therapy 1996  . Skin cancer   . Stroke [facility])    TIA- 28 years ago    Past Surgical History:  Procedure Laterality Date  . CARDIAC CATHETERIZATION  01/01/2015   Procedure: CORONARY STENT INTERVENTION;  Surgeon: Peter M Martinique, MD;  [facility] C[facility] LAB;  Service: Cardiovascular;;  SVG to PDA  . CARDIAC CATHETERIZATION N/A 11/21/2015   Procedure: Left Heart Cath and Cors/Grafts Angiography;  Surgeon: Troy Sine, MD;  Location: [place] CV LAB;  Service: Cardiovascular;  Laterality: N/A;  . CARDIAC CATHETERIZATION N/A 11/21/2015   Procedure: Coronary Stent Intervention;  Surgeon: Troy Sine, MD;  Location: [place] CV LAB;  Service: Cardiovascular;  Laterality: N/A;  . CARDIAC CATHETERIZATION N/A 09/11/2016  Procedure: Left Heart Cath and Cors/Grafts Angiography;  Surgeon: Leonie Man, MD;  Location: [place] CV LAB;  Service: Cardiovascular;  Laterality: N/A;  . CARDIAC CATHETERIZATION N/A 09/11/2016   Procedure: Coronary Stent Intervention;  Surgeon: Leonie Man, MD;  Location: [place] CV LAB;  Service: Cardiovascular;  Laterality: N/A;  . CORONARY ARTERY BYPASS GRAFT  2004   LIMA to LAD, SVG to diagonal, SVG to circumflex, SVG to PDA  . GREEN LIGHT LASER  TURP (TRANSURETHRAL RESECTION OF PROSTATE N/A 06/04/2016   Procedure: GREEN LIGHT LASER TURP (TRANSURETHRAL RESECTION OF PROSTATE;  Surgeon: Irine Seal, MD;  Location: [facility] ORS;  Service: Urology;  Laterality: N/A;  . LEFT HEART CATHETERIZATION WITH CORONARY/GRAFT ANGIOGRAM N/A 01/01/2015   Procedure: LEFT HEART CATHETERIZATION WITH Beatrix Fetters;  Surgeon: Peter M Martinique, MD;  Location: [facility] CATH LAB;  Service: Cardiovascular;  Laterality: N/A;  . PERCUTANEOUS CORONARY STENT INTERVENTION (PCI-S)  11/20/2015   distal SVG  with DES       . TONSILLECTOMY       SOCIAL HISTORY:  Social History   Socioeconomic History  . Marital status: Married    Spouse name: Not on file  . Number of children: Not on file  . Years of education: Not on file  . Highest education level: Not on file  Social Needs  . Financial resource strain: Not on file  . Food insecurity - worry: Not on file  . Food insecurity - inability: Not on file  . Transportation needs - medical: Not on file  . Transportation needs - non-medical: Not on file  Occupational History  . Occupation: Retired    Comment: Office manager  Tobacco Use  . Smoking status: Former Smoker    Packs/day: 0.50    Years: 27.00    Pack years: 13.50    Types: Cigars    Start date: 07/28/1964    Last attempt to quit: 07/28/1986    Years since quitting: 31.4  . Smokeless tobacco: Former Systems developer    Types: Chew    Quit date: 08/07/2010  . Tobacco comment: never chewed up over a pack/day  Substance and Sexual Activity  . Alcohol use: No    Alcohol/week: 0.0 oz  . Drug use: No  . Sexual activity: Not on file  Other Topics Concern  . Not on file  Social History Narrative  . Not on file    FAMILY HISTORY:  Family History  Problem Relation Age of Onset  . Hypertension Father   . Coronary artery disease Father     CURRENT MEDICATIONS:  Outpatient Encounter Medications as of 12/15/2017  Medication Sig Note  . acetaminophen  (TYLENOL) 325 MG tablet Take 2 tablets (650 mg total) by mouth every 4 (four) hours as needed for headache or mild pain.   Marland Kitchen amLODipine (NORVASC) 5 MG tablet TAKE ONE TABLET BY MOUTH DAILY (MORNING)   . apixaban (ELIQUIS) 5 MG TABS tablet Take 1 tablet (5 mg total) by mouth 2 (two) times daily.   . bisacodyl (DULCOLAX) 5 MG EC tablet Take 5-10 mg by mouth daily as needed (constipation). Reported on 05/28/2016   . clopidogrel (PLAVIX) 75 MG tablet TAKE ONE TABLET BY MOUTH DAILY (,EVENING)   . doxazosin (CARDURA) 4 MG tablet Take 1 tablet (4 mg total) by mouth daily.   . isosorbide mononitrate (IMDUR) 60 MG 24 hr tablet TAKE ONE TABLET BY MOUTH IN THE MORNING   . metoprolol (LOPRESSOR) 50 MG tablet Take 1  tablet (50 mg total) by mouth 2 (two) times daily.   . nitroGLYCERIN (NITROSTAT) 0.4 MG SL tablet PLACE ONE (1) TABLET UNDER TONGUE EVERY 5 MINUTES UP TO (3) DOSES AS NEEDED FOR CHEST PAIN.   Marland Kitchen pantoprazole (PROTONIX) 40 MG tablet Take 1 tablet (40 mg total) by mouth daily.   Marland Kitchen PRESCRIPTION MEDICATION Hormone shots done at Dr. Ralene Muskrat office once every 4 months (for prostrate) - last injection mid December 2016 05/28/2016: Last shot was a couple months ago   . rosuvastatin (CRESTOR) 20 MG tablet TAKE ONE TABLET BY MOUTH DAILY (,EVENING)    No facility-administered encounter medications on file as of 12/15/2017.     ALLERGIES:  No Known Allergies   PHYSICAL EXAM:  ECOG Performance status: 1 - Symptomatic; remains largely independent   Vitals:   12/15/17 0954  BP: 134/65  Pulse: 60  Resp: 18  Temp: 98 F (36.7 C)  SpO2: 97%   Filed Weights   12/15/17 0954  Weight: 225 lb (102.1 kg)    Physical Exam  Constitutional: He is oriented to person, place, and time and well-developed, well-nourished, and in no distress.  HENT:  Head: Normocephalic.  Mouth/Throat: Oropharynx is clear and moist. No oropharyngeal exudate.  Eyes: Conjunctivae are normal. Pupils are equal, round, and reactive  to light. No scleral icterus.  Neck: Normal range of motion. Neck supple.  Cardiovascular: Normal rate and regular rhythm.  Pulmonary/Chest: Effort normal and breath sounds normal. No respiratory distress.  Abdominal: Soft. Bowel sounds are normal. There is no tenderness.  Musculoskeletal: Normal range of motion. He exhibits no edema.  Lymphadenopathy:    He has no cervical adenopathy.       Right: No supraclavicular adenopathy present.       Left: No supraclavicular adenopathy present.  Neurological: He is alert and oriented to person, place, and time. No cranial nerve deficit. Gait normal.  Skin: Skin is warm and dry. No rash noted.  Psychiatric: Mood, memory, affect and judgment normal.  Nursing note and vitals reviewed.    LABORATORY DATA:  I have reviewed the labs as listed.  CBC    Component Value Date/Time   WBC 5.9 09/12/2016 0351   RBC 4.25 09/12/2016 0351   HGB 12.3 (L) 09/12/2016 0351   HCT 38.1 (L) 09/12/2016 0351   PLT 177 09/12/2016 0351   MCV 89.6 09/12/2016 0351   MCH 28.9 09/12/2016 0351   MCHC 32.3 09/12/2016 0351   RDW 14.2 09/12/2016 0351   LYMPHSABS 1.2 12/30/2014 1754   MONOABS 0.4 12/30/2014 1754   EOSABS 0.2 12/30/2014 1754   BASOSABS 0.0 12/30/2014 1754   CMP Latest Ref Rng & Units 10/28/2017 09/12/2016 09/11/2016  Glucose 65 - 99 mg/dL - 101(H) 128(H)  BUN 6 - 20 mg/dL - 17 17  Creatinine 0.61 - 1.24 mg/dL 0.90 1.09 1.00  Sodium 135 - 145 mmol/L - 139 141  Potassium 3.5 - 5.1 mmol/L - 3.8 3.6  Chloride 101 - 111 mmol/L - 110 108  CO2 22 - 32 mmol/L - 22 22  Calcium 8.9 - 10.3 mg/dL - 8.7(L) 8.9  Total Protein 6.5 - 8.1 g/dL - - -  Total Bilirubin 0.3 - 1.2 mg/dL - - -  Alkaline Phos 38 - 126 U/L - - -  AST 15 - 41 U/L - - -  ALT 17 - 63 U/L - - -    PENDING LABS:    DIAGNOSTIC IMAGING:  *The following radiologic images and reports have  been reviewed independently and agree with below findings.  Axumin PET scan: 12/07/17 CLINICAL DATA:   Prostate carcinoma with biochemical recurrence.  EXAM: NUCLEAR MEDICINE PET SKULL BASE TO THIGH  TECHNIQUE: 9.7 mCi F-18 Fluciclovine was injected intravenously. Full-ring PET imaging was performed from the skull base to thigh after the radiotracer. CT data was obtained and used for attenuation correction and anatomic localization.  COMPARISON:  CT 10/28/2017, bone scan 10/02/2016  FINDINGS: NECK  No radiotracer activity in neck lymph nodes.  CHEST  There is intense radiotracer accumulation within small hilar lymph nodes. These lymph nodes are difficult to define on the noncontrast CT. The RIGHT infrahilar activity is most intense with SUV max equals 7.4. RIGHT lower paratracheal node is very small at 5 mm with SUV max equal 3.2. There is a discrete lymph node adjacent to the descending thoracic aorta measuring 6 mm (image 89, series 4) with SUV max equal 3.8.  ABDOMEN/PELVIS  Intense radiotracer activity within the prostate gland itself with SUV max equal 12.9. No focality with the near entirety of the gland involved. No abnormal radiotracer associated with iliac lymph nodes. No enlarged iliac lymph nodes. No periaortic lymph nodes identified.  Physiologic activity noted in the liver and pancreas. This mild activity in the adrenal glands is favored physiologic.  SKELETON  There is a focal lesion within the RIGHT iliac bone with a round sclerotic lesion (image 170, series 4) with SUV max equal 7.6. More generalized diffused marrow activity throughout the spine is favored benign.  IMPRESSION: 1. Intense radiotracer activity within the entirety of the prostate gland is most consistent with prostate cancer recurrence. 2. Intense radiotracer activity associated with small bilateral hilar lymph nodes and lymph node along the descending thoracic aorta with the largest lymph node in the RIGHT hilum. For the findings most concerning for prostate cancer  metastatic lesion (although no intervening pelvic or abdominal nodes identified. 3. Intense radiotracer activity associated with the RIGHT iliac bone sclerotic lesion consistent with skeletal metastasis. No clear evidence of additional skeletal lesions identified.   Electronically Signed   By: Suzy Bouchard M.D.   On: 12/07/2017 16:24     PATHOLOGY:     ASSESSMENT & PLAN:   Stage IV metastatic prostate cancer:  -Recent Axumin PET scan completed on 12/07/17 reviewed in detail with patient today. There is evidence of (R) iliac metastatic disease, and possible hilar lymphadenopathy.  Also noted to have intense radiotracer activity alone prostate gland, most consistent with prostate cancer recurrence.  These results were reviewed in detail with patient today. We also reviewed the actual PET images together today; he was also provided a paper copy of the radiologic report.   -Mr. Duzan and I spent quite a bit of time today discussing his current disease state and possible treatment options.  Discussed pt's case with Dr. Sherrine Maples.  She recommends continued surveillance for now with monitoring his clinical symptoms and PSA.  I shared these recommendations with patient today. Explained to him that based on the PET scan results, that his disease is likely not curable, but may remain treatable. He is not interested in pursuing any treatment that would affect his quality of life at this time (see below). If he changes his mind and would like treatment, then would consider obtaining for bone biopsy to confirm metastatic disease.  Continue observation for now.  -Continue ADT (? Lupron) with urology.  -Return to cancer center in 3 months for follow-up with labs.    Goals  of care discussion:  -Mr. Bojarski understands that his disease is likely metastatic at this time and is not likely curable, but may be treatable. Discussed that he could consider chemotherapy or local radiation therapy. However, he  is not symptomatic of the single bone lesion to the (R) hip, and he is not interested in therapy that may "make me feel bad."  We discussed that the biggest risk of not pursuing treatment would be eventual progression of disease. He voiced understanding of this risk.  He tells me, "I am 84 years old. My quality of life is more important to me that maybe extending my life more right now. I didn't think I would ever live this long and I don't want to do anything that will make me feel worse."  I offered support today with active listening and validation of his wishes. Shared with him that our role is to discuss options for treatment and support his wishes. Of course, he can change his mind at any time re: treatment. Will continue surveillance for now and he agrees.    Urinary complaints:  -Managed and followed by Dr. Jeffie Pollock with urology. His incomplete bladder emptying, urinary frequency, and nocturia have improved in the past week without changes in his medication regimen.  -Recommended continued follow-up with urology as directed.      Dispo:  -Return to cancer center in 3 months for follow-up with labs.    All questions were answered to patient's stated satisfaction. Encouraged patient to call with any new concerns or questions before his next visit to the cancer center and we can certain see him sooner, if needed.    Plan of care discussed with Dr. Sherrine Maples, who agrees with the above aforementioned.    A total of 35 minutes was spent in face-to-face care of this patient, with greater than 50% of that time spent in counseling and care-coordination.    Orders placed this encounter:  Orders Placed This Encounter  Procedures  . CBC with Differential/Platelet  . Comprehensive metabolic panel  . PSA      Mike Craze, NP [place] [phone]

## 2017-12-20 ENCOUNTER — Other Ambulatory Visit: Payer: Self-pay | Admitting: Cardiology

## 2017-12-29 ENCOUNTER — Telehealth (HOSPITAL_COMMUNITY): Payer: Self-pay

## 2017-12-29 NOTE — Telephone Encounter (Signed)
Patients daughter called and wants to know what the next step is in her fathers plan of care. She does not want him to wait to be seen until May, which is when he is scheduled again. Daughters name is Shauna Hugh and her number is [phone]. Elijah Santana sent a message via MyChart that we can talk with his daughter. Please advise when to reschedule his appt for and what to tell the daughter.

## 2017-12-30 NOTE — Telephone Encounter (Signed)
appt made for 3/15 at 3 pm. Patients wife and daughter notified of appt with understanding verbalized.

## 2017-12-30 NOTE — Telephone Encounter (Signed)
Let's bring him in to meet with [name] sometime in March. He can review the patient's plan of care with him again at that time.  Based on my discussion with patient at his last visit, he was not interested in pursuing any additional treatment right now and we elected to continue with surveillance (with his hormone deprivation injections that he gets through urology).  We can certainly bring him back in to discuss further with MD, particularly if he may have changed his mind re: treatment.  Please help get him scheduled for sometime in March with [name].    Mike Craze, NP [place] [phone]

## 2018-01-17 ENCOUNTER — Other Ambulatory Visit: Payer: Self-pay | Admitting: Cardiology

## 2018-01-28 ENCOUNTER — Other Ambulatory Visit: Payer: Self-pay

## 2018-01-28 ENCOUNTER — Encounter (HOSPITAL_COMMUNITY): Payer: Self-pay | Admitting: Hematology

## 2018-01-28 ENCOUNTER — Inpatient Hospital Stay (HOSPITAL_COMMUNITY): Payer: Medicare Other | Attending: Hematology | Admitting: Hematology

## 2018-01-28 VITALS — BP 116/56 | HR 66 | Temp 97.8°F | Resp 18 | Wt 227.0 lb

## 2018-01-28 DIAGNOSIS — C61 Malignant neoplasm of prostate: Secondary | ICD-10-CM | POA: Diagnosis not present

## 2018-01-28 DIAGNOSIS — C7951 Secondary malignant neoplasm of bone: Secondary | ICD-10-CM | POA: Insufficient documentation

## 2018-01-28 DIAGNOSIS — R350 Frequency of micturition: Secondary | ICD-10-CM | POA: Diagnosis not present

## 2018-01-28 DIAGNOSIS — M7989 Other specified soft tissue disorders: Secondary | ICD-10-CM | POA: Diagnosis not present

## 2018-01-28 DIAGNOSIS — C771 Secondary and unspecified malignant neoplasm of intrathoracic lymph nodes: Secondary | ICD-10-CM | POA: Diagnosis not present

## 2018-01-28 MED ORDER — ENZALUTAMIDE 40 MG PO CAPS: 80.0000 mg | ORAL_CAPSULE | Freq: Every day | ORAL | 0 refills | Status: DC

## 2018-01-28 NOTE — Assessment & Plan Note (Signed)
84 y.o. 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  Subsequently, patient has developed suspected biochemical recurrence and has been started on androgen deprivation therapy.  Recently, PSA has been rising in the context of obstructive urinary symptoms.  Restaging imaging with bone scan and CT of the abdomen pelvis did not demonstrate any convincing evidence of metastatic disease at this point.  PSA in November 2018 at [place] office was 19.9.  Based on available PSA data, [facility] nomogram demonstrates PSA doubling time of 24.2 months   Plan: Metastatic prostate cancer to the lymph nodes and bone: We discussed the findings on the fluciclovine PET/CT scan which showed lymphadenopathy in the chest and sclerotic lesion in the right ilium.  He also has diffuse enhancement in the prostate.  He continues to have increased frequency of urination.  His last PSA in November was 20.  I have talked to him about starting him on enzalutamide for better control of his prostate cancer.  He is already on Lupron every 37-month injections at his urologist's office.  Because of his advanced age, I have recommended starting at 80 mg daily and slowly increase it to 160 mg.  We talked about the side effects including but not limited to tiredness and red chance of seizures.  He already has lower extremity swelling.  Hence I did not want to start him on Zytiga and prednisone.  He is agreeable to this plan.  I have also talked to him about starting on bone protecting agents like denosumab to decrease skeletal related events.  However I would like to wait until next visit.

## 2018-01-28 NOTE — Progress Notes (Signed)
Elijah Santana, [place] [address]   CLINIC:  Medical Oncology/Hematology  PCP:  Glenda Chroman, MD [place] [address] [phone]   REASON FOR VISIT:  Follow-up for static prostate cancer.  CURRENT THERAPY: Lupron injection every 4 months.  BRIEF ONCOLOGIC HISTORY:   No history exists.     CANCER STAGING: Cancer Staging No matching staging information was found for the patient.   INTERVAL HISTORY:  [name] 84 y.o. male returns for up of his metastatic prostate cancer.  He receives Lupron injection at his urologist office every 4 months.  He complains of feeling slightly more tired than usual today.  He also has urinary frequency and urgency.  He reports slight pain in the right hip region for the last few weeks.  He denies any fevers or infections.  He underwent fluciclovine PET scan recently.      REVIEW OF SYSTEMS:  Review of Systems  Constitutional: Positive for fatigue. Negative for appetite change and fever.  HENT:  Negative.   Respiratory: Negative.   Cardiovascular: Positive for leg swelling (right).  Genitourinary: Positive for frequency.   Musculoskeletal: Negative.   Skin: Negative.   Neurological: Positive for numbness (carpal tunnel).  Hematological: Negative.   Psychiatric/Behavioral: Negative.      PAST MEDICAL/SURGICAL HISTORY:  Past Medical History:  Diagnosis Date  . Arthritis   . Asthma    Childhood  . Chronic back pain   . Coronary atherosclerosis of native coronary artery    a. CABG 2004 with LIMA-LAD, SVG-D1, SVG-OM, SVG-PDA. b. Cath ~2010 with occ of SVG-diagonal, distal LAD and OM filiing by collaterals. c. NSTEMI 12/2014 s/p DES to SVG-RCA. d. 11/2015: DES to distal graft of the SVG-distal RCA  . Essential hypertension   . Foley catheter in place   . GERD (gastroesophageal reflux disease)   . Hard of hearing   . Hyperglycemia   . Ischemic cardiomyopathy    a. EF 45% by cath 12/2014.  .  Mixed hyperlipidemia   . NSTEMI (non-ST elevated myocardial infarction) ([place]) 01/01/15  . Pneumonia    hx of   . Prostate cancer [facility])    Radiation therapy 1996  . Skin cancer   . Stroke [facility])    TIA- 28 years ago    Past Surgical History:  Procedure Laterality Date  . CARDIAC CATHETERIZATION  01/01/2015   Procedure: CORONARY STENT INTERVENTION;  Surgeon: Peter M Martinique, MD;  Location: [facility] CATH LAB;  Service: Cardiovascular;;  SVG to PDA  . CARDIAC CATHETERIZATION N/A 11/21/2015   Procedure: Left Heart Cath and Cors/Grafts Angiography;  Surgeon: Troy Sine, MD;  Location: [facility] CV LAB;  Service: Cardiovascular;  Laterality: N/A;  . CARDIAC CATHETERIZATION N/A 11/21/2015   Procedure: Coronary Stent Intervention;  Surgeon: Troy Sine, MD;  Location: [place] CV LAB;  Service: Cardiovascular;  Laterality: N/A;  . CARDIAC CATHETERIZATION N/A 09/11/2016   Procedure: Left Heart Cath and Cors/Grafts Angiography;  Surgeon: Leonie Man, MD;  Location: [facility] CV LAB;  Service: Cardiovascular;  Laterality: N/A;  . CARDIAC CATHETERIZATION N/A 09/11/2016   Procedure: Coronary Stent Intervention;  Surgeon: Leonie Man, MD;  Location: [place] CV LAB;  Service: Cardiovascular;  Laterality: N/A;  . CORONARY ARTERY BYPASS GRAFT  2004   LIMA to LAD, SVG to diagonal, SVG to circumflex, SVG to PDA  . GREEN LIGHT LASER TURP (TRANSURETHRAL RESECTION OF PROSTATE N/A 06/04/2016  Procedure: GREEN LIGHT LASER TURP (TRANSURETHRAL RESECTION OF PROSTATE;  Surgeon: Irine Seal, MD;  Location: [facility] ORS;  Service: Urology;  Laterality: N/A;  . LEFT HEART CATHETERIZATION WITH CORONARY/GRAFT ANGIOGRAM N/A 01/01/2015   Procedure: LEFT HEART CATHETERIZATION WITH Beatrix Fetters;  Surgeon: Peter M Martinique, MD;  Location: [facility] CATH LAB;  Service: Cardiovascular;  Laterality: N/A;  . PERCUTANEOUS CORONARY STENT INTERVENTION (PCI-S)  11/20/2015   distal SVG  with DES       . TONSILLECTOMY        SOCIAL HISTORY:  Social History   Socioeconomic History  . Marital status: Married    Spouse name: Not on file  . Number of children: Not on file  . Years of education: Not on file  . Highest education level: Not on file  Social Needs  . Financial resource strain: Not on file  . Food insecurity - worry: Not on file  . Food insecurity - inability: Not on file  . Transportation needs - medical: Not on file  . Transportation needs - non-medical: Not on file  Occupational History  . Occupation: Retired    Comment: Office manager  Tobacco Use  . Smoking status: Former Smoker    Packs/day: 0.50    Years: 27.00    Pack years: 13.50    Types: Cigars    Start date: 07/28/1964    Last attempt to quit: 07/28/1986    Years since quitting: 31.5  . Smokeless tobacco: Former Systems developer    Types: Chew    Quit date: 08/07/2010  . Tobacco comment: never chewed up over a pack/day  Substance and Sexual Activity  . Alcohol use: No    Alcohol/week: 0.0 oz  . Drug use: No  . Sexual activity: Not on file  Other Topics Concern  . Not on file  Social History Narrative  . Not on file    FAMILY HISTORY:  Family History  Problem Relation Age of Onset  . Hypertension Father   . Coronary artery disease Father     CURRENT MEDICATIONS:  Outpatient Encounter Medications as of 01/28/2018  Medication Sig  . acetaminophen (TYLENOL) 325 MG tablet Take 2 tablets (650 mg total) by mouth every 4 (four) hours as needed for headache or mild pain.  Marland Kitchen amLODipine (NORVASC) 5 MG tablet TAKE ONE TABLET BY MOUTH DAILY (MORNING)  . apixaban (ELIQUIS) 5 MG TABS tablet Take 1 tablet (5 mg total) by mouth 2 (two) times daily.  . bisacodyl (DULCOLAX) 5 MG EC tablet Take 5-10 mg by mouth daily as needed (constipation). Reported on 05/28/2016  . clopidogrel (PLAVIX) 75 MG tablet TAKE ONE TABLET BY MOUTH DAILY (,EVENING)  . doxazosin (CARDURA) 4 MG tablet TAKE ONE TABLET BY MOUTH DAILY (,EVENING)  .  enzalutamide (XTANDI) 40 MG capsule Take 2 capsules (80 mg total) by mouth daily.  . isosorbide mononitrate (IMDUR) 60 MG 24 hr tablet TAKE ONE TABLET BY MOUTH IN THE MORNING  . metoprolol tartrate (LOPRESSOR) 50 MG tablet TAKE ONE TABLET BY MOUTH TWICE DAILY (MORNING ,EVENING)  . pantoprazole (PROTONIX) 40 MG tablet Take 1 tablet (40 mg total) by mouth daily.  Marland Kitchen PRESCRIPTION MEDICATION Hormone shots done at Dr. Ralene Muskrat office once every 4 months (for prostrate) - last injection mid December 2016  . rosuvastatin (CRESTOR) 20 MG tablet TAKE ONE TABLET BY MOUTH DAILY (,EVENING)  . nitroGLYCERIN (NITROSTAT) 0.4 MG SL tablet PLACE ONE (1) TABLET UNDER TONGUE EVERY 5 MINUTES UP TO (3) DOSES AS NEEDED FOR  CHEST PAIN. (Patient not taking: Reported on 01/28/2018)   No facility-administered encounter medications on file as of 01/28/2018.     ALLERGIES:  No Known Allergies   PHYSICAL EXAM:  ECOG Performance status: 1  Vitals:   01/28/18 1608  BP: (!) 116/56  Pulse: 66  Resp: 18  Temp: 97.8 F (36.6 C)  SpO2: 95%   Filed Weights   01/28/18 1608  Weight: 227 lb (103 kg)    Physical Exam  Constitutional: He is oriented to person, place, and time.  Cardiovascular: Normal rate and regular rhythm.  Pulmonary/Chest: Effort normal and breath sounds normal.  Abdominal: Soft. Bowel sounds are normal.  Musculoskeletal: He exhibits edema.  Neurological: He is alert and oriented to person, place, and time.  Psychiatric: Mood, memory, affect and judgment normal.     LABORATORY DATA:  I have reviewed the labs as listed.  CBC    Component Value Date/Time   WBC 5.9 09/12/2016 0351   RBC 4.25 09/12/2016 0351   HGB 12.3 (L) 09/12/2016 0351   HCT 38.1 (L) 09/12/2016 0351   PLT 177 09/12/2016 0351   MCV 89.6 09/12/2016 0351   MCH 28.9 09/12/2016 0351   MCHC 32.3 09/12/2016 0351   RDW 14.2 09/12/2016 0351   LYMPHSABS 1.2 12/30/2014 1754   MONOABS 0.4 12/30/2014 1754   EOSABS 0.2 12/30/2014  1754   BASOSABS 0.0 12/30/2014 1754   CMP Latest Ref Rng & Units 10/28/2017 09/12/2016 09/11/2016  Glucose 65 - 99 mg/dL - 101(H) 128(H)  BUN 6 - 20 mg/dL - 17 17  Creatinine 0.61 - 1.24 mg/dL 0.90 1.09 1.00  Sodium 135 - 145 mmol/L - 139 141  Potassium 3.5 - 5.1 mmol/L - 3.8 3.6  Chloride 101 - 111 mmol/L - 110 108  CO2 22 - 32 mmol/L - 22 22  Calcium 8.9 - 10.3 mg/dL - 8.7(L) 8.9  Total Protein 6.5 - 8.1 g/dL - - -  Total Bilirubin 0.3 - 1.2 mg/dL - - -  Alkaline Phos 38 - 126 U/L - - -  AST 15 - 41 U/L - - -  ALT 17 - 63 U/L - - -      DIAGNOSTIC IMAGING:  *The following radiologic images and reports have been reviewed independently and agree with below findings.     ASSESSMENT & PLAN:  Prostate cancer ([place]) 84 y.o. 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  Subsequently, patient has developed suspected biochemical recurrence and has been started on androgen deprivation therapy.  Recently, PSA has been rising in the context of obstructive urinary symptoms.  Restaging imaging with bone scan and CT of the abdomen pelvis did not demonstrate any convincing evidence of metastatic disease at this point.  PSA in November 2018 at [place] office was 19.9.  Based on available PSA data, [facility] nomogram demonstrates PSA doubling time of 24.2 months   Plan: Metastatic prostate cancer to the lymph nodes and bone: We discussed the findings on the fluciclovine PET/CT scan which showed lymphadenopathy in the chest and sclerotic lesion in the right ilium.  He also has diffuse enhancement in the prostate.  He continues to have increased frequency of urination.  His last PSA in November was 20.  I have talked to him about starting him on enzalutamide for better control of his prostate cancer.  He is already on Lupron every 50-month injections at his urologist's office.  Because of his advanced age, I have recommended starting at  80 mg daily and slowly increase it to  160 mg.  We talked about the side effects including but not limited to tiredness and red chance of seizures.  He already has lower extremity swelling.  Hence I did not want to start him on Zytiga and prednisone.  He is agreeable to this plan.  I have also talked to him about starting on bone protecting agents like denosumab to decrease skeletal related events.  However I would like to wait until next visit.    Derek Jack MD [place] [phone]

## 2018-02-01 ENCOUNTER — Encounter (HOSPITAL_COMMUNITY): Payer: Self-pay | Admitting: Emergency Medicine

## 2018-02-01 NOTE — Progress Notes (Signed)
Chemotherapy teaching on [place] pulled together.

## 2018-02-01 NOTE — Patient Instructions (Signed)
[place]   CHEMOTHERAPY INSTRUCTIONS  You have metastatic prostate cancer.  You are going to start taking Xtandi for your prostate cancer.  You will take 2 capsules (80 mg) daily.  You can take this medication with or without food.  Try to take it at the same time every day.  This treatment is with palliative intent, which means you are treatable but not curable.   You will see the doctor regularly throughout treatment.  We monitor your lab work prior to every treatment.  The doctor monitors your response to treatment by the way you are feeling, your blood work, and scans periodically.   POTENTIAL SIDE EFFECTS OF TREATMENT:  Enzalutamide Gillermina Phy)  About This Drug Enzalutamide is used to treat cancer. It is given orally (by mouth).  Possible Side Effects . Headache . Hot flashes or sudden skin flushing may happen. You may also feel warm or red. . Tiredness and weakness . Feeling dizzy . Trouble breathing . Upper respiratory infection . Decreased appetite (decreased hunger) . Weight loss . Loose bowel movements (diarrhea) . Constipation (unable to move bowels) . Back pain . Joints, bone and muscle pain . Swelling in the legs, ankles and/or feet . High blood pressure . Decrease in the number of white blood cells. This may raise your risk of infection. . Changes in your liver function Note: Each of the side effects above was reported in 10% or greater of patients treated with enzalutamide. Not all possible side effects are included above.  Warnings and Precautions . Seizure. Common symptoms of a seizure can include confusion, blacking out, passing out, loss of hearing or vision, blurred vision, unusual smells or tastes (such as burning rubber), trouble talking, tremors or shaking in parts or all of the body, repeated body movements, tense muscles that do not relax, and loss of control of urine and bowels. There are other less common symptoms of  seizures. If you or your family member suspects you are having a seizure, call 911 right away. . Changes in your central nervous system can happen. The central nervous system is made up of your brain and spinal cord. You could feel extreme tiredness, agitation, confusion, hallucinations (see or hear things that are not there), have trouble understanding or speaking, loss of control of your bowels or bladder, eyesight changes, numbness or lack of strength to your arms, legs, face, or body, seizures or coma. If you start to have any of these symptoms let your doctor know right away.     Important Information . This drug may impair your ability to drive or use machinery. This drug may increase the risk of falling. Use caution and tell your nurse or doctor if you feel dizzy.  How to Take Your Medication . Swallow the medicine whole with or without food. Do not chew, open, or dissolve it. . Take this medicine at the same time each day. . Missed dose: If you miss a dose, take it as soon as you think about it on the same day. If you forget to take your dose for the whole day, take your regular dose the next day at the regular time and contact your physician. Do not take 2 doses at the same time and do not double up on the next dose. . If you vomit a dose, take your next dose at the regular time, and contact your physician. . Handling: Wash your hands after handling your medicine, your caretakers should not  handle your medicine with bare hands and should wear latex gloves. . This drug may be present in the saliva, tears, sweat, urine, stool, vomit, semen, and vaginal secretions. Talk to your doctor and/or your nurse about the necessary precautions to take during this time. . Storage: Store this medicine in the original container at room temperature. Discuss with your nurse or your doctor how to dispose of unused medicine.  Treating Side Effects . If you are dizzy, get up slowly after sitting or  lying. Marland Kitchen Keeping your pain under control is important to your well-being. Please tell your doctor or nurse if you are experiencing pain. . Get regular exercise. If you feel too tired to exercise vigorously, try taking a short walk. . To help with decreased appetite, eat small, frequent meals. . Eat high caloric food such as pudding, ice cream, yogurt and milkshakes. . To help with weight loss, drink fluids that contribute calories (whole milk, juice, soft drinks, sweetened beverages, milkshakes, and nutritional supplements) instead of water. . Include a source of protein at every meal and snack, such as meat, poultry, fish, dry beans, tofu, eggs, nuts, milk, yogurt, cheese, ice cream, pudding, and nutritional supplements. . Drink plenty of fluids (a minimum of eight glasses per day is recommended). . If you throw up or have loose bowel movements, you should drink more fluids so that you do not become dehydrated (lack water in the body from losing too much fluid). . If you get diarrhea, eat low-fiber foods that are high in protein and calories and avoid foods that can irritate your digestive tracts or lead to cramping. . Ask your nurse or doctor about medicine that can lessen or stop your diarrhea and/or constipation . If you are not able to move your bowels, check with your doctor or nurse before you use enemas, laxatives, or suppositories. . To decrease infection, wash your hands regularly. . Avoid close contact with people who have a cold, the flu, or other infections. . Take your temperature as your doctor or nurse tells you, and whenever you feel like you may have a fever.  Food and Drug Interactions . There are no known interactions of enzalutamide with food. . Check with your doctor or pharmacist about all other prescription medicines and over-the-counter medicines and dietary supplements (vitamins, minerals, herbs and others) you are taking before starting this medicine as there are known  drug interactions with enzalutamide. Also, check with your doctor or pharmacist before starting any new prescription or over-the-counter medicines, or dietary supplement to make sure that there are no interactions. . Avoid the use of St. John's Wort while taking enzalutamide as this may lower the levels of the drug in your body, which can make it less effective. . There are known interactions of enzalutamide with blood thinning medicine such as warfarin. Ask your doctor what precautions you should take.  When to Call the Doctor Call your doctor or nurse if you have any of these symptoms and/or any new or unusual symptoms: . Fever of 100.5 F (38 C) or higher . Chills . Confusion and/or agitation . Feeling dizzy or lightheaded . Hallucinations . Trouble understanding or speaking . Blurry vision or changes in your eyesight . Numbness or lack of strength to your arms, legs, face, or body . Wheezing or trouble breathing . You cough up yellow, green, or bloody mucus . Extreme weakness that interferes with normal activities . Pain that does not go away, or is not relieved by prescribed  medicines . Lasting loss of appetite or rapid weight loss of five pounds in a week . Fatigue that interferes with your daily activities . Loose bowel movements (diarrhea) 4 times a day or loose bowel movements with lack of strength or a feeling of being dizzy . No bowel movement in 3 days or when you feel uncomfortable . Signs of possible liver problems: dark urine, pale bowel movements, bad stomach pain, feeling very tired and weak, unusual itching, or yellowing of the eyes or skin . Symptoms of a seizure such as confusion, blacking out, passing out, loss of hearing or vision, blurred vision, unusual smells or tastes (such as burning rubber), trouble talking, tremors or shaking in parts or all of the body, repeated body movements, tense muscles that do not relax, and loss of control of urine and bowels.  If you or  your family member suspects you are having a seizure, call 911 right away. . If you think you may have impregnated your partner  Reproduction Warnings . Pregnancy warning: This drug can have harmful effects on the unborn baby. Enzalutamide is not indicated for use in women. Men with male partners of childbearing potential or with partners who are pregnant should use a condom during your cancer treatment and for 3 months after your cancer treatment. Let your doctor know right away if you think you may have impregnated your partner. . Breastfeeding warning: It is not known if this drug passes into breast milk. Enzalutamide is not indicated for use in women. . Fertility warning: In men, this drug may affect your ability to have children in the future. Talk with your doctor or nurse if you plan to have children. Ask for information on sperm banking.    SELF CARE ACTIVITIES WHILE ON CHEMOTHERAPY: Hydration Increase your fluid intake 48 hours prior to treatment and drink at least 8 to 12 cups (64 ounces) of water/decaff beverages per day after treatment. You can still have your cup of coffee or soda but these beverages do not count as part of your 8 to 12 cups that you need to drink daily. No alcohol intake.   Medications Continue taking your normal prescription medication as prescribed.  If you start any new herbal or new supplements, please let us know first to make sure it is safe.  Mouth Care Have teeth cleaned professionally before starting treatment. Keep dentures and partial plates clean. Use soft toothbrush and do not use mouthwashes that contain alcohol. Biotene is a good mouthwash that is available at most pharmacies or may be ordered by calling [phone]. Use warm salt water gargles (1 teaspoon salt per 1 quart warm water) before and after meals and at bedtime. Or you may rinse with 2 tablespoons of three-percent hydrogen peroxide mixed in eight ounces of water. If you are still having  problems with your mouth or sores in your mouth please call the clinic. If you need dental work, please let the doctor know before you go for your appointment so that we can coordinate the best possible time for you in regards to your chemo regimen. You need to also let your dentist know that you are actively taking chemo. We may need to do labs prior to your dental appointment.  Skin Care Always use sunscreen that has not expired and with SPF (Sun Protection Factor) of 50 or higher. Wear hats to protect your head from the sun. Remember to use sunscreen on your hands, ears, face, & feet.  Use good  moisturizing lotions such as udder cream, eucerin, or even Vaseline. Some chemotherapies can cause dry skin, color changes in your skin and nails.    . Avoid long, hot showers or baths. . Use gentle, fragrance-free soaps and laundry detergent. . Use moisturizers, preferably creams or ointments rather than lotions because the thicker consistency is better at preventing skin dehydration. Apply the cream or ointment within 15 minutes of showering. Reapply moisturizer at night, and moisturize your hands every time after you wash them.  Hair Loss (if your doctor says your hair will fall out)  . If your doctor says that your hair is likely to fall out, decide before you begin chemo whether you want to wear a wig. You may want to shop before treatment to match your hair color. . Hats, turbans, and scarves can also camouflage hair loss, although some people prefer to leave their heads uncovered. If you go bare-headed outdoors, be sure to use sunscreen on your scalp. . Cut your hair short. It eases the inconvenience of shedding lots of hair, but it also can reduce the emotional impact of watching your hair fall out. . Don't perm or color your hair during chemotherapy. Those chemical treatments are already damaging to hair and can enhance hair loss. Once your chemo treatments are done and your hair has grown back,  it's OK to resume dyeing or perming hair. With chemotherapy, hair loss is almost always temporary. But when it grows back, it may be a different color or texture. In older adults who still had hair color before chemotherapy, the new growth may be completely gray.  Often, new hair is very fine and soft.  Infection Prevention Please wash your hands for at least 30 seconds using warm soapy water. Handwashing is the #1 way to prevent the spread of germs. Stay away from sick people or people who are getting over a cold. If you develop respiratory systems such as green/yellow mucus production or productive cough or persistent cough let us know and we will see if you need an antibiotic. It is a good idea to keep a pair of gloves on when going into grocery stores/[employer] to decrease your risk of coming into contact with germs on the carts, etc. Carry alcohol hand gel with you at all times and use it frequently if out in public. If your temperature reaches 100.5 or higher please call the clinic and let us know.  If it is after hours or on the weekend please go to the ER if your temperature is over 100.5.  Please have your own personal thermometer at home to use.    Sex and bodily fluids If you are going to have sex, a condom must be used to protect the person that isn't taking chemotherapy. Chemo can decrease your libido (sex drive). For a few days after chemotherapy, chemotherapy can be excreted through your bodily fluids.  When using the toilet please close the lid and flush the toilet twice.  Do this for a few day after you have had chemotherapy.   Effects of chemotherapy on your sex life Some changes are simple and won't last long. They won't affect your sex life permanently. Sometimes you may feel: . too tired . not strong enough to be very active . sick or sore  . not in the mood . anxious or low Your anxiety might not seem related to sex. For example, you may be worried about the cancer and how your  treatment is going. Or  you may be worried about money, or about how you family are coping with your illness. These things can cause stress, which can affect your interest in sex. It's important to talk to your partner about how you feel. Remember - the changes to your sex life don't usually last long. There's usually no medical reason to stop having sex during chemo. The drugs won't have any long term physical effects on your performance or enjoyment of sex. Cancer can't be passed on to your partner during sex Contraception It's important to use reliable contraception during treatment. Avoid getting pregnant while you or your partner are having chemotherapy. This is because the drugs may harm the baby. Sometimes chemotherapy drugs can leave a man or woman infertile.  This means you would not be able to have children in the future. You might want to talk to someone about permanent infertility. It can be very difficult to learn that you may no longer be able to have children. Some people find counselling helpful. There might be ways to preserve your fertility, although this is easier for men than for women. You may want to speak to a fertility expert. You can talk about sperm banking or harvesting your eggs. You can also ask about other fertility options, such as donor eggs. If you have or have had breast cancer, your doctor might advise you not to take the contraceptive pill. This is because the hormones in it might affect the cancer.  It is not known for sure whether or not chemotherapy drugs can be passed on through semen or secretions from the vagina. Because of this some doctors advise people to use a barrier method if you have sex during treatment. This applies to vaginal, anal or oral sex. Generally, doctors advise a barrier method only for the time you are actually having the treatment and for about a week after your treatment. Advice like this can be worrying, but this does not mean that you have to  avoid being intimate with your partner. You can still have close contact with your partner and continue to enjoy sex.  Animals If you have cats or birds we just ask that you not change the litter or change the cage.  Please have someone else do this for you while you are on chemotherapy.    Food Safety During and After Cancer Treatment Food safety is important for people both during and after cancer treatment. Cancer and cancer treatments, such as chemotherapy, radiation therapy, and stem cell/bone marrow transplantation, often weaken the immune system. This makes it harder for your body to protect itself from foodborne illness, also called food poisoning. Foodborne illness is caused by eating food that contains harmful bacteria, parasites, or viruses.  Foods to avoid Some foods have a higher risk of becoming tainted with bacteria. These include: Marland Kitchen Unwashed fresh fruit and vegetables, especially leafy vegetables that can hide dirt and other contaminants . Raw sprouts, such as alfalfa sprouts . Raw or undercooked beef, especially ground beef, or other raw or undercooked meat and poultry . Fatty, fried, or spicy foods immediately before or after treatment.  These can sit heavy on your stomach and make you feel nauseous. . Raw or undercooked shellfish, such as oysters. . Sushi and sashimi, which often contain raw fish.  . Unpasteurized beverages, such as unpasteurized fruit juices, raw milk, raw yogurt, or cider . Undercooked eggs, such as soft boiled, over easy, and poached; raw, unpasteurized eggs; or foods made with raw egg, such  as homemade raw cookie dough and homemade mayonnaise Simple steps for food safety Shop smart. . Do not buy food stored or displayed in an unclean area. . Do not buy bruised or damaged fruits or vegetables. . Do not buy cans that have cracks, dents, or bulges. . Pick up foods that can spoil at the end of your shopping trip and store them in a cooler on the way  home. Prepare and clean up foods carefully. . Rinse all fresh fruits and vegetables under running water, and dry them with a clean towel or paper towel. . Clean the top of cans before opening them. . After preparing food, wash your hands for 20 seconds with hot water and soap. Pay special attention to areas between fingers and under nails. . Clean your utensils and dishes with hot water and soap. Marland Kitchen Disinfect your kitchen and cutting boards using 1 teaspoon of liquid, unscented bleach mixed into 1 quart of water.   Dispose of old food. . Eat canned and packaged food before its expiration date (the "use by" or "best before" date). . Consume refrigerated leftovers within 3 to 4 days. After that time, throw out the food. Even if the food does not smell or look spoiled, it still may be unsafe. Some bacteria, such as Listeria, can grow even on foods stored in the refrigerator if they are kept for too long. Take precautions when eating out. . At restaurants, avoid buffets and salad bars where food sits out for a long time and comes in contact with many people. Food can become contaminated when someone with a virus, often a norovirus, or another "bug" handles it. . Put any leftover food in a "to-go" container yourself, rather than having the server do it. And, refrigerate leftovers as soon as you get home. . Choose restaurants that are clean and that are willing to prepare your food as you order it cooked.        MEDICATIONS:  Over-the-Counter Meds: Miralax 1 capful in 8 oz of fluid daily. May increase to two times a day if needed. This is a stool softener. If this doesn't work proceed you can add:  Senokot S-start with 1 tablet two times a day and increase to 4 tablets two times a day if needed. (total of 8 tablets in a 24 hour period). This is a stimulant laxative.   Call us if this does not help your bowels move.   Imodium 2mg  capsule. Take 2 capsules after the 1st loose stool and then 1  capsule every 2 hours until you go a total of 12 hours without having a loose stool. Call the [facility] if loose stools continue. If diarrhea occurs @ bedtime, take 2 capsules @ bedtime. Then take 2 capsules every 4 hours until morning. Call [facility].   Constipation Sheet *Miralax in 8 oz of fluid daily.  May increase to two times a day if needed.  This is a stool softener.  If this not enough to keep your bowel regular:  You can add:  *Senokot S, start with one tablet twice a day and can increase to 4 tablets twice a day if needed.  This is a stimulant laxative.   Sometimes when you take pain medication you need BOTH a medicine to keep your stool soft and a medicine to help your bowel push it out! Please call if the above does not work for you.   Do not go more than 2 days without a bowel  movement.  It is very important that you do not become constipated.  It will make you feel sick to your stomach (nausea) and can cause abdominal pain and vomiting.    Diarrhea Sheet  If you are having loose stools/diarrhea, please purchase Imodium and begin taking as outlined:  At the first sign of poorly formed or loose stools you should begin taking Imodium(loperamide) 2 mg capsules.  Take two caplets (4mg ) followed by one caplet (2mg ) every 2 hours until you have had no diarrhea for 12 hours.  During the night take two caplets (4mg ) at bedtime and continue every 4 hours during the night until the morning.  Stop taking Imodium only after there is no sign of diarrhea for 12 hours.    Always call the [facility] if you are having loose stools/diarrhea that you can't get under control.  Loose stools/diarrhea leads to dehydration (loss of water) in your body.  We have other options of trying to get the loose stools/diarrhea to stopped but you must let us know!    SYMPTOMS TO REPORT AS SOON AS POSSIBLE AFTER TREATMENT:  FEVER GREATER THAN 100.5 F  CHILLS WITH OR WITHOUT FEVER  NAUSEA AND  VOMITING THAT IS NOT CONTROLLED WITH YOUR NAUSEA MEDICATION  UNUSUAL SHORTNESS OF BREATH  UNUSUAL BRUISING OR BLEEDING  TENDERNESS IN MOUTH AND THROAT WITH OR WITHOUT PRESENCE OF ULCERS  URINARY PROBLEMS  BOWEL PROBLEMS  UNUSUAL RASH    Wear comfortable clothing and clothing appropriate for easy access to any Portacath or PICC line. Let us know if there is anything that we can do to make your therapy better!   What to do if you need assistance after hours or on the weekends: CALL [phone].  HOLD on the line, do not hang up.  You will hear multiple messages but at the end you will be connected with a nurse triage line.  They will contact the doctor if necessary.  Most of the time they will be able to assist you.  Do not call the hospital operator.     I have been informed and understand all of the instructions given to me and have received a copy. I have been instructed to call the clinic [phone] or my family physician as soon as possible for continued medical care, if indicated. I do not have any more questions at this time but understand that I may call the [facility] or the Patient Navigator at [phone] during office hours should I have questions or need assistance in obtaining follow-up care.

## 2018-02-04 ENCOUNTER — Telehealth (HOSPITAL_COMMUNITY): Payer: Self-pay | Admitting: Hematology

## 2018-02-08 ENCOUNTER — Telehealth (HOSPITAL_COMMUNITY): Payer: Self-pay | Admitting: Emergency Medicine

## 2018-02-08 NOTE — Telephone Encounter (Signed)
Pt called and stated that he got his medication Gillermina Phy) in the mail.  He understands that he is to take 2 pills at the same time (80mg ) every day.  He is going to start taking this in the morning (on 3/27) after he eats breakfast.  This medication can be taken with or without food.  Make sure keep medication in the original container.  Wash hands well after handling the medication.  Went over the side effects of the medication.  He verbalized understanding.  His follow up visit is 4/2 at 1 pm for labs then OV at 1:40 pm.  I will have patient an education packet and consent to sign when he comes in for his OV.  He verbalized understanding.

## 2018-02-09 NOTE — Progress Notes (Signed)
Cardiology Office Note  Date: 02/10/2018   ID: Elijah Santana, Elijah Santana 1932/05/29, MRN [id number]  PCP: Glenda Chroman, MD  Primary Cardiologist: Rozann Lesches, MD   Chief Complaint  Patient presents with  . Coronary Artery Disease    History of Present Illness: Elijah Santana is an 84 y.o. male last seen in September 2018.  He presents for a routine follow-up visit.  Fortunately, he does not report any progressing angina on current medical therapy, no increasing nitroglycerin use.  He is following at the [facility] for management of metastatic prostate cancer.  He is due for follow-up lab work on [employer].  I reviewed his last results from November 2018.  He is due to see Dr. Marcial Pacas week.  He does not report any spontaneous bleeding episodes.  We went over his cardiac medications.  He has had no change from an antianginal perspective.  Past Medical History:  Diagnosis Date  . Arthritis   . Asthma    Childhood  . Chronic back pain   . Coronary atherosclerosis of native coronary artery    a. CABG 2004 with LIMA-LAD, SVG-D1, SVG-OM, SVG-PDA. b. Cath ~2010 with occ of SVG-diagonal, distal LAD and OM filiing by collaterals. c. NSTEMI 12/2014 s/p DES to SVG-RCA. d. 11/2015: DES to distal graft of the SVG-distal RCA  . Essential hypertension   . Foley catheter in place   . GERD (gastroesophageal reflux disease)   . Hard of hearing   . Hyperglycemia   . Ischemic cardiomyopathy    a. EF 45% by cath 12/2014.  . Mixed hyperlipidemia   . NSTEMI (non-ST elevated myocardial infarction) ([place]) 01/01/15  . Pneumonia    hx of   . Prostate cancer [facility])    Radiation therapy 1996  . Skin cancer   . Stroke [facility])    TIA- 28 years ago     Past Surgical History:  Procedure Laterality Date  . CARDIAC CATHETERIZATION  01/01/2015   Procedure: CORONARY STENT INTERVENTION;  Surgeon: Peter M Martinique, MD;  Location: [facility] CATH LAB;  Service: Cardiovascular;;  SVG to PDA  . CARDIAC  CATHETERIZATION N/A 11/21/2015   Procedure: Left Heart Cath and Cors/Grafts Angiography;  Surgeon: Troy Sine, MD;  Location: [place] CV LAB;  Service: Cardiovascular;  Laterality: N/A;  . CARDIAC CATHETERIZATION N/A 11/21/2015   Procedure: Coronary Stent Intervention;  Surgeon: Troy Sine, MD;  Location: [employer] CV LAB;  Service: Cardiovascular;  Laterality: N/A;  . CARDIAC CATHETERIZATION N/A 09/11/2016   Procedure: Left Heart Cath and Cors/Grafts Angiography;  Surgeon: Leonie Man, MD;  Location: [place] CV LAB;  Service: Cardiovascular;  Laterality: N/A;  . CARDIAC CATHETERIZATION N/A 09/11/2016   Procedure: Coronary Stent Intervention;  Surgeon: Leonie Man, MD;  Location: [place] CV LAB;  Service: Cardiovascular;  Laterality: N/A;  . CORONARY ARTERY BYPASS GRAFT  2004   LIMA to LAD, SVG to diagonal, SVG to circumflex, SVG to PDA  . GREEN LIGHT LASER TURP (TRANSURETHRAL RESECTION OF PROSTATE N/A 06/04/2016   Procedure: GREEN LIGHT LASER TURP (TRANSURETHRAL RESECTION OF PROSTATE;  Surgeon: Irine Seal, MD;  Location: [facility] ORS;  Service: Urology;  Laterality: N/A;  . LEFT HEART CATHETERIZATION WITH CORONARY/GRAFT ANGIOGRAM N/A 01/01/2015   Procedure: LEFT HEART CATHETERIZATION WITH Beatrix Fetters;  Surgeon: Peter M Martinique, MD;  Location[facility] CATH LAB;  Service: Cardiovascular;  Laterality: N/A;  . PERCUTANEOUS CORONARY STENT INTERVENTION (PCI-S)  11/20/2015   distal  SVG  with DES       . TONSILLECTOMY      Current Outpatient Medications  Medication Sig Dispense Refill  . acetaminophen (TYLENOL) 325 MG tablet Take 2 tablets (650 mg total) by mouth every 4 (four) hours as needed for headache or mild pain.    Marland Kitchen amLODipine (NORVASC) 5 MG tablet TAKE ONE TABLET BY MOUTH DAILY (MORNING) 30 tablet 3  . apixaban (ELIQUIS) 5 MG TABS tablet Take 1 tablet (5 mg total) by mouth 2 (two) times daily. 56 tablet 0  . bisacodyl (DULCOLAX) 5 MG EC tablet Take 5-10 mg by mouth  daily as needed (constipation). Reported on 05/28/2016    . clopidogrel (PLAVIX) 75 MG tablet TAKE ONE TABLET BY MOUTH DAILY (,EVENING) 30 tablet 3  . doxazosin (CARDURA) 4 MG tablet TAKE ONE TABLET BY MOUTH DAILY (,EVENING) 30 tablet 0  . enzalutamide (XTANDI) 40 MG capsule Take 2 capsules (80 mg total) by mouth daily. 60 capsule 0  . isosorbide mononitrate (IMDUR) 60 MG 24 hr tablet TAKE ONE TABLET BY MOUTH IN THE MORNING 30 tablet 3  . metoprolol tartrate (LOPRESSOR) 50 MG tablet TAKE ONE TABLET BY MOUTH TWICE DAILY (MORNING ,EVENING) 60 tablet 3  . nitroGLYCERIN (NITROSTAT) 0.4 MG SL tablet PLACE ONE (1) TABLET UNDER TONGUE EVERY 5 MINUTES UP TO (3) DOSES AS NEEDED FOR CHEST PAIN. 25 tablet 3  . pantoprazole (PROTONIX) 40 MG tablet Take 1 tablet (40 mg total) by mouth daily. 90 tablet 3  . PRESCRIPTION MEDICATION Hormone shots done at Dr. Ralene Muskrat office once every 4 months (for prostrate) - last injection mid December 2016    . rosuvastatin (CRESTOR) 20 MG tablet TAKE ONE TABLET BY MOUTH DAILY (,EVENING) 30 tablet 3   No current facility-administered medications for this visit.    Allergies:  Patient has no known allergies.   Social History: The patient  reports that he quit smoking about 31 years ago. His smoking use included cigars. He started smoking about 53 years ago. He has a 13.50 pack-year smoking history. He quit smokeless tobacco use about 7 years ago. His smokeless tobacco use included chew. He reports that he does not drink alcohol or use drugs.   ROS:  Please see the history of present illness. Otherwise, complete review of systems is positive for hearing loss.  All other systems are reviewed and negative.   Physical Exam: VS:  BP 138/72   Pulse 68   Ht 5\' 8"  (1.727 m)   Wt 229 lb (103.9 kg)   SpO2 98%   BMI 34.82 kg/m , BMI Body mass index is 34.82 kg/m.  Wt Readings from Last 3 Encounters:  02/10/18 229 lb (103.9 kg)  01/28/18 227 lb (103 kg)  12/15/17 225 lb (102.1  kg)    General: Elderly male, appears comfortable at rest. HEENT: Conjunctiva and lids normal, oropharynx clear. Neck: Supple, no elevated JVP or carotid bruits, no thyromegaly. Lungs: Clear to auscultation, nonlabored breathing at rest. Cardiac: Regular rate and rhythm, no S3 or significant systolic murmur, no pericardial rub. Abdomen: Soft, nontender, bowel sounds present. Extremities: Mild ankle edema, distal pulses 2+. Skin: Warm and dry. Musculoskeletal: No kyphosis. Neuropsychiatric: Alert and oriented x3, affect grossly appropriate.  ECG: I personally reviewed the tracing from 08/12/2017 which shows sinus rhythm with PACs.  Recent Labwork: 10/28/2017: Creatinine, Ser 0.90  November 2018: BUN 17, creatinine 0.96, potassium 4.3, AST 16, ALT 19, hemo-given 13.0, platelets 198, TSH 2.54, cholesterol 128, triglycerides 128,  HDL 47, LDL 55  Other Studies Reviewed Today:  Cardiac catheterization 09/11/2016:  SVG-OM3: Culprit Tandem lesions - Mid Graft to Dist Graft lesion, 70 %stenosed. Dist Graft to Insertion lesion, 95 %stenosed.  A STENT PROMUS PREM MR [id number] drug eluting stent was successfully placed covering both lesions. Post intervention, there is a 0% residual stenosis.   Additional Angiography:  Ost LAD lesion, 99 %stenosed. Prox LAD to Mid LAD lesion, 100 %stenosed after small D1.  LIMA-LAD and is normal in caliber. The LAD is grafted in between 2 diagonal branches. Downstream below the D3, Dist LAD lesion, 100 %stenosed - fills via collaterals from the circumflex system.  Prox Cx to Mid Cx lesion, 100 %stenosed. Ramus lesion, 99 %stenosed. - No change from previous cath  Prox RCA lesion, 100 %stenosed. Mid RCA to Dist RCA lesion, 100 %stenosed. The vessels actually more occluded than previously noted  SVG-dRCA: Prox Graft to Mid Graft stent, ~15 %stenosed, Dist Graft stent, 0 %stenosed.  SVG-Diag Origin lesion, 100 %stenosed.  The left ventricular systolic  function is low normal. The left ventricular ejection fraction is 50-55% by visual estimate - there is a hint of possible mild anterolateral hypokinesis  LV end diastolic pressure is mildly elevated.  Successful PCI of tandem lesions in the SVG-OM 3. Otherwise angiographically similar coronary arteries and grafts compared to catheterization in January 2017.  Assessment and Plan:  1.  Multivessel CAD status post CABG.  He has graft disease and multiple percutaneous interventions as noted above.  At this point he remains clinically stable without progressive angina on medical therapy.  No changes made to current regimen.  2.  Paroxysmal atrial fibrillation.  He continues on Eliquis without bleeding episodes.  Follow-up CBC and BMET through PCP office.  3.  Essential hypertension, blood pressure control is adequate today.  4.  Hyperlipidemia.  Continue Crestor.  Last LDL 55.  Current medicines were reviewed with the patient today.   Orders Placed This Encounter  Procedures  . CBC  . Basic metabolic panel    Disposition: Follow-up in 6 months.  Signed, Satira Sark, MD, [facility] 02/10/2018 12:00 PM    [place] [facility] [place], [place], [place] [address] Phone: [phone]; Fax: [phone]

## 2018-02-10 ENCOUNTER — Ambulatory Visit (INDEPENDENT_AMBULATORY_CARE_PROVIDER_SITE_OTHER): Payer: Medicare Other | Admitting: Cardiology

## 2018-02-10 ENCOUNTER — Encounter: Payer: Self-pay | Admitting: Cardiology

## 2018-02-10 VITALS — BP 138/72 | HR 68 | Ht 68.0 in | Wt 229.0 lb

## 2018-02-10 DIAGNOSIS — E782 Mixed hyperlipidemia: Secondary | ICD-10-CM

## 2018-02-10 DIAGNOSIS — I10 Essential (primary) hypertension: Secondary | ICD-10-CM

## 2018-02-10 DIAGNOSIS — I480 Paroxysmal atrial fibrillation: Secondary | ICD-10-CM

## 2018-02-10 DIAGNOSIS — I25119 Atherosclerotic heart disease of native coronary artery with unspecified angina pectoris: Secondary | ICD-10-CM

## 2018-02-10 NOTE — Patient Instructions (Signed)

## 2018-02-15 ENCOUNTER — Other Ambulatory Visit (HOSPITAL_COMMUNITY)
Admission: RE | Admit: 2018-02-15 | Discharge: 2018-02-15 | Disposition: A | Discharge status: Home / Self Care | Payer: Medicare Other | Source: Ambulatory Visit | Attending: Cardiology | Admitting: Cardiology

## 2018-02-15 ENCOUNTER — Other Ambulatory Visit: Payer: Self-pay

## 2018-02-15 ENCOUNTER — Inpatient Hospital Stay (HOSPITAL_COMMUNITY): Payer: Medicare Other

## 2018-02-15 ENCOUNTER — Inpatient Hospital Stay (HOSPITAL_COMMUNITY): Payer: Medicare Other | Admitting: Hematology

## 2018-02-15 ENCOUNTER — Inpatient Hospital Stay (HOSPITAL_COMMUNITY): Payer: Medicare Other | Attending: Internal Medicine

## 2018-02-15 ENCOUNTER — Encounter (HOSPITAL_COMMUNITY): Payer: Self-pay | Admitting: Hematology

## 2018-02-15 VITALS — BP 133/63 | HR 66 | Temp 97.8°F | Resp 16 | Ht 68.0 in | Wt 229.1 lb

## 2018-02-15 DIAGNOSIS — K5900 Constipation, unspecified: Secondary | ICD-10-CM | POA: Diagnosis not present

## 2018-02-15 DIAGNOSIS — C771 Secondary and unspecified malignant neoplasm of intrathoracic lymph nodes: Secondary | ICD-10-CM

## 2018-02-15 DIAGNOSIS — I480 Paroxysmal atrial fibrillation: Secondary | ICD-10-CM | POA: Diagnosis present

## 2018-02-15 DIAGNOSIS — C7951 Secondary malignant neoplasm of bone: Secondary | ICD-10-CM | POA: Diagnosis not present

## 2018-02-15 DIAGNOSIS — R530 Neoplastic (malignant) related fatigue: Secondary | ICD-10-CM | POA: Insufficient documentation

## 2018-02-15 DIAGNOSIS — I2510 Atherosclerotic heart disease of native coronary artery without angina pectoris: Secondary | ICD-10-CM | POA: Insufficient documentation

## 2018-02-15 DIAGNOSIS — Z85828 Personal history of other malignant neoplasm of skin: Secondary | ICD-10-CM | POA: Diagnosis not present

## 2018-02-15 DIAGNOSIS — C61 Malignant neoplasm of prostate: Secondary | ICD-10-CM

## 2018-02-15 DIAGNOSIS — Z87891 Personal history of nicotine dependence: Secondary | ICD-10-CM | POA: Insufficient documentation

## 2018-02-15 DIAGNOSIS — Z923 Personal history of irradiation: Secondary | ICD-10-CM

## 2018-02-15 DIAGNOSIS — Z79899 Other long term (current) drug therapy: Secondary | ICD-10-CM

## 2018-02-15 LAB — CBC WITH DIFFERENTIAL/PLATELET
BASOS ABS: 0 10*3/uL (ref 0.0–0.1)
Basophils Relative: 0 %
Eosinophils Absolute: 0.2 10*3/uL (ref 0.0–0.7)
Eosinophils Relative: 4 %
HEMATOCRIT: 39.4 % (ref 39.0–52.0)
Hemoglobin: 12.5 g/dL — ABNORMAL LOW (ref 13.0–17.0)
LYMPHS PCT: 28 %
Lymphs Abs: 1.3 10*3/uL (ref 0.7–4.0)
MCH: 28.3 pg (ref 26.0–34.0)
MCHC: 31.7 g/dL (ref 30.0–36.0)
MCV: 89.1 fL (ref 78.0–100.0)
Monocytes Absolute: 0.3 10*3/uL (ref 0.1–1.0)
Monocytes Relative: 6 %
Neutro Abs: 2.9 10*3/uL (ref 1.7–7.7)
Neutrophils Relative %: 62 %
Platelets: 172 10*3/uL (ref 150–400)
RBC: 4.42 MIL/uL (ref 4.22–5.81)
RDW: 14.9 % (ref 11.5–15.5)
WBC: 4.7 10*3/uL (ref 4.0–10.5)

## 2018-02-15 LAB — COMPREHENSIVE METABOLIC PANEL
ALT: 22 U/L (ref 17–63)
AST: 19 U/L (ref 15–41)
Albumin: 4.1 g/dL (ref 3.5–5.0)
Alkaline Phosphatase: 57 U/L (ref 38–126)
Anion gap: 10 (ref 5–15)
BILIRUBIN TOTAL: 0.9 mg/dL (ref 0.3–1.2)
BUN: 18 mg/dL (ref 6–20)
CO2: 24 mmol/L (ref 22–32)
Calcium: 9.3 mg/dL (ref 8.9–10.3)
Chloride: 106 mmol/L (ref 101–111)
Creatinine, Ser: 0.8 mg/dL (ref 0.61–1.24)
GFR calc Af Amer: >60 mL/min (ref >60)
Glucose, Bld: 113 mg/dL — ABNORMAL HIGH (ref 65–99)
Potassium: 4.2 mmol/L (ref 3.5–5.1)
Sodium: 140 mmol/L (ref 135–145)
TOTAL PROTEIN: 7.2 g/dL (ref 6.5–8.1)

## 2018-02-15 LAB — PSA: PROSTATIC SPECIFIC ANTIGEN: 11.57 ng/mL — AB (ref 0.00–4.00)

## 2018-02-15 NOTE — Progress Notes (Signed)
Met with patient during office visit today.  He seems to be doing well on his Xtandi.  Taking 2 pills (80 mg) daily as prescribed.  He has no complaints.  Extensive teaching packet given.  Consent signed.

## 2018-02-15 NOTE — Progress Notes (Signed)
[place], [place] [address]   CLINIC:  Medical Oncology/Hematology  PCP:  Glenda Chroman, MD [place] [address] [phone]   REASON FOR VISIT:  Follow-up for metastatic prostate cancer.  CURRENT THERAPY: Enzalutamide 80 mg started on 02/08/2018.  BRIEF ONCOLOGIC HISTORY:    Prostate cancer ([place])   11/27/2017 Initial Diagnosis    Prostate cancer [facility])        INTERVAL HISTORY:  Elijah Santana 84 y.o. male returns for routine follow-up 1 week after start of enzalutamide.  He noticed that his urinary flow has improved since he started enzalutamide a week ago.  He is taking 2 pills daily.  He denies any worsening of his baseline fatigue.  He continues to have some numbness in his hands.  His constipation is better controlled with stool softener alternating with laxative.   REVIEW OF SYSTEMS:  Review of Systems  Constitutional: Positive for fatigue.  HENT:  Negative.   Respiratory: Negative.   Cardiovascular: Negative.   Gastrointestinal: Positive for constipation.  Genitourinary: Negative.    Musculoskeletal: Negative.   Skin: Negative.   Neurological: Positive for numbness.  Psychiatric/Behavioral: Negative.      PAST MEDICAL/SURGICAL HISTORY:  Past Medical History:  Diagnosis Date  . Arthritis   . Asthma    Childhood  . Chronic back pain   . Coronary atherosclerosis of native coronary artery    a. CABG 2004 with LIMA-LAD, SVG-D1, SVG-OM, SVG-PDA. b. Cath ~2010 with occ of SVG-diagonal, distal LAD and OM filiing by collaterals. c. NSTEMI 12/2014 s/p DES to SVG-RCA. d. 11/2015: DES to distal graft of the SVG-distal RCA  . Essential hypertension   . Foley catheter in place   . GERD (gastroesophageal reflux disease)   . Hard of hearing   . Hyperglycemia   . Ischemic cardiomyopathy    a. EF 45% by cath 12/2014.  . Mixed hyperlipidemia   . NSTEMI (non-ST elevated myocardial infarction) ([place]) 01/01/15  . Pneumonia    hx  of   . Prostate cancer [facility])    Radiation therapy 1996  . Skin cancer   . Stroke [facility])    TIA- 28 years ago    Past Surgical History:  Procedure Laterality Date  . CARDIAC CATHETERIZATION  01/01/2015   Procedure: CORONARY STENT INTERVENTION;  Surgeon: Peter M Martinique, MD;  Location: [facility] CATH LAB;  Service: Cardiovascular;;  SVG to PDA  . CARDIAC CATHETERIZATION N/A 11/21/2015   Procedure: Left Heart Cath and Cors/Grafts Angiography;  Surgeon: Troy Sine, MD;  Location: [place] CV LAB;  Service: Cardiovascular;  Laterality: N/A;  . CARDIAC CATHETERIZATION N/A 11/21/2015   Procedure: Coronary Stent Intervention;  Surgeon: Troy Sine, MD;  Location: [place] CV LAB;  Service: Cardiovascular;  Laterality: N/A;  . CARDIAC CATHETERIZATION N/A 09/11/2016   Procedure: Left Heart Cath and Cors/Grafts Angiography;  Surgeon: Leonie Man, MD;  Location: [place] CV LAB;  Service: Cardiovascular;  Laterality: N/A;  . CARDIAC CATHETERIZATION N/A 09/11/2016   Procedure: Coronary Stent Intervention;  Surgeon: Leonie Man, MD;  Location: [place] CV LAB;  Service: Cardiovascular;  Laterality: N/A;  . CORONARY ARTERY BYPASS GRAFT  2004   LIMA to LAD, SVG to diagonal, SVG to circumflex, SVG to PDA  . GREEN LIGHT LASER TURP (TRANSURETHRAL RESECTION OF PROSTATE N/A 06/04/2016   Procedure: GREEN LIGHT LASER TURP (TRANSURETHRAL RESECTION OF PROSTATE;  Surgeon: Irine Seal, MD;  Location: [facility]  ORS;  Service: Urology;  Laterality: N/A;  . LEFT HEART CATHETERIZATION WITH CORONARY/GRAFT ANGIOGRAM N/A 01/01/2015   Procedure: LEFT HEART CATHETERIZATION WITH Beatrix Fetters;  Surgeon: Peter M Martinique, MD;  Location: [facility] CATH LAB;  Service: Cardiovascular;  Laterality: N/A;  . PERCUTANEOUS CORONARY STENT INTERVENTION (PCI-S)  11/20/2015   distal SVG  with DES       . TONSILLECTOMY       SOCIAL HISTORY:  Social History   Socioeconomic History  . Marital status: Married    Spouse name: Not  on file  . Number of children: Not on file  . Years of education: Not on file  . Highest education level: Not on file  Occupational History  . Occupation: Retired    Comment: Office manager  Social Needs  . Financial resource strain: Not on file  . Food insecurity:    Worry: Not on file    Inability: Not on file  . Transportation needs:    Medical: Not on file    Non-medical: Not on file  Tobacco Use  . Smoking status: Former Smoker    Packs/day: 0.50    Years: 27.00    Pack years: 13.50    Types: Cigars    Start date: 07/28/1964    Last attempt to quit: 07/28/1986    Years since quitting: 31.5  . Smokeless tobacco: Former Systems developer    Types: Chew    Quit date: 08/07/2010  . Tobacco comment: never chewed up over a pack/day  Substance and Sexual Activity  . Alcohol use: No    Alcohol/week: 0.0 oz  . Drug use: No  . Sexual activity: Not on file  Lifestyle  . Physical activity:    Days per week: Not on file    Minutes per session: Not on file  . Stress: Not on file  Relationships  . Social connections:    Talks on phone: Not on file    Gets together: Not on file    Attends religious service: Not on file    Active member of club or organization: Not on file    Attends meetings of clubs or organizations: Not on file    Relationship status: Not on file  . Intimate partner violence:    Fear of current or ex partner: Not on file    Emotionally abused: Not on file    Physically abused: Not on file    Forced sexual activity: Not on file  Other Topics Concern  . Not on file  Social History Narrative  . Not on file    FAMILY HISTORY:  Family History  Problem Relation Age of Onset  . Hypertension Father   . Coronary artery disease Father     CURRENT MEDICATIONS:  Outpatient Encounter Medications as of 02/15/2018  Medication Sig  . acetaminophen (TYLENOL) 325 MG tablet Take 2 tablets (650 mg total) by mouth every 4 (four) hours as needed for headache or mild pain.    Marland Kitchen amLODipine (NORVASC) 5 MG tablet TAKE ONE TABLET BY MOUTH DAILY (MORNING)  . apixaban (ELIQUIS) 5 MG TABS tablet Take 1 tablet (5 mg total) by mouth 2 (two) times daily.  . bisacodyl (DULCOLAX) 5 MG EC tablet Take 5-10 mg by mouth daily as needed (constipation). Reported on 05/28/2016  . clopidogrel (PLAVIX) 75 MG tablet TAKE ONE TABLET BY MOUTH DAILY (,EVENING)  . doxazosin (CARDURA) 4 MG tablet TAKE ONE TABLET BY MOUTH DAILY (,EVENING)  . enzalutamide (XTANDI) 40 MG capsule Take  2 capsules (80 mg total) by mouth daily.  . isosorbide mononitrate (IMDUR) 60 MG 24 hr tablet TAKE ONE TABLET BY MOUTH IN THE MORNING  . metoprolol tartrate (LOPRESSOR) 50 MG tablet TAKE ONE TABLET BY MOUTH TWICE DAILY (MORNING ,EVENING)  . nitroGLYCERIN (NITROSTAT) 0.4 MG SL tablet PLACE ONE (1) TABLET UNDER TONGUE EVERY 5 MINUTES UP TO (3) DOSES AS NEEDED FOR CHEST PAIN.  Marland Kitchen pantoprazole (PROTONIX) 40 MG tablet Take 1 tablet (40 mg total) by mouth daily.  Marland Kitchen PRESCRIPTION MEDICATION Hormone shots done at Dr. Ralene Muskrat office once every 4 months (for prostrate) - last injection mid December 2016  . rosuvastatin (CRESTOR) 20 MG tablet TAKE ONE TABLET BY MOUTH DAILY (,EVENING)   No facility-administered encounter medications on file as of 02/15/2018.     ALLERGIES:  No Known Allergies   PHYSICAL EXAM:  ECOG Performance status: 1  Vitals:   02/15/18 1453  BP: 133/63  Pulse: 66  Resp: 16  Temp: 97.8 F (36.6 C)  SpO2: 96%   Filed Weights   02/15/18 1453  Weight: 229 lb 1.6 oz (103.9 kg)      LABORATORY DATA:  I have reviewed the labs as listed.  CBC    Component Value Date/Time   WBC 4.7 02/15/2018 1144   RBC 4.42 02/15/2018 1144   HGB 12.5 (L) 02/15/2018 1144   HCT 39.4 02/15/2018 1144   PLT 172 02/15/2018 1144   MCV 89.1 02/15/2018 1144   MCH 28.3 02/15/2018 1144   MCHC 31.7 02/15/2018 1144   RDW 14.9 02/15/2018 1144   LYMPHSABS 1.3 02/15/2018 1144   MONOABS 0.3 02/15/2018 1144   EOSABS 0.2  02/15/2018 1144   BASOSABS 0.0 02/15/2018 1144   CMP Latest Ref Rng & Units 02/15/2018 10/28/2017 09/12/2016  Glucose 65 - 99 mg/dL 113(H) - 101(H)  BUN 6 - 20 mg/dL 18 - 17  Creatinine 0.61 - 1.24 mg/dL 0.80 0.90 1.09  Sodium 135 - 145 mmol/L 140 - 139  Potassium 3.5 - 5.1 mmol/L 4.2 - 3.8  Chloride 101 - 111 mmol/L 106 - 110  CO2 22 - 32 mmol/L 24 - 22  Calcium 8.9 - 10.3 mg/dL 9.3 - 8.7(L)  Total Protein 6.5 - 8.1 g/dL 7.2 - -  Total Bilirubin 0.3 - 1.2 mg/dL 0.9 - -  Alkaline Phos 38 - 126 U/L 57 - -  AST 15 - 41 U/L 19 - -  ALT 17 - 63 U/L 22 - -            ASSESSMENT & PLAN:   Prostate cancer ([place]) 1.  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 -Enzalutamide 80 mg daily started on 02/08/2018, with the intent of ramping up the dose if well tolerated.  He already reports decrease in urinary frequency at nighttime since the start of enzalutamide.  He will come back in 2 weeks with a repeat CBC and CMP. - Patient to continue Lupron injections every 4 months at his urologists office  2.  Bone metastasis: He has 1 metastatic site on fluciclovine PET/CT scan, on the right ilium.  We briefly talked about initiating him on denosumab injections.  I plan  to do that after he is able to tolerate enzalutamide well.      Orders placed this encounter:  Orders Placed This Encounter  Procedures  . CBC  . Comprehensive metabolic panel      Derek Jack, MD [place] [phone]

## 2018-02-15 NOTE — Assessment & Plan Note (Signed)
1.  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 -Enzalutamide 80 mg daily started on 02/08/2018, with the intent of ramping up the dose if well tolerated.  He already reports decrease in urinary frequency at nighttime since the start of enzalutamide.  He will come back in 2 weeks with a repeat CBC and CMP. - Patient to continue Lupron injections every 4 months at his urologists office  2.  Bone metastasis: He has 1 metastatic site on fluciclovine PET/CT scan, on the right ilium.  We briefly talked about initiating him on denosumab injections.  I plan to do that after he is able to tolerate enzalutamide well.

## 2018-02-16 LAB — TESTOSTERONE,FREE AND TOTAL
TESTOSTERONE FREE: 1.1 pg/mL — AB (ref 6.6–18.1)
Testosterone: <3 ng/dL — ABNORMAL LOW (ref 264–916)

## 2018-02-17 ENCOUNTER — Telehealth: Payer: Self-pay

## 2018-02-17 NOTE — Telephone Encounter (Signed)
-----   Message from Satira Sark, MD sent at 02/17/2018 12:28 PM EDT ----- Results reviewed. Continue with current medications. A copy of this test should be forwarded to Glenda Chroman, MD.

## 2018-02-17 NOTE — Telephone Encounter (Signed)
Patient notified. Routed to PCP 

## 2018-02-25 ENCOUNTER — Ambulatory Visit: Payer: Medicare Other | Admitting: Urology

## 2018-02-25 DIAGNOSIS — R3121 Asymptomatic microscopic hematuria: Secondary | ICD-10-CM | POA: Diagnosis not present

## 2018-02-25 DIAGNOSIS — C778 Secondary and unspecified malignant neoplasm of lymph nodes of multiple regions: Secondary | ICD-10-CM

## 2018-03-02 ENCOUNTER — Ambulatory Visit: Payer: Medicare Other | Admitting: Urology

## 2018-03-02 DIAGNOSIS — R3914 Feeling of incomplete bladder emptying: Secondary | ICD-10-CM | POA: Diagnosis not present

## 2018-03-02 DIAGNOSIS — N401 Enlarged prostate with lower urinary tract symptoms: Secondary | ICD-10-CM | POA: Diagnosis not present

## 2018-03-07 ENCOUNTER — Telehealth (HOSPITAL_COMMUNITY): Payer: Self-pay | Admitting: Hematology

## 2018-03-07 ENCOUNTER — Other Ambulatory Visit (HOSPITAL_COMMUNITY): Payer: Self-pay | Admitting: *Deleted

## 2018-03-07 DIAGNOSIS — C61 Malignant neoplasm of prostate: Secondary | ICD-10-CM

## 2018-03-07 MED ORDER — ENZALUTAMIDE 40 MG PO CAPS: 120.0000 mg | ORAL_CAPSULE | Freq: Every day | ORAL | 0 refills | Status: DC

## 2018-03-07 NOTE — Telephone Encounter (Signed)
FAXED [place]

## 2018-03-07 NOTE — Progress Notes (Signed)
Patient states that he feels better than he has in 10 years (since being on [place]). He states that the Gillermina Phy is a miracle drug. [name] is going to increase his Xtandi to 120mg . Message left on patient's vm letting him know that we refilled his medication but refilled it to a higher dosage (120mg  = 3 capsules daily). I asked that he call me back and let me know that he understands my instructions.

## 2018-03-09 ENCOUNTER — Ambulatory Visit (HOSPITAL_COMMUNITY): Payer: Medicare Other | Admitting: Hematology

## 2018-03-09 ENCOUNTER — Other Ambulatory Visit (HOSPITAL_COMMUNITY): Payer: Medicare Other

## 2018-03-09 ENCOUNTER — Other Ambulatory Visit: Payer: Self-pay | Admitting: Cardiology

## 2018-03-14 ENCOUNTER — Other Ambulatory Visit: Payer: Self-pay | Admitting: Cardiology

## 2018-03-15 ENCOUNTER — Inpatient Hospital Stay (HOSPITAL_COMMUNITY): Payer: Medicare Other

## 2018-03-15 DIAGNOSIS — C61 Malignant neoplasm of prostate: Secondary | ICD-10-CM | POA: Diagnosis not present

## 2018-03-15 LAB — COMPREHENSIVE METABOLIC PANEL
ALBUMIN: 3.7 g/dL (ref 3.5–5.0)
ALK PHOS: 52 U/L (ref 38–126)
ALT: 24 U/L (ref 17–63)
ANION GAP: 9 (ref 5–15)
AST: 18 U/L (ref 15–41)
BILIRUBIN TOTAL: 0.8 mg/dL (ref 0.3–1.2)
BUN: 16 mg/dL (ref 6–20)
CALCIUM: 9 mg/dL (ref 8.9–10.3)
CO2: 24 mmol/L (ref 22–32)
CREATININE: 0.84 mg/dL (ref 0.61–1.24)
Chloride: 109 mmol/L (ref 101–111)
GFR calc Af Amer: >60 mL/min (ref >60)
GFR calc non Af Amer: >60 mL/min (ref >60)
GLUCOSE: 112 mg/dL — AB (ref 65–99)
Potassium: 3.9 mmol/L (ref 3.5–5.1)
SODIUM: 142 mmol/L (ref 135–145)
TOTAL PROTEIN: 6.6 g/dL (ref 6.5–8.1)

## 2018-03-15 LAB — CBC
HEMATOCRIT: 37.6 % — AB (ref 39.0–52.0)
HEMOGLOBIN: 11.9 g/dL — AB (ref 13.0–17.0)
MCH: 28 pg (ref 26.0–34.0)
MCHC: 31.6 g/dL (ref 30.0–36.0)
MCV: 88.5 fL (ref 78.0–100.0)
Platelets: 193 10*3/uL (ref 150–400)
RBC: 4.25 MIL/uL (ref 4.22–5.81)
RDW: 15 % (ref 11.5–15.5)
WBC: 4.6 10*3/uL (ref 4.0–10.5)

## 2018-03-16 ENCOUNTER — Ambulatory Visit (HOSPITAL_COMMUNITY): Payer: Medicare Other | Admitting: Hematology

## 2018-03-16 ENCOUNTER — Other Ambulatory Visit: Payer: Self-pay

## 2018-03-16 ENCOUNTER — Inpatient Hospital Stay (HOSPITAL_COMMUNITY): Payer: Medicare Other | Attending: Hematology and Oncology | Admitting: Internal Medicine

## 2018-03-16 VITALS — BP 133/70 | HR 60 | Temp 97.8°F | Resp 18 | Ht 68.0 in | Wt 224.9 lb

## 2018-03-16 DIAGNOSIS — Z87891 Personal history of nicotine dependence: Secondary | ICD-10-CM | POA: Diagnosis not present

## 2018-03-16 DIAGNOSIS — K5909 Other constipation: Secondary | ICD-10-CM | POA: Insufficient documentation

## 2018-03-16 DIAGNOSIS — C7951 Secondary malignant neoplasm of bone: Secondary | ICD-10-CM | POA: Insufficient documentation

## 2018-03-16 DIAGNOSIS — C771 Secondary and unspecified malignant neoplasm of intrathoracic lymph nodes: Secondary | ICD-10-CM | POA: Diagnosis not present

## 2018-03-16 DIAGNOSIS — Z923 Personal history of irradiation: Secondary | ICD-10-CM | POA: Diagnosis not present

## 2018-03-16 DIAGNOSIS — Z79899 Other long term (current) drug therapy: Secondary | ICD-10-CM | POA: Diagnosis not present

## 2018-03-16 DIAGNOSIS — Z85828 Personal history of other malignant neoplasm of skin: Secondary | ICD-10-CM | POA: Diagnosis not present

## 2018-03-16 DIAGNOSIS — G629 Polyneuropathy, unspecified: Secondary | ICD-10-CM | POA: Insufficient documentation

## 2018-03-16 DIAGNOSIS — C61 Malignant neoplasm of prostate: Secondary | ICD-10-CM | POA: Diagnosis not present

## 2018-03-16 DIAGNOSIS — Z7901 Long term (current) use of anticoagulants: Secondary | ICD-10-CM | POA: Insufficient documentation

## 2018-03-16 NOTE — Patient Instructions (Addendum)
[place] at [facility]  Discharge Instructions:  You were seen by Dr. Walden Field today. Follow up with Dr. Raliegh Ip in two weeks.  ______________________________________________________________  Thank you for choosing [place] at [facility] to provide your oncology and hematology care.  To afford each patient quality time with our providers, please arrive at least 15 minutes before your scheduled appointment.  You need to re-schedule your appointment if you arrive 10 or more minutes late.  We strive to give you quality time with our providers, and arriving late affects you and other patients whose appointments are after yours.  Also, if you no show three or more times for appointments you may be dismissed from the clinic.  Again, thank you for choosing [place] at [place] hope is that these requests will allow you access to exceptional care and in a timely manner. _______________________________________________________________  If you have questions after your visit, please contact our office at [phone] between the hours of 8:30 a.m. and 5:00 p.m. Voicemails left after 4:30 p.m. will not be returned until the following business day. _______________________________________________________________  For prescription refill requests, have your pharmacy contact our office. _______________________________________________________________  Recommendations made by the consultant and any test results will be sent to your referring physician. _______________________________________________________________

## 2018-03-16 NOTE — Progress Notes (Signed)
Diagnosis Prostate cancer ([place]) - Plan: CBC with Differential/Platelet, Comprehensive metabolic panel, Lactate dehydrogenase, PSA  Staging Cancer Staging No matching staging information was found for the patient.  CURRENT THERAPY: Enzalutamide 80 mg started on 02/08/2018.  Assessment and Plan:  1.  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  Pt was started on Enzalutamide 80 mg daily on 02/08/2018, with the intent of ramping up the dose if well tolerated.  He is now on 120 mg daily (3 pills) and reports he is tolerating the medication.  I have discussed with him recommended dose is 160 mg daily and if he is tolerating current dose, he can increase to 160 mg ( 4 tabs daily) and will RTC in 2 weeks to follow-up with Dr. Worthy Keeler with repeat labs and PSA.  He should notify the office if he develops any change in symptoms once dose has increased. Patient should continue Lupron injections every 4 months at his urologists office  2.  Bone metastasis: He has 1 metastatic site on fluciclovine PET/CT scan, on the right ilium.  He will discuss denosumab injections on RTC in 2 weeks.    Interval History:  84 yr old male with metastatic prostate cancer.  He is on Enzalutamide 80 mg that started on 02/08/2018.    Current Status:  Pt is seen today for follow-up.  He reports he is now taking 3 pills and has been on that dose for less than a week.  He reports he is tolerating therapy.      Prostate cancer ([place])   11/27/2017 Initial Diagnosis    Prostate cancer [facility])        Problem List Patient Active Problem List   Diagnosis Date Noted  . Prostate cancer ([place]) [C61] 11/27/2017  . Atrial fibrillation, rapid -new 09/10/16 [I48.91] 09/10/2016  . PAF- in setting of NSTEMI [I48.0] 09/10/2016  . Accelerating angina ([place]) [I20.0]   . CAD S/P percutaneous coronary angioplasty [I25.10, Z98.61]   . Coronary artery disease with hx of myocardial infarct w/o hx of CABG [I25.10, I25.2] 03/21/2015  . Ischemic cardiomyopathy [I25.5]   . Hyperglycemia [R73.9]   . Essential hypertension [I10]   . BPH (benign prostatic hyperplasia) [N40.0]   . NSTEMI- Troponin peak 6/47 [I21.4] 12/31/2014  . S/P CABG x 4 2004 [Z95.1] 12/31/2014  . GERD (gastroesophageal reflux disease) [K21.9] 04/29/2012  . Mixed hyperlipidemia [E78.2]   . Essential hypertension, benign [I10] 08/06/2009    Past Medical History Past Medical History:  Diagnosis Date  . Arthritis   . Asthma    Childhood  . Chronic back pain   . Coronary atherosclerosis of native coronary artery    a. CABG 2004 with LIMA-LAD, SVG-D1, SVG-OM, SVG-PDA. b. Cath ~2010 with occ of SVG-diagonal, distal LAD and OM filiing by collaterals. c. NSTEMI 12/2014 s/p DES to SVG-RCA. d. 11/2015: DES to distal graft of the SVG-distal RCA  . Essential hypertension   . Foley catheter in place   . GERD (gastroesophageal reflux disease)   . Hard of hearing   . Hyperglycemia   . Ischemic cardiomyopathy    a. EF 45% by cath 12/2014.  . Mixed hyperlipidemia   .  NSTEMI (non-ST elevated myocardial infarction) ([place]) 01/01/15  . Pneumonia    hx of   . Prostate cancer [facility])    Radiation therapy 1996  . Skin cancer   . Stroke [facility])    TIA- 28 years ago     Past Surgical History Past Surgical History:  Procedure Laterality Date  . CARDIAC CATHETERIZATION  01/01/2015   Procedure: CORONARY STENT INTERVENTION;  Surgeon: Peter M Martinique, MD;  Location: [facility] CATH LAB;  Service: Cardiovascular;;  SVG to PDA  . CARDIAC CATHETERIZATION N/A 11/21/2015   Procedure: Left Heart Cath and Cors/Grafts  Angiography;  Surgeon: Troy Sine, MD;  Location: [place] CV LAB;  Service: Cardiovascular;  Laterality: N/A;  . CARDIAC CATHETERIZATION N/A 11/21/2015   Procedure: Coronary Stent Intervention;  Surgeon: Troy Sine, MD;  Location: [place] CV LAB;  Service: Cardiovascular;  Laterality: N/A;  . CARDIAC CATHETERIZATION N/A 09/11/2016   Procedure: Left Heart Cath and Cors/Grafts Angiography;  Surgeon: Leonie Man, MD;  Location: [place] CV LAB;  Service: Cardiovascular;  Laterality: N/A;  . CARDIAC CATHETERIZATION N/A 09/11/2016   Procedure: Coronary Stent Intervention;  Surgeon: Leonie Man, MD;  Location: [place] CV LAB;  Service: Cardiovascular;  Laterality: N/A;  . CORONARY ARTERY BYPASS GRAFT  2004   LIMA to LAD, SVG to diagonal, SVG to circumflex, SVG to PDA  . GREEN LIGHT LASER TURP (TRANSURETHRAL RESECTION OF PROSTATE N/A 06/04/2016   Procedure: GREEN LIGHT LASER TURP (TRANSURETHRAL RESECTION OF PROSTATE;  Surgeon: Irine Seal, MD;  Location: [facility] ORS;  Service: Urology;  Laterality: N/A;  . LEFT HEART CATHETERIZATION WITH CORONARY/GRAFT ANGIOGRAM N/A 01/01/2015   Procedure: LEFT HEART CATHETERIZATION WITH Beatrix Fetters;  Surgeon: Peter M Martinique, MD;  Location: [facility] CATH LAB;  Service: Cardiovascular;  Laterality: N/A;  . PERCUTANEOUS CORONARY STENT INTERVENTION (PCI-S)  11/20/2015   distal SVG  with DES       . TONSILLECTOMY      Family History Family History  Problem Relation Age of Onset  . Hypertension Father   . Coronary artery disease Father      Social History  reports that he quit smoking about 31 years ago. His smoking use included cigars. He started smoking about 53 years ago. He has a 13.50 pack-year smoking history. He quit smokeless tobacco use about 7 years ago. His smokeless tobacco use included chew. He reports that he does not drink alcohol or use drugs.  Medications  Current Outpatient Medications:  .  acetaminophen (TYLENOL) 325  MG tablet, Take 2 tablets (650 mg total) by mouth every 4 (four) hours as needed for headache or mild pain., Disp: , Rfl:  .  amLODipine (NORVASC) 5 MG tablet, TAKE ONE TABLET BY MOUTH DAILY (MORNING), Disp: 30 tablet, Rfl: 3 .  apixaban (ELIQUIS) 5 MG TABS tablet, Take 1 tablet (5 mg total) by mouth 2 (two) times daily., Disp: 56 tablet, Rfl: 0 .  bisacodyl (DULCOLAX) 5 MG EC tablet, Take 5-10 mg by mouth daily as needed (constipation). Reported on 05/28/2016, Disp: , Rfl:  .  clopidogrel (PLAVIX) 75 MG tablet, TAKE ONE TABLET BY MOUTH EVERY DAY (,EVENING), Disp: 30 tablet, Rfl: 6 .  doxazosin (CARDURA) 4 MG tablet, TAKE ONE TABLET BY MOUTH DAILY (,EVENING), Disp: 90 tablet, Rfl: 1 .  ELIQUIS 5 MG TABS tablet, TAKE ONE TABLET BY MOUTH TWICE DAILY (MORNING ,EVENING), Disp: 60 tablet, Rfl: 6 .  enzalutamide (XTANDI) 40 MG capsule, Take 3 capsules (120 mg  total) by mouth daily., Disp: 90 capsule, Rfl: 0 .  isosorbide mononitrate (IMDUR) 60 MG 24 hr tablet, TAKE ONE TABLET BY MOUTH DAILY (MORNING), Disp: 30 tablet, Rfl: 6 .  metoprolol tartrate (LOPRESSOR) 50 MG tablet, TAKE ONE TABLET BY MOUTH TWICE DAILY (MORNING ,EVENING), Disp: 60 tablet, Rfl: 3 .  nitroGLYCERIN (NITROSTAT) 0.4 MG SL tablet, PLACE ONE (1) TABLET UNDER TONGUE EVERY 5 MINUTES UP TO (3) DOSES AS NEEDED FOR CHEST PAIN., Disp: 25 tablet, Rfl: 3 .  pantoprazole (PROTONIX) 40 MG tablet, Take 1 tablet (40 mg total) by mouth daily., Disp: 90 tablet, Rfl: 3 .  PRESCRIPTION MEDICATION, Hormone shots done at Dr. Ralene Muskrat office once every 4 months (for prostrate) - last injection mid December 2016, Disp: , Rfl:  .  rosuvastatin (CRESTOR) 20 MG tablet, TAKE ONE TABLET BY MOUTH DAILY (EVENING), Disp: 30 tablet, Rfl: 6  Allergies Patient has no known allergies.  Review of Systems Review of Systems - Oncology ROS as per HPI otherwise 12 point ROS is negative.   Physical Exam  Vitals Wt Readings from Last 3 Encounters:  03/16/18 224 lb 14.4  oz (102 kg)  02/15/18 229 lb 1.6 oz (103.9 kg)  02/10/18 229 lb (103.9 kg)   Temp Readings from Last 3 Encounters:  03/16/18 97.8 F (36.6 C) (Oral)  02/15/18 97.8 F (36.6 C) (Oral)  01/28/18 97.8 F (36.6 C) (Oral)   BP Readings from Last 3 Encounters:  03/16/18 133/70  02/15/18 133/63  02/10/18 138/72   Pulse Readings from Last 3 Encounters:  03/16/18 60  02/15/18 66  02/10/18 68   Constitutional: Well-developed, well-nourished, and in no distress.   HENT: Head: Normocephalic and atraumatic.  Mouth/Throat: No oropharyngeal exudate. Mucosa moist. Eyes: Pupils are equal, round, and reactive to light. Conjunctivae are normal. No scleral icterus.  Neck: Normal range of motion. Neck supple. No JVD present.  Cardiovascular: Normal rate, regular rhythm and normal heart sounds.  Exam reveals no gallop and no friction rub.   No murmur heard. Pulmonary/Chest: Effort normal and breath sounds normal. No respiratory distress. No wheezes.No rales.  Abdominal: Soft. Bowel sounds are normal. No distension. There is no tenderness. There is no guarding.  Musculoskeletal: No edema or tenderness.  Lymphadenopathy: No cervical, axillary or supraclavicular adenopathy.  Neurological: Alert and oriented to person, place, and time. No cranial nerve deficit.  Skin: Skin is warm and dry. No rash noted. No erythema. No pallor.  Psychiatric: Affect and judgment normal.   Labs Appointment on 03/15/2018  Component Date Value Ref Range Status  . WBC 03/15/2018 4.6  4.0 - 10.5 K/uL Final  . RBC 03/15/2018 4.25  4.22 - 5.81 MIL/uL Final  . Hemoglobin 03/15/2018 11.9* 13.0 - 17.0 g/dL Final  . HCT 03/15/2018 37.6* 39.0 - 52.0 % Final  . MCV 03/15/2018 88.5  78.0 - 100.0 fL Final  . MCH 03/15/2018 28.0  26.0 - 34.0 pg Final  . MCHC 03/15/2018 31.6  30.0 - 36.0 g/dL Final  . RDW 03/15/2018 15.0  11.5 - 15.5 % Final  . Platelets 03/15/2018 193  150 - 400 K/uL Final   Performed at [facility],  [address], [place], [place] [address]  . Sodium 03/15/2018 142  135 - 145 mmol/L Final  . Potassium 03/15/2018 3.9  3.5 - 5.1 mmol/L Final  . Chloride 03/15/2018 109  101 - 111 mmol/L Final  . CO2 03/15/2018 24  22 - 32 mmol/L Final  . Glucose, Bld 03/15/2018 112*  65 - 99 mg/dL Final  . BUN 03/15/2018 16  6 - 20 mg/dL Final  . Creatinine, Ser 03/15/2018 0.84  0.61 - 1.24 mg/dL Final  . Calcium 03/15/2018 9.0  8.9 - 10.3 mg/dL Final  . Total Protein 03/15/2018 6.6  6.5 - 8.1 g/dL Final  . Albumin 03/15/2018 3.7  3.5 - 5.0 g/dL Final  . AST 03/15/2018 18  15 - 41 U/L Final  . ALT 03/15/2018 24  17 - 63 U/L Final  . Alkaline Phosphatase 03/15/2018 52  38 - 126 U/L Final  . Total Bilirubin 03/15/2018 0.8  0.3 - 1.2 mg/dL Final  . GFR calc non Af Amer 03/15/2018 >60  >60 mL/min Final  . GFR calc Af Amer 03/15/2018 >60  >60 mL/min Final   Comment: (NOTE) The eGFR has been calculated using the CKD EPI equation. This calculation has not been validated in all clinical situations. eGFR's persistently <60 mL/min signify possible Chronic Kidney Disease.   Georgiann Hahn gap 03/15/2018 9  5 - 15 Final   Performed at [facility], [address], [place], [place] [address]     Pathology Orders Placed This Encounter  Procedures  . CBC with Differential/Platelet    Standing Status:   Future    Standing Expiration Date:   03/17/2019  . Comprehensive metabolic panel    Standing Status:   Future    Standing Expiration Date:   03/17/2019  . Lactate dehydrogenase    Standing Status:   Future    Standing Expiration Date:   03/17/2019  . PSA    Standing Status:   Future    Standing Expiration Date:   03/17/2019       Zoila Shutter MD

## 2018-04-08 ENCOUNTER — Encounter (HOSPITAL_COMMUNITY): Payer: Self-pay | Admitting: Hematology

## 2018-04-08 ENCOUNTER — Inpatient Hospital Stay (HOSPITAL_COMMUNITY): Payer: Medicare Other

## 2018-04-08 ENCOUNTER — Other Ambulatory Visit: Payer: Self-pay

## 2018-04-08 ENCOUNTER — Inpatient Hospital Stay (HOSPITAL_COMMUNITY): Payer: Medicare Other | Admitting: Hematology

## 2018-04-08 VITALS — BP 126/73 | HR 61 | Temp 97.8°F | Resp 16 | Wt 227.0 lb

## 2018-04-08 DIAGNOSIS — Z79899 Other long term (current) drug therapy: Secondary | ICD-10-CM

## 2018-04-08 DIAGNOSIS — C7951 Secondary malignant neoplasm of bone: Secondary | ICD-10-CM | POA: Insufficient documentation

## 2018-04-08 DIAGNOSIS — Z85828 Personal history of other malignant neoplasm of skin: Secondary | ICD-10-CM

## 2018-04-08 DIAGNOSIS — Z87891 Personal history of nicotine dependence: Secondary | ICD-10-CM

## 2018-04-08 DIAGNOSIS — G629 Polyneuropathy, unspecified: Secondary | ICD-10-CM

## 2018-04-08 DIAGNOSIS — C61 Malignant neoplasm of prostate: Secondary | ICD-10-CM | POA: Diagnosis not present

## 2018-04-08 DIAGNOSIS — Z7901 Long term (current) use of anticoagulants: Secondary | ICD-10-CM

## 2018-04-08 DIAGNOSIS — Z923 Personal history of irradiation: Secondary | ICD-10-CM

## 2018-04-08 DIAGNOSIS — C771 Secondary and unspecified malignant neoplasm of intrathoracic lymph nodes: Secondary | ICD-10-CM | POA: Diagnosis not present

## 2018-04-08 DIAGNOSIS — K5909 Other constipation: Secondary | ICD-10-CM

## 2018-04-08 LAB — CBC WITH DIFFERENTIAL/PLATELET
BASOS ABS: 0 10*3/uL (ref 0.0–0.1)
Basophils Relative: 1 %
EOS PCT: 6 %
Eosinophils Absolute: 0.3 10*3/uL (ref 0.0–0.7)
HCT: 39.1 % (ref 39.0–52.0)
Hemoglobin: 12.6 g/dL — ABNORMAL LOW (ref 13.0–17.0)
Lymphocytes Relative: 25 %
Lymphs Abs: 1.2 10*3/uL (ref 0.7–4.0)
MCH: 28.9 pg (ref 26.0–34.0)
MCHC: 32.2 g/dL (ref 30.0–36.0)
MCV: 89.7 fL (ref 78.0–100.0)
MONO ABS: 0.3 10*3/uL (ref 0.1–1.0)
Monocytes Relative: 7 %
Neutro Abs: 2.9 10*3/uL (ref 1.7–7.7)
Neutrophils Relative %: 61 %
PLATELETS: 173 10*3/uL (ref 150–400)
RBC: 4.36 MIL/uL (ref 4.22–5.81)
RDW: 15 % (ref 11.5–15.5)
WBC: 4.7 10*3/uL (ref 4.0–10.5)

## 2018-04-08 LAB — COMPREHENSIVE METABOLIC PANEL
ALBUMIN: 3.8 g/dL (ref 3.5–5.0)
ALT: 19 U/L (ref 17–63)
AST: 16 U/L (ref 15–41)
Alkaline Phosphatase: 57 U/L (ref 38–126)
Anion gap: 7 (ref 5–15)
BUN: 20 mg/dL (ref 6–20)
CHLORIDE: 106 mmol/L (ref 101–111)
CO2: 26 mmol/L (ref 22–32)
Calcium: 8.9 mg/dL (ref 8.9–10.3)
Creatinine, Ser: 0.82 mg/dL (ref 0.61–1.24)
GFR calc Af Amer: >60 mL/min (ref >60)
Glucose, Bld: 124 mg/dL — ABNORMAL HIGH (ref 65–99)
POTASSIUM: 3.9 mmol/L (ref 3.5–5.1)
Sodium: 139 mmol/L (ref 135–145)
Total Bilirubin: 0.7 mg/dL (ref 0.3–1.2)
Total Protein: 6.6 g/dL (ref 6.5–8.1)

## 2018-04-08 LAB — LACTATE DEHYDROGENASE: LDH: 187 U/L (ref 98–192)

## 2018-04-08 LAB — PSA: Prostatic Specific Antigen: 0.61 ng/mL (ref 0.00–4.00)

## 2018-04-08 MED ORDER — ENZALUTAMIDE 40 MG PO CAPS: 160.0000 mg | ORAL_CAPSULE | Freq: Every day | ORAL | 0 refills | Status: DC

## 2018-04-08 NOTE — Assessment & Plan Note (Signed)
1.  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 -Enzalutamide 80 mg daily started on 02/08/2018, increased to 3 tablets daily about 2 weeks ago.  He noticed slight worsening of fatigue.  However he is able to do all his activities.  I have suggested him to increase enzalutamide to 4 tablets daily when he gets the next shipment.  We have sent in a new prescription.  I will see him back in 3 weeks for follow-up.  His PSA today has come down to 0.61 from 11.57 on 02/15/2018. - Patient to continue Lupron injections every 4 months at his urologist's (Dr.Wren) office  2.  Bone metastasis: He has 1 metastatic site on fluciclovine PET/CT scan, on the right ilium.  We briefly talked about initiating him on denosumab injections.  We discussed the side effects in detail.  He would like to think about it and that is 9 3 weeks.

## 2018-04-08 NOTE — Progress Notes (Signed)
[address] [place], [place] [address]   CLINIC:  Medical Oncology/Hematology  PCP:  Glenda Chroman, MD [place] [address] [phone]   REASON FOR VISIT:  Follow-up for metastatic prostate cancer.  CURRENT THERAPY: Enzalutamide 120 mg po daily   BRIEF ONCOLOGIC HISTORY:    Prostate cancer ([place])   11/27/2017 Initial Diagnosis    Prostate cancer [facility])        INTERVAL HISTORY:  Mr. Genther 84 y.o. male returns for routine follow-up for prostate cancer.   Currently taking "3 pills" of his enzalutamide; he feels more tired than he did, but it is not affecting his ADLs.  His BLE edema is similar as previous.  He thinks his last Lupron inj was in 02/2018 with Dr. Jeffie Pollock.  Denies N&V or diarrhea.  He has chronic constipation; he takes laxatives/stool softeners PRN.  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    Recommended he increase enzalutamide to "4 pills" (160 mg) per day.   Discussed recommendation of adding Xgeva monthly to improve bone density. Discussed risks and side effects of treatment with bisphosphonate.  Denies any current dental issues; he has dentures and dental implants.  He will think about this and we will discuss at his next visit.  Will build treatment plan and ensure it will be covered by his insurance before next visit.     REVIEW OF SYSTEMS:  Review of Systems - Oncology   PAST MEDICAL/SURGICAL HISTORY:  Past Medical History:  Diagnosis Date  . Arthritis   . Asthma    Childhood  . Chronic back pain   . Coronary atherosclerosis of native coronary artery    a. CABG 2004 with LIMA-LAD, SVG-D1, SVG-OM, SVG-PDA. b. Cath ~2010 with occ of SVG-diagonal, distal LAD and OM filiing by collaterals. c. NSTEMI 12/2014 s/p DES to SVG-RCA. d. 11/2015: DES to distal graft of the SVG-distal RCA  . Essential hypertension   . Foley catheter in place   . GERD (gastroesophageal  reflux disease)   . Hard of hearing   . Hyperglycemia   . Ischemic cardiomyopathy    a. EF 45% by cath 12/2014.  . Mixed hyperlipidemia   . NSTEMI (non-ST elevated myocardial infarction) ([place]) 01/01/15  . Pneumonia    hx of   . Prostate cancer [facility])    Radiation therapy 1996  . Skin cancer   . Stroke [facility])    TIA- 28 years ago    Past Surgical History:  Procedure Laterality Date  . CARDIAC CATHETERIZATION  01/01/2015   Procedure: CORONARY STENT INTERVENTION;  Surgeon: Peter M Martinique, MD;  Location: [facility] CATH LAB;  Service: Cardiovascular;;  SVG to PDA  . CARDIAC CATHETERIZATION N/A 11/21/2015   Procedure: Left Heart Cath and Cors/Grafts Angiography;  Surgeon: Troy Sine, MD;  Location: [place] CV LAB;  Service: Cardiovascular;  Laterality: N/A;  . CARDIAC CATHETERIZATION N/A 11/21/2015   Procedure: Coronary Stent Intervention;  Surgeon: Troy Sine, MD;  Location: [place] CV LAB;  Service: Cardiovascular;  Laterality: N/A;  . CARDIAC CATHETERIZATION N/A 09/11/2016   Procedure: Left Heart Cath and Cors/Grafts Angiography;  Surgeon: Leonie Man, MD;  Location: [place] CV LAB;  Service: Cardiovascular;  Laterality: N/A;  . CARDIAC CATHETERIZATION N/A 09/11/2016   Procedure: Coronary Stent Intervention;  Surgeon: Leonie Man, MD;  Location: [place] CV LAB;  Service: Cardiovascular;  Laterality: N/A;  .  CORONARY ARTERY BYPASS GRAFT  2004   LIMA to LAD, SVG to diagonal, SVG to circumflex, SVG to PDA  . GREEN LIGHT LASER TURP (TRANSURETHRAL RESECTION OF PROSTATE N/A 06/04/2016   Procedure: GREEN LIGHT LASER TURP (TRANSURETHRAL RESECTION OF PROSTATE;  Surgeon: Irine Seal, MD;  Location[facility];  Service: Urology;  Laterality: N/A;  . LEFT HEART CATHETERIZATION WITH CORONARY/GRAFT ANGIOGRAM N/A 01/01/2015   Procedure: LEFT HEART CATHETERIZATION WITH Beatrix Fetters;  Surgeon: Peter M Martinique, MD;  [facility];  Service: Cardiovascular;  Laterality: N/A;    . PERCUTANEOUS CORONARY STENT INTERVENTION (PCI-S)  11/20/2015   distal SVG  with DES       . TONSILLECTOMY       SOCIAL HISTORY:  Social History   Socioeconomic History  . Marital status: Married    Spouse name: Not on file  . Number of children: Not on file  . Years of education: Not on file  . Highest education level: Not on file  Occupational History  . Occupation: Retired    Comment: Office manager  Social Needs  . Financial resource strain: Not on file  . Food insecurity:    Worry: Not on file    Inability: Not on file  . Transportation needs:    Medical: Not on file    Non-medical: Not on file  Tobacco Use  . Smoking status: Former Smoker    Packs/day: 0.50    Years: 27.00    Pack years: 13.50    Types: Cigars    Start date: 07/28/1964    Last attempt to quit: 07/28/1986    Years since quitting: 31.7  . Smokeless tobacco: Former Systems developer    Types: Chew    Quit date: 08/07/2010  . Tobacco comment: never chewed up over a pack/day  Substance and Sexual Activity  . Alcohol use: No    Alcohol/week: 0.0 oz  . Drug use: No  . Sexual activity: Not on file  Lifestyle  . Physical activity:    Days per week: Not on file    Minutes per session: Not on file  . Stress: Not on file  Relationships  . Social connections:    Talks on phone: Not on file    Gets together: Not on file    Attends religious service: Not on file    Active member of club or organization: Not on file    Attends meetings of clubs or organizations: Not on file    Relationship status: Not on file  . Intimate partner violence:    Fear of current or ex partner: Not on file    Emotionally abused: Not on file    Physically abused: Not on file    Forced sexual activity: Not on file  Other Topics Concern  . Not on file  Social History Narrative  . Not on file    FAMILY HISTORY:  Family History  Problem Relation Age of Onset  . Hypertension Father   . Coronary artery disease Father      CURRENT MEDICATIONS:  Outpatient Encounter Medications as of 04/08/2018  Medication Sig  . acetaminophen (TYLENOL) 325 MG tablet Take 2 tablets (650 mg total) by mouth every 4 (four) hours as needed for headache or mild pain.  Marland Kitchen amLODipine (NORVASC) 5 MG tablet TAKE ONE TABLET BY MOUTH DAILY (MORNING)  . apixaban (ELIQUIS) 5 MG TABS tablet Take 1 tablet (5 mg total) by mouth 2 (two) times daily.  . bisacodyl (DULCOLAX) 5  MG EC tablet Take 5-10 mg by mouth daily as needed (constipation). Reported on 05/28/2016  . clopidogrel (PLAVIX) 75 MG tablet TAKE ONE TABLET BY MOUTH EVERY DAY (,EVENING)  . doxazosin (CARDURA) 4 MG tablet TAKE ONE TABLET BY MOUTH DAILY (,EVENING)  . ELIQUIS 5 MG TABS tablet TAKE ONE TABLET BY MOUTH TWICE DAILY (MORNING ,EVENING)  . enzalutamide (XTANDI) 40 MG capsule Take 4 capsules (160 mg total) by mouth daily.  . isosorbide mononitrate (IMDUR) 60 MG 24 hr tablet TAKE ONE TABLET BY MOUTH DAILY (MORNING)  . metoprolol tartrate (LOPRESSOR) 50 MG tablet TAKE ONE TABLET BY MOUTH TWICE DAILY (MORNING ,EVENING)  . nitroGLYCERIN (NITROSTAT) 0.4 MG SL tablet PLACE ONE (1) TABLET UNDER TONGUE EVERY 5 MINUTES UP TO (3) DOSES AS NEEDED FOR CHEST PAIN.  Marland Kitchen pantoprazole (PROTONIX) 40 MG tablet Take 1 tablet (40 mg total) by mouth daily.  Marland Kitchen PRESCRIPTION MEDICATION Hormone shots done at Dr. Ralene Muskrat office once every 4 months (for prostrate) - last injection mid December 2016  . rosuvastatin (CRESTOR) 20 MG tablet TAKE ONE TABLET BY MOUTH DAILY (EVENING)  . tamsulosin (FLOMAX) 0.4 MG CAPS capsule TAKE ONE CAPSULE BY MOUTH TWICE DAILY. (MORNING ,EVENING)  . [DISCONTINUED] enzalutamide (XTANDI) 40 MG capsule Take 3 capsules (120 mg total) by mouth daily.  . [DISCONTINUED] enzalutamide (XTANDI) 40 MG capsule Take 4 capsules (160 mg total) by mouth daily.   No facility-administered encounter medications on file as of 04/08/2018.     ALLERGIES:  No Known Allergies   PHYSICAL EXAM:   ECOG Performance status: 1  Vitals:   04/08/18 1049  BP: 126/73  Pulse: 61  Resp: 16  Temp: 97.8 F (36.6 C)  SpO2: 97%   Filed Weights   04/08/18 1049  Weight: 227 lb (103 kg)      LABORATORY DATA:  I have reviewed the labs as listed.  CBC    Component Value Date/Time   WBC 4.7 04/08/2018 0921   RBC 4.36 04/08/2018 0921   HGB 12.6 (L) 04/08/2018 0921   HCT 39.1 04/08/2018 0921   PLT 173 04/08/2018 0921   MCV 89.7 04/08/2018 0921   MCH 28.9 04/08/2018 0921   MCHC 32.2 04/08/2018 0921   RDW 15.0 04/08/2018 0921   LYMPHSABS 1.2 04/08/2018 0921   MONOABS 0.3 04/08/2018 0921   EOSABS 0.3 04/08/2018 0921   BASOSABS 0.0 04/08/2018 0921   CMP Latest Ref Rng & Units 04/08/2018 03/15/2018 02/15/2018  Glucose 65 - 99 mg/dL 124(H) 112(H) 113(H)  BUN 6 - 20 mg/dL 20 16 18   Creatinine 0.61 - 1.24 mg/dL 0.82 0.84 0.80  Sodium 135 - 145 mmol/L 139 142 140  Potassium 3.5 - 5.1 mmol/L 3.9 3.9 4.2  Chloride 101 - 111 mmol/L 106 109 106  CO2 22 - 32 mmol/L 26 24 24   Calcium 8.9 - 10.3 mg/dL 8.9 9.0 9.3  Total Protein 6.5 - 8.1 g/dL 6.6 6.6 7.2  Total Bilirubin 0.3 - 1.2 mg/dL 0.7 0.8 0.9  Alkaline Phos 38 - 126 U/L 57 52 57  AST 15 - 41 U/L 16 18 19   ALT 17 - 63 U/L 19 24 22       ASSESSMENT & PLAN:   Prostate cancer ([place]) 1.  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 -Enzalutamide 80 mg daily started on 02/08/2018, increased to 3 tablets daily about 2 weeks ago.  He noticed slight worsening of fatigue.  However he is able to do all his activities.  I have suggested him to increase enzalutamide to  4 tablets daily when he gets the next shipment.  We have sent in a new prescription.  I will see him back in 3 weeks for follow-up.  His PSA today has come down to 0.61 from 11.57 on 02/15/2018. - Patient to continue Lupron injections every 4 months at his urologist's (Dr.Wren) office  2.  Bone metastasis: He has 1 metastatic site on fluciclovine PET/CT scan, on the right ilium.  We briefly talked about initiating him on denosumab injections.  We discussed the side effects in detail.  He would like to think about it and that is 9 3 weeks.      Orders placed this encounter:  Orders Placed This Encounter  Procedures  . CBC with Differential/Platelet  . Comprehensive metabolic panel      Derek Jack, MD [place] [phone]

## 2018-04-12 ENCOUNTER — Other Ambulatory Visit: Payer: Self-pay | Admitting: Cardiology

## 2018-04-21 ENCOUNTER — Telehealth (HOSPITAL_COMMUNITY): Payer: Self-pay

## 2018-04-21 NOTE — Telephone Encounter (Signed)
Patient called requesting PSA level. Returned call and left message with results on voicemail. Call cancer center if any further questions.

## 2018-05-02 ENCOUNTER — Other Ambulatory Visit: Payer: Self-pay

## 2018-05-02 ENCOUNTER — Inpatient Hospital Stay (HOSPITAL_COMMUNITY): Payer: Medicare Other

## 2018-05-02 ENCOUNTER — Encounter (HOSPITAL_COMMUNITY): Payer: Self-pay | Admitting: Hematology

## 2018-05-02 ENCOUNTER — Inpatient Hospital Stay (HOSPITAL_COMMUNITY): Payer: Medicare Other | Admitting: Hematology

## 2018-05-02 ENCOUNTER — Inpatient Hospital Stay (HOSPITAL_COMMUNITY): Payer: Medicare Other | Attending: Hematology

## 2018-05-02 VITALS — BP 157/73 | HR 60 | Temp 97.8°F | Resp 16 | Wt 220.5 lb

## 2018-05-02 DIAGNOSIS — Z923 Personal history of irradiation: Secondary | ICD-10-CM | POA: Insufficient documentation

## 2018-05-02 DIAGNOSIS — C7951 Secondary malignant neoplasm of bone: Secondary | ICD-10-CM

## 2018-05-02 DIAGNOSIS — R530 Neoplastic (malignant) related fatigue: Secondary | ICD-10-CM

## 2018-05-02 DIAGNOSIS — K5900 Constipation, unspecified: Secondary | ICD-10-CM

## 2018-05-02 DIAGNOSIS — Z8546 Personal history of malignant neoplasm of prostate: Secondary | ICD-10-CM | POA: Insufficient documentation

## 2018-05-02 DIAGNOSIS — Z87891 Personal history of nicotine dependence: Secondary | ICD-10-CM | POA: Diagnosis not present

## 2018-05-02 DIAGNOSIS — Z79899 Other long term (current) drug therapy: Secondary | ICD-10-CM

## 2018-05-02 DIAGNOSIS — Z7901 Long term (current) use of anticoagulants: Secondary | ICD-10-CM | POA: Diagnosis not present

## 2018-05-02 DIAGNOSIS — Z85828 Personal history of other malignant neoplasm of skin: Secondary | ICD-10-CM | POA: Diagnosis not present

## 2018-05-02 DIAGNOSIS — C61 Malignant neoplasm of prostate: Secondary | ICD-10-CM | POA: Diagnosis not present

## 2018-05-02 LAB — CBC WITH DIFFERENTIAL/PLATELET
BASOS PCT: 1 %
Basophils Absolute: 0 10*3/uL (ref 0.0–0.1)
EOS ABS: 0.3 10*3/uL (ref 0.0–0.7)
EOS PCT: 6 %
HCT: 41.1 % (ref 39.0–52.0)
Hemoglobin: 13 g/dL (ref 13.0–17.0)
LYMPHS ABS: 1.1 10*3/uL (ref 0.7–4.0)
Lymphocytes Relative: 24 %
MCH: 28.7 pg (ref 26.0–34.0)
MCHC: 31.6 g/dL (ref 30.0–36.0)
MCV: 90.7 fL (ref 78.0–100.0)
Monocytes Absolute: 0.4 10*3/uL (ref 0.1–1.0)
Monocytes Relative: 9 %
NEUTROS PCT: 60 %
Neutro Abs: 2.8 10*3/uL (ref 1.7–7.7)
PLATELETS: 178 10*3/uL (ref 150–400)
RBC: 4.53 MIL/uL (ref 4.22–5.81)
RDW: 14.8 % (ref 11.5–15.5)
WBC: 4.6 10*3/uL (ref 4.0–10.5)

## 2018-05-02 LAB — COMPREHENSIVE METABOLIC PANEL
ALBUMIN: 3.9 g/dL (ref 3.5–5.0)
ALT: 18 U/L (ref 17–63)
ANION GAP: 7 (ref 5–15)
AST: 15 U/L (ref 15–41)
Alkaline Phosphatase: 60 U/L (ref 38–126)
BUN: 21 mg/dL — ABNORMAL HIGH (ref 6–20)
CHLORIDE: 107 mmol/L (ref 101–111)
CO2: 26 mmol/L (ref 22–32)
Calcium: 9.2 mg/dL (ref 8.9–10.3)
Creatinine, Ser: 0.82 mg/dL (ref 0.61–1.24)
GFR calc non Af Amer: >60 mL/min (ref >60)
GLUCOSE: 123 mg/dL — AB (ref 65–99)
Potassium: 4.1 mmol/L (ref 3.5–5.1)
SODIUM: 140 mmol/L (ref 135–145)
Total Bilirubin: 0.6 mg/dL (ref 0.3–1.2)
Total Protein: 6.7 g/dL (ref 6.5–8.1)

## 2018-05-02 MED ORDER — DENOSUMAB 120 MG/1.7ML ~~LOC~~ SOLN
120.0000 mg | Freq: Once | SUBCUTANEOUS | Status: AC
  Administered 2018-05-02: 120 mg via SUBCUTANEOUS
  Filled 2018-05-02: qty 1.7

## 2018-05-02 NOTE — Patient Instructions (Signed)
[facility] at [facility]  Discharge Instructions: You received an xgeva shot today.  _______________________________________________________________  Thank you for choosing [facility] at [facility] to provide your oncology and hematology care.  To afford each patient quality time with our providers, please arrive at least 15 minutes before your scheduled appointment.  You need to re-schedule your appointment if you arrive 10 or more minutes late.  We strive to give you quality time with our providers, and arriving late affects you and other patients whose appointments are after yours.  Also, if you no show three or more times for appointments you may be dismissed from the clinic.  Again, thank you for choosing [facility] at [facility]. Our hope is that these requests will allow you access to exceptional care and in a timely manner. _______________________________________________________________  If you have questions after your visit, please contact our office at [phone] between the hours of 8:30 a.m. and 5:00 p.m. Voicemails left after 4:30 p.m. will not be returned until the following business day. _______________________________________________________________  For prescription refill requests, have your pharmacy contact our office. _______________________________________________________________  Recommendations made by the consultant and any test results will be sent to your referring physician. _______________________________________________________________ 

## 2018-05-02 NOTE — Progress Notes (Signed)
[address], [place] [address]   CLINIC:  Medical Oncology/Hematology  PCP:  Glenda Chroman, MD [place] [address] [phone]   REASON FOR VISIT:  Follow-up for metastatic prostate cancer.  CURRENT THERAPY: Enzalutamide 4 tablets daily.  BRIEF ONCOLOGIC HISTORY:    Prostate cancer ([place])   11/27/2017 Initial Diagnosis    Prostate cancer [facility])        INTERVAL HISTORY:  Mr. Victory 84 y.o. male returns for follow-up of metastatic prostate cancer.  He started taking enzalutamide full dose 4 tablets daily about 2 weeks ago.  He is tolerating it very well.  His energy levels are stable.  He denies any new onset pains.  His wife was reportedly in the hospital with atrial fibrillation and had a pacemaker placed.  He denies any ER visits.  Appetite has been good.   REVIEW OF SYSTEMS:  Review of Systems  Constitutional: Positive for fatigue.  Gastrointestinal: Positive for constipation.  Hematological: Bruises/bleeds easily.  All other systems reviewed and are negative.    PAST MEDICAL/SURGICAL HISTORY:  Past Medical History:  Diagnosis Date  . Arthritis   . Asthma    Childhood  . Chronic back pain   . Coronary atherosclerosis of native coronary artery    a. CABG 2004 with LIMA-LAD, SVG-D1, SVG-OM, SVG-PDA. b. Cath ~2010 with occ of SVG-diagonal, distal LAD and OM filiing by collaterals. c. NSTEMI 12/2014 s/p DES to SVG-RCA. d. 11/2015: DES to distal graft of the SVG-distal RCA  . Essential hypertension   . Foley catheter in place   . GERD (gastroesophageal reflux disease)   . Hard of hearing   . Hyperglycemia   . Ischemic cardiomyopathy    a. EF 45% by cath 12/2014.  . Mixed hyperlipidemia   . NSTEMI (non-ST elevated myocardial infarction) ([place]) 01/01/15  . Pneumonia    hx of   . Prostate cancer [facility])    Radiation therapy 1996  . Skin cancer   . Stroke [facility])    TIA- 28 years ago    Past Surgical History:  Procedure  Laterality Date  . CARDIAC CATHETERIZATION  01/01/2015   Procedure: CORONARY STENT INTERVENTION;  Surgeon: Peter M Martinique, MD;  Location: [facility] CATH LAB;  Service: Cardiovascular;;  SVG to PDA  . CARDIAC CATHETERIZATION N/A 11/21/2015   Procedure: Left Heart Cath and Cors/Grafts Angiography;  Surgeon: Troy Sine, MD;  Location: [place] CV LAB;  Service: Cardiovascular;  Laterality: N/A;  . CARDIAC CATHETERIZATION N/A 11/21/2015   Procedure: Coronary Stent Intervention;  Surgeon: Troy Sine, MD;  Location: [place] CV LAB;  Service: Cardiovascular;  Laterality: N/A;  . CARDIAC CATHETERIZATION N/A 09/11/2016   Procedure: Left Heart Cath and Cors/Grafts Angiography;  Surgeon: Leonie Man, MD;  Location: [place] CV LAB;  Service: Cardiovascular;  Laterality: N/A;  . CARDIAC CATHETERIZATION N/A 09/11/2016   Procedure: Coronary Stent Intervention;  Surgeon: Leonie Man, MD;  Location: [place] CV LAB;  Service: Cardiovascular;  Laterality: N/A;  . CORONARY ARTERY BYPASS GRAFT  2004   LIMA to LAD, SVG to diagonal, SVG to circumflex, SVG to PDA  . GREEN LIGHT LASER TURP (TRANSURETHRAL RESECTION OF PROSTATE N/A 06/04/2016   Procedure: GREEN LIGHT LASER TURP (TRANSURETHRAL RESECTION OF PROSTATE;  Surgeon: Irine Seal, MD;  Location: [facility] ORS;  Service: Urology;  Laterality: N/A;  . LEFT HEART CATHETERIZATION WITH CORONARY/GRAFT ANGIOGRAM N/A 01/01/2015   Procedure: LEFT HEART  CATHETERIZATION WITH Beatrix Fetters;  Surgeon: Peter M Martinique, MD;  Location: [facility] CATH LAB;  Service: Cardiovascular;  Laterality: N/A;  . PERCUTANEOUS CORONARY STENT INTERVENTION (PCI-S)  11/20/2015   distal SVG  with DES       . TONSILLECTOMY       SOCIAL HISTORY:  Social History   Socioeconomic History  . Marital status: Married    Spouse name: Not on file  . Number of children: Not on file  . Years of education: Not on file  . Highest education level: Not on file  Occupational History  .  Occupation: Retired    Comment: Office manager  Social Needs  . Financial resource strain: Not on file  . Food insecurity:    Worry: Not on file    Inability: Not on file  . Transportation needs:    Medical: Not on file    Non-medical: Not on file  Tobacco Use  . Smoking status: Former Smoker    Packs/day: 0.50    Years: 27.00    Pack years: 13.50    Types: Cigars    Start date: 07/28/1964    Last attempt to quit: 07/28/1986    Years since quitting: 31.7  . Smokeless tobacco: Former Systems developer    Types: Chew    Quit date: 08/07/2010  . Tobacco comment: never chewed up over a pack/day  Substance and Sexual Activity  . Alcohol use: No    Alcohol/week: 0.0 oz  . Drug use: No  . Sexual activity: Not on file  Lifestyle  . Physical activity:    Days per week: Not on file    Minutes per session: Not on file  . Stress: Not on file  Relationships  . Social connections:    Talks on phone: Not on file    Gets together: Not on file    Attends religious service: Not on file    Active member of club or organization: Not on file    Attends meetings of clubs or organizations: Not on file    Relationship status: Not on file  . Intimate partner violence:    Fear of current or ex partner: Not on file    Emotionally abused: Not on file    Physically abused: Not on file    Forced sexual activity: Not on file  Other Topics Concern  . Not on file  Social History Narrative  . Not on file    FAMILY HISTORY:  Family History  Problem Relation Age of Onset  . Hypertension Father   . Coronary artery disease Father     CURRENT MEDICATIONS:  Outpatient Encounter Medications as of 05/02/2018  Medication Sig  . acetaminophen (TYLENOL) 325 MG tablet Take 2 tablets (650 mg total) by mouth every 4 (four) hours as needed for headache or mild pain.  Marland Kitchen amLODipine (NORVASC) 5 MG tablet TAKE ONE TABLET BY MOUTH DAILY (MORNING)  . apixaban (ELIQUIS) 5 MG TABS tablet Take 1 tablet (5 mg total) by  mouth 2 (two) times daily.  . bisacodyl (DULCOLAX) 5 MG EC tablet Take 5-10 mg by mouth daily as needed (constipation). Reported on 05/28/2016  . clopidogrel (PLAVIX) 75 MG tablet TAKE ONE TABLET BY MOUTH EVERY DAY (,EVENING)  . doxazosin (CARDURA) 4 MG tablet TAKE ONE TABLET BY MOUTH DAILY (,EVENING)  . ELIQUIS 5 MG TABS tablet TAKE ONE TABLET BY MOUTH TWICE DAILY (MORNING ,EVENING)  . enzalutamide (XTANDI) 40 MG capsule Take 4 capsules (160 mg total) by  mouth daily.  . isosorbide mononitrate (IMDUR) 60 MG 24 hr tablet TAKE ONE TABLET BY MOUTH DAILY (MORNING)  . metoprolol tartrate (LOPRESSOR) 50 MG tablet TAKE ONE TABLET BY MOUTH TWICE DAILY (MORNING ,EVENING)  . nitroGLYCERIN (NITROSTAT) 0.4 MG SL tablet PLACE ONE (1) TABLET UNDER TONGUE EVERY 5 MINUTES UP TO (3) DOSES AS NEEDED FOR CHEST PAIN.  Marland Kitchen pantoprazole (PROTONIX) 40 MG tablet Take 1 tablet (40 mg total) by mouth daily.  Marland Kitchen PRESCRIPTION MEDICATION Hormone shots done at Dr. Ralene Muskrat office once every 4 months (for prostrate) - last injection mid December 2016  . rosuvastatin (CRESTOR) 20 MG tablet TAKE ONE TABLET BY MOUTH DAILY (EVENING)  . tamsulosin (FLOMAX) 0.4 MG CAPS capsule TAKE ONE CAPSULE BY MOUTH TWICE DAILY. (MORNING ,EVENING)   No facility-administered encounter medications on file as of 05/02/2018.     ALLERGIES:  No Known Allergies   PHYSICAL EXAM:  ECOG Performance status: 1  Vitals:   05/02/18 1028  BP: (!) 157/73  Pulse: 60  Resp: 16  Temp: 97.8 F (36.6 C)  SpO2: 97%   Filed Weights   05/02/18 1028  Weight: 220 lb 8 oz (100 kg)    Physical Exam 1+ edema bilaterally and stable.  LABORATORY DATA:  I have reviewed the labs as listed.  CBC    Component Value Date/Time   WBC 4.6 05/02/2018 0844   RBC 4.53 05/02/2018 0844   HGB 13.0 05/02/2018 0844   HCT 41.1 05/02/2018 0844   PLT 178 05/02/2018 0844   MCV 90.7 05/02/2018 0844   MCH 28.7 05/02/2018 0844   MCHC 31.6 05/02/2018 0844   RDW 14.8  05/02/2018 0844   LYMPHSABS 1.1 05/02/2018 0844   MONOABS 0.4 05/02/2018 0844   EOSABS 0.3 05/02/2018 0844   BASOSABS 0.0 05/02/2018 0844   CMP Latest Ref Rng & Units 05/02/2018 04/08/2018 03/15/2018  Glucose 65 - 99 mg/dL 123(H) 124(H) 112(H)  BUN 6 - 20 mg/dL 21(H) 20 16  Creatinine 0.61 - 1.24 mg/dL 0.82 0.82 0.84  Sodium 135 - 145 mmol/L 140 139 142  Potassium 3.5 - 5.1 mmol/L 4.1 3.9 3.9  Chloride 101 - 111 mmol/L 107 106 109  CO2 22 - 32 mmol/L 26 26 24   Calcium 8.9 - 10.3 mg/dL 9.2 8.9 9.0  Total Protein 6.5 - 8.1 g/dL 6.7 6.6 6.6  Total Bilirubin 0.3 - 1.2 mg/dL 0.6 0.7 0.8  Alkaline Phos 38 - 126 U/L 60 57 52  AST 15 - 41 U/L 15 16 18   ALT 17 - 63 U/L 18 19 24        ASSESSMENT & PLAN:   Prostate cancer ([place]) 1.  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 -Enzalutamide 80 mg daily started on 02/08/2018, increased to 3 tablets daily,  increased to full dose about 2 weeks ago, tolerating it very well.  No side effects were noted.  Energy levels are stable.  He will continue at the same dose of 4 tablets daily.  I will see him back in 4 weeks for follow-up.  I will repeat a PSA at that time.  His last PSA has come down to 0.61 from 11.57. -  Patient to continue Lupron injections every 4 months at his urologist's (Dr.Wren) office  2.  Bone metastasis: He has 1 metastatic site on fluciclovine PET/CT scan, on the right ilium.  We talked about initiating him on denosumab.  We talked about side effects including but not limited to hypocalcemia, osteonecrosis of the jaw.  He is agreeable to treatment.  He will  proceed with his first injection today.  He was told to take calcium and vitamin D twice daily.       Orders placed this encounter:  Orders Placed This Encounter  Procedures  . CBC with Differential ([place] Only)  . CMP ([place] only)  . PSA      Derek Jack, MD [place] [phone]

## 2018-05-02 NOTE — Progress Notes (Signed)
Patient for [name] shot today.  Information given for review with all questions asked and answered.  Consent signed.  Patient stated he will purchase a calcium tab with vitamin d and take as directed.  Denied tooth, jaw, or leg pain.  Tolerated injection with no complaints voiced.  Site clean and dry with no bruising or swelling noted.  Band aid applied.  VSS with discharge and left ambulatory with no s/s of distress noted.

## 2018-05-02 NOTE — Assessment & Plan Note (Signed)
1.  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 -Enzalutamide 80 mg daily started on 02/08/2018, increased to 3 tablets daily,  increased to full dose about 2 weeks ago, tolerating it very well.  No side effects were noted.  Energy levels are stable.  He will continue at the same dose of 4 tablets daily.  I will see him back in 4 weeks for follow-up.  I will repeat a PSA at that time.  His last PSA has come down to 0.61 from 11.57. - Patient to continue Lupron injections every 4 months at his urologist's (Dr.Wren) office  2.  Bone metastasis: He has 1 metastatic site on fluciclovine PET/CT scan, on the right ilium.  We talked about initiating him on denosumab.  We talked about side effects including but not limited to hypocalcemia, osteonecrosis of the jaw.  He is agreeable to treatment.  He will proceed with his first injection today.  He was told to take calcium and vitamin D twice daily.

## 2018-05-09 ENCOUNTER — Other Ambulatory Visit (HOSPITAL_COMMUNITY): Payer: Self-pay | Admitting: *Deleted

## 2018-05-09 DIAGNOSIS — C61 Malignant neoplasm of prostate: Secondary | ICD-10-CM

## 2018-05-09 MED ORDER — ENZALUTAMIDE 40 MG PO CAPS: 160.0000 mg | ORAL_CAPSULE | Freq: Every day | ORAL | 0 refills | Status: DC

## 2018-05-26 ENCOUNTER — Other Ambulatory Visit (HOSPITAL_COMMUNITY): Payer: Self-pay

## 2018-05-26 DIAGNOSIS — C61 Malignant neoplasm of prostate: Secondary | ICD-10-CM

## 2018-05-26 DIAGNOSIS — C7951 Secondary malignant neoplasm of bone: Secondary | ICD-10-CM

## 2018-05-30 ENCOUNTER — Inpatient Hospital Stay (HOSPITAL_COMMUNITY): Payer: Medicare Other

## 2018-05-30 ENCOUNTER — Inpatient Hospital Stay (HOSPITAL_COMMUNITY): Payer: Medicare Other | Admitting: Hematology

## 2018-05-30 ENCOUNTER — Encounter (HOSPITAL_COMMUNITY): Payer: Self-pay | Admitting: Hematology

## 2018-05-30 ENCOUNTER — Other Ambulatory Visit: Payer: Self-pay

## 2018-05-30 ENCOUNTER — Inpatient Hospital Stay (HOSPITAL_COMMUNITY): Payer: Medicare Other | Attending: Hematology

## 2018-05-30 VITALS — BP 106/65 | HR 57 | Temp 97.7°F | Resp 16 | Wt 217.0 lb

## 2018-05-30 DIAGNOSIS — K5900 Constipation, unspecified: Secondary | ICD-10-CM

## 2018-05-30 DIAGNOSIS — Z87891 Personal history of nicotine dependence: Secondary | ICD-10-CM | POA: Diagnosis not present

## 2018-05-30 DIAGNOSIS — C61 Malignant neoplasm of prostate: Secondary | ICD-10-CM | POA: Insufficient documentation

## 2018-05-30 DIAGNOSIS — Z923 Personal history of irradiation: Secondary | ICD-10-CM | POA: Insufficient documentation

## 2018-05-30 DIAGNOSIS — Z79899 Other long term (current) drug therapy: Secondary | ICD-10-CM | POA: Insufficient documentation

## 2018-05-30 DIAGNOSIS — C7951 Secondary malignant neoplasm of bone: Secondary | ICD-10-CM | POA: Insufficient documentation

## 2018-05-30 DIAGNOSIS — G8929 Other chronic pain: Secondary | ICD-10-CM

## 2018-05-30 DIAGNOSIS — Z85828 Personal history of other malignant neoplasm of skin: Secondary | ICD-10-CM | POA: Diagnosis not present

## 2018-05-30 DIAGNOSIS — M25552 Pain in left hip: Secondary | ICD-10-CM

## 2018-05-30 LAB — COMPREHENSIVE METABOLIC PANEL
ALBUMIN: 3.8 g/dL (ref 3.5–5.0)
ALK PHOS: 49 U/L (ref 38–126)
ALT: 16 U/L (ref 0–44)
AST: 15 U/L (ref 15–41)
Anion gap: 7 (ref 5–15)
BILIRUBIN TOTAL: 0.9 mg/dL (ref 0.3–1.2)
BUN: 17 mg/dL (ref 8–23)
CALCIUM: 8.7 mg/dL — AB (ref 8.9–10.3)
CO2: 26 mmol/L (ref 22–32)
CREATININE: 0.87 mg/dL (ref 0.61–1.24)
Chloride: 107 mmol/L (ref 98–111)
GFR calc Af Amer: >60 mL/min (ref >60)
GLUCOSE: 121 mg/dL — AB (ref 70–99)
Potassium: 4.2 mmol/L (ref 3.5–5.1)
Sodium: 140 mmol/L (ref 135–145)
TOTAL PROTEIN: 6.5 g/dL (ref 6.5–8.1)

## 2018-05-30 LAB — CBC WITH DIFFERENTIAL/PLATELET
BASOS ABS: 0 10*3/uL (ref 0.0–0.1)
Basophils Relative: 1 %
Eosinophils Absolute: 0.3 10*3/uL (ref 0.0–0.7)
Eosinophils Relative: 7 %
HEMATOCRIT: 39.4 % (ref 39.0–52.0)
HEMOGLOBIN: 12.7 g/dL — AB (ref 13.0–17.0)
Lymphocytes Relative: 23 %
Lymphs Abs: 1.1 10*3/uL (ref 0.7–4.0)
MCH: 29 pg (ref 26.0–34.0)
MCHC: 32.2 g/dL (ref 30.0–36.0)
MCV: 90 fL (ref 78.0–100.0)
MONO ABS: 0.4 10*3/uL (ref 0.1–1.0)
Monocytes Relative: 8 %
NEUTROS ABS: 2.9 10*3/uL (ref 1.7–7.7)
NEUTROS PCT: 61 %
Platelets: 166 10*3/uL (ref 150–400)
RBC: 4.38 MIL/uL (ref 4.22–5.81)
RDW: 14.7 % (ref 11.5–15.5)
WBC: 4.7 10*3/uL (ref 4.0–10.5)

## 2018-05-30 LAB — PSA: PROSTATIC SPECIFIC ANTIGEN: 0.36 ng/mL (ref 0.00–4.00)

## 2018-05-30 MED ORDER — DENOSUMAB 120 MG/1.7ML ~~LOC~~ SOLN
120.0000 mg | Freq: Once | SUBCUTANEOUS | Status: AC
  Administered 2018-05-30: 120 mg via SUBCUTANEOUS
  Filled 2018-05-30: qty 1.7

## 2018-05-30 NOTE — Progress Notes (Signed)
Pt's corrected calcium is 8.86.  Okay to give [name] today per [name].   Elijah Santana presents today for injection per the provider's orders.  Xgeva administration without incident; see MAR for injection details.  Patient tolerated procedure well and without incident.  No questions or complaints noted at this time.  Discharged ambulatory.

## 2018-05-30 NOTE — Assessment & Plan Note (Addendum)
1.  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 -Enzalutamide 80 mg daily started on 02/08/2018, increased to 3 tablets daily, increased to full dose of 4 tablets daily in the first week of June.  He is tolerating it very well.  His energy levels are very stable.  He will continue 4 tablets daily.  PSA level from today is pending.  Last PSA has come down to 0.6.  I will see him back in 2 months for follow-up. - Patient to continue Lupron injections every 4 months at his urologist's (Dr.Wren) office.  He has an appointment end of this month.  2.  Bone metastasis: He has 1 metastatic site on fluciclovine PET/CT scan, on the right ilium.  We started him on Xgeva on 05/02/2018.  He did not have any problems with it.  He was encouraged to take calcium and vitamin D twice daily.  He often forgets to take these on a regular basis.

## 2018-05-30 NOTE — Progress Notes (Signed)
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 Nearhood 84 y.o. 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 Patient is doing well with the injections and pills. Patient is having night sweats from the medicine. Patient states he is having some constipation but is taking his stool softener. He continues to get around well and perform all his own ADLs. He complains for new left sided hip pain. Unsure if he injured it or not. He will keep an eye on it and follow up if needed. Denies any new fevers or infections. Energy is appetite are stable.   Wife is still in and out of the hospital with her A-fib and he is concerned about her.   REVIEW OF SYSTEMS:  Review of Systems  Constitutional:       Night sweats  HENT:  Negative.   Eyes: Negative.   Respiratory: Negative.   Cardiovascular: Negative.   Gastrointestinal: Positive for constipation.  Endocrine: Negative.   Genitourinary: Negative.    Musculoskeletal: Negative.   Skin: Negative.   Neurological: Negative.   Hematological: Bruises/bleeds easily.     PAST MEDICAL/SURGICAL HISTORY:  Past Medical History:  Diagnosis Date  . Arthritis   . Asthma    Childhood  . Chronic back pain   . Coronary atherosclerosis of native coronary artery    a. CABG 2004 with LIMA-LAD, SVG-D1, SVG-OM, SVG-PDA. b. Cath ~2010 with occ of SVG-diagonal, distal LAD and OM filiing by collaterals. c. NSTEMI 12/2014 s/p DES to SVG-RCA. d. 11/2015: DES to distal graft of the SVG-distal RCA  . Essential  hypertension   . Foley catheter in place   . GERD (gastroesophageal reflux disease)   . Hard of hearing   . Hyperglycemia   . Ischemic cardiomyopathy    a. EF 45% by cath 12/2014.  . Mixed hyperlipidemia   . NSTEMI (non-ST elevated myocardial infarction) ([place]) 01/01/15  . Pneumonia    hx of   . Prostate cancer [facility])    Radiation therapy 1996  . Skin cancer   . Stroke [facility])    TIA- 28 years ago    Past Surgical History:  Procedure Laterality Date  . CARDIAC CATHETERIZATION  01/01/2015   Procedure: CORONARY STENT INTERVENTION;  Surgeon: Peter M Martinique, MD;  Location: [facility] CATH LAB;  Service: Cardiovascular;;  SVG to PDA  . CARDIAC CATHETERIZATION N/A 11/21/2015   Procedure: Left Heart Cath and Cors/Grafts Angiography;  Surgeon: Troy Sine, MD;  Location: [place] CV LAB;  Service: Cardiovascular;  Laterality: N/A;  . CARDIAC CATHETERIZATION N/A 11/21/2015   Procedure: Coronary Stent Intervention;  Surgeon: Troy Sine, MD;  Location: [place] CV LAB;  Service: Cardiovascular;  Laterality: N/A;  . CARDIAC CATHETERIZATION N/A 09/11/2016   Procedure: Left Heart Cath and Cors/Grafts Angiography;  Surgeon: Leonie Man, MD;  Location: [place] CV LAB;  Service: Cardiovascular;  Laterality: N/A;  . CARDIAC CATHETERIZATION N/A 09/11/2016   Procedure: Coronary Stent Intervention;  Surgeon: Leonie Green  Ellyn Hack, MD;  Location: [place] CV LAB;  Service: Cardiovascular;  Laterality: N/A;  . CORONARY ARTERY BYPASS GRAFT  2004   LIMA to LAD, SVG to diagonal, SVG to circumflex, SVG to PDA  . GREEN LIGHT LASER TURP (TRANSURETHRAL RESECTION OF PROSTATE N/A 06/04/2016   Procedure: GREEN LIGHT LASER TURP (TRANSURETHRAL RESECTION OF PROSTATE;  Surgeon: Irine Seal, MD;  Location: [facility] ORS;  Service: Urology;  Laterality: N/A;  . LEFT HEART CATHETERIZATION WITH CORONARY/GRAFT ANGIOGRAM N/A 01/01/2015   Procedure: LEFT HEART CATHETERIZATION WITH Beatrix Fetters;  Surgeon: Peter M Martinique, MD;   Location: [facility] CATH LAB;  Service: Cardiovascular;  Laterality: N/A;  . PERCUTANEOUS CORONARY STENT INTERVENTION (PCI-S)  11/20/2015   distal SVG  with DES       . TONSILLECTOMY       SOCIAL HISTORY:  Social History   Socioeconomic History  . Marital status: Married    Spouse name: Not on file  . Number of children: Not on file  . Years of education: Not on file  . Highest education level: Not on file  Occupational History  . Occupation: Retired    Comment: Office manager  Social Needs  . Financial resource strain: Not on file  . Food insecurity:    Worry: Not on file    Inability: Not on file  . Transportation needs:    Medical: Not on file    Non-medical: Not on file  Tobacco Use  . Smoking status: Former Smoker    Packs/day: 0.50    Years: 27.00    Pack years: 13.50    Types: Cigars    Start date: 07/28/1964    Last attempt to quit: 07/28/1986    Years since quitting: 31.8  . Smokeless tobacco: Former Systems developer    Types: Chew    Quit date: 08/07/2010  . Tobacco comment: never chewed up over a pack/day  Substance and Sexual Activity  . Alcohol use: No    Alcohol/week: 0.0 oz  . Drug use: No  . Sexual activity: Not on file  Lifestyle  . Physical activity:    Days per week: Not on file    Minutes per session: Not on file  . Stress: Not on file  Relationships  . Social connections:    Talks on phone: Not on file    Gets together: Not on file    Attends religious service: Not on file    Active member of club or organization: Not on file    Attends meetings of clubs or organizations: Not on file    Relationship status: Not on file  . Intimate partner violence:    Fear of current or ex partner: Not on file    Emotionally abused: Not on file    Physically abused: Not on file    Forced sexual activity: Not on file  Other Topics Concern  . Not on file  Social History Narrative  . Not on file    FAMILY HISTORY:  Family History  Problem Relation Age of  Onset  . Hypertension Father   . Coronary artery disease Father     CURRENT MEDICATIONS:  Outpatient Encounter Medications as of 05/30/2018  Medication Sig  . acetaminophen (TYLENOL) 325 MG tablet Take 2 tablets (650 mg total) by mouth every 4 (four) hours as needed for headache or mild pain.  Marland Kitchen amLODipine (NORVASC) 5 MG tablet TAKE ONE TABLET BY MOUTH DAILY (MORNING)  . apixaban (ELIQUIS) 5 MG TABS tablet Take 1  tablet (5 mg total) by mouth 2 (two) times daily.  . bisacodyl (DULCOLAX) 5 MG EC tablet Take 5-10 mg by mouth daily as needed (constipation). Reported on 05/28/2016  . clopidogrel (PLAVIX) 75 MG tablet TAKE ONE TABLET BY MOUTH EVERY DAY (,EVENING)  . doxazosin (CARDURA) 4 MG tablet TAKE ONE TABLET BY MOUTH DAILY (,EVENING)  . ELIQUIS 5 MG TABS tablet TAKE ONE TABLET BY MOUTH TWICE DAILY (MORNING ,EVENING)  . enzalutamide (XTANDI) 40 MG capsule Take 4 capsules (160 mg total) by mouth daily.  . isosorbide mononitrate (IMDUR) 60 MG 24 hr tablet TAKE ONE TABLET BY MOUTH DAILY (MORNING)  . metoprolol tartrate (LOPRESSOR) 50 MG tablet TAKE ONE TABLET BY MOUTH TWICE DAILY (MORNING ,EVENING)  . nitroGLYCERIN (NITROSTAT) 0.4 MG SL tablet PLACE ONE (1) TABLET UNDER TONGUE EVERY 5 MINUTES UP TO (3) DOSES AS NEEDED FOR CHEST PAIN.  Marland Kitchen pantoprazole (PROTONIX) 40 MG tablet Take 1 tablet (40 mg total) by mouth daily.  Marland Kitchen PRESCRIPTION MEDICATION Hormone shots done at Dr. Ralene Muskrat office once every 4 months (for prostrate) - last injection mid December 2016  . rosuvastatin (CRESTOR) 20 MG tablet TAKE ONE TABLET BY MOUTH DAILY (EVENING)  . tamsulosin (FLOMAX) 0.4 MG CAPS capsule TAKE ONE CAPSULE BY MOUTH TWICE DAILY. (MORNING ,EVENING)  . [EXPIRED] denosumab (XGEVA) injection 120 mg    No facility-administered encounter medications on file as of 05/30/2018.     ALLERGIES:  No Known Allergies   PHYSICAL EXAM:  ECOG Performance status: 1  Vitals:   05/30/18 0932  BP: 106/65  Pulse: (!) 57    Resp: 16  Temp: 97.7 F (36.5 C)  SpO2: 97%   Filed Weights   05/30/18 0932  Weight: 217 lb (98.4 kg)    Physical Exam  Cardiovascular: Normal rate, regular rhythm and normal heart sounds.  Pulmonary/Chest: Effort normal and breath sounds normal.     LABORATORY DATA:  I have reviewed the labs as listed.  CBC    Component Value Date/Time   WBC 4.7 05/30/2018 0854   RBC 4.38 05/30/2018 0854   HGB 12.7 (L) 05/30/2018 0854   HCT 39.4 05/30/2018 0854   PLT 166 05/30/2018 0854   MCV 90.0 05/30/2018 0854   MCH 29.0 05/30/2018 0854   MCHC 32.2 05/30/2018 0854   RDW 14.7 05/30/2018 0854   LYMPHSABS 1.1 05/30/2018 0854   MONOABS 0.4 05/30/2018 0854   EOSABS 0.3 05/30/2018 0854   BASOSABS 0.0 05/30/2018 0854   CMP Latest Ref Rng & Units 05/30/2018 05/02/2018 04/08/2018  Glucose 70 - 99 mg/dL 121(H) 123(H) 124(H)  BUN 8 - 23 mg/dL 17 21(H) 20  Creatinine 0.61 - 1.24 mg/dL 0.87 0.82 0.82  Sodium 135 - 145 mmol/L 140 140 139  Potassium 3.5 - 5.1 mmol/L 4.2 4.1 3.9  Chloride 98 - 111 mmol/L 107 107 106  CO2 22 - 32 mmol/L 26 26 26   Calcium 8.9 - 10.3 mg/dL 8.7(L) 9.2 8.9  Total Protein 6.5 - 8.1 g/dL 6.5 6.7 6.6  Total Bilirubin 0.3 - 1.2 mg/dL 0.9 0.6 0.7  Alkaline Phos 38 - 126 U/L 49 60 57  AST 15 - 41 U/L 15 15 16   ALT 0 - 44 U/L 16 18 19         ASSESSMENT & PLAN:   Prostate cancer ([place]) 1.  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 -Enzalutamide 80 mg daily started on 02/08/2018, increased to 3 tablets daily, increased to full dose of 4 tablets daily in the first week of June.  He is  tolerating it very well.  His energy levels are very stable.  He will continue 4 tablets daily.  PSA level from today is pending.  Last PSA has come down to 0.6.  I will see him back in 2 months for follow-up. - Patient to continue Lupron injections every 4 months at his urologist's (Dr.Wren) office.  He has an appointment end of this month.  2.  Bone metastasis: He has 1 metastatic site on fluciclovine PET/CT scan, on the right ilium.  We started him on Xgeva on 05/02/2018.  He did not have any problems with it.  He was encouraged to take calcium and vitamin D twice daily.  He often forgets to take these on a regular basis.      Orders placed this encounter:  Orders Placed This Encounter  Procedures  . CBC with Differential/Platelet  . Comprehensive metabolic panel  . PSA      Derek Jack, MD [place] [phone]

## 2018-05-31 ENCOUNTER — Other Ambulatory Visit (HOSPITAL_COMMUNITY): Payer: Self-pay | Admitting: *Deleted

## 2018-05-31 DIAGNOSIS — C61 Malignant neoplasm of prostate: Secondary | ICD-10-CM

## 2018-05-31 MED ORDER — ENZALUTAMIDE 40 MG PO CAPS: 160.0000 mg | ORAL_CAPSULE | Freq: Every day | ORAL | 0 refills | Status: DC

## 2018-05-31 NOTE — Telephone Encounter (Signed)
Chart reviewed and per Dr. Tomie China last office note, Xtandi refilled.

## 2018-06-27 ENCOUNTER — Inpatient Hospital Stay (HOSPITAL_COMMUNITY): Payer: Medicare Other | Attending: Hematology

## 2018-06-27 ENCOUNTER — Other Ambulatory Visit (HOSPITAL_COMMUNITY): Payer: Self-pay | Admitting: *Deleted

## 2018-06-27 ENCOUNTER — Other Ambulatory Visit: Payer: Self-pay

## 2018-06-27 ENCOUNTER — Inpatient Hospital Stay (HOSPITAL_COMMUNITY): Payer: Medicare Other

## 2018-06-27 VITALS — BP 136/84 | HR 60 | Temp 97.7°F | Resp 18

## 2018-06-27 DIAGNOSIS — C61 Malignant neoplasm of prostate: Secondary | ICD-10-CM

## 2018-06-27 DIAGNOSIS — C7951 Secondary malignant neoplasm of bone: Secondary | ICD-10-CM | POA: Insufficient documentation

## 2018-06-27 LAB — COMPREHENSIVE METABOLIC PANEL
ALT: 15 U/L (ref 0–44)
AST: 17 U/L (ref 15–41)
Albumin: 3.9 g/dL (ref 3.5–5.0)
Alkaline Phosphatase: 42 U/L (ref 38–126)
Anion gap: 8 (ref 5–15)
BUN: 18 mg/dL (ref 8–23)
CO2: 25 mmol/L (ref 22–32)
CREATININE: 0.79 mg/dL (ref 0.61–1.24)
Calcium: 8.8 mg/dL — ABNORMAL LOW (ref 8.9–10.3)
Chloride: 110 mmol/L (ref 98–111)
Glucose, Bld: 136 mg/dL — ABNORMAL HIGH (ref 70–99)
Potassium: 4.3 mmol/L (ref 3.5–5.1)
Sodium: 143 mmol/L (ref 135–145)
Total Bilirubin: 0.9 mg/dL (ref 0.3–1.2)
Total Protein: 6.7 g/dL (ref 6.5–8.1)

## 2018-06-27 MED ORDER — ENZALUTAMIDE 40 MG PO CAPS: 160.0000 mg | ORAL_CAPSULE | Freq: Every day | ORAL | 0 refills | Status: DC

## 2018-06-27 MED ORDER — DENOSUMAB 120 MG/1.7ML ~~LOC~~ SOLN
120.0000 mg | Freq: Once | SUBCUTANEOUS | Status: AC
  Administered 2018-06-27: 120 mg via SUBCUTANEOUS
  Filled 2018-06-27: qty 1.7

## 2018-06-27 NOTE — Progress Notes (Unsigned)
Pt here today for Xgeva injection. Pt stated that he had not been taking his calcium and vitamin D as directed at his last appointment. I advised the pt that he needed to start taking them and to take them twice a day as directed by [name]. Pt verbalized understanding. Pt given injection in his left arm. Pt tolerated injection well. Pt stable and discharged home ambulatory. Pt to return as scheduled.

## 2018-06-27 NOTE — Telephone Encounter (Signed)
Chart reviewed, xtandi refilled.

## 2018-07-01 ENCOUNTER — Ambulatory Visit: Payer: Medicare Other | Admitting: Urology

## 2018-07-01 DIAGNOSIS — N401 Enlarged prostate with lower urinary tract symptoms: Secondary | ICD-10-CM

## 2018-07-01 DIAGNOSIS — Z192 Hormone resistant malignancy status: Secondary | ICD-10-CM

## 2018-07-01 DIAGNOSIS — R351 Nocturia: Secondary | ICD-10-CM

## 2018-07-01 DIAGNOSIS — C7951 Secondary malignant neoplasm of bone: Secondary | ICD-10-CM | POA: Diagnosis not present

## 2018-07-01 DIAGNOSIS — C61 Malignant neoplasm of prostate: Secondary | ICD-10-CM

## 2018-07-22 ENCOUNTER — Other Ambulatory Visit (HOSPITAL_COMMUNITY): Payer: Medicare Other

## 2018-07-25 ENCOUNTER — Inpatient Hospital Stay (HOSPITAL_COMMUNITY): Payer: Medicare Other

## 2018-07-25 ENCOUNTER — Other Ambulatory Visit (HOSPITAL_COMMUNITY): Payer: Self-pay | Admitting: Pharmacist

## 2018-07-25 ENCOUNTER — Encounter (HOSPITAL_COMMUNITY): Payer: Self-pay | Admitting: Internal Medicine

## 2018-07-25 ENCOUNTER — Other Ambulatory Visit (HOSPITAL_COMMUNITY): Payer: Medicare Other

## 2018-07-25 ENCOUNTER — Inpatient Hospital Stay (HOSPITAL_COMMUNITY): Payer: Medicare Other | Attending: Internal Medicine | Admitting: Internal Medicine

## 2018-07-25 VITALS — BP 158/68 | HR 63 | Temp 97.6°F | Resp 18 | Wt 220.0 lb

## 2018-07-25 DIAGNOSIS — Z7901 Long term (current) use of anticoagulants: Secondary | ICD-10-CM | POA: Insufficient documentation

## 2018-07-25 DIAGNOSIS — I10 Essential (primary) hypertension: Secondary | ICD-10-CM | POA: Diagnosis not present

## 2018-07-25 DIAGNOSIS — Z79899 Other long term (current) drug therapy: Secondary | ICD-10-CM

## 2018-07-25 DIAGNOSIS — Z87891 Personal history of nicotine dependence: Secondary | ICD-10-CM | POA: Insufficient documentation

## 2018-07-25 DIAGNOSIS — Z923 Personal history of irradiation: Secondary | ICD-10-CM | POA: Insufficient documentation

## 2018-07-25 DIAGNOSIS — C61 Malignant neoplasm of prostate: Secondary | ICD-10-CM | POA: Diagnosis not present

## 2018-07-25 DIAGNOSIS — C7951 Secondary malignant neoplasm of bone: Secondary | ICD-10-CM | POA: Diagnosis not present

## 2018-07-25 DIAGNOSIS — K5900 Constipation, unspecified: Secondary | ICD-10-CM | POA: Insufficient documentation

## 2018-07-25 DIAGNOSIS — Z85828 Personal history of other malignant neoplasm of skin: Secondary | ICD-10-CM | POA: Insufficient documentation

## 2018-07-25 MED ORDER — DENOSUMAB 120 MG/1.7ML ~~LOC~~ SOLN
120.0000 mg | Freq: Once | SUBCUTANEOUS | Status: AC
  Administered 2018-07-25: 120 mg via SUBCUTANEOUS
  Filled 2018-07-25: qty 1.7

## 2018-07-25 NOTE — Progress Notes (Signed)
Elijah Santana tolerated Xgeva injection without incident or complaint. Discharged self ambulatory in satisfactory condition.

## 2018-07-25 NOTE — Patient Instructions (Addendum)
[facility] Discharge Instructions  Received Xgeva injection today. Follow-up as scheduled. Call clinic for any questions or concerns   Thank you for choosing [facility] at [facility] to provide your oncology and hematology care.  To afford each patient quality time with our provider, please arrive at least 15 minutes before your scheduled appointment time.   If you have a lab appointment with the Cancer Center please come in thru the  Main Entrance and check in at the main information desk  You need to re-schedule your appointment should you arrive 10 or more minutes late.  We strive to give you quality time with our providers, and arriving late affects you and other patients whose appointments are after yours.  Also, if you no show three or more times for appointments you may be dismissed from the clinic at the providers discretion.     Again, thank you for choosing [facility].  Our hope is that these requests will decrease the amount of time that you wait before being seen by our physicians.       _____________________________________________________________  Should you have questions after your visit to [facility], please contact our office at [phone] between the hours of 8:00 a.m. and 4:30 p.m.  Voicemails left after 4:00 p.m. will not be returned until the following business day.  For prescription refill requests, have your pharmacy contact our office and allow 72 hours.    Cancer Center Support Programs:   > Cancer Support Group  2nd Tuesday of the month 1pm-2pm, Journey Room   

## 2018-07-25 NOTE — Progress Notes (Signed)
Diagnosis No diagnosis found.  Staging Cancer Staging No matching staging information was found for the patient.  Assessment and Plan:  Prostate cancer ([place]) 1.  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 -Enzalutamide 80 mg daily started on 02/08/2018, increased to 3 tablets daily, increased to full dose of 4 tablets daily in the first week of June.  He is tolerating it very well.  His energy levels are very stable.  He will continue 4 tablets daily.  Recent PSA level done 05/2018 was 0.36.   - Patient to continue Lupron injections every 4 months at his urologist's (Dr.Wren) office.   He will RTC in 4 weeks for follow-up with Dr. Worthy Keeler with labs.    2.  Bone metastasis: He has 1 metastatic site on fluciclovine PET/CT scan, on the right ilium.  He started [place] on 05/02/2018.  He should continue calcium and vitamin D.  Pt is here for monthly Xgeva.  Labs done 06/2018 reviewed and showed calcium of 8.8.  He will continue monthly Xgeva.  Labs on RTC in 4 weeks.   3.  HTN.  BP is 158/68.  Follow-up with PCP for monitoring.    4.  Constipation.  Stool softeners prn.    Interval History:  1.  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 -Enzalutamide 80 mg daily started on 02/08/2018, increased to 3 tablets daily, increased to full dose of 4 tablets daily in the first week of June.   Current Status:  Pt is seen today for follow-up prior to [facility].      Prostate cancer ([place])   11/27/2017 Initial Diagnosis    Prostate cancer [facility])      Problem List Patient Active Problem List   Diagnosis Date Noted  . Bone metastasis ([place]) [C79.51] 04/08/2018  . Prostate cancer ([place]) [C61] 11/27/2017  . Atrial fibrillation, rapid -new 09/10/16 [I48.91] 09/10/2016  . PAF- in setting of NSTEMI [I48.0] 09/10/2016  . Accelerating angina ([place]) [I20.0]   . CAD S/P percutaneous coronary angioplasty [I25.10, Z98.61]   . Coronary artery disease with hx of myocardial infarct w/o hx of CABG [I25.10, I25.2] 03/21/2015  . Ischemic cardiomyopathy [I25.5]   . Hyperglycemia [R73.9]   . Essential hypertension [I10]   . BPH (benign prostatic hyperplasia) [N40.0]   . NSTEMI- Troponin peak 6/47 [I21.4] 12/31/2014  . S/P CABG x 4 2004 [Z95.1] 12/31/2014  . GERD (gastroesophageal reflux disease) [K21.9] 04/29/2012  . Mixed hyperlipidemia [E78.2]   . Essential hypertension, benign [I10] 08/06/2009    Past Medical History Past Medical History:  Diagnosis Date  . Arthritis   . Asthma    Childhood  . Chronic back pain   . Coronary atherosclerosis of native coronary artery    a. CABG 2004 with LIMA-LAD, SVG-D1, SVG-OM, SVG-PDA. b. Cath ~2010 with occ of SVG-diagonal, distal LAD and OM  filiing by collaterals. c. NSTEMI 12/2014 s/p DES to SVG-RCA. d. 11/2015: DES to distal graft of the SVG-distal RCA  . Essential hypertension   . Foley catheter in place   . GERD (gastroesophageal reflux disease)   . Hard of hearing   . Hyperglycemia   . Ischemic cardiomyopathy    a. EF 45% by cath 12/2014.  . Mixed hyperlipidemia   . NSTEMI (non-ST elevated myocardial infarction) ([place]) 01/01/15  . Pneumonia    hx of   . Prostate cancer [facility])    Radiation  therapy 1996  . Skin cancer   . Stroke [facility])    TIA- 28 years ago     Past Surgical History Past Surgical History:  Procedure Laterality Date  . CARDIAC CATHETERIZATION  01/01/2015   Procedure: CORONARY STENT INTERVENTION;  Surgeon: Peter M Martinique, MD;  Location: [facility] CATH LAB;  Service: Cardiovascular;;  SVG to PDA  . CARDIAC CATHETERIZATION N/A 11/21/2015   Procedure: Left Heart Cath and Cors/Grafts Angiography;  Surgeon: Troy Sine, MD;  Location: [place] CV LAB;  Service: Cardiovascular;  Laterality: N/A;  . CARDIAC CATHETERIZATION N/A 11/21/2015   Procedure: Coronary Stent Intervention;  Surgeon: Troy Sine, MD;  Location: [place] CV LAB;  Service: Cardiovascular;  Laterality: N/A;  . CARDIAC CATHETERIZATION N/A 09/11/2016   Procedure: Left Heart Cath and Cors/Grafts Angiography;  Surgeon: Leonie Man, MD;  Location: [place] CV LAB;  Service: Cardiovascular;  Laterality: N/A;  . CARDIAC CATHETERIZATION N/A 09/11/2016   Procedure: Coronary Stent Intervention;  Surgeon: Leonie Man, MD;  Location: [place] CV LAB;  Service: Cardiovascular;  Laterality: N/A;  . CORONARY ARTERY BYPASS GRAFT  2004   LIMA to LAD, SVG to diagonal, SVG to circumflex, SVG to PDA  . GREEN LIGHT LASER TURP (TRANSURETHRAL RESECTION OF PROSTATE N/A 06/04/2016   Procedure: GREEN LIGHT LASER TURP (TRANSURETHRAL RESECTION OF PROSTATE;  Surgeon: Irine Seal, MD;  Location: [facility] ORS;  Service: Urology;  Laterality: N/A;  . LEFT HEART CATHETERIZATION WITH CORONARY/GRAFT ANGIOGRAM N/A 01/01/2015   Procedure: LEFT HEART CATHETERIZATION WITH Beatrix Fetters;  Surgeon: Peter M Martinique, MD;  Location: [facility] CATH LAB;  Service: Cardiovascular;  Laterality: N/A;  . PERCUTANEOUS CORONARY STENT INTERVENTION (PCI-S)  11/20/2015   distal SVG  with DES       . TONSILLECTOMY      Family History Family History  Problem Relation Age of Onset  . Hypertension Father   . Coronary artery disease Father       Social History  reports that he quit smoking about 32 years ago. His smoking use included cigars. He started smoking about 54 years ago. He has a 13.50 pack-year smoking history. He quit smokeless tobacco use about 7 years ago.  His smokeless tobacco use included chew. He reports that he does not drink alcohol or use drugs.  Medications  Current Outpatient Medications:  .  acetaminophen (TYLENOL) 325 MG tablet, Take 2 tablets (650 mg total) by mouth every 4 (four) hours as needed for headache or mild pain., Disp: , Rfl:  .  amLODipine (NORVASC) 5 MG tablet, TAKE ONE TABLET BY MOUTH DAILY (MORNING), Disp: 30 tablet, Rfl: 3 .  bisacodyl (DULCOLAX) 5 MG EC tablet, Take 5-10 mg by mouth daily as needed (constipation). Reported on 05/28/2016, Disp: , Rfl:  .  clopidogrel (PLAVIX) 75 MG tablet, TAKE ONE TABLET BY MOUTH EVERY DAY (,EVENING), Disp: 30 tablet, Rfl: 6 .  doxazosin (CARDURA) 4 MG tablet,  TAKE ONE TABLET BY MOUTH DAILY (,EVENING), Disp: 90 tablet, Rfl: 1 .  ELIQUIS 5 MG TABS tablet, TAKE ONE TABLET BY MOUTH TWICE DAILY (MORNING ,EVENING), Disp: 60 tablet, Rfl: 6 .  enzalutamide (XTANDI) 40 MG capsule, Take 4 capsules (160 mg total) by mouth daily., Disp: 120 capsule, Rfl: 0 .  isosorbide mononitrate (IMDUR) 60 MG 24 hr tablet, TAKE ONE TABLET BY MOUTH DAILY (MORNING), Disp: 30 tablet, Rfl: 6 .  metoprolol tartrate (LOPRESSOR) 50 MG tablet, TAKE ONE TABLET BY MOUTH TWICE DAILY (MORNING ,EVENING), Disp: 60 tablet, Rfl: 6 .  nitroGLYCERIN (NITROSTAT) 0.4 MG SL tablet, PLACE ONE (1) TABLET UNDER TONGUE EVERY 5 MINUTES UP TO (3) DOSES AS NEEDED FOR CHEST PAIN., Disp: 25 tablet, Rfl: 3 .  pantoprazole (PROTONIX) 40 MG tablet, Take 1 tablet (40 mg total) by mouth daily., Disp: 90 tablet, Rfl: 3 .  PRESCRIPTION MEDICATION, Hormone shots done at Dr. Ralene Muskrat office once every 4 months (for prostrate) - last injection mid December 2016, Disp: , Rfl:  .  rosuvastatin (CRESTOR) 20 MG tablet, TAKE ONE  TABLET BY MOUTH DAILY (EVENING), Disp: 30 tablet, Rfl: 6 .  tamsulosin (FLOMAX) 0.4 MG CAPS capsule, TAKE ONE CAPSULE BY MOUTH TWICE DAILY. (MORNING ,EVENING), Disp: , Rfl: 11  Allergies Patient has no known allergies.  Review of Systems Review of Systems - Oncology ROS negative other than constipation.     Physical Exam  Vitals Wt Readings from Last 3 Encounters:  07/25/18 220 lb (99.8 kg)  05/30/18 217 lb (98.4 kg)  05/02/18 220 lb 8 oz (100 kg)   Temp Readings from Last 3 Encounters:  07/25/18 97.6 F (36.4 C) (Oral)  06/27/18 97.7 F (36.5 C) (Oral)  05/30/18 97.7 F (36.5 C) (Oral)   BP Readings from Last 3 Encounters:  07/25/18 (!) 158/68  06/27/18 136/84  05/30/18 106/65   Pulse Readings from Last 3 Encounters:  07/25/18 63  06/27/18 60  05/30/18 (!) 57   Constitutional: Well-developed, well-nourished, and in no distress.   HENT: Head: Normocephalic and atraumatic.  Mouth/Throat: No oropharyngeal exudate. Mucosa moist. Eyes: Pupils are equal, round, and reactive to light. Conjunctivae are normal. No scleral icterus.  Neck: Normal range of motion. Neck supple. No JVD present.  Cardiovascular: Normal rate, regular rhythm and normal heart sounds.  Exam reveals no gallop and no friction rub.   No murmur heard. Pulmonary/Chest: Effort normal and breath sounds normal. No respiratory distress. No wheezes.No rales.  Abdominal: Soft. Bowel sounds are normal. No distension. There is no tenderness. There is no guarding.  Musculoskeletal: No edema or tenderness.  Lymphadenopathy: No cervical, axillary or supraclavicular adenopathy.  Neurological: Alert and oriented to person, place, and time. No cranial nerve deficit.  Skin: Skin is warm and dry. No rash noted. No erythema. No pallor.  Psychiatric: Affect and judgment normal.   Labs No visits with results within 3 Day(s) from this visit.  Latest known visit with results is:  Appointment on 06/27/2018  Component Date  Value Ref Range Status  . Sodium 06/27/2018 143  135 - 145 mmol/L Final  . Potassium 06/27/2018 4.3  3.5 - 5.1 mmol/L Final  . Chloride 06/27/2018 110  98 - 111 mmol/L Final  . CO2 06/27/2018 25  22 - 32 mmol/L Final  . Glucose, Bld 06/27/2018 136* 70 - 99 mg/dL Final  . BUN 06/27/2018 18  8 - 23 mg/dL Final  . Creatinine, Ser 06/27/2018 0.79  0.61 - 1.24 mg/dL Final  .  Calcium 06/27/2018 8.8* 8.9 - 10.3 mg/dL Final  . Total Protein 06/27/2018 6.7  6.5 - 8.1 g/dL Final  . Albumin 06/27/2018 3.9  3.5 - 5.0 g/dL Final  . AST 06/27/2018 17  15 - 41 U/L Final  . ALT 06/27/2018 15  0 - 44 U/L Final  . Alkaline Phosphatase 06/27/2018 42  38 - 126 U/L Final  . Total Bilirubin 06/27/2018 0.9  0.3 - 1.2 mg/dL Final  . GFR calc non Af Amer 06/27/2018 >60  >60 mL/min Final  . GFR calc Af Amer 06/27/2018 >60  >60 mL/min Final   Comment: (NOTE) The eGFR has been calculated using the CKD EPI equation. This calculation has not been validated in all clinical situations. eGFR's persistently <60 mL/min signify possible Chronic Kidney Disease.   Georgiann Hahn gap 06/27/2018 8  5 - 15 Final   Performed at [facility], [address] [place], [place] [address]     Pathology No orders of the defined types were placed in this encounter.      Zoila Shutter MD

## 2018-07-25 NOTE — Progress Notes (Signed)
[place] Dr. Walden Field approved [name] injection today using Calcium results of 8.8 from 06/27/18.

## 2018-07-25 NOTE — Patient Instructions (Signed)
[facility]  Discharge Instructions:  You were seen by dr. higgs today.  _______________________________________________________________  Thank you for choosing [facility] at [facility] to provide your oncology and hematology care.  To afford each patient quality time with our providers, please arrive at least 15 minutes before your scheduled appointment.  You need to re-schedule your appointment if you arrive 10 or more minutes late.  We strive to give you quality time with our providers, and arriving late affects you and other patients whose appointments are after yours.  Also, if you no show three or more times for appointments you may be dismissed from the clinic.  Again, thank you for choosing  [facility] at [facility]. Our hope is that these requests will allow you access to exceptional care and in a timely manner. _______________________________________________________________  If you have questions after your visit, please contact our office at [phone] between the hours of 8:30 a.m. and 5:00 p.m. Voicemails left after 4:30 p.m. will not be returned until the following business day. _______________________________________________________________  For prescription refill requests, have your pharmacy contact our office. _______________________________________________________________  Recommendations made by the consultant and any test results will be sent to your referring physician. _______________________________________________________________ 

## 2018-08-08 ENCOUNTER — Other Ambulatory Visit (HOSPITAL_COMMUNITY): Payer: Self-pay | Admitting: *Deleted

## 2018-08-08 DIAGNOSIS — C61 Malignant neoplasm of prostate: Secondary | ICD-10-CM

## 2018-08-08 MED ORDER — ENZALUTAMIDE 40 MG PO CAPS: 160.0000 mg | ORAL_CAPSULE | Freq: Every day | ORAL | 0 refills | Status: DC

## 2018-08-10 ENCOUNTER — Other Ambulatory Visit: Payer: Self-pay | Admitting: Cardiology

## 2018-08-10 MED ORDER — APIXABAN 5 MG PO TABS: ORAL_TABLET | ORAL | 0 refills | Status: DC

## 2018-08-10 NOTE — Telephone Encounter (Signed)
Patient walked in  the office for samples of medication:   1.  What medication and dosage are you requesting samples for?  ELIQUIS 5 MG TABS tablet     2.  Are you currently out of this medication? Not yet

## 2018-08-10 NOTE — Telephone Encounter (Signed)
Eliquis samples and eliquis support number provided for patient.

## 2018-08-16 ENCOUNTER — Other Ambulatory Visit: Payer: Self-pay | Admitting: Cardiology

## 2018-08-22 NOTE — Progress Notes (Signed)
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 He is having to get up during the night almost every hour to urinate. He has an appointment with Dr. Jeffie Pollock to follow up with this. He also is having constipation. He reports his appetite at 100%. His energy level is 75%. He denies new pain. Denies any nausea ,vomiting, diarrhea. Denies any fevers or recent infections.    REVIEW OF SYSTEMS:  Review of Systems  Gastrointestinal: Positive for constipation.  Hematological: Bruises/bleeds easily.  All other systems reviewed and are negative.    PAST MEDICAL/SURGICAL HISTORY:  Past Medical History:  Diagnosis Date  . Arthritis   . Asthma    Childhood  . Chronic back pain   . Coronary atherosclerosis of native coronary artery    a. CABG 2004 with LIMA-LAD, SVG-D1, SVG-OM, SVG-PDA. b. Cath ~2010 with occ of SVG-diagonal, distal LAD and OM filiing by collaterals. c. NSTEMI 12/2014 s/p DES to SVG-RCA. d. 11/2015: DES to distal graft of the SVG-distal RCA  . Essential hypertension   . Foley catheter in place   . GERD (gastroesophageal reflux disease)   . Hard of hearing   . Hyperglycemia   . Ischemic cardiomyopathy    a. EF 45% by cath 12/2014.  . Mixed hyperlipidemia   . NSTEMI (non-ST elevated myocardial infarction) ([place]) 01/01/15  . Pneumonia    hx of   . Prostate cancer [facility])    Radiation therapy 1996  . Skin cancer   . Stroke [facility])      TIA- 28 years ago    Past Surgical History:  Procedure Laterality Date  . CARDIAC CATHETERIZATION  01/01/2015   Procedure: CORONARY STENT INTERVENTION;  Surgeon: Peter M Martinique, MD;  Location: [facility] CATH LAB;  Service: Cardiovascular;;  SVG to PDA  . CARDIAC CATHETERIZATION N/A 11/21/2015   Procedure: Left Heart Cath and Cors/Grafts Angiography;  Surgeon: Troy Sine, MD;  Location: [place] CV LAB;  Service: Cardiovascular;  Laterality: N/A;  . CARDIAC CATHETERIZATION N/A 11/21/2015   Procedure: Coronary Stent Intervention;  Surgeon: Troy Sine, MD;  Location: [place] CV LAB;  Service: Cardiovascular;  Laterality: N/A;  . CARDIAC CATHETERIZATION N/A 09/11/2016   Procedure: Left Heart Cath and Cors/Grafts Angiography;  Surgeon: Leonie Man, MD;  Location: [place] CV LAB;  Service: Cardiovascular;  Laterality: N/A;  . CARDIAC CATHETERIZATION N/A 09/11/2016   Procedure: Coronary Stent Intervention;  Surgeon: Leonie Man, MD;  Location: [place] CV LAB;  Service: Cardiovascular;  Laterality: N/A;  . CORONARY ARTERY BYPASS GRAFT  2004   LIMA to LAD, SVG to diagonal, SVG to circumflex, SVG to PDA  . GREEN LIGHT LASER TURP (TRANSURETHRAL RESECTION OF PROSTATE N/A 06/04/2016   Procedure: GREEN LIGHT LASER TURP (TRANSURETHRAL RESECTION OF PROSTATE;  Surgeon: Irine Seal, MD;  Location: [facility]S;  Service: Urology;  Laterality: N/A;  . LEFT HEART CATHETERIZATION WITH CORONARY/GRAFT ANGIOGRAM  N/A 01/01/2015   Procedure: LEFT HEART CATHETERIZATION WITH Beatrix Fetters;  Surgeon: Peter M Martinique, MD;  Location: [facility] CATH LAB;  Service: Cardiovascular;  Laterality: N/A;  . PERCUTANEOUS CORONARY STENT INTERVENTION (PCI-S)  11/20/2015   distal SVG  with DES       . TONSILLECTOMY       SOCIAL HISTORY:  Social History   Socioeconomic History  . Marital status: Married    Spouse name: Not on file  . Number of children: Not on file  . Years of education: Not on file  . Highest  education level: Not on file  Occupational History  . Occupation: Retired    Comment: Office manager  Social Needs  . Financial resource strain: Not on file  . Food insecurity:    Worry: Not on file    Inability: Not on file  . Transportation needs:    Medical: Not on file    Non-medical: Not on file  Tobacco Use  . Smoking status: Former Smoker    Packs/day: 0.50    Years: 27.00    Pack years: 13.50    Types: Cigars    Start date: 07/28/1964    Last attempt to quit: 07/28/1986    Years since quitting: 32.0  . Smokeless tobacco: Former Systems developer    Types: Chew    Quit date: 08/07/2010  . Tobacco comment: never chewed up over a pack/day  Substance and Sexual Activity  . Alcohol use: No    Alcohol/week: 0.0 standard drinks  . Drug use: No  . Sexual activity: Not on file  Lifestyle  . Physical activity:    Days per week: Not on file    Minutes per session: Not on file  . Stress: Not on file  Relationships  . Social connections:    Talks on phone: Not on file    Gets together: Not on file    Attends religious service: Not on file    Active member of club or organization: Not on file    Attends meetings of clubs or organizations: Not on file    Relationship status: Not on file  . Intimate partner violence:    Fear of current or ex partner: Not on file    Emotionally abused: Not on file    Physically abused: Not on file    Forced sexual activity: Not on file  Other Topics Concern  . Not on file  Social History Narrative  . Not on file    FAMILY HISTORY:  Family History  Problem Relation Age of Onset  . Hypertension Father   . Coronary artery disease Father     CURRENT MEDICATIONS:  Outpatient Encounter Medications as of 08/23/2018  Medication Sig  . acetaminophen (TYLENOL) 325 MG tablet Take 2 tablets (650 mg total) by mouth every 4 (four) hours as needed for headache or mild pain.  Marland Kitchen amLODipine (NORVASC) 5 MG tablet TAKE ONE TABLET BY MOUTH DAILY (MORNING)    . apixaban (ELIQUIS) 5 MG TABS tablet TAKE ONE TABLET BY MOUTH TWICE DAILY (MORNING ,EVENING)  . bisacodyl (DULCOLAX) 5 MG EC tablet Take 5-10 mg by mouth daily as needed (constipation). Reported on 05/28/2016  . clopidogrel (PLAVIX) 75 MG tablet TAKE ONE TABLET BY MOUTH EVERY DAY (,EVENING)  . doxazosin (CARDURA) 4 MG tablet TAKE ONE TABLET BY MOUTH DAILY. (,EVENING)  . enzalutamide (XTANDI) 40 MG capsule Take 4 capsules (160 mg total) by mouth daily.  . isosorbide mononitrate (IMDUR) 60 MG 24  hr tablet TAKE ONE TABLET BY MOUTH DAILY (MORNING)  . metoprolol tartrate (LOPRESSOR) 50 MG tablet TAKE ONE TABLET BY MOUTH TWICE DAILY (MORNING ,EVENING)  . nitroGLYCERIN (NITROSTAT) 0.4 MG SL tablet PLACE ONE (1) TABLET UNDER TONGUE EVERY 5 MINUTES UP TO (3) DOSES AS NEEDED FOR CHEST PAIN.  Marland Kitchen pantoprazole (PROTONIX) 40 MG tablet Take 1 tablet (40 mg total) by mouth daily.  Marland Kitchen PRESCRIPTION MEDICATION Hormone shots done at Dr. Ralene Muskrat office once every 4 months (for prostrate) - last injection mid December 2016  . rosuvastatin (CRESTOR) 20 MG tablet TAKE ONE TABLET BY MOUTH DAILY (EVENING)  . tamsulosin (FLOMAX) 0.4 MG CAPS capsule TAKE ONE CAPSULE BY MOUTH TWICE DAILY. (MORNING ,EVENING)   No facility-administered encounter medications on file as of 08/23/2018.     ALLERGIES:  No Known Allergies   PHYSICAL EXAM:  ECOG Performance status: 1  Vitals:   08/23/18 1025  BP: (!) 166/81  Pulse: 64  Resp: 16  Temp: 97.8 F (36.6 C)  SpO2: 99%   Filed Weights   08/23/18 1025  Weight: 217 lb (98.4 kg)    Physical Exam  Constitutional: He is oriented to person, place, and time. He appears well-developed and well-nourished.  Musculoskeletal: Normal range of motion.  Neurological: He is alert and oriented to person, place, and time.  Skin: Skin is warm and dry.  Psychiatric: He has a normal mood and affect. His behavior is normal. Judgment and thought content normal.  Extremities: No edema or  cyanosis.   LABORATORY DATA:  I have reviewed the labs as listed.  CBC    Component Value Date/Time   WBC 4.5 08/23/2018 0918   RBC 4.57 08/23/2018 0918   HGB 13.2 08/23/2018 0918   HCT 41.5 08/23/2018 0918   PLT 192 08/23/2018 0918   MCV 90.8 08/23/2018 0918   MCH 28.9 08/23/2018 0918   MCHC 31.8 08/23/2018 0918   RDW 14.5 08/23/2018 0918   LYMPHSABS 1.2 08/23/2018 0918   MONOABS 0.4 08/23/2018 0918   EOSABS 0.3 08/23/2018 0918   BASOSABS 0.0 08/23/2018 0918   CMP Latest Ref Rng & Units 08/23/2018 06/27/2018 05/30/2018  Glucose 70 - 99 mg/dL 108(H) 136(H) 121(H)  BUN 8 - 23 mg/dL 14 18 17   Creatinine 0.61 - 1.24 mg/dL 0.75 0.79 0.87  Sodium 135 - 145 mmol/L 141 143 140  Potassium 3.5 - 5.1 mmol/L 4.0 4.3 4.2  Chloride 98 - 111 mmol/L 108 110 107  CO2 22 - 32 mmol/L 26 25 26   Calcium 8.9 - 10.3 mg/dL 9.1 8.8(L) 8.7(L)  Total Protein 6.5 - 8.1 g/dL 6.9 6.7 6.5  Total Bilirubin 0.3 - 1.2 mg/dL 1.1 0.9 0.9  Alkaline Phos 38 - 126 U/L 35(L) 42 49  AST 15 - 41 U/L 17 17 15   ALT 0 - 44 U/L 16 15 16           ASSESSMENT & PLAN:   Prostate cancer ([place]) 1.  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 -Enzalutamide 80 mg daily started on 02/08/2018, increased to 3 tablets daily, increased to full dose of 4 tablets daily in the first week of June. - He is  tolerating enzalutamide very well.  No severe fatigue was reported.  His last PSA in July was down to 0.3.  PSA from today's pending. -His only complaint was increased frequency of urination at least one time every hour starting midnight.  This is new  for the last 3 to 4 weeks.  I have asked him to talk to [name] about it.  I will see him back in 2 months for follow-up. - He is receiving Lupron injection every 4 months at Dr. Jethro Poling office.  2.  Bone metastasis: He has 1 metastatic site on fluciclovine PET CT scan on the right ilium. - Xgeva was started on 05/02/2018, and he is tolerating it well. - He was counseled to take calcium and vitamin D on a regular basis.      Orders placed this encounter:  Orders Placed This Encounter  Procedures  . CBC with Differential/Platelet  . Comprehensive metabolic panel  . PSA      Derek Jack, MD [place] [phone]

## 2018-08-23 ENCOUNTER — Inpatient Hospital Stay (HOSPITAL_COMMUNITY): Payer: Medicare Other

## 2018-08-23 ENCOUNTER — Encounter (HOSPITAL_COMMUNITY): Payer: Self-pay | Admitting: Hematology

## 2018-08-23 ENCOUNTER — Inpatient Hospital Stay (HOSPITAL_COMMUNITY): Payer: Medicare Other | Attending: Hematology | Admitting: Hematology

## 2018-08-23 VITALS — BP 166/81 | HR 64 | Temp 97.8°F | Resp 16 | Wt 217.0 lb

## 2018-08-23 DIAGNOSIS — C61 Malignant neoplasm of prostate: Secondary | ICD-10-CM

## 2018-08-23 DIAGNOSIS — Z923 Personal history of irradiation: Secondary | ICD-10-CM | POA: Diagnosis not present

## 2018-08-23 DIAGNOSIS — F17290 Nicotine dependence, other tobacco product, uncomplicated: Secondary | ICD-10-CM | POA: Diagnosis not present

## 2018-08-23 DIAGNOSIS — R350 Frequency of micturition: Secondary | ICD-10-CM | POA: Diagnosis not present

## 2018-08-23 DIAGNOSIS — K5900 Constipation, unspecified: Secondary | ICD-10-CM | POA: Insufficient documentation

## 2018-08-23 DIAGNOSIS — Z79899 Other long term (current) drug therapy: Secondary | ICD-10-CM | POA: Insufficient documentation

## 2018-08-23 DIAGNOSIS — Z7901 Long term (current) use of anticoagulants: Secondary | ICD-10-CM | POA: Insufficient documentation

## 2018-08-23 DIAGNOSIS — C7951 Secondary malignant neoplasm of bone: Secondary | ICD-10-CM | POA: Insufficient documentation

## 2018-08-23 DIAGNOSIS — Z8249 Family history of ischemic heart disease and other diseases of the circulatory system: Secondary | ICD-10-CM | POA: Diagnosis not present

## 2018-08-23 DIAGNOSIS — Z8673 Personal history of transient ischemic attack (TIA), and cerebral infarction without residual deficits: Secondary | ICD-10-CM | POA: Insufficient documentation

## 2018-08-23 DIAGNOSIS — I10 Essential (primary) hypertension: Secondary | ICD-10-CM | POA: Diagnosis not present

## 2018-08-23 DIAGNOSIS — Z85828 Personal history of other malignant neoplasm of skin: Secondary | ICD-10-CM | POA: Insufficient documentation

## 2018-08-23 LAB — COMPREHENSIVE METABOLIC PANEL
ALBUMIN: 4.1 g/dL (ref 3.5–5.0)
ALK PHOS: 35 U/L — AB (ref 38–126)
ALT: 16 U/L (ref 0–44)
AST: 17 U/L (ref 15–41)
Anion gap: 7 (ref 5–15)
BILIRUBIN TOTAL: 1.1 mg/dL (ref 0.3–1.2)
BUN: 14 mg/dL (ref 8–23)
CO2: 26 mmol/L (ref 22–32)
CREATININE: 0.75 mg/dL (ref 0.61–1.24)
Calcium: 9.1 mg/dL (ref 8.9–10.3)
Chloride: 108 mmol/L (ref 98–111)
GFR calc Af Amer: >60 mL/min (ref >60)
GFR calc non Af Amer: >60 mL/min (ref >60)
GLUCOSE: 108 mg/dL — AB (ref 70–99)
POTASSIUM: 4 mmol/L (ref 3.5–5.1)
Sodium: 141 mmol/L (ref 135–145)
TOTAL PROTEIN: 6.9 g/dL (ref 6.5–8.1)

## 2018-08-23 LAB — CBC WITH DIFFERENTIAL/PLATELET
BASOS ABS: 0 10*3/uL (ref 0.0–0.1)
BASOS PCT: 0 %
Eosinophils Absolute: 0.3 10*3/uL (ref 0.0–0.5)
Eosinophils Relative: 6 %
HEMATOCRIT: 41.5 % (ref 39.0–52.0)
Hemoglobin: 13.2 g/dL (ref 13.0–17.0)
LYMPHS PCT: 26 %
Lymphs Abs: 1.2 10*3/uL (ref 0.7–4.0)
MCH: 28.9 pg (ref 26.0–34.0)
MCHC: 31.8 g/dL (ref 30.0–36.0)
MCV: 90.8 fL (ref 80.0–100.0)
Monocytes Absolute: 0.4 10*3/uL (ref 0.1–1.0)
Monocytes Relative: 8 %
NEUTROS ABS: 2.7 10*3/uL (ref 1.7–7.7)
NEUTROS PCT: 60 %
NRBC: 0 % (ref 0.0–0.2)
Platelets: 192 10*3/uL (ref 150–400)
RBC: 4.57 MIL/uL (ref 4.22–5.81)
RDW: 14.5 % (ref 11.5–15.5)
WBC: 4.5 10*3/uL (ref 4.0–10.5)

## 2018-08-23 LAB — PSA: Prostatic Specific Antigen: 0.29 ng/mL (ref 0.00–4.00)

## 2018-08-23 MED ORDER — DENOSUMAB 120 MG/1.7ML ~~LOC~~ SOLN
120.0000 mg | Freq: Once | SUBCUTANEOUS | Status: AC
  Administered 2018-08-23: 120 mg via SUBCUTANEOUS
  Filled 2018-08-23: qty 1.7

## 2018-08-23 NOTE — Patient Instructions (Signed)
[facility] at [facility] Discharge Instructions  Follow up in 2 months with labs   Thank you for choosing [facility] at [facility] to provide your oncology and hematology care.  To afford each patient quality time with our provider, please arrive at least 15 minutes before your scheduled appointment time.   If you have a lab appointment with the Cancer Center please come in thru the  Main Entrance and check in at the main information desk  You need to re-schedule your appointment should you arrive 10 or more minutes late.  We strive to give you quality time with our providers, and arriving late affects you and other patients whose appointments are after yours.  Also, if you no show three or more times for appointments you may be dismissed from the clinic at the providers discretion.     Again, thank you for choosing [facility].  Our hope is that these requests will decrease the amount of time that you wait before being seen by our physicians.       _____________________________________________________________  Should you have questions after your visit to [facility], please contact our office at [phone] between the hours of 8:00 a.m. and 4:30 p.m.  Voicemails left after 4:00 p.m. will not be returned until the following business day.  For prescription refill requests, have your pharmacy contact our office and allow 72 hours.    Cancer Center Support Programs:   > Cancer Support Group  2nd Tuesday of the month 1pm-2pm, Journey Room    

## 2018-08-23 NOTE — Progress Notes (Signed)
Pt given XGeva injection today. Pt tolerated well. No complaints noted. Pt off unit ambulatory. F/U appointments instructed and pt verbalized understanding of F/U.

## 2018-08-23 NOTE — Assessment & Plan Note (Addendum)
1.  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 -Enzalutamide 80 mg daily started on 02/08/2018, increased to 3 tablets daily, increased to full dose of 4 tablets daily in the first week of June. - He is tolerating enzalutamide very well.  No severe fatigue was reported.  His last PSA in July was down to 0.3.  PSA from today's pending. -His only complaint was increased frequency of urination at least one time every hour starting midnight.  This is new for the last 3 to 4 weeks.  I have asked him to talk to [name] about it.  I will see him back in 2 months for follow-up. - He is receiving Lupron injection every 4 months at Dr. Jethro Poling office.  2.  Bone metastasis: He has 1 metastatic site on fluciclovine PET CT scan on the right ilium. - Xgeva was started on 05/02/2018, and he is tolerating it well. - He was counseled to take calcium and vitamin D on a regular basis.

## 2018-09-09 ENCOUNTER — Other Ambulatory Visit (HOSPITAL_COMMUNITY): Payer: Self-pay | Admitting: *Deleted

## 2018-09-09 DIAGNOSIS — C61 Malignant neoplasm of prostate: Secondary | ICD-10-CM

## 2018-09-09 MED ORDER — ENZALUTAMIDE 40 MG PO CAPS: 160.0000 mg | ORAL_CAPSULE | Freq: Every day | ORAL | 3 refills | Status: DC

## 2018-09-20 ENCOUNTER — Inpatient Hospital Stay (HOSPITAL_COMMUNITY): Payer: Medicare Other

## 2018-09-20 ENCOUNTER — Inpatient Hospital Stay (HOSPITAL_COMMUNITY): Payer: Medicare Other | Attending: Hematology

## 2018-09-20 ENCOUNTER — Encounter (HOSPITAL_COMMUNITY): Payer: Self-pay

## 2018-09-20 VITALS — BP 170/83 | HR 60 | Temp 97.7°F | Resp 18

## 2018-09-20 DIAGNOSIS — C7951 Secondary malignant neoplasm of bone: Secondary | ICD-10-CM

## 2018-09-20 DIAGNOSIS — C61 Malignant neoplasm of prostate: Secondary | ICD-10-CM

## 2018-09-20 LAB — CBC WITH DIFFERENTIAL/PLATELET
ABS IMMATURE GRANULOCYTES: 0.02 10*3/uL (ref 0.00–0.07)
BASOS PCT: 1 %
Basophils Absolute: 0 10*3/uL (ref 0.0–0.1)
Eosinophils Absolute: 0.2 10*3/uL (ref 0.0–0.5)
Eosinophils Relative: 4 %
HCT: 40.6 % (ref 39.0–52.0)
Hemoglobin: 12.4 g/dL — ABNORMAL LOW (ref 13.0–17.0)
Immature Granulocytes: 0 %
Lymphocytes Relative: 24 %
Lymphs Abs: 1.3 10*3/uL (ref 0.7–4.0)
MCH: 28.4 pg (ref 26.0–34.0)
MCHC: 30.5 g/dL (ref 30.0–36.0)
MCV: 92.9 fL (ref 80.0–100.0)
MONO ABS: 0.4 10*3/uL (ref 0.1–1.0)
Monocytes Relative: 8 %
NEUTROS PCT: 63 %
NRBC: 0 % (ref 0.0–0.2)
Neutro Abs: 3.3 10*3/uL (ref 1.7–7.7)
PLATELETS: 187 10*3/uL (ref 150–400)
RBC: 4.37 MIL/uL (ref 4.22–5.81)
RDW: 14.3 % (ref 11.5–15.5)
WBC: 5.2 10*3/uL (ref 4.0–10.5)

## 2018-09-20 LAB — COMPREHENSIVE METABOLIC PANEL
ALT: 16 U/L (ref 0–44)
ANION GAP: 5 (ref 5–15)
AST: 16 U/L (ref 15–41)
Albumin: 4 g/dL (ref 3.5–5.0)
Alkaline Phosphatase: 30 U/L — ABNORMAL LOW (ref 38–126)
BUN: 20 mg/dL (ref 8–23)
CHLORIDE: 110 mmol/L (ref 98–111)
CO2: 25 mmol/L (ref 22–32)
CREATININE: 0.78 mg/dL (ref 0.61–1.24)
Calcium: 8.9 mg/dL (ref 8.9–10.3)
GFR calc Af Amer: >60 mL/min (ref >60)
GFR calc non Af Amer: >60 mL/min (ref >60)
Glucose, Bld: 110 mg/dL — ABNORMAL HIGH (ref 70–99)
Potassium: 4.4 mmol/L (ref 3.5–5.1)
SODIUM: 140 mmol/L (ref 135–145)
Total Bilirubin: 0.9 mg/dL (ref 0.3–1.2)
Total Protein: 6.8 g/dL (ref 6.5–8.1)

## 2018-09-20 MED ORDER — DENOSUMAB 120 MG/1.7ML ~~LOC~~ SOLN
120.0000 mg | Freq: Once | SUBCUTANEOUS | Status: AC
  Administered 2018-09-20: 120 mg via SUBCUTANEOUS
  Filled 2018-09-20: qty 1.7

## 2018-09-20 NOTE — Progress Notes (Signed)
Elijah Santana tolerated Xgeva injection well without complaints or incident. Calcium 8.9 today and pt denies any tooth or jaw pain and no recent or future dental visits. Pt continues to take Calcium PO as prescribed without issues. VSS Pt discharged self ambulatory in satisfactory condition

## 2018-09-20 NOTE — Patient Instructions (Signed)
[facility] at [facility] Discharge Instructions  Received Xgeva injection today. Follow-up as scheduled. Call clinic for any questions or concerns   Thank you for choosing  [facility] to provide your oncology and hematology care.  To afford each patient quality time with our provider, please arrive at least 15 minutes before your scheduled appointment time.   If you have a lab appointment with the Cancer Center please come in thru the  Main Entrance and check in at the main information desk  You need to re-schedule your appointment should you arrive 10 or more minutes late.  We strive to give you quality time with our providers, and arriving late affects you and other patients whose appointments are after yours.  Also, if you no show three or more times for appointments you may be dismissed from the clinic at the providers discretion.     Again, thank you for choosing [facility].  Our hope is that these requests will decrease the amount of time that you wait before being seen by our physicians.       _____________________________________________________________  Should you have questions after your visit to  Cancer Center, please contact our office at [phone] between the hours of 8:00 a.m. and 4:30 p.m.  Voicemails left after 4:00 p.m. will not be returned until the following business day.  For prescription refill requests, have your pharmacy contact our office and allow 72 hours.    Cancer Center Support Programs:   > Cancer Support Group  2nd Tuesday of the month 1pm-2pm, Journey Room   

## 2018-10-10 ENCOUNTER — Ambulatory Visit: Payer: Medicare Other | Admitting: Cardiology

## 2018-10-10 ENCOUNTER — Encounter: Payer: Self-pay | Admitting: Cardiology

## 2018-10-10 VITALS — BP 140/78 | HR 73 | Ht 68.0 in | Wt 219.0 lb

## 2018-10-10 DIAGNOSIS — E782 Mixed hyperlipidemia: Secondary | ICD-10-CM

## 2018-10-10 DIAGNOSIS — I25119 Atherosclerotic heart disease of native coronary artery with unspecified angina pectoris: Secondary | ICD-10-CM

## 2018-10-10 DIAGNOSIS — I10 Essential (primary) hypertension: Secondary | ICD-10-CM

## 2018-10-10 DIAGNOSIS — I480 Paroxysmal atrial fibrillation: Secondary | ICD-10-CM | POA: Diagnosis not present

## 2018-10-10 MED ORDER — APIXABAN 5 MG PO TABS: ORAL_TABLET | ORAL | 0 refills | Status: DC

## 2018-10-10 NOTE — Patient Instructions (Addendum)

## 2018-10-10 NOTE — Progress Notes (Signed)
Cardiology Office Note  Date: 10/10/2018   ID: Elijah Santana, Elijah Santana 11-May-1932, MRN [id number]  PCP: Glenda Chroman, MD  Primary Cardiologist: Rozann Lesches, MD   Chief Complaint  Patient presents with  . Coronary Artery Disease    History of Present Illness: Elijah Santana is an 84 y.o. male last seen in March.  He presents for a routine visit.  Reports occasional angina symptoms, not progressive.  He has forgotten to take his morning medications from time to time.  He has been very busy, primary caregiver for his wife with failing health.  Recent lab work is outlined below.  He does not report any bleeding problems on Eliquis and Plavix.  I personally reviewed his ECG today which shows normal sinus rhythm.  Past Medical History:  Diagnosis Date  . Arthritis   . Asthma    Childhood  . Chronic back pain   . Coronary atherosclerosis of native coronary artery    a. CABG 2004 with LIMA-LAD, SVG-D1, SVG-OM, SVG-PDA. b. Cath ~2010 with occ of SVG-diagonal, distal LAD and OM filiing by collaterals. c. NSTEMI 12/2014 s/p DES to SVG-RCA. d. 11/2015: DES to distal graft of the SVG-distal RCA  . Essential hypertension   . Foley catheter in place   . GERD (gastroesophageal reflux disease)   . Hard of hearing   . Hyperglycemia   . Ischemic cardiomyopathy    a. EF 45% by cath 12/2014.  . Mixed hyperlipidemia   . NSTEMI (non-ST elevated myocardial infarction) ([facility]) 01/01/15  . Pneumonia    hx of   . Prostate cancer [facility])    Radiation therapy 1996  . Skin cancer   . Stroke [facility])    TIA- 28 years ago     Past Surgical History:  Procedure Laterality Date  . CARDIAC CATHETERIZATION  01/01/2015   Procedure: CORONARY STENT INTERVENTION;  Surgeon: Peter M Martinique, MD;  Location: [facility] CATH LAB;  Service: Cardiovascular;;  SVG to PDA  . CARDIAC CATHETERIZATION N/A 11/21/2015   Procedure: Left Heart Cath and Cors/Grafts Angiography;  Surgeon: Troy Sine, MD;  Location: [place] CV LAB;   Service: Cardiovascular;  Laterality: N/A;  . CARDIAC CATHETERIZATION N/A 11/21/2015   Procedure: Coronary Stent Intervention;  Surgeon: Troy Sine, MD;  Location: [place] CV LAB;  Service: Cardiovascular;  Laterality: N/A;  . CARDIAC CATHETERIZATION N/A 09/11/2016   Procedure: Left Heart Cath and Cors/Grafts Angiography;  Surgeon: Leonie Man, MD;  Location: [place] CV LAB;  Service: Cardiovascular;  Laterality: N/A;  . CARDIAC CATHETERIZATION N/A 09/11/2016   Procedure: Coronary Stent Intervention;  Surgeon: Leonie Man, MD;  Location: [place] CV LAB;  Service: Cardiovascular;  Laterality: N/A;  . CORONARY ARTERY BYPASS GRAFT  2004   LIMA to LAD, SVG to diagonal, SVG to circumflex, SVG to PDA  . GREEN LIGHT LASER TURP (TRANSURETHRAL RESECTION OF PROSTATE N/A 06/04/2016   Procedure: GREEN LIGHT LASER TURP (TRANSURETHRAL RESECTION OF PROSTATE;  Surgeon: Irine Seal, MD;  Location: [facility] ORS;  Service: Urology;  Laterality: N/A;  . LEFT HEART CATHETERIZATION WITH CORONARY/GRAFT ANGIOGRAM N/A 01/01/2015   Procedure: LEFT HEART CATHETERIZATION WITH Beatrix Fetters;  Surgeon: Peter M Martinique, MD;  Location: [facility] CATH LAB;  Service: Cardiovascular;  Laterality: N/A;  . PERCUTANEOUS CORONARY STENT INTERVENTION (PCI-S)  11/20/2015   distal SVG  with DES       . TONSILLECTOMY      Current Outpatient Medications  Medication Sig Dispense  Refill  . acetaminophen (TYLENOL) 325 MG tablet Take 2 tablets (650 mg total) by mouth every 4 (four) hours as needed for headache or mild pain.    Marland Kitchen amLODipine (NORVASC) 5 MG tablet TAKE ONE TABLET BY MOUTH DAILY (MORNING) 30 tablet 3  . apixaban (ELIQUIS) 5 MG TABS tablet TAKE ONE TABLET BY MOUTH TWICE DAILY (MORNING ,EVENING) 28 tablet 0  . bisacodyl (DULCOLAX) 5 MG EC tablet Take 5-10 mg by mouth daily as needed (constipation). Reported on 05/28/2016    . clopidogrel (PLAVIX) 75 MG tablet TAKE ONE TABLET BY MOUTH EVERY DAY (,EVENING) 30 tablet  6  . doxazosin (CARDURA) 4 MG tablet TAKE ONE TABLET BY MOUTH DAILY. (,EVENING) 90 tablet 1  . enzalutamide (XTANDI) 40 MG capsule Take 4 capsules (160 mg total) by mouth daily. 120 capsule 3  . isosorbide mononitrate (IMDUR) 60 MG 24 hr tablet TAKE ONE TABLET BY MOUTH DAILY (MORNING) 30 tablet 6  . metoprolol tartrate (LOPRESSOR) 50 MG tablet TAKE ONE TABLET BY MOUTH TWICE DAILY (MORNING ,EVENING) 60 tablet 6  . nitroGLYCERIN (NITROSTAT) 0.4 MG SL tablet PLACE ONE (1) TABLET UNDER TONGUE EVERY 5 MINUTES UP TO (3) DOSES AS NEEDED FOR CHEST PAIN. 25 tablet 3  . pantoprazole (PROTONIX) 40 MG tablet Take 1 tablet (40 mg total) by mouth daily. 90 tablet 3  . PRESCRIPTION MEDICATION Hormone shots done at Dr. Ralene Muskrat office once every 4 months (for prostrate) - last injection mid December 2016    . rosuvastatin (CRESTOR) 20 MG tablet TAKE ONE TABLET BY MOUTH DAILY (EVENING) 30 tablet 6  . tamsulosin (FLOMAX) 0.4 MG CAPS capsule TAKE ONE CAPSULE BY MOUTH TWICE DAILY. (MORNING ,EVENING)  11   No current facility-administered medications for this visit.    Allergies:  Patient has no known allergies.   Social History: The patient  reports that he quit smoking about 32 years ago. His smoking use included cigars. He started smoking about 54 years ago. He has a 13.50 pack-year smoking history. He quit smokeless tobacco use about 8 years ago.  His smokeless tobacco use included chew. He reports that he does not drink alcohol or use drugs.   ROS:  Please see the history of present illness. Otherwise, complete review of systems is positive for hearing loss and arthritic stiffness.  All other systems are reviewed and negative.   Physical Exam: VS:  BP 140/78   Pulse 73   Ht 5\' 8"  (1.727 m)   Wt 219 lb (99.3 kg)   SpO2 97%   BMI 33.30 kg/m , BMI Body mass index is 33.3 kg/m.  Wt Readings from Last 3 Encounters:  10/10/18 219 lb (99.3 kg)  08/23/18 217 lb (98.4 kg)  07/25/18 220 lb (99.8 kg)      General: Elderly male, appears comfortable at rest. HEENT: Conjunctiva and lids normal, oropharynx clear. Neck: Supple, no elevated JVP or carotid bruits, no thyromegaly. Lungs: Clear to auscultation, nonlabored breathing at rest. Cardiac: Regular rate and rhythm, no S3 or significant systolic murmur. Abdomen: Soft, nontender, bowel sounds present. Extremities: Mild ankle edema, distal pulses 2+. Skin: Warm and dry. Musculoskeletal: No kyphosis. Neuropsychiatric: Alert and oriented x3, affect grossly appropriate.  ECG: I personally reviewed the tracing from 08/12/2017 which showed sinus rhythm with PACs.  Recent Labwork: 09/20/2018: ALT 16; AST 16; BUN 20; Creatinine, Ser 0.78; Hemoglobin 12.4; Platelets 187; Potassium 4.4; Sodium 140     Component Value Date/Time   CHOL 167 09/11/2016 0434  TRIG 214 (H) 09/11/2016 0434   HDL 38 (L) 09/11/2016 0434   CHOLHDL 4.4 09/11/2016 0434   VLDL 43 (H) 09/11/2016 0434   LDLCALC 86 09/11/2016 0434    Other Studies Reviewed Today:  Cardiac catheterization 09/11/2016:  SVG-OM3: Culprit Tandem lesions - Mid Graft to Dist Graft lesion, 70 %stenosed. Dist Graft to Insertion lesion, 95 %stenosed.  A STENT PROMUS PREM MR [id number] drug eluting stent was successfully placed covering both lesions. Post intervention, there is a 0% residual stenosis.   Additional Angiography:  Ost LAD lesion, 99 %stenosed. Prox LAD to Mid LAD lesion, 100 %stenosed after small D1.  LIMA-LAD and is normal in caliber. The LAD is grafted in between 2 diagonal branches. Downstream below the D3, Dist LAD lesion, 100 %stenosed - fills via collaterals from the circumflex system.  Prox Cx to Mid Cx lesion, 100 %stenosed. Ramus lesion, 99 %stenosed. - No change from previous cath  Prox RCA lesion, 100 %stenosed. Mid RCA to Dist RCA lesion, 100 %stenosed. The vessels actually more occluded than previously noted  SVG-dRCA: Prox Graft to Mid Graft stent, ~15 %stenosed, Dist  Graft stent, 0 %stenosed.  SVG-Diag Origin lesion, 100 %stenosed.  The left ventricular systolic function is low normal. The left ventricular ejection fraction is 50-55% by visual estimate - there is a hint of possible mild anterolateral hypokinesis  LV end diastolic pressure is mildly elevated.  Successful PCI of tandem lesions in the SVG-OM 3. Otherwise angiographically similar coronary arteries and grafts compared to catheterization in January 2017.  Assessment and Plan:  1.  Multivessel CAD status post CABG with graft disease and multiple PCI's.  He has been recently stable on medical therapy with occasional angina symptoms.  ECG reviewed and stable.  Continue with current plan.  2.  Paroxysmal atrial fibrillation.  He reports no palpitations and remains on Eliquis for stroke prophylaxis.  Recent lab work reviewed above.  3.  Essential hypertension, no changes to current regimen.  4.  Mixed hyperlipidemia on Crestor.  Current medicines were reviewed with the patient today.   Orders Placed This Encounter  Procedures  . EKG 12-Lead    Disposition: Follow-up in 6 months.  Signed, Satira Sark, MD, [facility] 10/10/2018 1:56 PM    [facility] at [place], [place], [place] [address] Phone: [phone]; Fax: [phone]

## 2018-10-11 ENCOUNTER — Other Ambulatory Visit: Payer: Self-pay | Admitting: Cardiology

## 2018-10-17 ENCOUNTER — Inpatient Hospital Stay (HOSPITAL_COMMUNITY): Payer: Medicare Other | Attending: Hematology

## 2018-10-17 DIAGNOSIS — Z8546 Personal history of malignant neoplasm of prostate: Secondary | ICD-10-CM | POA: Diagnosis not present

## 2018-10-17 DIAGNOSIS — I252 Old myocardial infarction: Secondary | ICD-10-CM | POA: Diagnosis not present

## 2018-10-17 DIAGNOSIS — Z87891 Personal history of nicotine dependence: Secondary | ICD-10-CM | POA: Diagnosis not present

## 2018-10-17 DIAGNOSIS — Z923 Personal history of irradiation: Secondary | ICD-10-CM | POA: Insufficient documentation

## 2018-10-17 DIAGNOSIS — I10 Essential (primary) hypertension: Secondary | ICD-10-CM | POA: Insufficient documentation

## 2018-10-17 DIAGNOSIS — C7951 Secondary malignant neoplasm of bone: Secondary | ICD-10-CM | POA: Diagnosis not present

## 2018-10-17 DIAGNOSIS — Z8673 Personal history of transient ischemic attack (TIA), and cerebral infarction without residual deficits: Secondary | ICD-10-CM | POA: Diagnosis not present

## 2018-10-17 DIAGNOSIS — Z79899 Other long term (current) drug therapy: Secondary | ICD-10-CM | POA: Insufficient documentation

## 2018-10-17 DIAGNOSIS — Z85828 Personal history of other malignant neoplasm of skin: Secondary | ICD-10-CM | POA: Insufficient documentation

## 2018-10-17 DIAGNOSIS — C61 Malignant neoplasm of prostate: Secondary | ICD-10-CM | POA: Diagnosis present

## 2018-10-17 DIAGNOSIS — Z7901 Long term (current) use of anticoagulants: Secondary | ICD-10-CM | POA: Diagnosis not present

## 2018-10-17 LAB — COMPREHENSIVE METABOLIC PANEL
ALBUMIN: 4 g/dL (ref 3.5–5.0)
ALK PHOS: 33 U/L — AB (ref 38–126)
ALT: 16 U/L (ref 0–44)
AST: 16 U/L (ref 15–41)
Anion gap: 4 — ABNORMAL LOW (ref 5–15)
BUN: 15 mg/dL (ref 8–23)
CALCIUM: 8.4 mg/dL — AB (ref 8.9–10.3)
CO2: 26 mmol/L (ref 22–32)
CREATININE: 0.74 mg/dL (ref 0.61–1.24)
Chloride: 111 mmol/L (ref 98–111)
GFR calc Af Amer: >60 mL/min (ref >60)
GFR calc non Af Amer: >60 mL/min (ref >60)
GLUCOSE: 113 mg/dL — AB (ref 70–99)
Potassium: 4.4 mmol/L (ref 3.5–5.1)
SODIUM: 141 mmol/L (ref 135–145)
Total Bilirubin: 0.7 mg/dL (ref 0.3–1.2)
Total Protein: 7 g/dL (ref 6.5–8.1)

## 2018-10-17 LAB — CBC WITH DIFFERENTIAL/PLATELET
ABS IMMATURE GRANULOCYTES: 0.02 10*3/uL (ref 0.00–0.07)
Basophils Absolute: 0 10*3/uL (ref 0.0–0.1)
Basophils Relative: 1 %
EOS PCT: 3 %
Eosinophils Absolute: 0.2 10*3/uL (ref 0.0–0.5)
HEMATOCRIT: 40.2 % (ref 39.0–52.0)
HEMOGLOBIN: 12.5 g/dL — AB (ref 13.0–17.0)
Immature Granulocytes: 0 %
LYMPHS ABS: 1.1 10*3/uL (ref 0.7–4.0)
LYMPHS PCT: 19 %
MCH: 29.1 pg (ref 26.0–34.0)
MCHC: 31.1 g/dL (ref 30.0–36.0)
MCV: 93.5 fL (ref 80.0–100.0)
Monocytes Absolute: 0.5 10*3/uL (ref 0.1–1.0)
Monocytes Relative: 8 %
NEUTROS ABS: 4.1 10*3/uL (ref 1.7–7.7)
Neutrophils Relative %: 69 %
Platelets: 195 10*3/uL (ref 150–400)
RBC: 4.3 MIL/uL (ref 4.22–5.81)
RDW: 14.5 % (ref 11.5–15.5)
WBC: 6 10*3/uL (ref 4.0–10.5)
nRBC: 0 % (ref 0.0–0.2)

## 2018-10-17 LAB — PSA: Prostatic Specific Antigen: 0.27 ng/mL (ref 0.00–4.00)

## 2018-10-18 ENCOUNTER — Encounter (HOSPITAL_COMMUNITY): Payer: Self-pay | Admitting: Hematology

## 2018-10-18 ENCOUNTER — Inpatient Hospital Stay (HOSPITAL_COMMUNITY): Payer: Medicare Other

## 2018-10-18 ENCOUNTER — Other Ambulatory Visit: Payer: Self-pay

## 2018-10-18 ENCOUNTER — Inpatient Hospital Stay (HOSPITAL_BASED_OUTPATIENT_CLINIC_OR_DEPARTMENT_OTHER): Payer: Medicare Other | Admitting: Hematology

## 2018-10-18 ENCOUNTER — Encounter (HOSPITAL_COMMUNITY): Payer: Self-pay

## 2018-10-18 VITALS — BP 158/72 | HR 63 | Temp 97.8°F | Resp 18

## 2018-10-18 DIAGNOSIS — I10 Essential (primary) hypertension: Secondary | ICD-10-CM | POA: Diagnosis not present

## 2018-10-18 DIAGNOSIS — C61 Malignant neoplasm of prostate: Secondary | ICD-10-CM

## 2018-10-18 DIAGNOSIS — Z85828 Personal history of other malignant neoplasm of skin: Secondary | ICD-10-CM

## 2018-10-18 DIAGNOSIS — I252 Old myocardial infarction: Secondary | ICD-10-CM | POA: Diagnosis not present

## 2018-10-18 DIAGNOSIS — Z7901 Long term (current) use of anticoagulants: Secondary | ICD-10-CM

## 2018-10-18 DIAGNOSIS — C7951 Secondary malignant neoplasm of bone: Secondary | ICD-10-CM | POA: Diagnosis not present

## 2018-10-18 DIAGNOSIS — Z8673 Personal history of transient ischemic attack (TIA), and cerebral infarction without residual deficits: Secondary | ICD-10-CM

## 2018-10-18 DIAGNOSIS — Z79899 Other long term (current) drug therapy: Secondary | ICD-10-CM

## 2018-10-18 DIAGNOSIS — Z8546 Personal history of malignant neoplasm of prostate: Secondary | ICD-10-CM

## 2018-10-18 DIAGNOSIS — Z87891 Personal history of nicotine dependence: Secondary | ICD-10-CM

## 2018-10-18 DIAGNOSIS — Z923 Personal history of irradiation: Secondary | ICD-10-CM

## 2018-10-18 MED ORDER — DENOSUMAB 120 MG/1.7ML ~~LOC~~ SOLN
120.0000 mg | Freq: Once | SUBCUTANEOUS | Status: AC
  Administered 2018-10-18: 120 mg via SUBCUTANEOUS
  Filled 2018-10-18: qty 1.7

## 2018-10-18 NOTE — Assessment & Plan Note (Signed)
1.  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 -Enzalutamide 80 mg daily started on 02/08/2018, increased to 3 tablets daily, increased to full dose of 4 tablets daily in the first week of June. - He is tolerating enzalutamide 120 mg daily very well. -He is continuing to receive Lupron injections at Dr. Jethro Poling office every 4 months. - We discussed about his blood work which shows PSA continuing to trend down at 0.27. -We will fill his paperwork for drug assistance program.  I will see him back in 2 months with repeat blood work and PSA.  2.  Bone metastasis: He has 1 metastatic site on fluciclovine PET CT scan on the right ilium. - Denosumab was started on 05/02/2018.  He forgets to take calcium on a regular basis.  - He was counseled to take calcium and vitamin D on a regular basis.

## 2018-10-18 NOTE — Progress Notes (Signed)
Elijah Santana presents today for injection per MD orders. XGEVA administered SQ in right Abdomen. Administration without incident. Patient tolerated well.

## 2018-10-18 NOTE — Progress Notes (Signed)
Patient notified by MD team that he needs to take his daily Calcium and Vitamin D. Patient said he knew but he keeps forgetting.

## 2018-10-18 NOTE — Patient Instructions (Signed)
[place] at [facility] Discharge Instructions  Follow up in 2 moths with labs    Thank you for choosing [place] at [facility] to provide your oncology and hematology care.  To afford each patient quality time with our provider, please arrive at least 15 minutes before your scheduled appointment time.   If you have a lab appointment with the [facility] please come in thru the  Main Entrance and check in at the main information desk  You need to re-schedule your appointment should you arrive 10 or more minutes late.  We strive to give you quality time with our providers, and arriving late affects you and other patients whose appointments are after yours.  Also, if you no show three or more times for appointments you may be dismissed from the clinic at the providers discretion.     Again, thank you for choosing [facility].  Our hope is that these requests will decrease the amount of time that you wait before being seen by our physicians.       _____________________________________________________________  Should you have questions after your visit to [facility], please contact our office at [phone] between the hours of 8:00 a.m. and 4:30 p.m.  Voicemails left after 4:00 p.m. will not be returned until the following business day.  For prescription refill requests, have your pharmacy contact our office and allow 72 hours.    Cancer Center Support Programs:   > Cancer Support Group  2nd Tuesday of the month 1pm-2pm, Journey Room

## 2018-10-18 NOTE — Progress Notes (Signed)
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 male returns for routine follow-up for prostate cancer. He is here today and he is tolerating the medication well. He has a small red raised bump on his right hand. He states he thinks he was bit. He denies any pain or fevers from this. He does get SOB with exertion and it resolves quickly with rest.He denies any nausea, vomiting, or diarrhea. Denies any new pains. Denies any trouble urinating or hemturia. Denies any bleeding or east bruising. He reports his appetite at 100% and his energy level at 50%. He has no problem maintaining his weight at this time. He is trying to remain active at home and her performs all his own ADLs and activities. He forgets to take his calcium on a daily basis.     REVIEW OF SYSTEMS:  Review of Systems  Respiratory: Positive for shortness of breath.   Gastrointestinal: Positive for constipation.  All other systems reviewed and are negative.    PAST MEDICAL/SURGICAL HISTORY:  Past Medical History:  Diagnosis Date  . Arthritis   . Asthma    Childhood  . Chronic back pain   . Coronary atherosclerosis of native coronary artery    a. CABG 2004 with LIMA-LAD, SVG-D1, SVG-OM, SVG-PDA. b. Cath ~2010 with occ of SVG-diagonal, distal LAD and OM filiing by collaterals. c. NSTEMI 12/2014 s/p DES to SVG-RCA. d. 11/2015: DES to distal graft of the SVG-distal RCA  . Essential hypertension   . Foley catheter in place   . GERD (gastroesophageal reflux disease)   . Hard of hearing   .  Hyperglycemia   . Ischemic cardiomyopathy    a. EF 45% by cath 12/2014.  . Mixed hyperlipidemia   . NSTEMI (non-ST elevated myocardial infarction) ([place]) 01/01/15  . Pneumonia    hx of   . Prostate cancer [facility])    Radiation therapy 1996  . Skin cancer   . Stroke [facility])    TIA- 28 years ago    Past Surgical History:  Procedure Laterality Date  . CARDIAC CATHETERIZATION  01/01/2015   Procedure: CORONARY STENT INTERVENTION;  Surgeon: Peter M Martinique, MD;  Location: [facility] CATH LAB;  Service: Cardiovascular;;  SVG to PDA  . CARDIAC CATHETERIZATION N/A 11/21/2015   Procedure: Left Heart Cath and Cors/Grafts Angiography;  Surgeon: Troy Sine, MD;  Location: [place] CV LAB;  Service: Cardiovascular;  Laterality: N/A;  . CARDIAC CATHETERIZATION N/A 11/21/2015   Procedure: Coronary Stent Intervention;  Surgeon: Troy Sine, MD;  Location: [place] CV LAB;  Service: Cardiovascular;  Laterality: N/A;  . CARDIAC CATHETERIZATION N/A 09/11/2016   Procedure: Left Heart Cath and Cors/Grafts Angiography;  Surgeon: Leonie Man, MD;  Location: [place] CV LAB;  Service: Cardiovascular;  Laterality: N/A;  . CARDIAC CATHETERIZATION N/A 09/11/2016   Procedure: Coronary Stent Intervention;  Surgeon: Leonie Man, MD;  Location: [place] CV LAB;  Service: Cardiovascular;  Laterality: N/A;  . CORONARY ARTERY BYPASS GRAFT  2004  LIMA to LAD, SVG to diagonal, SVG to circumflex, SVG to PDA  . GREEN LIGHT LASER TURP (TRANSURETHRAL RESECTION OF PROSTATE N/A 06/04/2016   Procedure: GREEN LIGHT LASER TURP (TRANSURETHRAL RESECTION OF PROSTATE;  Surgeon: Irine Seal, MD;  Location: [facility] ORS;  Service: Urology;  Laterality: N/A;  . LEFT HEART CATHETERIZATION WITH CORONARY/GRAFT ANGIOGRAM N/A 01/01/2015   Procedure: LEFT HEART CATHETERIZATION WITH Beatrix Fetters;  Surgeon: Peter M Martinique, MD;  Location: [facility] CATH LAB;  Service: Cardiovascular;  Laterality: N/A;  . PERCUTANEOUS CORONARY STENT INTERVENTION  (PCI-S)  11/20/2015   distal SVG  with DES       . TONSILLECTOMY       SOCIAL HISTORY:  Social History   Socioeconomic History  . Marital status: Married    Spouse name: Not on file  . Number of children: Not on file  . Years of education: Not on file  . Highest education level: Not on file  Occupational History  . Occupation: Retired    Comment: Office manager  Social Needs  . Financial resource strain: Not on file  . Food insecurity:    Worry: Not on file    Inability: Not on file  . Transportation needs:    Medical: Not on file    Non-medical: Not on file  Tobacco Use  . Smoking status: Former Smoker    Packs/day: 0.50    Years: 27.00    Pack years: 13.50    Types: Cigars    Start date: 07/28/1964    Last attempt to quit: 07/28/1986    Years since quitting: 32.2  . Smokeless tobacco: Former Systems developer    Types: Chew    Quit date: 08/07/2010  . Tobacco comment: never chewed up over a pack/day  Substance and Sexual Activity  . Alcohol use: No    Alcohol/week: 0.0 standard drinks  . Drug use: No  . Sexual activity: Not on file  Lifestyle  . Physical activity:    Days per week: Not on file    Minutes per session: Not on file  . Stress: Not on file  Relationships  . Social connections:    Talks on phone: Not on file    Gets together: Not on file    Attends religious service: Not on file    Active member of club or organization: Not on file    Attends meetings of clubs or organizations: Not on file    Relationship status: Not on file  . Intimate partner violence:    Fear of current or ex partner: Not on file    Emotionally abused: Not on file    Physically abused: Not on file    Forced sexual activity: Not on file  Other Topics Concern  . Not on file  Social History Narrative  . Not on file    FAMILY HISTORY:  Family History  Problem Relation Age of Onset  . Hypertension Father   . Coronary artery disease Father     CURRENT MEDICATIONS:    Outpatient Encounter Medications as of 10/18/2018  Medication Sig Note  . acetaminophen (TYLENOL) 325 MG tablet Take 2 tablets (650 mg total) by mouth every 4 (four) hours as needed for headache or mild pain.   Marland Kitchen amLODipine (NORVASC) 5 MG tablet TAKE ONE TABLET BY MOUTH DAILY (MORNING)   . apixaban (ELIQUIS) 5 MG TABS tablet TAKE ONE TABLET BY MOUTH TWICE DAILY (MORNING ,EVENING)   . bisacodyl (DULCOLAX) 5 MG EC tablet Take 5-10 mg  by mouth daily as needed (constipation). Reported on 05/28/2016   . clopidogrel (PLAVIX) 75 MG tablet TAKE ONE TABLET BY MOUTH EVERY DAY (,EVENING)   . doxazosin (CARDURA) 4 MG tablet TAKE ONE TABLET BY MOUTH DAILY. (,EVENING)   . enzalutamide (XTANDI) 40 MG capsule Take 4 capsules (160 mg total) by mouth daily.   . isosorbide mononitrate (IMDUR) 60 MG 24 hr tablet TAKE ONE TABLET BY MOUTH DAILY (MORNING)   . metoprolol tartrate (LOPRESSOR) 50 MG tablet TAKE ONE TABLET BY MOUTH TWICE DAILY (MORNING ,EVENING)   . nitroGLYCERIN (NITROSTAT) 0.4 MG SL tablet PLACE ONE (1) TABLET UNDER TONGUE EVERY 5 MINUTES UP TO (3) DOSES AS NEEDED FOR CHEST PAIN.   Marland Kitchen pantoprazole (PROTONIX) 40 MG tablet Take 1 tablet (40 mg total) by mouth daily.   Marland Kitchen PRESCRIPTION MEDICATION Hormone shots done at Dr. Ralene Muskrat office once every 4 months (for prostrate) - last injection mid December 2016 10/18/2018: Lupron every 4 months  . rosuvastatin (CRESTOR) 20 MG tablet TAKE ONE TABLET BY MOUTH DAILY (EVENING)   . tamsulosin (FLOMAX) 0.4 MG CAPS capsule TAKE ONE CAPSULE BY MOUTH TWICE DAILY. (MORNING ,EVENING)   . [EXPIRED] denosumab (XGEVA) injection 120 mg     No facility-administered encounter medications on file as of 10/18/2018.     ALLERGIES:  No Known Allergies   PHYSICAL EXAM:  ECOG Performance status: 1  There were no vitals filed for this visit. Filed Weights   10/18/18 1204  Weight: 218 lb 4.8 oz (99 kg)    Physical Exam  Constitutional: He is oriented to person, place, and  time. He appears well-developed and well-nourished.  Musculoskeletal: Normal range of motion.  Neurological: He is alert and oriented to person, place, and time.  Skin: Skin is warm and dry.  Psychiatric: He has a normal mood and affect. His behavior is normal. Judgment and thought content normal.     LABORATORY DATA:  I have reviewed the labs as listed.  CBC    Component Value Date/Time   WBC 6.0 10/17/2018 1021   RBC 4.30 10/17/2018 1021   HGB 12.5 (L) 10/17/2018 1021   HCT 40.2 10/17/2018 1021   PLT 195 10/17/2018 1021   MCV 93.5 10/17/2018 1021   MCH 29.1 10/17/2018 1021   MCHC 31.1 10/17/2018 1021   RDW 14.5 10/17/2018 1021   LYMPHSABS 1.1 10/17/2018 1021   MONOABS 0.5 10/17/2018 1021   EOSABS 0.2 10/17/2018 1021   BASOSABS 0.0 10/17/2018 1021   CMP Latest Ref Rng & Units 10/17/2018 09/20/2018 08/23/2018  Glucose 70 - 99 mg/dL 113(H) 110(H) 108(H)  BUN 8 - 23 mg/dL 15 20 14   Creatinine 0.61 - 1.24 mg/dL 0.74 0.78 0.75  Sodium 135 - 145 mmol/L 141 140 141  Potassium 3.5 - 5.1 mmol/L 4.4 4.4 4.0  Chloride 98 - 111 mmol/L 111 110 108  CO2 22 - 32 mmol/L 26 25 26   Calcium 8.9 - 10.3 mg/dL 8.4(L) 8.9 9.1  Total Protein 6.5 - 8.1 g/dL 7.0 6.8 6.9  Total Bilirubin 0.3 - 1.2 mg/dL 0.7 0.9 1.1  Alkaline Phos 38 - 126 U/L 33(L) 30(L) 35(L)  AST 15 - 41 U/L 16 16 17   ALT 0 - 44 U/L 16 16 16      I have reviewed Francene Finders, NP's note and agree with the documentation.  I personally performed a face-to-face visit, made revisions and my assessment and plan is as follows.      ASSESSMENT & PLAN:  Prostate cancer ([place]) 1.  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 -Enzalutamide 80 mg daily started on 02/08/2018, increased to 3 tablets daily, increased to full dose of 4 tablets daily in the first week of June. - He is tolerating enzalutamide 120 mg daily very well. -He is continuing to receive Lupron injections at Dr. Jethro Poling office every 4 months. - We discussed about his blood work which shows PSA continuing to trend down at 0.27. -We will fill his paperwork for drug assistance program.  I will see him back in 2 months with repeat blood work and PSA.  2.  Bone metastasis: He has 1 metastatic site on fluciclovine PET CT scan on the right ilium. - Denosumab was started on 05/02/2018.  He forgets to take calcium on a regular basis.  - He was counseled to take calcium and vitamin D on a regular basis.      Orders placed this encounter:  Orders Placed This Encounter  Procedures  . PSA  . CBC with Differential/Platelet  . Comprehensive metabolic panel      Derek Jack, MD [place] [phone]

## 2018-11-04 ENCOUNTER — Ambulatory Visit: Payer: Medicare Other | Admitting: Urology

## 2018-11-04 DIAGNOSIS — Z192 Hormone resistant malignancy status: Secondary | ICD-10-CM

## 2018-11-04 DIAGNOSIS — C61 Malignant neoplasm of prostate: Secondary | ICD-10-CM

## 2018-11-04 DIAGNOSIS — N401 Enlarged prostate with lower urinary tract symptoms: Secondary | ICD-10-CM

## 2018-11-04 DIAGNOSIS — R3914 Feeling of incomplete bladder emptying: Secondary | ICD-10-CM

## 2018-11-14 ENCOUNTER — Other Ambulatory Visit (HOSPITAL_COMMUNITY): Payer: Medicare Other

## 2018-11-15 ENCOUNTER — Telehealth (HOSPITAL_COMMUNITY): Payer: Self-pay | Admitting: Hematology

## 2018-11-15 ENCOUNTER — Inpatient Hospital Stay (HOSPITAL_COMMUNITY): Payer: Medicare Other

## 2018-11-15 ENCOUNTER — Encounter (HOSPITAL_COMMUNITY): Payer: Self-pay

## 2018-11-15 VITALS — BP 145/74 | HR 65 | Temp 97.6°F | Resp 18

## 2018-11-15 DIAGNOSIS — C7951 Secondary malignant neoplasm of bone: Secondary | ICD-10-CM

## 2018-11-15 DIAGNOSIS — C61 Malignant neoplasm of prostate: Secondary | ICD-10-CM

## 2018-11-15 LAB — COMPREHENSIVE METABOLIC PANEL
ALK PHOS: 28 U/L — AB (ref 38–126)
ALT: 17 U/L (ref 0–44)
AST: 16 U/L (ref 15–41)
Albumin: 4 g/dL (ref 3.5–5.0)
Anion gap: 7 (ref 5–15)
BUN: 21 mg/dL (ref 8–23)
CO2: 25 mmol/L (ref 22–32)
Calcium: 9.4 mg/dL (ref 8.9–10.3)
Chloride: 110 mmol/L (ref 98–111)
Creatinine, Ser: 0.87 mg/dL (ref 0.61–1.24)
GFR calc Af Amer: >60 mL/min (ref >60)
GFR calc non Af Amer: >60 mL/min (ref >60)
GLUCOSE: 110 mg/dL — AB (ref 70–99)
Potassium: 4.5 mmol/L (ref 3.5–5.1)
Sodium: 142 mmol/L (ref 135–145)
Total Bilirubin: 0.9 mg/dL (ref 0.3–1.2)
Total Protein: 6.7 g/dL (ref 6.5–8.1)

## 2018-11-15 MED ORDER — DENOSUMAB 120 MG/1.7ML ~~LOC~~ SOLN
120.0000 mg | Freq: Once | SUBCUTANEOUS | Status: AC
  Administered 2018-11-15: 120 mg via SUBCUTANEOUS
  Filled 2018-11-15: qty 1.7

## 2018-11-15 NOTE — Patient Instructions (Signed)
[facility]  Discharge Instructions:   _______________________________________________________________  Thank you for choosing [facility] at [facility] to provide your oncology and hematology care.  To afford each patient quality time with our providers, please arrive at least 15 minutes before your scheduled appointment.  You need to re-schedule your appointment if you arrive 10 or more minutes late.  We strive to give you quality time with our providers, and arriving late affects you and other patients whose appointments are after yours.  Also, if you no show three or more times for appointments you may be dismissed from the clinic.  Again, thank you for choosing [facility] at [facility]. Our hope is that these requests will allow you access to exceptional care and in a timely manner. _______________________________________________________________  If you have questions after your visit, please contact our office at [phone] between the hours of 8:30 a.m. and 5:00 p.m. Voicemails left after 4:30 p.m. will not be returned until the following business day. _______________________________________________________________  For prescription refill requests, have your pharmacy contact our office. _______________________________________________________________  Recommendations made by the consultant and any test results will be sent to your referring physician. _______________________________________________________________ 

## 2018-11-15 NOTE — Progress Notes (Signed)
Pt presents today for Xgeva injection. VSS. MAR updated and reviewed. Calcium supplement taken at home per pt. Pt requested to bring bottle in next visit. Labs WNL.   Vaughan Basta presents today for injection per MD orders. XGeva  administered SQ in left Upper Arm. Administration without incident. Patient tolerated well.   No complaints at this time. Discharged from clinic ambulatory. F/U with [facility] as scheduled.

## 2018-11-15 NOTE — Progress Notes (Signed)
Xtandi authorization request per patient. Patient concerned that upcoming year the medicine would not be authorized. Sent request to Jene Every RN, and spoke with [employer].   Pt states he takes Xtandi without any complications. No missed doses noted.

## 2018-11-17 ENCOUNTER — Other Ambulatory Visit: Payer: Self-pay | Admitting: Cardiology

## 2018-11-18 ENCOUNTER — Telehealth (HOSPITAL_COMMUNITY): Payer: Self-pay | Admitting: Hematology

## 2018-11-18 ENCOUNTER — Telehealth (HOSPITAL_COMMUNITY): Payer: Self-pay | Admitting: Pharmacist

## 2018-11-18 ENCOUNTER — Other Ambulatory Visit (HOSPITAL_COMMUNITY): Payer: Self-pay | Admitting: *Deleted

## 2018-11-18 DIAGNOSIS — C61 Malignant neoplasm of prostate: Secondary | ICD-10-CM

## 2018-11-18 MED ORDER — ENZALUTAMIDE 40 MG PO CAPS: 160.0000 mg | ORAL_CAPSULE | Freq: Every day | ORAL | 3 refills | Status: DC

## 2018-11-18 MED FILL — XTANDI 40 MG CAPSULE: 40 | 30 days supply | Qty: 120 | Fill #0

## 2018-11-18 NOTE — Telephone Encounter (Signed)
Oral Chemotherapy Pharmacist Encounter    Spoke with Elijah Santana to review dosing, administration, and side effects for Xtandi. Patient reported no new side effects or concerns since they were last seen in the office.   Darl Pikes, PharmD, BCPS, [facility] Hematology/Oncology Clinical Pharmacist [facility] [phone]  11/18/2018 3:04 PM

## 2018-11-18 NOTE — Telephone Encounter (Signed)
[facility] PT ASSIST IS REQUIRING THE PT TO APPLY FOR ASSIST WITH THE [facility] SINCE THEY HAVE FUNDS AVAILABLE. REFERRED PT TO [facility] WITH JUDY TO MAKE HER AWARE A NEW SCRIPT WOULD BE COMING THEIR WAY.

## 2018-11-18 NOTE — Telephone Encounter (Signed)
Oral Oncology Pharmacist Encounter   Patient is switching from manufacturer assistance to filling at [facility]. 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 Medication started 01/2018.  Prescription dose and frequency assessed.    Current medication list in Epic reviewed, several DDIs with Xtandi identified: - Eliquis (apixaban): Xtandi may decrease the serum concentration of apixaban, category X interaction. Mr. Essner is on apixaban for atrial fibrillation stroke prophylaxis. The recommendation is to avoid concurrent use when possible. If a switch in DOACS is desired, DOACs such as dabigatran or edoxaban do not interact with Gillermina Phy.  Gillermina Phy may may decrease the serum concentration of amlodipine, doxazosin, isosorbide, pantoprazole, tamsulosin. Monitor patient for decreased effectiveness of the listed medications. No baseline dose adjustment needed.    Oral Oncology Clinic will continue to follow patient.   Darl Pikes, PharmD, BCPS, [facility] Hematology/Oncology Clinical Pharmacist [facility] [phone]  11/18/2018 11:45 AM

## 2018-11-21 ENCOUNTER — Telehealth (HOSPITAL_COMMUNITY): Payer: Self-pay | Admitting: Pharmacy Technician

## 2018-11-21 NOTE — Telephone Encounter (Signed)
Oral Oncology Patient Advocate Encounter   Was successful in securing patient a grant from [employer] to provide copayment coverage for [place].  This will keep the out of pocket expense at $0.     I have spoken with the patient.    The billing information is as follows and has been shared with [place].   Member ID: [id number] Group ID: [id number] RxBin: [id number] Dates of Eligibility: 11/16/2018 through 11/16/2019  [place] Patient [place] Phone [phone] Fax [phone] 11/21/2018 4:19 PM

## 2018-11-30 ENCOUNTER — Telehealth (HOSPITAL_COMMUNITY): Payer: Self-pay | Admitting: *Deleted

## 2018-11-30 MED ORDER — TRAMADOL HCL 50 MG PO TABS: 25.0000 mg | ORAL_TABLET | Freq: Every day | ORAL | 0 refills | Status: DC

## 2018-11-30 NOTE — Telephone Encounter (Signed)
Patient called regarding lower back pain that the tylenol 1000mg  is not helping much.patient also complaining of constipation , pt states "I take miralax one day and stool softener the other. LBM was 3 days ago. Patient states that he usually has a BM on the 4th or 5th day if he does this regimen. He did not want to try anything new for constipation at this time.  [name] notified and will send in script for ultram per orders. Per MD, continue regimen for constipation. Patient understood. Script sent to [facility] in [place].

## 2018-12-01 ENCOUNTER — Other Ambulatory Visit (HOSPITAL_COMMUNITY): Payer: Self-pay | Admitting: Nurse Practitioner

## 2018-12-01 ENCOUNTER — Encounter (HOSPITAL_COMMUNITY): Payer: Self-pay

## 2018-12-01 ENCOUNTER — Other Ambulatory Visit (HOSPITAL_COMMUNITY): Payer: Self-pay | Admitting: *Deleted

## 2018-12-01 ENCOUNTER — Telehealth (HOSPITAL_COMMUNITY): Payer: Self-pay | Admitting: *Deleted

## 2018-12-01 DIAGNOSIS — C7951 Secondary malignant neoplasm of bone: Secondary | ICD-10-CM

## 2018-12-01 DIAGNOSIS — C61 Malignant neoplasm of prostate: Secondary | ICD-10-CM

## 2018-12-01 MED ORDER — TRAMADOL HCL 50 MG PO TABS: 25.0000 mg | ORAL_TABLET | Freq: Every day | ORAL | 0 refills | Status: DC | PRN

## 2018-12-01 NOTE — Telephone Encounter (Signed)
Pt aware that [name] would send in Rx for pain medication. Rx was sent yesterday but printed instead of being sent electronically. Pt aware and advised to check with his pharmacy later today.

## 2018-12-08 ENCOUNTER — Telehealth: Payer: Self-pay

## 2018-12-08 NOTE — Telephone Encounter (Signed)
Elijah Santana called the [facility] stating that someone from our office called him today. He stated that it could be in regards to a medication.

## 2018-12-08 NOTE — Telephone Encounter (Signed)
Patient contacted and advised that no one from this office called him today. Verbalized understanding.

## 2018-12-13 ENCOUNTER — Encounter (HOSPITAL_COMMUNITY): Payer: Self-pay | Admitting: Hematology

## 2018-12-13 ENCOUNTER — Inpatient Hospital Stay (HOSPITAL_COMMUNITY): Payer: Medicare HMO | Attending: Hematology | Admitting: Hematology

## 2018-12-13 ENCOUNTER — Inpatient Hospital Stay (HOSPITAL_COMMUNITY): Payer: Medicare HMO

## 2018-12-13 VITALS — BP 108/69 | HR 63 | Temp 97.8°F | Resp 16 | Wt 214.0 lb

## 2018-12-13 DIAGNOSIS — Z8249 Family history of ischemic heart disease and other diseases of the circulatory system: Secondary | ICD-10-CM | POA: Diagnosis not present

## 2018-12-13 DIAGNOSIS — Z923 Personal history of irradiation: Secondary | ICD-10-CM | POA: Insufficient documentation

## 2018-12-13 DIAGNOSIS — Z85828 Personal history of other malignant neoplasm of skin: Secondary | ICD-10-CM | POA: Diagnosis not present

## 2018-12-13 DIAGNOSIS — I10 Essential (primary) hypertension: Secondary | ICD-10-CM

## 2018-12-13 DIAGNOSIS — Z79899 Other long term (current) drug therapy: Secondary | ICD-10-CM | POA: Insufficient documentation

## 2018-12-13 DIAGNOSIS — Z8546 Personal history of malignant neoplasm of prostate: Secondary | ICD-10-CM | POA: Insufficient documentation

## 2018-12-13 DIAGNOSIS — C7951 Secondary malignant neoplasm of bone: Secondary | ICD-10-CM | POA: Diagnosis not present

## 2018-12-13 DIAGNOSIS — Z7901 Long term (current) use of anticoagulants: Secondary | ICD-10-CM | POA: Insufficient documentation

## 2018-12-13 DIAGNOSIS — C61 Malignant neoplasm of prostate: Secondary | ICD-10-CM

## 2018-12-13 DIAGNOSIS — Z87891 Personal history of nicotine dependence: Secondary | ICD-10-CM | POA: Insufficient documentation

## 2018-12-13 DIAGNOSIS — I252 Old myocardial infarction: Secondary | ICD-10-CM | POA: Diagnosis not present

## 2018-12-13 DIAGNOSIS — K5900 Constipation, unspecified: Secondary | ICD-10-CM | POA: Diagnosis not present

## 2018-12-13 DIAGNOSIS — Z8673 Personal history of transient ischemic attack (TIA), and cerebral infarction without residual deficits: Secondary | ICD-10-CM | POA: Insufficient documentation

## 2018-12-13 LAB — CBC WITH DIFFERENTIAL/PLATELET
Abs Immature Granulocytes: 0.01 10*3/uL (ref 0.00–0.07)
BASOS PCT: 1 %
Basophils Absolute: 0 10*3/uL (ref 0.0–0.1)
Eosinophils Absolute: 0.3 10*3/uL (ref 0.0–0.5)
Eosinophils Relative: 5 %
HCT: 42.4 % (ref 39.0–52.0)
Hemoglobin: 13 g/dL (ref 13.0–17.0)
IMMATURE GRANULOCYTES: 0 %
Lymphocytes Relative: 18 %
Lymphs Abs: 1.2 10*3/uL (ref 0.7–4.0)
MCH: 28.6 pg (ref 26.0–34.0)
MCHC: 30.7 g/dL (ref 30.0–36.0)
MCV: 93.4 fL (ref 80.0–100.0)
Monocytes Absolute: 0.5 10*3/uL (ref 0.1–1.0)
Monocytes Relative: 7 %
NEUTROS PCT: 69 %
NRBC: 0 % (ref 0.0–0.2)
Neutro Abs: 4.4 10*3/uL (ref 1.7–7.7)
PLATELETS: 197 10*3/uL (ref 150–400)
RBC: 4.54 MIL/uL (ref 4.22–5.81)
RDW: 14 % (ref 11.5–15.5)
WBC: 6.4 10*3/uL (ref 4.0–10.5)

## 2018-12-13 LAB — COMPREHENSIVE METABOLIC PANEL
ALT: 14 U/L (ref 0–44)
AST: 15 U/L (ref 15–41)
Albumin: 3.9 g/dL (ref 3.5–5.0)
Alkaline Phosphatase: 29 U/L — ABNORMAL LOW (ref 38–126)
Anion gap: 5 (ref 5–15)
BUN: 22 mg/dL (ref 8–23)
CHLORIDE: 109 mmol/L (ref 98–111)
CO2: 26 mmol/L (ref 22–32)
Calcium: 9 mg/dL (ref 8.9–10.3)
Creatinine, Ser: 0.78 mg/dL (ref 0.61–1.24)
GFR calc Af Amer: >60 mL/min (ref >60)
GFR calc non Af Amer: >60 mL/min (ref >60)
GLUCOSE: 117 mg/dL — AB (ref 70–99)
Potassium: 4.6 mmol/L (ref 3.5–5.1)
SODIUM: 140 mmol/L (ref 135–145)
Total Bilirubin: 0.7 mg/dL (ref 0.3–1.2)
Total Protein: 6.8 g/dL (ref 6.5–8.1)

## 2018-12-13 LAB — PSA: Prostatic Specific Antigen: 0.22 ng/mL (ref 0.00–4.00)

## 2018-12-13 MED ORDER — DENOSUMAB 120 MG/1.7ML ~~LOC~~ SOLN
120.0000 mg | Freq: Once | SUBCUTANEOUS | Status: AC
  Administered 2018-12-13: 120 mg via SUBCUTANEOUS
  Filled 2018-12-13: qty 1.7

## 2018-12-13 NOTE — Progress Notes (Signed)
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 85 y.o. 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 He is having problems with constipation. He is taking stool softeners but not everyday. He will start taking it everyday. Denies any nausea, vomiting, or diarrhea. Denies any new pains. Had not noticed any recent bleeding such as epistaxis, hematuria or hematochezia. Denies recent chest pain on exertion, shortness of breath on minimal exertion, pre-syncopal episodes, or palpitations. Denies any numbness or tingling in hands or feet. Denies any recent fevers, infections, or recent hospitalizations. Patient reports appetite at 100% and energy level at 75%. He is maintaining his weight well.    REVIEW OF SYSTEMS:  Review of Systems  Constitutional: Positive for fatigue.  Gastrointestinal: Positive for constipation.  All other systems reviewed and are negative.    PAST MEDICAL/SURGICAL HISTORY:  Past Medical History:  Diagnosis Date  . Arthritis   . Asthma    Childhood  . Chronic back pain   . Coronary atherosclerosis of native coronary artery    a. CABG 2004 with LIMA-LAD, SVG-D1, SVG-OM, SVG-PDA. b. Cath ~2010 with occ of SVG-diagonal, distal LAD and OM filiing by collaterals. c. NSTEMI 12/2014 s/p DES to SVG-RCA. d. 11/2015: DES to distal graft of the SVG-distal RCA  . Essential hypertension   . Foley catheter in place   . GERD (gastroesophageal reflux disease)   . Hard of hearing   .  Hyperglycemia   . Ischemic cardiomyopathy    a. EF 45% by cath 12/2014.  . Mixed hyperlipidemia   . NSTEMI (non-ST elevated myocardial infarction) ([place]) 01/01/15  . Pneumonia    hx of   . Prostate cancer [facility])    Radiation therapy 1996  . Skin cancer   . Stroke [facility])    TIA- 28 years ago    Past Surgical History:  Procedure Laterality Date  . CARDIAC CATHETERIZATION  01/01/2015   Procedure: CORONARY STENT INTERVENTION;  Surgeon: Peter M Martinique, MD;  Location: [facility] CATH LAB;  Service: Cardiovascular;;  SVG to PDA  . CARDIAC CATHETERIZATION N/A 11/21/2015   Procedure: Left Heart Cath and Cors/Grafts Angiography;  Surgeon: Troy Sine, MD;  Location: [place] CV LAB;  Service: Cardiovascular;  Laterality: N/A;  . CARDIAC CATHETERIZATION N/A 11/21/2015   Procedure: Coronary Stent Intervention;  Surgeon: Troy Sine, MD;  Location: [place] CV LAB;  Service: Cardiovascular;  Laterality: N/A;  . CARDIAC CATHETERIZATION N/A 09/11/2016   Procedure: Left Heart Cath and Cors/Grafts Angiography;  Surgeon: Leonie Man, MD;  Location: [place] CV LAB;  Service: Cardiovascular;  Laterality: N/A;  . CARDIAC CATHETERIZATION N/A 09/11/2016   Procedure: Coronary Stent Intervention;  Surgeon: Leonie Man, MD;  Location: [place] CV LAB;  Service: Cardiovascular;  Laterality: N/A;  . CORONARY ARTERY BYPASS GRAFT  2004   LIMA to LAD, SVG to diagonal, SVG to circumflex, SVG to PDA  . GREEN LIGHT LASER TURP (TRANSURETHRAL RESECTION OF PROSTATE N/A  06/04/2016   Procedure: GREEN LIGHT LASER TURP (TRANSURETHRAL RESECTION OF PROSTATE;  Surgeon: Irine Seal, MD;  Location: [facility] ORS;  Service: Urology;  Laterality: N/A;  . LEFT HEART CATHETERIZATION WITH CORONARY/GRAFT ANGIOGRAM N/A 01/01/2015   Procedure: LEFT HEART CATHETERIZATION WITH Beatrix Fetters;  Surgeon: Peter M Martinique, MD;  Location: [facility] CATH LAB;  Service: Cardiovascular;  Laterality: N/A;  . PERCUTANEOUS CORONARY STENT INTERVENTION  (PCI-S)  11/20/2015   distal SVG  with DES       . TONSILLECTOMY       SOCIAL HISTORY:  Social History   Socioeconomic History  . Marital status: Married    Spouse name: Not on file  . Number of children: Not on file  . Years of education: Not on file  . Highest education level: Not on file  Occupational History  . Occupation: Retired    Comment: Office manager  Social Needs  . Financial resource strain: Not on file  . Food insecurity:    Worry: Not on file    Inability: Not on file  . Transportation needs:    Medical: Not on file    Non-medical: Not on file  Tobacco Use  . Smoking status: Former Smoker    Packs/day: 0.50    Years: 27.00    Pack years: 13.50    Types: Cigars    Start date: 07/28/1964    Last attempt to quit: 07/28/1986    Years since quitting: 32.4  . Smokeless tobacco: Former Systems developer    Types: Chew    Quit date: 08/07/2010  . Tobacco comment: never chewed up over a pack/day  Substance and Sexual Activity  . Alcohol use: No    Alcohol/week: 0.0 standard drinks  . Drug use: No  . Sexual activity: Not on file  Lifestyle  . Physical activity:    Days per week: Not on file    Minutes per session: Not on file  . Stress: Not on file  Relationships  . Social connections:    Talks on phone: Not on file    Gets together: Not on file    Attends religious service: Not on file    Active member of club or organization: Not on file    Attends meetings of clubs or organizations: Not on file    Relationship status: Not on file  . Intimate partner violence:    Fear of current or ex partner: Not on file    Emotionally abused: Not on file    Physically abused: Not on file    Forced sexual activity: Not on file  Other Topics Concern  . Not on file  Social History Narrative  . Not on file    FAMILY HISTORY:  Family History  Problem Relation Age of Onset  . Hypertension Father   . Coronary artery disease Father     CURRENT MEDICATIONS:    Outpatient Encounter Medications as of 12/13/2018  Medication Sig Note  . acetaminophen (TYLENOL) 325 MG tablet Take 2 tablets (650 mg total) by mouth every 4 (four) hours as needed for headache or mild pain.   Marland Kitchen amLODipine (NORVASC) 5 MG tablet TAKE ONE TABLET BY MOUTH DAILY (MORNING)   . apixaban (ELIQUIS) 5 MG TABS tablet TAKE ONE TABLET BY MOUTH TWICE DAILY (MORNING ,EVENING)   . bisacodyl (DULCOLAX) 5 MG EC tablet Take 5-10 mg by mouth daily as needed (constipation). Reported on 05/28/2016   . calcium carbonate (OS-CAL - DOSED IN MG OF ELEMENTAL CALCIUM) 1250 (  500 Ca) MG tablet Take 1 tablet by mouth.   . clopidogrel (PLAVIX) 75 MG tablet TAKE ONE TABLET BY MOUTH EVERY DAY (,EVENING)   . doxazosin (CARDURA) 4 MG tablet TAKE ONE TABLET BY MOUTH DAILY. (,EVENING)   . enzalutamide (XTANDI) 40 MG capsule Take 4 capsules (160 mg total) by mouth daily.   . isosorbide mononitrate (IMDUR) 60 MG 24 hr tablet TAKE ONE TABLET BY MOUTH DAILY (MORNING)   . metoprolol tartrate (LOPRESSOR) 50 MG tablet TAKE ONE TABLET BY MOUTH TWICE DAILY (MORNING ,EVENING)   . nitroGLYCERIN (NITROSTAT) 0.4 MG SL tablet PLACE ONE (1) TABLET UNDER TONGUE EVERY 5 MINUTES UP TO (3) DOSES AS NEEDED FOR CHEST PAIN.   Marland Kitchen pantoprazole (PROTONIX) 40 MG tablet Take 1 tablet (40 mg total) by mouth daily.   Marland Kitchen PRESCRIPTION MEDICATION Hormone shots done at Dr. Ralene Muskrat office once every 4 months (for prostrate) - last injection mid December 2016 10/18/2018: Lupron every 4 months  . rosuvastatin (CRESTOR) 20 MG tablet TAKE ONE TABLET BY MOUTH DAILY (EVENING)   . tamsulosin (FLOMAX) 0.4 MG CAPS capsule TAKE ONE CAPSULE BY MOUTH TWICE DAILY. (MORNING ,EVENING)   . traMADol (ULTRAM) 50 MG tablet Take 0.5 tablets (25 mg total) by mouth daily as needed.    No facility-administered encounter medications on file as of 12/13/2018.     ALLERGIES:  No Known Allergies   PHYSICAL EXAM:  ECOG Performance status: 1  Vitals:   12/13/18 1151   BP: 108/69  Pulse: 63  Resp: 16  Temp: 97.8 F (36.6 C)  SpO2: 97%   Filed Weights   12/13/18 1151  Weight: 214 lb (97.1 kg)    Physical Exam Constitutional:      Appearance: Normal appearance. He is normal weight.  Cardiovascular:     Rate and Rhythm: Normal rate and regular rhythm.     Heart sounds: Normal heart sounds.  Pulmonary:     Effort: Pulmonary effort is normal.     Breath sounds: Normal breath sounds.  Musculoskeletal: Normal range of motion.  Skin:    General: Skin is warm and dry.  Neurological:     Mental Status: He is alert and oriented to person, place, and time. Mental status is at baseline.  Psychiatric:        Mood and Affect: Mood normal.        Behavior: Behavior normal.        Thought Content: Thought content normal.        Judgment: Judgment normal.      LABORATORY DATA:  I have reviewed the labs as listed.  CBC    Component Value Date/Time   WBC 6.4 12/13/2018 1045   RBC 4.54 12/13/2018 1045   HGB 13.0 12/13/2018 1045   HCT 42.4 12/13/2018 1045   PLT 197 12/13/2018 1045   MCV 93.4 12/13/2018 1045   MCH 28.6 12/13/2018 1045   MCHC 30.7 12/13/2018 1045   RDW 14.0 12/13/2018 1045   LYMPHSABS 1.2 12/13/2018 1045   MONOABS 0.5 12/13/2018 1045   EOSABS 0.3 12/13/2018 1045   BASOSABS 0.0 12/13/2018 1045   CMP Latest Ref Rng & Units 12/13/2018 11/15/2018 10/17/2018  Glucose 70 - 99 mg/dL 117(H) 110(H) 113(H)  BUN 8 - 23 mg/dL 22 21 15   Creatinine 0.61 - 1.24 mg/dL 0.78 0.87 0.74  Sodium 135 - 145 mmol/L 140 142 141  Potassium 3.5 - 5.1 mmol/L 4.6 4.5 4.4  Chloride 98 - 111 mmol/L 109 110 111  CO2 22 - 32 mmol/L 26 25 26   Calcium 8.9 - 10.3 mg/dL 9.0 9.4 8.4(L)  Total Protein 6.5 - 8.1 g/dL 6.8 6.7 7.0  Total Bilirubin 0.3 - 1.2 mg/dL 0.7 0.9 0.7  Alkaline Phos 38 - 126 U/L 29(L) 28(L) 33(L)  AST 15 - 41 U/L 15 16 16   ALT 0 - 44 U/L 14 17 16        DIAGNOSTIC IMAGING:  I have independently reviewed the scans and discussed with  the patient.   I have reviewed Elijah Finders, NP's note and agree with the documentation.  I personally performed a face-to-face visit, made revisions and my assessment and plan is as follows.    ASSESSMENT & PLAN:   Prostate cancer ([place]) 1.  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 -Enzalutamide 80 mg daily started on 02/08/2018, increased to 3 tablets daily, increased to full dose of 4 tablets daily in the first week of June. - He is tolerating enzalutamide 160 mg daily very well. -He continues to receive Lupron injections at Dr. Jethro Poling office every 4 months. - Last PSA was 0.27. -For the last 3 days he cut back on the dose of enzalutamide to 2 tablets daily as his constipation has gotten worse.  I have told him to take Dulcolax stool softener every day and use MiraLAX as needed.  He is also taking Dulcolax laxative as needed. -We reviewed his regular blood work which is grossly unchanged.  His PSA from today is pending. -We will see him back in 2 months for follow-up.  We will consider scans if PSA goes up.  2.  Bone mets: -He has 1 metastatic site on fluciclovine PET CT scan on the right ilium. -Denosumab was started on 05/02/2018.  He is tolerating it very well.  He will continue calcium supplements.       Orders placed this encounter:  Orders Placed This Encounter  Procedures  . PSA  . CBC with Differential/Platelet  . Comprehensive metabolic panel  . Comprehensive metabolic panel      Derek Jack, MD [place] [phone]

## 2018-12-13 NOTE — Progress Notes (Signed)
Proceed with XGeva. VSS. Labs reviewed per [facility] NP.    Vaughan Basta presents today for injection per MD orders. XGeva administered SQ in right Upper Arm. Administration without incident. Patient tolerated well. Vital signs stable. No complaints at this time. Discharged from clinic ambulatory. F/U with [facility] as scheduled.

## 2018-12-13 NOTE — Patient Instructions (Signed)
[facility] Discharge Instructions     Thank you for choosing [facility] at [facility] to provide your oncology and hematology care.  To afford each patient quality time with our provider, please arrive at least 15 minutes before your scheduled appointment time.   If you have a lab appointment with the Cancer Center please come in thru the  Main Entrance and check in at the main information desk  You need to re-schedule your appointment should you arrive 10 or more minutes late.  We strive to give you quality time with our providers, and arriving late affects you and other patients whose appointments are after yours.  Also, if you no show three or more times for appointments you may be dismissed from the clinic at the providers discretion.     Again, thank you for choosing [facility].  Our hope is that these requests will decrease the amount of time that you wait before being seen by our physicians.       _____________________________________________________________  Should you have questions after your visit to [facility], please contact our office at [phone] between the hours of 8:00 a.m. and 4:30 p.m.  Voicemails left after 4:00 p.m. will not be returned until the following business day.  For prescription refill requests, have your pharmacy contact our office and allow 72 hours.    Cancer Center Support Programs:   > Cancer Support Group  2nd Tuesday of the month 1pm-2pm, Journey Room    

## 2018-12-13 NOTE — Assessment & Plan Note (Signed)
1.  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 -Enzalutamide 80 mg daily started on 02/08/2018, increased to 3 tablets daily, increased to full dose of 4 tablets daily in the first week of June. - He is tolerating enzalutamide 160 mg daily very well. -He continues to receive Lupron injections at Dr. Jethro Poling office every 4 months. - Last PSA was 0.27. -For the last 3 days he cut back on the dose of enzalutamide to 2 tablets daily as his constipation has gotten worse.  I have told him to take Dulcolax stool softener every day and use MiraLAX as needed.  He is also taking Dulcolax laxative as needed. -We reviewed his regular blood work which is grossly unchanged.  His PSA from today is pending. -We will see him back in 2 months for follow-up.  We will consider scans if PSA goes up.  2.  Bone mets: -He has 1 metastatic site on fluciclovine PET CT scan on the right ilium. -Denosumab was started on 05/02/2018.  He is tolerating it very well.  He will continue calcium supplements.

## 2018-12-14 ENCOUNTER — Telehealth: Payer: Self-pay | Admitting: Cardiology

## 2018-12-14 MED FILL — XTANDI 40 MG CAPSULE: 40 | 30 days supply | Qty: 120 | Fill #1

## 2018-12-14 NOTE — Telephone Encounter (Signed)
Patient called stating that he was returning a call to [facility] in regards to a medication.

## 2018-12-14 NOTE — Telephone Encounter (Signed)
Called pt. As well as pharmacy. There was a PA needed for doxazosin. It was taken care of on 1/28- per pharmacist.

## 2018-12-28 ENCOUNTER — Other Ambulatory Visit: Payer: Self-pay | Admitting: *Deleted

## 2018-12-28 MED ORDER — DOXAZOSIN MESYLATE 4 MG PO TABS: ORAL_TABLET | ORAL | 2 refills | Status: DC

## 2018-12-28 MED ORDER — APIXABAN 5 MG PO TABS: ORAL_TABLET | ORAL | 2 refills | Status: DC

## 2018-12-29 DIAGNOSIS — M79676 Pain in unspecified toe(s): Secondary | ICD-10-CM | POA: Diagnosis not present

## 2018-12-29 DIAGNOSIS — B351 Tinea unguium: Secondary | ICD-10-CM | POA: Diagnosis not present

## 2019-01-02 DIAGNOSIS — C7951 Secondary malignant neoplasm of bone: Secondary | ICD-10-CM | POA: Diagnosis not present

## 2019-01-02 DIAGNOSIS — Z6832 Body mass index (BMI) 32.0-32.9, adult: Secondary | ICD-10-CM | POA: Diagnosis not present

## 2019-01-02 DIAGNOSIS — I4891 Unspecified atrial fibrillation: Secondary | ICD-10-CM | POA: Diagnosis not present

## 2019-01-02 DIAGNOSIS — C61 Malignant neoplasm of prostate: Secondary | ICD-10-CM | POA: Diagnosis not present

## 2019-01-02 DIAGNOSIS — K5900 Constipation, unspecified: Secondary | ICD-10-CM | POA: Diagnosis not present

## 2019-01-02 DIAGNOSIS — I10 Essential (primary) hypertension: Secondary | ICD-10-CM | POA: Diagnosis not present

## 2019-01-02 DIAGNOSIS — Z299 Encounter for prophylactic measures, unspecified: Secondary | ICD-10-CM | POA: Diagnosis not present

## 2019-01-06 ENCOUNTER — Telehealth (HOSPITAL_COMMUNITY): Payer: Self-pay

## 2019-01-06 NOTE — Telephone Encounter (Signed)
Patient stated he followed up with his PCP as directed by [name] and the PCP wanted him to have a colonoscopy regarding his constipation.  Instructed the patient if the PCP felt he needed a colonoscopy to go see the gastroenterologist for further evaluation.  Patient verbalized understanding.

## 2019-01-09 MED FILL — XTANDI 40 MG CAPSULE: 40 | 30 days supply | Qty: 120 | Fill #2

## 2019-01-10 ENCOUNTER — Ambulatory Visit (INDEPENDENT_AMBULATORY_CARE_PROVIDER_SITE_OTHER): Payer: Medicare HMO | Admitting: Internal Medicine

## 2019-01-10 ENCOUNTER — Encounter (INDEPENDENT_AMBULATORY_CARE_PROVIDER_SITE_OTHER): Payer: Self-pay | Admitting: Internal Medicine

## 2019-01-10 ENCOUNTER — Telehealth (INDEPENDENT_AMBULATORY_CARE_PROVIDER_SITE_OTHER): Payer: Self-pay | Admitting: Internal Medicine

## 2019-01-10 VITALS — BP 137/74 | HR 73 | Temp 97.9°F | Ht 68.0 in | Wt 213.5 lb

## 2019-01-10 DIAGNOSIS — C61 Malignant neoplasm of prostate: Secondary | ICD-10-CM | POA: Diagnosis not present

## 2019-01-10 DIAGNOSIS — K5900 Constipation, unspecified: Secondary | ICD-10-CM | POA: Diagnosis not present

## 2019-01-10 NOTE — Telephone Encounter (Signed)
Daughter ( [name]) called wanted to know if he is to stop taking otc medications - please call her at [phone]

## 2019-01-10 NOTE — Telephone Encounter (Signed)
Stop the other OTC. Will consider colonoscopy at some point.

## 2019-01-10 NOTE — Patient Instructions (Addendum)
Am going to start on Amitiza twice a day. Daughter will call with a PR in 2 weeks. We will consider a colonoscopy at some point.

## 2019-01-10 NOTE — Progress Notes (Signed)
Subjective:    Patient ID: Elijah Santana, male    DOB: September 09, 1932, 85 y.o.   MRN: [id number]  HPI Referred by Dr. Woody Seller for constipation/colonoscopy.   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 Inially diagnosed in 1996. Status post radiation therapy.  Present taking Enzalutamide 80mg  daily, started on 02/08/2018, increased to 3 tabs daily, increased to full dose of 4 tabs daily. At present he is only taking 2 tabs a day due to constipation.  Receives Lupron injections at Dr. Ralene Muskrat office every 4 months.  He has been taking Dulcolax, MIralax, stool softener. He is having a BM once every 4-5 after taking a lot of laxatives.  Has had constipation for year. Usually can take 2 Ducolax and will have a BM. His stools are not hard.  He does not drink a lot of fluid. If he drinks a lot of fluid, he voids a lot.  His appetite is okay. No weight loss. Eating 3 meals a day.  His last colonoscopy was probably greater than 15 yrs.    CABG Hx of angioplasty and maintained on Eliquis and Plavix  Review of Systems Past Medical History:  Diagnosis Date  . Arthritis   . Asthma    Childhood  . Chronic back pain   . Coronary atherosclerosis of native coronary artery    a. CABG 2004 with LIMA-LAD, SVG-D1, SVG-OM, SVG-PDA. b. Cath ~2010 with occ of SVG-diagonal, distal LAD and OM filiing by collaterals. c. NSTEMI 12/2014 s/p DES to SVG-RCA. d. 11/2015: DES to distal graft of the SVG-distal RCA  . Essential hypertension   . Foley catheter in place   . GERD (gastroesophageal reflux disease)   . Hard of hearing   . Hyperglycemia   . Ischemic cardiomyopathy    a. EF 45% by cath 12/2014.  . Mixed hyperlipidemia   . NSTEMI (non-ST elevated myocardial infarction) ([place]) 01/01/15  . Pneumonia    hx of   . Prostate cancer [facility])    Radiation therapy 1996  . Skin cancer   . Stroke [facility])    TIA- 28 years ago     Past Surgical History:  Procedure Laterality Date  .  CARDIAC CATHETERIZATION  01/01/2015   Procedure: CORONARY STENT INTERVENTION;  Surgeon: Peter M Martinique, MD;  Location: [facility] CATH LAB;  Service: Cardiovascular;;  SVG to PDA  . CARDIAC CATHETERIZATION N/A 11/21/2015   Procedure: Left Heart Cath and Cors/Grafts Angiography;  Surgeon: Troy Sine, MD;  Location: [place] CV LAB;  Service: Cardiovascular;  Laterality: N/A;  . CARDIAC CATHETERIZATION N/A 11/21/2015   Procedure: Coronary Stent Intervention;  Surgeon: Troy Sine, MD;  Location: [place] CV LAB;  Service: Cardiovascular;  Laterality: N/A;  . CARDIAC CATHETERIZATION N/A 09/11/2016   Procedure: Left Heart Cath and Cors/Grafts Angiography;  Surgeon: Leonie Man, MD;  Location: [place] CV LAB;  Service: Cardiovascular;  Laterality: N/A;  . CARDIAC CATHETERIZATION N/A 09/11/2016   Procedure: Coronary Stent Intervention;  Surgeon: Leonie Man, MD;  Location: [place] CV LAB;  Service: Cardiovascular;  Laterality: N/A;  . CORONARY ARTERY BYPASS GRAFT  2004   LIMA to LAD, SVG to diagonal, SVG to circumflex, SVG to PDA  . GREEN LIGHT LASER TURP (TRANSURETHRAL RESECTION OF PROSTATE N/A 06/04/2016   Procedure: GREEN LIGHT LASER TURP (TRANSURETHRAL RESECTION OF PROSTATE;  Surgeon: Irine Seal, MD;  Location: [facility] ORS;  Service: Urology;  Laterality: N/A;  . LEFT HEART  CATHETERIZATION WITH CORONARY/GRAFT ANGIOGRAM N/A 01/01/2015   Procedure: LEFT HEART CATHETERIZATION WITH Beatrix Fetters;  Surgeon: Peter M Martinique, MD;  Location: [facility] CATH LAB;  Service: Cardiovascular;  Laterality: N/A;  . PERCUTANEOUS CORONARY STENT INTERVENTION (PCI-S)  11/20/2015   distal SVG  with DES       . TONSILLECTOMY      No Known Allergies  Current Outpatient Medications on File Prior to Visit  Medication Sig Dispense Refill  . amLODipine (NORVASC) 5 MG tablet TAKE ONE TABLET BY MOUTH DAILY (MORNING) 30 tablet 3  . apixaban (ELIQUIS) 5 MG TABS tablet TAKE ONE TABLET BY MOUTH TWICE DAILY  (MORNING ,EVENING) 180 tablet 2  . bisacodyl (DULCOLAX) 5 MG EC tablet Take 5-10 mg by mouth daily as needed (constipation). Reported on 05/28/2016    . calcium carbonate (OS-CAL - DOSED IN MG OF ELEMENTAL CALCIUM) 1250 (500 Ca) MG tablet Take 1 tablet by mouth.    . clopidogrel (PLAVIX) 75 MG tablet TAKE ONE TABLET BY MOUTH EVERY DAY (,EVENING) 30 tablet 6  . doxazosin (CARDURA) 4 MG tablet TAKE ONE TABLET BY MOUTH DAILY. (,EVENING) 90 tablet 2  . enzalutamide (XTANDI) 40 MG capsule Take 4 capsules (160 mg total) by mouth daily. 120 capsule 3  . isosorbide mononitrate (IMDUR) 60 MG 24 hr tablet TAKE ONE TABLET BY MOUTH DAILY (MORNING) 30 tablet 6  . metoprolol tartrate (LOPRESSOR) 50 MG tablet TAKE ONE TABLET BY MOUTH TWICE DAILY (MORNING ,EVENING) 180 tablet 3  . nitroGLYCERIN (NITROSTAT) 0.4 MG SL tablet PLACE ONE (1) TABLET UNDER TONGUE EVERY 5 MINUTES UP TO (3) DOSES AS NEEDED FOR CHEST PAIN. 25 tablet 3  . pantoprazole (PROTONIX) 40 MG tablet Take 1 tablet (40 mg total) by mouth daily. 90 tablet 3  . PRESCRIPTION MEDICATION Hormone shots done at Dr. Ralene Muskrat office once every 4 months (for prostrate) - last injection mid December 2016    . rosuvastatin (CRESTOR) 20 MG tablet TAKE ONE TABLET BY MOUTH DAILY (EVENING) 30 tablet 6  . tamsulosin (FLOMAX) 0.4 MG CAPS capsule TAKE ONE CAPSULE BY MOUTH TWICE DAILY. (MORNING ,EVENING)  11  . traMADol (ULTRAM) 50 MG tablet Take 0.5 tablets (25 mg total) by mouth daily as needed. 30 tablet 0  . acetaminophen (TYLENOL) 325 MG tablet Take 2 tablets (650 mg total) by mouth every 4 (four) hours as needed for headache or mild pain.     No current facility-administered medications on file prior to visit.         Objective:   Physical Exam Blood pressure 137/74, pulse 73, temperature 97.9 F (36.6 C), height 5\' 8"  (1.727 m), weight 213 lb 8 oz (96.8 kg). Alert and oriented. Skin warm and dry. Oral mucosa is moist.   . Sclera anicteric, conjunctivae is  pink. Thyroid not enlarged. No cervical lymphadenopathy. Lungs clear. Heart regular rate and rhythm.  Abdomen is soft. Bowel sounds are positive. No hepatomegaly. No abdominal masses felt. No tenderness.  No edema to lower extremities.         Assessment & Plan:  Constipation. Am going to start him on Amitiza daily.  Will consider colonoscopy at some point. Stop other meds for constipation.

## 2019-01-11 ENCOUNTER — Inpatient Hospital Stay (HOSPITAL_COMMUNITY): Payer: Medicare HMO | Attending: Hematology

## 2019-01-11 ENCOUNTER — Inpatient Hospital Stay (HOSPITAL_COMMUNITY): Payer: Medicare HMO

## 2019-01-11 VITALS — BP 132/71 | HR 65 | Temp 97.9°F | Resp 16

## 2019-01-11 DIAGNOSIS — C61 Malignant neoplasm of prostate: Secondary | ICD-10-CM

## 2019-01-11 DIAGNOSIS — C7951 Secondary malignant neoplasm of bone: Secondary | ICD-10-CM

## 2019-01-11 LAB — COMPREHENSIVE METABOLIC PANEL
ALK PHOS: 32 U/L — AB (ref 38–126)
ALT: 13 U/L (ref 0–44)
AST: 14 U/L — ABNORMAL LOW (ref 15–41)
Albumin: 3.9 g/dL (ref 3.5–5.0)
Anion gap: 6 (ref 5–15)
BUN: 15 mg/dL (ref 8–23)
CO2: 24 mmol/L (ref 22–32)
CREATININE: 0.69 mg/dL (ref 0.61–1.24)
Calcium: 8.6 mg/dL — ABNORMAL LOW (ref 8.9–10.3)
Chloride: 110 mmol/L (ref 98–111)
GFR calc Af Amer: >60 mL/min (ref >60)
GFR calc non Af Amer: >60 mL/min (ref >60)
Glucose, Bld: 111 mg/dL — ABNORMAL HIGH (ref 70–99)
Potassium: 4.2 mmol/L (ref 3.5–5.1)
Sodium: 140 mmol/L (ref 135–145)
Total Bilirubin: 0.8 mg/dL (ref 0.3–1.2)
Total Protein: 6.4 g/dL — ABNORMAL LOW (ref 6.5–8.1)

## 2019-01-11 MED ORDER — DENOSUMAB 120 MG/1.7ML ~~LOC~~ SOLN
120.0000 mg | Freq: Once | SUBCUTANEOUS | Status: AC
  Administered 2019-01-11: 120 mg via SUBCUTANEOUS
  Filled 2019-01-11: qty 1.7

## 2019-01-11 NOTE — Patient Instructions (Signed)
[facility] _______________________________________________________________  Thank you for choosing [facility] at [facility] to provide your oncology and hematology care.  To afford each patient quality time with our providers, please arrive at least 15 minutes before your scheduled appointment.  You need to re-schedule your appointment if you arrive 10 or more minutes late.  We strive to give you quality time with our providers, and arriving late affects you and other patients whose appointments are after yours.  Also, if you no show three or more times for appointments you may be dismissed from the clinic.  Again, thank you for choosing [facility] at [facility]. Our hope is that these requests will allow you access to exceptional care and in a timely manner. _______________________________________________________________  If you have questions after your visit, please contact our office at [phone] between the hours of 8:30 a.m. and 5:00 p.m. Voicemails left after 4:30 p.m. will not be returned until the following business day. _______________________________________________________________  For prescription refill requests, have your pharmacy contact our office. _______________________________________________________________  Recommendations made by the consultant and any test results will be sent to your referring physician. _______________________________________________________________ 

## 2019-01-11 NOTE — Progress Notes (Signed)
Pt tolerated Xgeva injeciton without incident or complaint. VSS. Ca 8.6. Denies recent dental work. Denies jaw or tooth pain. States he is taking his Ca and Vit D but sometimes forgets. I reminded him of the importance of taking these as directed. Discharged self ambulatory in satisfactory condition.

## 2019-01-12 DIAGNOSIS — R413 Other amnesia: Secondary | ICD-10-CM | POA: Diagnosis not present

## 2019-01-19 ENCOUNTER — Ambulatory Visit (INDEPENDENT_AMBULATORY_CARE_PROVIDER_SITE_OTHER): Payer: Medicare HMO | Admitting: Internal Medicine

## 2019-01-23 DIAGNOSIS — I10 Essential (primary) hypertension: Secondary | ICD-10-CM | POA: Diagnosis not present

## 2019-01-23 DIAGNOSIS — Z6830 Body mass index (BMI) 30.0-30.9, adult: Secondary | ICD-10-CM | POA: Diagnosis not present

## 2019-01-23 DIAGNOSIS — I4891 Unspecified atrial fibrillation: Secondary | ICD-10-CM | POA: Diagnosis not present

## 2019-01-23 DIAGNOSIS — Z299 Encounter for prophylactic measures, unspecified: Secondary | ICD-10-CM | POA: Diagnosis not present

## 2019-01-23 DIAGNOSIS — R413 Other amnesia: Secondary | ICD-10-CM | POA: Diagnosis not present

## 2019-01-27 DIAGNOSIS — I10 Essential (primary) hypertension: Secondary | ICD-10-CM | POA: Diagnosis not present

## 2019-01-27 DIAGNOSIS — I2510 Atherosclerotic heart disease of native coronary artery without angina pectoris: Secondary | ICD-10-CM | POA: Diagnosis not present

## 2019-01-27 DIAGNOSIS — M159 Polyosteoarthritis, unspecified: Secondary | ICD-10-CM | POA: Diagnosis not present

## 2019-02-03 ENCOUNTER — Telehealth (INDEPENDENT_AMBULATORY_CARE_PROVIDER_SITE_OTHER): Payer: Self-pay | Admitting: Internal Medicine

## 2019-02-03 NOTE — Telephone Encounter (Signed)
Daughter called stated patient is still having issues and has probably gotten worse - please call [phone]

## 2019-02-06 MED FILL — XTANDI 40 MG CAPSULE: 40 | 30 days supply | Qty: 120 | Fill #3

## 2019-02-06 NOTE — Telephone Encounter (Signed)
He will let me know when he is ready for a colonoscopy.

## 2019-02-08 ENCOUNTER — Other Ambulatory Visit: Payer: Self-pay

## 2019-02-08 ENCOUNTER — Encounter (HOSPITAL_COMMUNITY): Payer: Self-pay | Admitting: Hematology

## 2019-02-08 ENCOUNTER — Inpatient Hospital Stay (HOSPITAL_COMMUNITY): Payer: Medicare HMO | Attending: Hematology

## 2019-02-08 ENCOUNTER — Inpatient Hospital Stay (HOSPITAL_COMMUNITY): Payer: Medicare HMO

## 2019-02-08 ENCOUNTER — Encounter (HOSPITAL_COMMUNITY): Payer: Self-pay

## 2019-02-08 ENCOUNTER — Inpatient Hospital Stay (HOSPITAL_COMMUNITY): Payer: Medicare HMO | Admitting: Hematology

## 2019-02-08 VITALS — BP 167/79 | HR 63 | Temp 98.0°F | Resp 18 | Wt 212.7 lb

## 2019-02-08 DIAGNOSIS — Z7901 Long term (current) use of anticoagulants: Secondary | ICD-10-CM | POA: Insufficient documentation

## 2019-02-08 DIAGNOSIS — Z923 Personal history of irradiation: Secondary | ICD-10-CM | POA: Insufficient documentation

## 2019-02-08 DIAGNOSIS — R600 Localized edema: Secondary | ICD-10-CM | POA: Insufficient documentation

## 2019-02-08 DIAGNOSIS — C61 Malignant neoplasm of prostate: Secondary | ICD-10-CM

## 2019-02-08 DIAGNOSIS — G4700 Insomnia, unspecified: Secondary | ICD-10-CM

## 2019-02-08 DIAGNOSIS — I10 Essential (primary) hypertension: Secondary | ICD-10-CM | POA: Diagnosis not present

## 2019-02-08 DIAGNOSIS — R5383 Other fatigue: Secondary | ICD-10-CM | POA: Diagnosis not present

## 2019-02-08 DIAGNOSIS — C7951 Secondary malignant neoplasm of bone: Secondary | ICD-10-CM | POA: Diagnosis not present

## 2019-02-08 DIAGNOSIS — Z79899 Other long term (current) drug therapy: Secondary | ICD-10-CM

## 2019-02-08 DIAGNOSIS — Z87891 Personal history of nicotine dependence: Secondary | ICD-10-CM | POA: Diagnosis not present

## 2019-02-08 DIAGNOSIS — Z7951 Long term (current) use of inhaled steroids: Secondary | ICD-10-CM | POA: Insufficient documentation

## 2019-02-08 DIAGNOSIS — R0602 Shortness of breath: Secondary | ICD-10-CM | POA: Insufficient documentation

## 2019-02-08 DIAGNOSIS — R200 Anesthesia of skin: Secondary | ICD-10-CM

## 2019-02-08 DIAGNOSIS — K5900 Constipation, unspecified: Secondary | ICD-10-CM | POA: Diagnosis not present

## 2019-02-08 DIAGNOSIS — Z85828 Personal history of other malignant neoplasm of skin: Secondary | ICD-10-CM | POA: Diagnosis not present

## 2019-02-08 LAB — COMPREHENSIVE METABOLIC PANEL
ALT: 13 U/L (ref 0–44)
AST: 15 U/L (ref 15–41)
Albumin: 4.2 g/dL (ref 3.5–5.0)
Alkaline Phosphatase: 35 U/L — ABNORMAL LOW (ref 38–126)
Anion gap: 4 — ABNORMAL LOW (ref 5–15)
BUN: 18 mg/dL (ref 8–23)
CHLORIDE: 112 mmol/L — AB (ref 98–111)
CO2: 25 mmol/L (ref 22–32)
Calcium: 9 mg/dL (ref 8.9–10.3)
Creatinine, Ser: 0.79 mg/dL (ref 0.61–1.24)
GFR calc Af Amer: >60 mL/min (ref >60)
GFR calc non Af Amer: >60 mL/min (ref >60)
Glucose, Bld: 109 mg/dL — ABNORMAL HIGH (ref 70–99)
Potassium: 4.8 mmol/L (ref 3.5–5.1)
Sodium: 141 mmol/L (ref 135–145)
Total Bilirubin: 0.4 mg/dL (ref 0.3–1.2)
Total Protein: 7 g/dL (ref 6.5–8.1)

## 2019-02-08 LAB — CBC WITH DIFFERENTIAL/PLATELET
Abs Immature Granulocytes: 0.02 10*3/uL (ref 0.00–0.07)
Basophils Absolute: 0 10*3/uL (ref 0.0–0.1)
Basophils Relative: 1 %
Eosinophils Absolute: 0.3 10*3/uL (ref 0.0–0.5)
Eosinophils Relative: 5 %
HCT: 41.2 % (ref 39.0–52.0)
Hemoglobin: 12.9 g/dL — ABNORMAL LOW (ref 13.0–17.0)
IMMATURE GRANULOCYTES: 0 %
Lymphocytes Relative: 20 %
Lymphs Abs: 1.2 10*3/uL (ref 0.7–4.0)
MCH: 29.3 pg (ref 26.0–34.0)
MCHC: 31.3 g/dL (ref 30.0–36.0)
MCV: 93.4 fL (ref 80.0–100.0)
Monocytes Absolute: 0.4 10*3/uL (ref 0.1–1.0)
Monocytes Relative: 7 %
NEUTROS PCT: 67 %
Neutro Abs: 4 10*3/uL (ref 1.7–7.7)
Platelets: 195 10*3/uL (ref 150–400)
RBC: 4.41 MIL/uL (ref 4.22–5.81)
RDW: 14.1 % (ref 11.5–15.5)
WBC: 6 10*3/uL (ref 4.0–10.5)
nRBC: 0 % (ref 0.0–0.2)

## 2019-02-08 LAB — PSA: Prostatic Specific Antigen: 0.22 ng/mL (ref 0.00–4.00)

## 2019-02-08 MED ORDER — DENOSUMAB 120 MG/1.7ML ~~LOC~~ SOLN
120.0000 mg | Freq: Once | SUBCUTANEOUS | Status: AC
  Administered 2019-02-08: 120 mg via SUBCUTANEOUS
  Filled 2019-02-08: qty 1.7

## 2019-02-08 NOTE — Progress Notes (Signed)
Patient is here today for his Xgeva injection. His last dose was 2/26 and it is due again today. Patient had consent signed on 11/15/18.  His calcium level today is 9.0 and is within parameters to administer.  Patient was discharged from clinic ambulatory and in stable condition.

## 2019-02-08 NOTE — Assessment & Plan Note (Signed)
1.  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 -Enzalutamide 80 mg daily started on 02/08/2018, increased to 3 tablets daily, increased to full dose of 4 tablets daily in the first week of June. - He is tolerating enzalutamide 4 tablets daily very well. -He is receiving Lupron injections at Dr. Jethro Poling office once every 4 months.  He has a follow-up appointment next week. -We reviewed the results of PSA from last visit which has come down to 0.22.  -His constipation is well controlled with Dulcolax and MiraLAX.  - I will see him back in 2 months for follow-up with repeat PSA.  2.  Bone metastasis: -He has 1 metastatic site on fluciclovine PET CT scan on the right ilium. -Denosumab was started on 05/02/2018.  He is tolerating it very well.  Today calcium is 9. -She was told to continue calcium twice daily.

## 2019-02-08 NOTE — Patient Instructions (Signed)
[place] at [facility] Discharge Instructions  We gave you your [name] today. You will get it again next month.    Thank you for choosing [place] at [facility] to provide your oncology and hematology care.  To afford each patient quality time with our provider, please arrive at least 15 minutes before your scheduled appointment time.   If you have a lab appointment with the [place] please come in thru the  Main Entrance and check in at the main information desk  You need to re-schedule your appointment should you arrive 10 or more minutes late.  We strive to give you quality time with our providers, and arriving late affects you and other patients whose appointments are after yours.  Also, if you no show three or more times for appointments you may be dismissed from the clinic at the providers discretion.     Again, thank you for choosing [facility].  Our hope is that these requests will decrease the amount of time that you wait before being seen by our physicians.       _____________________________________________________________  Should you have questions after your visit to [facility], please contact our office at [phone] between the hours of 8:00 a.m. and 4:30 p.m.  Voicemails left after 4:00 p.m. will not be returned until the following business day.  For prescription refill requests, have your pharmacy contact our office and allow 72 hours.    Cancer Center Support Programs:   > Cancer Support Group  2nd Tuesday of the month 1pm-2pm, Journey Room

## 2019-02-08 NOTE — Progress Notes (Signed)
[place], [place] [address]   CLINIC:  Medical Oncology/Hematology  PCP:  Glenda Chroman, MD [place] [address] [phone]   REASON FOR VISIT:  Follow-up for Metastatic castration resistant prostate cancer to the lymph nodes and bone  CURRENT THERAPY:Enzalutamide 4 tabs daily     BRIEF ONCOLOGIC HISTORY:    Prostate cancer ([place])   11/27/2017 Initial Diagnosis    Prostate cancer [facility])      CANCER STAGING: Cancer Staging No matching staging information was found for the patient.   INTERVAL HISTORY:  Elijah Santana 85 y.o. male returns for routine follow-up. He is here today alone. He states that he still has constipation at times. He states that he continues to get the Lupron injection from [name]. Denies any nausea, vomiting, or diarrhea. Denies any new pains. Had not noticed any recent bleeding such as epistaxis, hematuria or hematochezia. Denies recent chest pain on exertion,  pre-syncopal episodes, or palpitations. Denies any numbness or tingling in feet. Denies any recent fevers, infections, or recent hospitalizations. Patient reports appetite at 100% and energy level at 50%.   REVIEW OF SYSTEMS:  Review of Systems  Constitutional: Positive for fatigue.  Respiratory: Positive for shortness of breath.   Gastrointestinal: Positive for constipation.  Neurological: Positive for numbness.  Psychiatric/Behavioral: Positive for sleep disturbance.     PAST MEDICAL/SURGICAL HISTORY:  Past Medical History:  Diagnosis Date  . Arthritis   . Asthma    Childhood  . Chronic back pain   . Coronary atherosclerosis of native coronary artery    a. CABG 2004 with LIMA-LAD, SVG-D1, SVG-OM, SVG-PDA. b. Cath ~2010 with occ of SVG-diagonal, distal LAD and OM filiing by collaterals. c. NSTEMI 12/2014 s/p DES to SVG-RCA. d. 11/2015: DES to distal graft of the SVG-distal RCA  . Essential hypertension   . Foley catheter in place   .  GERD (gastroesophageal reflux disease)   . Hard of hearing   . Hyperglycemia   . Ischemic cardiomyopathy    a. EF 45% by cath 12/2014.  . Mixed hyperlipidemia   . NSTEMI (non-ST elevated myocardial infarction) ([place]) 01/01/15  . Pneumonia    hx of   . Prostate cancer [facility])    Radiation therapy 1996  . Skin cancer   . Stroke [facility])    TIA- 28 years ago    Past Surgical History:  Procedure Laterality Date  . CARDIAC CATHETERIZATION  01/01/2015   Procedure: CORONARY STENT INTERVENTION;  Surgeon: Peter M Martinique, MD;  Location: [facility] CATH LAB;  Service: Cardiovascular;;  SVG to PDA  . CARDIAC CATHETERIZATION N/A 11/21/2015   Procedure: Left Heart Cath and Cors/Grafts Angiography;  Surgeon: Troy Sine, MD;  Location: [place] CV LAB;  Service: Cardiovascular;  Laterality: N/A;  . CARDIAC CATHETERIZATION N/A 11/21/2015   Procedure: Coronary Stent Intervention;  Surgeon: Troy Sine, MD;  Location: [place] CV LAB;  Service: Cardiovascular;  Laterality: N/A;  . CARDIAC CATHETERIZATION N/A 09/11/2016   Procedure: Left Heart Cath and Cors/Grafts Angiography;  Surgeon: Leonie Man, MD;  Location: [place] CV LAB;  Service: Cardiovascular;  Laterality: N/A;  . CARDIAC CATHETERIZATION N/A 09/11/2016   Procedure: Coronary Stent Intervention;  Surgeon: Leonie Man, MD;  Location: [place] CV LAB;  Service: Cardiovascular;  Laterality: N/A;  . CORONARY ARTERY BYPASS GRAFT  2004   LIMA to LAD, SVG to diagonal, SVG to circumflex, SVG to PDA  .  GREEN LIGHT LASER TURP (TRANSURETHRAL RESECTION OF PROSTATE N/A 06/04/2016   Procedure: GREEN LIGHT LASER TURP (TRANSURETHRAL RESECTION OF PROSTATE;  Surgeon: Irine Seal, MD;  Location: [facility] ORS;  Service: Urology;  Laterality: N/A;  . LEFT HEART CATHETERIZATION WITH CORONARY/GRAFT ANGIOGRAM N/A 01/01/2015   Procedure: LEFT HEART CATHETERIZATION WITH Beatrix Fetters;  Surgeon: Peter M Martinique, MD;  Location: [facility] CATH LAB;  Service:  Cardiovascular;  Laterality: N/A;  . PERCUTANEOUS CORONARY STENT INTERVENTION (PCI-S)  11/20/2015   distal SVG  with DES       . TONSILLECTOMY       SOCIAL HISTORY:  Social History   Socioeconomic History  . Marital status: Married    Spouse name: Not on file  . Number of children: Not on file  . Years of education: Not on file  . Highest education level: Not on file  Occupational History  . Occupation: Retired    Comment: Office manager  Social Needs  . Financial resource strain: Not on file  . Food insecurity:    Worry: Not on file    Inability: Not on file  . Transportation needs:    Medical: Not on file    Non-medical: Not on file  Tobacco Use  . Smoking status: Former Smoker    Packs/day: 0.50    Years: 27.00    Pack years: 13.50    Types: Cigars    Start date: 07/28/1964    Last attempt to quit: 07/28/1986    Years since quitting: 32.5  . Smokeless tobacco: Former Systems developer    Types: Chew    Quit date: 08/07/2010  . Tobacco comment: never chewed up over a pack/day  Substance and Sexual Activity  . Alcohol use: No    Alcohol/week: 0.0 standard drinks  . Drug use: No  . Sexual activity: Not on file  Lifestyle  . Physical activity:    Days per week: Not on file    Minutes per session: Not on file  . Stress: Not on file  Relationships  . Social connections:    Talks on phone: Not on file    Gets together: Not on file    Attends religious service: Not on file    Active member of club or organization: Not on file    Attends meetings of clubs or organizations: Not on file    Relationship status: Not on file  . Intimate partner violence:    Fear of current or ex partner: Not on file    Emotionally abused: Not on file    Physically abused: Not on file    Forced sexual activity: Not on file  Other Topics Concern  . Not on file  Social History Narrative  . Not on file    FAMILY HISTORY:  Family History  Problem Relation Age of Onset  . Hypertension  Father   . Coronary artery disease Father     CURRENT MEDICATIONS:  Outpatient Encounter Medications as of 02/08/2019  Medication Sig Note  . acetaminophen (TYLENOL) 325 MG tablet Take 2 tablets (650 mg total) by mouth every 4 (four) hours as needed for headache or mild pain.   Marland Kitchen amLODipine (NORVASC) 5 MG tablet TAKE ONE TABLET BY MOUTH DAILY (MORNING)   . apixaban (ELIQUIS) 5 MG TABS tablet TAKE ONE TABLET BY MOUTH TWICE DAILY (MORNING ,EVENING)   . bisacodyl (DULCOLAX) 5 MG EC tablet Take 5-10 mg by mouth daily as needed (constipation). Reported on 05/28/2016   . calcium carbonate (OS-CAL -  DOSED IN MG OF ELEMENTAL CALCIUM) 1250 (500 Ca) MG tablet Take 1 tablet by mouth.   . clopidogrel (PLAVIX) 75 MG tablet TAKE ONE TABLET BY MOUTH EVERY DAY (,EVENING)   . doxazosin (CARDURA) 4 MG tablet TAKE ONE TABLET BY MOUTH DAILY. (,EVENING)   . enzalutamide (XTANDI) 40 MG capsule Take 4 capsules (160 mg total) by mouth daily.   . isosorbide mononitrate (IMDUR) 60 MG 24 hr tablet TAKE ONE TABLET BY MOUTH DAILY (MORNING)   . ketoconazole (NIZORAL) 2 % cream APPLY TWICE DAILY TO THE BOTTOMS OF BOTH FEET.   . metoprolol tartrate (LOPRESSOR) 50 MG tablet TAKE ONE TABLET BY MOUTH TWICE DAILY (MORNING ,EVENING)   . nitroGLYCERIN (NITROSTAT) 0.4 MG SL tablet PLACE ONE (1) TABLET UNDER TONGUE EVERY 5 MINUTES UP TO (3) DOSES AS NEEDED FOR CHEST PAIN.   Marland Kitchen pantoprazole (PROTONIX) 40 MG tablet Take 1 tablet (40 mg total) by mouth daily.   Marland Kitchen PRESCRIPTION MEDICATION Hormone shots done at Dr. Ralene Muskrat office once every 4 months (for prostrate) - last injection mid December 2016 10/18/2018: Lupron every 4 months  . rosuvastatin (CRESTOR) 20 MG tablet TAKE ONE TABLET BY MOUTH DAILY (EVENING)   . STOOL SOFTENER 100 MG capsule TAKE 1 OR 2 CAPSULES BY MOUTH DAILY AS NEEDED.   Marland Kitchen tamsulosin (FLOMAX) 0.4 MG CAPS capsule TAKE ONE CAPSULE BY MOUTH TWICE DAILY. (MORNING ,EVENING)   . traMADol (ULTRAM) 50 MG tablet Take 0.5 tablets  (25 mg total) by mouth daily as needed.    No facility-administered encounter medications on file as of 02/08/2019.     ALLERGIES:  No Known Allergies   PHYSICAL EXAM:  ECOG Performance status: 1  Vitals:   02/08/19 0830  BP: (!) 167/79  Pulse: 63  Resp: 18  Temp: 98 F (36.7 C)  SpO2: 97%   Filed Weights   02/08/19 0830  Weight: 212 lb 11.2 oz (96.5 kg)    Physical Exam Constitutional:      Appearance: Normal appearance.  Cardiovascular:     Rate and Rhythm: Normal rate and regular rhythm.     Heart sounds: Normal heart sounds.  Pulmonary:     Effort: Pulmonary effort is normal.     Breath sounds: Normal breath sounds.  Abdominal:     Palpations: Abdomen is soft. There is no mass.  Musculoskeletal:     Right lower leg: Edema present.     Left lower leg: Edema present.  Skin:    General: Skin is warm.  Neurological:     General: No focal deficit present.     Mental Status: He is alert and oriented to person, place, and time.  Psychiatric:        Mood and Affect: Mood normal.        Behavior: Behavior normal.      LABORATORY DATA:  I have reviewed the labs as listed.  CBC    Component Value Date/Time   WBC 6.0 02/08/2019 0819   RBC 4.41 02/08/2019 0819   HGB 12.9 (L) 02/08/2019 0819   HCT 41.2 02/08/2019 0819   PLT 195 02/08/2019 0819   MCV 93.4 02/08/2019 0819   MCH 29.3 02/08/2019 0819   MCHC 31.3 02/08/2019 0819   RDW 14.1 02/08/2019 0819   LYMPHSABS 1.2 02/08/2019 0819   MONOABS 0.4 02/08/2019 0819   EOSABS 0.3 02/08/2019 0819   BASOSABS 0.0 02/08/2019 0819   CMP Latest Ref Rng & Units 02/08/2019 01/11/2019 12/13/2018  Glucose 70 - 99  mg/dL 109(H) 111(H) 117(H)  BUN 8 - 23 mg/dL 18 15 22   Creatinine 0.61 - 1.24 mg/dL 0.79 0.69 0.78  Sodium 135 - 145 mmol/L 141 140 140  Potassium 3.5 - 5.1 mmol/L 4.8 4.2 4.6  Chloride 98 - 111 mmol/L 112(H) 110 109  CO2 22 - 32 mmol/L 25 24 26   Calcium 8.9 - 10.3 mg/dL 9.0 8.6(L) 9.0  Total Protein 6.5 -  8.1 g/dL 7.0 6.4(L) 6.8  Total Bilirubin 0.3 - 1.2 mg/dL 0.4 0.8 0.7  Alkaline Phos 38 - 126 U/L 35(L) 32(L) 29(L)  AST 15 - 41 U/L 15 14(L) 15  ALT 0 - 44 U/L 13 13 14        DIAGNOSTIC IMAGING:  I have independently reviewed the scans and discussed with the patient.   I have reviewed Venita Lick LPN's note and agree with the documentation.  I personally performed a face-to-face visit, made revisions and my assessment and plan is as follows.    ASSESSMENT & PLAN:   Prostate cancer ([place]) 1.  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 -Enzalutamide 80 mg daily started on 02/08/2018, increased to 3 tablets daily, increased to full dose of 4 tablets daily in the first week of June. - He is tolerating enzalutamide 4 tablets daily very well. -He is receiving Lupron injections at Dr. Jethro Poling office once every 4 months.  He has a follow-up appointment next week. -We reviewed the results of PSA from last visit which has come down to 0.22.  -His constipation is well controlled with Dulcolax and MiraLAX.  - I will see him back in 2 months for follow-up with repeat PSA.  2.  Bone metastasis: -He has 1 metastatic site on fluciclovine PET CT scan on the right ilium. -Denosumab was started on 05/02/2018.  He is tolerating it very well.  Today calcium is 9. -She was told to continue calcium twice daily.    Total time spent is 25 minutes with more than 50% of the time spent face-to-face discussing treatment related side effects and coordination of care.  Orders placed this encounter:  Orders Placed This Encounter   Procedures  . CBC with Differential/Platelet  . Comprehensive metabolic panel  . PSA      Elijah Jack, MD [place] [phone]

## 2019-02-08 NOTE — Patient Instructions (Addendum)
[place] at [facility] Discharge Instructions  You were seen today by [name]. He went over your recent lab results, they looked good.  Do not take more than 4 tylenol a day. He will see you back in 2 months for labs and follow up.   Thank you for choosing [place] at [facility] to provide your oncology and hematology care.  To afford each patient quality time with our provider, please arrive at least 15 minutes before your scheduled appointment time.   If you have a lab appointment with the [place] please come in thru the  Main Entrance and check in at the main information desk  You need to re-schedule your appointment should you arrive 10 or more minutes late.  We strive to give you quality time with our providers, and arriving late affects you and other patients whose appointments are after yours.  Also, if you no show three or more times for appointments you may be dismissed from the clinic at the providers discretion.     Again, thank you for choosing [facility].  Our hope is that these requests will decrease the amount of time that you wait before being seen by our physicians.       _____________________________________________________________  Should you have questions after your visit to [facility], please contact our office at [phone] between the hours of 8:00 a.m. and 4:30 p.m.  Voicemails left after 4:00 p.m. will not be returned until the following business day.  For prescription refill requests, have your pharmacy contact our office and allow 72 hours.    Cancer Center Support Programs:   > Cancer Support Group  2nd Tuesday of the month 1pm-2pm, Journey Room

## 2019-02-21 ENCOUNTER — Telehealth (INDEPENDENT_AMBULATORY_CARE_PROVIDER_SITE_OTHER): Payer: Self-pay | Admitting: Internal Medicine

## 2019-02-21 NOTE — Telephone Encounter (Signed)
Patients daughter left message stating she would like to talk about the plan of care for her dad  That has metastatic prostrate cancer ph# [phone] [name])

## 2019-02-23 ENCOUNTER — Telehealth (INDEPENDENT_AMBULATORY_CARE_PROVIDER_SITE_OTHER): Payer: Self-pay | Admitting: Internal Medicine

## 2019-02-23 MED ORDER — MAGNESIUM CITRATE PO SOLN: 1.0000 | Freq: Once | ORAL | 2 refills | Status: AC

## 2019-02-23 MED ORDER — LINACLOTIDE 290 MCG PO CAPS: 290.0000 ug | ORAL_CAPSULE | Freq: Every day | ORAL | 3 refills | Status: DC

## 2019-02-23 NOTE — Telephone Encounter (Signed)
No answer at either number

## 2019-02-23 NOTE — Telephone Encounter (Signed)
I have talked with daughter.  Rx for Linzess sent to her pharmacy.([facility]). Also Rx for Mag Citrate. As needed

## 2019-02-23 NOTE — Telephone Encounter (Signed)
Rx sent to this pharmacy.

## 2019-02-25 ENCOUNTER — Other Ambulatory Visit (HOSPITAL_COMMUNITY): Payer: Self-pay | Admitting: Hematology

## 2019-02-25 DIAGNOSIS — C61 Malignant neoplasm of prostate: Secondary | ICD-10-CM

## 2019-03-06 ENCOUNTER — Other Ambulatory Visit: Payer: Self-pay | Admitting: Cardiology

## 2019-03-07 ENCOUNTER — Other Ambulatory Visit: Payer: Self-pay

## 2019-03-08 ENCOUNTER — Other Ambulatory Visit: Payer: Self-pay

## 2019-03-08 ENCOUNTER — Inpatient Hospital Stay (HOSPITAL_COMMUNITY): Payer: Medicare HMO | Attending: Hematology

## 2019-03-08 ENCOUNTER — Inpatient Hospital Stay (HOSPITAL_COMMUNITY): Payer: Medicare HMO

## 2019-03-08 VITALS — BP 123/63 | HR 66 | Temp 97.9°F | Resp 18

## 2019-03-08 DIAGNOSIS — C7951 Secondary malignant neoplasm of bone: Secondary | ICD-10-CM

## 2019-03-08 DIAGNOSIS — C61 Malignant neoplasm of prostate: Secondary | ICD-10-CM

## 2019-03-08 DIAGNOSIS — C779 Secondary and unspecified malignant neoplasm of lymph node, unspecified: Secondary | ICD-10-CM | POA: Insufficient documentation

## 2019-03-08 LAB — COMPREHENSIVE METABOLIC PANEL
ALT: 13 U/L (ref 0–44)
AST: 13 U/L — ABNORMAL LOW (ref 15–41)
Albumin: 3.8 g/dL (ref 3.5–5.0)
Alkaline Phosphatase: 30 U/L — ABNORMAL LOW (ref 38–126)
Anion gap: 5 (ref 5–15)
BUN: 20 mg/dL (ref 8–23)
CO2: 24 mmol/L (ref 22–32)
Calcium: 8.5 mg/dL — ABNORMAL LOW (ref 8.9–10.3)
Chloride: 110 mmol/L (ref 98–111)
Creatinine, Ser: 0.82 mg/dL (ref 0.61–1.24)
GFR calc Af Amer: >60 mL/min (ref >60)
GFR calc non Af Amer: >60 mL/min (ref >60)
Glucose, Bld: 115 mg/dL — ABNORMAL HIGH (ref 70–99)
Potassium: 4.9 mmol/L (ref 3.5–5.1)
Sodium: 139 mmol/L (ref 135–145)
Total Bilirubin: 0.5 mg/dL (ref 0.3–1.2)
Total Protein: 6.6 g/dL (ref 6.5–8.1)

## 2019-03-08 MED ORDER — DENOSUMAB 120 MG/1.7ML ~~LOC~~ SOLN
120.0000 mg | Freq: Once | SUBCUTANEOUS | Status: AC
  Administered 2019-03-08: 120 mg via SUBCUTANEOUS

## 2019-03-08 MED ORDER — DENOSUMAB 120 MG/1.7ML ~~LOC~~ SOLN
SUBCUTANEOUS | Status: AC
  Filled 2019-03-08: qty 1.7

## 2019-03-08 NOTE — Progress Notes (Signed)
Vaughan Basta presents today for injection per MD orders. XGeva administered SQ in right Upper Arm. Administration without incident. Patient tolerated well.   No complaints at this time. Discharged from clinic ambulatory. F/U with [facility] as scheduled.

## 2019-03-08 NOTE — Patient Instructions (Signed)
[facility]  Discharge Instructions:   _______________________________________________________________  Thank you for choosing [facility] at [facility] to provide your oncology and hematology care.  To afford each patient quality time with our providers, please arrive at least 15 minutes before your scheduled appointment.  You need to re-schedule your appointment if you arrive 10 or more minutes late.  We strive to give you quality time with our providers, and arriving late affects you and other patients whose appointments are after yours.  Also, if you no show three or more times for appointments you may be dismissed from the clinic.  Again, thank you for choosing [facility] at [facility]. Our hope is that these requests will allow you access to exceptional care and in a timely manner. _______________________________________________________________  If you have questions after your visit, please contact our office at [phone] between the hours of 8:30 a.m. and 5:00 p.m. Voicemails left after 4:30 p.m. will not be returned until the following business day. _______________________________________________________________  For prescription refill requests, have your pharmacy contact our office. _______________________________________________________________  Recommendations made by the consultant and any test results will be sent to your referring physician. _______________________________________________________________ 

## 2019-03-10 MED FILL — XTANDI 40 MG CAPSULE: 40 | 30 days supply | Qty: 120 | Fill #0

## 2019-03-14 ENCOUNTER — Ambulatory Visit (INDEPENDENT_AMBULATORY_CARE_PROVIDER_SITE_OTHER): Payer: Medicare HMO | Admitting: Urology

## 2019-03-14 DIAGNOSIS — R351 Nocturia: Secondary | ICD-10-CM | POA: Diagnosis not present

## 2019-03-14 DIAGNOSIS — C61 Malignant neoplasm of prostate: Secondary | ICD-10-CM

## 2019-03-14 DIAGNOSIS — N401 Enlarged prostate with lower urinary tract symptoms: Secondary | ICD-10-CM

## 2019-03-14 DIAGNOSIS — M159 Polyosteoarthritis, unspecified: Secondary | ICD-10-CM | POA: Diagnosis not present

## 2019-03-14 DIAGNOSIS — C7951 Secondary malignant neoplasm of bone: Secondary | ICD-10-CM | POA: Diagnosis not present

## 2019-03-14 DIAGNOSIS — R9721 Rising PSA following treatment for malignant neoplasm of prostate: Secondary | ICD-10-CM | POA: Diagnosis not present

## 2019-03-14 DIAGNOSIS — I10 Essential (primary) hypertension: Secondary | ICD-10-CM | POA: Diagnosis not present

## 2019-03-14 DIAGNOSIS — Z192 Hormone resistant malignancy status: Secondary | ICD-10-CM | POA: Diagnosis not present

## 2019-03-14 DIAGNOSIS — I2510 Atherosclerotic heart disease of native coronary artery without angina pectoris: Secondary | ICD-10-CM | POA: Diagnosis not present

## 2019-03-14 DIAGNOSIS — R3914 Feeling of incomplete bladder emptying: Secondary | ICD-10-CM | POA: Diagnosis not present

## 2019-03-22 ENCOUNTER — Other Ambulatory Visit: Payer: Self-pay | Admitting: Urology

## 2019-03-22 DIAGNOSIS — N35012 Post-traumatic membranous urethral stricture: Secondary | ICD-10-CM | POA: Diagnosis not present

## 2019-03-22 DIAGNOSIS — C61 Malignant neoplasm of prostate: Secondary | ICD-10-CM | POA: Diagnosis not present

## 2019-03-22 DIAGNOSIS — R3914 Feeling of incomplete bladder emptying: Secondary | ICD-10-CM | POA: Diagnosis not present

## 2019-03-23 ENCOUNTER — Encounter (HOSPITAL_COMMUNITY)
Admission: RE | Admit: 2019-03-23 | Discharge: 2019-03-23 | Disposition: A | Discharge status: Home / Self Care | Payer: Medicare HMO | Source: Ambulatory Visit | Attending: Urology | Admitting: Urology

## 2019-03-23 ENCOUNTER — Encounter (HOSPITAL_COMMUNITY): Payer: Self-pay

## 2019-03-23 ENCOUNTER — Other Ambulatory Visit: Payer: Self-pay

## 2019-03-23 ENCOUNTER — Other Ambulatory Visit: Payer: Self-pay | Admitting: Urology

## 2019-03-23 ENCOUNTER — Emergency Department (HOSPITAL_COMMUNITY): Payer: Medicare HMO

## 2019-03-23 ENCOUNTER — Inpatient Hospital Stay (HOSPITAL_COMMUNITY)
Admission: EM | Admit: 2019-03-23 | Discharge: 2019-03-25 | DRG: 690 | Disposition: A | Discharge status: Home Health | Payer: Medicare HMO | Attending: Family Medicine | Admitting: Family Medicine

## 2019-03-23 DIAGNOSIS — Z87891 Personal history of nicotine dependence: Secondary | ICD-10-CM | POA: Diagnosis not present

## 2019-03-23 DIAGNOSIS — R140 Abdominal distension (gaseous): Secondary | ICD-10-CM | POA: Diagnosis not present

## 2019-03-23 DIAGNOSIS — Z7902 Long term (current) use of antithrombotics/antiplatelets: Secondary | ICD-10-CM | POA: Diagnosis not present

## 2019-03-23 DIAGNOSIS — Z923 Personal history of irradiation: Secondary | ICD-10-CM | POA: Diagnosis not present

## 2019-03-23 DIAGNOSIS — N136 Pyonephrosis: Principal | ICD-10-CM | POA: Diagnosis present

## 2019-03-23 DIAGNOSIS — I480 Paroxysmal atrial fibrillation: Secondary | ICD-10-CM | POA: Diagnosis present

## 2019-03-23 DIAGNOSIS — Z8673 Personal history of transient ischemic attack (TIA), and cerebral infarction without residual deficits: Secondary | ICD-10-CM

## 2019-03-23 DIAGNOSIS — Z79899 Other long term (current) drug therapy: Secondary | ICD-10-CM

## 2019-03-23 DIAGNOSIS — N320 Bladder-neck obstruction: Secondary | ICD-10-CM | POA: Diagnosis present

## 2019-03-23 DIAGNOSIS — N4289 Other specified disorders of prostate: Secondary | ICD-10-CM | POA: Diagnosis present

## 2019-03-23 DIAGNOSIS — C61 Malignant neoplasm of prostate: Secondary | ICD-10-CM | POA: Diagnosis not present

## 2019-03-23 DIAGNOSIS — I255 Ischemic cardiomyopathy: Secondary | ICD-10-CM | POA: Diagnosis present

## 2019-03-23 DIAGNOSIS — C7951 Secondary malignant neoplasm of bone: Secondary | ICD-10-CM | POA: Diagnosis present

## 2019-03-23 DIAGNOSIS — N35819 Other urethral stricture, male, unspecified site: Secondary | ICD-10-CM | POA: Diagnosis not present

## 2019-03-23 DIAGNOSIS — K219 Gastro-esophageal reflux disease without esophagitis: Secondary | ICD-10-CM | POA: Diagnosis present

## 2019-03-23 DIAGNOSIS — R338 Other retention of urine: Secondary | ICD-10-CM | POA: Diagnosis not present

## 2019-03-23 DIAGNOSIS — R339 Retention of urine, unspecified: Secondary | ICD-10-CM | POA: Diagnosis present

## 2019-03-23 DIAGNOSIS — R319 Hematuria, unspecified: Secondary | ICD-10-CM | POA: Diagnosis present

## 2019-03-23 DIAGNOSIS — I160 Hypertensive urgency: Secondary | ICD-10-CM | POA: Diagnosis present

## 2019-03-23 DIAGNOSIS — N390 Urinary tract infection, site not specified: Secondary | ICD-10-CM | POA: Diagnosis present

## 2019-03-23 DIAGNOSIS — N401 Enlarged prostate with lower urinary tract symptoms: Secondary | ICD-10-CM | POA: Diagnosis present

## 2019-03-23 DIAGNOSIS — Z6833 Body mass index (BMI) 33.0-33.9, adult: Secondary | ICD-10-CM

## 2019-03-23 DIAGNOSIS — N35919 Unspecified urethral stricture, male, unspecified site: Secondary | ICD-10-CM | POA: Diagnosis not present

## 2019-03-23 DIAGNOSIS — N1330 Unspecified hydronephrosis: Secondary | ICD-10-CM

## 2019-03-23 DIAGNOSIS — Z955 Presence of coronary angioplasty implant and graft: Secondary | ICD-10-CM

## 2019-03-23 DIAGNOSIS — I10 Essential (primary) hypertension: Secondary | ICD-10-CM | POA: Diagnosis present

## 2019-03-23 DIAGNOSIS — Z7901 Long term (current) use of anticoagulants: Secondary | ICD-10-CM | POA: Diagnosis not present

## 2019-03-23 DIAGNOSIS — E669 Obesity, unspecified: Secondary | ICD-10-CM | POA: Diagnosis present

## 2019-03-23 DIAGNOSIS — E876 Hypokalemia: Secondary | ICD-10-CM | POA: Diagnosis present

## 2019-03-23 DIAGNOSIS — Z951 Presence of aortocoronary bypass graft: Secondary | ICD-10-CM

## 2019-03-23 DIAGNOSIS — I2510 Atherosclerotic heart disease of native coronary artery without angina pectoris: Secondary | ICD-10-CM | POA: Diagnosis present

## 2019-03-23 DIAGNOSIS — I252 Old myocardial infarction: Secondary | ICD-10-CM | POA: Diagnosis not present

## 2019-03-23 DIAGNOSIS — Z79818 Long term (current) use of other agents affecting estrogen receptors and estrogen levels: Secondary | ICD-10-CM

## 2019-03-23 DIAGNOSIS — Z9079 Acquired absence of other genital organ(s): Secondary | ICD-10-CM

## 2019-03-23 DIAGNOSIS — Z1159 Encounter for screening for other viral diseases: Secondary | ICD-10-CM | POA: Diagnosis not present

## 2019-03-23 DIAGNOSIS — H9190 Unspecified hearing loss, unspecified ear: Secondary | ICD-10-CM | POA: Diagnosis present

## 2019-03-23 DIAGNOSIS — N3289 Other specified disorders of bladder: Secondary | ICD-10-CM | POA: Diagnosis not present

## 2019-03-23 DIAGNOSIS — N39490 Overflow incontinence: Secondary | ICD-10-CM | POA: Diagnosis present

## 2019-03-23 DIAGNOSIS — N99114 Postprocedural urethral stricture, male, unspecified: Secondary | ICD-10-CM | POA: Diagnosis not present

## 2019-03-23 DIAGNOSIS — D649 Anemia, unspecified: Secondary | ICD-10-CM | POA: Diagnosis present

## 2019-03-23 DIAGNOSIS — I6782 Cerebral ischemia: Secondary | ICD-10-CM | POA: Diagnosis not present

## 2019-03-23 DIAGNOSIS — E782 Mixed hyperlipidemia: Secondary | ICD-10-CM | POA: Diagnosis present

## 2019-03-23 HISTORY — DX: Paroxysmal atrial fibrillation: I48.0

## 2019-03-23 NOTE — ED Provider Notes (Signed)
[facility] EMERGENCY DEPARTMENT Provider Note   CSN: [id number] Arrival date & time: 03/23/19  2241    History   Chief Complaint Chief Complaint  Patient presents with  . Hypertension    HPI Elijah Santana is a 85 y.o. male.     Level 5 caveat for hearing deficit patient presents from home with elevated blood pressure reading of 208/104.  States his blood pressure has been elevated for several days and has been having intermittent feelings of chills and lightheadedness which she thinks could be related to his prostate medication.  He saw his urologist 2 days ago for recheck of his prostate cancer and told that he could need to have a surgery coming up soon.  His blood pressure was high at that time as well.  Patient reports compliance with his blood pressure regiment which includes amlodipine, doxazosin and Lopressor.  Denies any recent medication changes.  He denies any chest pain, shortness of breath, dizziness, headache or visual changes.  Does report progressively worsening abdominal distention and pain for about the past 1 month.  States he had a small bowel movement yesterday but cannot tell if this been regular or not.  No vomiting.  States he is been urinating as his usual has not had a catheter for several years.  He denies any focal weakness, numbness or tingling.  No difficulty speaking or difficulty swallowing.  The history is provided by the patient.  Hypertension  Associated symptoms include abdominal pain. Pertinent negatives include no chest pain, no headaches and no shortness of breath.    Past Medical History:  Diagnosis Date  . Arthritis   . Asthma    Childhood  . Chronic back pain   . Coronary atherosclerosis of native coronary artery    a. CABG 2004 with LIMA-LAD, SVG-D1, SVG-OM, SVG-PDA. b. Cath ~2010 with occ of SVG-diagonal, distal LAD and OM filiing by collaterals. c. NSTEMI 12/2014 s/p DES to SVG-RCA. d. 11/2015: DES to distal graft of the SVG-distal RCA  .  Essential hypertension   . Foley catheter in place   . GERD (gastroesophageal reflux disease)   . Hard of hearing   . Hyperglycemia   . Ischemic cardiomyopathy    a. EF 45% by cath 12/2014.  . Mixed hyperlipidemia   . NSTEMI (non-ST elevated myocardial infarction) ([place]) 01/01/15  . Pneumonia    hx of   . Prostate cancer [facility])    Radiation therapy 1996  . Skin cancer   . Stroke [facility])    TIA- 28 years ago     Patient Active Problem List   Diagnosis Date Noted  . Bone metastasis ([place]) 04/08/2018  . Prostate cancer ([place]) 11/27/2017  . Atrial fibrillation, rapid -new 09/10/16 09/10/2016  . PAF- in setting of NSTEMI 09/10/2016  . Accelerating angina ([place])   . CAD S/P percutaneous coronary angioplasty   . Coronary artery disease with hx of myocardial infarct w/o hx of CABG 03/21/2015  . Ischemic cardiomyopathy   . Hyperglycemia   . Essential hypertension   . BPH (benign prostatic hyperplasia)   . NSTEMI- Troponin peak 6/47 12/31/2014  . S/P CABG x 4 2004 12/31/2014  . GERD (gastroesophageal reflux disease) 04/29/2012  . Mixed hyperlipidemia   . Essential hypertension, benign 08/06/2009    Past Surgical History:  Procedure Laterality Date  . CARDIAC CATHETERIZATION  01/01/2015   Procedure: CORONARY STENT INTERVENTION;  Surgeon: Peter M Martinique, MD;  Location: [facility] CATH LAB;  Service: Cardiovascular;;  SVG  to PDA  . CARDIAC CATHETERIZATION N/A 11/21/2015   Procedure: Left Heart Cath and Cors/Grafts Angiography;  Surgeon: Troy Sine, MD;  Location: [place] CV LAB;  Service: Cardiovascular;  Laterality: N/A;  . CARDIAC CATHETERIZATION N/A 11/21/2015   Procedure: Coronary Stent Intervention;  Surgeon: Troy Sine, MD;  Location: [place] CV LAB;  Service: Cardiovascular;  Laterality: N/A;  . CARDIAC CATHETERIZATION N/A 09/11/2016   Procedure: Left Heart Cath and Cors/Grafts Angiography;  Surgeon: Leonie Man, MD;  Location: [place] CV LAB;  Service: Cardiovascular;   Laterality: N/A;  . CARDIAC CATHETERIZATION N/A 09/11/2016   Procedure: Coronary Stent Intervention;  Surgeon: Leonie Man, MD;  Location: [place] CV LAB;  Service: Cardiovascular;  Laterality: N/A;  . CORONARY ARTERY BYPASS GRAFT  2004   LIMA to LAD, SVG to diagonal, SVG to circumflex, SVG to PDA  . GREEN LIGHT LASER TURP (TRANSURETHRAL RESECTION OF PROSTATE N/A 06/04/2016   Procedure: GREEN LIGHT LASER TURP (TRANSURETHRAL RESECTION OF PROSTATE;  Surgeon: Irine Seal, MD;  Location: [facility] ORS;  Service: Urology;  Laterality: N/A;  . LEFT HEART CATHETERIZATION WITH CORONARY/GRAFT ANGIOGRAM N/A 01/01/2015   Procedure: LEFT HEART CATHETERIZATION WITH Beatrix Fetters;  Surgeon: Peter M Martinique, MD;  Location: [facility] CATH LAB;  Service: Cardiovascular;  Laterality: N/A;  . PERCUTANEOUS CORONARY STENT INTERVENTION (PCI-S)  11/20/2015   distal SVG  with DES       . TONSILLECTOMY          Home Medications    Prior to Admission medications   Medication Sig Start Date End Date Taking? Authorizing Provider  acetaminophen (TYLENOL) 325 MG tablet Take 2 tablets (650 mg total) by mouth every 4 (four) hours as needed for headache or mild pain. 09/12/16   Erlene Quan, PA-C  amLODipine (NORVASC) 5 MG tablet TAKE ONE TABLET BY MOUTH DAILY (MORNING) 11/22/17   Satira Sark, MD  apixaban (ELIQUIS) 5 MG TABS tablet TAKE ONE TABLET BY MOUTH TWICE DAILY (MORNING ,EVENING) 12/28/18   Satira Sark, MD  bisacodyl (DULCOLAX) 5 MG EC tablet Take 5-10 mg by mouth daily as needed (constipation). Reported on 05/28/2016    [provider]  calcium carbonate (OS-CAL - DOSED IN MG OF ELEMENTAL CALCIUM) 1250 (500 Ca) MG tablet Take 1 tablet by mouth.    [provider]  clopidogrel (PLAVIX) 75 MG tablet TAKE ONE TABLET BY MOUTH EVERY DAY (,EVENING) 03/06/19   Satira Sark, MD  doxazosin (CARDURA) 4 MG tablet TAKE ONE TABLET BY MOUTH DAILY. (,EVENING) 12/28/18   Satira Sark, MD   isosorbide mononitrate (IMDUR) 60 MG 24 hr tablet TAKE ONE TABLET BY MOUTH DAILY (MORNING) 03/06/19   Satira Sark, MD  ketoconazole (NIZORAL) 2 % cream APPLY TWICE DAILY TO THE BOTTOMS OF BOTH FEET. 12/29/18   [provider]  linaclotide (LINZESS) 290 MCG CAPS capsule Take 1 capsule (290 mcg total) by mouth daily before breakfast. 02/23/19   Setzer, Rona Ravens, NP  magnesium citrate SOLN TAKE ONE BOTTLE BY MOUTH ONCE FOR ONE DOSE. 02/23/19   [provider]  metoprolol tartrate (LOPRESSOR) 50 MG tablet TAKE ONE TABLET BY MOUTH TWICE DAILY (MORNING ,EVENING) 11/17/18   Satira Sark, MD  nitroGLYCERIN (NITROSTAT) 0.4 MG SL tablet PLACE ONE (1) TABLET UNDER TONGUE EVERY 5 MINUTES UP TO (3) DOSES AS NEEDED FOR CHEST PAIN. 03/16/17   Satira Sark, MD  pantoprazole (PROTONIX) 40 MG tablet Take 1 tablet (40  mg total) by mouth daily. 12/01/16   Satira Sark, MD  PRESCRIPTION MEDICATION Hormone shots done at Dr. Ralene Muskrat office once every 4 months (for prostrate) - last injection mid December 2016    [provider]  rosuvastatin (CRESTOR) 20 MG tablet TAKE ONE TABLET BY MOUTH DAILY (EVENING) 03/06/19   Satira Sark, MD  STOOL SOFTENER 100 MG capsule TAKE 1 OR 2 CAPSULES BY MOUTH DAILY AS NEEDED. 01/02/19   [provider]  tamsulosin (FLOMAX) 0.4 MG CAPS capsule TAKE ONE CAPSULE BY MOUTH TWICE DAILY. (MORNING ,EVENING) 03/14/18   [provider]  traMADol (ULTRAM) 50 MG tablet Take 0.5 tablets (25 mg total) by mouth daily as needed. 12/01/18   Lockamy, Randi L, NP-C  XTANDI 40 MG capsule TAKE 4 CAPSULES (160 MG TOTAL) BY MOUTH DAILY. 02/27/19   Derek Jack, MD    Family History Family History  Problem Relation Age of Onset  . Hypertension Father   . Coronary artery disease Father     Social History Social History   Tobacco Use  . Smoking status: Former Smoker    Packs/day: 0.50    Years: 27.00    Pack years: 13.50    Types:  Cigars    Start date: 07/28/1964    Last attempt to quit: 07/28/1986    Years since quitting: 32.6  . Smokeless tobacco: Former Systems developer    Types: Chew    Quit date: 08/07/2010  . Tobacco comment: never chewed up over a pack/day  Substance Use Topics  . Alcohol use: No    Alcohol/week: 0.0 standard drinks  . Drug use: No     Allergies   Patient has no known allergies.   Review of Systems Review of Systems  Constitutional: Positive for fatigue. Negative for activity change and appetite change.  HENT: Negative for congestion and rhinorrhea.   Respiratory: Negative for cough, chest tightness and shortness of breath.   Cardiovascular: Negative for chest pain.  Gastrointestinal: Positive for abdominal distention and abdominal pain. Negative for nausea and vomiting.  Genitourinary: Positive for difficulty urinating.  Musculoskeletal: Positive for back pain. Negative for arthralgias and myalgias.  Skin: Negative for rash.  Neurological: Positive for weakness and light-headedness. Negative for dizziness and headaches.    all other systems are negative except as noted in the HPI and PMH.    Physical Exam Updated Vital Signs BP (!) 191/106 (BP Location: Right Arm)   Pulse 71   Temp 98.2 F (36.8 C) (Oral)   Resp 18   Ht 5\' 9"  (1.753 m)   Wt 95.3 kg   SpO2 98%   BMI 31.01 kg/m   Physical Exam Vitals signs and nursing note reviewed.  Constitutional:      General: He is not in acute distress.    Appearance: He is well-developed.     Comments: Hard of hearing  HENT:     Head: Normocephalic and atraumatic.     Mouth/Throat:     Pharynx: No oropharyngeal exudate.  Eyes:     Conjunctiva/sclera: Conjunctivae normal.     Pupils: Pupils are equal, round, and reactive to light.  Neck:     Musculoskeletal: Normal range of motion and neck supple.     Comments: No meningismus. Cardiovascular:     Rate and Rhythm: Normal rate and regular rhythm.     Heart sounds: Normal heart sounds.  No murmur.  Pulmonary:     Effort: Pulmonary effort is normal. No respiratory distress.  Breath sounds: Normal breath sounds.  Abdominal:     General: There is distension.     Palpations: Abdomen is soft.     Tenderness: There is abdominal tenderness. There is no guarding or rebound.     Comments: Mild diffuse tenderness with distention, no guarding or rebound  Musculoskeletal: Normal range of motion.        General: No tenderness.  Skin:    General: Skin is warm.     Capillary Refill: Capillary refill takes less than 2 seconds.  Neurological:     General: No focal deficit present.     Mental Status: He is alert and oriented to person, place, and time. Mental status is at baseline.     Cranial Nerves: No cranial nerve deficit.     Motor: No abnormal muscle tone.     Coordination: Coordination normal.     Comments: CN 2-12 intact, no ataxia on finger to nose, no nystagmus, 5/5 strength throughout, no pronator drift, Romberg negative, normal gait.  No nystagmus,  Psychiatric:        Behavior: Behavior normal.      ED Treatments / Results  Labs (all labs ordered are listed, but only abnormal results are displayed) Labs Reviewed  CBC WITH DIFFERENTIAL/PLATELET - Abnormal; Notable for the following components:      Result Value   RBC 3.89 (*)    Hemoglobin 11.6 (*)    HCT 36.7 (*)    All other components within normal limits  COMPREHENSIVE METABOLIC PANEL - Abnormal; Notable for the following components:   Chloride 112 (*)    Glucose, Bld 100 (*)    Calcium 8.4 (*)    Alkaline Phosphatase 34 (*)    All other components within normal limits  URINALYSIS, ROUTINE W REFLEX MICROSCOPIC - Abnormal; Notable for the following components:   Hgb urine dipstick MODERATE (*)    Leukocytes,Ua TRACE (*)    Bacteria, UA RARE (*)    All other components within normal limits  URINE CULTURE  SARS CORONAVIRUS 2 (HOSPITAL ORDER, [place] [facility])  LIPASE, BLOOD   TROPONIN I    EKG EKG Interpretation  Date/Time:  Thursday Mar 23 2019 23:37:47 EDT Ventricular Rate:  65 PR Interval:    QRS Duration: 88 QT Interval:  414 QTC Calculation: 431 R Axis:   -2 Text Interpretation:  Sinus rhythm Baseline wander in lead(s) V4 V6 No significant change was found Confirmed by Ezequiel Essex [phone]) on 03/23/2019 11:48:17 PM   Radiology Ct Head Wo Contrast  Result Date: 03/24/2019 CLINICAL DATA:  85 y/o  M; Vertigo, episodic, peripheral. EXAM: CT HEAD WITHOUT CONTRAST TECHNIQUE: Contiguous axial images were obtained from the base of the skull through the vertex without intravenous contrast. COMPARISON:  None. FINDINGS: Brain: No evidence of acute infarction, hemorrhage, hydrocephalus, extra-axial collection or mass lesion/mass effect. Nonspecific white matter hypodensities are compatible with chronic microvascular ischemic changes and there is volume loss brain. Partially empty sella turcica. Vascular: Calcific atherosclerosis of carotid siphons and the vertebral arteries. No hyperdense vessel identified. Skull: Normal. Negative for fracture or focal lesion. Sinuses/Orbits: No acute finding. Other: None. IMPRESSION: 1. No acute intracranial abnormality identified. 2. Chronic microvascular ischemic changes and volume loss of the brain. Electronically Signed   By: Kristine Garbe M.D.   On: 03/24/2019 01:21   Ct Abdomen Pelvis W Contrast  Result Date: 03/24/2019 CLINICAL DATA:  Abdominal distension EXAM: CT ABDOMEN AND PELVIS WITH CONTRAST TECHNIQUE: Multidetector CT imaging  of the abdomen and pelvis was performed using the standard protocol following bolus administration of intravenous contrast. CONTRAST:  140mL OMNIPAQUE IOHEXOL 300 MG/ML  SOLN COMPARISON:  12/07/2017 PET CT FINDINGS: Lower chest: No acute abnormality Hepatobiliary: No focal hepatic abnormality. Gallbladder unremarkable. Pancreas: No focal abnormality or ductal dilatation. Spleen: No focal  abnormality.  Normal size. Adrenals/Urinary Tract: Severe bilateral hydronephrosis, right greater than left. The urinary bladder is distended above the level of the umbilicus. Severely trabeculated bladder walls. Adrenal glands unremarkable. Stomach/Bowel: Sigmoid diverticulosis. No active diverticulitis. Stomach and small bowel decompressed, unremarkable. Vascular/Lymphatic: Aortic atherosclerosis. No enlarged abdominal or pelvic lymph nodes. Reproductive: No visible focal abnormality. Other: No free fluid or free air. Musculoskeletal: Sclerotic focus within the right iliac bone measures 10 mm. Sclerotic focus in the anterior T12 vertebral body measures 10 mm. These have enlarged slightly since prior study. IMPRESSION: Severe bilateral hydronephrosis. Urinary bladder is distended and severely trabeculated. 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 Aortic atherosclerosis. Electronically Signed   By: Rolm Baptise M.D.   On: 03/24/2019 01:21    Procedures Procedures (including critical care time)  Medications Ordered in ED Medications - No data to display   Initial Impression / Assessment and Plan / ED Course  I have reviewed the triage vital signs and the nursing notes.  Pertinent labs & imaging results that were available during my care of the patient were reviewed by me and considered in my medical decision making (see chart for details).       Elevated blood pressure at home with abdominal distention and history of prostate cancer.  No chest pain.  EKG unchanged.  Question whether patient's abdominal distention and hypertension could be related to urinary retention.  Will check bladder scan.  Bladder scan greater than 1 L.  Will attempt Foley catheter placement.  Patient's urological history is unclear.  He is scheduled for a cystoscopy with dilation of stricture on May 12 and Dr. Jeffie Pollock notes possible  suprapubic tube.  Hematuria noted.  Patient does take Eliquis.  His labs are reassuring with stable creatinine and hemoglobin.  Foley catheter placement was attempted and unsuccessful by multiple nurses.  Patient reports urologist had difficulty in the office as well. Discussed with Dr. Junious Silk of urology who will review his office notes.  Discussed with Dr. Junious Silk.  He reviewed patient's outpatient records.  He reports previous imaging has shown a distended bladder and appears patient has some chronic element of urinary retention. He states patient has had difficult anatomy and inability to pass catheter in the office as well.  He recommends suprapubic placement with IR at [facility] or [facility].  He does not feel patient's placement will be successful in the ED or in the office.  CT shows severe hydronephrosis with distended bladder.  Creatinine is however stable.  Patient agreeable to admission for suprapubic tube placement in the morning. We will continue to treat pain and treat hypertensive urgency.  Patient agreeable to transfer to [facility].  Discussed with Dr. Maudie Mercury.  CRITICAL CARE Performed by: Ezequiel Essex Total critical care time: 34 minutes Critical care time was exclusive of separately billable procedures and treating other patients. Critical care was necessary to treat or prevent imminent or life-threatening deterioration. Critical care was time spent personally by me on the following activities: development of treatment plan with patient and/or surrogate as well as nursing, discussions with consultants,  evaluation of patient's response to treatment, examination of patient, obtaining history from patient or surrogate, ordering and performing treatments and interventions, ordering and review of laboratory studies, ordering and review of radiographic studies, pulse oximetry and re-evaluation of patient's condition.   Final Clinical Impressions(s) / ED  Diagnoses   Final diagnoses:  Hypertensive urgency  Urinary retention    ED Discharge Orders    None       Phelicia Dantes, Annie Main, MD 03/24/19 [phone]

## 2019-03-23 NOTE — ED Triage Notes (Signed)
Pt reports checking his BP at home and it read 208/104 and pt got worried and came to be checked out. Pt saw urologist yesterday and noted that his BP was high, but did not change any medications.  Pt is followed by a cardiologist- Dr Domenic Polite- pt has appt in about a month. Pt reports some lower back pain and general aches and pains. Denies chest pain, SOB, or dizziness' but does reports abdominal distension that has been going on for a month along with increasing BP readings.

## 2019-03-24 ENCOUNTER — Observation Stay (HOSPITAL_COMMUNITY): Payer: Medicare HMO

## 2019-03-24 ENCOUNTER — Encounter (HOSPITAL_COMMUNITY): Payer: Self-pay | Admitting: Internal Medicine

## 2019-03-24 DIAGNOSIS — Z1159 Encounter for screening for other viral diseases: Secondary | ICD-10-CM | POA: Diagnosis not present

## 2019-03-24 DIAGNOSIS — R319 Hematuria, unspecified: Secondary | ICD-10-CM | POA: Diagnosis present

## 2019-03-24 DIAGNOSIS — R339 Retention of urine, unspecified: Secondary | ICD-10-CM | POA: Diagnosis present

## 2019-03-24 DIAGNOSIS — I252 Old myocardial infarction: Secondary | ICD-10-CM | POA: Diagnosis not present

## 2019-03-24 DIAGNOSIS — N1330 Unspecified hydronephrosis: Secondary | ICD-10-CM | POA: Diagnosis not present

## 2019-03-24 DIAGNOSIS — E669 Obesity, unspecified: Secondary | ICD-10-CM | POA: Diagnosis present

## 2019-03-24 DIAGNOSIS — C61 Malignant neoplasm of prostate: Secondary | ICD-10-CM

## 2019-03-24 DIAGNOSIS — I10 Essential (primary) hypertension: Secondary | ICD-10-CM | POA: Diagnosis present

## 2019-03-24 DIAGNOSIS — N390 Urinary tract infection, site not specified: Secondary | ICD-10-CM

## 2019-03-24 DIAGNOSIS — N136 Pyonephrosis: Secondary | ICD-10-CM | POA: Diagnosis present

## 2019-03-24 DIAGNOSIS — D649 Anemia, unspecified: Secondary | ICD-10-CM | POA: Diagnosis present

## 2019-03-24 DIAGNOSIS — I480 Paroxysmal atrial fibrillation: Secondary | ICD-10-CM | POA: Diagnosis present

## 2019-03-24 DIAGNOSIS — N320 Bladder-neck obstruction: Secondary | ICD-10-CM | POA: Diagnosis not present

## 2019-03-24 DIAGNOSIS — Z87891 Personal history of nicotine dependence: Secondary | ICD-10-CM | POA: Diagnosis not present

## 2019-03-24 DIAGNOSIS — Z923 Personal history of irradiation: Secondary | ICD-10-CM | POA: Diagnosis not present

## 2019-03-24 DIAGNOSIS — C7951 Secondary malignant neoplasm of bone: Secondary | ICD-10-CM | POA: Diagnosis present

## 2019-03-24 DIAGNOSIS — I160 Hypertensive urgency: Secondary | ICD-10-CM | POA: Diagnosis present

## 2019-03-24 DIAGNOSIS — Z79899 Other long term (current) drug therapy: Secondary | ICD-10-CM | POA: Diagnosis not present

## 2019-03-24 DIAGNOSIS — H9190 Unspecified hearing loss, unspecified ear: Secondary | ICD-10-CM | POA: Diagnosis present

## 2019-03-24 DIAGNOSIS — Z955 Presence of coronary angioplasty implant and graft: Secondary | ICD-10-CM | POA: Diagnosis not present

## 2019-03-24 DIAGNOSIS — Z951 Presence of aortocoronary bypass graft: Secondary | ICD-10-CM | POA: Diagnosis not present

## 2019-03-24 DIAGNOSIS — I2510 Atherosclerotic heart disease of native coronary artery without angina pectoris: Secondary | ICD-10-CM | POA: Diagnosis present

## 2019-03-24 DIAGNOSIS — E782 Mixed hyperlipidemia: Secondary | ICD-10-CM | POA: Diagnosis present

## 2019-03-24 DIAGNOSIS — I255 Ischemic cardiomyopathy: Secondary | ICD-10-CM | POA: Diagnosis present

## 2019-03-24 DIAGNOSIS — Z8673 Personal history of transient ischemic attack (TIA), and cerebral infarction without residual deficits: Secondary | ICD-10-CM | POA: Diagnosis not present

## 2019-03-24 DIAGNOSIS — K219 Gastro-esophageal reflux disease without esophagitis: Secondary | ICD-10-CM | POA: Diagnosis present

## 2019-03-24 DIAGNOSIS — Z7902 Long term (current) use of antithrombotics/antiplatelets: Secondary | ICD-10-CM | POA: Diagnosis not present

## 2019-03-24 DIAGNOSIS — N401 Enlarged prostate with lower urinary tract symptoms: Secondary | ICD-10-CM | POA: Diagnosis present

## 2019-03-24 DIAGNOSIS — Z6833 Body mass index (BMI) 33.0-33.9, adult: Secondary | ICD-10-CM | POA: Diagnosis not present

## 2019-03-24 DIAGNOSIS — Z7901 Long term (current) use of anticoagulants: Secondary | ICD-10-CM | POA: Diagnosis not present

## 2019-03-24 LAB — CBC WITH DIFFERENTIAL/PLATELET
Abs Immature Granulocytes: 0.01 10*3/uL (ref 0.00–0.07)
Basophils Absolute: 0 10*3/uL (ref 0.0–0.1)
Basophils Relative: 1 %
Eosinophils Absolute: 0.3 10*3/uL (ref 0.0–0.5)
Eosinophils Relative: 5 %
HCT: 36.7 % — ABNORMAL LOW (ref 39.0–52.0)
Hemoglobin: 11.6 g/dL — ABNORMAL LOW (ref 13.0–17.0)
Immature Granulocytes: 0 %
Lymphocytes Relative: 21 %
Lymphs Abs: 1.3 10*3/uL (ref 0.7–4.0)
MCH: 29.8 pg (ref 26.0–34.0)
MCHC: 31.6 g/dL (ref 30.0–36.0)
MCV: 94.3 fL (ref 80.0–100.0)
Monocytes Absolute: 0.5 10*3/uL (ref 0.1–1.0)
Monocytes Relative: 8 %
Neutro Abs: 4.1 10*3/uL (ref 1.7–7.7)
Neutrophils Relative %: 65 %
Platelets: 186 10*3/uL (ref 150–400)
RBC: 3.89 MIL/uL — ABNORMAL LOW (ref 4.22–5.81)
RDW: 14 % (ref 11.5–15.5)
WBC: 6.2 10*3/uL (ref 4.0–10.5)
nRBC: 0 % (ref 0.0–0.2)

## 2019-03-24 LAB — COMPREHENSIVE METABOLIC PANEL
ALT: 13 U/L (ref 0–44)
AST: 15 U/L (ref 15–41)
Albumin: 3.9 g/dL (ref 3.5–5.0)
Alkaline Phosphatase: 34 U/L — ABNORMAL LOW (ref 38–126)
Anion gap: 8 (ref 5–15)
BUN: 20 mg/dL (ref 8–23)
CO2: 22 mmol/L (ref 22–32)
Calcium: 8.4 mg/dL — ABNORMAL LOW (ref 8.9–10.3)
Chloride: 112 mmol/L — ABNORMAL HIGH (ref 98–111)
Creatinine, Ser: 0.89 mg/dL (ref 0.61–1.24)
GFR calc Af Amer: >60 mL/min (ref >60)
GFR calc non Af Amer: >60 mL/min (ref >60)
Glucose, Bld: 100 mg/dL — ABNORMAL HIGH (ref 70–99)
Potassium: 3.9 mmol/L (ref 3.5–5.1)
Sodium: 142 mmol/L (ref 135–145)
Total Bilirubin: 0.9 mg/dL (ref 0.3–1.2)
Total Protein: 6.7 g/dL (ref 6.5–8.1)

## 2019-03-24 LAB — PROTIME-INR
INR: 1.2 (ref 0.8–1.2)
Prothrombin Time: 14.9 seconds (ref 11.4–15.2)

## 2019-03-24 LAB — URINALYSIS, ROUTINE W REFLEX MICROSCOPIC
Bilirubin Urine: NEGATIVE
Glucose, UA: NEGATIVE mg/dL
Ketones, ur: NEGATIVE mg/dL
Nitrite: NEGATIVE
Protein, ur: NEGATIVE mg/dL
Specific Gravity, Urine: 1.013 (ref 1.005–1.030)
pH: 5 (ref 5.0–8.0)

## 2019-03-24 LAB — TROPONIN I: Troponin I: <0.03 ng/mL (ref <0.03)

## 2019-03-24 LAB — SARS CORONAVIRUS 2 BY RT PCR (HOSPITAL ORDER, PERFORMED IN ~~LOC~~ HOSPITAL LAB): SARS Coronavirus 2: NEGATIVE

## 2019-03-24 LAB — LIPASE, BLOOD: Lipase: 24 U/L (ref 11–51)

## 2019-03-24 MED ORDER — ROSUVASTATIN CALCIUM 20 MG PO TABS
20.0000 mg | ORAL_TABLET | Freq: Every day | ORAL | Status: DC
  Administered 2019-03-24: 16:00:00 20 mg via ORAL
  Filled 2019-03-24: qty 1

## 2019-03-24 MED ORDER — SODIUM CHLORIDE 0.9% FLUSH: 3.0000 mL | INTRAVENOUS | Status: DC | PRN

## 2019-03-24 MED ORDER — TAMSULOSIN HCL 0.4 MG PO CAPS
0.4000 mg | ORAL_CAPSULE | Freq: Two times a day (BID) | ORAL | Status: DC
  Administered 2019-03-24 – 2019-03-25 (×3): 0.4 mg via ORAL
  Filled 2019-03-24 (×3): qty 1

## 2019-03-24 MED ORDER — METOPROLOL TARTRATE 50 MG PO TABS
50.0000 mg | ORAL_TABLET | Freq: Two times a day (BID) | ORAL | Status: DC
  Administered 2019-03-24 – 2019-03-25 (×3): 50 mg via ORAL
  Filled 2019-03-24 (×3): qty 1

## 2019-03-24 MED ORDER — ENZALUTAMIDE 40 MG PO CAPS: 160.0000 mg | ORAL_CAPSULE | Freq: Every day | ORAL | Status: DC

## 2019-03-24 MED ORDER — SODIUM CHLORIDE 0.9 % IV SOLN
250.0000 mL | INTRAVENOUS | Status: DC | PRN
  Administered 2019-03-25: 10 mL/h via INTRAVENOUS

## 2019-03-24 MED ORDER — FENTANYL CITRATE (PF) 100 MCG/2ML IJ SOLN
INTRAMUSCULAR | Status: AC
  Filled 2019-03-24: qty 2

## 2019-03-24 MED ORDER — SODIUM CHLORIDE 0.9% FLUSH
3.0000 mL | Freq: Two times a day (BID) | INTRAVENOUS | Status: DC
  Administered 2019-03-24 (×2): 3 mL via INTRAVENOUS

## 2019-03-24 MED ORDER — BISACODYL 5 MG PO TBEC: 5.0000 mg | DELAYED_RELEASE_TABLET | Freq: Every day | ORAL | Status: DC | PRN

## 2019-03-24 MED ORDER — PANTOPRAZOLE SODIUM 40 MG PO TBEC
40.0000 mg | DELAYED_RELEASE_TABLET | Freq: Every day | ORAL | Status: DC
  Administered 2019-03-24 – 2019-03-25 (×2): 40 mg via ORAL
  Filled 2019-03-24 (×2): qty 1

## 2019-03-24 MED ORDER — CEFAZOLIN SODIUM-DEXTROSE 2-4 GM/100ML-% IV SOLN
INTRAVENOUS | Status: AC
  Administered 2019-03-24: 11:00:00 2000 mg
  Filled 2019-03-24: qty 100

## 2019-03-24 MED ORDER — NALOXONE HCL 0.4 MG/ML IJ SOLN
INTRAMUSCULAR | Status: AC
  Filled 2019-03-24: qty 1

## 2019-03-24 MED ORDER — HYDRALAZINE HCL 25 MG PO TABS
25.0000 mg | ORAL_TABLET | Freq: Three times a day (TID) | ORAL | Status: DC
  Administered 2019-03-24 – 2019-03-25 (×4): 25 mg via ORAL
  Filled 2019-03-24 (×4): qty 1

## 2019-03-24 MED ORDER — SODIUM CHLORIDE 0.9 % IV SOLN
1.0000 g | INTRAVENOUS | Status: DC
  Administered 2019-03-25: 1 g via INTRAVENOUS
  Filled 2019-03-24: qty 1

## 2019-03-24 MED ORDER — MIDAZOLAM HCL 2 MG/2ML IJ SOLN
INTRAMUSCULAR | Status: AC | PRN
  Administered 2019-03-24 (×2): 1 mg via INTRAVENOUS

## 2019-03-24 MED ORDER — FLUMAZENIL 0.5 MG/5ML IV SOLN
INTRAVENOUS | Status: AC
  Filled 2019-03-24: qty 5

## 2019-03-24 MED ORDER — CALCIUM CARBONATE 1250 (500 CA) MG PO TABS
1.0000 | ORAL_TABLET | Freq: Every day | ORAL | Status: DC
  Administered 2019-03-25: 500 mg via ORAL
  Filled 2019-03-24: qty 1

## 2019-03-24 MED ORDER — ISOSORBIDE MONONITRATE ER 60 MG PO TB24
60.0000 mg | ORAL_TABLET | Freq: Every day | ORAL | Status: DC
  Administered 2019-03-24 – 2019-03-25 (×2): 60 mg via ORAL
  Filled 2019-03-24 (×2): qty 1

## 2019-03-24 MED ORDER — AMLODIPINE BESYLATE 5 MG PO TABS
5.0000 mg | ORAL_TABLET | Freq: Every day | ORAL | Status: DC
  Administered 2019-03-24 – 2019-03-25 (×2): 5 mg via ORAL
  Filled 2019-03-24 (×2): qty 1

## 2019-03-24 MED ORDER — LIDOCAINE HCL (PF) 1 % IJ SOLN
INTRAMUSCULAR | Status: AC | PRN
  Administered 2019-03-24: 5 mL

## 2019-03-24 MED ORDER — LINACLOTIDE 145 MCG PO CAPS
290.0000 ug | ORAL_CAPSULE | Freq: Every day | ORAL | Status: DC
  Administered 2019-03-25: 08:00:00 290 ug via ORAL
  Filled 2019-03-24 (×2): qty 2

## 2019-03-24 MED ORDER — FENTANYL CITRATE (PF) 100 MCG/2ML IJ SOLN
50.0000 ug | Freq: Once | INTRAMUSCULAR | Status: AC
  Administered 2019-03-24: 02:00:00 50 ug via INTRAVENOUS
  Filled 2019-03-24: qty 2

## 2019-03-24 MED ORDER — FENTANYL CITRATE (PF) 100 MCG/2ML IJ SOLN
INTRAMUSCULAR | Status: AC | PRN
  Administered 2019-03-24 (×2): 50 ug via INTRAVENOUS

## 2019-03-24 MED ORDER — HYDRALAZINE HCL 20 MG/ML IJ SOLN
5.0000 mg | Freq: Once | INTRAMUSCULAR | Status: AC
  Administered 2019-03-24: 02:00:00 5 mg via INTRAVENOUS
  Filled 2019-03-24: qty 1

## 2019-03-24 MED ORDER — ACETAMINOPHEN 325 MG PO TABS
650.0000 mg | ORAL_TABLET | Freq: Four times a day (QID) | ORAL | Status: DC | PRN
  Administered 2019-03-24 – 2019-03-25 (×3): 650 mg via ORAL
  Filled 2019-03-24 (×3): qty 2

## 2019-03-24 MED ORDER — ACETAMINOPHEN 650 MG RE SUPP: 650.0000 mg | Freq: Four times a day (QID) | RECTAL | Status: DC | PRN

## 2019-03-24 MED ORDER — HYDRALAZINE HCL 20 MG/ML IJ SOLN
10.0000 mg | Freq: Four times a day (QID) | INTRAMUSCULAR | Status: DC | PRN
  Administered 2019-03-24 – 2019-03-25 (×2): 10 mg via INTRAVENOUS
  Filled 2019-03-24 (×2): qty 1

## 2019-03-24 MED ORDER — MIDAZOLAM HCL 2 MG/2ML IJ SOLN
INTRAMUSCULAR | Status: AC
  Filled 2019-03-24: qty 4

## 2019-03-24 MED ORDER — IOHEXOL 300 MG/ML  SOLN
100.0000 mL | Freq: Once | INTRAMUSCULAR | Status: AC | PRN
  Administered 2019-03-24: 01:00:00 100 mL via INTRAVENOUS

## 2019-03-24 NOTE — Consult Note (Addendum)
Consultation: Urinary retention, bilateral hydronephrosis, hypertension Requested by: Dr. Ezequiel Essex  History of Present Illness: Elijah Santana is a 85 year old male with history of metastatic prostate cancer status post radiation and now on Lupron and Xtandi.  He also has a severe prostatic urethral stricture which Dr. Jeffie Pollock was not able to negotiate in the office the other day.  Patient has chronic retention with post voids around 500 cc and a distended bladder on prior PET/CT and CT scan.  I reviewed those images.  However since the procedure he has had a weaker stream and only dribbling small amounts of urine.  He has suprapubic pain and urgency to void.  He has had no fever and he feels well otherwise apart from some malaise.  He underwent CT scan of the abdomen and pelvis last night which revealed bilateral hydroureteronephrosis down to a distended bladder.  I reviewed the images.  Given the patient's blood thinner use I felt like it would be safer for interventional radiology to place a drain under image guidance and for the patient to be observed and treated for his hypertension.  He may have some hematuria and require access to the bladder this weekend via stricture dilation which Dr. Jeffie Pollock was planning for next week.  Modifying factors: There are no other modifying factors  Associated signs and symptoms: There are no other associated signs and symptoms Aggravating and relieving factors: There are no other aggravating or relieving factors Severity: Severe Duration: Persistent   Past Medical History:  Diagnosis Date  . Arthritis   . Asthma    Childhood  . Chronic back pain   . Coronary atherosclerosis of native coronary artery    a. CABG 2004 with LIMA-LAD, SVG-D1, SVG-OM, SVG-PDA. b. Cath ~2010 with occ of SVG-diagonal, distal LAD and OM filiing by collaterals. c. NSTEMI 12/2014 s/p DES to SVG-RCA. d. 11/2015: DES to distal graft of the SVG-distal RCA  . Essential hypertension   . Foley  catheter in place   . GERD (gastroesophageal reflux disease)   . Hard of hearing   . Hyperglycemia   . Ischemic cardiomyopathy    a. EF 45% by cath 12/2014.  . Mixed hyperlipidemia   . NSTEMI (non-ST elevated myocardial infarction) ([place]) 01/01/15  . Paroxysmal atrial fibrillation (HCC)   . Pneumonia    hx of   . Prostate cancer [facility])    Radiation therapy 1996  . Skin cancer   . Stroke [facility])    TIA- 28 years ago    Past Surgical History:  Procedure Laterality Date  . CARDIAC CATHETERIZATION  01/01/2015   Procedure: CORONARY STENT INTERVENTION;  Surgeon: Peter M Martinique, MD;  Location: [facility] CATH LAB;  Service: Cardiovascular;;  SVG to PDA  . CARDIAC CATHETERIZATION N/A 11/21/2015   Procedure: Left Heart Cath and Cors/Grafts Angiography;  Surgeon: Troy Sine, MD;  Location: [place] CV LAB;  Service: Cardiovascular;  Laterality: N/A;  . CARDIAC CATHETERIZATION N/A 11/21/2015   Procedure: Coronary Stent Intervention;  Surgeon: Troy Sine, MD;  Location: [place] CV LAB;  Service: Cardiovascular;  Laterality: N/A;  . CARDIAC CATHETERIZATION N/A 09/11/2016   Procedure: Left Heart Cath and Cors/Grafts Angiography;  Surgeon: Leonie Man, MD;  Location: [place] CV LAB;  Service: Cardiovascular;  Laterality: N/A;  . CARDIAC CATHETERIZATION N/A 09/11/2016   Procedure: Coronary Stent Intervention;  Surgeon: Leonie Man, MD;  Location: [place] CV LAB;  Service: Cardiovascular;  Laterality: N/A;  . CORONARY ARTERY BYPASS GRAFT  2004   LIMA to LAD, SVG to diagonal, SVG to circumflex, SVG to PDA  . GREEN LIGHT LASER TURP (TRANSURETHRAL RESECTION OF PROSTATE N/A 06/04/2016   Procedure: GREEN LIGHT LASER TURP (TRANSURETHRAL RESECTION OF PROSTATE;  Surgeon: Irine Seal, MD;  Location: [facility] ORS;  Service: Urology;  Laterality: N/A;  . LEFT HEART CATHETERIZATION WITH CORONARY/GRAFT ANGIOGRAM N/A 01/01/2015   Procedure: LEFT HEART CATHETERIZATION WITH Beatrix Fetters;  Surgeon:  Peter M Martinique, MD;  Location: [facility] CATH LAB;  Service: Cardiovascular;  Laterality: N/A;  . PERCUTANEOUS CORONARY STENT INTERVENTION (PCI-S)  11/20/2015   distal SVG  with DES       . TONSILLECTOMY      Home Medications:  Medications Prior to Admission  Medication Sig Dispense Refill Last Dose  . acetaminophen (TYLENOL) 325 MG tablet Take 2 tablets (650 mg total) by mouth every 4 (four) hours as needed for headache or mild pain.   Past Month at Unknown time  . amLODipine (NORVASC) 5 MG tablet TAKE ONE TABLET BY MOUTH DAILY (MORNING) 30 tablet 3 03/23/2019 at Unknown time  . apixaban (ELIQUIS) 5 MG TABS tablet TAKE ONE TABLET BY MOUTH TWICE DAILY (MORNING ,EVENING) 180 tablet 2 Past Week at Unknown time  . bisacodyl (DULCOLAX) 5 MG EC tablet Take 5-10 mg by mouth daily as needed (constipation). Reported on 05/28/2016   03/23/2019 at Unknown time  . calcium carbonate (OS-CAL - DOSED IN MG OF ELEMENTAL CALCIUM) 1250 (500 Ca) MG tablet Take 1 tablet by mouth.   03/23/2019 at Unknown time  . clopidogrel (PLAVIX) 75 MG tablet TAKE ONE TABLET BY MOUTH EVERY DAY (,EVENING) 30 tablet 6 Past Week at Unknown time  . isosorbide mononitrate (IMDUR) 60 MG 24 hr tablet TAKE ONE TABLET BY MOUTH DAILY (MORNING) 30 tablet 6 03/23/2019 at Unknown time  . linaclotide (LINZESS) 290 MCG CAPS capsule Take 1 capsule (290 mcg total) by mouth daily before breakfast. 30 capsule 3 03/23/2019 at Unknown time  . metoprolol tartrate (LOPRESSOR) 50 MG tablet TAKE ONE TABLET BY MOUTH TWICE DAILY (MORNING ,EVENING) 180 tablet 3 03/23/2019 at Unknown time  . nitroGLYCERIN (NITROSTAT) 0.4 MG SL tablet PLACE ONE (1) TABLET UNDER TONGUE EVERY 5 MINUTES UP TO (3) DOSES AS NEEDED FOR CHEST PAIN. 25 tablet 3 unk  . pantoprazole (PROTONIX) 40 MG tablet Take 1 tablet (40 mg total) by mouth daily. 90 tablet 3 03/23/2019 at Unknown time  . rosuvastatin (CRESTOR) 20 MG tablet TAKE ONE TABLET BY MOUTH DAILY (EVENING) 30 tablet 6 03/23/2019 at Unknown time  .  STOOL SOFTENER 100 MG capsule TAKE 1 OR 2 CAPSULES BY MOUTH DAILY AS NEEDED.   03/23/2019 at Unknown time  . tamsulosin (FLOMAX) 0.4 MG CAPS capsule Take 0.4 mg by mouth as directed. Morning and evening  11 03/23/2019 at Unknown time  . XTANDI 40 MG capsule TAKE 4 CAPSULES (160 MG TOTAL) BY MOUTH DAILY. 120 capsule 3 03/23/2019 at Unknown time  . doxazosin (CARDURA) 4 MG tablet TAKE ONE TABLET BY MOUTH DAILY. (,EVENING) 90 tablet 2 Unknown at Unknown time  . ketoconazole (NIZORAL) 2 % cream APPLY TWICE DAILY TO THE BOTTOMS OF BOTH FEET.   Unknown at Unknown time  . magnesium citrate SOLN TAKE ONE BOTTLE BY MOUTH ONCE FOR ONE DOSE.   Unknown at Unknown time  . PRESCRIPTION MEDICATION Hormone shots done at Dr. Ralene Muskrat office once every 4 months (for prostrate) - last injection mid December 2016   Unknown at Unknown time  .  traMADol (ULTRAM) 50 MG tablet Take 0.5 tablets (25 mg total) by mouth daily as needed. 30 tablet 0 Unknown at Unknown time   Allergies: No Known Allergies  Family History  Problem Relation Age of Onset  . Hypertension Father   . Coronary artery disease Father    Social History:  reports that he quit smoking about 32 years ago. His smoking use included cigars. He started smoking about 54 years ago. He has a 13.50 pack-year smoking history. He quit smokeless tobacco use about 8 years ago.  His smokeless tobacco use included chew. He reports that he does not drink alcohol or use drugs.  ROS: A complete review of systems was performed.  All systems are negative except for pertinent findings as noted. Review of Systems  Constitutional: Positive for malaise/fatigue.     Physical Exam:  Vital signs in last 24 hours: Temp:  [98.2 F (36.8 C)-98.6 F (37 C)] 98.6 F (37 C) (05/08 0445) Pulse Rate:  [66-87] 87 (05/08 0445) Resp:  [12-24] 20 (05/08 0445) BP: (153-209)/(62-106) 153/85 (05/08 0445) SpO2:  [93 %-98 %] 96 % (05/08 0445) Weight:  [95.3 kg-98.6 kg] 98.6 kg (05/08  0437) General:  Alert and oriented, No acute distress, he has an urge to void and was sitting on the toilet with some drips of overflow incontinence.  He walked back to the bed for exam. HEENT: Normocephalic, atraumatic Cardiovascular: Regular rate and rhythm Lungs: Regular rate and effort Abdomen: Soft, nontender, no abdominal masses, suprapubic mass consistent with his distended bladder Back: No CVA tenderness Extremities: No edema Neurologic: Grossly intact  Laboratory Data:  Results for orders placed or performed during the hospital encounter of 03/23/19 (from the past 24 hour(s))  Urinalysis, Routine w reflex microscopic     Status: Abnormal   Collection Time: 03/23/19 11:27 PM  Result Value Ref Range   Color, Urine YELLOW YELLOW   APPearance CLEAR CLEAR   Specific Gravity, Urine 1.013 1.005 - 1.030   pH 5.0 5.0 - 8.0   Glucose, UA NEGATIVE NEGATIVE mg/dL   Hgb urine dipstick MODERATE (A) NEGATIVE   Bilirubin Urine NEGATIVE NEGATIVE   Ketones, ur NEGATIVE NEGATIVE mg/dL   Protein, ur NEGATIVE NEGATIVE mg/dL   Nitrite NEGATIVE NEGATIVE   Leukocytes,Ua TRACE (A) NEGATIVE   RBC / HPF 11-20 0 - 5 RBC/hpf   WBC, UA 21-50 0 - 5 WBC/hpf   Bacteria, UA RARE (A) NONE SEEN   Mucus PRESENT   CBC with Differential/Platelet     Status: Abnormal   Collection Time: 03/23/19 11:55 PM  Result Value Ref Range   WBC 6.2 4.0 - 10.5 K/uL   RBC 3.89 (L) 4.22 - 5.81 MIL/uL   Hemoglobin 11.6 (L) 13.0 - 17.0 g/dL   HCT 36.7 (L) 39.0 - 52.0 %   MCV 94.3 80.0 - 100.0 fL   MCH 29.8 26.0 - 34.0 pg   MCHC 31.6 30.0 - 36.0 g/dL   RDW 14.0 11.5 - 15.5 %   Platelets 186 150 - 400 K/uL   nRBC 0.0 0.0 - 0.2 %   Neutrophils Relative % 65 %   Neutro Abs 4.1 1.7 - 7.7 K/uL   Lymphocytes Relative 21 %   Lymphs Abs 1.3 0.7 - 4.0 K/uL   Monocytes Relative 8 %   Monocytes Absolute 0.5 0.1 - 1.0 K/uL   Eosinophils Relative 5 %   Eosinophils Absolute 0.3 0.0 - 0.5 K/uL   Basophils Relative 1 %    Basophils  Absolute 0.0 0.0 - 0.1 K/uL   Immature Granulocytes 0 %   Abs Immature Granulocytes 0.01 0.00 - 0.07 K/uL  Comprehensive metabolic panel     Status: Abnormal   Collection Time: 03/23/19 11:55 PM  Result Value Ref Range   Sodium 142 135 - 145 mmol/L   Potassium 3.9 3.5 - 5.1 mmol/L   Chloride 112 (H) 98 - 111 mmol/L   CO2 22 22 - 32 mmol/L   Glucose, Bld 100 (H) 70 - 99 mg/dL   BUN 20 8 - 23 mg/dL   Creatinine, Ser 0.89 0.61 - 1.24 mg/dL   Calcium 8.4 (L) 8.9 - 10.3 mg/dL   Total Protein 6.7 6.5 - 8.1 g/dL   Albumin 3.9 3.5 - 5.0 g/dL   AST 15 15 - 41 U/L   ALT 13 0 - 44 U/L   Alkaline Phosphatase 34 (L) 38 - 126 U/L   Total Bilirubin 0.9 0.3 - 1.2 mg/dL   GFR calc non Af Amer >60 >60 mL/min   GFR calc Af Amer >60 >60 mL/min   Anion gap 8 5 - 15  Lipase, blood     Status: None   Collection Time: 03/23/19 11:55 PM  Result Value Ref Range   Lipase 24 11 - 51 U/L  Troponin I - ONCE - STAT     Status: None   Collection Time: 03/23/19 11:55 PM  Result Value Ref Range   Troponin I <0.03 <0.03 ng/mL  SARS Coronavirus 2 (CEPHEID - Performed in [place] hospital lab), Hosp Order     Status: None   Collection Time: 03/24/19  1:57 AM  Result Value Ref Range   SARS Coronavirus 2 NEGATIVE NEGATIVE  Protime-INR     Status: None   Collection Time: 03/24/19  8:53 AM  Result Value Ref Range   Prothrombin Time 14.9 11.4 - 15.2 seconds   INR 1.2 0.8 - 1.2   Recent Results (from the past 240 hour(s))  SARS Coronavirus 2 (CEPHEID - Performed in [place] hospital lab), Hosp Order     Status: None   Collection Time: 03/24/19  1:57 AM  Result Value Ref Range Status   SARS Coronavirus 2 NEGATIVE NEGATIVE Final    Comment: (NOTE) If result is NEGATIVE SARS-CoV-2 target nucleic acids are NOT DETECTED. The SARS-CoV-2 RNA is generally detectable in upper and lower  respiratory specimens during the acute phase of infection. The lowest  concentration of SARS-CoV-2 viral copies this  assay can detect is 250  copies / mL. A negative result does not preclude SARS-CoV-2 infection  and should not be used as the sole basis for treatment or other  patient management decisions.  A negative result may occur with  improper specimen collection / handling, submission of specimen other  than nasopharyngeal swab, presence of viral mutation(s) within the  areas targeted by this assay, and inadequate number of viral copies  (<250 copies / mL). A negative result must be combined with clinical  observations, patient history, and epidemiological information. If result is POSITIVE SARS-CoV-2 target nucleic acids are DETECTED. The SARS-CoV-2 RNA is generally detectable in upper and lower  respiratory specimens dur ing the acute phase of infection.  Positive  results are indicative of active infection with SARS-CoV-2.  Clinical  correlation with patient history and other diagnostic information is  necessary to determine patient infection status.  Positive results do  not rule out bacterial infection or co-infection with other viruses. If result is PRESUMPTIVE POSTIVE  SARS-CoV-2 nucleic acids MAY BE PRESENT.   A presumptive positive result was obtained on the submitted specimen  and confirmed on repeat testing.  While 2019 novel coronavirus  (SARS-CoV-2) nucleic acids may be present in the submitted sample  additional confirmatory testing may be necessary for epidemiological  and / or clinical management purposes  to differentiate between  SARS-CoV-2 and other Sarbecovirus currently known to infect humans.  If clinically indicated additional testing with an alternate test  methodology [phone]) is advised. The SARS-CoV-2 RNA is generally  detectable in upper and lower respiratory sp ecimens during the acute  phase of infection. The expected result is Negative. Fact Sheet for Patients:  StrictlyIdeas.no Fact Sheet for Healthcare  Providers: BankingDealers.co.za This test is not yet approved or cleared by the [place] FDA and has been authorized for detection and/or diagnosis of SARS-CoV-2 by FDA under an Emergency Use Authorization (EUA).  This EUA will remain in effect (meaning this test can be used) for the duration of the COVID-19 declaration under Section 564(b)(1) of the Act, 21 U.S.C. section 360bbb-3(b)(1), unless the authorization is terminated or revoked sooner. Performed at [facility], [address] [place], [place] [address]    Creatinine: Recent Labs    03/23/19 2355  CREATININE 0.89    Impression/Assessment/plan:  #1 urinary retention- he has a severe prostatic urethral stricture Dr. Jeffie Pollock was not able to negotiate in the office. Dr. Jeffie Pollock is planning stricture dilation and possible SP tube placement next week, so will proceed with SP tube placement today given severity of the retention.  I went over the nature risk benefits and alternatives to SP tube placement with the patient. We discussed he may have bleeding after the procedure due to resolution of bladder distention and the blood thinner use among other risks.   #2 bilateral hydronephrosis- hydronephrosis not present on his prior imaging, so likely related to the retention.  It should resolve.  His creatinine was normal.  #3 prostatic urethral stricture- I discussed with patient Dr. Jeffie Pollock will likely proceed with dilation but we discussed in stricture recurrence is not uncommon and some patients prefer the suprapubic tube over the long run.  Also risk of stricture dilation includes incontinence among others.  #4 metastatic prostate cancer-on Lupron and Xtandi.  Elijah Santana 03/24/2019, 11:08 AM

## 2019-03-24 NOTE — Procedures (Signed)
Interventional Radiology Procedure Note  Procedure: 14F suprapubic catheter placed.  1.6L urine evacuated.   Complications: None  Estimated Blood Loss: None  Recommendations: - To floor - Drain to foley  Signed,  Criselda Peaches, MD

## 2019-03-24 NOTE — Progress Notes (Signed)
[facility] PROGRESS NOTE  Elijah Santana JGM:[id number] DOB: April 18, 1932 DOA: 03/23/2019 PCP: Glenda Chroman, MD   Narrative: 85 year old Caucasian male CAD ischemic EF 45%-cardiac cath 09/11/2016 PCI stable medical therapy Paroxysmal A. 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 with bone metastases status post denosumab starting 05/02/2018 History of prior CVA  Admitted from [facility] because of inability to urinate over the past 4 weeks was supposed to have suprapubic catheter placed by urology 03/28/2019 Found to have uncontrolled hypertension probably secondary to the same Eliquis was held Dr. Junious Silk was aware IR saw the patient  BP 124/72 (BP Location: Right Arm)   Pulse 65   Temp 98.4 F (36.9 C) (Oral)   Resp 19   Ht _0  (1.727 m)   Wt 98.6 kg   SpO2 93%   BMI 33.06 kg/m   O/e eomi ncat no distress younger than stated age No pallor Mod dentition No jvd s1 s 2 nsr to ny exam Neuro intact no focal deficit  Awake alert jovial no dostress eomi ncat No abd pain    A & Plan Acute urinary retention MEt PX Ca CAD/Afib  -appreciate IR expediency re: Suprapubic cat -resume eliquis/plavix per IR -ambulate in hallways today [lives alone--RN to inform if > standby assist] -Needs OP followup Dr. Bing Quarry cc'ing Dr's Junious Silk and Jeffie Pollock -? Home am if all stable   Verneita Griffes, MD [facility] 3:31 PM

## 2019-03-24 NOTE — ED Notes (Signed)
Patient transported to CT 

## 2019-03-24 NOTE — Progress Notes (Signed)
Patient ambulated hall way with supervision - 250 feet.  Tolerated well - no dizziness, remained steady.  States he is near baseline.

## 2019-03-24 NOTE — Progress Notes (Signed)
Spoke to Radiologist Jarvis Newcomer, MD in regards to pt's STAT IR consult. MD made aware of pt's condition. Due to pt being hemodynamically stable, radiology will consult in AM. Primary RN, Ubaldo Glassing made aware of situation.

## 2019-03-24 NOTE — Progress Notes (Signed)
MEDICATION RELATED CONSULT NOTE - INITIAL   Pharmacy Consult for Enzalutamide  Indication: Prostate Cancer  No Known Allergies  Patient Measurements: Height: 5\' 8"  (172.7 cm) Weight: 217 lb 6.4 oz (98.6 kg) IBW/kg (Calculated) : 68.4 Adjusted Body Weight:   Vital Signs: Temp: 98.6 F (37 C) (05/08 0445) Temp Source: Oral (05/08 0445) BP: 153/85 (05/08 0445) Pulse Rate: 87 (05/08 0445) Intake/Output from previous day: 05/07 0701 - 05/08 0700 In: -  Out: 280 [Urine:280] Intake/Output from this shift: Total I/O In: -  Out: 280 [Urine:280]  Labs: Recent Labs    03/23/19 2355  WBC 6.2  HGB 11.6*  HCT 36.7*  PLT 186  CREATININE 0.89  ALBUMIN 3.9  PROT 6.7  AST 15  ALT 13  ALKPHOS 34*  BILITOT 0.9   Estimated Creatinine Clearance: 70.3 mL/min (by C-G formula based on SCr of 0.89 mg/dL).   Microbiology: Recent Results (from the past 720 hour(s))  SARS Coronavirus 2 (CEPHEID - Performed in [place] hospital lab), Hosp Order     Status: None   Collection Time: 03/24/19  1:57 AM  Result Value Ref Range Status   SARS Coronavirus 2 NEGATIVE NEGATIVE Final    Comment: (NOTE) If result is NEGATIVE SARS-CoV-2 target nucleic acids are NOT DETECTED. The SARS-CoV-2 RNA is generally detectable in upper and lower  respiratory specimens during the acute phase of infection. The lowest  concentration of SARS-CoV-2 viral copies this assay can detect is 250  copies / mL. A negative result does not preclude SARS-CoV-2 infection  and should not be used as the sole basis for treatment or other  patient management decisions.  A negative result may occur with  improper specimen collection / handling, submission of specimen other  than nasopharyngeal swab, presence of viral mutation(s) within the  areas targeted by this assay, and inadequate number of viral copies  (<250 copies / mL). A negative result must be combined with clinical  observations, patient history, and  epidemiological information. If result is POSITIVE SARS-CoV-2 target nucleic acids are DETECTED. The SARS-CoV-2 RNA is generally detectable in upper and lower  respiratory specimens dur ing the acute phase of infection.  Positive  results are indicative of active infection with SARS-CoV-2.  Clinical  correlation with patient history and other diagnostic information is  necessary to determine patient infection status.  Positive results do  not rule out bacterial infection or co-infection with other viruses. If result is PRESUMPTIVE POSTIVE SARS-CoV-2 nucleic acids MAY BE PRESENT.   A presumptive positive result was obtained on the submitted specimen  and confirmed on repeat testing.  While 2019 novel coronavirus  (SARS-CoV-2) nucleic acids may be present in the submitted sample  additional confirmatory testing may be necessary for epidemiological  and / or clinical management purposes  to differentiate between  SARS-CoV-2 and other Sarbecovirus currently known to infect humans.  If clinically indicated additional testing with an alternate test  methodology [phone]) is advised. The SARS-CoV-2 RNA is generally  detectable in upper and lower respiratory sp ecimens during the acute  phase of infection. The expected result is Negative. Fact Sheet for Patients:  StrictlyIdeas.no Fact Sheet for Healthcare Providers: BankingDealers.co.za This test is not yet approved or cleared by the [place] FDA and has been authorized for detection and/or diagnosis of SARS-CoV-2 by FDA under an Emergency Use Authorization (EUA).  This EUA will remain in effect (meaning this test can be used) for the duration of the COVID-19 declaration under Section 564(b)(1)  of the Act, 21 U.S.C. section 360bbb-3(b)(1), unless the authorization is terminated or revoked sooner. Performed at [facility], [address] [place], [place] [address]     Medical  History: Past Medical History:  Diagnosis Date  . Arthritis   . Asthma    Childhood  . Chronic back pain   . Coronary atherosclerosis of native coronary artery    a. CABG 2004 with LIMA-LAD, SVG-D1, SVG-OM, SVG-PDA. b. Cath ~2010 with occ of SVG-diagonal, distal LAD and OM filiing by collaterals. c. NSTEMI 12/2014 s/p DES to SVG-RCA. d. 11/2015: DES to distal graft of the SVG-distal RCA  . Essential hypertension   . Foley catheter in place   . GERD (gastroesophageal reflux disease)   . Hard of hearing   . Hyperglycemia   . Ischemic cardiomyopathy    a. EF 45% by cath 12/2014.  . Mixed hyperlipidemia   . NSTEMI (non-ST elevated myocardial infarction) ([place]) 01/01/15  . Paroxysmal atrial fibrillation ([facility])   . Pneumonia    hx of   . Prostate cancer [facility])    Radiation therapy 1996  . Skin cancer   . Stroke [facility])    TIA- 28 years ago     Medications:  Medications Prior to Admission  Medication Sig Dispense Refill Last Dose  . acetaminophen (TYLENOL) 325 MG tablet Take 2 tablets (650 mg total) by mouth every 4 (four) hours as needed for headache or mild pain.   Past Month at Unknown time  . amLODipine (NORVASC) 5 MG tablet TAKE ONE TABLET BY MOUTH DAILY (MORNING) 30 tablet 3 03/23/2019 at Unknown time  . apixaban (ELIQUIS) 5 MG TABS tablet TAKE ONE TABLET BY MOUTH TWICE DAILY (MORNING ,EVENING) 180 tablet 2 Past Week at Unknown time  . bisacodyl (DULCOLAX) 5 MG EC tablet Take 5-10 mg by mouth daily as needed (constipation). Reported on 05/28/2016   03/23/2019 at Unknown time  . calcium carbonate (OS-CAL - DOSED IN MG OF ELEMENTAL CALCIUM) 1250 (500 Ca) MG tablet Take 1 tablet by mouth.   03/23/2019 at Unknown time  . clopidogrel (PLAVIX) 75 MG tablet TAKE ONE TABLET BY MOUTH EVERY DAY (,EVENING) 30 tablet 6 Past Week at Unknown time  . isosorbide mononitrate (IMDUR) 60 MG 24 hr tablet TAKE ONE TABLET BY MOUTH DAILY (MORNING) 30 tablet 6 03/23/2019 at Unknown time  . linaclotide (LINZESS) 290 MCG  CAPS capsule Take 1 capsule (290 mcg total) by mouth daily before breakfast. 30 capsule 3 03/23/2019 at Unknown time  . metoprolol tartrate (LOPRESSOR) 50 MG tablet TAKE ONE TABLET BY MOUTH TWICE DAILY (MORNING ,EVENING) 180 tablet 3 03/23/2019 at Unknown time  . nitroGLYCERIN (NITROSTAT) 0.4 MG SL tablet PLACE ONE (1) TABLET UNDER TONGUE EVERY 5 MINUTES UP TO (3) DOSES AS NEEDED FOR CHEST PAIN. 25 tablet 3 unk  . pantoprazole (PROTONIX) 40 MG tablet Take 1 tablet (40 mg total) by mouth daily. 90 tablet 3 03/23/2019 at Unknown time  . rosuvastatin (CRESTOR) 20 MG tablet TAKE ONE TABLET BY MOUTH DAILY (EVENING) 30 tablet 6 03/23/2019 at Unknown time  . STOOL SOFTENER 100 MG capsule TAKE 1 OR 2 CAPSULES BY MOUTH DAILY AS NEEDED.   03/23/2019 at Unknown time  . tamsulosin (FLOMAX) 0.4 MG CAPS capsule Take 0.4 mg by mouth as directed. Morning and evening  11 03/23/2019 at Unknown time  . XTANDI 40 MG capsule TAKE 4 CAPSULES (160 MG TOTAL) BY MOUTH DAILY. 120 capsule 3 03/23/2019 at Unknown time  . doxazosin (CARDURA)  4 MG tablet TAKE ONE TABLET BY MOUTH DAILY. (,EVENING) 90 tablet 2 Unknown at Unknown time  . ketoconazole (NIZORAL) 2 % cream APPLY TWICE DAILY TO THE BOTTOMS OF BOTH FEET.   Unknown at Unknown time  . magnesium citrate SOLN TAKE ONE BOTTLE BY MOUTH ONCE FOR ONE DOSE.   Unknown at Unknown time  . PRESCRIPTION MEDICATION Hormone shots done at Dr. Ralene Muskrat office once every 4 months (for prostrate) - last injection mid December 2016   Unknown at Unknown time  . traMADol (ULTRAM) 50 MG tablet Take 0.5 tablets (25 mg total) by mouth daily as needed. 30 tablet 0 Unknown at Unknown time    Assessment: Patient with active infection.    Enzalutamide [facility]) Hold Criteria: Seizures Hgb < 8 WBC < 2000 ANC < 1000 Pltc < 50K Active infection (Screen for drug interactions)  Goal of Therapy:  Safe and effective use of Enzalutamide  Plan:  Hold Enzalutamide Follow up with plan  Nani Skillern  [place] 03/24/2019,4:53 AM

## 2019-03-24 NOTE — H&P (Addendum)
TRH H&P    Patient Demographics:    Elijah Santana, is a 85 y.o. male  MRN: [id number]  DOB - 03/14/1932  Admit Date - 03/23/2019  Referring MD/NP/PA: Ezequiel Essex  Outpatient Primary MD for the patient is Glenda Chroman, MD Rozann Lesches - cardiologist Irine Seal - Urology Derek Jack - Oncology [facility])  Patient coming from:  home  Chief complaint- uncontrolled hypertension, inability to urinate   HPI:    Elijah Santana  is a 85 y.o. male, w hypertension, CAD , ischemic CM (EF 45%), h/o CVA, metastatic prostate cancer, presents with c/o decrease in ability to urinate over the past 4 weeks.  Pt was scheduled to have a suprapubic catheter placement on Tuesday.  Pt was concerned today due to sbp >200, and lightheadedness. + abdominal distension and decrease in urine output.   Pt denies fever, chills, cp, palp, sob, n/v, abd pain diarrhea, brbpr, dysuria, gross hematuria, focal neurological weakness, numbness, tingling.    In ED,  T 98.2, P 66-85  R 24 Bp 271/85  Pox 93-98% Wt 95.3 kg  Wbc 6.2, Hgb 11.6, Plt 186 Na 142, K 3.9, Bun 20, Creatinine 0.89 Ast 15, Alt 13, Alk phos 34 T. Bili 0.9 Lipase 24 Trop <0.03   CT brain IMPRESSION: 1. No acute intracranial abnormality identified. 2. Chronic microvascular ischemic changes and volume loss of the brain.  CT abdomen pelvis IMPRESSION: Severe bilateral hydronephrosis. Urinary bladder is distended and severely trabeculated.  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  Urinalysis Wbc 21-50, Rbc 11-20  Urology [facility]) consulted and recommended transfer to [facility] for suprapubic catheter placement  Pt will be admitted for hypertensive urgency, bilateral hydronephrosis (severe) secondary to BPH/ prostate cancer, acute UTI.    Review of systems:    In  addition to the HPI above,  No Fever-chills, No Headache, No changes with Vision or hearing, No problems swallowing food or Liquids, No Chest pain, Cough or Shortness of Breath, No Abdominal pain, No Nausea or Vomiting, bowel movements are regular, No Blood in stool or Urine, No dysuria, No new skin rashes or bruises, No new joints pains-aches,  No new weakness, tingling, numbness in any extremity, No recent weight gain or loss, No polyuria, polydypsia or polyphagia, No significant Mental Stressors.  All other systems reviewed and are negative.    Past History of the following :    Past Medical History:  Diagnosis Date   Arthritis    Asthma    Childhood   Chronic back pain    Coronary atherosclerosis of native coronary artery    a. CABG 2004 with LIMA-LAD, SVG-D1, SVG-OM, SVG-PDA. b. Cath ~2010 with occ of SVG-diagonal, distal LAD and OM filiing by collaterals. c. NSTEMI 12/2014 s/p DES to SVG-RCA. d. 11/2015: DES to distal graft of the SVG-distal RCA   Essential hypertension    Foley catheter in place    GERD (gastroesophageal reflux disease)    Hard of hearing  Hyperglycemia    Ischemic cardiomyopathy    a. EF 45% by cath 12/2014.   Mixed hyperlipidemia    NSTEMI (non-ST elevated myocardial infarction) ([place]) 01/01/15   Pneumonia    hx of    Prostate cancer [facility])    Radiation therapy 1996   Skin cancer    Stroke [facility])    TIA- 28 years ago       Past Surgical History:  Procedure Laterality Date   CARDIAC CATHETERIZATION  01/01/2015   Procedure: CORONARY STENT INTERVENTION;  Surgeon: Peter M Martinique, MD;  Location: [facility] CATH LAB;  Service: Cardiovascular;;  SVG to PDA   CARDIAC CATHETERIZATION N/A 11/21/2015   Procedure: Left Heart Cath and Cors/Grafts Angiography;  Surgeon: Troy Sine, MD;  Location: [place] CV LAB;  Service: Cardiovascular;  Laterality: N/A;   CARDIAC CATHETERIZATION N/A 11/21/2015   Procedure: Coronary Stent Intervention;   Surgeon: Troy Sine, MD;  Location: [place] CV LAB;  Service: Cardiovascular;  Laterality: N/A;   CARDIAC CATHETERIZATION N/A 09/11/2016   Procedure: Left Heart Cath and Cors/Grafts Angiography;  Surgeon: Leonie Man, MD;  Location: [place] CV LAB;  Service: Cardiovascular;  Laterality: N/A;   CARDIAC CATHETERIZATION N/A 09/11/2016   Procedure: Coronary Stent Intervention;  Surgeon: Leonie Man, MD;  Location: [place] CV LAB;  Service: Cardiovascular;  Laterality: N/A;   CORONARY ARTERY BYPASS GRAFT  2004   LIMA to LAD, SVG to diagonal, SVG to circumflex, SVG to PDA   GREEN LIGHT LASER TURP (TRANSURETHRAL RESECTION OF PROSTATE N/A 06/04/2016   Procedure: GREEN LIGHT LASER TURP (TRANSURETHRAL RESECTION OF PROSTATE;  Surgeon: Irine Seal, MD;  Location: [facility] ORS;  Service: Urology;  Laterality: N/A;   LEFT HEART CATHETERIZATION WITH CORONARY/GRAFT ANGIOGRAM N/A 01/01/2015   Procedure: LEFT HEART CATHETERIZATION WITH Beatrix Fetters;  Surgeon: Peter M Martinique, MD;  Location: [facility] CATH LAB;  Service: Cardiovascular;  Laterality: N/A;   PERCUTANEOUS CORONARY STENT INTERVENTION (PCI-S)  11/20/2015   distal SVG  with DES        TONSILLECTOMY        Social History:      Social History   Tobacco Use   Smoking status: Former Smoker    Packs/day: 0.50    Years: 27.00    Pack years: 13.50    Types: Cigars    Start date: 07/28/1964    Last attempt to quit: 07/28/1986    Years since quitting: 32.6   Smokeless tobacco: Former Systems developer    Types: Chew    Quit date: 08/07/2010   Tobacco comment: never chewed up over a pack/day  Substance Use Topics   Alcohol use: No    Alcohol/week: 0.0 standard drinks       Family History :     Family History  Problem Relation Age of Onset   Hypertension Father    Coronary artery disease Father        Home Medications:   Prior to Admission medications   Medication Sig Start Date End Date Taking? Authorizing  Provider  amLODipine (NORVASC) 5 MG tablet TAKE ONE TABLET BY MOUTH DAILY (MORNING) 11/22/17  Yes Satira Sark, MD  apixaban (ELIQUIS) 5 MG TABS tablet TAKE ONE TABLET BY MOUTH TWICE DAILY (MORNING ,EVENING) 12/28/18  Yes Satira Sark, MD  bisacodyl (DULCOLAX) 5 MG EC tablet Take 5-10 mg by mouth daily as needed (constipation). Reported on 05/28/2016   Yes [provider]  calcium carbonate (OS-CAL - DOSED IN  MG OF ELEMENTAL CALCIUM) 1250 (500 Ca) MG tablet Take 1 tablet by mouth.   Yes [provider]  clopidogrel (PLAVIX) 75 MG tablet TAKE ONE TABLET BY MOUTH EVERY DAY (,EVENING) 03/06/19  Yes Satira Sark, MD  isosorbide mononitrate (IMDUR) 60 MG 24 hr tablet TAKE ONE TABLET BY MOUTH DAILY (MORNING) 03/06/19  Yes Satira Sark, MD  linaclotide [facility] 290 MCG CAPS capsule Take 1 capsule (290 mcg total) by mouth daily before breakfast. 02/23/19  Yes Setzer, Terri L, NP  metoprolol tartrate (LOPRESSOR) 50 MG tablet TAKE ONE TABLET BY MOUTH TWICE DAILY (MORNING ,EVENING) 11/17/18  Yes Satira Sark, MD  pantoprazole (PROTONIX) 40 MG tablet Take 1 tablet (40 mg total) by mouth daily. 12/01/16  Yes Satira Sark, MD  rosuvastatin (CRESTOR) 20 MG tablet TAKE ONE TABLET BY MOUTH DAILY (EVENING) 03/06/19  Yes Satira Sark, MD  STOOL SOFTENER 100 MG capsule TAKE 1 OR 2 CAPSULES BY MOUTH DAILY AS NEEDED. 01/02/19  Yes [provider]  tamsulosin (FLOMAX) 0.4 MG CAPS capsule TAKE ONE CAPSULE BY MOUTH TWICE DAILY. (MORNING ,EVENING) 03/14/18  Yes [provider]  XTANDI 40 MG capsule TAKE 4 CAPSULES (160 MG TOTAL) BY MOUTH DAILY. 02/27/19  Yes Derek Jack, MD  acetaminophen (TYLENOL) 325 MG tablet Take 2 tablets (650 mg total) by mouth every 4 (four) hours as needed for headache or mild pain. 09/12/16   Erlene Quan, PA-C  doxazosin (CARDURA) 4 MG tablet TAKE ONE TABLET BY MOUTH DAILY. (,EVENING) 12/28/18   Satira Sark, MD    ketoconazole (NIZORAL) 2 % cream APPLY TWICE DAILY TO THE BOTTOMS OF BOTH FEET. 12/29/18   [provider]  magnesium citrate SOLN TAKE ONE BOTTLE BY MOUTH ONCE FOR ONE DOSE. 02/23/19   [provider]  nitroGLYCERIN (NITROSTAT) 0.4 MG SL tablet PLACE ONE (1) TABLET UNDER TONGUE EVERY 5 MINUTES UP TO (3) DOSES AS NEEDED FOR CHEST PAIN. 03/16/17   Satira Sark, MD  PRESCRIPTION MEDICATION Hormone shots done at Dr. Ralene Muskrat office once every 4 months (for prostrate) - last injection mid December 2016    [provider]  traMADol (ULTRAM) 50 MG tablet Take 0.5 tablets (25 mg total) by mouth daily as needed. 12/01/18   Glennie Isle, NP-C     Allergies:    No Known Allergies   Physical Exam:   Vitals  Blood pressure (!) 172/85, pulse 68, temperature 98.2 F (36.8 C), temperature source Oral, resp. rate 17, height _0  (1.753 m), weight 95.3 kg, SpO2 96 %.  1.  General: axox3 (person, place, time)  2. Psychiatric: euthymic  3. Neurologic: cn2-12 intact, reflexes 2+ symmetric, diffuse with no clonus, motor 5/5 in all 4 ext,   4. HEENMT:  Anicteric, pupils 1.34m symmetric, direct, consensual, near intact, eomi Mmm Neck: no jvd, no bruit,   5. Respiratory : CTAB  6. Cardiovascular : Midline scar, rrr s1, s2, 2/6 sem apex  7. Gastrointestinal:  Abd: obese, slightly distended, nt, +bs  8. Skin:  Ext: no c/c/e, no rash  9.Musculoskeletal:  Good ROM  No adenopathy    Data Review:    CBC Recent Labs  Lab 03/23/19 2355  WBC 6.2  HGB 11.6*  HCT 36.7*  PLT 186  MCV 94.3  MCH 29.8  MCHC 31.6  RDW 14.0  LYMPHSABS 1.3  MONOABS 0.5  EOSABS 0.3  BASOSABS 0.0   ------------------------------------------------------------------------------------------------------------------  Results for orders placed or performed during  the hospital encounter of 03/23/19 (from the past 48 hour(s))  Urinalysis, Routine w reflex microscopic     Status:  Abnormal   Collection Time: 03/23/19 11:27 PM  Result Value Ref Range   Color, Urine YELLOW YELLOW   APPearance CLEAR CLEAR   Specific Gravity, Urine 1.013 1.005 - 1.030   pH 5.0 5.0 - 8.0   Glucose, UA NEGATIVE NEGATIVE mg/dL   Hgb urine dipstick MODERATE (A) NEGATIVE   Bilirubin Urine NEGATIVE NEGATIVE   Ketones, ur NEGATIVE NEGATIVE mg/dL   Protein, ur NEGATIVE NEGATIVE mg/dL   Nitrite NEGATIVE NEGATIVE   Leukocytes,Ua TRACE (A) NEGATIVE   RBC / HPF 11-20 0 - 5 RBC/hpf   WBC, UA 21-50 0 - 5 WBC/hpf   Bacteria, UA RARE (A) NONE SEEN   Mucus PRESENT     Comment: Performed at [facility], [address] [place], [place] [address]  CBC with Differential/Platelet     Status: Abnormal   Collection Time: 03/23/19 11:55 PM  Result Value Ref Range   WBC 6.2 4.0 - 10.5 K/uL   RBC 3.89 (L) 4.22 - 5.81 MIL/uL   Hemoglobin 11.6 (L) 13.0 - 17.0 g/dL   HCT 36.7 (L) 39.0 - 52.0 %   MCV 94.3 80.0 - 100.0 fL   MCH 29.8 26.0 - 34.0 pg   MCHC 31.6 30.0 - 36.0 g/dL   RDW 14.0 11.5 - 15.5 %   Platelets 186 150 - 400 K/uL   nRBC 0.0 0.0 - 0.2 %   Neutrophils Relative % 65 %   Neutro Abs 4.1 1.7 - 7.7 K/uL   Lymphocytes Relative 21 %   Lymphs Abs 1.3 0.7 - 4.0 K/uL   Monocytes Relative 8 %   Monocytes Absolute 0.5 0.1 - 1.0 K/uL   Eosinophils Relative 5 %   Eosinophils Absolute 0.3 0.0 - 0.5 K/uL   Basophils Relative 1 %   Basophils Absolute 0.0 0.0 - 0.1 K/uL   Immature Granulocytes 0 %   Abs Immature Granulocytes 0.01 0.00 - 0.07 K/uL    Comment: Performed at [facility], [address] [place], [place] [address]  Comprehensive metabolic panel     Status: Abnormal   Collection Time: 03/23/19 11:55 PM  Result Value Ref Range   Sodium 142 135 - 145 mmol/L   Potassium 3.9 3.5 - 5.1 mmol/L   Chloride 112 (H) 98 - 111 mmol/L   CO2 22 22 - 32 mmol/L   Glucose, Bld 100 (H) 70 - 99 mg/dL   BUN 20 8 - 23 mg/dL   Creatinine, Ser 0.89 0.61 - 1.24 mg/dL   Calcium 8.4 (L) 8.9 - 10.3 mg/dL    Total Protein 6.7 6.5 - 8.1 g/dL   Albumin 3.9 3.5 - 5.0 g/dL   AST 15 15 - 41 U/L   ALT 13 0 - 44 U/L   Alkaline Phosphatase 34 (L) 38 - 126 U/L   Total Bilirubin 0.9 0.3 - 1.2 mg/dL   GFR calc non Af Amer >60 >60 mL/min   GFR calc Af Amer >60 >60 mL/min   Anion gap 8 5 - 15    Comment: Performed at [facility], [address] [place], [place] [address]  Lipase, blood     Status: None   Collection Time: 03/23/19 11:55 PM  Result Value Ref Range   Lipase 24 11 - 51 U/L    Comment: Performed at [facility], [address] [place], [place] [address]  Troponin I -  ONCE - STAT     Status: None   Collection Time: 03/23/19 11:55 PM  Result Value Ref Range   Troponin I <0.03 <0.03 ng/mL    Comment: Performed at [facility], [address], [place], [place] [address]    Chemistries  Recent Labs  Lab 03/23/19 2355  NA 142  K 3.9  CL 112*  CO2 22  GLUCOSE 100*  BUN 20  CREATININE 0.89  CALCIUM 8.4*  AST 15  ALT 13  ALKPHOS 34*  BILITOT 0.9   ------------------------------------------------------------------------------------------------------------------  ------------------------------------------------------------------------------------------------------------------ GFR: Estimated Creatinine Clearance: 70.3 mL/min (by C-G formula based on SCr of 0.89 mg/dL). Liver Function Tests: Recent Labs  Lab 03/23/19 2355  AST 15  ALT 13  ALKPHOS 34*  BILITOT 0.9  PROT 6.7  ALBUMIN 3.9   Recent Labs  Lab 03/23/19 2355  LIPASE 24   No results for input(s): AMMONIA in the last 168 hours. Coagulation Profile: No results for input(s): INR, PROTIME in the last 168 hours. Cardiac Enzymes: Recent Labs  Lab 03/23/19 2355  TROPONINI <0.03   BNP (last 3 results) No results for input(s): PROBNP in the last 8760 hours. HbA1C: No results for input(s): HGBA1C in the last 72 hours. CBG: No results for input(s): GLUCAP in the last 168 hours. Lipid Profile: No results for  input(s): CHOL, HDL, LDLCALC, TRIG, CHOLHDL, LDLDIRECT in the last 72 hours. Thyroid Function Tests: No results for input(s): TSH, T4TOTAL, FREET4, T3FREE, THYROIDAB in the last 72 hours. Anemia Panel: No results for input(s): VITAMINB12, FOLATE, FERRITIN, TIBC, IRON, RETICCTPCT in the last 72 hours.  --------------------------------------------------------------------------------------------------------------- Urine analysis:    Component Value Date/Time   COLORURINE YELLOW 03/23/2019 2327   APPEARANCEUR CLEAR 03/23/2019 2327   LABSPEC 1.013 03/23/2019 2327   PHURINE 5.0 03/23/2019 2327   GLUCOSEU NEGATIVE 03/23/2019 2327   HGBUR MODERATE (A) 03/23/2019 2327   BILIRUBINUR NEGATIVE 03/23/2019 2327   KETONESUR NEGATIVE 03/23/2019 2327   PROTEINUR NEGATIVE 03/23/2019 2327   NITRITE NEGATIVE 03/23/2019 2327   LEUKOCYTESUR TRACE (A) 03/23/2019 2327      Imaging Results:    Ct Head Wo Contrast  Result Date: 03/24/2019 CLINICAL DATA:  85 y/o  M; Vertigo, episodic, peripheral. EXAM: CT HEAD WITHOUT CONTRAST TECHNIQUE: Contiguous axial images were obtained from the base of the skull through the vertex without intravenous contrast. COMPARISON:  None. FINDINGS: Brain: No evidence of acute infarction, hemorrhage, hydrocephalus, extra-axial collection or mass lesion/mass effect. Nonspecific white matter hypodensities are compatible with chronic microvascular ischemic changes and there is volume loss brain. Partially empty sella turcica. Vascular: Calcific atherosclerosis of carotid siphons and the vertebral arteries. No hyperdense vessel identified. Skull: Normal. Negative for fracture or focal lesion. Sinuses/Orbits: No acute finding. Other: None. IMPRESSION: 1. No acute intracranial abnormality identified. 2. Chronic microvascular ischemic changes and volume loss of the brain. Electronically Signed   By: Kristine Garbe M.D.   On: 03/24/2019 01:21   Ct Abdomen Pelvis W Contrast  Result  Date: 03/24/2019 CLINICAL DATA:  Abdominal distension EXAM: CT ABDOMEN AND PELVIS WITH CONTRAST TECHNIQUE: Multidetector CT imaging of the abdomen and pelvis was performed using the standard protocol following bolus administration of intravenous contrast. CONTRAST:  161m OMNIPAQUE IOHEXOL 300 MG/ML  SOLN COMPARISON:  12/07/2017 PET CT FINDINGS: Lower chest: No acute abnormality Hepatobiliary: No focal hepatic abnormality. Gallbladder unremarkable. Pancreas: No focal abnormality or ductal dilatation. Spleen: No focal abnormality.  Normal size. Adrenals/Urinary Tract: Severe bilateral hydronephrosis, right greater than left. The urinary bladder  is distended above the level of the umbilicus. Severely trabeculated bladder walls. Adrenal glands unremarkable. Stomach/Bowel: Sigmoid diverticulosis. No active diverticulitis. Stomach and small bowel decompressed, unremarkable. Vascular/Lymphatic: Aortic atherosclerosis. No enlarged abdominal or pelvic lymph nodes. Reproductive: No visible focal abnormality. Other: No free fluid or free air. Musculoskeletal: Sclerotic focus within the right iliac bone measures 10 mm. Sclerotic focus in the anterior T12 vertebral body measures 10 mm. These have enlarged slightly since prior study. IMPRESSION: Severe bilateral hydronephrosis. Urinary bladder is distended and severely trabeculated. 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 Aortic atherosclerosis. Electronically Signed   By: Rolm Baptise M.D.   On: 03/24/2019 01:21   EKG nsr at 65, nl axis, nl int, t inversion in AVL, no st-t changes c/w ischemia   Assessment & Plan:    Principal Problem:   Bilateral hydronephrosis Active Problems:   Prostate cancer ([place])   Urinary retention   Uncontrolled hypertension   Acute lower UTI   Anemia   Bilateral hydronephrosis, Metastatic Castration Resistant Prostate Cancer Transfer to [facility] for urologic  evaluation of suprapubic catheter placement Check PT, PTT NPO except for medications Cont [facility] Urology consulted by ED, Dr. Estill Dooms apparently aware Order placed for IR, Please confirm IR consult for suprapubic catheter in AM  Acute UTI Rocephin 1gm iv qday  Hypertensive urgency, Uncontrolled hypertension Tele Cont Metoprolol 70m po bid Cont Norvasc 567mpo qday Cont Doxazosin 32m42mo bid Cont Imdur 28m22m qday Start Hydralazine 25mg42mtid Start hydralazine 10mg 69m6h prn sbp >160  Pafib HOLD Eliquis   CAD s/p CABG Cont Crestor 20mg p31ms Cont Metoprolol, see above Cont Imdur,  see above Cont Norvasc, see above HOLD Plavix  Anemia Check cbc in am  Gerd Cont PPI   DVT Prophylaxis-   SCD  AM Labs Ordered, also please review Full Orders  Family Communication: Admission, patients condition and plan of care including tests being ordered have been discussed with the patient  who indicate understanding and agree with the plan and Code Status.  Code Status:   FULL CODE  Admission status:  Observationt: Based on patients clinical presentation and evaluation of above clinical data, I have made determination that patient meets Observation criteria at this time. Pt requires hospitalization for hypertensive urgency/ dizziness secondary to uncontrolled hypertension, as well as bilateral hydronephrosis (severe) secondary to prostate cancer/ bladder outlet obstruction and evaluation for suprapubic catheter  Time spent in minutes : 70   Hagen Tidd KJani Gravel 03/24/2019 at 2:53 AM

## 2019-03-24 NOTE — ED Notes (Signed)
[facility]link arrived  

## 2019-03-24 NOTE — Consult Note (Signed)
Chief Complaint: Patient was seen in consultation today for urinary retention  Referring Physician(s): Dr. Junious Silk  Supervising Physician: Jacqulynn Cadet  Patient Status: [name] - In-pt  History of Present Illness: Elijah Santana is a 85 y.o. male with past medical history of metastatic prostate cancer s/p radiation who developed a severe prostatic urethral stricture leading to urinary retention.  He did have plans for surgical repair vs. Suprapubic tube placement with Dr. Jeffie Pollock next week, however he developed acute worsening of his symptoms prompting him to present to [facility] ED yesterday.  Patient was transferred to [facility] for ongoing evaluation and management.  IR now consulted for suprapubic catheter placement at the request of Dr. Junious Silk.   Case reviewed and approved by Dr. Laurence Ferrari.  Patient has been NPO today.  He is on blood thinners at home for extensive cardiac history.  Past Medical History:  Diagnosis Date  . Arthritis   . Asthma    Childhood  . Chronic back pain   . Coronary atherosclerosis of native coronary artery    a. CABG 2004 with LIMA-LAD, SVG-D1, SVG-OM, SVG-PDA. b. Cath ~2010 with occ of SVG-diagonal, distal LAD and OM filiing by collaterals. c. NSTEMI 12/2014 s/p DES to SVG-RCA. d. 11/2015: DES to distal graft of the SVG-distal RCA  . Essential hypertension   . Foley catheter in place   . GERD (gastroesophageal reflux disease)   . Hard of hearing   . Hyperglycemia   . Ischemic cardiomyopathy    a. EF 45% by cath 12/2014.  . Mixed hyperlipidemia   . NSTEMI (non-ST elevated myocardial infarction) ([place]) 01/01/15  . Paroxysmal atrial fibrillation (HCC)   . Pneumonia    hx of   . Prostate cancer [facility])    Radiation therapy 1996  . Skin cancer   . Stroke [facility])    TIA- 28 years ago     Past Surgical History:  Procedure Laterality Date  . CARDIAC CATHETERIZATION  01/01/2015   Procedure: CORONARY STENT INTERVENTION;  Surgeon: Peter M Martinique, MD;  Location: [facility]  CATH LAB;  Service: Cardiovascular;;  SVG to PDA  . CARDIAC CATHETERIZATION N/A 11/21/2015   Procedure: Left Heart Cath and Cors/Grafts Angiography;  Surgeon: Troy Sine, MD;  Location: [place] CV LAB;  Service: Cardiovascular;  Laterality: N/A;  . CARDIAC CATHETERIZATION N/A 11/21/2015   Procedure: Coronary Stent Intervention;  Surgeon: Troy Sine, MD;  Location: [place] CV LAB;  Service: Cardiovascular;  Laterality: N/A;  . CARDIAC CATHETERIZATION N/A 09/11/2016   Procedure: Left Heart Cath and Cors/Grafts Angiography;  Surgeon: Leonie Man, MD;  Location: [place] CV LAB;  Service: Cardiovascular;  Laterality: N/A;  . CARDIAC CATHETERIZATION N/A 09/11/2016   Procedure: Coronary Stent Intervention;  Surgeon: Leonie Man, MD;  Location: [place] CV LAB;  Service: Cardiovascular;  Laterality: N/A;  . CORONARY ARTERY BYPASS GRAFT  2004   LIMA to LAD, SVG to diagonal, SVG to circumflex, SVG to PDA  . GREEN LIGHT LASER TURP (TRANSURETHRAL RESECTION OF PROSTATE N/A 06/04/2016   Procedure: GREEN LIGHT LASER TURP (TRANSURETHRAL RESECTION OF PROSTATE;  Surgeon: Irine Seal, MD;  Location: [facility] ORS;  Service: Urology;  Laterality: N/A;  . LEFT HEART CATHETERIZATION WITH CORONARY/GRAFT ANGIOGRAM N/A 01/01/2015   Procedure: LEFT HEART CATHETERIZATION WITH Beatrix Fetters;  Surgeon: Peter M Martinique, MD;  Location: [facility] CATH LAB;  Service: Cardiovascular;  Laterality: N/A;  . PERCUTANEOUS CORONARY STENT INTERVENTION (PCI-S)  11/20/2015   distal SVG  with DES       .  TONSILLECTOMY      Allergies: Patient has no known allergies.  Medications: Prior to Admission medications   Medication Sig Start Date End Date Taking? Authorizing Provider  acetaminophen (TYLENOL) 325 MG tablet Take 2 tablets (650 mg total) by mouth every 4 (four) hours as needed for headache or mild pain. 09/12/16  Yes Kilroy, Luke K, PA-C  amLODipine (NORVASC) 5 MG tablet TAKE ONE TABLET BY MOUTH DAILY  (MORNING) 11/22/17  Yes Satira Sark, MD  apixaban (ELIQUIS) 5 MG TABS tablet TAKE ONE TABLET BY MOUTH TWICE DAILY (MORNING ,EVENING) 12/28/18  Yes Satira Sark, MD  bisacodyl (DULCOLAX) 5 MG EC tablet Take 5-10 mg by mouth daily as needed (constipation). Reported on 05/28/2016   Yes [provider]  calcium carbonate (OS-CAL - DOSED IN MG OF ELEMENTAL CALCIUM) 1250 (500 Ca) MG tablet Take 1 tablet by mouth.   Yes [provider]  clopidogrel (PLAVIX) 75 MG tablet TAKE ONE TABLET BY MOUTH EVERY DAY (,EVENING) 03/06/19  Yes Satira Sark, MD  isosorbide mononitrate (IMDUR) 60 MG 24 hr tablet TAKE ONE TABLET BY MOUTH DAILY (MORNING) 03/06/19  Yes Satira Sark, MD  linaclotide [facility]) 290 MCG CAPS capsule Take 1 capsule (290 mcg total) by mouth daily before breakfast. 02/23/19  Yes Setzer, Terri L, NP  metoprolol tartrate (LOPRESSOR) 50 MG tablet TAKE ONE TABLET BY MOUTH TWICE DAILY (MORNING ,EVENING) 11/17/18  Yes Satira Sark, MD  nitroGLYCERIN (NITROSTAT) 0.4 MG SL tablet PLACE ONE (1) TABLET UNDER TONGUE EVERY 5 MINUTES UP TO (3) DOSES AS NEEDED FOR CHEST PAIN. 03/16/17  Yes Satira Sark, MD  pantoprazole (PROTONIX) 40 MG tablet Take 1 tablet (40 mg total) by mouth daily. 12/01/16  Yes Satira Sark, MD  rosuvastatin (CRESTOR) 20 MG tablet TAKE ONE TABLET BY MOUTH DAILY (EVENING) 03/06/19  Yes Satira Sark, MD  STOOL SOFTENER 100 MG capsule TAKE 1 OR 2 CAPSULES BY MOUTH DAILY AS NEEDED. 01/02/19  Yes [provider]  tamsulosin (FLOMAX) 0.4 MG CAPS capsule Take 0.4 mg by mouth as directed. Morning and evening 03/14/18  Yes [provider]  XTANDI 40 MG capsule TAKE 4 CAPSULES (160 MG TOTAL) BY MOUTH DAILY. 02/27/19  Yes Derek Jack, MD  doxazosin (CARDURA) 4 MG tablet TAKE ONE TABLET BY MOUTH DAILY. (,EVENING) 12/28/18   Satira Sark, MD  ketoconazole (NIZORAL) 2 % cream APPLY TWICE DAILY TO THE BOTTOMS OF BOTH FEET.  12/29/18   [provider]  magnesium citrate SOLN TAKE ONE BOTTLE BY MOUTH ONCE FOR ONE DOSE. 02/23/19   [provider]  PRESCRIPTION MEDICATION Hormone shots done at Dr. Ralene Muskrat office once every 4 months (for prostrate) - last injection mid December 2016    [provider]  traMADol (ULTRAM) 50 MG tablet Take 0.5 tablets (25 mg total) by mouth daily as needed. 12/01/18   Glennie Isle, NP-C     Family History  Problem Relation Age of Onset  . Hypertension Father   . Coronary artery disease Father     Social History   Socioeconomic History  . Marital status: Married    Spouse name: Not on file  . Number of children: Not on file  . Years of education: Not on file  . Highest education level: Not on file  Occupational History  . Occupation: Retired    Comment: Office manager  Social Needs  . Financial resource strain: Not on file  . Food insecurity:  Worry: Not on file    Inability: Not on file  . Transportation needs:    Medical: Not on file    Non-medical: Not on file  Tobacco Use  . Smoking status: Former Smoker    Packs/day: 0.50    Years: 27.00    Pack years: 13.50    Types: Cigars    Start date: 07/28/1964    Last attempt to quit: 07/28/1986    Years since quitting: 32.6  . Smokeless tobacco: Former Systems developer    Types: Chew    Quit date: 08/07/2010  . Tobacco comment: never chewed up over a pack/day  Substance and Sexual Activity  . Alcohol use: No    Alcohol/week: 0.0 standard drinks  . Drug use: No  . Sexual activity: Not on file  Lifestyle  . Physical activity:    Days per week: Not on file    Minutes per session: Not on file  . Stress: Not on file  Relationships  . Social connections:    Talks on phone: Not on file    Gets together: Not on file    Attends religious service: Not on file    Active member of club or organization: Not on file    Attends meetings of clubs or organizations: Not on file    Relationship  status: Not on file  Other Topics Concern  . Not on file  Social History Narrative  . Not on file     Review of Systems: A 12 point ROS discussed and pertinent positives are indicated in the HPI above.  All other systems are negative.  Review of Systems  Constitutional: Negative for fatigue and fever.  Respiratory: Negative for cough and shortness of breath.   Cardiovascular: Negative for chest pain.  Gastrointestinal: Negative for abdominal pain.  Genitourinary: Positive for difficulty urinating.  Musculoskeletal: Negative for back pain.  Psychiatric/Behavioral: Negative for behavioral problems and confusion.    Vital Signs: BP (!) 154/84 (BP Location: Right Arm)   Pulse 73   Temp 98.6 F (37 C) (Oral)   Resp 17   Ht 5\' 8"  (1.727 m)   Wt 217 lb 6.4 oz (98.6 kg)   SpO2 96%   BMI 33.06 kg/m   Physical Exam Vitals signs and nursing note reviewed.  Constitutional:      Appearance: Normal appearance.  Cardiovascular:     Rate and Rhythm: Normal rate and regular rhythm.  Pulmonary:     Effort: Pulmonary effort is normal. No respiratory distress.     Breath sounds: Normal breath sounds.  Abdominal:     General: Abdomen is flat. There is no distension.     Palpations: Abdomen is soft.     Tenderness: There is no abdominal tenderness.  Skin:    General: Skin is warm and dry.  Neurological:     General: No focal deficit present.     Mental Status: He is alert. Mental status is at baseline.  Psychiatric:        Mood and Affect: Mood normal.        Behavior: Behavior normal.        Thought Content: Thought content normal.        Judgment: Judgment normal.      MD Evaluation Airway: WNL Heart: WNL Abdomen: WNL Chest/ Lungs: WNL ASA  Classification: 3 Mallampati/Airway Score: One   Imaging: Ct Head Wo Contrast  Result Date: 03/24/2019 CLINICAL DATA:  85 y/o  M; Vertigo, episodic, peripheral. EXAM: CT HEAD  WITHOUT CONTRAST TECHNIQUE: Contiguous axial images  were obtained from the base of the skull through the vertex without intravenous contrast. COMPARISON:  None. FINDINGS: Brain: No evidence of acute infarction, hemorrhage, hydrocephalus, extra-axial collection or mass lesion/mass effect. Nonspecific white matter hypodensities are compatible with chronic microvascular ischemic changes and there is volume loss brain. Partially empty sella turcica. Vascular: Calcific atherosclerosis of carotid siphons and the vertebral arteries. No hyperdense vessel identified. Skull: Normal. Negative for fracture or focal lesion. Sinuses/Orbits: No acute finding. Other: None. IMPRESSION: 1. No acute intracranial abnormality identified. 2. Chronic microvascular ischemic changes and volume loss of the brain. Electronically Signed   By: Kristine Garbe M.D.   On: 03/24/2019 01:21   Ct Abdomen Pelvis W Contrast  Result Date: 03/24/2019 CLINICAL DATA:  Abdominal distension EXAM: CT ABDOMEN AND PELVIS WITH CONTRAST TECHNIQUE: Multidetector CT imaging of the abdomen and pelvis was performed using the standard protocol following bolus administration of intravenous contrast. CONTRAST:  162mL OMNIPAQUE IOHEXOL 300 MG/ML  SOLN COMPARISON:  12/07/2017 PET CT FINDINGS: Lower chest: No acute abnormality Hepatobiliary: No focal hepatic abnormality. Gallbladder unremarkable. Pancreas: No focal abnormality or ductal dilatation. Spleen: No focal abnormality.  Normal size. Adrenals/Urinary Tract: Severe bilateral hydronephrosis, right greater than left. The urinary bladder is distended above the level of the umbilicus. Severely trabeculated bladder walls. Adrenal glands unremarkable. Stomach/Bowel: Sigmoid diverticulosis. No active diverticulitis. Stomach and small bowel decompressed, unremarkable. Vascular/Lymphatic: Aortic atherosclerosis. No enlarged abdominal or pelvic lymph nodes. Reproductive: No visible focal abnormality. Other: No free fluid or free air. Musculoskeletal: Sclerotic  focus within the right iliac bone measures 10 mm. Sclerotic focus in the anterior T12 vertebral body measures 10 mm. These have enlarged slightly since prior study. IMPRESSION: Severe bilateral hydronephrosis. Urinary bladder is distended and severely trabeculated. 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 Aortic atherosclerosis. Electronically Signed   By: Rolm Baptise M.D.   On: 03/24/2019 01:21    Labs:  CBC: Recent Labs    10/17/18 1021 12/13/18 1045 02/08/19 0819 03/23/19 2355  WBC 6.0 6.4 6.0 6.2  HGB 12.5* 13.0 12.9* 11.6*  HCT 40.2 42.4 41.2 36.7*  PLT 195 197 195 186    COAGS: Recent Labs    03/24/19 0853  INR 1.2    BMP: Recent Labs    01/11/19 1002 02/08/19 0819 03/08/19 1229 03/23/19 2355  NA 140 141 139 142  K 4.2 4.8 4.9 3.9  CL 110 112* 110 112*  CO2 24 25 24 22   GLUCOSE 111* 109* 115* 100*  BUN 15 18 20 20   CALCIUM 8.6* 9.0 8.5* 8.4*  CREATININE 0.69 0.79 0.82 0.89  GFRNONAA >60 >60 >60 >60  GFRAA >60 >60 >60 >60    LIVER FUNCTION TESTS: Recent Labs    01/11/19 1002 02/08/19 0819 03/08/19 1229 03/23/19 2355  BILITOT 0.8 0.4 0.5 0.9  AST 14* 15 13* 15  ALT 13 13 13 13   ALKPHOS 32* 35* 30* 34*  PROT 6.4* 7.0 6.6 6.7  ALBUMIN 3.9 4.2 3.8 3.9    TUMOR MARKERS: No results for input(s): AFPTM, CEA, CA199, CHROMGRNA in the last 8760 hours.  Assessment and Plan: Urinary Retention Patient with prostatic urethral stricture with chronic urinary retention.  He was planning for intervention with Dr. Jeffie Pollock in the next few days, however his symptoms worsened and he is in need of urgent suprapubic catheter placement today.  He is on eliquis and  Plavix due to cardiac history.  The patient believes he last took on Wednesday as he did not take all his usual medications prior to presenting to the ED yesterday.  Case reviewed and approved by Dr. Laurence Ferrari.  Plan to proceed  with intervention today.  Risk discussed with patient who understands and wishes to proceed as well.   Risks and benefits discussed with the patient including bleeding, infection, damage to adjacent structures, and sepsis.  All of the patient's questions were answered, patient is agreeable to proceed. Consent signed and in chart.  Thank you for this interesting consult.  I greatly enjoyed meeting Elijah Santana and look forward to participating in their care.  A copy of this report was sent to the requesting provider on this date.  Electronically Signed: Docia Barrier, PA 03/24/2019, 11:24 AM   I spent a total of 40 Minutes    in face to face in clinical consultation, greater than 50% of which was counseling/coordinating care for urinary retention.

## 2019-03-25 LAB — CBC WITH DIFFERENTIAL/PLATELET
Abs Immature Granulocytes: 0.02 10*3/uL (ref 0.00–0.07)
Basophils Absolute: 0 10*3/uL (ref 0.0–0.1)
Basophils Relative: 1 %
Eosinophils Absolute: 0.2 10*3/uL (ref 0.0–0.5)
Eosinophils Relative: 3 %
HCT: 40.4 % (ref 39.0–52.0)
Hemoglobin: 12.1 g/dL — ABNORMAL LOW (ref 13.0–17.0)
Immature Granulocytes: 0 %
Lymphocytes Relative: 25 %
Lymphs Abs: 1.6 10*3/uL (ref 0.7–4.0)
MCH: 29.1 pg (ref 26.0–34.0)
MCHC: 30 g/dL (ref 30.0–36.0)
MCV: 97.1 fL (ref 80.0–100.0)
Monocytes Absolute: 0.5 10*3/uL (ref 0.1–1.0)
Monocytes Relative: 8 %
Neutro Abs: 4.2 10*3/uL (ref 1.7–7.7)
Neutrophils Relative %: 63 %
Platelets: 209 10*3/uL (ref 150–400)
RBC: 4.16 MIL/uL — ABNORMAL LOW (ref 4.22–5.81)
RDW: 14.2 % (ref 11.5–15.5)
WBC: 6.6 10*3/uL (ref 4.0–10.5)
nRBC: 0 % (ref 0.0–0.2)

## 2019-03-25 LAB — RENAL FUNCTION PANEL
Albumin: 3.7 g/dL (ref 3.5–5.0)
Anion gap: 10 (ref 5–15)
BUN: 14 mg/dL (ref 8–23)
CO2: 25 mmol/L (ref 22–32)
Calcium: 8.6 mg/dL — ABNORMAL LOW (ref 8.9–10.3)
Chloride: 106 mmol/L (ref 98–111)
Creatinine, Ser: 0.91 mg/dL (ref 0.61–1.24)
GFR calc Af Amer: >60 mL/min (ref >60)
GFR calc non Af Amer: >60 mL/min (ref >60)
Glucose, Bld: 104 mg/dL — ABNORMAL HIGH (ref 70–99)
Phosphorus: 3 mg/dL (ref 2.5–4.6)
Potassium: 3.3 mmol/L — ABNORMAL LOW (ref 3.5–5.1)
Sodium: 141 mmol/L (ref 135–145)

## 2019-03-25 LAB — URINE CULTURE: Culture: NO GROWTH

## 2019-03-25 NOTE — Discharge Instructions (Signed)
Suprapubic Catheter Home Guide  A suprapubic catheter is a rubber tube used to drain urine from the bladder into a collection bag. The catheter is inserted into the bladder through a small opening in the in the lower abdomen, near the center of the body, above the pubic bone (suprapubic area). There is a tiny balloon filled with germ-free (sterile) water on the end of the catheter that is in the bladder. The balloon helps to keep the catheter in place.  Your suprapubic catheter may need to be replaced every 4-6 weeks, or as often as recommended by your health care provider. The collection bag must be emptied every day and cleaned every 2-3 days. The collection bag can be put beside your bed at night and attached to your leg during the day. You may have a large collection bag to use at night and a smaller one to use during the day.  What are the risks?   Urine flow can become blocked. This can happen if the catheter is not working correctly, or if you have a blood clot in your bladder or in the catheter.   Tissue near the catheter may can become irritated and bleed.   Bacteria may get into your bladder and cause a urinary tract infection.  How do I change the catheter?  Supplies needed   Two pairs of sterile gloves.   Catheter.   Two syringes.   Sterile water.   Sterile cleaning solution.   Lubricant.   Collection bags.  Changing the catheter  To replace your catheter, take the following steps:  1. Drink plenty of fluids during the hours before you plan to change the catheter.  2. Wash your hands with soap and water. If soap and water are not available, use hand sanitizer.  3. Lie on your back and put on sterile gloves.  4. Clean the skin around the catheter opening using the sterile cleaning solution.  5. Remove the water from the balloon using a syringe.  6. Slowly remove the catheter.  ? Do not pull on the catheter if it seems stuck.  ? Call your health care provider immediately if you have difficulty  removing the catheter.  7. Take off the used gloves, and put on a new pair.  8. Put lubricant on the end of the new catheter that will go into your bladder.  9. Gently slide the catheter through the opening in your abdomen and into your bladder.  10. Wait for some urine to start flowing through the catheter. When urine starts to flow through the catheter, use a new syringe to fill the balloon with sterile water.  11. Attach the collection bag to the end of the catheter. Make sure the connection is tight.  12. Remove the gloves and wash your hands with soap and water.  How do I care for my skin around the catheter?  Use a clean washcloth and soapy water to clean the skin around your catheter every day. Pat the area dry with a clean towel.   Do not pull on the catheter.   Do not use ointment or lotion on this area unless told by your health care provider.   Check your skin around the catheter every day for signs of infection. Check for:  ? Redness, swelling, or pain.  ? Fluid or blood.  ? Warmth.  ? Pus or a bad smell.  How do I clean and empty the collection bag?  Clean the collection bag every 2-3   days, or as often as told by your health care provider. To do this, take the following steps:   Wash your hands with soap and water. If soap and water are not available, use hand sanitizer.   Disconnect the bag from the catheter and immediately attach a new bag to the catheter.   Empty the used bag completely.   Clean the used bag using one of the following methods:  ? Rinse the used bag with warm water and soap.  ? Fill the bag with water and add 1 tsp of vinegar. Let it sit for about 30 minutes, then empty the bag.   Let the bag dry completely, and put it in a clean plastic bag before storing it.  Empty the large collection bag every 8 hours. Empty the small collection bag when it is about ? full. To empty your large or small collection bag, take the following steps:   Always keep the bag below the level of the  catheter. This keeps urine from flowing backwards into the catheter.   Hold the bag over the toilet or another container. Turn the valve (spigot) at the bottom of the bag to empty the urine.  ? Do not touch the opening of the spigot.  ? Do not let the opening touch the toilet or container.   Close the spigot tightly when the bag is empty.  What are some general tips?     Always wash your hands before and after caring for your catheter and collection bag. Use a mild, fragrance-free soap. If soap and water are not available, use hand sanitizer.   Clean the catheter with soap and water as often as told by your health care provider.   Always make sure there are no twists or curls (kinks) in the catheter tube.   Always make sure there are no leaks in the catheter or collection bag.   Drink enough fluid to keep your urine clear or pale yellow.   Do not take baths, swim, or use a hot tub.  When should I seek medical care?  Seek medical care if:   You leak urine.   You have redness, swelling, or pain around your catheter opening.   You have fluid or blood coming from your catheter opening.   Your catheter opening feels warm to the touch.   You have pus or a bad smell coming from your catheter opening.   You have a fever or chills.   Your urine flow slows down.   Your urine becomes cloudy or smelly.  When should I seek immediate medical care?  Seek immediate medical care if your catheter comes out, or if you have:   Nausea.   Back pain.   Difficulty changing your catheter.   Blood in your urine.   No urine flow for 1 hour.  This information is not intended to replace advice given to you by your health care provider. Make sure you discuss any questions you have with your health care provider.  Document Released: 07/21/2011 Document Revised: 07/01/2016 Document Reviewed: 07/16/2015  [employer] Interactive Patient Education  2019 [employer]

## 2019-03-25 NOTE — Discharge Summary (Signed)
Physician Discharge Summary  Elijah Santana EGB:[id number] DOB: 09-09-1932 DOA: 03/23/2019  PCP: Elijah Chroman, MD  Admit date: 03/23/2019 Discharge date: 03/25/2019  Time spent: 35 minutes  Recommendations for Outpatient Follow-up:  1. Needs outpatient follow-up with Dr. Jeffie Santana 2. Stop doxazosin and Flomax on this admission because no need given suprapubic catheter 3. Will need resumption of both Plavix and Eliquis after procedure by Dr. Jeffie Santana on upcoming Tuesday 5/12 4. Needs outpatient follow-up with cardiology for interval visit as per their protocol I will CC his cardiologist  Discharge Diagnoses:  Principal Problem:   Bilateral hydronephrosis Active Problems:   Prostate cancer [facility])   Urinary retention   Uncontrolled hypertension   Acute lower UTI   Anemia   Discharge Condition: Improved  Diet recommendation: Heart healthy  Filed Weights   03/23/19 2258 03/24/19 0437 03/25/19 0500  Weight: 95.3 kg 98.6 kg 97.7 kg    History of present illness:  85 year old Caucasian male CAD ischemic EF 45%-cardiac cath 09/11/2016 PCI stable medical therapy Paroxysmal A. 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 Delton Santana with bone metastases status post denosumab starting 05/02/2018 History of prior CVA  Admitted from [facility] because of inability to urinate over the past 4 weeks was supposed to have suprapubic catheter placed by urology 03/28/2019 Found to have uncontrolled hypertension probably secondary to the same Eliquis was held Dr. Junious Silk was aware IR saw the patient  Patient was seen by IR on 5/8 had suprapubic catheter placed I discussed with Dr. Jeffie Santana subsequently who wanted the patient to be off of his Eliquis/Plavix He tolerated procedures well ambulated in the hallway independently and hemoglobin was stable  Had mild hypokalemia so this warrants outpatient labs in about a  week  Will need outpatient cardiology follow-up as per their protocols and I will CC his specialists  Met maximal benefit from hospitalization 5/9 and was discharged in stable state with home health to follow-up and teach him how to use suprapubic catheter   Discharge Exam: Vitals:   03/25/19 0421 03/25/19 0841  BP: (!) 179/82   Pulse: 63   Resp: 16   Temp: 98.4 F (36.9 C)   SpO2: 95% 98%    General: Awake alert coherent no distress passing stool EOMI NCAT MobileCardiovascular: S1-S2 no murmur rub or gallop Respiratory: Clinically clear no added sound Abdomen soft nontender suprapubic draining  Discharge Instructions   Discharge Instructions    Diet - low sodium heart healthy   Complete by:  As directed    Discharge instructions   Complete by:  As directed    Do not take any blood thinner until you see Dr. Jeffie Santana We will make sure that you have home health to teach you how to use the bag Do not take doxazosin or tamsulosin until you are seen by him Make sure that you have follow-up in the outpatient setting   Face-to-face encounter (required for Medicare/Medicaid patients)   Complete by:  As directed    I Elijah Santana certify that this patient is under my care and that I, or a nurse practitioner or physician's assistant working with me, had a face-to-face encounter that meets the physician face-to-face encounter requirements with this patient on 03/25/2019. The encounter with the patient was in whole, or in part for the following medical condition(s) which is the primary reason for home health care (List medical condition):   New suprapubic catheter needing attention  The encounter with the patient was in whole, or in part, for the following medical condition, which is the primary reason for home health care:  New suprapubic Foley needing attention   I certify that, based on my findings, the following services are medically necessary home health services:  Nursing    Reason for Medically Necessary Home Health Services:  Skilled Nursing- Assessment and Observation of Wound Status   My clinical findings support the need for the above services:  OTHER SEE COMMENTS   Further, I certify that my clinical findings support that this patient is homebound due to:  Unsafe ambulation due to balance issues   Home Health   Complete by:  As directed    To provide the following care/treatments:   PT RN     Increase activity slowly   Complete by:  As directed      Allergies as of 03/25/2019   No Known Allergies     Medication List    STOP taking these medications   apixaban 5 MG Tabs tablet Commonly known as:  Eliquis   clopidogrel 75 MG tablet Commonly known as:  PLAVIX   doxazosin 4 MG tablet Commonly known as:  CARDURA   tamsulosin 0.4 MG Caps capsule Commonly known as:  FLOMAX     TAKE these medications   acetaminophen 325 MG tablet Commonly known as:  TYLENOL Take 2 tablets (650 mg total) by mouth every 4 (four) hours as needed for headache or mild pain.   amLODipine 5 MG tablet Commonly known as:  NORVASC TAKE ONE TABLET BY MOUTH DAILY (MORNING)   bisacodyl 5 MG EC tablet Commonly known as:  DULCOLAX Take 5-10 mg by mouth daily as needed (constipation). Reported on 05/28/2016   calcium carbonate 1250 (500 Ca) MG tablet Commonly known as:  OS-CAL - dosed in mg of elemental calcium Take 1 tablet by mouth.   isosorbide mononitrate 60 MG 24 hr tablet Commonly known as:  IMDUR TAKE ONE TABLET BY MOUTH DAILY (MORNING)   ketoconazole 2 % cream Commonly known as:  NIZORAL APPLY TWICE DAILY TO THE BOTTOMS OF BOTH FEET.   linaclotide 290 MCG Caps capsule Commonly known as:  Linzess Take 1 capsule (290 mcg total) by mouth daily before breakfast.   magnesium citrate Soln TAKE ONE BOTTLE BY MOUTH ONCE FOR ONE DOSE.   metoprolol tartrate 50 MG tablet Commonly known as:  LOPRESSOR TAKE ONE TABLET BY MOUTH TWICE DAILY (MORNING ,EVENING)    nitroGLYCERIN 0.4 MG SL tablet Commonly known as:  Nitrostat PLACE ONE (1) TABLET UNDER TONGUE EVERY 5 MINUTES UP TO (3) DOSES AS NEEDED FOR CHEST PAIN.   pantoprazole 40 MG tablet Commonly known as:  PROTONIX Take 1 tablet (40 mg total) by mouth daily.   PRESCRIPTION MEDICATION Hormone shots done at Dr. Ralene Muskrat office once every 4 months (for prostrate) - last injection mid December 2016   rosuvastatin 20 MG tablet Commonly known as:  CRESTOR TAKE ONE TABLET BY MOUTH DAILY (EVENING)   Stool Softener 100 MG capsule Generic drug:  docusate sodium TAKE 1 OR 2 CAPSULES BY MOUTH DAILY AS NEEDED.   traMADol 50 MG tablet Commonly known as:  Ultram Take 0.5 tablets (25 mg total) by mouth daily as needed.   Xtandi 40 MG capsule Generic drug:  enzalutamide TAKE 4 CAPSULES (160 MG TOTAL) BY MOUTH DAILY.      No Known Allergies Follow-up Information    Irine Seal, MD Follow up on 03/28/2019.  Specialty:  Urology Why:  Keep scheduled appointment for surgery on 5/12.  Contact information: [address] [place] [address] [phone]            The results of significant diagnostics from this hospitalization (including imaging, microbiology, ancillary and laboratory) are listed below for reference.    Significant Diagnostic Studies: Ct Head Wo Contrast  Result Date: 03/24/2019 CLINICAL DATA:  85 y/o  M; Vertigo, episodic, peripheral. EXAM: CT HEAD WITHOUT CONTRAST TECHNIQUE: Contiguous axial images were obtained from the base of the skull through the vertex without intravenous contrast. COMPARISON:  None. FINDINGS: Brain: No evidence of acute infarction, hemorrhage, hydrocephalus, extra-axial collection or mass lesion/mass effect. Nonspecific white matter hypodensities are compatible with chronic microvascular ischemic changes and there is volume loss brain. Partially empty sella turcica. Vascular: Calcific atherosclerosis of carotid siphons and the vertebral  arteries. No hyperdense vessel identified. Skull: Normal. Negative for fracture or focal lesion. Sinuses/Orbits: No acute finding. Other: None. IMPRESSION: 1. No acute intracranial abnormality identified. 2. Chronic microvascular ischemic changes and volume loss of the brain. Electronically Signed   By: Kristine Garbe M.D.   On: 03/24/2019 01:21   Ct Abdomen Pelvis W Contrast  Result Date: 03/24/2019 CLINICAL DATA:  Abdominal distension EXAM: CT ABDOMEN AND PELVIS WITH CONTRAST TECHNIQUE: Multidetector CT imaging of the abdomen and pelvis was performed using the standard protocol following bolus administration of intravenous contrast. CONTRAST:  1100m OMNIPAQUE IOHEXOL 300 MG/ML  SOLN COMPARISON:  12/07/2017 PET CT FINDINGS: Lower chest: No acute abnormality Hepatobiliary: No focal hepatic abnormality. Gallbladder unremarkable. Pancreas: No focal abnormality or ductal dilatation. Spleen: No focal abnormality.  Normal size. Adrenals/Urinary Tract: Severe bilateral hydronephrosis, right greater than left. The urinary bladder is distended above the level of the umbilicus. Severely trabeculated bladder walls. Adrenal glands unremarkable. Stomach/Bowel: Sigmoid diverticulosis. No active diverticulitis. Stomach and small bowel decompressed, unremarkable. Vascular/Lymphatic: Aortic atherosclerosis. No enlarged abdominal or pelvic lymph nodes. Reproductive: No visible focal abnormality. Other: No free fluid or free air. Musculoskeletal: Sclerotic focus within the right iliac bone measures 10 mm. Sclerotic focus in the anterior T12 vertebral body measures 10 mm. These have enlarged slightly since prior study. IMPRESSION: Severe bilateral hydronephrosis. Urinary bladder is distended and severely trabeculated. 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 Aortic atherosclerosis. Electronically Signed   By: KRolm BaptiseM.D.   On:  03/24/2019 01:21   Ct Image Guided Drainage By Percutaneous Catheter  Result Date: 03/24/2019 INDICATION: 85year old male with a history of bladder outlet obstruction in severe urinary retention. He presents for urgent placement of a suprapubic catheter. EXAM: CT IMAGE GUIDED DRAINAGE BY PERCUTANEOUS CATHETER COMPARISON:  None. MEDICATIONS: 2 g Ancef; The antibiotic was administered in an appropriate time frame prior to skin puncture. ANESTHESIA/SEDATION: Fentanyl 2 mcg IV; Versed 100 mg IV Moderate Sedation Time:  14 minutes The patient was continuously monitored during the procedure by the interventional radiology nurse under my direct supervision. CONTRAST:  None FLUOROSCOPY TIME:  None COMPLICATIONS: None immediate. PROCEDURE: Informed written consent was obtained from the patient after a thorough discussion of the procedural risks, benefits and alternatives. All questions were addressed. Maximal Sterile Barrier Technique was utilized including caps, mask, sterile gowns, sterile gloves, sterile drape, hand hygiene and skin antiseptic. A timeout was performed prior to the initiation of the procedure. A planning axial CT scan was performed. The bladder is markedly distended. The ureters  are also distended and filled with intravenous contrast. A suitable skin entry site was selected and marked. The overlying skin was sterilely prepped and draped in the standard fashion with chlorhexidine skin prep. Local anesthesia was attained by infiltration with 1% lidocaine. A small dermatotomy was made. Under intermittent CT guidance, an 18 gauge introducer needle was advanced through the midline low anterior abdominal wall and into the bladder. A wire was then advanced into the bladder lumen. The tract was serially dilated to 64 [place] and a Cook 14 [place] all-purpose drainage catheter advanced into the bladder. The catheter was connected to gravity bag drainage and approximately 1600 mL of urine was drained. The  suprapubic tube was secured to the skin with 0 Prolene suture and an adhesive fixation device. The patient tolerated the procedure well. There was no immediate complication. IMPRESSION: Technically successful placement of a 14 French suprapubic catheter. Electronically Signed   By: Jacqulynn Cadet M.D.   On: 03/24/2019 14:04    Microbiology: Recent Results (from the past 240 hour(s))  SARS Coronavirus 2 (CEPHEID - Performed in [place] hospital lab), Hosp Order     Status: None   Collection Time: 03/24/19  1:57 AM  Result Value Ref Range Status   SARS Coronavirus 2 NEGATIVE NEGATIVE Final    Comment: (NOTE) If result is NEGATIVE SARS-CoV-2 target nucleic acids are NOT DETECTED. The SARS-CoV-2 RNA is generally detectable in upper and lower  respiratory specimens during the acute phase of infection. The lowest  concentration of SARS-CoV-2 viral copies this assay can detect is 250  copies / mL. A negative result does not preclude SARS-CoV-2 infection  and should not be used as the sole basis for treatment or other  patient management decisions.  A negative result may occur with  improper specimen collection / handling, submission of specimen other  than nasopharyngeal swab, presence of viral mutation(s) within the  areas targeted by this assay, and inadequate number of viral copies  (<250 copies / mL). A negative result must be combined with clinical  observations, patient history, and epidemiological information. If result is POSITIVE SARS-CoV-2 target nucleic acids are DETECTED. The SARS-CoV-2 RNA is generally detectable in upper and lower  respiratory specimens dur ing the acute phase of infection.  Positive  results are indicative of active infection with SARS-CoV-2.  Clinical  correlation with patient history and other diagnostic information is  necessary to determine patient infection status.  Positive results do  not rule out bacterial infection or co-infection with other  viruses. If result is PRESUMPTIVE POSTIVE SARS-CoV-2 nucleic acids MAY BE PRESENT.   A presumptive positive result was obtained on the submitted specimen  and confirmed on repeat testing.  While 2019 novel coronavirus  (SARS-CoV-2) nucleic acids may be present in the submitted sample  additional confirmatory testing may be necessary for epidemiological  and / or clinical management purposes  to differentiate between  SARS-CoV-2 and other Sarbecovirus currently known to infect humans.  If clinically indicated additional testing with an alternate test  methodology [phone]) is advised. The SARS-CoV-2 RNA is generally  detectable in upper and lower respiratory sp ecimens during the acute  phase of infection. The expected result is Negative. Fact Sheet for Patients:  StrictlyIdeas.no Fact Sheet for Healthcare Providers: BankingDealers.co.za This test is not yet approved or cleared by the [place] FDA and has been authorized for detection and/or diagnosis of SARS-CoV-2 by FDA under an Emergency Use Authorization (EUA).  This EUA will remain in effect (  meaning this test can be used) for the duration of the COVID-19 declaration under Section 564(b)(1) of the Act, 21 U.S.C. section 360bbb-3(b)(1), unless the authorization is terminated or revoked sooner. Performed at [facility], [address], [place], [place] [address]      Labs: Basic Metabolic Panel: Recent Labs  Lab 03/23/19 2355 03/25/19 0716  NA 142 141  K 3.9 3.3*  CL 112* 106  CO2 22 25  GLUCOSE 100* 104*  BUN 20 14  CREATININE 0.89 0.91  CALCIUM 8.4* 8.6*  PHOS  --  3.0   Liver Function Tests: Recent Labs  Lab 03/23/19 2355 03/25/19 0716  AST 15  --   ALT 13  --   ALKPHOS 34*  --   BILITOT 0.9  --   PROT 6.7  --   ALBUMIN 3.9 3.7   Recent Labs  Lab 03/23/19 2355  LIPASE 24   No results for input(s): AMMONIA in the last 168 hours. CBC: Recent Labs   Lab 03/23/19 2355 03/25/19 0716  WBC 6.2 6.6  NEUTROABS 4.1 4.2  HGB 11.6* 12.1*  HCT 36.7* 40.4  MCV 94.3 97.1  PLT 186 209   Cardiac Enzymes: Recent Labs  Lab 03/23/19 2355  TROPONINI <0.03   BNP: BNP (last 3 results) No results for input(s): BNP in the last 8760 hours.  ProBNP (last 3 results) No results for input(s): PROBNP in the last 8760 hours.  CBG: No results for input(s): GLUCAP in the last 168 hours.     Signed:  [name] MD   [facility] 03/25/2019, 9:14 AM

## 2019-03-25 NOTE — Progress Notes (Signed)
Patient was instructed on how to empty suprapubic bag.  Needs further teaching on Changing dressing around supra pubic cath and reinforcement of emptying Suprapubic bag as well as Signs and symptoms of infection regarding suprapubic cath.

## 2019-03-25 NOTE — H&P (View-Only) (Signed)
Subjective: Elijah Santana is doing well since SP tube placement.  He has had a good BM and has no complaints.    ROS:  Review of Systems  All other systems reviewed and are negative.   Anti-infectives: Anti-infectives (From admission, onward)   Start     Dose/Rate Route Frequency Ordered Stop   03/24/19 1045  ceFAZolin (ANCEF) 2-4 GM/100ML-% IVPB    Note to Pharmacy:  Lesia Hausen   : cabinet override      03/24/19 1045 03/24/19 1115   03/24/19 0345  cefTRIAXone (ROCEPHIN) 1 g in sodium chloride 0.9 % 100 mL IVPB     1 g 200 mL/hr over 30 Minutes Intravenous Every 24 hours 03/24/19 0340        Current Facility-Administered Medications  Medication Dose Route Frequency Provider Last Rate Last Dose  . 0.9 %  sodium chloride infusion  250 mL Intravenous PRN Jani Gravel, MD 10 mL/hr at 03/25/19 0401 10 mL/hr at 03/25/19 0401  . acetaminophen (TYLENOL) tablet 650 mg  650 mg Oral Q6H PRN Jani Gravel, MD   650 mg at 03/25/19 0356   Or  . acetaminophen (TYLENOL) suppository 650 mg  650 mg Rectal Q6H PRN Jani Gravel, MD      . amLODipine (NORVASC) tablet 5 mg  5 mg Oral Daily Jani Gravel, MD   5 mg at 03/24/19 0951  . bisacodyl (DULCOLAX) EC tablet 5-10 mg  5-10 mg Oral Daily PRN Jani Gravel, MD      . calcium carbonate (OS-CAL - dosed in mg of elemental calcium) tablet 500 mg of elemental calcium  1 tablet Oral Q breakfast Jani Gravel, MD   500 mg of elemental calcium at 03/25/19 0815  . cefTRIAXone (ROCEPHIN) 1 g in sodium chloride 0.9 % 100 mL IVPB  1 g Intravenous Q24H Jani Gravel, MD 200 mL/hr at 03/25/19 0402 1 g at 03/25/19 0402  . hydrALAZINE (APRESOLINE) injection 10 mg  10 mg Intravenous Q6H PRN Jani Gravel, MD   10 mg at 03/25/19 0459  . hydrALAZINE (APRESOLINE) tablet 25 mg  25 mg Oral TID Jani Gravel, MD   25 mg at 03/24/19 2200  . isosorbide mononitrate (IMDUR) 24 hr tablet 60 mg  60 mg Oral Daily Jani Gravel, MD   60 mg at 03/24/19 9528  . linaclotide (LINZESS) capsule 290 mcg  290 mcg Oral  QAC breakfast Jani Gravel, MD   290 mcg at 03/25/19 0815  . metoprolol tartrate (LOPRESSOR) tablet 50 mg  50 mg Oral BID Jani Gravel, MD   50 mg at 03/24/19 2200  . pantoprazole (PROTONIX) EC tablet 40 mg  40 mg Oral Daily Jani Gravel, MD   40 mg at 03/24/19 4132  . rosuvastatin (CRESTOR) tablet 20 mg  20 mg Oral q1800 Jani Gravel, MD   20 mg at 03/24/19 1625  . sodium chloride flush (NS) 0.9 % injection 3 mL  3 mL Intravenous Q12H Jani Gravel, MD   3 mL at 03/24/19 2236  . sodium chloride flush (NS) 0.9 % injection 3 mL  3 mL Intravenous PRN Jani Gravel, MD      . tamsulosin [facility]) capsule 0.4 mg  0.4 mg Oral BID Jani Gravel, MD   0.4 mg at 03/25/19 0815     Objective: Vital signs in last 24 hours: Temp:  [97.8 F (36.6 C)-99.1 F (37.3 C)] 98.4 F (36.9 C) (05/09 0421) Pulse Rate:  [60-73] 63 (05/09 0421) Resp:  [16-19] 16 (05/09 0421) BP: (123-179)/(58-88)  179/82 (05/09 0421) SpO2:  [92 %-98 %] 98 % (05/09 0841) Weight:  [97.7 kg] 97.7 kg (05/09 0500)  Intake/Output from previous day: 05/08 0701 - 05/09 0700 In: 48 [P.O.:480; IV Piggyback:100] Out: 2955 [Urine:925; Drains:2030] Intake/Output this shift: Total I/O In: -  Out: 400 [Drains:400]   Physical Exam Vitals signs reviewed.  Constitutional:      Appearance: Normal appearance.  Abdominal:     Comments: SP tube draining well with minimally pink urine.   Neurological:     Mental Status: He is alert.     Lab Results:  Recent Labs    03/23/19 2355 03/25/19 0716  WBC 6.2 6.6  HGB 11.6* 12.1*  HCT 36.7* 40.4  PLT 186 209   BMET Recent Labs    03/23/19 2355 03/25/19 0716  NA 142 141  K 3.9 3.3*  CL 112* 106  CO2 22 25  GLUCOSE 100* 104*  BUN 20 14  CREATININE 0.89 0.91  CALCIUM 8.4* 8.6*   PT/INR Recent Labs    03/24/19 0853  LABPROT 14.9  INR 1.2   ABG No results for input(s): PHART, HCO3 in the last 72 hours.  Invalid input(s): PCO2, PO2  Studies/Results: Ct Head Wo Contrast  Result  Date: 03/24/2019 CLINICAL DATA:  85 y/o  M; Vertigo, episodic, peripheral. EXAM: CT HEAD WITHOUT CONTRAST TECHNIQUE: Contiguous axial images were obtained from the base of the skull through the vertex without intravenous contrast. COMPARISON:  None. FINDINGS: Brain: No evidence of acute infarction, hemorrhage, hydrocephalus, extra-axial collection or mass lesion/mass effect. Nonspecific white matter hypodensities are compatible with chronic microvascular ischemic changes and there is volume loss brain. Partially empty sella turcica. Vascular: Calcific atherosclerosis of carotid siphons and the vertebral arteries. No hyperdense vessel identified. Skull: Normal. Negative for fracture or focal lesion. Sinuses/Orbits: No acute finding. Other: None. IMPRESSION: 1. No acute intracranial abnormality identified. 2. Chronic microvascular ischemic changes and volume loss of the brain. Electronically Signed   By: Kristine Garbe M.D.   On: 03/24/2019 01:21   Ct Abdomen Pelvis W Contrast  Result Date: 03/24/2019 CLINICAL DATA:  Abdominal distension EXAM: CT ABDOMEN AND PELVIS WITH CONTRAST TECHNIQUE: Multidetector CT imaging of the abdomen and pelvis was performed using the standard protocol following bolus administration of intravenous contrast. CONTRAST:  137mL OMNIPAQUE IOHEXOL 300 MG/ML  SOLN COMPARISON:  12/07/2017 PET CT FINDINGS: Lower chest: No acute abnormality Hepatobiliary: No focal hepatic abnormality. Gallbladder unremarkable. Pancreas: No focal abnormality or ductal dilatation. Spleen: No focal abnormality.  Normal size. Adrenals/Urinary Tract: Severe bilateral hydronephrosis, right greater than left. The urinary bladder is distended above the level of the umbilicus. Severely trabeculated bladder walls. Adrenal glands unremarkable. Stomach/Bowel: Sigmoid diverticulosis. No active diverticulitis. Stomach and small bowel decompressed, unremarkable. Vascular/Lymphatic: Aortic atherosclerosis. No enlarged  abdominal or pelvic lymph nodes. Reproductive: No visible focal abnormality. Other: No free fluid or free air. Musculoskeletal: Sclerotic focus within the right iliac bone measures 10 mm. Sclerotic focus in the anterior T12 vertebral body measures 10 mm. These have enlarged slightly since prior study. IMPRESSION: Severe bilateral hydronephrosis. Urinary bladder is distended and severely trabeculated. 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 Aortic atherosclerosis. Electronically Signed   By: Rolm Baptise M.D.   On: 03/24/2019 01:21   Ct Image Guided Drainage By Percutaneous Catheter  Result Date: 03/24/2019 INDICATION: 85 year old male with a history of bladder outlet obstruction in severe urinary retention. He presents  for urgent placement of a suprapubic catheter. EXAM: CT IMAGE GUIDED DRAINAGE BY PERCUTANEOUS CATHETER COMPARISON:  None. MEDICATIONS: 2 g Ancef; The antibiotic was administered in an appropriate time frame prior to skin puncture. ANESTHESIA/SEDATION: Fentanyl 2 mcg IV; Versed 100 mg IV Moderate Sedation Time:  14 minutes The patient was continuously monitored during the procedure by the interventional radiology nurse under my direct supervision. CONTRAST:  None FLUOROSCOPY TIME:  None COMPLICATIONS: None immediate. PROCEDURE: Informed written consent was obtained from the patient after a thorough discussion of the procedural risks, benefits and alternatives. All questions were addressed. Maximal Sterile Barrier Technique was utilized including caps, mask, sterile gowns, sterile gloves, sterile drape, hand hygiene and skin antiseptic. A timeout was performed prior to the initiation of the procedure. A planning axial CT scan was performed. The bladder is markedly distended. The ureters are also distended and filled with intravenous contrast. A suitable skin entry site was selected and marked. The overlying skin was  sterilely prepped and draped in the standard fashion with chlorhexidine skin prep. Local anesthesia was attained by infiltration with 1% lidocaine. A small dermatotomy was made. Under intermittent CT guidance, an 18 gauge introducer needle was advanced through the midline low anterior abdominal wall and into the bladder. A wire was then advanced into the bladder lumen. The tract was serially dilated to 47 [place] and a Cook 14 [place] all-purpose drainage catheter advanced into the bladder. The catheter was connected to gravity bag drainage and approximately 1600 mL of urine was drained. The suprapubic tube was secured to the skin with 0 Prolene suture and an adhesive fixation device. The patient tolerated the procedure well. There was no immediate complication. IMPRESSION: Technically successful placement of a 14 French suprapubic catheter. Electronically Signed   By: Jacqulynn Cadet M.D.   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  He is doing well with the SP tube and should be ready for discharge.    He is scheduled for cystoscopy with management of the stricture on 5/12 in [place].        LOS: 1 day    Irine Seal 03/25/[phone] ID: Elijah Santana, male   DOB: 10-30-1932, 85 y.o.   MRN: [id number]

## 2019-03-25 NOTE — Progress Notes (Signed)
Patient educated on maintenance of suprapubic catheter. Given x2 leg bags, drain bag, gauze/tape supplies for home. Patient returned demonstrated emptying and verbalized understanding of complications.

## 2019-03-25 NOTE — Progress Notes (Signed)
Subjective: Elijah Santana is doing well since SP tube placement.  He has had a good BM and has no complaints.    ROS:  Review of Systems  All other systems reviewed and are negative.   Anti-infectives: Anti-infectives (From admission, onward)   Start     Dose/Rate Route Frequency Ordered Stop   03/24/19 1045  ceFAZolin (ANCEF) 2-4 GM/100ML-% IVPB    Note to Pharmacy:  Lesia Hausen   : cabinet override      03/24/19 1045 03/24/19 1115   03/24/19 0345  cefTRIAXone (ROCEPHIN) 1 g in sodium chloride 0.9 % 100 mL IVPB     1 g 200 mL/hr over 30 Minutes Intravenous Every 24 hours 03/24/19 0340        Current Facility-Administered Medications  Medication Dose Route Frequency Provider Last Rate Last Dose  . 0.9 %  sodium chloride infusion  250 mL Intravenous PRN Jani Gravel, MD 10 mL/hr at 03/25/19 0401 10 mL/hr at 03/25/19 0401  . acetaminophen (TYLENOL) tablet 650 mg  650 mg Oral Q6H PRN Jani Gravel, MD   650 mg at 03/25/19 0356   Or  . acetaminophen (TYLENOL) suppository 650 mg  650 mg Rectal Q6H PRN Jani Gravel, MD      . amLODipine (NORVASC) tablet 5 mg  5 mg Oral Daily Jani Gravel, MD   5 mg at 03/24/19 0951  . bisacodyl (DULCOLAX) EC tablet 5-10 mg  5-10 mg Oral Daily PRN Jani Gravel, MD      . calcium carbonate (OS-CAL - dosed in mg of elemental calcium) tablet 500 mg of elemental calcium  1 tablet Oral Q breakfast Jani Gravel, MD   500 mg of elemental calcium at 03/25/19 0815  . cefTRIAXone (ROCEPHIN) 1 g in sodium chloride 0.9 % 100 mL IVPB  1 g Intravenous Q24H Jani Gravel, MD 200 mL/hr at 03/25/19 0402 1 g at 03/25/19 0402  . hydrALAZINE (APRESOLINE) injection 10 mg  10 mg Intravenous Q6H PRN Jani Gravel, MD   10 mg at 03/25/19 0459  . hydrALAZINE (APRESOLINE) tablet 25 mg  25 mg Oral TID Jani Gravel, MD   25 mg at 03/24/19 2200  . isosorbide mononitrate (IMDUR) 24 hr tablet 60 mg  60 mg Oral Daily Jani Gravel, MD   60 mg at 03/24/19 8546  . linaclotide (LINZESS) capsule 290 mcg  290 mcg Oral  QAC breakfast Jani Gravel, MD   290 mcg at 03/25/19 0815  . metoprolol tartrate (LOPRESSOR) tablet 50 mg  50 mg Oral BID Jani Gravel, MD   50 mg at 03/24/19 2200  . pantoprazole (PROTONIX) EC tablet 40 mg  40 mg Oral Daily Jani Gravel, MD   40 mg at 03/24/19 2703  . rosuvastatin (CRESTOR) tablet 20 mg  20 mg Oral q1800 Jani Gravel, MD   20 mg at 03/24/19 1625  . sodium chloride flush (NS) 0.9 % injection 3 mL  3 mL Intravenous Q12H Jani Gravel, MD   3 mL at 03/24/19 2236  . sodium chloride flush (NS) 0.9 % injection 3 mL  3 mL Intravenous PRN Jani Gravel, MD      . tamsulosin [facility]) capsule 0.4 mg  0.4 mg Oral BID Jani Gravel, MD   0.4 mg at 03/25/19 0815     Objective: Vital signs in last 24 hours: Temp:  [97.8 F (36.6 C)-99.1 F (37.3 C)] 98.4 F (36.9 C) (05/09 0421) Pulse Rate:  [60-73] 63 (05/09 0421) Resp:  [16-19] 16 (05/09 0421) BP: (123-179)/(58-88)  179/82 (05/09 0421) SpO2:  [92 %-98 %] 98 % (05/09 0841) Weight:  [97.7 kg] 97.7 kg (05/09 0500)  Intake/Output from previous day: 05/08 0701 - 05/09 0700 In: 53 [P.O.:480; IV Piggyback:100] Out: 2955 [Urine:925; Drains:2030] Intake/Output this shift: Total I/O In: -  Out: 400 [Drains:400]   Physical Exam Vitals signs reviewed.  Constitutional:      Appearance: Normal appearance.  Abdominal:     Comments: SP tube draining well with minimally pink urine.   Neurological:     Mental Status: He is alert.     Lab Results:  Recent Labs    03/23/19 2355 03/25/19 0716  WBC 6.2 6.6  HGB 11.6* 12.1*  HCT 36.7* 40.4  PLT 186 209   BMET Recent Labs    03/23/19 2355 03/25/19 0716  NA 142 141  K 3.9 3.3*  CL 112* 106  CO2 22 25  GLUCOSE 100* 104*  BUN 20 14  CREATININE 0.89 0.91  CALCIUM 8.4* 8.6*   PT/INR Recent Labs    03/24/19 0853  LABPROT 14.9  INR 1.2   ABG No results for input(s): PHART, HCO3 in the last 72 hours.  Invalid input(s): PCO2, PO2  Studies/Results: Ct Head Wo Contrast  Result  Date: 03/24/2019 CLINICAL DATA:  85 y/o  M; Vertigo, episodic, peripheral. EXAM: CT HEAD WITHOUT CONTRAST TECHNIQUE: Contiguous axial images were obtained from the base of the skull through the vertex without intravenous contrast. COMPARISON:  None. FINDINGS: Brain: No evidence of acute infarction, hemorrhage, hydrocephalus, extra-axial collection or mass lesion/mass effect. Nonspecific white matter hypodensities are compatible with chronic microvascular ischemic changes and there is volume loss brain. Partially empty sella turcica. Vascular: Calcific atherosclerosis of carotid siphons and the vertebral arteries. No hyperdense vessel identified. Skull: Normal. Negative for fracture or focal lesion. Sinuses/Orbits: No acute finding. Other: None. IMPRESSION: 1. No acute intracranial abnormality identified. 2. Chronic microvascular ischemic changes and volume loss of the brain. Electronically Signed   By: Kristine Garbe M.D.   On: 03/24/2019 01:21   Ct Abdomen Pelvis W Contrast  Result Date: 03/24/2019 CLINICAL DATA:  Abdominal distension EXAM: CT ABDOMEN AND PELVIS WITH CONTRAST TECHNIQUE: Multidetector CT imaging of the abdomen and pelvis was performed using the standard protocol following bolus administration of intravenous contrast. CONTRAST:  145mL OMNIPAQUE IOHEXOL 300 MG/ML  SOLN COMPARISON:  12/07/2017 PET CT FINDINGS: Lower chest: No acute abnormality Hepatobiliary: No focal hepatic abnormality. Gallbladder unremarkable. Pancreas: No focal abnormality or ductal dilatation. Spleen: No focal abnormality.  Normal size. Adrenals/Urinary Tract: Severe bilateral hydronephrosis, right greater than left. The urinary bladder is distended above the level of the umbilicus. Severely trabeculated bladder walls. Adrenal glands unremarkable. Stomach/Bowel: Sigmoid diverticulosis. No active diverticulitis. Stomach and small bowel decompressed, unremarkable. Vascular/Lymphatic: Aortic atherosclerosis. No enlarged  abdominal or pelvic lymph nodes. Reproductive: No visible focal abnormality. Other: No free fluid or free air. Musculoskeletal: Sclerotic focus within the right iliac bone measures 10 mm. Sclerotic focus in the anterior T12 vertebral body measures 10 mm. These have enlarged slightly since prior study. IMPRESSION: Severe bilateral hydronephrosis. Urinary bladder is distended and severely trabeculated. 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 Aortic atherosclerosis. Electronically Signed   By: Rolm Baptise M.D.   On: 03/24/2019 01:21   Ct Image Guided Drainage By Percutaneous Catheter  Result Date: 03/24/2019 INDICATION: 86 year old male with a history of bladder outlet obstruction in severe urinary retention. He presents  for urgent placement of a suprapubic catheter. EXAM: CT IMAGE GUIDED DRAINAGE BY PERCUTANEOUS CATHETER COMPARISON:  None. MEDICATIONS: 2 g Ancef; The antibiotic was administered in an appropriate time frame prior to skin puncture. ANESTHESIA/SEDATION: Fentanyl 2 mcg IV; Versed 100 mg IV Moderate Sedation Time:  14 minutes The patient was continuously monitored during the procedure by the interventional radiology nurse under my direct supervision. CONTRAST:  None FLUOROSCOPY TIME:  None COMPLICATIONS: None immediate. PROCEDURE: Informed written consent was obtained from the patient after a thorough discussion of the procedural risks, benefits and alternatives. All questions were addressed. Maximal Sterile Barrier Technique was utilized including caps, mask, sterile gowns, sterile gloves, sterile drape, hand hygiene and skin antiseptic. A timeout was performed prior to the initiation of the procedure. A planning axial CT scan was performed. The bladder is markedly distended. The ureters are also distended and filled with intravenous contrast. A suitable skin entry site was selected and marked. The overlying skin was  sterilely prepped and draped in the standard fashion with chlorhexidine skin prep. Local anesthesia was attained by infiltration with 1% lidocaine. A small dermatotomy was made. Under intermittent CT guidance, an 18 gauge introducer needle was advanced through the midline low anterior abdominal wall and into the bladder. A wire was then advanced into the bladder lumen. The tract was serially dilated to 71 [place] and a Cook 14 [place] all-purpose drainage catheter advanced into the bladder. The catheter was connected to gravity bag drainage and approximately 1600 mL of urine was drained. The suprapubic tube was secured to the skin with 0 Prolene suture and an adhesive fixation device. The patient tolerated the procedure well. There was no immediate complication. IMPRESSION: Technically successful placement of a 14 French suprapubic catheter. Electronically Signed   By: Jacqulynn Cadet M.D.   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  He is doing well with the SP tube and should be ready for discharge.    He is scheduled for cystoscopy with management of the stricture on 5/12 in [place].        LOS: 1 day    Irine Seal 03/25/2019 [phone] ID: Elijah Santana, male   DOB: 1932/06/21, 86 y.o.   MRN: [id number]

## 2019-03-26 ENCOUNTER — Other Ambulatory Visit: Payer: Self-pay

## 2019-03-26 ENCOUNTER — Emergency Department (HOSPITAL_COMMUNITY)
Admission: EM | Admit: 2019-03-26 | Discharge: 2019-03-26 | Disposition: A | Discharge status: Home / Self Care | Payer: Medicare HMO | Attending: Emergency Medicine | Admitting: Emergency Medicine

## 2019-03-26 ENCOUNTER — Encounter (HOSPITAL_COMMUNITY): Payer: Self-pay | Admitting: Emergency Medicine

## 2019-03-26 DIAGNOSIS — I2510 Atherosclerotic heart disease of native coronary artery without angina pectoris: Secondary | ICD-10-CM | POA: Insufficient documentation

## 2019-03-26 DIAGNOSIS — I10 Essential (primary) hypertension: Secondary | ICD-10-CM | POA: Insufficient documentation

## 2019-03-26 DIAGNOSIS — Z8546 Personal history of malignant neoplasm of prostate: Secondary | ICD-10-CM | POA: Diagnosis not present

## 2019-03-26 DIAGNOSIS — J45909 Unspecified asthma, uncomplicated: Secondary | ICD-10-CM | POA: Diagnosis not present

## 2019-03-26 DIAGNOSIS — Z87891 Personal history of nicotine dependence: Secondary | ICD-10-CM | POA: Diagnosis not present

## 2019-03-26 DIAGNOSIS — Z466 Encounter for fitting and adjustment of urinary device: Secondary | ICD-10-CM | POA: Diagnosis not present

## 2019-03-26 DIAGNOSIS — R319 Hematuria, unspecified: Secondary | ICD-10-CM | POA: Diagnosis not present

## 2019-03-26 DIAGNOSIS — Z435 Encounter for attention to cystostomy: Secondary | ICD-10-CM | POA: Diagnosis not present

## 2019-03-26 DIAGNOSIS — R110 Nausea: Secondary | ICD-10-CM | POA: Diagnosis present

## 2019-03-26 DIAGNOSIS — Z79899 Other long term (current) drug therapy: Secondary | ICD-10-CM | POA: Insufficient documentation

## 2019-03-26 LAB — URINALYSIS, ROUTINE W REFLEX MICROSCOPIC
Bacteria, UA: NONE SEEN
Bilirubin Urine: NEGATIVE
Glucose, UA: NEGATIVE mg/dL
Ketones, ur: NEGATIVE mg/dL
Nitrite: NEGATIVE
Protein, ur: 100 mg/dL — AB
RBC / HPF: >50 RBC/hpf — ABNORMAL HIGH (ref 0–5)
Specific Gravity, Urine: 1.019 (ref 1.005–1.030)
pH: 6 (ref 5.0–8.0)

## 2019-03-26 LAB — CBC WITH DIFFERENTIAL/PLATELET
Abs Immature Granulocytes: 0.02 10*3/uL (ref 0.00–0.07)
Basophils Absolute: 0 10*3/uL (ref 0.0–0.1)
Basophils Relative: 0 %
Eosinophils Absolute: 0.4 10*3/uL (ref 0.0–0.5)
Eosinophils Relative: 6 %
HCT: 33.6 % — ABNORMAL LOW (ref 39.0–52.0)
Hemoglobin: 11 g/dL — ABNORMAL LOW (ref 13.0–17.0)
Immature Granulocytes: 0 %
Lymphocytes Relative: 18 %
Lymphs Abs: 1.2 10*3/uL (ref 0.7–4.0)
MCH: 30.4 pg (ref 26.0–34.0)
MCHC: 32.7 g/dL (ref 30.0–36.0)
MCV: 92.8 fL (ref 80.0–100.0)
Monocytes Absolute: 0.6 10*3/uL (ref 0.1–1.0)
Monocytes Relative: 9 %
Neutro Abs: 4.2 10*3/uL (ref 1.7–7.7)
Neutrophils Relative %: 67 %
Platelets: 188 10*3/uL (ref 150–400)
RBC: 3.62 MIL/uL — ABNORMAL LOW (ref 4.22–5.81)
RDW: 14 % (ref 11.5–15.5)
WBC: 6.3 10*3/uL (ref 4.0–10.5)
nRBC: 0 % (ref 0.0–0.2)

## 2019-03-26 LAB — BASIC METABOLIC PANEL
Anion gap: 8 (ref 5–15)
BUN: 22 mg/dL (ref 8–23)
CO2: 21 mmol/L — ABNORMAL LOW (ref 22–32)
Calcium: 8.5 mg/dL — ABNORMAL LOW (ref 8.9–10.3)
Chloride: 111 mmol/L (ref 98–111)
Creatinine, Ser: 1.05 mg/dL (ref 0.61–1.24)
GFR calc Af Amer: >60 mL/min (ref >60)
GFR calc non Af Amer: >60 mL/min (ref >60)
Glucose, Bld: 114 mg/dL — ABNORMAL HIGH (ref 70–99)
Potassium: 3.4 mmol/L — ABNORMAL LOW (ref 3.5–5.1)
Sodium: 140 mmol/L (ref 135–145)

## 2019-03-26 MED ORDER — ACETAMINOPHEN 500 MG PO TABS
1000.0000 mg | ORAL_TABLET | Freq: Once | ORAL | Status: AC
  Administered 2019-03-26: 1000 mg via ORAL
  Filled 2019-03-26: qty 2

## 2019-03-26 MED ORDER — ONDANSETRON HCL 4 MG/2ML IJ SOLN
4.0000 mg | Freq: Once | INTRAMUSCULAR | Status: AC
  Administered 2019-03-26: 20:00:00 4 mg via INTRAVENOUS
  Filled 2019-03-26: qty 2

## 2019-03-26 MED ORDER — ONDANSETRON 4 MG PO TBDP: 4.0000 mg | ORAL_TABLET | Freq: Three times a day (TID) | ORAL | 0 refills | Status: DC | PRN

## 2019-03-26 NOTE — ED Notes (Signed)
Foley bed changed per pt's request, pt instructed on use of leg bag and expressed understanding,

## 2019-03-26 NOTE — Discharge Instructions (Addendum)
Continue to hold the eliquis and plavix.

## 2019-03-26 NOTE — ED Provider Notes (Signed)
[facility] EMERGENCY DEPARTMENT Provider Note   CSN: [id number] Arrival date & time: 03/26/19  1910    History   Chief Complaint Chief Complaint  Patient presents with  . Wound Check    HPI Elijah Santana is a 85 y.o. male.     Pt presents to the ED today with nausea.  Pt has a hx of prostate cancer and was admitted on 5/7 for inability to urinate.  A suprapubic catheter was placed by IR on 5/8.  He was d/c yesterday.  The pt has had some nausea and some back pain since the procedure.  He was taken off his Eliquis and plavix.  He is supposed to be off it until he sees Dr. Jeffie Pollock (urology) on 5/12.  The pt wanted to make sure everything was ok.  He said tylenol has helped his pain.  He last took it this morning.  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     Past Medical History:  Diagnosis Date  . Arthritis   . Asthma    Childhood  . Chronic back pain   . Coronary atherosclerosis of native coronary artery    a. CABG 2004 with LIMA-LAD, SVG-D1, SVG-OM, SVG-PDA. b. Cath ~2010 with occ of SVG-diagonal, distal LAD and OM filiing by collaterals. c. NSTEMI 12/2014 s/p DES to SVG-RCA. d. 11/2015: DES to distal graft of the SVG-distal RCA  . Essential hypertension   . Foley catheter in place   . GERD (gastroesophageal reflux disease)   . Hard of hearing   . Hyperglycemia   . Ischemic cardiomyopathy    a. EF 45% by cath 12/2014.  . Mixed hyperlipidemia   . NSTEMI (non-ST elevated myocardial infarction) ([place]) 01/01/15  . Paroxysmal atrial fibrillation (HCC)   . Pneumonia    hx of   . Prostate cancer [facility])    Radiation therapy 1996  . Skin cancer   . Stroke [facility])    TIA- 28 years ago     Patient Active Problem List   Diagnosis Date Noted  . Urinary retention 03/24/2019  . Bilateral hydronephrosis 03/24/2019  . Uncontrolled hypertension 03/24/2019  . Acute lower UTI 03/24/2019  . Anemia 03/24/2019  . Bone metastasis ([place]) 04/08/2018  . Prostate cancer ([place])  11/27/2017  . Atrial fibrillation, rapid -new 09/10/16 09/10/2016  . PAF- in setting of NSTEMI 09/10/2016  . Accelerating angina ([place])   . CAD S/P percutaneous coronary angioplasty   . Coronary artery disease with hx of myocardial infarct w/o hx of CABG 03/21/2015  . Ischemic cardiomyopathy   . Hyperglycemia   . Essential hypertension   . BPH (benign prostatic hyperplasia)   . NSTEMI- Troponin peak 6/47 12/31/2014  . S/P CABG x 4 2004 12/31/2014  . GERD (gastroesophageal reflux disease) 04/29/2012  . Mixed hyperlipidemia   . Essential hypertension, benign 08/06/2009    Past Surgical History:  Procedure Laterality Date  . CARDIAC CATHETERIZATION  01/01/2015   Procedure: CORONARY STENT INTERVENTION;  Surgeon: Peter M Martinique, MD;  Location: [facility] CATH LAB;  Service: Cardiovascular;;  SVG to PDA  . CARDIAC CATHETERIZATION N/A 11/21/2015   Procedure: Left Heart Cath and Cors/Grafts Angiography;  Surgeon: Troy Sine, MD;  Location: [place] CV LAB;  Service: Cardiovascular;  Laterality: N/A;  . CARDIAC CATHETERIZATION N/A 11/21/2015   Procedure: Coronary Stent Intervention;  Surgeon: Troy Sine, MD;  Location: [place] CV LAB;  Service: Cardiovascular;  Laterality: N/A;  . CARDIAC  CATHETERIZATION N/A 09/11/2016   Procedure: Left Heart Cath and Cors/Grafts Angiography;  Surgeon: Leonie Man, MD;  Location: [place] CV LAB;  Service: Cardiovascular;  Laterality: N/A;  . CARDIAC CATHETERIZATION N/A 09/11/2016   Procedure: Coronary Stent Intervention;  Surgeon: Leonie Man, MD;  Location: [place] CV LAB;  Service: Cardiovascular;  Laterality: N/A;  . CORONARY ARTERY BYPASS GRAFT  2004   LIMA to LAD, SVG to diagonal, SVG to circumflex, SVG to PDA  . GREEN LIGHT LASER TURP (TRANSURETHRAL RESECTION OF PROSTATE N/A 06/04/2016   Procedure: GREEN LIGHT LASER TURP (TRANSURETHRAL RESECTION OF PROSTATE;  Surgeon: Irine Seal, MD;  Location: [facility] ORS;  Service: Urology;  Laterality: N/A;   . LEFT HEART CATHETERIZATION WITH CORONARY/GRAFT ANGIOGRAM N/A 01/01/2015   Procedure: LEFT HEART CATHETERIZATION WITH Beatrix Fetters;  Surgeon: Peter M Martinique, MD;  Location: [facility] CATH LAB;  Service: Cardiovascular;  Laterality: N/A;  . PERCUTANEOUS CORONARY STENT INTERVENTION (PCI-S)  11/20/2015   distal SVG  with DES       . TONSILLECTOMY          Home Medications    Prior to Admission medications   Medication Sig Start Date End Date Taking? Authorizing Provider  acetaminophen (TYLENOL) 325 MG tablet Take 2 tablets (650 mg total) by mouth every 4 (four) hours as needed for headache or mild pain. 09/12/16  Yes Kilroy, Luke K, PA-C  amLODipine (NORVASC) 5 MG tablet TAKE ONE TABLET BY MOUTH DAILY (MORNING) Patient taking differently: Take 5 mg by mouth every morning.  11/22/17  Yes Satira Sark, MD  bisacodyl (DULCOLAX) 5 MG EC tablet Take 5-10 mg by mouth daily as needed (constipation). Reported on 05/28/2016   Yes [provider]  calcium carbonate (OS-CAL - DOSED IN MG OF ELEMENTAL CALCIUM) 1250 (500 Ca) MG tablet Take 1 tablet by mouth daily.    Yes [provider]  denosumab (XGEVA) 120 MG/1.7ML SOLN injection Inject 120 mg into the skin once.   Yes [provider]  isosorbide mononitrate (IMDUR) 60 MG 24 hr tablet TAKE ONE TABLET BY MOUTH DAILY (MORNING) Patient taking differently: Take 60 mg by mouth every morning.  03/06/19  Yes Satira Sark, MD  ketoconazole (NIZORAL) 2 % cream Apply 1 application topically 2 (two) times daily. Applied to the bottom of feet 12/29/18  Yes [provider]  Leuprolide Acetate (LUPRON IJ) Inject as directed every 4 (four) months.    Yes [provider]  linaclotide Rolan Lipa) 290 MCG CAPS capsule Take 1 capsule (290 mcg total) by mouth daily before breakfast. 02/23/19  Yes Setzer, Terri L, NP  magnesium citrate SOLN Take 1 Bottle by mouth once as needed for mild constipation or moderate  constipation.  02/23/19  Yes [provider]  metoprolol tartrate (LOPRESSOR) 50 MG tablet TAKE ONE TABLET BY MOUTH TWICE DAILY (MORNING ,EVENING) Patient taking differently: Take 50 mg by mouth 2 (two) times daily.  11/17/18  Yes Satira Sark, MD  pantoprazole (PROTONIX) 40 MG tablet Take 1 tablet (40 mg total) by mouth daily. 12/01/16  Yes Satira Sark, MD  rosuvastatin (CRESTOR) 20 MG tablet TAKE ONE TABLET BY MOUTH DAILY (EVENING) Patient taking differently: Take 20 mg by mouth every evening.  03/06/19  Yes Satira Sark, MD  STOOL SOFTENER 100 MG capsule Take 100-200 mg by mouth daily as needed for mild constipation.  01/02/19  Yes [provider]  XTANDI 40 MG capsule TAKE 4 CAPSULES (160 MG  TOTAL) BY MOUTH DAILY. Patient taking differently: Take 160 mg by mouth every morning.  02/27/19  Yes Derek Jack, MD  nitroGLYCERIN (NITROSTAT) 0.4 MG SL tablet PLACE ONE (1) TABLET UNDER TONGUE EVERY 5 MINUTES UP TO (3) DOSES AS NEEDED FOR CHEST PAIN. Patient not taking: Reported on 03/26/2019 03/16/17   Satira Sark, MD  ondansetron (ZOFRAN ODT) 4 MG disintegrating tablet Take 1 tablet (4 mg total) by mouth every 8 (eight) hours as needed. 03/26/19   Isla Pence, MD  traMADol (ULTRAM) 50 MG tablet Take 0.5 tablets (25 mg total) by mouth daily as needed. Patient not taking: Reported on 03/26/2019 12/01/18   Glennie Isle, NP-C    Family History Family History  Problem Relation Age of Onset  . Hypertension Father   . Coronary artery disease Father     Social History Social History   Tobacco Use  . Smoking status: Former Smoker    Packs/day: 0.50    Years: 27.00    Pack years: 13.50    Types: Cigars    Start date: 07/28/1964    Last attempt to quit: 07/28/1986    Years since quitting: 32.6  . Smokeless tobacco: Former Systems developer    Types: Chew    Quit date: 08/07/2010  . Tobacco comment: never chewed up over a pack/day  Substance Use Topics  .  Alcohol use: No    Alcohol/week: 0.0 standard drinks  . Drug use: No     Allergies   Patient has no known allergies.   Review of Systems Review of Systems  Gastrointestinal: Positive for nausea.  Musculoskeletal: Positive for back pain.  All other systems reviewed and are negative.    Physical Exam Updated Vital Signs BP (!) 156/85   Pulse 72   Temp 98.2 F (36.8 C) (Oral)   Resp 18   Ht 5\' 8"  (1.727 m)   Wt 97.7 kg   SpO2 96%   BMI 32.75 kg/m   Physical Exam Vitals signs and nursing note reviewed.  Constitutional:      Appearance: Normal appearance.  HENT:     Head: Normocephalic and atraumatic.     Right Ear: External ear normal.     Left Ear: External ear normal.     Nose: Nose normal.     Mouth/Throat:     Mouth: Mucous membranes are moist.     Pharynx: Oropharynx is clear.  Eyes:     Extraocular Movements: Extraocular movements intact.     Conjunctiva/sclera: Conjunctivae normal.     Pupils: Pupils are equal, round, and reactive to light.  Neck:     Musculoskeletal: Normal range of motion and neck supple.  Cardiovascular:     Rate and Rhythm: Normal rate and regular rhythm.     Pulses: Normal pulses.     Heart sounds: Normal heart sounds.  Pulmonary:     Effort: Pulmonary effort is normal.     Breath sounds: Normal breath sounds.  Abdominal:     General: Abdomen is flat. Bowel sounds are normal.     Palpations: Abdomen is soft.     Comments: Suprapubic site looks good.  No redness or swelling.  Musculoskeletal: Normal range of motion.  Skin:    General: Skin is warm.     Capillary Refill: Capillary refill takes less than 2 seconds.  Neurological:     General: No focal deficit present.     Mental Status: He is alert and oriented to person, place, and time.  Psychiatric:        Mood and Affect: Mood normal.        Behavior: Behavior normal.      ED Treatments / Results  Labs (all labs ordered are listed, but only abnormal results are  displayed) Labs Reviewed  BASIC METABOLIC PANEL - Abnormal; Notable for the following components:      Result Value   Potassium 3.4 (*)    CO2 21 (*)    Glucose, Bld 114 (*)    Calcium 8.5 (*)    All other components within normal limits  CBC WITH DIFFERENTIAL/PLATELET - Abnormal; Notable for the following components:   RBC 3.62 (*)    Hemoglobin 11.0 (*)    HCT 33.6 (*)    All other components within normal limits  URINALYSIS, ROUTINE W REFLEX MICROSCOPIC - Abnormal; Notable for the following components:   APPearance CLOUDY (*)    Hgb urine dipstick LARGE (*)    Protein, ur 100 (*)    Leukocytes,Ua TRACE (*)    RBC / HPF >50 (*)    All other components within normal limits  URINE CULTURE    EKG None  Radiology No results found.  Procedures Procedures (including critical care time)  Medications Ordered in ED Medications  ondansetron (ZOFRAN) injection 4 mg (4 mg Intravenous Given 03/26/19 1940)  acetaminophen (TYLENOL) tablet 1,000 mg (1,000 mg Oral Given 03/26/19 1930)     Initial Impression / Assessment and Plan / ED Course  I have reviewed the triage vital signs and the nursing notes.  Pertinent labs & imaging results that were available during my care of the patient were reviewed by me and considered in my medical decision making (see chart for details).       Pt is feeling better.  His urine is ok, other than a little bloody.  hgb is down a little from 5/9.  No infection, but ua sent for cx.  He has been holding plavix and eliquis and is told to continue that.  Pt has an appt on the 12th with urology.  He is instructed to return if worse.  Final Clinical Impressions(s) / ED Diagnoses   Final diagnoses:  Hematuria, unspecified type  Encounter for suprapubic catheter care [facility])    ED Discharge Orders         Ordered    ondansetron (ZOFRAN ODT) 4 MG disintegrating tablet  Every 8 hours PRN     03/26/19 2047           Isla Pence, MD 03/26/19 2050

## 2019-03-26 NOTE — ED Triage Notes (Signed)
Had urology procedure yesterday   Bleeding since the operation   Here due to nausea, blood  And desires to be rechecked

## 2019-03-27 ENCOUNTER — Other Ambulatory Visit: Payer: Self-pay | Admitting: Cardiology

## 2019-03-27 MED FILL — Fentanyl Citrate Preservative Free (PF) Inj 100 MCG/2ML: INTRAMUSCULAR | Qty: 2 | Status: AC

## 2019-03-28 ENCOUNTER — Other Ambulatory Visit: Payer: Self-pay

## 2019-03-28 ENCOUNTER — Encounter (HOSPITAL_COMMUNITY)
Admission: RE | Disposition: A | Discharge status: Home / Self Care | Payer: Self-pay | Source: Home / Self Care | Attending: Urology

## 2019-03-28 ENCOUNTER — Encounter (HOSPITAL_COMMUNITY): Payer: Self-pay

## 2019-03-28 ENCOUNTER — Ambulatory Visit (HOSPITAL_COMMUNITY): Payer: Medicare HMO | Admitting: Anesthesiology

## 2019-03-28 ENCOUNTER — Ambulatory Visit (HOSPITAL_COMMUNITY): Payer: Medicare HMO

## 2019-03-28 ENCOUNTER — Ambulatory Visit (HOSPITAL_COMMUNITY)
Admission: RE | Admit: 2019-03-28 | Discharge: 2019-03-28 | Disposition: A | Discharge status: Home / Self Care | Payer: Medicare HMO | Attending: Urology | Admitting: Urology

## 2019-03-28 DIAGNOSIS — Y842 Radiological procedure and radiotherapy as the cause of abnormal reaction of the patient, or of later complication, without mention of misadventure at the time of the procedure: Secondary | ICD-10-CM | POA: Diagnosis not present

## 2019-03-28 DIAGNOSIS — I2510 Atherosclerotic heart disease of native coronary artery without angina pectoris: Secondary | ICD-10-CM | POA: Diagnosis not present

## 2019-03-28 DIAGNOSIS — I10 Essential (primary) hypertension: Secondary | ICD-10-CM | POA: Insufficient documentation

## 2019-03-28 DIAGNOSIS — N3289 Other specified disorders of bladder: Secondary | ICD-10-CM | POA: Insufficient documentation

## 2019-03-28 DIAGNOSIS — I25119 Atherosclerotic heart disease of native coronary artery with unspecified angina pectoris: Secondary | ICD-10-CM | POA: Diagnosis not present

## 2019-03-28 DIAGNOSIS — D649 Anemia, unspecified: Secondary | ICD-10-CM | POA: Insufficient documentation

## 2019-03-28 DIAGNOSIS — J45909 Unspecified asthma, uncomplicated: Secondary | ICD-10-CM | POA: Diagnosis not present

## 2019-03-28 DIAGNOSIS — N99114 Postprocedural urethral stricture, male, unspecified: Secondary | ICD-10-CM | POA: Insufficient documentation

## 2019-03-28 DIAGNOSIS — K219 Gastro-esophageal reflux disease without esophagitis: Secondary | ICD-10-CM | POA: Insufficient documentation

## 2019-03-28 DIAGNOSIS — N4289 Other specified disorders of prostate: Secondary | ICD-10-CM

## 2019-03-28 DIAGNOSIS — Y848 Other medical procedures as the cause of abnormal reaction of the patient, or of later complication, without mention of misadventure at the time of the procedure: Secondary | ICD-10-CM | POA: Insufficient documentation

## 2019-03-28 DIAGNOSIS — C7951 Secondary malignant neoplasm of bone: Secondary | ICD-10-CM | POA: Insufficient documentation

## 2019-03-28 DIAGNOSIS — C61 Malignant neoplasm of prostate: Secondary | ICD-10-CM | POA: Insufficient documentation

## 2019-03-28 DIAGNOSIS — K5730 Diverticulosis of large intestine without perforation or abscess without bleeding: Secondary | ICD-10-CM | POA: Diagnosis not present

## 2019-03-28 DIAGNOSIS — Z79899 Other long term (current) drug therapy: Secondary | ICD-10-CM | POA: Diagnosis not present

## 2019-03-28 DIAGNOSIS — Z87891 Personal history of nicotine dependence: Secondary | ICD-10-CM | POA: Diagnosis not present

## 2019-03-28 DIAGNOSIS — I252 Old myocardial infarction: Secondary | ICD-10-CM | POA: Insufficient documentation

## 2019-03-28 DIAGNOSIS — Z923 Personal history of irradiation: Secondary | ICD-10-CM | POA: Diagnosis not present

## 2019-03-28 DIAGNOSIS — N429 Disorder of prostate, unspecified: Secondary | ICD-10-CM | POA: Diagnosis not present

## 2019-03-28 DIAGNOSIS — N35911 Unspecified urethral stricture, male, meatal: Secondary | ICD-10-CM | POA: Diagnosis not present

## 2019-03-28 DIAGNOSIS — N320 Bladder-neck obstruction: Secondary | ICD-10-CM | POA: Diagnosis present

## 2019-03-28 HISTORY — PX: CYSTOSCOPY WITH URETHRAL DILATATION: SHX5125

## 2019-03-28 LAB — URINE CULTURE: Culture: NO GROWTH

## 2019-03-28 SURGERY — CYSTOSCOPY, WITH URETHRAL DILATION
Anesthesia: General | Site: Urethra

## 2019-03-28 MED ORDER — MEPERIDINE HCL 50 MG/ML IJ SOLN: 6.2500 mg | INTRAMUSCULAR | Status: DC | PRN

## 2019-03-28 MED ORDER — SODIUM CHLORIDE 0.9% FLUSH: 3.0000 mL | Freq: Two times a day (BID) | INTRAVENOUS | Status: DC

## 2019-03-28 MED ORDER — LIDOCAINE 2% (20 MG/ML) 5 ML SYRINGE
INTRAMUSCULAR | Status: DC | PRN
  Administered 2019-03-28: 40 mg via INTRAVENOUS

## 2019-03-28 MED ORDER — GLYCOPYRROLATE PF 0.2 MG/ML IJ SOSY
PREFILLED_SYRINGE | INTRAMUSCULAR | Status: DC | PRN
  Administered 2019-03-28: .2 mg via INTRAVENOUS

## 2019-03-28 MED ORDER — ACETAMINOPHEN 10 MG/ML IV SOLN
INTRAVENOUS | Status: AC
  Filled 2019-03-28: qty 100

## 2019-03-28 MED ORDER — DIATRIZOATE MEGLUMINE 30 % UR SOLN
URETHRAL | Status: AC
  Filled 2019-03-28: qty 100

## 2019-03-28 MED ORDER — CEFAZOLIN SODIUM-DEXTROSE 2-4 GM/100ML-% IV SOLN
2.0000 g | INTRAVENOUS | Status: AC
  Administered 2019-03-28: 2 g via INTRAVENOUS

## 2019-03-28 MED ORDER — SUCCINYLCHOLINE CHLORIDE 200 MG/10ML IV SOSY
PREFILLED_SYRINGE | INTRAVENOUS | Status: AC
  Filled 2019-03-28: qty 10

## 2019-03-28 MED ORDER — HYDROMORPHONE HCL 1 MG/ML IJ SOLN
INTRAMUSCULAR | Status: AC
  Filled 2019-03-28: qty 0.5

## 2019-03-28 MED ORDER — ONDANSETRON HCL 4 MG/2ML IJ SOLN: 4.0000 mg | Freq: Once | INTRAMUSCULAR | Status: DC | PRN

## 2019-03-28 MED ORDER — CEPHALEXIN 500 MG PO CAPS: 500.0000 mg | ORAL_CAPSULE | Freq: Three times a day (TID) | ORAL | 0 refills | Status: AC

## 2019-03-28 MED ORDER — GLYCOPYRROLATE PF 0.2 MG/ML IJ SOSY
PREFILLED_SYRINGE | INTRAMUSCULAR | Status: AC
  Filled 2019-03-28: qty 1

## 2019-03-28 MED ORDER — LACTATED RINGERS IV SOLN
INTRAVENOUS | Status: DC
  Administered 2019-03-28: 10:00:00 via INTRAVENOUS

## 2019-03-28 MED ORDER — ACETAMINOPHEN 10 MG/ML IV SOLN
1000.0000 mg | Freq: Once | INTRAVENOUS | Status: AC
  Administered 2019-03-28: 1000 mg via INTRAVENOUS

## 2019-03-28 MED ORDER — HYDROMORPHONE HCL 1 MG/ML IJ SOLN
0.2500 mg | INTRAMUSCULAR | Status: DC | PRN
  Administered 2019-03-28 (×2): 0.5 mg via INTRAVENOUS

## 2019-03-28 MED ORDER — STERILE WATER FOR IRRIGATION IR SOLN
Status: DC | PRN
  Administered 2019-03-28: 3000 mL

## 2019-03-28 MED ORDER — SODIUM CHLORIDE 0.9 % IV SOLN
INTRAVENOUS | Status: DC | PRN
  Administered 2019-03-28: 16 mL

## 2019-03-28 MED ORDER — CEFAZOLIN SODIUM-DEXTROSE 2-4 GM/100ML-% IV SOLN
INTRAVENOUS | Status: AC
  Filled 2019-03-28: qty 100

## 2019-03-28 MED ORDER — LIDOCAINE 2% (20 MG/ML) 5 ML SYRINGE
INTRAMUSCULAR | Status: AC
  Filled 2019-03-28: qty 5

## 2019-03-28 MED ORDER — WATER FOR IRRIGATION, STERILE IR SOLN
Status: DC | PRN
  Administered 2019-03-28: 1000 mL

## 2019-03-28 MED ORDER — HYDROCODONE-ACETAMINOPHEN 7.5-325 MG PO TABS: 1.0000 | ORAL_TABLET | Freq: Once | ORAL | Status: DC | PRN

## 2019-03-28 MED ORDER — PROPOFOL 10 MG/ML IV BOLUS
INTRAVENOUS | Status: DC | PRN
  Administered 2019-03-28: 100 mg via INTRAVENOUS

## 2019-03-28 MED ORDER — FENTANYL CITRATE (PF) 100 MCG/2ML IJ SOLN
INTRAMUSCULAR | Status: DC | PRN
  Administered 2019-03-28 (×3): 25 ug via INTRAVENOUS

## 2019-03-28 MED ORDER — FENTANYL CITRATE (PF) 100 MCG/2ML IJ SOLN
INTRAMUSCULAR | Status: AC
  Filled 2019-03-28: qty 2

## 2019-03-28 MED ORDER — HYOSCYAMINE SULFATE 0.125 MG SL SUBL: 0.1250 mg | SUBLINGUAL_TABLET | Freq: Four times a day (QID) | SUBLINGUAL | 2 refills | Status: DC | PRN

## 2019-03-28 SURGICAL SUPPLY — 31 items
BAG DECANTER FOR FLEXI CONT (MISCELLANEOUS) ×4 IMPLANT
BAG DRAIN URO TABLE W/ADPT NS (BAG) ×4 IMPLANT
BAG DRN 8 ADPR NS SKTRN CSTL (BAG) ×2
BAG URINE DRAINAGE (UROLOGICAL SUPPLIES) ×4 IMPLANT
BALLN NEPHROSTOMY (BALLOONS) ×4
BALLOON NEPHROSTOMY (BALLOONS) ×2 IMPLANT
CATH FOLEY 2W COUNCIL 5CC 18FR (CATHETERS) ×3 IMPLANT
CATH FOLEY 2WAY SLVR  5CC 16FR (CATHETERS)
CATH FOLEY 2WAY SLVR 5CC 16FR (CATHETERS) IMPLANT
CLOTH BEACON ORANGE TIMEOUT ST (SAFETY) ×8 IMPLANT
ELECT REM PT RETURN 9FT ADLT (ELECTROSURGICAL) ×4
ELECTRODE REM PT RTRN 9FT ADLT (ELECTROSURGICAL) ×2 IMPLANT
GLOVE BIOGEL PI IND STRL 7.0 (GLOVE) ×4 IMPLANT
GLOVE BIOGEL PI INDICATOR 7.0 (GLOVE) ×4
GLOVE SURG SS PI 8.0 STRL IVOR (GLOVE) ×4 IMPLANT
GOWN STRL REUS W/TWL LRG LVL3 (GOWN DISPOSABLE) ×5 IMPLANT
GOWN STRL REUS W/TWL XL LVL3 (GOWN DISPOSABLE) ×4 IMPLANT
GUIDEWIRE STR DUAL SENSOR (WIRE) ×4 IMPLANT
IV NS IRRIG 3000ML ARTHROMATIC (IV SOLUTION) ×8 IMPLANT
KIT TURNOVER CYSTO (KITS) ×4 IMPLANT
LOOP CUT BIPOLAR 24F LRG (ELECTROSURGICAL) ×4 IMPLANT
MANIFOLD NEPTUNE II (INSTRUMENTS) ×4 IMPLANT
NS IRRIG 500ML POUR BTL (IV SOLUTION) IMPLANT
PACK CYSTO (CUSTOM PROCEDURE TRAY) ×4 IMPLANT
PAD ARMBOARD 7.5X6 YLW CONV (MISCELLANEOUS) ×4 IMPLANT
SYR 30ML LL (SYRINGE) ×4 IMPLANT
SYR CONTROL 10ML LL (SYRINGE) ×4 IMPLANT
SYRINGE IRR TOOMEY STRL 70CC (SYRINGE) ×4 IMPLANT
TOWEL OR 17X26 4PK STRL BLUE (TOWEL DISPOSABLE) ×8 IMPLANT
WATER STERILE IRR 3000ML UROMA (IV SOLUTION) ×4 IMPLANT
WATER STERILE IRR 500ML POUR (IV SOLUTION) ×4 IMPLANT

## 2019-03-28 NOTE — Transfer of Care (Signed)
Immediate Anesthesia Transfer of Care Note  Patient: Elijah Santana  Procedure(s) Performed: CYSTOSCOPY WITH URETHRAL  DILATATION OF STRICTURE (N/A Urethra)  Patient Location: PACU  Anesthesia Type:General  Level of Consciousness: awake, oriented and patient cooperative  Airway & Oxygen Therapy: Patient Spontanous Breathing and Patient connected to face mask oxygen  Post-op Assessment: Report given to RN and Post -op Vital signs reviewed and stable  Post vital signs: Reviewed and stable  Last Vitals:  Vitals Value Taken Time  BP 176/89 03/28/2019 11:34 AM  Temp 36.6 C 03/28/2019 11:33 AM  Pulse 61 03/28/2019 11:41 AM  Resp 11 03/28/2019 11:41 AM  SpO2 99 % 03/28/2019 11:41 AM  Vitals shown include unvalidated device data.  Last Pain:  Vitals:   03/28/19 0929  TempSrc: Oral  PainSc: 4          Complications: No apparent anesthesia complications

## 2019-03-28 NOTE — Discharge Instructions (Signed)

## 2019-03-28 NOTE — Anesthesia Procedure Notes (Signed)
Procedure Name: LMA Insertion Date/Time: 03/28/2019 10:46 AM Performed by: Andree Elk, Libbey Duce A, CRNA Patient Re-evaluated:Patient Re-evaluated prior to induction Oxygen Delivery Method: Circle system utilized Preoxygenation: Pre-oxygenation with 100% oxygen Induction Type: IV induction LMA: LMA inserted LMA Size: 5.0 Number of attempts: 1 Placement Confirmation: positive ETCO2 and breath sounds checked- equal and bilateral Tube secured with: Tape Dental Injury: Teeth and Oropharynx as per pre-operative assessment

## 2019-03-28 NOTE — Anesthesia Preprocedure Evaluation (Signed)
Anesthesia Evaluation  Patient identified by MRN, date of birth, ID band Patient awake    Reviewed: Allergy & Precautions, H&P , NPO status , Patient's Chart, lab work & pertinent test results, reviewed documented beta blocker date and time   Airway Mallampati: I  TM Distance: >3 FB Neck ROM: full    Dental  (+) Edentulous Upper, Edentulous Lower   Pulmonary asthma , pneumonia, former smoker,    Pulmonary exam normal breath sounds clear to auscultation       Cardiovascular Exercise Tolerance: Good hypertension, + angina + CAD and + Past MI   Rhythm:regular Rate:Normal     Neuro/Psych CVA negative psych ROS   GI/Hepatic Neg liver ROS, GERD  ,  Endo/Other  negative endocrine ROS  Renal/GU Renal disease  negative genitourinary   Musculoskeletal   Abdominal   Peds  Hematology  (+) Blood dyscrasia, anemia ,   Anesthesia Other Findings   Reproductive/Obstetrics negative OB ROS                             Anesthesia Physical Anesthesia Plan  ASA: III  Anesthesia Plan: General   Post-op Pain Management:    Induction:   PONV Risk Score and Plan:   Airway Management Planned:   Additional Equipment:   Intra-op Plan:   Post-operative Plan:   Informed Consent: I have reviewed the patients History and Physical, chart, labs and discussed the procedure including the risks, benefits and alternatives for the proposed anesthesia with the patient or authorized representative who has indicated his/her understanding and acceptance.       Plan Discussed with: CRNA  Anesthesia Plan Comments:         Anesthesia Quick Evaluation

## 2019-03-28 NOTE — Op Note (Signed)
Procedure: 1.  Cystoscopy with balloon dilation of prostatic urethral stricture. 2.  Cystogram with interpretation.  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  Postop diagnosis: Same.  Surgeon: Dr. Irine Seal.  Anesthesia: General.  Drains: 5 French council Foley catheter and previously placed suprapubic tube.  Specimen: None.  EBL: Minimal.  Complications: None.  Indications: Mr. Summer is a an 85 year old white male with a history of castrate resistant metastatic prostate cancer that is previously been treated with radiation therapy and is now managed with Lupron and Xtandi.  He has had a history of an elevated residual and was found on recent office cystoscopy to have a prosthetic urethral stricture.  Subsequent to cystoscopy he developed urinary retention but with bilateral hydronephrosis and underwent urgent placement of a suprapubic tube by interventional radiology.  He returns now for cystoscopy with planned dilation versus incision of prostatic urethral stricture.  Procedure: He was given 2 g of Ancef.  He was taken operating room where general anesthetic was induced.  He was placed in lithotomy position and fitted with PAS hose.  His perineum and genitalia were prepped with Betadine solution he was draped in usual sterile fashion.  Cystoscopy was performed using the 23 [place] scope and 30 degree lens.  Examination revealed a normal urethra.  The external sphincter was intact and just at about the level of the verumontanum the prostatic urethra became tightly strictured.  A sensor guidewire was passed with some resistance but it appeared to go into the bladder based on the location of the suprapubic tube.  The 15 cm 24 French high-pressure balloon catheter was passed over the wire into the bladder and the wire was removed with return of some bloody urine.  Contrast was instilled which appeared to go into the bladder and then out through the suprapubic tube.  The  balloon was then filled to 20 atm of pressure under fluoroscopic guidance with excellent disruption of the stricture.  The balloon was deflated and removed.  I then attempted to pass the cystoscope but the bladder neck was solidly fixed and I was unable to sufficiently angulate the scope to get it over the bladder neck into the bladder.  There was concern that there was a subtrigonal false passage.  I then used a dual-lumen semirigid ureteroscope and was able to negotiate a wire into the bladder anterior to the false passage and then advanced the scope into the bladder.  Visualization was poor.  But the suprapubic tube was seen.  The ureteroscope was removed and the balloon dilation cath was reinserted over the wire into the bladder and reinflated once again with apparent disruption of the stricture.  I then removed the balloon and reinserted the cystoscope over the wire and while it was difficult because of the fixed bladder neck I was able to negotiate the scope into the bladder.  There was some blood clot in the bladder from the instrumentation and this was evacuated.  No bladder lesions were seen but the ureteral orifice ease were not identified.  The suprapubic tube was visualized.  The cystoscope was removed and an 40 [place] council catheter was passed over the wire into the bladder.  The balloon was filled with 10 mL of sterile fluid.  The wire was removed and contrast was instilled confirming intravesical placement of the catheter.  The catheter was placed to straight drainage.  He was taken down from lithotomy position, his anesthetic was reversed and he was moved recovery in stable condition.  There  were no significant complications during the procedure.

## 2019-03-28 NOTE — Interval H&P Note (Signed)
History and Physical Interval Note:  03/28/2019 10:17 AM  Elijah Santana  has presented today for surgery, with the diagnosis of PROSTATIC STRICTURE.  The various methods of treatment have been discussed with the patient and family. After consideration of risks, benefits and other options for treatment, the patient has consented to  Procedure(s): CYSTOSCOPY WITH URETHRAL  DILATATION OF STRICTURE (N/A) TRANSURETHRAL RESECTION OF THE PROSTATE (TURP) (N/A) as a surgical intervention.  The patient's history has been reviewed, patient examined, no change in status, stable for surgery.  I have reviewed the patient's chart and labs.  Questions were answered to the patient's satisfaction.     Irine Seal

## 2019-03-28 NOTE — Anesthesia Postprocedure Evaluation (Signed)
Anesthesia Post Note  Patient: Elijah Santana  Procedure(s) Performed: CYSTOSCOPY WITH URETHRAL  DILATATION OF STRICTURE (N/A Urethra)  Patient location during evaluation: PACU Anesthesia Type: General Level of consciousness: awake and alert and oriented Pain management: pain level controlled Vital Signs Assessment: post-procedure vital signs reviewed and stable Respiratory status: spontaneous breathing Cardiovascular status: stable Postop Assessment: no apparent nausea or vomiting Anesthetic complications: no     Last Vitals:  Vitals:   03/28/19 0929 03/28/19 1133  BP: (!) 173/73 (!) 176/89  Pulse: 60 69  Resp: 20 17  Temp: 36.8 C 36.6 C  SpO2: 93% 99%    Last Pain:  Vitals:   03/28/19 0929  TempSrc: Oral  PainSc: 4                  ADAMS, AMY A

## 2019-03-29 ENCOUNTER — Encounter (HOSPITAL_COMMUNITY): Payer: Self-pay | Admitting: Urology

## 2019-03-29 NOTE — Progress Notes (Signed)
Pt's daughter called no one had been out to see pt from [facility]. [facility] was selected and referral given to in house rep.

## 2019-03-30 DIAGNOSIS — C61 Malignant neoplasm of prostate: Secondary | ICD-10-CM | POA: Diagnosis not present

## 2019-03-30 DIAGNOSIS — R339 Retention of urine, unspecified: Secondary | ICD-10-CM | POA: Diagnosis not present

## 2019-03-30 DIAGNOSIS — Z438 Encounter for attention to other artificial openings: Secondary | ICD-10-CM | POA: Diagnosis not present

## 2019-03-30 DIAGNOSIS — N390 Urinary tract infection, site not specified: Secondary | ICD-10-CM | POA: Diagnosis not present

## 2019-03-30 DIAGNOSIS — D649 Anemia, unspecified: Secondary | ICD-10-CM | POA: Diagnosis not present

## 2019-03-30 DIAGNOSIS — N1330 Unspecified hydronephrosis: Secondary | ICD-10-CM | POA: Diagnosis not present

## 2019-03-30 DIAGNOSIS — I160 Hypertensive urgency: Secondary | ICD-10-CM | POA: Diagnosis not present

## 2019-03-31 ENCOUNTER — Telehealth (HOSPITAL_COMMUNITY): Payer: Self-pay | Admitting: *Deleted

## 2019-03-31 ENCOUNTER — Encounter (HOSPITAL_COMMUNITY): Payer: Self-pay

## 2019-04-03 DIAGNOSIS — M549 Dorsalgia, unspecified: Secondary | ICD-10-CM | POA: Diagnosis not present

## 2019-04-03 DIAGNOSIS — R339 Retention of urine, unspecified: Secondary | ICD-10-CM | POA: Diagnosis not present

## 2019-04-03 DIAGNOSIS — D649 Anemia, unspecified: Secondary | ICD-10-CM | POA: Diagnosis not present

## 2019-04-03 DIAGNOSIS — I10 Essential (primary) hypertension: Secondary | ICD-10-CM | POA: Diagnosis not present

## 2019-04-03 DIAGNOSIS — I4891 Unspecified atrial fibrillation: Secondary | ICD-10-CM | POA: Diagnosis not present

## 2019-04-03 DIAGNOSIS — I160 Hypertensive urgency: Secondary | ICD-10-CM | POA: Diagnosis not present

## 2019-04-03 DIAGNOSIS — N1330 Unspecified hydronephrosis: Secondary | ICD-10-CM | POA: Diagnosis not present

## 2019-04-03 DIAGNOSIS — Z299 Encounter for prophylactic measures, unspecified: Secondary | ICD-10-CM | POA: Diagnosis not present

## 2019-04-03 DIAGNOSIS — N390 Urinary tract infection, site not specified: Secondary | ICD-10-CM | POA: Diagnosis not present

## 2019-04-03 DIAGNOSIS — C61 Malignant neoplasm of prostate: Secondary | ICD-10-CM | POA: Diagnosis not present

## 2019-04-03 DIAGNOSIS — Z438 Encounter for attention to other artificial openings: Secondary | ICD-10-CM | POA: Diagnosis not present

## 2019-04-05 ENCOUNTER — Inpatient Hospital Stay (HOSPITAL_COMMUNITY): Payer: Medicare HMO

## 2019-04-05 ENCOUNTER — Inpatient Hospital Stay (HOSPITAL_BASED_OUTPATIENT_CLINIC_OR_DEPARTMENT_OTHER): Payer: Medicare HMO | Admitting: Hematology

## 2019-04-05 ENCOUNTER — Other Ambulatory Visit (HOSPITAL_COMMUNITY): Payer: Medicare HMO

## 2019-04-05 ENCOUNTER — Encounter (HOSPITAL_COMMUNITY): Payer: Self-pay | Admitting: Hematology

## 2019-04-05 ENCOUNTER — Other Ambulatory Visit: Payer: Self-pay

## 2019-04-05 ENCOUNTER — Ambulatory Visit (HOSPITAL_COMMUNITY): Payer: Medicare HMO | Admitting: Hematology

## 2019-04-05 ENCOUNTER — Inpatient Hospital Stay (HOSPITAL_COMMUNITY): Payer: Medicare HMO | Attending: Hematology

## 2019-04-05 VITALS — BP 158/83 | HR 62 | Temp 97.5°F | Resp 16 | Wt 201.4 lb

## 2019-04-05 DIAGNOSIS — C7951 Secondary malignant neoplasm of bone: Secondary | ICD-10-CM | POA: Diagnosis not present

## 2019-04-05 DIAGNOSIS — C61 Malignant neoplasm of prostate: Secondary | ICD-10-CM

## 2019-04-05 DIAGNOSIS — C771 Secondary and unspecified malignant neoplasm of intrathoracic lymph nodes: Secondary | ICD-10-CM | POA: Diagnosis not present

## 2019-04-05 LAB — CBC WITH DIFFERENTIAL/PLATELET
Abs Immature Granulocytes: 0.02 10*3/uL (ref 0.00–0.07)
Basophils Absolute: 0.1 10*3/uL (ref 0.0–0.1)
Basophils Relative: 1 %
Eosinophils Absolute: 0.5 10*3/uL (ref 0.0–0.5)
Eosinophils Relative: 8 %
HCT: 41.1 % (ref 39.0–52.0)
Hemoglobin: 12.8 g/dL — ABNORMAL LOW (ref 13.0–17.0)
Immature Granulocytes: 0 %
Lymphocytes Relative: 17 %
Lymphs Abs: 1.1 10*3/uL (ref 0.7–4.0)
MCH: 29.4 pg (ref 26.0–34.0)
MCHC: 31.1 g/dL (ref 30.0–36.0)
MCV: 94.5 fL (ref 80.0–100.0)
Monocytes Absolute: 0.4 10*3/uL (ref 0.1–1.0)
Monocytes Relative: 7 %
Neutro Abs: 4.1 10*3/uL (ref 1.7–7.7)
Neutrophils Relative %: 67 %
Platelets: 275 10*3/uL (ref 150–400)
RBC: 4.35 MIL/uL (ref 4.22–5.81)
RDW: 14.1 % (ref 11.5–15.5)
WBC: 6.2 10*3/uL (ref 4.0–10.5)
nRBC: 0 % (ref 0.0–0.2)

## 2019-04-05 LAB — COMPREHENSIVE METABOLIC PANEL
ALT: 13 U/L (ref 0–44)
AST: 13 U/L — ABNORMAL LOW (ref 15–41)
Albumin: 4 g/dL (ref 3.5–5.0)
Alkaline Phosphatase: 39 U/L (ref 38–126)
Anion gap: 6 (ref 5–15)
BUN: 16 mg/dL (ref 8–23)
CO2: 26 mmol/L (ref 22–32)
Calcium: 9 mg/dL (ref 8.9–10.3)
Chloride: 109 mmol/L (ref 98–111)
Creatinine, Ser: 0.81 mg/dL (ref 0.61–1.24)
GFR calc Af Amer: >60 mL/min (ref >60)
GFR calc non Af Amer: >60 mL/min (ref >60)
Glucose, Bld: 97 mg/dL (ref 70–99)
Potassium: 4.3 mmol/L (ref 3.5–5.1)
Sodium: 141 mmol/L (ref 135–145)
Total Bilirubin: 0.9 mg/dL (ref 0.3–1.2)
Total Protein: 7 g/dL (ref 6.5–8.1)

## 2019-04-05 LAB — PSA: Prostatic Specific Antigen: 0.17 ng/mL (ref 0.00–4.00)

## 2019-04-05 MED ORDER — DENOSUMAB 120 MG/1.7ML ~~LOC~~ SOLN
120.0000 mg | Freq: Once | SUBCUTANEOUS | Status: AC
  Administered 2019-04-05: 120 mg via SUBCUTANEOUS

## 2019-04-05 MED ORDER — DENOSUMAB 120 MG/1.7ML ~~LOC~~ SOLN
SUBCUTANEOUS | Status: AC
  Filled 2019-04-05: qty 1.7

## 2019-04-05 NOTE — Progress Notes (Signed)
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   INTERVAL HISTORY:  Elijah Santana 85 y.o. male returns for routine follow-up. He is here today alone. He states that he has experienced diarrhea since his last visit that lasted about 2 hours. He states that he has had 3 surgeries since his last visit. He continues to take his cancer medication daily with no missed doses or side effects. Denies any nausea, or vomiting. Denies any new pains. Had not noticed any recent bleeding such as epistaxis, hematuria or hematochezia. Denies recent chest pain on exertion, shortness of breath on minimal exertion, pre-syncopal episodes, or palpitations. Denies any numbness or tingling in hands or feet. Denies any recent fevers, infections, or recent hospitalizations. Patient reports appetite at 100% and energy level at 50%.    REVIEW OF SYSTEMS:  Review of Systems  Gastrointestinal: Positive for diarrhea.     PAST MEDICAL/SURGICAL HISTORY:  Past Medical History:  Diagnosis Date  . Arthritis   . Asthma    Childhood  . Chronic back pain   . Coronary atherosclerosis of native coronary artery    a. CABG 2004 with LIMA-LAD, SVG-D1, SVG-OM, SVG-PDA. b. Cath ~2010 with occ of SVG-diagonal, distal LAD and OM filiing by collaterals. c. NSTEMI 12/2014 s/p DES to SVG-RCA. d. 11/2015: DES to distal graft of the SVG-distal RCA  . Essential hypertension   . Foley catheter in place   . GERD (gastroesophageal reflux disease)   . Hard of hearing   . Hyperglycemia   .  Ischemic cardiomyopathy    a. EF 45% by cath 12/2014.  . Mixed hyperlipidemia   . NSTEMI (non-ST elevated myocardial infarction) ([place]) 01/01/15  . Paroxysmal atrial fibrillation ([facility])   . Pneumonia    hx of   . Prostate cancer [facility])    Radiation therapy 1996  . Skin cancer   . Stroke [facility])    TIA- 28 years ago    Past Surgical History:  Procedure Laterality Date  . CARDIAC CATHETERIZATION  01/01/2015   Procedure: CORONARY STENT INTERVENTION;  Surgeon: Peter M Martinique, MD;  Location: [facility] CATH LAB;  Service: Cardiovascular;;  SVG to PDA  . CARDIAC CATHETERIZATION N/A 11/21/2015   Procedure: Left Heart Cath and Cors/Grafts Angiography;  Surgeon: Troy Sine, MD;  Location: [place] CV LAB;  Service: Cardiovascular;  Laterality: N/A;  . CARDIAC CATHETERIZATION N/A 11/21/2015   Procedure: Coronary Stent Intervention;  Surgeon: Troy Sine, MD;  Location: [place] CV LAB;  Service: Cardiovascular;  Laterality: N/A;  . CARDIAC CATHETERIZATION N/A 09/11/2016   Procedure: Left Heart Cath and Cors/Grafts Angiography;  Surgeon: Leonie Man, MD;  Location: [place] CV LAB;  Service: Cardiovascular;  Laterality: N/A;  . CARDIAC CATHETERIZATION N/A 09/11/2016   Procedure: Coronary Stent Intervention;  Surgeon: Leonie Man, MD;  Location: [place] CV LAB;  Service: Cardiovascular;  Laterality: N/A;  . CORONARY ARTERY BYPASS GRAFT  2004   LIMA to LAD, SVG to diagonal, SVG to circumflex, SVG  to PDA  . CYSTOSCOPY WITH URETHRAL DILATATION N/A 03/28/2019   Procedure: CYSTOSCOPY WITH URETHRAL  DILATATION OF STRICTURE;  Surgeon: Irine Seal, MD;  Location: [facility] ORS;  Service: Urology;  Laterality: N/A;  . GREEN LIGHT LASER TURP (TRANSURETHRAL RESECTION OF PROSTATE N/A 06/04/2016   Procedure: GREEN LIGHT LASER TURP (TRANSURETHRAL RESECTION OF PROSTATE;  Surgeon: Irine Seal, MD;  Location: [facility] ORS;  Service: Urology;  Laterality: N/A;  . LEFT HEART CATHETERIZATION WITH CORONARY/GRAFT ANGIOGRAM N/A  01/01/2015   Procedure: LEFT HEART CATHETERIZATION WITH Beatrix Fetters;  Surgeon: Peter M Martinique, MD;  Location: [facility] CATH LAB;  Service: Cardiovascular;  Laterality: N/A;  . PERCUTANEOUS CORONARY STENT INTERVENTION (PCI-S)  11/20/2015   distal SVG  with DES       . TONSILLECTOMY       SOCIAL HISTORY:  Social History   Socioeconomic History  . Marital status: Married    Spouse name: Not on file  . Number of children: Not on file  . Years of education: Not on file  . Highest education level: Not on file  Occupational History  . Occupation: Retired    Comment: Office manager  Social Needs  . Financial resource strain: Not on file  . Food insecurity:    Worry: Not on file    Inability: Not on file  . Transportation needs:    Medical: Not on file    Non-medical: Not on file  Tobacco Use  . Smoking status: Former Smoker    Packs/day: 0.50    Years: 27.00    Pack years: 13.50    Types: Cigars    Start date: 07/28/1964    Last attempt to quit: 07/28/1986    Years since quitting: 32.7  . Smokeless tobacco: Former Systems developer    Types: Chew    Quit date: 08/07/2010  . Tobacco comment: never chewed up over a pack/day  Substance and Sexual Activity  . Alcohol use: No    Alcohol/week: 0.0 standard drinks  . Drug use: No  . Sexual activity: Not on file  Lifestyle  . Physical activity:    Days per week: Not on file    Minutes per session: Not on file  . Stress: Not on file  Relationships  . Social connections:    Talks on phone: Not on file    Gets together: Not on file    Attends religious service: Not on file    Active member of club or organization: Not on file    Attends meetings of clubs or organizations: Not on file    Relationship status: Not on file  . Intimate partner violence:    Fear of current or ex partner: Not on file    Emotionally abused: Not on file    Physically abused: Not on file    Forced sexual activity: Not on file  Other Topics Concern   . Not on file  Social History Narrative  . Not on file    FAMILY HISTORY:  Family History  Problem Relation Age of Onset  . Hypertension Father   . Coronary artery disease Father     CURRENT MEDICATIONS:  Outpatient Encounter Medications as of 04/05/2019  Medication Sig Note  . acetaminophen (TYLENOL) 325 MG tablet Take 2 tablets (650 mg total) by mouth every 4 (four) hours as needed for headache or mild pain.   Marland Kitchen amLODipine (NORVASC) 5 MG tablet TAKE ONE TABLET BY MOUTH DAILY (MORNING) (Patient taking differently: Take 5 mg by mouth  every morning. )   . bisacodyl (DULCOLAX) 5 MG EC tablet Take 5-10 mg by mouth daily as needed (constipation). Reported on 05/28/2016   . calcium carbonate (OS-CAL - DOSED IN MG OF ELEMENTAL CALCIUM) 1250 (500 Ca) MG tablet Take 1 tablet by mouth daily.    . clopidogrel (PLAVIX) 75 MG tablet    . denosumab (XGEVA) 120 MG/1.7ML SOLN injection Inject 120 mg into the skin once.   Marland Kitchen ELIQUIS 5 MG TABS tablet    . isosorbide mononitrate (IMDUR) 60 MG 24 hr tablet TAKE ONE TABLET BY MOUTH DAILY (MORNING) (Patient taking differently: Take 60 mg by mouth every morning. )   . ketoconazole (NIZORAL) 2 % cream Apply 1 application topically 2 (two) times daily. Applied to the bottom of feet   . Leuprolide Acetate (LUPRON IJ) Inject as directed every 4 (four) months.  03/26/2019: Administered by the office of Dr. Jeffie Pollock every 4 months   . linaclotide (LINZESS) 290 MCG CAPS capsule Take 1 capsule (290 mcg total) by mouth daily before breakfast.   . metoprolol tartrate (LOPRESSOR) 50 MG tablet TAKE ONE TABLET BY MOUTH TWICE DAILY (MORNING ,EVENING) (Patient taking differently: Take 50 mg by mouth 2 (two) times daily. )   . pantoprazole (PROTONIX) 40 MG tablet Take 1 tablet (40 mg total) by mouth daily.   . rosuvastatin (CRESTOR) 20 MG tablet TAKE ONE TABLET BY MOUTH DAILY (EVENING) (Patient taking differently: Take 20 mg by mouth every evening. )   . STOOL SOFTENER 100 MG  capsule Take 100-200 mg by mouth daily as needed for mild constipation.    . traMADol (ULTRAM) 50 MG tablet Take 0.5 tablets (25 mg total) by mouth daily as needed.   Gillermina Phy 40 MG capsule TAKE 4 CAPSULES (160 MG TOTAL) BY MOUTH DAILY. (Patient taking differently: Take 160 mg by mouth every morning. )   . hyoscyamine (LEVSIN SL) 0.125 MG SL tablet Place 1 tablet (0.125 mg total) under the tongue every 6 (six) hours as needed for cramping. (Patient not taking: Reported on 04/05/2019)   . magnesium citrate SOLN Take 1 Bottle by mouth once as needed for mild constipation or moderate constipation.    . nitroGLYCERIN (NITROSTAT) 0.4 MG SL tablet DISSOLVE ONE TABLET UNDER TONGUE EVERY 5 MINUTES UP TO 3 DOSES AS NEEDED FOR CHEST PAIN (Patient not taking: Reported on 04/05/2019)   . ondansetron (ZOFRAN ODT) 4 MG disintegrating tablet Take 1 tablet (4 mg total) by mouth every 8 (eight) hours as needed. (Patient not taking: Reported on 04/05/2019)    No facility-administered encounter medications on file as of 04/05/2019.     ALLERGIES:  No Known Allergies   PHYSICAL EXAM:  ECOG Performance status: 1  Vitals:   04/05/19 1000  BP: (!) 158/83  Pulse: 62  Resp: 16  Temp: (!) 97.5 F (36.4 C)  SpO2: 97%   Filed Weights   04/05/19 1000  Weight: 201 lb 6 oz (91.3 kg)    Physical Exam Vitals signs reviewed.  Constitutional:      Appearance: Normal appearance.  Cardiovascular:     Rate and Rhythm: Normal rate and regular rhythm.     Heart sounds: Normal heart sounds.  Pulmonary:     Effort: Pulmonary effort is normal.     Breath sounds: Normal breath sounds.  Abdominal:     General: There is no distension.     Palpations: Abdomen is soft. There is no mass.  Musculoskeletal:  General: No swelling.  Skin:    General: Skin is warm.  Neurological:     General: No focal deficit present.     Mental Status: He is alert and oriented to person, place, and time.  Psychiatric:        Mood  and Affect: Mood normal.        Behavior: Behavior normal.      LABORATORY DATA:  I have reviewed the labs as listed.  CBC    Component Value Date/Time   WBC 6.2 04/05/2019 0951   RBC 4.35 04/05/2019 0951   HGB 12.8 (L) 04/05/2019 0951   HCT 41.1 04/05/2019 0951   PLT 275 04/05/2019 0951   MCV 94.5 04/05/2019 0951   MCH 29.4 04/05/2019 0951   MCHC 31.1 04/05/2019 0951   RDW 14.1 04/05/2019 0951   LYMPHSABS 1.1 04/05/2019 0951   MONOABS 0.4 04/05/2019 0951   EOSABS 0.5 04/05/2019 0951   BASOSABS 0.1 04/05/2019 0951   CMP Latest Ref Rng & Units 04/05/2019 03/26/2019 03/25/2019  Glucose 70 - 99 mg/dL 97 114(H) 104(H)  BUN 8 - 23 mg/dL 16 22 14   Creatinine 0.61 - 1.24 mg/dL 0.81 1.05 0.91  Sodium 135 - 145 mmol/L 141 140 141  Potassium 3.5 - 5.1 mmol/L 4.3 3.4(L) 3.3(L)  Chloride 98 - 111 mmol/L 109 111 106  CO2 22 - 32 mmol/L 26 21(L) 25  Calcium 8.9 - 10.3 mg/dL 9.0 8.5(L) 8.6(L)  Total Protein 6.5 - 8.1 g/dL 7.0 - -  Total Bilirubin 0.3 - 1.2 mg/dL 0.9 - -  Alkaline Phos 38 - 126 U/L 39 - -  AST 15 - 41 U/L 13(L) - -  ALT 0 - 44 U/L 13 - -       DIAGNOSTIC IMAGING:  I have independently reviewed the scans and discussed with the patient.   I have reviewed Venita Lick LPN's note and agree with the documentation.  I personally performed a face-to-face visit, made revisions and my assessment and plan is as follows.    ASSESSMENT & PLAN:   Prostate cancer ([place]) 1.  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 -Enzalutamide was started on 02/08/2018 at 80 mg daily, increased to 4 tablets  daily in the first week of June. -He is tolerating enzalutamide very well. -He reportedly received Lupron injection in Dr. Jethro Poling office  last week. -He denies any severe tiredness or falls.  I have reviewed his other blood work which are stable.  PSA has come down to 0.17. - He will continue enzalutamide at this time.  I will see him back in 2 months for follow-up.  2.  Bone metastasis: - He had 1 metastatic site on fluciclovine PET CT scan on the right ilium. -Denosumab was started on 05/02/2018 which is being tolerated very well. -He will continue calcium twice daily.     Total time spent is 25 minutes with more than 50% of the time spent face-to-face discussing disease status, treatment plan and coordination of care.    Orders placed this encounter:  Orders Placed This Encounter  Procedures  . CBC with Differential/Platelet  . Comprehensive metabolic panel  . PSA  Derek Jack, MD [place] [phone]

## 2019-04-05 NOTE — Patient Instructions (Addendum)
[facility] at [facility] Discharge Instructions  You were seen today by Dr. Katragadda. He went over your recent lab results. He will see you back in 2 months for labs and follow up.   Thank you for choosing [facility] at [facility] to provide your oncology and hematology care.  To afford each patient quality time with our provider, please arrive at least 15 minutes before your scheduled appointment time.   If you have a lab appointment with the Cancer Center please come in thru the  Main Entrance and check in at the main information desk  You need to re-schedule your appointment should you arrive 10 or more minutes late.  We strive to give you quality time with our providers, and arriving late affects you and other patients whose appointments are after yours.  Also, if you no show three or more times for appointments you may be dismissed from the clinic at the providers discretion.     Again, thank you for choosing [facility].  Our hope is that these requests will decrease the amount of time that you wait before being seen by our physicians.       _____________________________________________________________  Should you have questions after your visit to [facility], please contact our office at [phone] between the hours of 8:00 a.m. and 4:30 p.m.  Voicemails left after 4:00 p.m. will not be returned until the following business day.  For prescription refill requests, have your pharmacy contact our office and allow 72 hours.    Cancer Center Support Programs:   > Cancer Support Group  2nd Tuesday of the month 1pm-2pm, Journey Room    

## 2019-04-05 NOTE — Assessment & Plan Note (Signed)
1.  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 -Enzalutamide was started on 02/08/2018 at 80 mg daily, increased to 4 tablets daily in the first week of June. -He is tolerating enzalutamide very well. -He reportedly received Lupron injection in Dr. Jethro Poling office  last week. -He denies any severe tiredness or falls.  I have reviewed his other blood work which are stable.  PSA has come down to 0.17. - He will continue enzalutamide at this time.  I will see him back in 2 months for follow-up.  2.  Bone metastasis: - He had 1 metastatic site on fluciclovine PET CT scan on the right ilium. -Denosumab was started on 05/02/2018 which is being tolerated very well. -He will continue calcium twice daily.

## 2019-04-05 NOTE — Progress Notes (Signed)
Vaughan Basta presents today for injection per MD orders. XGeva administered SQ in right Thigh. Administration without incident. Patient tolerated well.  Vital signs stable. No complaints at this time. Discharged from clinic ambulatory. F/U with [facility] as scheduled.

## 2019-04-06 DIAGNOSIS — N1330 Unspecified hydronephrosis: Secondary | ICD-10-CM | POA: Diagnosis not present

## 2019-04-06 DIAGNOSIS — Z438 Encounter for attention to other artificial openings: Secondary | ICD-10-CM | POA: Diagnosis not present

## 2019-04-06 DIAGNOSIS — N390 Urinary tract infection, site not specified: Secondary | ICD-10-CM | POA: Diagnosis not present

## 2019-04-06 DIAGNOSIS — I160 Hypertensive urgency: Secondary | ICD-10-CM | POA: Diagnosis not present

## 2019-04-06 DIAGNOSIS — D649 Anemia, unspecified: Secondary | ICD-10-CM | POA: Diagnosis not present

## 2019-04-06 DIAGNOSIS — C61 Malignant neoplasm of prostate: Secondary | ICD-10-CM | POA: Diagnosis not present

## 2019-04-06 DIAGNOSIS — R339 Retention of urine, unspecified: Secondary | ICD-10-CM | POA: Diagnosis not present

## 2019-04-07 ENCOUNTER — Ambulatory Visit (INDEPENDENT_AMBULATORY_CARE_PROVIDER_SITE_OTHER): Payer: Medicare HMO | Admitting: Urology

## 2019-04-07 DIAGNOSIS — R3914 Feeling of incomplete bladder emptying: Secondary | ICD-10-CM | POA: Diagnosis not present

## 2019-04-07 DIAGNOSIS — R110 Nausea: Secondary | ICD-10-CM | POA: Diagnosis not present

## 2019-04-07 DIAGNOSIS — C61 Malignant neoplasm of prostate: Secondary | ICD-10-CM

## 2019-04-07 DIAGNOSIS — N35012 Post-traumatic membranous urethral stricture: Secondary | ICD-10-CM | POA: Diagnosis not present

## 2019-04-07 DIAGNOSIS — Z192 Hormone resistant malignancy status: Secondary | ICD-10-CM | POA: Diagnosis not present

## 2019-04-11 DIAGNOSIS — C61 Malignant neoplasm of prostate: Secondary | ICD-10-CM | POA: Diagnosis not present

## 2019-04-11 DIAGNOSIS — I160 Hypertensive urgency: Secondary | ICD-10-CM | POA: Diagnosis not present

## 2019-04-11 DIAGNOSIS — D649 Anemia, unspecified: Secondary | ICD-10-CM | POA: Diagnosis not present

## 2019-04-11 DIAGNOSIS — N1330 Unspecified hydronephrosis: Secondary | ICD-10-CM | POA: Diagnosis not present

## 2019-04-11 DIAGNOSIS — N390 Urinary tract infection, site not specified: Secondary | ICD-10-CM | POA: Diagnosis not present

## 2019-04-11 DIAGNOSIS — R339 Retention of urine, unspecified: Secondary | ICD-10-CM | POA: Diagnosis not present

## 2019-04-11 DIAGNOSIS — Z438 Encounter for attention to other artificial openings: Secondary | ICD-10-CM | POA: Diagnosis not present

## 2019-04-13 DIAGNOSIS — N1330 Unspecified hydronephrosis: Secondary | ICD-10-CM | POA: Diagnosis not present

## 2019-04-13 DIAGNOSIS — M159 Polyosteoarthritis, unspecified: Secondary | ICD-10-CM | POA: Diagnosis not present

## 2019-04-13 DIAGNOSIS — Z438 Encounter for attention to other artificial openings: Secondary | ICD-10-CM | POA: Diagnosis not present

## 2019-04-13 DIAGNOSIS — D649 Anemia, unspecified: Secondary | ICD-10-CM | POA: Diagnosis not present

## 2019-04-13 DIAGNOSIS — I2510 Atherosclerotic heart disease of native coronary artery without angina pectoris: Secondary | ICD-10-CM | POA: Diagnosis not present

## 2019-04-13 DIAGNOSIS — R339 Retention of urine, unspecified: Secondary | ICD-10-CM | POA: Diagnosis not present

## 2019-04-13 DIAGNOSIS — N390 Urinary tract infection, site not specified: Secondary | ICD-10-CM | POA: Diagnosis not present

## 2019-04-13 DIAGNOSIS — C61 Malignant neoplasm of prostate: Secondary | ICD-10-CM | POA: Diagnosis not present

## 2019-04-13 DIAGNOSIS — I10 Essential (primary) hypertension: Secondary | ICD-10-CM | POA: Diagnosis not present

## 2019-04-13 DIAGNOSIS — I160 Hypertensive urgency: Secondary | ICD-10-CM | POA: Diagnosis not present

## 2019-04-14 ENCOUNTER — Ambulatory Visit (INDEPENDENT_AMBULATORY_CARE_PROVIDER_SITE_OTHER): Payer: Medicare HMO | Admitting: Urology

## 2019-04-14 DIAGNOSIS — Z192 Hormone resistant malignancy status: Secondary | ICD-10-CM

## 2019-04-14 DIAGNOSIS — R3914 Feeling of incomplete bladder emptying: Secondary | ICD-10-CM

## 2019-04-14 DIAGNOSIS — C61 Malignant neoplasm of prostate: Secondary | ICD-10-CM | POA: Diagnosis not present

## 2019-04-14 DIAGNOSIS — R110 Nausea: Secondary | ICD-10-CM

## 2019-04-14 DIAGNOSIS — N35012 Post-traumatic membranous urethral stricture: Secondary | ICD-10-CM

## 2019-04-17 MED FILL — XTANDI 40 MG CAPSULE: 40 | 30 days supply | Qty: 120 | Fill #1

## 2019-04-19 ENCOUNTER — Ambulatory Visit: Payer: Medicare HMO | Admitting: Cardiology

## 2019-04-19 DIAGNOSIS — Z299 Encounter for prophylactic measures, unspecified: Secondary | ICD-10-CM | POA: Diagnosis not present

## 2019-04-19 DIAGNOSIS — I10 Essential (primary) hypertension: Secondary | ICD-10-CM | POA: Diagnosis not present

## 2019-04-19 DIAGNOSIS — R1310 Dysphagia, unspecified: Secondary | ICD-10-CM | POA: Diagnosis not present

## 2019-04-19 DIAGNOSIS — C61 Malignant neoplasm of prostate: Secondary | ICD-10-CM | POA: Diagnosis not present

## 2019-04-19 DIAGNOSIS — Z6828 Body mass index (BMI) 28.0-28.9, adult: Secondary | ICD-10-CM | POA: Diagnosis not present

## 2019-04-20 DIAGNOSIS — D649 Anemia, unspecified: Secondary | ICD-10-CM | POA: Diagnosis not present

## 2019-04-20 DIAGNOSIS — C61 Malignant neoplasm of prostate: Secondary | ICD-10-CM | POA: Diagnosis not present

## 2019-04-20 DIAGNOSIS — N35012 Post-traumatic membranous urethral stricture: Secondary | ICD-10-CM | POA: Diagnosis not present

## 2019-04-20 DIAGNOSIS — Z438 Encounter for attention to other artificial openings: Secondary | ICD-10-CM | POA: Diagnosis not present

## 2019-04-20 DIAGNOSIS — N390 Urinary tract infection, site not specified: Secondary | ICD-10-CM | POA: Diagnosis not present

## 2019-04-20 DIAGNOSIS — I160 Hypertensive urgency: Secondary | ICD-10-CM | POA: Diagnosis not present

## 2019-04-20 DIAGNOSIS — R339 Retention of urine, unspecified: Secondary | ICD-10-CM | POA: Diagnosis not present

## 2019-04-20 DIAGNOSIS — N1330 Unspecified hydronephrosis: Secondary | ICD-10-CM | POA: Diagnosis not present

## 2019-04-20 DIAGNOSIS — R3912 Poor urinary stream: Secondary | ICD-10-CM | POA: Diagnosis not present

## 2019-04-21 ENCOUNTER — Telehealth: Payer: Self-pay | Admitting: Cardiology

## 2019-04-21 NOTE — Telephone Encounter (Signed)
Please give pt's daughter a call concerning pt's BP [phone]

## 2019-04-21 NOTE — Telephone Encounter (Signed)
Went to urologist yesterday,  BP 111/68 and was concerned it was too low.I assured daughter that unless he had symptoms, his BP was ok. She will also call GI for his constipation

## 2019-04-24 DIAGNOSIS — N390 Urinary tract infection, site not specified: Secondary | ICD-10-CM | POA: Diagnosis not present

## 2019-04-24 DIAGNOSIS — C61 Malignant neoplasm of prostate: Secondary | ICD-10-CM | POA: Diagnosis not present

## 2019-04-24 DIAGNOSIS — I160 Hypertensive urgency: Secondary | ICD-10-CM | POA: Diagnosis not present

## 2019-04-24 DIAGNOSIS — D649 Anemia, unspecified: Secondary | ICD-10-CM | POA: Diagnosis not present

## 2019-04-24 DIAGNOSIS — R339 Retention of urine, unspecified: Secondary | ICD-10-CM | POA: Diagnosis not present

## 2019-04-24 DIAGNOSIS — N1330 Unspecified hydronephrosis: Secondary | ICD-10-CM | POA: Diagnosis not present

## 2019-04-24 DIAGNOSIS — Z438 Encounter for attention to other artificial openings: Secondary | ICD-10-CM | POA: Diagnosis not present

## 2019-04-26 DIAGNOSIS — N1330 Unspecified hydronephrosis: Secondary | ICD-10-CM | POA: Diagnosis not present

## 2019-04-26 DIAGNOSIS — I160 Hypertensive urgency: Secondary | ICD-10-CM | POA: Diagnosis not present

## 2019-04-26 DIAGNOSIS — N390 Urinary tract infection, site not specified: Secondary | ICD-10-CM | POA: Diagnosis not present

## 2019-04-26 DIAGNOSIS — D649 Anemia, unspecified: Secondary | ICD-10-CM | POA: Diagnosis not present

## 2019-04-26 DIAGNOSIS — C61 Malignant neoplasm of prostate: Secondary | ICD-10-CM | POA: Diagnosis not present

## 2019-04-26 DIAGNOSIS — Z438 Encounter for attention to other artificial openings: Secondary | ICD-10-CM | POA: Diagnosis not present

## 2019-04-26 DIAGNOSIS — R339 Retention of urine, unspecified: Secondary | ICD-10-CM | POA: Diagnosis not present

## 2019-05-01 DIAGNOSIS — I10 Essential (primary) hypertension: Secondary | ICD-10-CM | POA: Diagnosis not present

## 2019-05-01 DIAGNOSIS — M159 Polyosteoarthritis, unspecified: Secondary | ICD-10-CM | POA: Diagnosis not present

## 2019-05-01 DIAGNOSIS — I2510 Atherosclerotic heart disease of native coronary artery without angina pectoris: Secondary | ICD-10-CM | POA: Diagnosis not present

## 2019-05-02 DIAGNOSIS — C61 Malignant neoplasm of prostate: Secondary | ICD-10-CM | POA: Diagnosis not present

## 2019-05-02 DIAGNOSIS — N390 Urinary tract infection, site not specified: Secondary | ICD-10-CM | POA: Diagnosis not present

## 2019-05-02 DIAGNOSIS — D649 Anemia, unspecified: Secondary | ICD-10-CM | POA: Diagnosis not present

## 2019-05-02 DIAGNOSIS — N1330 Unspecified hydronephrosis: Secondary | ICD-10-CM | POA: Diagnosis not present

## 2019-05-02 DIAGNOSIS — Z438 Encounter for attention to other artificial openings: Secondary | ICD-10-CM | POA: Diagnosis not present

## 2019-05-02 DIAGNOSIS — R339 Retention of urine, unspecified: Secondary | ICD-10-CM | POA: Diagnosis not present

## 2019-05-02 DIAGNOSIS — I160 Hypertensive urgency: Secondary | ICD-10-CM | POA: Diagnosis not present

## 2019-05-03 ENCOUNTER — Inpatient Hospital Stay (HOSPITAL_COMMUNITY): Payer: Medicare HMO | Attending: Hematology

## 2019-05-03 ENCOUNTER — Inpatient Hospital Stay (HOSPITAL_COMMUNITY): Payer: Medicare HMO

## 2019-05-03 ENCOUNTER — Encounter (HOSPITAL_COMMUNITY): Payer: Self-pay

## 2019-05-03 ENCOUNTER — Other Ambulatory Visit: Payer: Self-pay

## 2019-05-03 VITALS — BP 155/84 | HR 68 | Temp 97.7°F | Resp 18

## 2019-05-03 DIAGNOSIS — C771 Secondary and unspecified malignant neoplasm of intrathoracic lymph nodes: Secondary | ICD-10-CM | POA: Insufficient documentation

## 2019-05-03 DIAGNOSIS — C61 Malignant neoplasm of prostate: Secondary | ICD-10-CM

## 2019-05-03 DIAGNOSIS — C7951 Secondary malignant neoplasm of bone: Secondary | ICD-10-CM

## 2019-05-03 LAB — CBC WITH DIFFERENTIAL/PLATELET
Abs Immature Granulocytes: 0.03 10*3/uL (ref 0.00–0.07)
Basophils Absolute: 0 10*3/uL (ref 0.0–0.1)
Basophils Relative: 1 %
Eosinophils Absolute: 0.4 10*3/uL (ref 0.0–0.5)
Eosinophils Relative: 5 %
HCT: 44.8 % (ref 39.0–52.0)
Hemoglobin: 13.7 g/dL (ref 13.0–17.0)
Immature Granulocytes: 0 %
Lymphocytes Relative: 19 %
Lymphs Abs: 1.3 10*3/uL (ref 0.7–4.0)
MCH: 28.4 pg (ref 26.0–34.0)
MCHC: 30.6 g/dL (ref 30.0–36.0)
MCV: 92.9 fL (ref 80.0–100.0)
Monocytes Absolute: 0.5 10*3/uL (ref 0.1–1.0)
Monocytes Relative: 7 %
Neutro Abs: 4.7 10*3/uL (ref 1.7–7.7)
Neutrophils Relative %: 68 %
Platelets: 350 10*3/uL (ref 150–400)
RBC: 4.82 MIL/uL (ref 4.22–5.81)
RDW: 14 % (ref 11.5–15.5)
WBC: 6.9 10*3/uL (ref 4.0–10.5)
nRBC: 0 % (ref 0.0–0.2)

## 2019-05-03 LAB — COMPREHENSIVE METABOLIC PANEL
ALT: 13 U/L (ref 0–44)
AST: 13 U/L — ABNORMAL LOW (ref 15–41)
Albumin: 3.8 g/dL (ref 3.5–5.0)
Alkaline Phosphatase: 47 U/L (ref 38–126)
Anion gap: 12 (ref 5–15)
BUN: 27 mg/dL — ABNORMAL HIGH (ref 8–23)
CO2: 24 mmol/L (ref 22–32)
Calcium: 9.7 mg/dL (ref 8.9–10.3)
Chloride: 103 mmol/L (ref 98–111)
Creatinine, Ser: 0.96 mg/dL (ref 0.61–1.24)
GFR calc Af Amer: >60 mL/min (ref >60)
GFR calc non Af Amer: >60 mL/min (ref >60)
Glucose, Bld: 108 mg/dL — ABNORMAL HIGH (ref 70–99)
Potassium: 5 mmol/L (ref 3.5–5.1)
Sodium: 139 mmol/L (ref 135–145)
Total Bilirubin: 0.9 mg/dL (ref 0.3–1.2)
Total Protein: 7.3 g/dL (ref 6.5–8.1)

## 2019-05-03 LAB — PSA: Prostatic Specific Antigen: 0.25 ng/mL (ref 0.00–4.00)

## 2019-05-03 MED ORDER — DENOSUMAB 120 MG/1.7ML ~~LOC~~ SOLN
120.0000 mg | Freq: Once | SUBCUTANEOUS | Status: AC
  Administered 2019-05-03: 120 mg via SUBCUTANEOUS

## 2019-05-03 MED ORDER — DENOSUMAB 120 MG/1.7ML ~~LOC~~ SOLN
SUBCUTANEOUS | Status: AC
  Filled 2019-05-03: qty 1.7

## 2019-05-03 NOTE — Progress Notes (Signed)
Elijah Santana tolerated Xgeva injection well without complaints or incident. Calcium 9.7 today and pt denied any tooth or jaw pain and no recent or future dental visits planned prior to administering this medication. Pt continued to take his Calcium PO as prescribed. VSS Pt discharged self ambulatory in satisfactory ocndition

## 2019-05-03 NOTE — Patient Instructions (Signed)
[facility] at [facility] Discharge Instructions  Received Xgeva injection today. Follow-up as scheduled. Call clinic for any questions or concerns   Thank you for choosing [facility] at [facility] to provide your oncology and hematology care.  To afford each patient quality time with our provider, please arrive at least 15 minutes before your scheduled appointment time.   If you have a lab appointment with the Cancer Center please come in thru the  Main Entrance and check in at the main information desk  You need to re-schedule your appointment should you arrive 10 or more minutes late.  We strive to give you quality time with our providers, and arriving late affects you and other patients whose appointments are after yours.  Also, if you no show three or more times for appointments you may be dismissed from the clinic at the providers discretion.     Again, thank you for choosing [facility].  Our hope is that these requests will decrease the amount of time that you wait before being seen by our physicians.       _____________________________________________________________  Should you have questions after your visit to  Cancer Center, please contact our office at [phone] between the hours of 8:00 a.m. and 4:30 p.m.  Voicemails left after 4:00 p.m. will not be returned until the following business day.  For prescription refill requests, have your pharmacy contact our office and allow 72 hours.    Cancer Center Support Programs:   > Cancer Support Group  2nd Tuesday of the month 1pm-2pm, Journey Room   

## 2019-05-05 ENCOUNTER — Ambulatory Visit (INDEPENDENT_AMBULATORY_CARE_PROVIDER_SITE_OTHER): Payer: Medicare HMO | Admitting: Urology

## 2019-05-05 DIAGNOSIS — C61 Malignant neoplasm of prostate: Secondary | ICD-10-CM | POA: Diagnosis not present

## 2019-05-05 DIAGNOSIS — R338 Other retention of urine: Secondary | ICD-10-CM

## 2019-05-05 DIAGNOSIS — N35012 Post-traumatic membranous urethral stricture: Secondary | ICD-10-CM | POA: Diagnosis not present

## 2019-05-09 ENCOUNTER — Encounter (HOSPITAL_COMMUNITY): Payer: Self-pay

## 2019-05-09 ENCOUNTER — Other Ambulatory Visit: Payer: Self-pay

## 2019-05-09 DIAGNOSIS — J45909 Unspecified asthma, uncomplicated: Secondary | ICD-10-CM | POA: Insufficient documentation

## 2019-05-09 DIAGNOSIS — Z79899 Other long term (current) drug therapy: Secondary | ICD-10-CM | POA: Insufficient documentation

## 2019-05-09 DIAGNOSIS — Z438 Encounter for attention to other artificial openings: Secondary | ICD-10-CM | POA: Diagnosis not present

## 2019-05-09 DIAGNOSIS — R319 Hematuria, unspecified: Secondary | ICD-10-CM | POA: Diagnosis present

## 2019-05-09 DIAGNOSIS — M549 Dorsalgia, unspecified: Secondary | ICD-10-CM | POA: Diagnosis not present

## 2019-05-09 DIAGNOSIS — I2510 Atherosclerotic heart disease of native coronary artery without angina pectoris: Secondary | ICD-10-CM | POA: Insufficient documentation

## 2019-05-09 DIAGNOSIS — Z951 Presence of aortocoronary bypass graft: Secondary | ICD-10-CM | POA: Insufficient documentation

## 2019-05-09 DIAGNOSIS — Z8673 Personal history of transient ischemic attack (TIA), and cerebral infarction without residual deficits: Secondary | ICD-10-CM | POA: Diagnosis not present

## 2019-05-09 DIAGNOSIS — N390 Urinary tract infection, site not specified: Secondary | ICD-10-CM | POA: Diagnosis not present

## 2019-05-09 DIAGNOSIS — Z85828 Personal history of other malignant neoplasm of skin: Secondary | ICD-10-CM | POA: Diagnosis not present

## 2019-05-09 DIAGNOSIS — Z87891 Personal history of nicotine dependence: Secondary | ICD-10-CM | POA: Insufficient documentation

## 2019-05-09 DIAGNOSIS — I10 Essential (primary) hypertension: Secondary | ICD-10-CM | POA: Diagnosis not present

## 2019-05-09 DIAGNOSIS — Z8546 Personal history of malignant neoplasm of prostate: Secondary | ICD-10-CM | POA: Diagnosis not present

## 2019-05-09 DIAGNOSIS — C61 Malignant neoplasm of prostate: Secondary | ICD-10-CM | POA: Diagnosis not present

## 2019-05-09 DIAGNOSIS — I252 Old myocardial infarction: Secondary | ICD-10-CM | POA: Insufficient documentation

## 2019-05-09 DIAGNOSIS — Z7901 Long term (current) use of anticoagulants: Secondary | ICD-10-CM | POA: Diagnosis not present

## 2019-05-09 DIAGNOSIS — N1330 Unspecified hydronephrosis: Secondary | ICD-10-CM | POA: Diagnosis not present

## 2019-05-09 DIAGNOSIS — Z7902 Long term (current) use of antithrombotics/antiplatelets: Secondary | ICD-10-CM | POA: Diagnosis not present

## 2019-05-09 DIAGNOSIS — N3091 Cystitis, unspecified with hematuria: Secondary | ICD-10-CM | POA: Diagnosis not present

## 2019-05-09 DIAGNOSIS — I160 Hypertensive urgency: Secondary | ICD-10-CM | POA: Diagnosis not present

## 2019-05-09 DIAGNOSIS — D649 Anemia, unspecified: Secondary | ICD-10-CM | POA: Diagnosis not present

## 2019-05-09 DIAGNOSIS — R339 Retention of urine, unspecified: Secondary | ICD-10-CM | POA: Diagnosis not present

## 2019-05-09 NOTE — ED Triage Notes (Signed)
Pt arrived with complaints of blood in the urine, pt has a suprapubic catheter. Pt states he has been taking blood thinners.

## 2019-05-10 ENCOUNTER — Emergency Department (HOSPITAL_COMMUNITY)
Admission: EM | Admit: 2019-05-10 | Discharge: 2019-05-10 | Disposition: A | Discharge status: Home / Self Care | Payer: Medicare HMO | Attending: Emergency Medicine | Admitting: Emergency Medicine

## 2019-05-10 DIAGNOSIS — N3091 Cystitis, unspecified with hematuria: Secondary | ICD-10-CM

## 2019-05-10 LAB — URINALYSIS, ROUTINE W REFLEX MICROSCOPIC
Bilirubin Urine: NEGATIVE
Glucose, UA: NEGATIVE mg/dL
Ketones, ur: NEGATIVE mg/dL
Nitrite: POSITIVE — AB
Protein, ur: 100 mg/dL — AB
RBC / HPF: >50 RBC/hpf — ABNORMAL HIGH (ref 0–5)
Specific Gravity, Urine: 1.011 (ref 1.005–1.030)
WBC, UA: >50 WBC/hpf — ABNORMAL HIGH (ref 0–5)
pH: 6 (ref 5.0–8.0)

## 2019-05-10 LAB — BASIC METABOLIC PANEL
Anion gap: 8 (ref 5–15)
BUN: 12 mg/dL (ref 8–23)
CO2: 23 mmol/L (ref 22–32)
Calcium: 8.6 mg/dL — ABNORMAL LOW (ref 8.9–10.3)
Chloride: 109 mmol/L (ref 98–111)
Creatinine, Ser: 0.7 mg/dL (ref 0.61–1.24)
GFR calc Af Amer: >60 mL/min (ref >60)
GFR calc non Af Amer: >60 mL/min (ref >60)
Glucose, Bld: 108 mg/dL — ABNORMAL HIGH (ref 70–99)
Potassium: 4 mmol/L (ref 3.5–5.1)
Sodium: 140 mmol/L (ref 135–145)

## 2019-05-10 LAB — CBC
HCT: 38.8 % — ABNORMAL LOW (ref 39.0–52.0)
Hemoglobin: 12.2 g/dL — ABNORMAL LOW (ref 13.0–17.0)
MCH: 29.4 pg (ref 26.0–34.0)
MCHC: 31.4 g/dL (ref 30.0–36.0)
MCV: 93.5 fL (ref 80.0–100.0)
Platelets: 254 10*3/uL (ref 150–400)
RBC: 4.15 MIL/uL — ABNORMAL LOW (ref 4.22–5.81)
RDW: 14.5 % (ref 11.5–15.5)
WBC: 8.3 10*3/uL (ref 4.0–10.5)
nRBC: 0 % (ref 0.0–0.2)

## 2019-05-10 MED ORDER — CEFTRIAXONE SODIUM 1 G IJ SOLR
1.0000 g | Freq: Once | INTRAMUSCULAR | Status: AC
  Administered 2019-05-10: 1 g via INTRAMUSCULAR
  Filled 2019-05-10: qty 10

## 2019-05-10 MED ORDER — ACETAMINOPHEN 325 MG PO TABS
650.0000 mg | ORAL_TABLET | Freq: Once | ORAL | Status: AC
  Administered 2019-05-10: 650 mg via ORAL
  Filled 2019-05-10: qty 2

## 2019-05-10 MED ORDER — LIDOCAINE HCL 1 % IJ SOLN
INTRAMUSCULAR | Status: AC
  Administered 2019-05-10: 20 mL
  Filled 2019-05-10: qty 20

## 2019-05-10 MED ORDER — CEPHALEXIN 500 MG PO CAPS: 500.0000 mg | ORAL_CAPSULE | Freq: Two times a day (BID) | ORAL | 0 refills | Status: DC

## 2019-05-10 NOTE — ED Notes (Signed)
Pt requesting Tylenol for his back pain. MD aware

## 2019-05-10 NOTE — ED Notes (Signed)
Pt verbalized discharge instructions and follow up care. Alert and ambulatory. No IV. Home health nurse is taking pt back to house

## 2019-05-10 NOTE — ED Provider Notes (Signed)
[place] DEPT Provider Note   CSN: [id number] Arrival date & time: 05/09/19  2113    History   Chief Complaint Chief Complaint  Patient presents with  . Hematuria    HPI Elijah Santana is a 85 y.o. male.     Patient presents to the emergency department for evaluation of hematuria.  Patient has a chronic indwelling suprapubic catheter.  He noticed bright red blood in his catheter bag tonight.  He reports that he has emptied it twice and the blood is clearing but there is still some blood in it.  He is not experiencing any pain.  He has not any fever.  No nausea, vomiting or diarrhea.  He is complaining some pain in the area of his left posterior hip, but this is chronic.     Past Medical History:  Diagnosis Date  . Arthritis   . Asthma    Childhood  . Chronic back pain   . Coronary atherosclerosis of native coronary artery    a. CABG 2004 with LIMA-LAD, SVG-D1, SVG-OM, SVG-PDA. b. Cath ~2010 with occ of SVG-diagonal, distal LAD and OM filiing by collaterals. c. NSTEMI 12/2014 s/p DES to SVG-RCA. d. 11/2015: DES to distal graft of the SVG-distal RCA  . Essential hypertension   . Foley catheter in place   . GERD (gastroesophageal reflux disease)   . Hard of hearing   . Hyperglycemia   . Ischemic cardiomyopathy    a. EF 45% by cath 12/2014.  . Mixed hyperlipidemia   . NSTEMI (non-ST elevated myocardial infarction) ([place]) 01/01/15  . Paroxysmal atrial fibrillation (HCC)   . Pneumonia    hx of   . Prostate cancer [facility])    Radiation therapy 1996  . Skin cancer   . Stroke [facility])    TIA- 28 years ago     Patient Active Problem List   Diagnosis Date Noted  . Urinary retention 03/24/2019  . Bilateral hydronephrosis 03/24/2019  . Uncontrolled hypertension 03/24/2019  . Acute lower UTI 03/24/2019  . Anemia 03/24/2019  . Bone metastasis ([place]) 04/08/2018  . Prostate cancer ([place]) 11/27/2017  . Atrial fibrillation, rapid -new 09/10/16 09/10/2016   . PAF- in setting of NSTEMI 09/10/2016  . Accelerating angina ([place])   . CAD S/P percutaneous coronary angioplasty   . Coronary artery disease with hx of myocardial infarct w/o hx of CABG 03/21/2015  . Ischemic cardiomyopathy   . Hyperglycemia   . Essential hypertension   . BPH (benign prostatic hyperplasia)   . NSTEMI- Troponin peak 6/47 12/31/2014  . S/P CABG x 4 2004 12/31/2014  . GERD (gastroesophageal reflux disease) 04/29/2012  . Mixed hyperlipidemia   . Essential hypertension, benign 08/06/2009    Past Surgical History:  Procedure Laterality Date  . CARDIAC CATHETERIZATION  01/01/2015   Procedure: CORONARY STENT INTERVENTION;  Surgeon: Peter M Martinique, MD;  Location: [facility] CATH LAB;  Service: Cardiovascular;;  SVG to PDA  . CARDIAC CATHETERIZATION N/A 11/21/2015   Procedure: Left Heart Cath and Cors/Grafts Angiography;  Surgeon: Troy Sine, MD;  Location: [place] CV LAB;  Service: Cardiovascular;  Laterality: N/A;  . CARDIAC CATHETERIZATION N/A 11/21/2015   Procedure: Coronary Stent Intervention;  Surgeon: Troy Sine, MD;  Location: [place] CV LAB;  Service: Cardiovascular;  Laterality: N/A;  . CARDIAC CATHETERIZATION N/A 09/11/2016   Procedure: Left Heart Cath and Cors/Grafts Angiography;  Surgeon: Leonie Man, MD;  Location: [place] CV LAB;  Service: Cardiovascular;  Laterality:  N/A;  . CARDIAC CATHETERIZATION N/A 09/11/2016   Procedure: Coronary Stent Intervention;  Surgeon: Leonie Man, MD;  Location: [place] CV LAB;  Service: Cardiovascular;  Laterality: N/A;  . CORONARY ARTERY BYPASS GRAFT  2004   LIMA to LAD, SVG to diagonal, SVG to circumflex, SVG to PDA  . CYSTOSCOPY WITH URETHRAL DILATATION N/A 03/28/2019   Procedure: CYSTOSCOPY WITH URETHRAL  DILATATION OF STRICTURE;  Surgeon: Irine Seal, MD;  Location: [facility] ORS;  Service: Urology;  Laterality: N/A;  . GREEN LIGHT LASER TURP (TRANSURETHRAL RESECTION OF PROSTATE N/A 06/04/2016   Procedure: GREEN  LIGHT LASER TURP (TRANSURETHRAL RESECTION OF PROSTATE;  Surgeon: Irine Seal, MD;  Location: [facility] ORS;  Service: Urology;  Laterality: N/A;  . LEFT HEART CATHETERIZATION WITH CORONARY/GRAFT ANGIOGRAM N/A 01/01/2015   Procedure: LEFT HEART CATHETERIZATION WITH Beatrix Fetters;  Surgeon: Peter M Martinique, MD;  Location: [facility] CATH LAB;  Service: Cardiovascular;  Laterality: N/A;  . PERCUTANEOUS CORONARY STENT INTERVENTION (PCI-S)  11/20/2015   distal SVG  with DES       . TONSILLECTOMY          Home Medications    Prior to Admission medications   Medication Sig Start Date End Date Taking? Authorizing Provider  acetaminophen (TYLENOL) 325 MG tablet Take 2 tablets (650 mg total) by mouth every 4 (four) hours as needed for headache or mild pain. 09/12/16  Yes Kilroy, Luke K, PA-C  amLODipine (NORVASC) 5 MG tablet TAKE ONE TABLET BY MOUTH DAILY (MORNING) Patient taking differently: Take 5 mg by mouth every morning.  11/22/17  Yes Satira Sark, MD  ELIQUIS 5 MG TABS tablet Take 5 mg by mouth 2 (two) times daily.  04/03/19  Yes [provider]  bisacodyl (DULCOLAX) 5 MG EC tablet Take 5-10 mg by mouth daily as needed (constipation). Reported on 05/28/2016    [provider]  calcium carbonate (OS-CAL - DOSED IN MG OF ELEMENTAL CALCIUM) 1250 (500 Ca) MG tablet Take 1 tablet by mouth daily.     [provider]  cephALEXin (KEFLEX) 500 MG capsule Take 1 capsule (500 mg total) by mouth 2 (two) times daily. 05/10/19   Orpah Greek, MD  clopidogrel (PLAVIX) 75 MG tablet  04/03/19   [provider]  denosumab (XGEVA) 120 MG/1.7ML SOLN injection Inject 120 mg into the skin once.    [provider]  famotidine (PEPCID) 40 MG tablet  05/01/19   [provider]  hyoscyamine (LEVSIN SL) 0.125 MG SL tablet Place 1 tablet (0.125 mg total) under the tongue every 6 (six) hours as needed for cramping. 03/28/19   Irine Seal, MD  isosorbide mononitrate  (IMDUR) 60 MG 24 hr tablet TAKE ONE TABLET BY MOUTH DAILY (MORNING) Patient taking differently: Take 60 mg by mouth every morning.  03/06/19   Satira Sark, MD  ketoconazole (NIZORAL) 2 % cream Apply 1 application topically 2 (two) times daily. Applied to the bottom of feet 12/29/18   [provider]  Leuprolide Acetate (LUPRON IJ) Inject as directed every 4 (four) months.     [provider]  linaclotide Rolan Lipa) 290 MCG CAPS capsule Take 1 capsule (290 mcg total) by mouth daily before breakfast. 02/23/19   Setzer, Rona Ravens, NP  magnesium citrate SOLN Take 1 Bottle by mouth once as needed for mild constipation or moderate constipation.  02/23/19   [provider]  metoprolol tartrate (LOPRESSOR) 50 MG tablet TAKE ONE TABLET BY MOUTH TWICE DAILY (MORNING ,  EVENING) Patient taking differently: Take 50 mg by mouth 2 (two) times daily.  11/17/18   Satira Sark, MD  mupirocin ointment (BACTROBAN) 2 % APPLY A SMALL AMOUNT TO THE AFFECTED AREA TWICE DAILY FOR 7 DAYS. 04/24/19   [provider]  nitroGLYCERIN (NITROSTAT) 0.4 MG SL tablet DISSOLVE ONE TABLET UNDER TONGUE EVERY 5 MINUTES UP TO 3 DOSES AS NEEDED FOR CHEST PAIN Patient not taking: Reported on 04/05/2019 03/27/19   Satira Sark, MD  ondansetron (ZOFRAN ODT) 4 MG disintegrating tablet Take 1 tablet (4 mg total) by mouth every 8 (eight) hours as needed. Patient not taking: Reported on 04/05/2019 03/26/19   Isla Pence, MD  pantoprazole (PROTONIX) 40 MG tablet Take 1 tablet (40 mg total) by mouth daily. 12/01/16   Satira Sark, MD  rosuvastatin (CRESTOR) 20 MG tablet TAKE ONE TABLET BY MOUTH DAILY (EVENING) Patient taking differently: Take 20 mg by mouth every evening.  03/06/19   Satira Sark, MD  STOOL SOFTENER 100 MG capsule Take 100-200 mg by mouth daily as needed for mild constipation.  01/02/19   [provider]  traMADol (ULTRAM) 50 MG tablet Take 0.5 tablets (25 mg total) by  mouth daily as needed. 12/01/18   Lockamy, Randi L, NP-C  XTANDI 40 MG capsule TAKE 4 CAPSULES (160 MG TOTAL) BY MOUTH DAILY. Patient taking differently: Take 80 mg by mouth every morning.  02/27/19   Derek Jack, MD    Family History Family History  Problem Relation Age of Onset  . Hypertension Father   . Coronary artery disease Father     Social History Social History   Tobacco Use  . Smoking status: Former Smoker    Packs/day: 0.50    Years: 27.00    Pack years: 13.50    Types: Cigars    Start date: 07/28/1964    Quit date: 07/28/1986    Years since quitting: 32.8  . Smokeless tobacco: Former Systems developer    Types: Chew    Quit date: 08/07/2010  . Tobacco comment: never chewed up over a pack/day  Substance Use Topics  . Alcohol use: No    Alcohol/week: 0.0 standard drinks  . Drug use: No     Allergies   Patient has no known allergies.   Review of Systems Review of Systems  Genitourinary: Positive for hematuria.  Musculoskeletal: Positive for back pain.  All other systems reviewed and are negative.    Physical Exam Updated Vital Signs BP (!) 155/72   Pulse 77   Temp 98.2 F (36.8 C) (Oral)   Resp 15   SpO2 96%   Physical Exam Vitals signs and nursing note reviewed.  Constitutional:      General: He is not in acute distress.    Appearance: Normal appearance. He is well-developed.  HENT:     Head: Normocephalic and atraumatic.     Right Ear: Hearing normal.     Left Ear: Hearing normal.     Nose: Nose normal.  Eyes:     Conjunctiva/sclera: Conjunctivae normal.     Pupils: Pupils are equal, round, and reactive to light.  Neck:     Musculoskeletal: Normal range of motion and neck supple.  Cardiovascular:     Rate and Rhythm: Regular rhythm.     Heart sounds: S1 normal and S2 normal. No murmur. No friction rub. No gallop.   Pulmonary:     Effort: Pulmonary effort is normal. No respiratory distress.  Breath sounds: Normal breath sounds.  Chest:      Chest wall: No tenderness.  Abdominal:     General: Bowel sounds are normal.     Palpations: Abdomen is soft.     Tenderness: There is no abdominal tenderness. There is no guarding or rebound. Negative signs include Murphy's sign and McBurney's sign.     Hernia: No hernia is present.  Musculoskeletal: Normal range of motion.  Skin:    General: Skin is warm and dry.     Findings: No rash.  Neurological:     Mental Status: He is alert and oriented to person, place, and time.     GCS: GCS eye subscore is 4. GCS verbal subscore is 5. GCS motor subscore is 6.     Cranial Nerves: No cranial nerve deficit.     Sensory: No sensory deficit.     Coordination: Coordination normal.  Psychiatric:        Speech: Speech normal.        Behavior: Behavior normal.        Thought Content: Thought content normal.      ED Treatments / Results  Labs (all labs ordered are listed, but only abnormal results are displayed) Labs Reviewed  URINALYSIS, ROUTINE W REFLEX MICROSCOPIC - Abnormal; Notable for the following components:      Result Value   Color, Urine RED (*)    APPearance CLOUDY (*)    Hgb urine dipstick MODERATE (*)    Protein, ur 100 (*)    Nitrite POSITIVE (*)    Leukocytes,Ua MODERATE (*)    RBC / HPF >50 (*)    WBC, UA >50 (*)    Bacteria, UA MANY (*)    All other components within normal limits  CBC - Abnormal; Notable for the following components:   RBC 4.15 (*)    Hemoglobin 12.2 (*)    HCT 38.8 (*)    All other components within normal limits  BASIC METABOLIC PANEL - Abnormal; Notable for the following components:   Glucose, Bld 108 (*)    Calcium 8.6 (*)    All other components within normal limits  URINE CULTURE    EKG None  Radiology No results found.  Procedures Procedures (including critical care time)  Medications Ordered in ED Medications  cefTRIAXone (ROCEPHIN) injection 1 g (has no administration in time range)  acetaminophen (TYLENOL) tablet 650  mg (650 mg Oral Given 05/10/19 0134)     Initial Impression / Assessment and Plan / ED Course  I have reviewed the triage vital signs and the nursing notes.  Pertinent labs & imaging results that were available during my care of the patient were reviewed by me and considered in my medical decision making (see chart for details).        Patient presents to the emergency department for evaluation of blood in the urine.  He reports that he saw bright red blood in his suprapubic catheter bag earlier tonight but it is slowly clearing.  He takes Plavix and Eliquis.  He had his Plavix stopped earlier because of the bleeding but bleeding continued.  At arrival he is in no distress.  Hemoglobin 12.2.  Vital signs are otherwise stable.  Urinalysis shows obvious infection which is likely cause of bleeding.  Patient treated with Rocephin here and will continue outpatient Keflex.  Culture pending.  Final Clinical Impressions(s) / ED Diagnoses   Final diagnoses:  Hemorrhagic cystitis    ED Discharge Orders  Ordered    cephALEXin (KEFLEX) 500 MG capsule  2 times daily     05/10/19 0307           Orpah Greek, MD 05/10/19 [phone]

## 2019-05-10 NOTE — ED Notes (Signed)
Urine and culture sent to lab  

## 2019-05-11 LAB — URINE CULTURE

## 2019-05-15 DIAGNOSIS — C61 Malignant neoplasm of prostate: Secondary | ICD-10-CM | POA: Diagnosis not present

## 2019-05-15 DIAGNOSIS — R339 Retention of urine, unspecified: Secondary | ICD-10-CM | POA: Diagnosis not present

## 2019-05-15 DIAGNOSIS — Z438 Encounter for attention to other artificial openings: Secondary | ICD-10-CM | POA: Diagnosis not present

## 2019-05-15 DIAGNOSIS — I160 Hypertensive urgency: Secondary | ICD-10-CM | POA: Diagnosis not present

## 2019-05-15 DIAGNOSIS — D649 Anemia, unspecified: Secondary | ICD-10-CM | POA: Diagnosis not present

## 2019-05-15 DIAGNOSIS — N1330 Unspecified hydronephrosis: Secondary | ICD-10-CM | POA: Diagnosis not present

## 2019-05-15 DIAGNOSIS — N390 Urinary tract infection, site not specified: Secondary | ICD-10-CM | POA: Diagnosis not present

## 2019-05-17 ENCOUNTER — Other Ambulatory Visit: Payer: Self-pay

## 2019-05-17 ENCOUNTER — Ambulatory Visit (INDEPENDENT_AMBULATORY_CARE_PROVIDER_SITE_OTHER): Payer: Medicare HMO | Admitting: Urology

## 2019-05-17 ENCOUNTER — Encounter: Payer: Self-pay | Admitting: Cardiology

## 2019-05-17 ENCOUNTER — Ambulatory Visit (INDEPENDENT_AMBULATORY_CARE_PROVIDER_SITE_OTHER): Payer: Medicare HMO | Admitting: Cardiology

## 2019-05-17 VITALS — BP 123/70 | HR 64 | Ht 68.0 in | Wt 195.6 lb

## 2019-05-17 DIAGNOSIS — E782 Mixed hyperlipidemia: Secondary | ICD-10-CM

## 2019-05-17 DIAGNOSIS — I25119 Atherosclerotic heart disease of native coronary artery with unspecified angina pectoris: Secondary | ICD-10-CM | POA: Diagnosis not present

## 2019-05-17 DIAGNOSIS — I480 Paroxysmal atrial fibrillation: Secondary | ICD-10-CM

## 2019-05-17 DIAGNOSIS — R3914 Feeling of incomplete bladder emptying: Secondary | ICD-10-CM

## 2019-05-17 DIAGNOSIS — I10 Essential (primary) hypertension: Secondary | ICD-10-CM | POA: Diagnosis not present

## 2019-05-17 NOTE — Progress Notes (Signed)
Cardiology Office Note  Date: 05/17/2019   ID: Elijah, Santana 25-Sep-1932, MRN [id number]  PCP:  Glenda Chroman, MD  Cardiologist:  Rozann Lesches, MD Electrophysiologist:  None   Chief Complaint  Patient presents with  . Coronary Artery Disease    History of Present Illness: Elijah Santana is an 85 y.o. male in November 2019.  He presents for a routine visit.  He does not report any active angina symptoms or nitroglycerin use.  His wife passed away about 2 months ago, they have been married for 7 years.  He is still going through the grieving process.  I reviewed his cardiac medications which are outlined below and stable.  He has had some issues with hematuria, but no other major bleeding problems.  He continues to follow with Urology and has a catheter in place.  I did review his recent lab work which showed a hemoglobin of 12.2.  Past Medical History:  Diagnosis Date  . Arthritis   . Asthma    Childhood  . Chronic back pain   . Coronary atherosclerosis of native coronary artery    a. CABG 2004 with LIMA-LAD, SVG-D1, SVG-OM, SVG-PDA. b. Cath ~2010 with occ of SVG-diagonal, distal LAD and OM filiing by collaterals. c. NSTEMI 12/2014 s/p DES to SVG-RCA. d. 11/2015: DES to distal graft of the SVG-distal RCA  . Essential hypertension   . Foley catheter in place   . GERD (gastroesophageal reflux disease)   . Hard of hearing   . Hyperglycemia   . Ischemic cardiomyopathy    a. EF 45% by cath 12/2014.  . Mixed hyperlipidemia   . NSTEMI (non-ST elevated myocardial infarction) ([place]) 01/01/15  . Paroxysmal atrial fibrillation (HCC)   . Pneumonia    hx of   . Prostate cancer [facility])    Radiation therapy 1996  . Skin cancer   . Stroke [facility])    TIA- 28 years ago     Past Surgical History:  Procedure Laterality Date  . CARDIAC CATHETERIZATION  01/01/2015   Procedure: CORONARY STENT INTERVENTION;  Surgeon: Peter M Martinique, MD;  Location: [facility] CATH LAB;  Service: Cardiovascular;;  SVG  to PDA  . CARDIAC CATHETERIZATION N/A 11/21/2015   Procedure: Left Heart Cath and Cors/Grafts Angiography;  Surgeon: Troy Sine, MD;  Location: [place] CV LAB;  Service: Cardiovascular;  Laterality: N/A;  . CARDIAC CATHETERIZATION N/A 11/21/2015   Procedure: Coronary Stent Intervention;  Surgeon: Troy Sine, MD;  Location: [place] CV LAB;  Service: Cardiovascular;  Laterality: N/A;  . CARDIAC CATHETERIZATION N/A 09/11/2016   Procedure: Left Heart Cath and Cors/Grafts Angiography;  Surgeon: Leonie Man, MD;  Location: [place] CV LAB;  Service: Cardiovascular;  Laterality: N/A;  . CARDIAC CATHETERIZATION N/A 09/11/2016   Procedure: Coronary Stent Intervention;  Surgeon: Leonie Man, MD;  Location: [place] CV LAB;  Service: Cardiovascular;  Laterality: N/A;  . CORONARY ARTERY BYPASS GRAFT  2004   LIMA to LAD, SVG to diagonal, SVG to circumflex, SVG to PDA  . CYSTOSCOPY WITH URETHRAL DILATATION N/A 03/28/2019   Procedure: CYSTOSCOPY WITH URETHRAL  DILATATION OF STRICTURE;  Surgeon: Irine Seal, MD;  Location: [facility];  Service: Urology;  Laterality: N/A;  . GREEN LIGHT LASER TURP (TRANSURETHRAL RESECTION OF PROSTATE N/A 06/04/2016   Procedure: GREEN LIGHT LASER TURP (TRANSURETHRAL RESECTION OF PROSTATE;  Surgeon: Irine Seal, MD;  Location: [facility]S;  Service: Urology;  Laterality: N/A;  . LEFT HEART  CATHETERIZATION WITH CORONARY/GRAFT ANGIOGRAM N/A 01/01/2015   Procedure: LEFT HEART CATHETERIZATION WITH Beatrix Fetters;  Surgeon: Peter M Martinique, MD;  Location: [facility] CATH LAB;  Service: Cardiovascular;  Laterality: N/A;  . PERCUTANEOUS CORONARY STENT INTERVENTION (PCI-S)  11/20/2015   distal SVG  with DES       . TONSILLECTOMY      Current Outpatient Medications  Medication Sig Dispense Refill  . acetaminophen (TYLENOL) 325 MG tablet Take 2 tablets (650 mg total) by mouth every 4 (four) hours as needed for headache or mild pain.    Marland Kitchen amLODipine (NORVASC) 5 MG tablet  TAKE ONE TABLET BY MOUTH DAILY (MORNING) (Patient taking differently: Take 5 mg by mouth every morning. ) 30 tablet 3  . bisacodyl (DULCOLAX) 5 MG EC tablet Take 5-10 mg by mouth daily as needed (constipation). Reported on 05/28/2016    . calcium carbonate (OS-CAL - DOSED IN MG OF ELEMENTAL CALCIUM) 1250 (500 Ca) MG tablet Take 1 tablet by mouth daily.     . clopidogrel (PLAVIX) 75 MG tablet     . denosumab (XGEVA) 120 MG/1.7ML SOLN injection Inject 120 mg into the skin once.    Marland Kitchen ELIQUIS 5 MG TABS tablet Take 5 mg by mouth 2 (two) times daily.     . famotidine (PEPCID) 40 MG tablet     . hyoscyamine (LEVSIN SL) 0.125 MG SL tablet Place 1 tablet (0.125 mg total) under the tongue every 6 (six) hours as needed for cramping. 30 tablet 2  . isosorbide mononitrate (IMDUR) 60 MG 24 hr tablet TAKE ONE TABLET BY MOUTH DAILY (MORNING) (Patient taking differently: Take 60 mg by mouth every morning. ) 30 tablet 6  . ketoconazole (NIZORAL) 2 % cream Apply 1 application topically 2 (two) times daily. Applied to the bottom of feet    . Leuprolide Acetate (LUPRON IJ) Inject as directed every 4 (four) months.     . linaclotide (LINZESS) 290 MCG CAPS capsule Take 1 capsule (290 mcg total) by mouth daily before breakfast. 30 capsule 3  . magnesium citrate SOLN Take 1 Bottle by mouth once as needed for mild constipation or moderate constipation.     . metoprolol tartrate (LOPRESSOR) 50 MG tablet TAKE ONE TABLET BY MOUTH TWICE DAILY (MORNING ,EVENING) (Patient taking differently: Take 50 mg by mouth 2 (two) times daily. ) 180 tablet 3  . mupirocin ointment (BACTROBAN) 2 % APPLY A SMALL AMOUNT TO THE AFFECTED AREA TWICE DAILY FOR 7 DAYS.    Marland Kitchen nitroGLYCERIN (NITROSTAT) 0.4 MG SL tablet DISSOLVE ONE TABLET UNDER TONGUE EVERY 5 MINUTES UP TO 3 DOSES AS NEEDED FOR CHEST PAIN 25 tablet 3  . ondansetron (ZOFRAN ODT) 4 MG disintegrating tablet Take 1 tablet (4 mg total) by mouth every 8 (eight) hours as needed. 10 tablet 0  .  pantoprazole (PROTONIX) 40 MG tablet Take 1 tablet (40 mg total) by mouth daily. 90 tablet 3  . rosuvastatin (CRESTOR) 20 MG tablet TAKE ONE TABLET BY MOUTH DAILY (EVENING) (Patient taking differently: Take 20 mg by mouth every evening. ) 30 tablet 6  . STOOL SOFTENER 100 MG capsule Take 100-200 mg by mouth daily as needed for mild constipation.     . traMADol (ULTRAM) 50 MG tablet Take 0.5 tablets (25 mg total) by mouth daily as needed. 30 tablet 0  . XTANDI 40 MG capsule TAKE 4 CAPSULES (160 MG TOTAL) BY MOUTH DAILY. (Patient taking differently: Take 80 mg by mouth every morning. ) 120 capsule  3   No current facility-administered medications for this visit.    Allergies:  Patient has no known allergies.   Social History: The patient  reports that he quit smoking about 32 years ago. His smoking use included cigars. He started smoking about 54 years ago. He has a 13.50 pack-year smoking history. He quit smokeless tobacco use about 8 years ago.  His smokeless tobacco use included chew. He reports that he does not drink alcohol or use drugs.   ROS:  Please see the history of present illness. Otherwise, complete review of systems is positive for hearing loss.  All other systems are reviewed and negative.   Physical Exam: VS:  BP 123/70   Pulse 64   Ht 5\' 8"  (1.727 m)   Wt 195 lb 9.6 oz (88.7 kg)   BMI 29.74 kg/m , BMI Body mass index is 29.74 kg/m.  Wt Readings from Last 3 Encounters:  05/17/19 195 lb 9.6 oz (88.7 kg)  04/05/19 201 lb 6 oz (91.3 kg)  03/28/19 215 lb 6.2 oz (97.7 kg)    General: Elderly male, appears comfortable at rest. HEENT: Conjunctiva and lids normal, oropharynx clear. Neck: Supple, no elevated JVP or carotid bruits, no thyromegaly. Lungs: Clear to auscultation, nonlabored breathing at rest. Cardiac: Regular rate and rhythm, no S3 or significant systolic murmur. Abdomen: Soft, nontender, bowel sounds present. Extremities: Stable ankle edema, distal pulses 2+.  Skin: Warm and dry. Musculoskeletal: No kyphosis. Neuropsychiatric: Alert and oriented x3, affect grossly appropriate.  ECG:  An ECG dated 03/23/2019 was personally reviewed today and demonstrated:  Normal sinus rhythm.  Recent Labwork: 05/03/2019: ALT 13; AST 13 05/10/2019: BUN 12; Creatinine, Ser 0.70; Hemoglobin 12.2; Platelets 254; Potassium 4.0; Sodium 140   Other Studies Reviewed Today:  Cardiac catheterization 09/11/2016:  SVG-OM3: Culprit Tandem lesions - Mid Graft to Dist Graft lesion, 70 %stenosed. Dist Graft to Insertion lesion, 95 %stenosed.  A STENT PROMUS PREM MR [id number] drug eluting stent was successfully placed covering both lesions. Post intervention, there is a 0% residual stenosis.   Additional Angiography:  Ost LAD lesion, 99 %stenosed. Prox LAD to Mid LAD lesion, 100 %stenosed after small D1.  LIMA-LAD and is normal in caliber. The LAD is grafted in between 2 diagonal branches. Downstream below the D3, Dist LAD lesion, 100 %stenosed - fills via collaterals from the circumflex system.  Prox Cx to Mid Cx lesion, 100 %stenosed. Ramus lesion, 99 %stenosed. - No change from previous cath  Prox RCA lesion, 100 %stenosed. Mid RCA to Dist RCA lesion, 100 %stenosed. The vessels actually more occluded than previously noted  SVG-dRCA: Prox Graft to Mid Graft stent, ~15 %stenosed, Dist Graft stent, 0 %stenosed.  SVG-Diag Origin lesion, 100 %stenosed.  The left ventricular systolic function is low normal. The left ventricular ejection fraction is 50-55% by visual estimate - there is a hint of possible mild anterolateral hypokinesis  LV end diastolic pressure is mildly elevated.  Successful PCI of tandem lesions in the SVG-OM 3. Otherwise angiographically similar coronary arteries and grafts compared to catheterization in January 2017.  Assessment and Plan:  1.  Multivessel CAD status post CABG with subsequently documented graft disease and multiple PCI's, most recently  in 2017.  He is clinically stable without progressive angina on medical therapy.  No changes were made today.  2.  Mixed hyperlipidemia.  He continues on Crestor.  3.  Essential hypertension, systolic blood pressures in the 120s today.  No changes made to current regimen.  4.  Paroxysmal atrial fibrillation.  He remains on Eliquis for stroke prophylaxis.  Recent lab work reviewed.  Medication Adjustments/Labs and Tests Ordered: Current medicines are reviewed at length with the patient today.  Concerns regarding medicines are outlined above.   Tests Ordered: No orders of the defined types were placed in this encounter.   Medication Changes: No orders of the defined types were placed in this encounter.   Disposition:  Follow up 6 months in the [place] office.  Signed, Satira Sark, MD, [facility] 05/17/2019 1:42 PM    [place] at [facility] [address] [place], [place] [address] Phone: [phone]; Fax: [phone]

## 2019-05-17 NOTE — Patient Instructions (Signed)

## 2019-05-24 DIAGNOSIS — Z438 Encounter for attention to other artificial openings: Secondary | ICD-10-CM | POA: Diagnosis not present

## 2019-05-24 DIAGNOSIS — I160 Hypertensive urgency: Secondary | ICD-10-CM | POA: Diagnosis not present

## 2019-05-24 DIAGNOSIS — N390 Urinary tract infection, site not specified: Secondary | ICD-10-CM | POA: Diagnosis not present

## 2019-05-24 DIAGNOSIS — R339 Retention of urine, unspecified: Secondary | ICD-10-CM | POA: Diagnosis not present

## 2019-05-24 DIAGNOSIS — D649 Anemia, unspecified: Secondary | ICD-10-CM | POA: Diagnosis not present

## 2019-05-24 DIAGNOSIS — C61 Malignant neoplasm of prostate: Secondary | ICD-10-CM | POA: Diagnosis not present

## 2019-05-24 DIAGNOSIS — N1330 Unspecified hydronephrosis: Secondary | ICD-10-CM | POA: Diagnosis not present

## 2019-05-25 ENCOUNTER — Emergency Department (HOSPITAL_COMMUNITY)
Admission: EM | Admit: 2019-05-25 | Discharge: 2019-05-25 | Disposition: A | Discharge status: Home / Self Care | Payer: Medicare HMO | Attending: Emergency Medicine | Admitting: Emergency Medicine

## 2019-05-25 ENCOUNTER — Encounter (HOSPITAL_COMMUNITY): Payer: Self-pay | Admitting: Emergency Medicine

## 2019-05-25 ENCOUNTER — Other Ambulatory Visit: Payer: Self-pay

## 2019-05-25 DIAGNOSIS — Y732 Prosthetic and other implants, materials and accessory gastroenterology and urology devices associated with adverse incidents: Secondary | ICD-10-CM | POA: Insufficient documentation

## 2019-05-25 DIAGNOSIS — Z87891 Personal history of nicotine dependence: Secondary | ICD-10-CM | POA: Diagnosis not present

## 2019-05-25 DIAGNOSIS — T83198A Other mechanical complication of other urinary devices and implants, initial encounter: Secondary | ICD-10-CM | POA: Diagnosis not present

## 2019-05-25 DIAGNOSIS — T83028A Displacement of other indwelling urethral catheter, initial encounter: Secondary | ICD-10-CM | POA: Diagnosis not present

## 2019-05-25 DIAGNOSIS — Z951 Presence of aortocoronary bypass graft: Secondary | ICD-10-CM | POA: Diagnosis not present

## 2019-05-25 DIAGNOSIS — J45909 Unspecified asthma, uncomplicated: Secondary | ICD-10-CM | POA: Insufficient documentation

## 2019-05-25 DIAGNOSIS — I2510 Atherosclerotic heart disease of native coronary artery without angina pectoris: Secondary | ICD-10-CM | POA: Diagnosis not present

## 2019-05-25 DIAGNOSIS — Z8673 Personal history of transient ischemic attack (TIA), and cerebral infarction without residual deficits: Secondary | ICD-10-CM | POA: Insufficient documentation

## 2019-05-25 DIAGNOSIS — Z8546 Personal history of malignant neoplasm of prostate: Secondary | ICD-10-CM | POA: Diagnosis not present

## 2019-05-25 DIAGNOSIS — T83010A Breakdown (mechanical) of cystostomy catheter, initial encounter: Secondary | ICD-10-CM

## 2019-05-25 DIAGNOSIS — Z79899 Other long term (current) drug therapy: Secondary | ICD-10-CM | POA: Diagnosis not present

## 2019-05-25 NOTE — ED Provider Notes (Signed)
Patient presents to [facility] Provider Note   CSN: [id number] Arrival date & time: 05/25/19  0138     History   Chief Complaint Chief Complaint  Patient presents with  . Catheter Problem    HPI Elijah Santana is a 85 y.o. male.     Patient presents to the emergency department for suprapubic catheter problem.  He has had a suprapubic catheter in place for approximately 6 weeks secondary to urethral stricture.  He thinks that it got caught up on something in bed tonight and accidentally dislodged.  It has been out for approximately an hour and a half.  He is not experiencing any pain.     Past Medical History:  Diagnosis Date  . Arthritis   . Asthma    Childhood  . Chronic back pain   . Coronary atherosclerosis of native coronary artery    a. CABG 2004 with LIMA-LAD, SVG-D1, SVG-OM, SVG-PDA. b. Cath ~2010 with occ of SVG-diagonal, distal LAD and OM filiing by collaterals. c. NSTEMI 12/2014 s/p DES to SVG-RCA. d. 11/2015: DES to distal graft of the SVG-distal RCA  . Essential hypertension   . Foley catheter in place   . GERD (gastroesophageal reflux disease)   . Hard of hearing   . Hyperglycemia   . Ischemic cardiomyopathy    a. EF 45% by cath 12/2014.  . Mixed hyperlipidemia   . NSTEMI (non-ST elevated myocardial infarction) ([place]) 01/01/15  . Paroxysmal atrial fibrillation (HCC)   . Pneumonia    hx of   . Prostate cancer [facility])    Radiation therapy 1996  . Skin cancer   . Stroke [facility])    TIA- 28 years ago     Patient Active Problem List   Diagnosis Date Noted  . Urinary retention 03/24/2019  . Bilateral hydronephrosis 03/24/2019  . Uncontrolled hypertension 03/24/2019  . Acute lower UTI 03/24/2019  . Anemia 03/24/2019  . Bone metastasis ([place]) 04/08/2018  . Prostate cancer ([place]) 11/27/2017  . Atrial fibrillation, rapid -new 09/10/16 09/10/2016  . PAF- in setting of NSTEMI 09/10/2016  . Accelerating angina ([place])   . CAD S/P percutaneous  coronary angioplasty   . Coronary artery disease with hx of myocardial infarct w/o hx of CABG 03/21/2015  . Ischemic cardiomyopathy   . Hyperglycemia   . Essential hypertension   . BPH (benign prostatic hyperplasia)   . NSTEMI- Troponin peak 6/47 12/31/2014  . S/P CABG x 4 2004 12/31/2014  . GERD (gastroesophageal reflux disease) 04/29/2012  . Mixed hyperlipidemia   . Essential hypertension, benign 08/06/2009    Past Surgical History:  Procedure Laterality Date  . CARDIAC CATHETERIZATION  01/01/2015   Procedure: CORONARY STENT INTERVENTION;  Surgeon: Peter M Martinique, MD;  Location: [facility] CATH LAB;  Service: Cardiovascular;;  SVG to PDA  . CARDIAC CATHETERIZATION N/A 11/21/2015   Procedure: Left Heart Cath and Cors/Grafts Angiography;  Surgeon: Troy Sine, MD;  Location: [place] CV LAB;  Service: Cardiovascular;  Laterality: N/A;  . CARDIAC CATHETERIZATION N/A 11/21/2015   Procedure: Coronary Stent Intervention;  Surgeon: Troy Sine, MD;  Location: [place] CV LAB;  Service: Cardiovascular;  Laterality: N/A;  . CARDIAC CATHETERIZATION N/A 09/11/2016   Procedure: Left Heart Cath and Cors/Grafts Angiography;  Surgeon: Leonie Man, MD;  Location: [place] CV LAB;  Service: Cardiovascular;  Laterality: N/A;  . CARDIAC CATHETERIZATION N/A 09/11/2016   Procedure: Coronary Stent Intervention;  Surgeon: Leonie Man, MD;  Location: [facility]  INVASIVE CV LAB;  Service: Cardiovascular;  Laterality: N/A;  . CORONARY ARTERY BYPASS GRAFT  2004   LIMA to LAD, SVG to diagonal, SVG to circumflex, SVG to PDA  . CYSTOSCOPY WITH URETHRAL DILATATION N/A 03/28/2019   Procedure: CYSTOSCOPY WITH URETHRAL  DILATATION OF STRICTURE;  Surgeon: Irine Seal, MD;  Location: [facility] ORS;  Service: Urology;  Laterality: N/A;  . GREEN LIGHT LASER TURP (TRANSURETHRAL RESECTION OF PROSTATE N/A 06/04/2016   Procedure: GREEN LIGHT LASER TURP (TRANSURETHRAL RESECTION OF PROSTATE;  Surgeon: Irine Seal, MD;  Location: [facility]  ORS;  Service: Urology;  Laterality: N/A;  . LEFT HEART CATHETERIZATION WITH CORONARY/GRAFT ANGIOGRAM N/A 01/01/2015   Procedure: LEFT HEART CATHETERIZATION WITH Beatrix Fetters;  Surgeon: Peter M Martinique, MD;  Location: [facility] CATH LAB;  Service: Cardiovascular;  Laterality: N/A;  . PERCUTANEOUS CORONARY STENT INTERVENTION (PCI-S)  11/20/2015   distal SVG  with DES       . TONSILLECTOMY          Home Medications    Prior to Admission medications   Medication Sig Start Date End Date Taking? Authorizing Provider  acetaminophen (TYLENOL) 325 MG tablet Take 2 tablets (650 mg total) by mouth every 4 (four) hours as needed for headache or mild pain. 09/12/16   Erlene Quan, PA-C  amLODipine (NORVASC) 5 MG tablet TAKE ONE TABLET BY MOUTH DAILY (MORNING) Patient taking differently: Take 5 mg by mouth every morning.  11/22/17   Satira Sark, MD  bisacodyl (DULCOLAX) 5 MG EC tablet Take 5-10 mg by mouth daily as needed (constipation). Reported on 05/28/2016    [provider]  calcium carbonate (OS-CAL - DOSED IN MG OF ELEMENTAL CALCIUM) 1250 (500 Ca) MG tablet Take 1 tablet by mouth daily.     [provider]  clopidogrel (PLAVIX) 75 MG tablet  04/03/19   [provider]  denosumab (XGEVA) 120 MG/1.7ML SOLN injection Inject 120 mg into the skin once.    [provider]  ELIQUIS 5 MG TABS tablet Take 5 mg by mouth 2 (two) times daily.  04/03/19   [provider]  famotidine (PEPCID) 40 MG tablet  05/01/19   [provider]  hyoscyamine (LEVSIN SL) 0.125 MG SL tablet Place 1 tablet (0.125 mg total) under the tongue every 6 (six) hours as needed for cramping. 03/28/19   Irine Seal, MD  isosorbide mononitrate (IMDUR) 60 MG 24 hr tablet TAKE ONE TABLET BY MOUTH DAILY (MORNING) Patient taking differently: Take 60 mg by mouth every morning.  03/06/19   Satira Sark, MD  ketoconazole (NIZORAL) 2 % cream Apply 1 application topically 2 (two)  times daily. Applied to the bottom of feet 12/29/18   [provider]  Leuprolide Acetate (LUPRON IJ) Inject as directed every 4 (four) months.     [provider]  linaclotide Rolan Lipa) 290 MCG CAPS capsule Take 1 capsule (290 mcg total) by mouth daily before breakfast. 02/23/19   Setzer, Rona Ravens, NP  magnesium citrate SOLN Take 1 Bottle by mouth once as needed for mild constipation or moderate constipation.  02/23/19   [provider]  metoprolol tartrate (LOPRESSOR) 50 MG tablet TAKE ONE TABLET BY MOUTH TWICE DAILY (MORNING ,EVENING) Patient taking differently: Take 50 mg by mouth 2 (two) times daily.  11/17/18   Satira Sark, MD  mupirocin ointment (BACTROBAN) 2 % APPLY A SMALL AMOUNT TO THE AFFECTED AREA TWICE DAILY FOR 7 DAYS. 04/24/19   [provider]  nitroGLYCERIN (NITROSTAT) 0.4 MG SL tablet DISSOLVE ONE TABLET UNDER TONGUE EVERY 5 MINUTES UP TO 3 DOSES AS NEEDED FOR CHEST PAIN 03/27/19   Satira Sark, MD  ondansetron (ZOFRAN ODT) 4 MG disintegrating tablet Take 1 tablet (4 mg total) by mouth every 8 (eight) hours as needed. 03/26/19   Isla Pence, MD  pantoprazole (PROTONIX) 40 MG tablet Take 1 tablet (40 mg total) by mouth daily. 12/01/16   Satira Sark, MD  rosuvastatin (CRESTOR) 20 MG tablet TAKE ONE TABLET BY MOUTH DAILY (EVENING) Patient taking differently: Take 20 mg by mouth every evening.  03/06/19   Satira Sark, MD  STOOL SOFTENER 100 MG capsule Take 100-200 mg by mouth daily as needed for mild constipation.  01/02/19   [provider]  traMADol (ULTRAM) 50 MG tablet Take 0.5 tablets (25 mg total) by mouth daily as needed. 12/01/18   Lockamy, Randi L, NP-C  XTANDI 40 MG capsule TAKE 4 CAPSULES (160 MG TOTAL) BY MOUTH DAILY. Patient taking differently: Take 80 mg by mouth every morning.  02/27/19   Derek Jack, MD    Family History Family History  Problem Relation Age of Onset  . Hypertension Father   .  Coronary artery disease Father     Social History Social History   Tobacco Use  . Smoking status: Former Smoker    Packs/day: 0.50    Years: 27.00    Pack years: 13.50    Types: Cigars    Start date: 07/28/1964    Quit date: 07/28/1986    Years since quitting: 32.8  . Smokeless tobacco: Former Systems developer    Types: Chew    Quit date: 08/07/2010  . Tobacco comment: never chewed up over a pack/day  Substance Use Topics  . Alcohol use: No    Alcohol/week: 0.0 standard drinks  . Drug use: No     Allergies   Patient has no known allergies.   Review of Systems Review of Systems  Genitourinary: Negative for flank pain and hematuria.  All other systems reviewed and are negative.    Physical Exam Updated Vital Signs BP (!) 159/85 (BP Location: Right Arm)   Pulse 62   Temp 97.8 F (36.6 C) (Oral)   Resp 17   SpO2 100%   Physical Exam Vitals signs and nursing note reviewed. Exam conducted with a chaperone present.  Constitutional:      Appearance: Normal appearance.  HENT:     Head: Atraumatic.  Neck:     Musculoskeletal: Normal range of motion.  Cardiovascular:     Rate and Rhythm: Normal rate and regular rhythm.  Pulmonary:     Effort: Pulmonary effort is normal.     Breath sounds: Normal breath sounds.  Abdominal:     Palpations: Abdomen is soft.     Tenderness: There is no abdominal tenderness.  Skin:    Comments: Stoma identified in suprapubic region, no erythema bleeding or obvious trauma.  Neurological:     Mental Status: He is alert.      ED Treatments / Results  Labs (all labs ordered are listed, but only abnormal results are displayed) Labs Reviewed - No data to display  EKG None  Radiology No results found.  Procedures SUPRAPUBIC TUBE PLACEMENT  Date/Time: 05/25/2019 2:17 AM Performed by: Orpah Greek, MD Authorized by: Orpah Greek, MD   Consent:    Consent obtained:  Verbal   Consent given by:  Patient   Risks  discussed:  Bleeding, infection and pain Universal protocol:    Procedure explained and questions answered to patient or proxy's satisfaction: yes     Site/side marked: yes     Immediately prior to procedure a time out was called: yes     Patient identity confirmed:  Verbally with patient Anesthesia (see MAR for exact dosages):    Anesthesia method:  Local infiltration   Local anesthetic:  Lidocaine 1% w/o epi Procedure details:    Complexity:  Simple   Catheter type:  Foley   Catheter size:  16 Fr   Ultrasound guidance: no     Number of attempts:  1   Urine characteristics:  Blood-tinged Post-procedure details:    Patient tolerance of procedure:  Tolerated well, no immediate complications   (including critical care time)  Medications Ordered in ED Medications - No data to display   Initial Impression / Assessment and Plan / ED Course  I have reviewed the triage vital signs and the nursing notes.  Pertinent labs & imaging results that were available during my care of the patient were reviewed by me and considered in my medical decision making (see chart for details).        Patient accidentally pulled out his suprapubic catheter tonight.  I was able to replace it with same size Foley catheter.  Patient tolerated this procedure well.  Catheter is draining obvious urine that is clear, non-cloudy.  There was initially a small amount of blood from the trauma of reopening the stoma, but this quickly cleared and patient is without complaints.  Final Clinical Impressions(s) / ED Diagnoses   Final diagnoses:  Suprapubic catheter dysfunction, initial encounter [facility])    ED Discharge Orders    None       Orpah Greek, MD 05/25/19 [id number]

## 2019-05-25 NOTE — ED Triage Notes (Signed)
Pt states he went to get out of the bed and his catheter "got caught up on something and the catheter came out." Pts catheter balloon was deflated upon arrival to the ED.

## 2019-05-30 ENCOUNTER — Other Ambulatory Visit (HOSPITAL_COMMUNITY): Payer: Self-pay | Admitting: *Deleted

## 2019-05-30 DIAGNOSIS — C7951 Secondary malignant neoplasm of bone: Secondary | ICD-10-CM

## 2019-05-30 DIAGNOSIS — C61 Malignant neoplasm of prostate: Secondary | ICD-10-CM

## 2019-05-31 ENCOUNTER — Other Ambulatory Visit: Payer: Self-pay

## 2019-05-31 ENCOUNTER — Encounter (HOSPITAL_COMMUNITY): Payer: Self-pay | Admitting: Hematology

## 2019-05-31 ENCOUNTER — Inpatient Hospital Stay (HOSPITAL_COMMUNITY): Payer: Medicare HMO

## 2019-05-31 ENCOUNTER — Inpatient Hospital Stay (HOSPITAL_COMMUNITY): Payer: Medicare HMO | Admitting: Hematology

## 2019-05-31 ENCOUNTER — Inpatient Hospital Stay (HOSPITAL_COMMUNITY): Payer: Medicare HMO | Attending: Hematology

## 2019-05-31 VITALS — BP 135/72 | HR 63 | Temp 97.3°F | Resp 18 | Wt 191.0 lb

## 2019-05-31 DIAGNOSIS — Z79899 Other long term (current) drug therapy: Secondary | ICD-10-CM

## 2019-05-31 DIAGNOSIS — K5900 Constipation, unspecified: Secondary | ICD-10-CM

## 2019-05-31 DIAGNOSIS — R200 Anesthesia of skin: Secondary | ICD-10-CM

## 2019-05-31 DIAGNOSIS — Z8249 Family history of ischemic heart disease and other diseases of the circulatory system: Secondary | ICD-10-CM

## 2019-05-31 DIAGNOSIS — Z87891 Personal history of nicotine dependence: Secondary | ICD-10-CM

## 2019-05-31 DIAGNOSIS — G893 Neoplasm related pain (acute) (chronic): Secondary | ICD-10-CM | POA: Insufficient documentation

## 2019-05-31 DIAGNOSIS — I4891 Unspecified atrial fibrillation: Secondary | ICD-10-CM | POA: Diagnosis not present

## 2019-05-31 DIAGNOSIS — C61 Malignant neoplasm of prostate: Secondary | ICD-10-CM

## 2019-05-31 DIAGNOSIS — G4700 Insomnia, unspecified: Secondary | ICD-10-CM | POA: Insufficient documentation

## 2019-05-31 DIAGNOSIS — Z7901 Long term (current) use of anticoagulants: Secondary | ICD-10-CM | POA: Diagnosis not present

## 2019-05-31 DIAGNOSIS — C7951 Secondary malignant neoplasm of bone: Secondary | ICD-10-CM | POA: Insufficient documentation

## 2019-05-31 DIAGNOSIS — C771 Secondary and unspecified malignant neoplasm of intrathoracic lymph nodes: Secondary | ICD-10-CM

## 2019-05-31 DIAGNOSIS — I10 Essential (primary) hypertension: Secondary | ICD-10-CM | POA: Diagnosis not present

## 2019-05-31 LAB — CBC WITH DIFFERENTIAL/PLATELET
Abs Immature Granulocytes: 0.02 10*3/uL (ref 0.00–0.07)
Basophils Absolute: 0 10*3/uL (ref 0.0–0.1)
Basophils Relative: 1 %
Eosinophils Absolute: 0.3 10*3/uL (ref 0.0–0.5)
Eosinophils Relative: 5 %
HCT: 40.9 % (ref 39.0–52.0)
Hemoglobin: 12.7 g/dL — ABNORMAL LOW (ref 13.0–17.0)
Immature Granulocytes: 0 %
Lymphocytes Relative: 24 %
Lymphs Abs: 1.5 10*3/uL (ref 0.7–4.0)
MCH: 29.3 pg (ref 26.0–34.0)
MCHC: 31.1 g/dL (ref 30.0–36.0)
MCV: 94.2 fL (ref 80.0–100.0)
Monocytes Absolute: 0.4 10*3/uL (ref 0.1–1.0)
Monocytes Relative: 7 %
Neutro Abs: 4 10*3/uL (ref 1.7–7.7)
Neutrophils Relative %: 63 %
Platelets: 271 10*3/uL (ref 150–400)
RBC: 4.34 MIL/uL (ref 4.22–5.81)
RDW: 14.4 % (ref 11.5–15.5)
WBC: 6.3 10*3/uL (ref 4.0–10.5)
nRBC: 0 % (ref 0.0–0.2)

## 2019-05-31 LAB — COMPREHENSIVE METABOLIC PANEL
ALT: 11 U/L (ref 0–44)
AST: 12 U/L — ABNORMAL LOW (ref 15–41)
Albumin: 3.9 g/dL (ref 3.5–5.0)
Alkaline Phosphatase: 38 U/L (ref 38–126)
Anion gap: 9 (ref 5–15)
BUN: 20 mg/dL (ref 8–23)
CO2: 25 mmol/L (ref 22–32)
Calcium: 9.3 mg/dL (ref 8.9–10.3)
Chloride: 106 mmol/L (ref 98–111)
Creatinine, Ser: 0.78 mg/dL (ref 0.61–1.24)
GFR calc Af Amer: >60 mL/min (ref >60)
GFR calc non Af Amer: >60 mL/min (ref >60)
Glucose, Bld: 100 mg/dL — ABNORMAL HIGH (ref 70–99)
Potassium: 4.4 mmol/L (ref 3.5–5.1)
Sodium: 140 mmol/L (ref 135–145)
Total Bilirubin: 0.7 mg/dL (ref 0.3–1.2)
Total Protein: 6.9 g/dL (ref 6.5–8.1)

## 2019-05-31 LAB — PSA: Prostatic Specific Antigen: 0.17 ng/mL (ref 0.00–4.00)

## 2019-05-31 MED ORDER — DENOSUMAB 120 MG/1.7ML ~~LOC~~ SOLN
SUBCUTANEOUS | Status: AC
  Filled 2019-05-31: qty 1.7

## 2019-05-31 MED ORDER — DENOSUMAB 120 MG/1.7ML ~~LOC~~ SOLN
120.0000 mg | Freq: Once | SUBCUTANEOUS | Status: AC
  Administered 2019-05-31: 11:00:00 120 mg via SUBCUTANEOUS

## 2019-05-31 NOTE — Assessment & Plan Note (Signed)
1.  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 - Fluciclovine PET CT scan showed lymphadenopathy in the chest and sclerotic lesion in the right ilium, diffuse enhancement of the prostate. - Enzalutamide was started on 02/08/2018 at 80 mg daily, increased to 4 tablets daily in the first week of June 2019. - He received last Lupron injection at Dr. Jethro Poling office in mid May 2020. - He reportedly had hematuria from suprapubic catheter and went to the ER on 05/10/2019. - As he was having pain at the catheter site, he was told to cut back on enzalutamide to 80 mg daily.  However his pain in the suprapubic region as well as left hip did not improve. - Last PSA on 05/03/2019 was 0.25.  This is slightly gone up from 0.17.  As it did not improve his pains, I have told him to go back on full dose enzalutamide. -I will see him back in 6 to 8 weeks for follow-up and repeat labs.  2.  Bone metastasis: -He had 1 metastatic site on fluciclovine PET CT scan on the right ilium. -Denosumab was started on 05/02/2018 which she is tolerating it very well. -He will continue calcium twice daily.  3.  Paroxysmal atrial fibrillation: - He is continuing to be on Eliquis.

## 2019-05-31 NOTE — Patient Instructions (Addendum)
[facility] at [facility] Discharge Instructions  You were seen today by Dr. Katragadda. He went over your recent lab results. He will see you back in 6 weeks for labs and follow up.   Thank you for choosing [facility] at [facility] to provide your oncology and hematology care.  To afford each patient quality time with our provider, please arrive at least 15 minutes before your scheduled appointment time.   If you have a lab appointment with the Cancer Center please come in thru the  Main Entrance and check in at the main information desk  You need to re-schedule your appointment should you arrive 10 or more minutes late.  We strive to give you quality time with our providers, and arriving late affects you and other patients whose appointments are after yours.  Also, if you no show three or more times for appointments you may be dismissed from the clinic at the providers discretion.     Again, thank you for choosing [facility].  Our hope is that these requests will decrease the amount of time that you wait before being seen by our physicians.       _____________________________________________________________  Should you have questions after your visit to [facility], please contact our office at [phone] between the hours of 8:00 a.m. and 4:30 p.m.  Voicemails left after 4:00 p.m. will not be returned until the following business day.  For prescription refill requests, have your pharmacy contact our office and allow 72 hours.    Cancer Center Support Programs:   > Cancer Support Group  2nd Tuesday of the month 1pm-2pm, Journey Room    

## 2019-05-31 NOTE — Progress Notes (Signed)
xgeva given per orders.  Patient tolerated it well without problems. Vitals stable and discharged home from clinic ambulatory. Follow up as scheduled.]  

## 2019-05-31 NOTE — Progress Notes (Signed)
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   INTERVAL HISTORY:  Elijah Santana 85 y.o. male returns for follow up of prostate cancer.  He is currently taking enzalutamide 80 mg daily.  Appetite is 100%.  Energy levels are 25%.  Pain is reported as 5/10 in the suprapubic area and in the left hip region.  He reportedly went to the ER on 05/10/2019 with hematuria.  He is on Eliquis for paroxysmal atrial fibrillation.  He has mild constipation.  He denies any falls.  Numbness in the feet has been stable.  Denies any fevers or chills.    REVIEW OF SYSTEMS:  Review of Systems  Gastrointestinal: Positive for constipation.  Neurological: Positive for numbness.  Psychiatric/Behavioral: Positive for sleep disturbance.  All other systems reviewed and are negative.    PAST MEDICAL/SURGICAL HISTORY:  Past Medical History:  Diagnosis Date  . Arthritis   . Asthma    Childhood  . Chronic back pain   . Coronary atherosclerosis of native coronary artery    a. CABG 2004 with LIMA-LAD, SVG-D1, SVG-OM, SVG-PDA. b. Cath ~2010 with occ of SVG-diagonal, distal LAD and OM filiing by collaterals. c. NSTEMI 12/2014 s/p DES to SVG-RCA. d. 11/2015: DES to distal graft of the SVG-distal RCA  . Essential hypertension   . Foley catheter in place   . GERD (gastroesophageal reflux disease)   . Hard of hearing   . Hyperglycemia   . Ischemic cardiomyopathy    a. EF 45% by cath 12/2014.  . Mixed hyperlipidemia   . NSTEMI (non-ST elevated myocardial  infarction) ([place]) 01/01/15  . Paroxysmal atrial fibrillation ([facility])   . Pneumonia    hx of   . Prostate cancer [facility])    Radiation therapy 1996  . Skin cancer   . Stroke [facility])    TIA- 28 years ago    Past Surgical History:  Procedure Laterality Date  . CARDIAC CATHETERIZATION  01/01/2015   Procedure: CORONARY STENT INTERVENTION;  Surgeon: Peter M Martinique, MD;  Location: [facility] CATH LAB;  Service: Cardiovascular;;  SVG to PDA  . CARDIAC CATHETERIZATION N/A 11/21/2015   Procedure: Left Heart Cath and Cors/Grafts Angiography;  Surgeon: Troy Sine, MD;  Location: [place] CV LAB;  Service: Cardiovascular;  Laterality: N/A;  . CARDIAC CATHETERIZATION N/A 11/21/2015   Procedure: Coronary Stent Intervention;  Surgeon: Troy Sine, MD;  Location: [place] CV LAB;  Service: Cardiovascular;  Laterality: N/A;  . CARDIAC CATHETERIZATION N/A 09/11/2016   Procedure: Left Heart Cath and Cors/Grafts Angiography;  Surgeon: Leonie Man, MD;  Location: [place] CV LAB;  Service: Cardiovascular;  Laterality: N/A;  . CARDIAC CATHETERIZATION N/A 09/11/2016   Procedure: Coronary Stent Intervention;  Surgeon: Leonie Man, MD;  Location: [place] CV LAB;  Service: Cardiovascular;  Laterality: N/A;  . CORONARY ARTERY BYPASS GRAFT  2004   LIMA to LAD, SVG to diagonal, SVG to circumflex, SVG to PDA  . CYSTOSCOPY WITH URETHRAL DILATATION N/A 03/28/2019   Procedure: CYSTOSCOPY  WITH URETHRAL  DILATATION OF STRICTURE;  Surgeon: Irine Seal, MD;  Location: [facility] ORS;  Service: Urology;  Laterality: N/A;  . GREEN LIGHT LASER TURP (TRANSURETHRAL RESECTION OF PROSTATE N/A 06/04/2016   Procedure: GREEN LIGHT LASER TURP (TRANSURETHRAL RESECTION OF PROSTATE;  Surgeon: Irine Seal, MD;  Location: [facility] ORS;  Service: Urology;  Laterality: N/A;  . LEFT HEART CATHETERIZATION WITH CORONARY/GRAFT ANGIOGRAM N/A 01/01/2015   Procedure: LEFT HEART CATHETERIZATION WITH Beatrix Fetters;  Surgeon: Peter M Martinique, MD;   Location: [facility] CATH LAB;  Service: Cardiovascular;  Laterality: N/A;  . PERCUTANEOUS CORONARY STENT INTERVENTION (PCI-S)  11/20/2015   distal SVG  with DES       . TONSILLECTOMY       SOCIAL HISTORY:  Social History   Socioeconomic History  . Marital status: Married    Spouse name: Not on file  . Number of children: Not on file  . Years of education: Not on file  . Highest education level: Not on file  Occupational History  . Occupation: Retired    Comment: Office manager  Social Needs  . Financial resource strain: Not on file  . Food insecurity    Worry: Not on file    Inability: Not on file  . Transportation needs    Medical: Not on file    Non-medical: Not on file  Tobacco Use  . Smoking status: Former Smoker    Packs/day: 0.50    Years: 27.00    Pack years: 13.50    Types: Cigars    Start date: 07/28/1964    Quit date: 07/28/1986    Years since quitting: 32.8  . Smokeless tobacco: Former Systems developer    Types: Chew    Quit date: 08/07/2010  . Tobacco comment: never chewed up over a pack/day  Substance and Sexual Activity  . Alcohol use: No    Alcohol/week: 0.0 standard drinks  . Drug use: No  . Sexual activity: Not on file  Lifestyle  . Physical activity    Days per week: Not on file    Minutes per session: Not on file  . Stress: Not on file  Relationships  . Social Herbalist on phone: Not on file    Gets together: Not on file    Attends religious service: Not on file    Active member of club or organization: Not on file    Attends meetings of clubs or organizations: Not on file    Relationship status: Not on file  . Intimate partner violence    Fear of current or ex partner: Not on file    Emotionally abused: Not on file    Physically abused: Not on file    Forced sexual activity: Not on file  Other Topics Concern  . Not on file  Social History Narrative  . Not on file    FAMILY HISTORY:  Family History  Problem Relation Age of Onset   . Hypertension Father   . Coronary artery disease Father     CURRENT MEDICATIONS:  Outpatient Encounter Medications as of 05/31/2019  Medication Sig Note  . acetaminophen (TYLENOL) 325 MG tablet Take 2 tablets (650 mg total) by mouth every 4 (four) hours as needed for headache or mild pain.   Marland Kitchen amLODipine (NORVASC) 5 MG tablet TAKE ONE TABLET BY MOUTH DAILY (MORNING) (Patient taking differently: Take 5 mg by mouth every morning. )   . bisacodyl (DULCOLAX) 5 MG EC tablet Take 5-10 mg by  mouth daily as needed (constipation). Reported on 05/28/2016   . calcium carbonate (OS-CAL - DOSED IN MG OF ELEMENTAL CALCIUM) 1250 (500 Ca) MG tablet Take 1 tablet by mouth daily.    . clopidogrel (PLAVIX) 75 MG tablet    . denosumab (XGEVA) 120 MG/1.7ML SOLN injection Inject 120 mg into the skin once.   Marland Kitchen ELIQUIS 5 MG TABS tablet Take 5 mg by mouth 2 (two) times daily.    . famotidine (PEPCID) 40 MG tablet    . hyoscyamine (LEVSIN SL) 0.125 MG SL tablet Place 1 tablet (0.125 mg total) under the tongue every 6 (six) hours as needed for cramping.   . isosorbide mononitrate (IMDUR) 60 MG 24 hr tablet TAKE ONE TABLET BY MOUTH DAILY (MORNING) (Patient taking differently: Take 60 mg by mouth every morning. )   . ketoconazole (NIZORAL) 2 % cream Apply 1 application topically 2 (two) times daily. Applied to the bottom of feet   . Leuprolide Acetate (LUPRON IJ) Inject as directed every 4 (four) months.  03/26/2019: Administered by the office of Dr. Jeffie Pollock every 4 months   . linaclotide (LINZESS) 290 MCG CAPS capsule Take 1 capsule (290 mcg total) by mouth daily before breakfast.   . magnesium citrate SOLN Take 1 Bottle by mouth once as needed for mild constipation or moderate constipation.    . metoprolol tartrate (LOPRESSOR) 50 MG tablet TAKE ONE TABLET BY MOUTH TWICE DAILY (MORNING ,EVENING) (Patient taking differently: Take 50 mg by mouth 2 (two) times daily. )   . mupirocin ointment (BACTROBAN) 2 % APPLY A SMALL AMOUNT  TO THE AFFECTED AREA TWICE DAILY FOR 7 DAYS.   Marland Kitchen nitroGLYCERIN (NITROSTAT) 0.4 MG SL tablet DISSOLVE ONE TABLET UNDER TONGUE EVERY 5 MINUTES UP TO 3 DOSES AS NEEDED FOR CHEST PAIN   . ondansetron (ZOFRAN ODT) 4 MG disintegrating tablet Take 1 tablet (4 mg total) by mouth every 8 (eight) hours as needed.   . pantoprazole (PROTONIX) 40 MG tablet Take 1 tablet (40 mg total) by mouth daily.   . rosuvastatin (CRESTOR) 20 MG tablet TAKE ONE TABLET BY MOUTH DAILY (EVENING) (Patient taking differently: Take 20 mg by mouth every evening. )   . STOOL SOFTENER 100 MG capsule Take 100-200 mg by mouth daily as needed for mild constipation.    . sulfamethoxazole-trimethoprim (BACTRIM DS) 800-160 MG tablet Take 1 tablet by mouth 2 (two) times daily.   . traMADol (ULTRAM) 50 MG tablet Take 0.5 tablets (25 mg total) by mouth daily as needed.   Gillermina Phy 40 MG capsule TAKE 4 CAPSULES (160 MG TOTAL) BY MOUTH DAILY. (Patient taking differently: Take 80 mg by mouth every morning. )    No facility-administered encounter medications on file as of 05/31/2019.     ALLERGIES:  No Known Allergies   PHYSICAL EXAM:  ECOG Performance status: 1  Vitals:   05/31/19 1023  BP: 135/72  Pulse: 63  Resp: 18  Temp: (!) 97.3 F (36.3 C)  SpO2: 98%   Filed Weights   05/31/19 1023  Weight: 191 lb (86.6 kg)    Physical Exam Vitals signs reviewed.  Constitutional:      Appearance: Normal appearance.  Cardiovascular:     Rate and Rhythm: Normal rate and regular rhythm.     Heart sounds: Normal heart sounds.  Pulmonary:     Effort: Pulmonary effort is normal.     Breath sounds: Normal breath sounds.  Abdominal:     General: There is no  distension.     Palpations: Abdomen is soft. There is no mass.  Musculoskeletal:        General: No swelling.  Skin:    General: Skin is warm.  Neurological:     General: No focal deficit present.     Mental Status: He is alert and oriented to person, place, and time.   Psychiatric:        Mood and Affect: Mood normal.        Behavior: Behavior normal.      LABORATORY DATA:  I have reviewed the labs as listed.  CBC    Component Value Date/Time   WBC 6.3 05/31/2019 0948   RBC 4.34 05/31/2019 0948   HGB 12.7 (L) 05/31/2019 0948   HCT 40.9 05/31/2019 0948   PLT 271 05/31/2019 0948   MCV 94.2 05/31/2019 0948   MCH 29.3 05/31/2019 0948   MCHC 31.1 05/31/2019 0948   RDW 14.4 05/31/2019 0948   LYMPHSABS 1.5 05/31/2019 0948   MONOABS 0.4 05/31/2019 0948   EOSABS 0.3 05/31/2019 0948   BASOSABS 0.0 05/31/2019 0948   CMP Latest Ref Rng & Units 05/31/2019 05/10/2019 05/03/2019  Glucose 70 - 99 mg/dL 100(H) 108(H) 108(H)  BUN 8 - 23 mg/dL 20 12 27(H)  Creatinine 0.61 - 1.24 mg/dL 0.78 0.70 0.96  Sodium 135 - 145 mmol/L 140 140 139  Potassium 3.5 - 5.1 mmol/L 4.4 4.0 5.0  Chloride 98 - 111 mmol/L 106 109 103  CO2 22 - 32 mmol/L 25 23 24   Calcium 8.9 - 10.3 mg/dL 9.3 8.6(L) 9.7  Total Protein 6.5 - 8.1 g/dL 6.9 - 7.3  Total Bilirubin 0.3 - 1.2 mg/dL 0.7 - 0.9  Alkaline Phos 38 - 126 U/L 38 - 47  AST 15 - 41 U/L 12(L) - 13(L)  ALT 0 - 44 U/L 11 - 13       DIAGNOSTIC IMAGING:  I have independently reviewed the scans and discussed with the patient.   I have reviewed Venita Lick LPN's note and agree with the documentation.  I personally performed a face-to-face visit, made revisions and my assessment and plan is as follows.    ASSESSMENT & PLAN:   Prostate cancer ([place]) 1.  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 - Fluciclovine PET CT scan showed lymphadenopathy in the chest and sclerotic lesion in the right ilium, diffuse enhancement of the prostate. - Enzalutamide was started on 02/08/2018 at 80 mg daily, increased to 4  tablets daily in the first week of June 2019. - He received last Lupron injection at Dr. Jethro Poling office in mid May 2020. - He reportedly had hematuria from suprapubic catheter and went to the ER on 05/10/2019. - As he was having pain at the catheter site, he was told to cut back on enzalutamide to 80 mg daily.  However his pain in the suprapubic region as well as left hip did not improve. - Last PSA on 05/03/2019 was 0.25.  This is slightly gone up from 0.17.  As it did not improve his pains, I have told him to go back on full dose enzalutamide. -I will see him back in 6 to 8 weeks for follow-up and repeat labs.  2.  Bone metastasis: -He had 1 metastatic site on fluciclovine PET CT scan on the right ilium. -Denosumab  was started on 05/02/2018 which she is tolerating it very well. -He will continue calcium twice daily.  3.  Paroxysmal atrial fibrillation: - He is continuing to be on Eliquis.  Total time spent is 25 minutes with more than 50% of the time spent face-to-face discussing disease status, treatment plan and coordination of care.    Orders placed this encounter:  Orders Placed This Encounter  Procedures  . CBC with Differential/Platelet  . Comprehensive metabolic panel  . PSA      Derek Jack, MD [place] [phone]

## 2019-06-02 ENCOUNTER — Ambulatory Visit (INDEPENDENT_AMBULATORY_CARE_PROVIDER_SITE_OTHER): Payer: Medicare HMO | Admitting: Urology

## 2019-06-02 ENCOUNTER — Other Ambulatory Visit: Payer: Self-pay

## 2019-06-02 DIAGNOSIS — R338 Other retention of urine: Secondary | ICD-10-CM

## 2019-06-12 DIAGNOSIS — Z299 Encounter for prophylactic measures, unspecified: Secondary | ICD-10-CM | POA: Diagnosis not present

## 2019-06-12 DIAGNOSIS — M549 Dorsalgia, unspecified: Secondary | ICD-10-CM | POA: Diagnosis not present

## 2019-06-12 DIAGNOSIS — C61 Malignant neoplasm of prostate: Secondary | ICD-10-CM | POA: Diagnosis not present

## 2019-06-12 DIAGNOSIS — I10 Essential (primary) hypertension: Secondary | ICD-10-CM | POA: Diagnosis not present

## 2019-06-12 DIAGNOSIS — Z9359 Other cystostomy status: Secondary | ICD-10-CM | POA: Diagnosis not present

## 2019-06-12 DIAGNOSIS — Z6828 Body mass index (BMI) 28.0-28.9, adult: Secondary | ICD-10-CM | POA: Diagnosis not present

## 2019-06-13 DIAGNOSIS — M159 Polyosteoarthritis, unspecified: Secondary | ICD-10-CM | POA: Diagnosis not present

## 2019-06-13 DIAGNOSIS — I10 Essential (primary) hypertension: Secondary | ICD-10-CM | POA: Diagnosis not present

## 2019-06-13 DIAGNOSIS — I2510 Atherosclerotic heart disease of native coronary artery without angina pectoris: Secondary | ICD-10-CM | POA: Diagnosis not present

## 2019-06-14 MED FILL — XTANDI 40 MG CAPSULE: 40 | 30 days supply | Qty: 120 | Fill #2

## 2019-06-16 ENCOUNTER — Other Ambulatory Visit: Payer: Self-pay

## 2019-06-16 ENCOUNTER — Ambulatory Visit (INDEPENDENT_AMBULATORY_CARE_PROVIDER_SITE_OTHER): Payer: Medicare HMO | Admitting: Urology

## 2019-06-16 DIAGNOSIS — R338 Other retention of urine: Secondary | ICD-10-CM | POA: Diagnosis not present

## 2019-06-23 ENCOUNTER — Other Ambulatory Visit: Payer: Self-pay | Admitting: Cardiology

## 2019-06-26 ENCOUNTER — Other Ambulatory Visit: Payer: Self-pay

## 2019-06-26 MED ORDER — ELIQUIS 5 MG PO TABS: 5.0000 mg | ORAL_TABLET | Freq: Two times a day (BID) | ORAL | 9 refills | Status: DC

## 2019-06-29 ENCOUNTER — Inpatient Hospital Stay (HOSPITAL_COMMUNITY): Payer: Medicare HMO

## 2019-06-29 ENCOUNTER — Other Ambulatory Visit: Payer: Self-pay

## 2019-06-29 ENCOUNTER — Inpatient Hospital Stay (HOSPITAL_COMMUNITY): Payer: Medicare HMO | Attending: Hematology

## 2019-06-29 ENCOUNTER — Inpatient Hospital Stay (HOSPITAL_COMMUNITY): Payer: Medicare HMO | Admitting: Hematology

## 2019-06-29 VITALS — BP 148/71 | HR 64 | Temp 97.8°F | Resp 16 | Wt 186.9 lb

## 2019-06-29 DIAGNOSIS — F3289 Other specified depressive episodes: Secondary | ICD-10-CM | POA: Insufficient documentation

## 2019-06-29 DIAGNOSIS — Z79899 Other long term (current) drug therapy: Secondary | ICD-10-CM | POA: Diagnosis not present

## 2019-06-29 DIAGNOSIS — C61 Malignant neoplasm of prostate: Secondary | ICD-10-CM

## 2019-06-29 DIAGNOSIS — I480 Paroxysmal atrial fibrillation: Secondary | ICD-10-CM | POA: Insufficient documentation

## 2019-06-29 DIAGNOSIS — C7951 Secondary malignant neoplasm of bone: Secondary | ICD-10-CM

## 2019-06-29 DIAGNOSIS — Z87891 Personal history of nicotine dependence: Secondary | ICD-10-CM | POA: Insufficient documentation

## 2019-06-29 DIAGNOSIS — E669 Obesity, unspecified: Secondary | ICD-10-CM | POA: Insufficient documentation

## 2019-06-29 DIAGNOSIS — M2550 Pain in unspecified joint: Secondary | ICD-10-CM | POA: Insufficient documentation

## 2019-06-29 DIAGNOSIS — R0602 Shortness of breath: Secondary | ICD-10-CM | POA: Insufficient documentation

## 2019-06-29 DIAGNOSIS — Z8249 Family history of ischemic heart disease and other diseases of the circulatory system: Secondary | ICD-10-CM | POA: Insufficient documentation

## 2019-06-29 DIAGNOSIS — M549 Dorsalgia, unspecified: Secondary | ICD-10-CM | POA: Insufficient documentation

## 2019-06-29 DIAGNOSIS — Z8673 Personal history of transient ischemic attack (TIA), and cerebral infarction without residual deficits: Secondary | ICD-10-CM | POA: Diagnosis not present

## 2019-06-29 DIAGNOSIS — Z923 Personal history of irradiation: Secondary | ICD-10-CM | POA: Diagnosis not present

## 2019-06-29 DIAGNOSIS — G8929 Other chronic pain: Secondary | ICD-10-CM | POA: Insufficient documentation

## 2019-06-29 DIAGNOSIS — R531 Weakness: Secondary | ICD-10-CM | POA: Insufficient documentation

## 2019-06-29 DIAGNOSIS — I252 Old myocardial infarction: Secondary | ICD-10-CM | POA: Diagnosis not present

## 2019-06-29 DIAGNOSIS — Z7901 Long term (current) use of anticoagulants: Secondary | ICD-10-CM | POA: Insufficient documentation

## 2019-06-29 DIAGNOSIS — R5383 Other fatigue: Secondary | ICD-10-CM | POA: Insufficient documentation

## 2019-06-29 DIAGNOSIS — E782 Mixed hyperlipidemia: Secondary | ICD-10-CM | POA: Insufficient documentation

## 2019-06-29 DIAGNOSIS — Z85828 Personal history of other malignant neoplasm of skin: Secondary | ICD-10-CM | POA: Diagnosis not present

## 2019-06-29 DIAGNOSIS — G629 Polyneuropathy, unspecified: Secondary | ICD-10-CM | POA: Diagnosis not present

## 2019-06-29 DIAGNOSIS — M791 Myalgia, unspecified site: Secondary | ICD-10-CM | POA: Insufficient documentation

## 2019-06-29 DIAGNOSIS — M25552 Pain in left hip: Secondary | ICD-10-CM | POA: Diagnosis not present

## 2019-06-29 LAB — CBC WITH DIFFERENTIAL/PLATELET
Abs Immature Granulocytes: 0.03 10*3/uL (ref 0.00–0.07)
Basophils Absolute: 0 10*3/uL (ref 0.0–0.1)
Basophils Relative: 1 %
Eosinophils Absolute: 0.3 10*3/uL (ref 0.0–0.5)
Eosinophils Relative: 3 %
HCT: 43.3 % (ref 39.0–52.0)
Hemoglobin: 13.2 g/dL (ref 13.0–17.0)
Immature Granulocytes: 0 %
Lymphocytes Relative: 19 %
Lymphs Abs: 1.5 10*3/uL (ref 0.7–4.0)
MCH: 28.6 pg (ref 26.0–34.0)
MCHC: 30.5 g/dL (ref 30.0–36.0)
MCV: 93.9 fL (ref 80.0–100.0)
Monocytes Absolute: 0.5 10*3/uL (ref 0.1–1.0)
Monocytes Relative: 6 %
Neutro Abs: 5.7 10*3/uL (ref 1.7–7.7)
Neutrophils Relative %: 71 %
Platelets: 254 10*3/uL (ref 150–400)
RBC: 4.61 MIL/uL (ref 4.22–5.81)
RDW: 14.5 % (ref 11.5–15.5)
WBC: 7.9 10*3/uL (ref 4.0–10.5)
nRBC: 0 % (ref 0.0–0.2)

## 2019-06-29 LAB — COMPREHENSIVE METABOLIC PANEL
ALT: 13 U/L (ref 0–44)
AST: 12 U/L — ABNORMAL LOW (ref 15–41)
Albumin: 3.8 g/dL (ref 3.5–5.0)
Alkaline Phosphatase: 40 U/L (ref 38–126)
Anion gap: 7 (ref 5–15)
BUN: 20 mg/dL (ref 8–23)
CO2: 24 mmol/L (ref 22–32)
Calcium: 8.7 mg/dL — ABNORMAL LOW (ref 8.9–10.3)
Chloride: 108 mmol/L (ref 98–111)
Creatinine, Ser: 0.84 mg/dL (ref 0.61–1.24)
GFR calc Af Amer: >60 mL/min (ref >60)
GFR calc non Af Amer: >60 mL/min (ref >60)
Glucose, Bld: 108 mg/dL — ABNORMAL HIGH (ref 70–99)
Potassium: 4.7 mmol/L (ref 3.5–5.1)
Sodium: 139 mmol/L (ref 135–145)
Total Bilirubin: 0.8 mg/dL (ref 0.3–1.2)
Total Protein: 6.9 g/dL (ref 6.5–8.1)

## 2019-06-29 LAB — PSA: Prostatic Specific Antigen: 0.19 ng/mL (ref 0.00–4.00)

## 2019-06-29 MED ORDER — DENOSUMAB 120 MG/1.7ML ~~LOC~~ SOLN
SUBCUTANEOUS | Status: AC
  Filled 2019-06-29: qty 1.7

## 2019-06-29 MED ORDER — DENOSUMAB 120 MG/1.7ML ~~LOC~~ SOLN
120.0000 mg | Freq: Once | SUBCUTANEOUS | Status: AC
  Administered 2019-06-29: 120 mg via SUBCUTANEOUS

## 2019-06-29 NOTE — Progress Notes (Signed)
Vaughan Basta presents today for injection per MD orders. Xgeva 120 mg administered SQ in right Upper Arm. Administration without incident. Patient tolerated well.

## 2019-06-29 NOTE — Assessment & Plan Note (Signed)
1.  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 - Fluciclovine PET CT scan showed lymphadenopathy in the chest and sclerotic lesion in the right ilium, diffuse enhancement of the prostate. - Enzalutamide was started on 02/08/2018 at 80 mg daily, increased to 4 tablets daily in the first week of June 2019. - He received last Lupron injection at Dr. Jethro Poling office in mid May 2020. - He reportedly had hematuria from suprapubic catheter and went to the ER on 05/10/2019. - As he was having pain at the catheter site, he was told to cut back on enzalutamide to 80 mg daily.  However his pain in the suprapubic region as well as left hip did not improve. -05/31/2019: Enzalutamide increased to 160mg , hip pain did not improve on a lower dose.  - Labs remain stable.  PSA 0.19.  I recommend patient continue on Xtandi 160 mg daily. -He return to clinic in 8 weeks with repeat labs and office visit.  2.  Bone metastasis: -He had 1 metastatic site on fluciclovine PET CT scan on the right ilium. -Denosumab was started on 05/02/2018 which he is tolerating it very well. -He will continue calcium twice daily.  3.  Paroxysmal atrial fibrillation: - He is continuing to be on Eliquis.

## 2019-06-29 NOTE — Progress Notes (Signed)
Elijah Santana, [place] [address]   CLINIC:  Medical Oncology/Hematology  PCP:  Glenda Chroman, [place] [id number] [phone]   REASON FOR VISIT:  Follow-up for Prostate Cancer  CURRENT THERAPY: Enzalutamide.  BRIEF ONCOLOGIC HISTORY:  Oncology History  Prostate cancer ([place])  11/27/2017 Initial Diagnosis   Prostate cancer [facility])       INTERVAL HISTORY:  Elijah Santana 85 y.o. male presents today for follow up. He reports overall doing fair. He is currently on enzalutamide, tolerating well. He reports shortness of breath and fatigue with exertion that resolves with rest. He went to the ER over the weekend due to dislodgement of suprapubic catheter.  The catheter was reinserted and he has had no more problems.  Reports appetite is stable.  No change in bowel habits.  Peripheral neuropathy is unchanged.  He reports depression secondary to being a widow and none of his family living close by.   REVIEW OF SYSTEMS:  Review of Systems  Constitutional: Positive for fatigue.  HENT:  Negative.   Eyes: Negative.   Respiratory: Negative.   Cardiovascular: Negative.   Gastrointestinal: Negative.   Endocrine: Negative.   Genitourinary: Negative.    Musculoskeletal: Positive for arthralgias, back pain and myalgias.  Skin: Negative.   Neurological: Positive for extremity weakness.  Hematological: Negative.   Psychiatric/Behavioral: Negative.      PAST MEDICAL/SURGICAL HISTORY:  Past Medical History:  Diagnosis Date  . Arthritis   . Asthma    Childhood  . Chronic back pain   . Coronary atherosclerosis of native coronary artery    a. CABG 2004 with LIMA-LAD, SVG-D1, SVG-OM, SVG-PDA. b. Cath ~2010 with occ of SVG-diagonal, distal LAD and OM filiing by collaterals. c. NSTEMI 12/2014 s/p DES to SVG-RCA. d. 11/2015: DES to distal graft of the SVG-distal RCA  . Essential hypertension   . Foley catheter in place   . GERD (gastroesophageal  reflux disease)   . Hard of hearing   . Hyperglycemia   . Ischemic cardiomyopathy    a. EF 45% by cath 12/2014.  . Mixed hyperlipidemia   . NSTEMI (non-ST elevated myocardial infarction) ([place]) 01/01/15  . Paroxysmal atrial fibrillation ([facility])   . Pneumonia    hx of   . Prostate cancer [facility])    Radiation therapy 1996  . Skin cancer   . Stroke [facility])    TIA- 28 years ago    Past Surgical History:  Procedure Laterality Date  . CARDIAC CATHETERIZATION  01/01/2015   Procedure: CORONARY STENT INTERVENTION;  Surgeon: Peter M Martinique, MD;  Location: [facility] CATH LAB;  Service: Cardiovascular;;  SVG to PDA  . CARDIAC CATHETERIZATION N/A 11/21/2015   Procedure: Left Heart Cath and Cors/Grafts Angiography;  Surgeon: Troy Sine, MD;  Location: [place] CV LAB;  Service: Cardiovascular;  Laterality: N/A;  . CARDIAC CATHETERIZATION N/A 11/21/2015   Procedure: Coronary Stent Intervention;  Surgeon: Troy Sine, MD;  Location: [place] CV LAB;  Service: Cardiovascular;  Laterality: N/A;  . CARDIAC CATHETERIZATION N/A 09/11/2016   Procedure: Left Heart Cath and Cors/Grafts Angiography;  Surgeon: Leonie Man, MD;  Location: [place] CV LAB;  Service: Cardiovascular;  Laterality: N/A;  . CARDIAC CATHETERIZATION N/A 09/11/2016   Procedure: Coronary Stent Intervention;  Surgeon: Leonie Man, MD;  Location: [place] CV LAB;  Service: Cardiovascular;  Laterality: N/A;  . CORONARY ARTERY BYPASS GRAFT  2004   LIMA to  LAD, SVG to diagonal, SVG to circumflex, SVG to PDA  . CYSTOSCOPY WITH URETHRAL DILATATION N/A 03/28/2019   Procedure: CYSTOSCOPY WITH URETHRAL  DILATATION OF STRICTURE;  Surgeon: Irine Seal, MD;  Location: [facility] ORS;  Service: Urology;  Laterality: N/A;  . Santana LIGHT LASER TURP (TRANSURETHRAL RESECTION OF PROSTATE N/A 06/04/2016   Procedure: Santana LIGHT LASER TURP (TRANSURETHRAL RESECTION OF PROSTATE;  Surgeon: Irine Seal, MD;  Location: [facility] ORS;  Service: Urology;  Laterality: N/A;  .  LEFT HEART CATHETERIZATION WITH CORONARY/GRAFT ANGIOGRAM N/A 01/01/2015   Procedure: LEFT HEART CATHETERIZATION WITH Beatrix Fetters;  Surgeon: Peter M Martinique, MD;  Location: [facility] CATH LAB;  Service: Cardiovascular;  Laterality: N/A;  . PERCUTANEOUS CORONARY STENT INTERVENTION (PCI-S)  11/20/2015   distal SVG  with DES       . TONSILLECTOMY       SOCIAL HISTORY:  Social History   Socioeconomic History  . Marital status: Married    Spouse name: Not on file  . Number of children: Not on file  . Years of education: Not on file  . Highest education level: Not on file  Occupational History  . Occupation: Retired    Comment: Office manager  Social Needs  . Financial resource strain: Not on file  . Food insecurity    Worry: Not on file    Inability: Not on file  . Transportation needs    Medical: Not on file    Non-medical: Not on file  Tobacco Use  . Smoking status: Former Smoker    Packs/day: 0.50    Years: 27.00    Pack years: 13.50    Types: Cigars    Start date: 07/28/1964    Quit date: 07/28/1986    Years since quitting: 32.9  . Smokeless tobacco: Former Systems developer    Types: Chew    Quit date: 08/07/2010  . Tobacco comment: never chewed up over a pack/day  Substance and Sexual Activity  . Alcohol use: No    Alcohol/week: 0.0 standard drinks  . Drug use: No  . Sexual activity: Not on file  Lifestyle  . Physical activity    Days per week: Not on file    Minutes per session: Not on file  . Stress: Not on file  Relationships  . Social Herbalist on phone: Not on file    Gets together: Not on file    Attends religious service: Not on file    Active member of club or organization: Not on file    Attends meetings of clubs or organizations: Not on file    Relationship status: Not on file  . Intimate partner violence    Fear of current or ex partner: Not on file    Emotionally abused: Not on file    Physically abused: Not on file    Forced sexual  activity: Not on file  Other Topics Concern  . Not on file  Social History Narrative  . Not on file    FAMILY HISTORY:  Family History  Problem Relation Age of Onset  . Hypertension Father   . Coronary artery disease Father     CURRENT MEDICATIONS:  Outpatient Encounter Medications as of 06/29/2019  Medication Sig Note  . acetaminophen (TYLENOL) 325 MG tablet Take 2 tablets (650 mg total) by mouth every 4 (four) hours as needed for headache or mild pain.   Marland Kitchen amLODipine (NORVASC) 5 MG tablet TAKE ONE TABLET BY MOUTH DAILY (MORNING) (Patient taking  differently: Take 5 mg by mouth every morning. )   . bisacodyl (DULCOLAX) 5 MG EC tablet Take 5-10 mg by mouth daily as needed (constipation). Reported on 05/28/2016   . calcium carbonate (OS-CAL - DOSED IN MG OF ELEMENTAL CALCIUM) 1250 (500 Ca) MG tablet Take 1 tablet by mouth daily.    . clopidogrel (PLAVIX) 75 MG tablet TAKE ONE TABLET BY MOUTH EVERY DAY (,EVENING)   . denosumab (XGEVA) 120 MG/1.7ML SOLN injection Inject 120 mg into the skin once.   Marland Kitchen ELIQUIS 5 MG TABS tablet Take 1 tablet (5 mg total) by mouth 2 (two) times daily.   . famotidine (PEPCID) 40 MG tablet    . hyoscyamine (LEVSIN SL) 0.125 MG SL tablet Place 1 tablet (0.125 mg total) under the tongue every 6 (six) hours as needed for cramping.   . isosorbide mononitrate (IMDUR) 60 MG 24 hr tablet TAKE ONE TABLET BY MOUTH DAILY (MORNING)   . ketoconazole (NIZORAL) 2 % cream Apply 1 application topically 2 (two) times daily. Applied to the bottom of feet   . Leuprolide Acetate (LUPRON IJ) Inject as directed every 4 (four) months.  03/26/2019: Administered by the office of Dr. Jeffie Pollock every 4 months   . linaclotide (LINZESS) 290 MCG CAPS capsule Take 1 capsule (290 mcg total) by mouth daily before breakfast.   . magnesium citrate SOLN Take 1 Bottle by mouth once as needed for mild constipation or moderate constipation.    . metoprolol tartrate (LOPRESSOR) 50 MG tablet Take 1 tablet (50  mg total) by mouth 2 (two) times daily.   . mupirocin ointment (BACTROBAN) 2 % APPLY A SMALL AMOUNT TO THE AFFECTED AREA TWICE DAILY FOR 7 DAYS.   Marland Kitchen nitroGLYCERIN (NITROSTAT) 0.4 MG SL tablet DISSOLVE ONE TABLET UNDER TONGUE EVERY 5 MINUTES UP TO 3 DOSES AS NEEDED FOR CHEST PAIN   . nystatin cream (MYCOSTATIN) APPLY A SMALL AMOUNT AS DIRECTED TWICE DAILY.   Marland Kitchen ondansetron (ZOFRAN ODT) 4 MG disintegrating tablet Take 1 tablet (4 mg total) by mouth every 8 (eight) hours as needed.   . pantoprazole (PROTONIX) 40 MG tablet Take 1 tablet (40 mg total) by mouth daily.   . predniSONE (DELTASONE) 10 MG tablet Take 10 mg by mouth 2 (two) times daily.   . rosuvastatin (CRESTOR) 20 MG tablet Take 1 tablet (20 mg total) by mouth every evening.   Marland Kitchen STOOL SOFTENER 100 MG capsule Take 100-200 mg by mouth daily as needed for mild constipation.    . sulfamethoxazole-trimethoprim (BACTRIM DS) 800-160 MG tablet Take 1 tablet by mouth 2 (two) times daily.   . traMADol (ULTRAM) 50 MG tablet Take 0.5 tablets (25 mg total) by mouth daily as needed.   Gillermina Phy 40 MG capsule TAKE 4 CAPSULES (160 MG TOTAL) BY MOUTH DAILY. (Patient taking differently: Take 80 mg by mouth every morning. )    No facility-administered encounter medications on file as of 06/29/2019.     ALLERGIES:  No Known Allergies   PHYSICAL EXAM:  ECOG Performance status: 2  Vitals:   06/29/19 0900  BP: (!) 148/71  Pulse: 64  Resp: 16  Temp: 97.8 F (36.6 C)  SpO2: 96%   Filed Weights   06/29/19 0900  Weight: 186 lb 14.4 oz (84.8 kg)    Physical Exam Constitutional:      Appearance: Normal appearance. He is obese.  HENT:     Head: Normocephalic.     Right Ear: External ear normal.  Left Ear: External ear normal.     Nose: Nose normal.     Mouth/Throat:     Pharynx: Oropharynx is clear.  Eyes:     Conjunctiva/sclera: Conjunctivae normal.  Neck:     Musculoskeletal: Normal range of motion.  Cardiovascular:     Rate and  Rhythm: Normal rate and regular rhythm.     Pulses: Normal pulses.     Heart sounds: Normal heart sounds.  Pulmonary:     Effort: Pulmonary effort is normal.     Breath sounds: Normal breath sounds.  Abdominal:     General: Bowel sounds are normal.     Palpations: Abdomen is soft.  Musculoskeletal: Normal range of motion.  Skin:    General: Skin is warm and dry.  Neurological:     General: No focal deficit present.     Mental Status: He is alert and oriented to person, place, and time.  Psychiatric:        Mood and Affect: Mood normal.        Behavior: Behavior normal.        Thought Content: Thought content normal.        Judgment: Judgment normal.      LABORATORY DATA:  I have reviewed the labs as listed.  CBC    Component Value Date/Time   WBC 7.9 06/29/2019 0840   RBC 4.61 06/29/2019 0840   HGB 13.2 06/29/2019 0840   HCT 43.3 06/29/2019 0840   PLT 254 06/29/2019 0840   MCV 93.9 06/29/2019 0840   MCH 28.6 06/29/2019 0840   MCHC 30.5 06/29/2019 0840   RDW 14.5 06/29/2019 0840   LYMPHSABS 1.5 06/29/2019 0840   MONOABS 0.5 06/29/2019 0840   EOSABS 0.3 06/29/2019 0840   BASOSABS 0.0 06/29/2019 0840   CMP Latest Ref Rng & Units 06/29/2019 05/31/2019 05/10/2019  Glucose 70 - 99 mg/dL 108(H) 100(H) 108(H)  BUN 8 - 23 mg/dL 20 20 12   Creatinine 0.61 - 1.24 mg/dL 0.84 0.78 0.70  Sodium 135 - 145 mmol/L 139 140 140  Potassium 3.5 - 5.1 mmol/L 4.7 4.4 4.0  Chloride 98 - 111 mmol/L 108 106 109  CO2 22 - 32 mmol/L 24 25 23   Calcium 8.9 - 10.3 mg/dL 8.7(L) 9.3 8.6(L)  Total Protein 6.5 - 8.1 g/dL 6.9 6.9 -  Total Bilirubin 0.3 - 1.2 mg/dL 0.8 0.7 -  Alkaline Phos 38 - 126 U/L 40 38 -  AST 15 - 41 U/L 12(L) 12(L) -  ALT 0 - 44 U/L 13 11 -          ASSESSMENT & PLAN:   Prostate cancer ([place]) 1.  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 - Fluciclovine PET CT scan showed lymphadenopathy in the chest and sclerotic lesion in the right ilium, diffuse enhancement of the prostate. - Enzalutamide was started on 02/08/2018 at 80 mg daily, increased to 4 tablets daily in the first week of June 2019. - He received last Lupron injection at Dr. Jethro Poling office in mid May 2020. - He reportedly had hematuria from suprapubic catheter and went to the ER on 05/10/2019. - As he was having pain at the catheter site, he was told to cut back on enzalutamide to 80 mg daily.  However his pain in the suprapubic region as well as left  hip did not improve. -05/31/2019: Enzalutamide increased to 160mg , hip pain did not improve on a lower dose.  - Labs remain stable.  PSA 0.19.  I recommend patient continue on Xtandi 160 mg daily. -He return to clinic in 8 weeks with repeat labs and office visit.  2.  Bone metastasis: -He had 1 metastatic site on fluciclovine PET CT scan on the right ilium. -Denosumab was started on 05/02/2018 which he is tolerating it very well. -He will continue calcium twice daily.  3.  Paroxysmal atrial fibrillation: - He is continuing to be on Eliquis.      Orders placed this encounter:  Orders Placed This Encounter  Procedures  . CBC with Differential  . Comprehensive metabolic panel  . [place], [place] [phone]

## 2019-07-03 ENCOUNTER — Telehealth (HOSPITAL_COMMUNITY): Payer: Self-pay | Admitting: Surgery

## 2019-07-03 NOTE — Telephone Encounter (Signed)
Pt called concerned that he had been seeing the nurse practitioner instead of [name].  Amy got the pt rescheduled for Oct 8 with [name], and I went through all the pt's upcoming appointments. Pt verbalized understanding of appointments and was told to call back if he had any concerns.

## 2019-07-04 DIAGNOSIS — I4891 Unspecified atrial fibrillation: Secondary | ICD-10-CM | POA: Diagnosis not present

## 2019-07-04 DIAGNOSIS — Z299 Encounter for prophylactic measures, unspecified: Secondary | ICD-10-CM | POA: Diagnosis not present

## 2019-07-04 DIAGNOSIS — M549 Dorsalgia, unspecified: Secondary | ICD-10-CM | POA: Diagnosis not present

## 2019-07-04 DIAGNOSIS — Z6828 Body mass index (BMI) 28.0-28.9, adult: Secondary | ICD-10-CM | POA: Diagnosis not present

## 2019-07-04 DIAGNOSIS — I10 Essential (primary) hypertension: Secondary | ICD-10-CM | POA: Diagnosis not present

## 2019-07-07 DIAGNOSIS — R338 Other retention of urine: Secondary | ICD-10-CM | POA: Diagnosis not present

## 2019-07-07 DIAGNOSIS — C61 Malignant neoplasm of prostate: Secondary | ICD-10-CM | POA: Diagnosis not present

## 2019-07-10 DIAGNOSIS — C779 Secondary and unspecified malignant neoplasm of lymph node, unspecified: Secondary | ICD-10-CM | POA: Diagnosis not present

## 2019-07-10 DIAGNOSIS — M159 Polyosteoarthritis, unspecified: Secondary | ICD-10-CM | POA: Diagnosis not present

## 2019-07-10 DIAGNOSIS — M48061 Spinal stenosis, lumbar region without neurogenic claudication: Secondary | ICD-10-CM | POA: Diagnosis not present

## 2019-07-10 DIAGNOSIS — I2510 Atherosclerotic heart disease of native coronary artery without angina pectoris: Secondary | ICD-10-CM | POA: Diagnosis not present

## 2019-07-10 DIAGNOSIS — M545 Low back pain: Secondary | ICD-10-CM | POA: Diagnosis not present

## 2019-07-10 DIAGNOSIS — M47816 Spondylosis without myelopathy or radiculopathy, lumbar region: Secondary | ICD-10-CM | POA: Diagnosis not present

## 2019-07-10 DIAGNOSIS — C7951 Secondary malignant neoplasm of bone: Secondary | ICD-10-CM | POA: Diagnosis not present

## 2019-07-10 DIAGNOSIS — Z8546 Personal history of malignant neoplasm of prostate: Secondary | ICD-10-CM | POA: Diagnosis not present

## 2019-07-10 DIAGNOSIS — I10 Essential (primary) hypertension: Secondary | ICD-10-CM | POA: Diagnosis not present

## 2019-07-13 DIAGNOSIS — Z299 Encounter for prophylactic measures, unspecified: Secondary | ICD-10-CM | POA: Diagnosis not present

## 2019-07-13 DIAGNOSIS — Z713 Dietary counseling and surveillance: Secondary | ICD-10-CM | POA: Diagnosis not present

## 2019-07-13 DIAGNOSIS — I4891 Unspecified atrial fibrillation: Secondary | ICD-10-CM | POA: Diagnosis not present

## 2019-07-13 DIAGNOSIS — G893 Neoplasm related pain (acute) (chronic): Secondary | ICD-10-CM | POA: Diagnosis not present

## 2019-07-13 DIAGNOSIS — Z6828 Body mass index (BMI) 28.0-28.9, adult: Secondary | ICD-10-CM | POA: Diagnosis not present

## 2019-07-13 MED FILL — XTANDI 40 MG CAPSULE: 40 | 30 days supply | Qty: 120 | Fill #3

## 2019-07-21 ENCOUNTER — Other Ambulatory Visit: Payer: Self-pay

## 2019-07-21 ENCOUNTER — Ambulatory Visit: Payer: Medicare HMO | Admitting: Urology

## 2019-07-21 ENCOUNTER — Ambulatory Visit (INDEPENDENT_AMBULATORY_CARE_PROVIDER_SITE_OTHER): Payer: Medicare HMO | Admitting: Urology

## 2019-07-21 DIAGNOSIS — C61 Malignant neoplasm of prostate: Secondary | ICD-10-CM | POA: Diagnosis not present

## 2019-07-21 DIAGNOSIS — R338 Other retention of urine: Secondary | ICD-10-CM

## 2019-07-27 ENCOUNTER — Inpatient Hospital Stay (HOSPITAL_COMMUNITY): Payer: Medicare HMO | Attending: Hematology

## 2019-07-27 ENCOUNTER — Inpatient Hospital Stay (HOSPITAL_COMMUNITY): Payer: Medicare HMO

## 2019-07-27 ENCOUNTER — Other Ambulatory Visit: Payer: Self-pay

## 2019-07-27 VITALS — BP 143/87 | HR 62 | Temp 97.9°F

## 2019-07-27 DIAGNOSIS — C61 Malignant neoplasm of prostate: Secondary | ICD-10-CM | POA: Diagnosis not present

## 2019-07-27 DIAGNOSIS — Z8249 Family history of ischemic heart disease and other diseases of the circulatory system: Secondary | ICD-10-CM | POA: Diagnosis not present

## 2019-07-27 DIAGNOSIS — R531 Weakness: Secondary | ICD-10-CM | POA: Insufficient documentation

## 2019-07-27 DIAGNOSIS — M2550 Pain in unspecified joint: Secondary | ICD-10-CM | POA: Insufficient documentation

## 2019-07-27 DIAGNOSIS — C7951 Secondary malignant neoplasm of bone: Secondary | ICD-10-CM | POA: Insufficient documentation

## 2019-07-27 DIAGNOSIS — Z79899 Other long term (current) drug therapy: Secondary | ICD-10-CM | POA: Diagnosis not present

## 2019-07-27 DIAGNOSIS — I480 Paroxysmal atrial fibrillation: Secondary | ICD-10-CM | POA: Insufficient documentation

## 2019-07-27 DIAGNOSIS — M791 Myalgia, unspecified site: Secondary | ICD-10-CM | POA: Diagnosis not present

## 2019-07-27 DIAGNOSIS — M549 Dorsalgia, unspecified: Secondary | ICD-10-CM | POA: Diagnosis not present

## 2019-07-27 DIAGNOSIS — R5383 Other fatigue: Secondary | ICD-10-CM | POA: Diagnosis not present

## 2019-07-27 DIAGNOSIS — C778 Secondary and unspecified malignant neoplasm of lymph nodes of multiple regions: Secondary | ICD-10-CM | POA: Insufficient documentation

## 2019-07-27 DIAGNOSIS — Z87891 Personal history of nicotine dependence: Secondary | ICD-10-CM | POA: Insufficient documentation

## 2019-07-27 LAB — COMPREHENSIVE METABOLIC PANEL
ALT: 10 U/L (ref 0–44)
AST: 12 U/L — ABNORMAL LOW (ref 15–41)
Albumin: 3.6 g/dL (ref 3.5–5.0)
Alkaline Phosphatase: 42 U/L (ref 38–126)
Anion gap: 6 (ref 5–15)
BUN: 17 mg/dL (ref 8–23)
CO2: 26 mmol/L (ref 22–32)
Calcium: 8.9 mg/dL (ref 8.9–10.3)
Chloride: 106 mmol/L (ref 98–111)
Creatinine, Ser: 0.7 mg/dL (ref 0.61–1.24)
GFR calc Af Amer: >60 mL/min (ref >60)
GFR calc non Af Amer: >60 mL/min (ref >60)
Glucose, Bld: 116 mg/dL — ABNORMAL HIGH (ref 70–99)
Potassium: 4.4 mmol/L (ref 3.5–5.1)
Sodium: 138 mmol/L (ref 135–145)
Total Bilirubin: 0.8 mg/dL (ref 0.3–1.2)
Total Protein: 6.7 g/dL (ref 6.5–8.1)

## 2019-07-27 MED ORDER — DENOSUMAB 120 MG/1.7ML ~~LOC~~ SOLN
SUBCUTANEOUS | Status: AC
  Filled 2019-07-27: qty 1.7

## 2019-07-27 MED ORDER — DENOSUMAB 120 MG/1.7ML ~~LOC~~ SOLN
120.0000 mg | Freq: Once | SUBCUTANEOUS | Status: AC
  Administered 2019-07-27: 120 mg via SUBCUTANEOUS

## 2019-07-27 NOTE — Patient Instructions (Signed)
[facility]  Discharge Instructions:   _______________________________________________________________  Thank you for choosing [facility] to provide your oncology and hematology care.  To afford each patient quality time with our providers, please arrive at least 15 minutes before your scheduled appointment.  You need to re-schedule your appointment if you arrive 10 or more minutes late.  We strive to give you quality time with our providers, and arriving late affects you and other patients whose appointments are after yours.  Also, if you no show three or more times for appointments you may be dismissed from the clinic.  Again, thank you for choosing [facility]. Our hope is that these requests will allow you access to exceptional care and in a timely manner. _______________________________________________________________  If you have questions after your visit, please contact our office at [phone] between the hours of 8:30 a.m. and 5:00 p.m. Voicemails left after 4:30 p.m. will not be returned until the following business day. _______________________________________________________________  For prescription refill requests, have your pharmacy contact our office. _______________________________________________________________  Recommendations made by the consultant and any test results will be sent to your referring physician. _______________________________________________________________ 

## 2019-07-27 NOTE — Progress Notes (Signed)
Elijah Santana presents today for injection per MD orders. XGeva administered SQ in right Upper Arm. Administration without incident. Patient tolerated well.   Vital signs stable. No complaints at this time. Discharged from clinic ambulatory. F/U with [facility] as scheduled.

## 2019-08-04 ENCOUNTER — Other Ambulatory Visit: Payer: Self-pay

## 2019-08-04 ENCOUNTER — Ambulatory Visit (INDEPENDENT_AMBULATORY_CARE_PROVIDER_SITE_OTHER): Payer: Medicare HMO | Admitting: Urology

## 2019-08-04 DIAGNOSIS — R338 Other retention of urine: Secondary | ICD-10-CM

## 2019-08-06 NOTE — Progress Notes (Signed)
Subjective:    Patient ID: Elijah Santana, male    DOB: 09-24-1932, 85 y.o.   MRN: [id number]  HPI Elijah Santana. 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 He was started on Enzalutamide 02/08/2018. Denosumab was started on 05/02/2018. He is followed by oncologist Dr. Derek Jack.   He presents today with complaints of having constipation for the past 2 years. He is taking Linzess 243mcg one capsule daily 30 minutes before breakfast approximately 3 to 4 days weekly. He then passes soft to mud like stool, large amount, dark brown not black, no red blood. He complains of having lower abdominal pain for the past few months. He takes Tylenol 500mg  2 tabs 4 times daily. He also complains of having rectal pain which is constant for the past 2 months, no change in rectal pain after defecation. Sometimes he has rectal pain when sitting in the chair. The rectal pain does not awaken him from sleep. No rectal bleeding or melena. He developed a urethral stricture and a suprapubic catheter was placed 03/24/2019. He accidentally pulled out his suprapubic catheter which was replaced at [facility] on 05/25/2019.  CBC Latest Ref Rng & Units 06/29/2019 05/31/2019 05/10/2019  WBC 4.0 - 10.5 K/uL 7.9 6.3 8.3  Hemoglobin 13.0 - 17.0 g/dL 13.2 12.7(L) 12.2(L)  Hematocrit 39.0 - 52.0 % 43.3 40.9 38.8(L)  Platelets 150 - 400 K/uL 254 271 254   CMP Latest Ref Rng & Units 07/27/2019 06/29/2019 05/31/2019  Glucose 70 - 99 mg/dL 116(H) 108(H) 100(H)  BUN 8 - 23 mg/dL 17 20 20   Creatinine 0.61 - 1.24 mg/dL 0.70 0.84 0.78  Sodium 135 - 145 mmol/L 138 139 140  Potassium 3.5 - 5.1 mmol/L 4.4  4.7 4.4  Chloride 98 - 111 mmol/L 106 108 106  CO2 22 - 32 mmol/L 26 24 25   Calcium 8.9 - 10.3 mg/dL 8.9 8.7(L) 9.3  Total Protein 6.5 - 8.1 g/dL 6.7 6.9 6.9  Total Bilirubin 0.3 - 1.2 mg/dL 0.8 0.8 0.7  Alkaline Phos 38 - 126 U/L 42 40 38  AST 15 - 41 U/L 12(L) 12(L) 12(L)  ALT 0 - 44 U/L 10 13 11    Abd/pelvic CT with contrast 03/23/2019:  Severe bilateral hydronephrosis. Urinary bladder is distended and severely trabeculated. 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 Aortic atherosclerosis.   PET scan 12/07/2017:  1. Intense radiotracer activity within the entirety of the prostate gland is most consistent with prostate cancer recurrence. 2. Intense radiotracer activity associated with small bilateral hilar lymph nodes and lymph node along the descending thoracic aorta with the largest lymph node in the RIGHT hilum. For the findings most concerning for prostate cancer metastatic lesion (although no intervening pelvic or abdominal nodes identified. 3. Intense radiotracer activity associated with the RIGHT iliac bone sclerotic lesion consistent with skeletal metastasis. No clear evidence of additional skeletal lesions identified.  Flexible sigmoidoscopy 03/13/1999 by Dr. Cathlean Cower: normal colon to 40cm  Past Medical History:  Diagnosis Date  . Arthritis   . Asthma    Childhood  . Chronic back pain   .  Coronary atherosclerosis of native coronary artery    a. CABG 2004 with LIMA-LAD, SVG-D1, SVG-OM, SVG-PDA. b. Cath ~2010 with occ of SVG-diagonal, distal LAD and OM filiing by collaterals. c. NSTEMI 12/2014 s/p DES to SVG-RCA. d. 11/2015: DES to distal graft of the SVG-distal RCA  . Essential hypertension   . Foley catheter in place   . GERD (gastroesophageal reflux disease)   . Hard of hearing   . Hyperglycemia   . Ischemic cardiomyopathy    a. EF 45% by cath 12/2014.  . Mixed hyperlipidemia    . NSTEMI (non-ST elevated myocardial infarction) ([place]) 01/01/15  . Paroxysmal atrial fibrillation (HCC)   . Pneumonia    hx of   . Prostate cancer [facility])    Radiation therapy 1996  . Skin cancer   . Stroke [facility])    TIA- 28 years ago    Past Surgical History:  Procedure Laterality Date  . CARDIAC CATHETERIZATION  01/01/2015   Procedure: CORONARY STENT INTERVENTION;  Surgeon: Peter M Martinique, MD;  Location: [facility] CATH LAB;  Service: Cardiovascular;;  SVG to PDA  . CARDIAC CATHETERIZATION N/A 11/21/2015   Procedure: Left Heart Cath and Cors/Grafts Angiography;  Surgeon: Troy Sine, MD;  Location: [place] CV LAB;  Service: Cardiovascular;  Laterality: N/A;  . CARDIAC CATHETERIZATION N/A 11/21/2015   Procedure: Coronary Stent Intervention;  Surgeon: Troy Sine, MD;  Location: [place] CV LAB;  Service: Cardiovascular;  Laterality: N/A;  . CARDIAC CATHETERIZATION N/A 09/11/2016   Procedure: Left Heart Cath and Cors/Grafts Angiography;  Surgeon: Leonie Man, MD;  Location: [place] CV LAB;  Service: Cardiovascular;  Laterality: N/A;  . CARDIAC CATHETERIZATION N/A 09/11/2016   Procedure: Coronary Stent Intervention;  Surgeon: Leonie Man, MD;  Location: [place] CV LAB;  Service: Cardiovascular;  Laterality: N/A;  . CORONARY ARTERY BYPASS GRAFT  2004   LIMA to LAD, SVG to diagonal, SVG to circumflex, SVG to PDA  . CYSTOSCOPY WITH URETHRAL DILATATION N/A 03/28/2019   Procedure: CYSTOSCOPY WITH URETHRAL  DILATATION OF STRICTURE;  Surgeon: Irine Seal, MD;  Location: [facility] ORS;  Service: Urology;  Laterality: N/A;  . GREEN LIGHT LASER TURP (TRANSURETHRAL RESECTION OF PROSTATE N/A 06/04/2016   Procedure: GREEN LIGHT LASER TURP (TRANSURETHRAL RESECTION OF PROSTATE;  Surgeon: Irine Seal, MD;  Location: [facility] ORS;  Service: Urology;  Laterality: N/A;  . LEFT HEART CATHETERIZATION WITH CORONARY/GRAFT ANGIOGRAM N/A 01/01/2015   Procedure: LEFT HEART CATHETERIZATION WITH Beatrix Fetters;  Surgeon: Peter M Martinique, MD;  Location: [facility] CATH LAB;  Service: Cardiovascular;  Laterality: N/A;  . PERCUTANEOUS CORONARY STENT INTERVENTION (PCI-S)  11/20/2015   distal SVG  with DES       . TONSILLECTOMY     Current Outpatient Medications on File Prior to Visit  Medication Sig Dispense Refill  . mirabegron ER (MYRBETRIQ) 50 MG TB24 tablet Take 50 mg by mouth daily.    Marland Kitchen acetaminophen (TYLENOL) 325 MG tablet Take 2 tablets (650 mg total) by mouth every 4 (four) hours as needed for headache or mild pain.    Marland Kitchen amLODipine (NORVASC) 5 MG tablet TAKE ONE TABLET BY MOUTH DAILY (MORNING) (Patient taking differently: Take 5 mg by mouth every morning. ) 30 tablet 3  . calcium carbonate (OS-CAL - DOSED IN MG OF ELEMENTAL CALCIUM) 1250 (500 Ca) MG tablet Take 1 tablet by mouth daily.     . clopidogrel (PLAVIX) 75 MG tablet TAKE ONE TABLET BY MOUTH EVERY DAY (,EVENING) 30  tablet 6  . denosumab (XGEVA) 120 MG/1.7ML SOLN injection Inject 120 mg into the skin once.    Marland Kitchen ELIQUIS 5 MG TABS tablet Take 1 tablet (5 mg total) by mouth 2 (two) times daily. 60 tablet 9  . isosorbide mononitrate (IMDUR) 60 MG 24 hr tablet TAKE ONE TABLET BY MOUTH DAILY (MORNING) 90 tablet 3  . Leuprolide Acetate (LUPRON IJ) Inject as directed every 4 (four) months.     . linaclotide (LINZESS) 290 MCG CAPS capsule Take 1 capsule (290 mcg total) by mouth daily before breakfast. 30 capsule 3  . metoprolol tartrate (LOPRESSOR) 50 MG tablet Take 1 tablet (50 mg total) by mouth 2 (two) times daily. 180 tablet 3  . nitroGLYCERIN (NITROSTAT) 0.4 MG SL tablet DISSOLVE ONE TABLET UNDER TONGUE EVERY 5 MINUTES UP TO 3 DOSES AS NEEDED FOR CHEST PAIN 25 tablet 3  . nystatin cream (MYCOSTATIN) APPLY A SMALL AMOUNT AS DIRECTED TWICE DAILY.    Marland Kitchen ondansetron (ZOFRAN ODT) 4 MG disintegrating tablet Take 1 tablet (4 mg total) by mouth every 8 (eight) hours as needed. 10 tablet 0  . rosuvastatin (CRESTOR) 20 MG tablet Take 1 tablet (20 mg  total) by mouth every evening. 90 tablet 3  . STOOL SOFTENER 100 MG capsule Take 100-200 mg by mouth daily as needed for mild constipation.     . traMADol (ULTRAM) 50 MG tablet Take 0.5 tablets (25 mg total) by mouth daily as needed. 30 tablet 0  . XTANDI 40 MG capsule TAKE 4 CAPSULES (160 MG TOTAL) BY MOUTH DAILY. 120 capsule 3   No current facility-administered medications on file prior to visit.   No Known Allergies  Review of Systems: See HPI, all other systems reviewed and are negative      Objective:   Physical Exam Blood pressure 136/83, pulse 63, temperature 97.6 F (36.4 C), height 5\' 8"  (1.727 m), weight 187 lb 6.4 oz (85 kg). General: alert and oriented male in NAD, hard of hearing Eyes: sclera nonicteric, conjunctiva pink Neck: supple, no thyromegaly or lymphadenopathy Heart: RRR, no murmr Lungs: clear throughout Abdomen: soft, mild tenderness to the right and left of the suprapubic catheter, the catheter insertion site is mildly erythematous without exudate, no rebound or guarding, + BS x 4 quads. No HSM. GU: suprapubic catheter draining clear yellow urine, no hematuria Rectal: no significant external hemorrhoids, a large hard bony mass palpated  through the anterior rectal wall in the area of prostate, this area is tender and non-mobile.  Extremities: no edema. Neuro: alert and oriented x 4, answers questions appropriately, speech is clear     Assessment & Plan:   38. 85 year old male with recurrent prostate prostate cancer with bone metastasis to the right ilium presents with rectal pain. Rectal pain most likely due to large bony mass to the prostate -follow up with oncologist, patient will need pain management as he is taking Tylenol 1gm po qid without  pain relief  -repeat abd/pelvic CT with focus on rectum  -no plans for colonoscopy evaluation,  further recommendations per  Dr. Laural Golden  2. Lower abdominal pain primarily right and left of his suprapubic catheter. The  catheter is draining clear yellow urine.  - repeat abd/pelvic CT -CBC, CMP, CRP  3. Constipation -continue Linzess 287mcg once daily   4. CAD 5. Afib on Eliquis

## 2019-08-07 ENCOUNTER — Other Ambulatory Visit (HOSPITAL_COMMUNITY): Payer: Self-pay | Admitting: Hematology

## 2019-08-07 DIAGNOSIS — C61 Malignant neoplasm of prostate: Secondary | ICD-10-CM

## 2019-08-08 ENCOUNTER — Other Ambulatory Visit: Payer: Self-pay

## 2019-08-08 ENCOUNTER — Encounter (INDEPENDENT_AMBULATORY_CARE_PROVIDER_SITE_OTHER): Payer: Self-pay | Admitting: Nurse Practitioner

## 2019-08-08 ENCOUNTER — Ambulatory Visit (INDEPENDENT_AMBULATORY_CARE_PROVIDER_SITE_OTHER): Payer: Medicare HMO | Admitting: Nurse Practitioner

## 2019-08-08 VITALS — BP 136/83 | HR 63 | Temp 97.6°F | Ht 68.0 in | Wt 187.4 lb

## 2019-08-08 DIAGNOSIS — K6289 Other specified diseases of anus and rectum: Secondary | ICD-10-CM | POA: Insufficient documentation

## 2019-08-08 DIAGNOSIS — R1030 Lower abdominal pain, unspecified: Secondary | ICD-10-CM | POA: Insufficient documentation

## 2019-08-08 NOTE — Patient Instructions (Signed)
1. Continue Linzess 290 mcg once daily as needed  2. Our office will contact you in the next 1 to 2 days to schedule an abdominal/pelvic/rectal CT scan

## 2019-08-10 ENCOUNTER — Telehealth (INDEPENDENT_AMBULATORY_CARE_PROVIDER_SITE_OTHER): Payer: Self-pay | Admitting: Nurse Practitioner

## 2019-08-10 NOTE — Telephone Encounter (Signed)
I called [name], I consulted with Dr. Laural Golden and he agreed with repeating an abd/pelvic/rectal CT as discussed at the time of the patient's office visit 08/08/2019. He stated he is scheduled to see Dr. Raliegh Ip, his cancer doctor, next week. He wishes to discuss having another CT with Dr. Raliegh Ip. I also advised that he contact Dr. Woody Seller this week for pain management. Patient was advised to reduce Tylenol 500mg  2 tabs po tid and not to take more than 3 g in 24 hours.  Also discussed pain management with Dr. Raliegh Ip.  I asked Mr. Steidle to give me a call with a further update after he sees Dr. Raliegh Ip.

## 2019-08-15 ENCOUNTER — Telehealth (INDEPENDENT_AMBULATORY_CARE_PROVIDER_SITE_OTHER): Payer: Self-pay | Admitting: Nurse Practitioner

## 2019-08-15 NOTE — Telephone Encounter (Signed)
I spoke to patient's daughter. I discussed patient to follow up with his oncologist, Dr. Raliegh Ip and his PCP for pain management. I discussed with daughter that I recommended the patient to have and abd/pelvic/rectal CT scan but he declined to schedule. He stated he has an appt with Dr. Raliegh Ip this Friday and he wished to discuss this with him. PT was advised not to take Tylenol 4gm daily, no more than 1gm tid advised. [name] stated her dad has a little confusion and his office appt with Dr. Raliegh Ip is not until next week. Pt saw his urologist today, I advised [name] to call me with any further update.

## 2019-08-15 NOTE — Telephone Encounter (Signed)
Daughter [name] called - stated she would like a call back regarding the plan of care - states her father seems to be somewhat confused - her ph# [phone]

## 2019-08-16 ENCOUNTER — Telehealth (INDEPENDENT_AMBULATORY_CARE_PROVIDER_SITE_OTHER): Payer: Self-pay | Admitting: Nurse Practitioner

## 2019-08-16 MED FILL — XTANDI 40 MG CAPSULE: 40 | 30 days supply | Qty: 120 | Fill #0

## 2019-08-16 NOTE — Telephone Encounter (Signed)
Daughter left message stating you had talked about sending a copy of something to Dr Jeffie Pollock - her ph# [phone]

## 2019-08-18 ENCOUNTER — Ambulatory Visit (INDEPENDENT_AMBULATORY_CARE_PROVIDER_SITE_OTHER): Payer: Medicare HMO | Admitting: Urology

## 2019-08-18 ENCOUNTER — Other Ambulatory Visit: Payer: Self-pay

## 2019-08-18 DIAGNOSIS — R338 Other retention of urine: Secondary | ICD-10-CM | POA: Diagnosis not present

## 2019-08-20 ENCOUNTER — Encounter (HOSPITAL_COMMUNITY): Payer: Self-pay

## 2019-08-20 ENCOUNTER — Other Ambulatory Visit: Payer: Self-pay

## 2019-08-20 ENCOUNTER — Emergency Department (HOSPITAL_COMMUNITY)
Admission: EM | Admit: 2019-08-20 | Discharge: 2019-08-20 | Disposition: A | Discharge status: Home / Self Care | Payer: Medicare HMO | Attending: Emergency Medicine | Admitting: Emergency Medicine

## 2019-08-20 DIAGNOSIS — Z85828 Personal history of other malignant neoplasm of skin: Secondary | ICD-10-CM | POA: Diagnosis not present

## 2019-08-20 DIAGNOSIS — Z87891 Personal history of nicotine dependence: Secondary | ICD-10-CM | POA: Insufficient documentation

## 2019-08-20 DIAGNOSIS — T83090A Other mechanical complication of cystostomy catheter, initial encounter: Secondary | ICD-10-CM

## 2019-08-20 DIAGNOSIS — Z7901 Long term (current) use of anticoagulants: Secondary | ICD-10-CM | POA: Diagnosis not present

## 2019-08-20 DIAGNOSIS — J45909 Unspecified asthma, uncomplicated: Secondary | ICD-10-CM | POA: Diagnosis not present

## 2019-08-20 DIAGNOSIS — Z79899 Other long term (current) drug therapy: Secondary | ICD-10-CM | POA: Insufficient documentation

## 2019-08-20 DIAGNOSIS — Y69 Unspecified misadventure during surgical and medical care: Secondary | ICD-10-CM | POA: Diagnosis not present

## 2019-08-20 DIAGNOSIS — Z951 Presence of aortocoronary bypass graft: Secondary | ICD-10-CM | POA: Insufficient documentation

## 2019-08-20 DIAGNOSIS — Z8546 Personal history of malignant neoplasm of prostate: Secondary | ICD-10-CM | POA: Insufficient documentation

## 2019-08-20 DIAGNOSIS — I10 Essential (primary) hypertension: Secondary | ICD-10-CM | POA: Insufficient documentation

## 2019-08-20 DIAGNOSIS — Z8673 Personal history of transient ischemic attack (TIA), and cerebral infarction without residual deficits: Secondary | ICD-10-CM | POA: Insufficient documentation

## 2019-08-20 DIAGNOSIS — T83198A Other mechanical complication of other urinary devices and implants, initial encounter: Secondary | ICD-10-CM | POA: Diagnosis not present

## 2019-08-20 DIAGNOSIS — R319 Hematuria, unspecified: Secondary | ICD-10-CM | POA: Diagnosis not present

## 2019-08-20 NOTE — ED Notes (Signed)
ED Provider at bedside. 

## 2019-08-20 NOTE — ED Triage Notes (Signed)
Pt arrives from home c/o hematuria after recent suprapubic catheter replacement on Friday. 16Fr catheter in place with hematuria noted upon observation.

## 2019-08-20 NOTE — ED Provider Notes (Signed)
[facility] EMERGENCY DEPARTMENT Provider Note   CSN: [id number] Arrival date & time: 08/20/19  0440     History   Chief Complaint Chief Complaint  Patient presents with  . Hematuria    HPI Elijah Santana is a 85 y.o. male.     Patient presents to the emergency department because his suprapubic catheter is blocked.  Patient reports that this catheter was changed 2 days ago.  He reports that overnight it stopped draining and he has noticed blood mixed in with the urine as well as some clots.     Past Medical History:  Diagnosis Date  . Arthritis   . Asthma    Childhood  . Chronic back pain   . Coronary atherosclerosis of native coronary artery    a. CABG 2004 with LIMA-LAD, SVG-D1, SVG-OM, SVG-PDA. b. Cath ~2010 with occ of SVG-diagonal, distal LAD and OM filiing by collaterals. c. NSTEMI 12/2014 s/p DES to SVG-RCA. d. 11/2015: DES to distal graft of the SVG-distal RCA  . Essential hypertension   . Foley catheter in place   . GERD (gastroesophageal reflux disease)   . Hard of hearing   . Hyperglycemia   . Ischemic cardiomyopathy    a. EF 45% by cath 12/2014.  . Mixed hyperlipidemia   . NSTEMI (non-ST elevated myocardial infarction) ([place]) 01/01/15  . Paroxysmal atrial fibrillation (HCC)   . Pneumonia    hx of   . Prostate cancer [facility])    Radiation therapy 1996  . Skin cancer   . Stroke [facility])    TIA- 28 years ago     Patient Active Problem List   Diagnosis Date Noted  . Rectal pain 08/08/2019  . Lower abdominal pain 08/08/2019  . Urinary retention 03/24/2019  . Bilateral hydronephrosis 03/24/2019  . Uncontrolled hypertension 03/24/2019  . Acute lower UTI 03/24/2019  . Anemia 03/24/2019  . Bone metastasis ([place]) 04/08/2018  . Prostate cancer ([place]) 11/27/2017  . Atrial fibrillation, rapid -new 09/10/16 09/10/2016  . PAF- in setting of NSTEMI 09/10/2016  . Accelerating angina ([place])   . CAD S/P percutaneous coronary angioplasty   . Coronary artery disease with hx  of myocardial infarct w/o hx of CABG 03/21/2015  . Ischemic cardiomyopathy   . Hyperglycemia   . Essential hypertension   . BPH (benign prostatic hyperplasia)   . NSTEMI- Troponin peak 6/47 12/31/2014  . S/P CABG x 4 2004 12/31/2014  . GERD (gastroesophageal reflux disease) 04/29/2012  . Mixed hyperlipidemia   . Essential hypertension, benign 08/06/2009    Past Surgical History:  Procedure Laterality Date  . CARDIAC CATHETERIZATION  01/01/2015   Procedure: CORONARY STENT INTERVENTION;  Surgeon: Peter M Martinique, MD;  Location: [facility] CATH LAB;  Service: Cardiovascular;;  SVG to PDA  . CARDIAC CATHETERIZATION N/A 11/21/2015   Procedure: Left Heart Cath and Cors/Grafts Angiography;  Surgeon: Troy Sine, MD;  Location: [place] CV LAB;  Service: Cardiovascular;  Laterality: N/A;  . CARDIAC CATHETERIZATION N/A 11/21/2015   Procedure: Coronary Stent Intervention;  Surgeon: Troy Sine, MD;  Location: [place] CV LAB;  Service: Cardiovascular;  Laterality: N/A;  . CARDIAC CATHETERIZATION N/A 09/11/2016   Procedure: Left Heart Cath and Cors/Grafts Angiography;  Surgeon: Leonie Man, MD;  Location: [place] CV LAB;  Service: Cardiovascular;  Laterality: N/A;  . CARDIAC CATHETERIZATION N/A 09/11/2016   Procedure: Coronary Stent Intervention;  Surgeon: Leonie Man, MD;  Location: [place] CV LAB;  Service: Cardiovascular;  Laterality: N/A;  .  CORONARY ARTERY BYPASS GRAFT  2004   LIMA to LAD, SVG to diagonal, SVG to circumflex, SVG to PDA  . CYSTOSCOPY WITH URETHRAL DILATATION N/A 03/28/2019   Procedure: CYSTOSCOPY WITH URETHRAL  DILATATION OF STRICTURE;  Surgeon: Irine Seal, MD;  Location: [facility] ORS;  Service: Urology;  Laterality: N/A;  . GREEN LIGHT LASER TURP (TRANSURETHRAL RESECTION OF PROSTATE N/A 06/04/2016   Procedure: GREEN LIGHT LASER TURP (TRANSURETHRAL RESECTION OF PROSTATE;  Surgeon: Irine Seal, MD;  Location: [facility];  Service: Urology;  Laterality: N/A;  . LEFT HEART  CATHETERIZATION WITH CORONARY/GRAFT ANGIOGRAM N/A 01/01/2015   Procedure: LEFT HEART CATHETERIZATION WITH Beatrix Fetters;  Surgeon: Peter M Martinique, MD;  Location: [facility] CATH LAB;  Service: Cardiovascular;  Laterality: N/A;  . PERCUTANEOUS CORONARY STENT INTERVENTION (PCI-S)  11/20/2015   distal SVG  with DES       . TONSILLECTOMY          Home Medications    Prior to Admission medications   Medication Sig Start Date End Date Taking? Authorizing Provider  acetaminophen (TYLENOL) 325 MG tablet Take 2 tablets (650 mg total) by mouth every 4 (four) hours as needed for headache or mild pain. 09/12/16   Erlene Quan, PA-C  amLODipine (NORVASC) 5 MG tablet TAKE ONE TABLET BY MOUTH DAILY (MORNING) Patient taking differently: Take 5 mg by mouth every morning.  11/22/17   Satira Sark, MD  calcium carbonate (OS-CAL - DOSED IN MG OF ELEMENTAL CALCIUM) 1250 (500 Ca) MG tablet Take 1 tablet by mouth daily.     [provider]  clopidogrel (PLAVIX) 75 MG tablet TAKE ONE TABLET BY MOUTH EVERY DAY (,EVENING) 06/23/19   Satira Sark, MD  denosumab (XGEVA) 120 MG/1.7ML SOLN injection Inject 120 mg into the skin once.    [provider]  ELIQUIS 5 MG TABS tablet Take 1 tablet (5 mg total) by mouth 2 (two) times daily. 06/26/19   Satira Sark, MD  isosorbide mononitrate (IMDUR) 60 MG 24 hr tablet TAKE ONE TABLET BY MOUTH DAILY (MORNING) 06/23/19   Satira Sark, MD  Leuprolide Acetate (LUPRON IJ) Inject as directed every 4 (four) months.     [provider]  linaclotide Rolan Lipa) 290 MCG CAPS capsule Take 1 capsule (290 mcg total) by mouth daily before breakfast. 02/23/19   Setzer, Rona Ravens, NP  metoprolol tartrate (LOPRESSOR) 50 MG tablet Take 1 tablet (50 mg total) by mouth 2 (two) times daily. 06/23/19   Satira Sark, MD  mirabegron ER (MYRBETRIQ) 50 MG TB24 tablet Take 50 mg by mouth daily.    [provider]  nitroGLYCERIN (NITROSTAT) 0.4 MG SL  tablet DISSOLVE ONE TABLET UNDER TONGUE EVERY 5 MINUTES UP TO 3 DOSES AS NEEDED FOR CHEST PAIN 03/27/19   Satira Sark, MD  nystatin cream (MYCOSTATIN) APPLY A SMALL AMOUNT AS DIRECTED TWICE DAILY. 06/02/19   [provider]  ondansetron (ZOFRAN ODT) 4 MG disintegrating tablet Take 1 tablet (4 mg total) by mouth every 8 (eight) hours as needed. 03/26/19   Isla Pence, MD  rosuvastatin (CRESTOR) 20 MG tablet Take 1 tablet (20 mg total) by mouth every evening. 06/23/19   Satira Sark, MD  STOOL SOFTENER 100 MG capsule Take 100-200 mg by mouth daily as needed for mild constipation.  01/02/19   [provider]  traMADol (ULTRAM) 50 MG tablet Take 0.5 tablets (25 mg total) by mouth daily as needed. 12/01/18   Lockamy, Theresia Lo,  NP-C  XTANDI 40 MG capsule TAKE 4 CAPSULES (160 MG TOTAL) BY MOUTH DAILY. 08/07/19   Derek Jack, MD    Family History Family History  Problem Relation Age of Onset  . Hypertension Father   . Coronary artery disease Father     Social History Social History   Tobacco Use  . Smoking status: Former Smoker    Packs/day: 0.50    Years: 27.00    Pack years: 13.50    Types: Cigars    Start date: 07/28/1964    Quit date: 07/28/1986    Years since quitting: 33.0  . Smokeless tobacco: Former Systems developer    Types: Chew    Quit date: 08/07/2010  . Tobacco comment: never chewed up over a pack/day  Substance Use Topics  . Alcohol use: No    Alcohol/week: 0.0 standard drinks  . Drug use: No     Allergies   Patient has no known allergies.   Review of Systems Review of Systems  Genitourinary: Positive for decreased urine volume and hematuria.  All other systems reviewed and are negative.    Physical Exam Updated Vital Signs BP 137/81   Pulse 69   Temp 97.8 F (36.6 C) (Oral)   Resp 18   Ht 5\' 8"  (1.727 m)   Wt 83.5 kg   SpO2 97%   BMI 27.98 kg/m   Physical Exam Vitals signs and nursing note reviewed.  Constitutional:       General: He is not in acute distress.    Appearance: Normal appearance. He is well-developed.  HENT:     Head: Normocephalic and atraumatic.     Right Ear: Hearing normal.     Left Ear: Hearing normal.     Nose: Nose normal.  Eyes:     Conjunctiva/sclera: Conjunctivae normal.     Pupils: Pupils are equal, round, and reactive to light.  Neck:     Musculoskeletal: Normal range of motion and neck supple.  Cardiovascular:     Rate and Rhythm: Regular rhythm.     Heart sounds: S1 normal and S2 normal. No murmur. No friction rub. No gallop.   Pulmonary:     Effort: Pulmonary effort is normal. No respiratory distress.     Breath sounds: Normal breath sounds.  Chest:     Chest wall: No tenderness.  Abdominal:     General: Bowel sounds are normal.     Palpations: Abdomen is soft.     Tenderness: There is no abdominal tenderness. There is no guarding or rebound. Negative signs include Murphy's sign and McBurney's sign.     Hernia: No hernia is present.  Musculoskeletal: Normal range of motion.  Skin:    General: Skin is warm and dry.     Findings: No rash.  Neurological:     Mental Status: He is alert and oriented to person, place, and time.     GCS: GCS eye subscore is 4. GCS verbal subscore is 5. GCS motor subscore is 6.     Cranial Nerves: No cranial nerve deficit.     Sensory: No sensory deficit.     Coordination: Coordination normal.  Psychiatric:        Speech: Speech normal.        Behavior: Behavior normal.        Thought Content: Thought content normal.      ED Treatments / Results  Labs (all labs ordered are listed, but only abnormal results are displayed) Labs Reviewed - No  data to display  EKG None  Radiology No results found.  Procedures Procedures (including critical care time)  Medications Ordered in ED Medications - No data to display   Initial Impression / Assessment and Plan / ED Course  I have reviewed the triage vital signs and the nursing  notes.  Pertinent labs & imaging results that were available during my care of the patient were reviewed by me and considered in my medical decision making (see chart for details).        Patient complains of blocked catheter.  He has a chronic indwelling suprapubic catheter.  Patient has noticed blood in the urine and now he presents with a blockage from a clot.  This was irrigated and his catheter is draining freely without any further clots seen.  Patient just had the catheter changed 2 days ago, I do not feel he requires a repeat changed today as this might cause more irritation and bleeding.  Patient monitored here in the ER and continues to drain freely, will discharge.  Final Clinical Impressions(s) / ED Diagnoses   Final diagnoses:  Hematuria, unspecified type  Blocked suprapubic catheter, initial encounter [facility])    ED Discharge Orders    None       Betsey Holiday Gwenyth Allegra, MD 08/20/19 0530

## 2019-08-21 NOTE — Telephone Encounter (Signed)
Ann, can you please fax a copy of the patient's last office visit to his urologist Dr. Irine Seal. Can you also send a request so I can have a copy of the patient's most recent urology office visit that was last week? thx

## 2019-08-22 NOTE — Telephone Encounter (Signed)
Our note faxed to Dr Jeffie Pollock and request faxed to [facility] to get their last OV note

## 2019-08-24 ENCOUNTER — Ambulatory Visit (HOSPITAL_COMMUNITY): Payer: Medicare HMO | Admitting: Hematology

## 2019-08-24 ENCOUNTER — Encounter (HOSPITAL_COMMUNITY): Payer: Self-pay | Admitting: Hematology

## 2019-08-24 ENCOUNTER — Inpatient Hospital Stay (HOSPITAL_COMMUNITY): Payer: Medicare HMO

## 2019-08-24 ENCOUNTER — Inpatient Hospital Stay (HOSPITAL_COMMUNITY): Payer: Medicare HMO | Admitting: Hematology

## 2019-08-24 ENCOUNTER — Other Ambulatory Visit: Payer: Self-pay

## 2019-08-24 ENCOUNTER — Inpatient Hospital Stay (HOSPITAL_COMMUNITY): Payer: Medicare HMO | Attending: Hematology

## 2019-08-24 VITALS — BP 144/86 | HR 59 | Temp 97.5°F | Resp 16 | Wt 187.2 lb

## 2019-08-24 DIAGNOSIS — C7951 Secondary malignant neoplasm of bone: Secondary | ICD-10-CM | POA: Insufficient documentation

## 2019-08-24 DIAGNOSIS — Z85828 Personal history of other malignant neoplasm of skin: Secondary | ICD-10-CM | POA: Insufficient documentation

## 2019-08-24 DIAGNOSIS — I480 Paroxysmal atrial fibrillation: Secondary | ICD-10-CM | POA: Diagnosis not present

## 2019-08-24 DIAGNOSIS — Z23 Encounter for immunization: Secondary | ICD-10-CM | POA: Diagnosis not present

## 2019-08-24 DIAGNOSIS — M25552 Pain in left hip: Secondary | ICD-10-CM | POA: Insufficient documentation

## 2019-08-24 DIAGNOSIS — Z923 Personal history of irradiation: Secondary | ICD-10-CM | POA: Diagnosis not present

## 2019-08-24 DIAGNOSIS — C61 Malignant neoplasm of prostate: Secondary | ICD-10-CM

## 2019-08-24 DIAGNOSIS — R319 Hematuria, unspecified: Secondary | ICD-10-CM | POA: Insufficient documentation

## 2019-08-24 DIAGNOSIS — R200 Anesthesia of skin: Secondary | ICD-10-CM | POA: Insufficient documentation

## 2019-08-24 DIAGNOSIS — Z8249 Family history of ischemic heart disease and other diseases of the circulatory system: Secondary | ICD-10-CM | POA: Insufficient documentation

## 2019-08-24 DIAGNOSIS — Z79899 Other long term (current) drug therapy: Secondary | ICD-10-CM | POA: Insufficient documentation

## 2019-08-24 DIAGNOSIS — C771 Secondary and unspecified malignant neoplasm of intrathoracic lymph nodes: Secondary | ICD-10-CM | POA: Diagnosis not present

## 2019-08-24 DIAGNOSIS — Z7901 Long term (current) use of anticoagulants: Secondary | ICD-10-CM | POA: Insufficient documentation

## 2019-08-24 DIAGNOSIS — Z8673 Personal history of transient ischemic attack (TIA), and cerebral infarction without residual deficits: Secondary | ICD-10-CM | POA: Insufficient documentation

## 2019-08-24 DIAGNOSIS — E785 Hyperlipidemia, unspecified: Secondary | ICD-10-CM | POA: Insufficient documentation

## 2019-08-24 DIAGNOSIS — M549 Dorsalgia, unspecified: Secondary | ICD-10-CM | POA: Diagnosis not present

## 2019-08-24 DIAGNOSIS — Z87891 Personal history of nicotine dependence: Secondary | ICD-10-CM | POA: Diagnosis not present

## 2019-08-24 LAB — COMPREHENSIVE METABOLIC PANEL
ALT: 8 U/L (ref 0–44)
AST: 9 U/L — ABNORMAL LOW (ref 15–41)
Albumin: 3.4 g/dL — ABNORMAL LOW (ref 3.5–5.0)
Alkaline Phosphatase: 43 U/L (ref 38–126)
Anion gap: 8 (ref 5–15)
BUN: 12 mg/dL (ref 8–23)
CO2: 25 mmol/L (ref 22–32)
Calcium: 8.3 mg/dL — ABNORMAL LOW (ref 8.9–10.3)
Chloride: 107 mmol/L (ref 98–111)
Creatinine, Ser: 0.63 mg/dL (ref 0.61–1.24)
GFR calc Af Amer: >60 mL/min (ref >60)
GFR calc non Af Amer: >60 mL/min (ref >60)
Glucose, Bld: 99 mg/dL (ref 70–99)
Potassium: 3.9 mmol/L (ref 3.5–5.1)
Sodium: 140 mmol/L (ref 135–145)
Total Bilirubin: 0.4 mg/dL (ref 0.3–1.2)
Total Protein: 6.8 g/dL (ref 6.5–8.1)

## 2019-08-24 LAB — PSA: Prostatic Specific Antigen: 0.1 ng/mL (ref 0.00–4.00)

## 2019-08-24 MED ORDER — INFLUENZA VAC A&B SA ADJ QUAD 0.5 ML IM PRSY
0.5000 mL | PREFILLED_SYRINGE | Freq: Once | INTRAMUSCULAR | Status: AC
  Administered 2019-08-24: 0.5 mL via INTRAMUSCULAR
  Filled 2019-08-24: qty 0.5

## 2019-08-24 MED ORDER — DENOSUMAB 120 MG/1.7ML ~~LOC~~ SOLN
120.0000 mg | Freq: Once | SUBCUTANEOUS | Status: AC
  Administered 2019-08-24: 120 mg via SUBCUTANEOUS
  Filled 2019-08-24: qty 1.7

## 2019-08-24 NOTE — Assessment & Plan Note (Signed)
1.  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 - Fluciclovine PET CT scan showed lymphadenopathy in the chest and sclerotic lesion in the right ilium, diffuse enhancement of the prostate. - Enzalutamide was started on 02/08/2018 at 80 mg daily, increased to 4 tablets daily in the first week of June 2019. - He received last Lupron injection at Dr. Jethro Poling office in mid May 2020. - He is having intermittent hematuria from the suprapubic catheter. - He is tolerating enzalutamide very well.  His PSA is down to 0.10 today. - He was told to follow-up with Dr. Ralene Muskrat office next month and do Lupron injection. - He will continue enzalutamide at full dose.  We will see him back in 2 months for follow-up and check his PSA.  2.  Bone metastasis: -He had 1 metastatic site on fluciclovine PET CT scan on the right ilium. -Denosumab started on 05/02/2018.  He tolerates it well.  He will continue calcium.   3.  Paroxysmal atrial fibrillation: - He is continuing Eliquis.

## 2019-08-24 NOTE — Progress Notes (Signed)
Patient taking calcium as directed.  Denied tooth, jaw, and leg pain.  No recent or upcoming dental visits.  Patient tolerated injection with no complaints.  Site clean and dry with no bruising or swelling noted at site.  Band aid applied.  VSS with discharge and left ambulatory with no s/s of distress noted. 

## 2019-08-24 NOTE — Progress Notes (Signed)
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   INTERVAL HISTORY:  Mr. Fechter 85 y.o. male seen for follow-up of metastatic prostate cancer.  He is taking enzalutamide 160 mg daily.  Appetite is 100%.  Energy levels are 50%.  Complains of chronic left hip and back pain, 4 out of 10 in intensity.  Numbness in the feet has been stable.  Has intermittent hematuria from suprapubic catheter.  Denies any nausea, vomiting, diarrhea or constipation.  Denies any falls.  No bleeding per rectum or melena.   REVIEW OF SYSTEMS:  Review of Systems  Neurological: Positive for numbness.  All other systems reviewed and are negative.    PAST MEDICAL/SURGICAL HISTORY:  Past Medical History:  Diagnosis Date  . Arthritis   . Asthma    Childhood  . Chronic back pain   . Coronary atherosclerosis of native coronary artery    a. CABG 2004 with LIMA-LAD, SVG-D1, SVG-OM, SVG-PDA. b. Cath ~2010 with occ of SVG-diagonal, distal LAD and OM filiing by collaterals. c. NSTEMI 12/2014 s/p DES to SVG-RCA. d. 11/2015: DES to distal graft of the SVG-distal RCA  . Essential hypertension   . Foley catheter in place   . GERD (gastroesophageal reflux disease)   . Hard of hearing   . Hyperglycemia   . Ischemic cardiomyopathy    a. EF 45% by cath 12/2014.  . Mixed hyperlipidemia   . NSTEMI (non-ST elevated myocardial infarction) ([place]) 01/01/15  . Paroxysmal atrial fibrillation ([facility])   . Pneumonia    hx of   . Prostate cancer [facility])    Radiation therapy  1996  . Skin cancer   . Stroke [facility])    TIA- 28 years ago    Past Surgical History:  Procedure Laterality Date  . CARDIAC CATHETERIZATION  01/01/2015   Procedure: CORONARY STENT INTERVENTION;  Surgeon: Peter M Martinique, MD;  Location: [facility] CATH LAB;  Service: Cardiovascular;;  SVG to PDA  . CARDIAC CATHETERIZATION N/A 11/21/2015   Procedure: Left Heart Cath and Cors/Grafts Angiography;  Surgeon: Troy Sine, MD;  Location: [place] CV LAB;  Service: Cardiovascular;  Laterality: N/A;  . CARDIAC CATHETERIZATION N/A 11/21/2015   Procedure: Coronary Stent Intervention;  Surgeon: Troy Sine, MD;  Location: [place] CV LAB;  Service: Cardiovascular;  Laterality: N/A;  . CARDIAC CATHETERIZATION N/A 09/11/2016   Procedure: Left Heart Cath and Cors/Grafts Angiography;  Surgeon: Leonie Man, MD;  Location: [place] CV LAB;  Service: Cardiovascular;  Laterality: N/A;  . CARDIAC CATHETERIZATION N/A 09/11/2016   Procedure: Coronary Stent Intervention;  Surgeon: Leonie Man, MD;  Location: [place] CV LAB;  Service: Cardiovascular;  Laterality: N/A;  . CORONARY ARTERY BYPASS GRAFT  2004   LIMA to LAD, SVG to diagonal, SVG to circumflex, SVG to PDA  . CYSTOSCOPY WITH URETHRAL DILATATION N/A 03/28/2019   Procedure: CYSTOSCOPY WITH URETHRAL  DILATATION OF STRICTURE;  Surgeon: Irine Seal, MD;  Location: [facility] ORS;  Service: Urology;  Laterality: N/A;  . GREEN  LIGHT LASER TURP (TRANSURETHRAL RESECTION OF PROSTATE N/A 06/04/2016   Procedure: GREEN LIGHT LASER TURP (TRANSURETHRAL RESECTION OF PROSTATE;  Surgeon: Irine Seal, MD;  Location: [facility] ORS;  Service: Urology;  Laterality: N/A;  . LEFT HEART CATHETERIZATION WITH CORONARY/GRAFT ANGIOGRAM N/A 01/01/2015   Procedure: LEFT HEART CATHETERIZATION WITH Beatrix Fetters;  Surgeon: Peter M Martinique, MD;  Location: [facility] CATH LAB;  Service: Cardiovascular;  Laterality: N/A;  . PERCUTANEOUS CORONARY STENT INTERVENTION (PCI-S)  11/20/2015   distal SVG   with DES       . TONSILLECTOMY       SOCIAL HISTORY:  Social History   Socioeconomic History  . Marital status: Married    Spouse name: Not on file  . Number of children: Not on file  . Years of education: Not on file  . Highest education level: Not on file  Occupational History  . Occupation: Retired    Comment: Office manager  Social Needs  . Financial resource strain: Not on file  . Food insecurity    Worry: Not on file    Inability: Not on file  . Transportation needs    Medical: Not on file    Non-medical: Not on file  Tobacco Use  . Smoking status: Former Smoker    Packs/day: 0.50    Years: 27.00    Pack years: 13.50    Types: Cigars    Start date: 07/28/1964    Quit date: 07/28/1986    Years since quitting: 33.0  . Smokeless tobacco: Former Systems developer    Types: Chew    Quit date: 08/07/2010  . Tobacco comment: never chewed up over a pack/day  Substance and Sexual Activity  . Alcohol use: No    Alcohol/week: 0.0 standard drinks  . Drug use: No  . Sexual activity: Not on file  Lifestyle  . Physical activity    Days per week: Not on file    Minutes per session: Not on file  . Stress: Not on file  Relationships  . Social Herbalist on phone: Not on file    Gets together: Not on file    Attends religious service: Not on file    Active member of club or organization: Not on file    Attends meetings of clubs or organizations: Not on file    Relationship status: Not on file  . Intimate partner violence    Fear of current or ex partner: Not on file    Emotionally abused: Not on file    Physically abused: Not on file    Forced sexual activity: Not on file  Other Topics Concern  . Not on file  Social History Narrative  . Not on file    FAMILY HISTORY:  Family History  Problem Relation Age of Onset  . Hypertension Father   . Coronary artery disease Father     CURRENT MEDICATIONS:  Outpatient Encounter Medications as of 08/24/2019   Medication Sig Note  . acetaminophen (TYLENOL) 325 MG tablet Take 2 tablets (650 mg total) by mouth every 4 (four) hours as needed for headache or mild pain.   Marland Kitchen amLODipine (NORVASC) 5 MG tablet TAKE ONE TABLET BY MOUTH DAILY (MORNING) (Patient taking differently: Take 5 mg by mouth every morning. )   . calcium carbonate (OS-CAL - DOSED IN MG OF ELEMENTAL CALCIUM) 1250 (500 Ca) MG tablet Take 1 tablet by mouth daily.    . clopidogrel (PLAVIX) 75 MG tablet TAKE ONE TABLET BY  MOUTH EVERY DAY (,EVENING)   . denosumab (XGEVA) 120 MG/1.7ML SOLN injection Inject 120 mg into the skin once.   Marland Kitchen ELIQUIS 5 MG TABS tablet Take 1 tablet (5 mg total) by mouth 2 (two) times daily.   . isosorbide mononitrate (IMDUR) 60 MG 24 hr tablet TAKE ONE TABLET BY MOUTH DAILY (MORNING)   . Leuprolide Acetate (LUPRON IJ) Inject as directed every 4 (four) months.  03/26/2019: Administered by the office of Dr. Jeffie Pollock every 4 months   . linaclotide (LINZESS) 290 MCG CAPS capsule Take 1 capsule (290 mcg total) by mouth daily before breakfast.   . metoprolol tartrate (LOPRESSOR) 50 MG tablet Take 1 tablet (50 mg total) by mouth 2 (two) times daily.   . mirabegron ER (MYRBETRIQ) 50 MG TB24 tablet Take 50 mg by mouth daily.   . nitroGLYCERIN (NITROSTAT) 0.4 MG SL tablet DISSOLVE ONE TABLET UNDER TONGUE EVERY 5 MINUTES UP TO 3 DOSES AS NEEDED FOR CHEST PAIN   . nystatin cream (MYCOSTATIN) APPLY A SMALL AMOUNT AS DIRECTED TWICE DAILY.   . rosuvastatin (CRESTOR) 20 MG tablet Take 1 tablet (20 mg total) by mouth every evening.   . traMADol (ULTRAM) 50 MG tablet Take 0.5 tablets (25 mg total) by mouth daily as needed.   Gillermina Phy 40 MG capsule TAKE 4 CAPSULES (160 MG TOTAL) BY MOUTH DAILY.   Marland Kitchen ondansetron (ZOFRAN ODT) 4 MG disintegrating tablet Take 1 tablet (4 mg total) by mouth every 8 (eight) hours as needed. (Patient not taking: Reported on 08/24/2019)   . STOOL SOFTENER 100 MG capsule Take 100-200 mg by mouth daily as needed for mild  constipation.    . [EXPIRED] influenza vaccine adjuvanted (FLUAD) injection 0.5 mL     No facility-administered encounter medications on file as of 08/24/2019.     ALLERGIES:  No Known Allergies   PHYSICAL EXAM:  ECOG Performance status: 1  Vitals:   08/24/19 1000  BP: (!) 144/86  Pulse: (!) 59  Resp: 16  Temp: (!) 97.5 F (36.4 C)  SpO2: 97%   Filed Weights   08/24/19 1000  Weight: 187 lb 4 oz (84.9 kg)    Physical Exam Vitals signs reviewed.  Constitutional:      Appearance: Normal appearance.  Cardiovascular:     Rate and Rhythm: Normal rate and regular rhythm.     Heart sounds: Normal heart sounds.  Pulmonary:     Effort: Pulmonary effort is normal.     Breath sounds: Normal breath sounds.  Abdominal:     General: There is no distension.     Palpations: Abdomen is soft. There is no mass.  Musculoskeletal:        General: No swelling.  Skin:    General: Skin is warm.  Neurological:     General: No focal deficit present.     Mental Status: He is alert and oriented to person, place, and time.  Psychiatric:        Mood and Affect: Mood normal.        Behavior: Behavior normal.      LABORATORY DATA:  I have reviewed the labs as listed.  CBC    Component Value Date/Time   WBC 7.9 06/29/2019 0840   RBC 4.61 06/29/2019 0840   HGB 13.2 06/29/2019 0840   HCT 43.3 06/29/2019 0840   PLT 254 06/29/2019 0840   MCV 93.9 06/29/2019 0840   MCH 28.6 06/29/2019 0840   MCHC 30.5 06/29/2019 0840   RDW  14.5 06/29/2019 0840   LYMPHSABS 1.5 06/29/2019 0840   MONOABS 0.5 06/29/2019 0840   EOSABS 0.3 06/29/2019 0840   BASOSABS 0.0 06/29/2019 0840   CMP Latest Ref Rng & Units 08/24/2019 07/27/2019 06/29/2019  Glucose 70 - 99 mg/dL 99 116(H) 108(H)  BUN 8 - 23 mg/dL 12 17 20   Creatinine 0.61 - 1.24 mg/dL 0.63 0.70 0.84  Sodium 135 - 145 mmol/L 140 138 139  Potassium 3.5 - 5.1 mmol/L 3.9 4.4 4.7  Chloride 98 - 111 mmol/L 107 106 108  CO2 22 - 32 mmol/L 25 26 24    Calcium 8.9 - 10.3 mg/dL 8.3(L) 8.9 8.7(L)  Total Protein 6.5 - 8.1 g/dL 6.8 6.7 6.9  Total Bilirubin 0.3 - 1.2 mg/dL 0.4 0.8 0.8  Alkaline Phos 38 - 126 U/L 43 42 40  AST 15 - 41 U/L 9(L) 12(L) 12(L)  ALT 0 - 44 U/L 8 10 13        DIAGNOSTIC IMAGING:  I have independently reviewed the scans and discussed with the patient.   I have reviewed Venita Lick LPN's note and agree with the documentation.  I personally performed a face-to-face visit, made revisions and my assessment and plan is as follows.    ASSESSMENT & PLAN:   Prostate cancer ([place]) 1.  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 - Fluciclovine PET CT scan showed lymphadenopathy in the chest and sclerotic lesion in the right ilium, diffuse enhancement of the prostate. - Enzalutamide was started on 02/08/2018 at 80 mg daily, increased to 4 tablets daily in the first week of June 2019. - He received last Lupron injection at Dr. Jethro Poling office in mid May 2020. - He is having intermittent hematuria from the suprapubic catheter. - He is tolerating enzalutamide very well.  His PSA is down to 0.10 today. - He was told to follow-up with Dr. Ralene Muskrat office next month and do Lupron injection. - He will continue enzalutamide at full dose.  We will see him back in 2 months for follow-up and check his PSA.  2.  Bone metastasis: -He had 1 metastatic site on fluciclovine PET CT scan on the right ilium. -Denosumab started on 05/02/2018.  He tolerates it well.  He will continue calcium.   3.  Paroxysmal atrial fibrillation: - He is continuing Eliquis.  Total time spent is 25 minutes with more than 50% of the time spent face-to-face discussing disease status, treatment plan and coordination of care.    Orders placed  this encounter:  Orders Placed This Encounter  Procedures  . CBC with Differential/Platelet  . Comprehensive metabolic panel  . PSA  . PSA      Derek Jack, MD [place] [phone]

## 2019-08-24 NOTE — Patient Instructions (Signed)
[facility] at [facility] Discharge Instructions  You were seen today by Dr. Katragadda. He went over your recent results. He will see you back in 2 months for labs and follow up.   Thank you for choosing [facility] at [facility] to provide your oncology and hematology care.  To afford each patient quality time with our provider, please arrive at least 15 minutes before your scheduled appointment time.   If you have a lab appointment with the Cancer Center please come in thru the  Main Entrance and check in at the main information desk  You need to re-schedule your appointment should you arrive 10 or more minutes late.  We strive to give you quality time with our providers, and arriving late affects you and other patients whose appointments are after yours.  Also, if you no show three or more times for appointments you may be dismissed from the clinic at the providers discretion.     Again, thank you for choosing [facility].  Our hope is that these requests will decrease the amount of time that you wait before being seen by our physicians.       _____________________________________________________________  Should you have questions after your visit to [facility], please contact our office at [phone] between the hours of 8:00 a.m. and 4:30 p.m.  Voicemails left after 4:00 p.m. will not be returned until the following business day.  For prescription refill requests, have your pharmacy contact our office and allow 72 hours.    Cancer Center Support Programs:   > Cancer Support Group  2nd Tuesday of the month 1pm-2pm, Journey Room    

## 2019-08-25 DIAGNOSIS — I4891 Unspecified atrial fibrillation: Secondary | ICD-10-CM | POA: Diagnosis not present

## 2019-08-25 DIAGNOSIS — Z299 Encounter for prophylactic measures, unspecified: Secondary | ICD-10-CM | POA: Diagnosis not present

## 2019-08-25 DIAGNOSIS — Z87891 Personal history of nicotine dependence: Secondary | ICD-10-CM | POA: Diagnosis not present

## 2019-08-25 DIAGNOSIS — I10 Essential (primary) hypertension: Secondary | ICD-10-CM | POA: Diagnosis not present

## 2019-08-25 DIAGNOSIS — I2510 Atherosclerotic heart disease of native coronary artery without angina pectoris: Secondary | ICD-10-CM | POA: Diagnosis not present

## 2019-08-25 DIAGNOSIS — Z6828 Body mass index (BMI) 28.0-28.9, adult: Secondary | ICD-10-CM | POA: Diagnosis not present

## 2019-08-28 ENCOUNTER — Encounter (INDEPENDENT_AMBULATORY_CARE_PROVIDER_SITE_OTHER): Payer: Self-pay | Admitting: *Deleted

## 2019-08-28 ENCOUNTER — Telehealth (INDEPENDENT_AMBULATORY_CARE_PROVIDER_SITE_OTHER): Payer: Self-pay | Admitting: Nurse Practitioner

## 2019-08-28 DIAGNOSIS — M159 Polyosteoarthritis, unspecified: Secondary | ICD-10-CM | POA: Diagnosis not present

## 2019-08-28 DIAGNOSIS — I2510 Atherosclerotic heart disease of native coronary artery without angina pectoris: Secondary | ICD-10-CM | POA: Diagnosis not present

## 2019-08-28 DIAGNOSIS — I10 Essential (primary) hypertension: Secondary | ICD-10-CM | POA: Diagnosis not present

## 2019-08-28 NOTE — Telephone Encounter (Signed)
Daughter called stated patients oncologist had cleared patient to have a CT  - her ph# [phone]

## 2019-08-30 ENCOUNTER — Ambulatory Visit (INDEPENDENT_AMBULATORY_CARE_PROVIDER_SITE_OTHER): Payer: Medicare HMO | Admitting: Urology

## 2019-08-30 DIAGNOSIS — R338 Other retention of urine: Secondary | ICD-10-CM

## 2019-09-01 ENCOUNTER — Other Ambulatory Visit (INDEPENDENT_AMBULATORY_CARE_PROVIDER_SITE_OTHER): Payer: Self-pay | Admitting: Nurse Practitioner

## 2019-09-01 DIAGNOSIS — Z8546 Personal history of malignant neoplasm of prostate: Secondary | ICD-10-CM

## 2019-09-01 DIAGNOSIS — K6289 Other specified diseases of anus and rectum: Secondary | ICD-10-CM

## 2019-09-01 DIAGNOSIS — R1030 Lower abdominal pain, unspecified: Secondary | ICD-10-CM

## 2019-09-01 NOTE — Telephone Encounter (Signed)
CT sch'd 09/05/19 at 1200 (1145), npo 4 hrs, pick up contrast, patient' daughter [name] is aware

## 2019-09-01 NOTE — Telephone Encounter (Signed)
Ann, pls call patient to schedule abd/pelvic CT with contrast as soon as possible as patient delayed having this done until he saw his oncologist. thx

## 2019-09-05 ENCOUNTER — Other Ambulatory Visit: Payer: Self-pay

## 2019-09-05 ENCOUNTER — Ambulatory Visit (HOSPITAL_COMMUNITY)
Admission: RE | Admit: 2019-09-05 | Discharge: 2019-09-05 | Disposition: A | Discharge status: Home / Self Care | Payer: Medicare HMO | Source: Ambulatory Visit | Attending: Nurse Practitioner | Admitting: Nurse Practitioner

## 2019-09-05 DIAGNOSIS — Z8546 Personal history of malignant neoplasm of prostate: Secondary | ICD-10-CM | POA: Diagnosis not present

## 2019-09-05 DIAGNOSIS — K6289 Other specified diseases of anus and rectum: Secondary | ICD-10-CM | POA: Diagnosis not present

## 2019-09-05 DIAGNOSIS — R109 Unspecified abdominal pain: Secondary | ICD-10-CM | POA: Diagnosis not present

## 2019-09-05 DIAGNOSIS — R1030 Lower abdominal pain, unspecified: Secondary | ICD-10-CM | POA: Diagnosis not present

## 2019-09-05 MED ORDER — IOHEXOL 300 MG/ML  SOLN
100.0000 mL | Freq: Once | INTRAMUSCULAR | Status: AC | PRN
  Administered 2019-09-05: 13:00:00 100 mL via INTRAVENOUS

## 2019-09-07 ENCOUNTER — Telehealth (INDEPENDENT_AMBULATORY_CARE_PROVIDER_SITE_OTHER): Payer: Self-pay | Admitting: Nurse Practitioner

## 2019-09-07 ENCOUNTER — Telehealth: Payer: Self-pay | Admitting: Nurse Practitioner

## 2019-09-07 NOTE — Telephone Encounter (Signed)
Ann, please fax a copy of pt's abd/pelvic CT to his urologist, Dr. Jeffie Pollock. thx

## 2019-09-07 NOTE — Telephone Encounter (Signed)
Tammy, pls call patient's daughter, [name], and let her know I communicated with her father's oncologist Dr. Worthy Keeler regarding pt's abd/pelvic CT results which shows prostate enlarged when compared to prior CT and Dr. Worthy Keeler advised pt to follow up with his urologist Dr. Jeffie Pollock. thx

## 2019-09-07 NOTE — Telephone Encounter (Signed)
report faxed to PCP  

## 2019-09-13 DIAGNOSIS — G894 Chronic pain syndrome: Secondary | ICD-10-CM | POA: Diagnosis not present

## 2019-09-13 DIAGNOSIS — M5416 Radiculopathy, lumbar region: Secondary | ICD-10-CM | POA: Diagnosis not present

## 2019-09-13 DIAGNOSIS — G8928 Other chronic postprocedural pain: Secondary | ICD-10-CM | POA: Diagnosis not present

## 2019-09-13 DIAGNOSIS — G992 Myelopathy in diseases classified elsewhere: Secondary | ICD-10-CM | POA: Diagnosis not present

## 2019-09-13 DIAGNOSIS — C61 Malignant neoplasm of prostate: Secondary | ICD-10-CM | POA: Diagnosis not present

## 2019-09-13 NOTE — Telephone Encounter (Signed)
Patient's daughter was called and made aware of the recommendation given by Dr. Worthy Keeler.

## 2019-09-14 MED FILL — XTANDI 40 MG CAPSULE: 40 | 30 days supply | Qty: 120 | Fill #1

## 2019-09-18 DIAGNOSIS — Z299 Encounter for prophylactic measures, unspecified: Secondary | ICD-10-CM | POA: Diagnosis not present

## 2019-09-18 DIAGNOSIS — I4891 Unspecified atrial fibrillation: Secondary | ICD-10-CM | POA: Diagnosis not present

## 2019-09-18 DIAGNOSIS — S79911A Unspecified injury of right hip, initial encounter: Secondary | ICD-10-CM | POA: Diagnosis not present

## 2019-09-18 DIAGNOSIS — Z6827 Body mass index (BMI) 27.0-27.9, adult: Secondary | ICD-10-CM | POA: Diagnosis not present

## 2019-09-18 DIAGNOSIS — Z9359 Other cystostomy status: Secondary | ICD-10-CM | POA: Diagnosis not present

## 2019-09-18 DIAGNOSIS — M25551 Pain in right hip: Secondary | ICD-10-CM | POA: Diagnosis not present

## 2019-09-19 ENCOUNTER — Ambulatory Visit (INDEPENDENT_AMBULATORY_CARE_PROVIDER_SITE_OTHER): Payer: Medicare HMO | Admitting: Nurse Practitioner

## 2019-09-20 ENCOUNTER — Ambulatory Visit (INDEPENDENT_AMBULATORY_CARE_PROVIDER_SITE_OTHER): Payer: Medicare HMO | Admitting: Nurse Practitioner

## 2019-09-21 ENCOUNTER — Other Ambulatory Visit: Payer: Self-pay

## 2019-09-21 ENCOUNTER — Inpatient Hospital Stay (HOSPITAL_COMMUNITY): Payer: Medicare HMO

## 2019-09-21 ENCOUNTER — Inpatient Hospital Stay (HOSPITAL_COMMUNITY): Payer: Medicare HMO | Attending: Hematology

## 2019-09-21 ENCOUNTER — Encounter (HOSPITAL_COMMUNITY): Payer: Self-pay

## 2019-09-21 VITALS — BP 110/68 | HR 60 | Temp 97.9°F | Resp 16

## 2019-09-21 DIAGNOSIS — Z79899 Other long term (current) drug therapy: Secondary | ICD-10-CM | POA: Diagnosis not present

## 2019-09-21 DIAGNOSIS — R102 Pelvic and perineal pain: Secondary | ICD-10-CM | POA: Insufficient documentation

## 2019-09-21 DIAGNOSIS — Z87891 Personal history of nicotine dependence: Secondary | ICD-10-CM | POA: Insufficient documentation

## 2019-09-21 DIAGNOSIS — C7951 Secondary malignant neoplasm of bone: Secondary | ICD-10-CM | POA: Diagnosis not present

## 2019-09-21 DIAGNOSIS — C61 Malignant neoplasm of prostate: Secondary | ICD-10-CM

## 2019-09-21 DIAGNOSIS — C778 Secondary and unspecified malignant neoplasm of lymph nodes of multiple regions: Secondary | ICD-10-CM | POA: Diagnosis not present

## 2019-09-21 DIAGNOSIS — G479 Sleep disorder, unspecified: Secondary | ICD-10-CM | POA: Diagnosis not present

## 2019-09-21 DIAGNOSIS — R200 Anesthesia of skin: Secondary | ICD-10-CM | POA: Insufficient documentation

## 2019-09-21 DIAGNOSIS — I480 Paroxysmal atrial fibrillation: Secondary | ICD-10-CM | POA: Insufficient documentation

## 2019-09-21 DIAGNOSIS — Z8249 Family history of ischemic heart disease and other diseases of the circulatory system: Secondary | ICD-10-CM | POA: Insufficient documentation

## 2019-09-21 DIAGNOSIS — R319 Hematuria, unspecified: Secondary | ICD-10-CM | POA: Diagnosis not present

## 2019-09-21 DIAGNOSIS — Z7901 Long term (current) use of anticoagulants: Secondary | ICD-10-CM | POA: Insufficient documentation

## 2019-09-21 LAB — COMPREHENSIVE METABOLIC PANEL
ALT: 9 U/L (ref 0–44)
AST: 11 U/L — ABNORMAL LOW (ref 15–41)
Albumin: 3.2 g/dL — ABNORMAL LOW (ref 3.5–5.0)
Alkaline Phosphatase: 47 U/L (ref 38–126)
Anion gap: 7 (ref 5–15)
BUN: 16 mg/dL (ref 8–23)
CO2: 25 mmol/L (ref 22–32)
Calcium: 8.4 mg/dL — ABNORMAL LOW (ref 8.9–10.3)
Chloride: 104 mmol/L (ref 98–111)
Creatinine, Ser: 0.74 mg/dL (ref 0.61–1.24)
GFR calc Af Amer: >60 mL/min (ref >60)
GFR calc non Af Amer: >60 mL/min (ref >60)
Glucose, Bld: 109 mg/dL — ABNORMAL HIGH (ref 70–99)
Potassium: 4.1 mmol/L (ref 3.5–5.1)
Sodium: 136 mmol/L (ref 135–145)
Total Bilirubin: 1 mg/dL (ref 0.3–1.2)
Total Protein: 6.7 g/dL (ref 6.5–8.1)

## 2019-09-21 MED ORDER — DENOSUMAB 120 MG/1.7ML ~~LOC~~ SOLN
120.0000 mg | Freq: Once | SUBCUTANEOUS | Status: AC
  Administered 2019-09-21: 120 mg via SUBCUTANEOUS

## 2019-09-21 MED ORDER — DENOSUMAB 120 MG/1.7ML ~~LOC~~ SOLN
SUBCUTANEOUS | Status: AC
  Filled 2019-09-21: qty 1.7

## 2019-09-21 NOTE — Progress Notes (Signed)
Taking calcium as directed.  Denied tooth, jaw, and leg pain.  No recent dental visits.  Family at side.  No s/s of distress noted.   Patient tolerated injection with no complaints voiced.  Site clean and dry with no bruising or swelling noted at site.  Band aid applied.  Vss with discharge and left ambulatory with no s/s of distress noted.

## 2019-09-27 ENCOUNTER — Ambulatory Visit (INDEPENDENT_AMBULATORY_CARE_PROVIDER_SITE_OTHER): Payer: Medicare HMO | Admitting: Urology

## 2019-09-27 ENCOUNTER — Other Ambulatory Visit (HOSPITAL_COMMUNITY)
Admission: RE | Admit: 2019-09-27 | Discharge: 2019-09-27 | Disposition: A | Discharge status: Home / Self Care | Payer: Medicare HMO | Source: Ambulatory Visit | Attending: Urology | Admitting: Urology

## 2019-09-27 DIAGNOSIS — R338 Other retention of urine: Secondary | ICD-10-CM

## 2019-09-27 DIAGNOSIS — R4182 Altered mental status, unspecified: Secondary | ICD-10-CM | POA: Insufficient documentation

## 2019-09-27 DIAGNOSIS — I10 Essential (primary) hypertension: Secondary | ICD-10-CM | POA: Diagnosis not present

## 2019-09-27 DIAGNOSIS — M159 Polyosteoarthritis, unspecified: Secondary | ICD-10-CM | POA: Diagnosis not present

## 2019-09-27 DIAGNOSIS — I2510 Atherosclerotic heart disease of native coronary artery without angina pectoris: Secondary | ICD-10-CM | POA: Diagnosis not present

## 2019-09-30 LAB — URINE CULTURE: Culture: 80000 — AB

## 2019-10-03 ENCOUNTER — Encounter (HOSPITAL_COMMUNITY): Payer: Self-pay

## 2019-10-03 ENCOUNTER — Other Ambulatory Visit: Payer: Self-pay

## 2019-10-03 ENCOUNTER — Emergency Department (HOSPITAL_COMMUNITY)
Admission: EM | Admit: 2019-10-03 | Discharge: 2019-10-03 | Disposition: A | Discharge status: Home / Self Care | Payer: Medicare HMO | Attending: Emergency Medicine | Admitting: Emergency Medicine

## 2019-10-03 ENCOUNTER — Encounter (HOSPITAL_COMMUNITY): Payer: Self-pay | Admitting: Emergency Medicine

## 2019-10-03 DIAGNOSIS — Z79899 Other long term (current) drug therapy: Secondary | ICD-10-CM | POA: Insufficient documentation

## 2019-10-03 DIAGNOSIS — Z8673 Personal history of transient ischemic attack (TIA), and cerebral infarction without residual deficits: Secondary | ICD-10-CM | POA: Insufficient documentation

## 2019-10-03 DIAGNOSIS — R52 Pain, unspecified: Secondary | ICD-10-CM | POA: Diagnosis not present

## 2019-10-03 DIAGNOSIS — Z923 Personal history of irradiation: Secondary | ICD-10-CM | POA: Insufficient documentation

## 2019-10-03 DIAGNOSIS — Z87891 Personal history of nicotine dependence: Secondary | ICD-10-CM | POA: Insufficient documentation

## 2019-10-03 DIAGNOSIS — Z960 Presence of urogenital implants: Secondary | ICD-10-CM | POA: Insufficient documentation

## 2019-10-03 DIAGNOSIS — R1032 Left lower quadrant pain: Secondary | ICD-10-CM | POA: Diagnosis not present

## 2019-10-03 DIAGNOSIS — Z951 Presence of aortocoronary bypass graft: Secondary | ICD-10-CM | POA: Diagnosis not present

## 2019-10-03 DIAGNOSIS — Z7901 Long term (current) use of anticoagulants: Secondary | ICD-10-CM | POA: Insufficient documentation

## 2019-10-03 DIAGNOSIS — Z8546 Personal history of malignant neoplasm of prostate: Secondary | ICD-10-CM | POA: Diagnosis not present

## 2019-10-03 DIAGNOSIS — Z85828 Personal history of other malignant neoplasm of skin: Secondary | ICD-10-CM | POA: Diagnosis not present

## 2019-10-03 DIAGNOSIS — R1031 Right lower quadrant pain: Secondary | ICD-10-CM | POA: Insufficient documentation

## 2019-10-03 DIAGNOSIS — I10 Essential (primary) hypertension: Secondary | ICD-10-CM | POA: Diagnosis not present

## 2019-10-03 DIAGNOSIS — R310 Gross hematuria: Secondary | ICD-10-CM | POA: Insufficient documentation

## 2019-10-03 DIAGNOSIS — I252 Old myocardial infarction: Secondary | ICD-10-CM | POA: Diagnosis not present

## 2019-10-03 DIAGNOSIS — R319 Hematuria, unspecified: Secondary | ICD-10-CM | POA: Diagnosis not present

## 2019-10-03 LAB — URINALYSIS, ROUTINE W REFLEX MICROSCOPIC

## 2019-10-03 LAB — BASIC METABOLIC PANEL
Anion gap: 4 — ABNORMAL LOW (ref 5–15)
BUN: 11 mg/dL (ref 8–23)
CO2: 21 mmol/L — ABNORMAL LOW (ref 22–32)
Calcium: 8 mg/dL — ABNORMAL LOW (ref 8.9–10.3)
Chloride: 108 mmol/L (ref 98–111)
Creatinine, Ser: 0.77 mg/dL (ref 0.61–1.24)
GFR calc Af Amer: >60 mL/min (ref >60)
GFR calc non Af Amer: >60 mL/min (ref >60)
Glucose, Bld: 156 mg/dL — ABNORMAL HIGH (ref 70–99)
Potassium: 3.2 mmol/L — ABNORMAL LOW (ref 3.5–5.1)
Sodium: 133 mmol/L — ABNORMAL LOW (ref 135–145)

## 2019-10-03 LAB — PROTIME-INR
INR: 1.4 — ABNORMAL HIGH (ref 0.8–1.2)
Prothrombin Time: 17 seconds — ABNORMAL HIGH (ref 11.4–15.2)

## 2019-10-03 LAB — CBC WITH DIFFERENTIAL/PLATELET
Abs Immature Granulocytes: 0.02 10*3/uL (ref 0.00–0.07)
Basophils Absolute: 0 10*3/uL (ref 0.0–0.1)
Basophils Relative: 1 %
Eosinophils Absolute: 0.4 10*3/uL (ref 0.0–0.5)
Eosinophils Relative: 6 %
HCT: 35.8 % — ABNORMAL LOW (ref 39.0–52.0)
Hemoglobin: 11.1 g/dL — ABNORMAL LOW (ref 13.0–17.0)
Immature Granulocytes: 0 %
Lymphocytes Relative: 19 %
Lymphs Abs: 1.4 10*3/uL (ref 0.7–4.0)
MCH: 28 pg (ref 26.0–34.0)
MCHC: 31 g/dL (ref 30.0–36.0)
MCV: 90.2 fL (ref 80.0–100.0)
Monocytes Absolute: 0.4 10*3/uL (ref 0.1–1.0)
Monocytes Relative: 5 %
Neutro Abs: 5 10*3/uL (ref 1.7–7.7)
Neutrophils Relative %: 69 %
Platelets: 347 10*3/uL (ref 150–400)
RBC: 3.97 MIL/uL — ABNORMAL LOW (ref 4.22–5.81)
RDW: 14.9 % (ref 11.5–15.5)
WBC: 7.3 10*3/uL (ref 4.0–10.5)
nRBC: 0 % (ref 0.0–0.2)

## 2019-10-03 LAB — URINALYSIS, MICROSCOPIC (REFLEX): RBC / HPF: >50 RBC/hpf (ref 0–5)

## 2019-10-03 LAB — TYPE AND SCREEN
ABO/RH(D): A POS
Antibody Screen: NEGATIVE

## 2019-10-03 MED ORDER — ACETAMINOPHEN 325 MG PO TABS
650.0000 mg | ORAL_TABLET | Freq: Once | ORAL | Status: AC
  Administered 2019-10-03: 650 mg via ORAL
  Filled 2019-10-03: qty 2

## 2019-10-03 MED ORDER — TRAMADOL HCL 50 MG PO TABS
50.0000 mg | ORAL_TABLET | Freq: Once | ORAL | Status: AC
  Administered 2019-10-03: 50 mg via ORAL
  Filled 2019-10-03: qty 1

## 2019-10-03 NOTE — ED Provider Notes (Signed)
[facility] EMERGENCY DEPARTMENT Provider Note   CSN: [id number] Arrival date & time: 10/03/19  1742     History   Chief Complaint Chief Complaint  Patient presents with  . Hematuria    HPI Elijah Santana is a 85 y.o. male.     HPI  85 year old male comes in a chief complaint of bloody urine. Patient has history of CAD, stroke, paroxysmal A. fib on Eliquis and prostate cancer with suprapubic catheter that has been in place for at least 6 months now.  Patient reports that his catheter was exchanged about a week ago and ever since he has been having gross hematuria.  He is not been able to get in touch with his urologist or his cancer doctor.  His symptoms have persisted and he decided to come to the ER for further evaluation.  He is having some abdominal pain and back pain but is not sure whether the pain started before or after the hematuria.  His pain is in the lower abdominal quadrants.  No flank pain.  Patient denies any fevers, chills but he does have some weakness.  Past Medical History:  Diagnosis Date  . Arthritis   . Asthma    Childhood  . Chronic back pain   . Coronary atherosclerosis of native coronary artery    a. CABG 2004 with LIMA-LAD, SVG-D1, SVG-OM, SVG-PDA. b. Cath ~2010 with occ of SVG-diagonal, distal LAD and OM filiing by collaterals. c. NSTEMI 12/2014 s/p DES to SVG-RCA. d. 11/2015: DES to distal graft of the SVG-distal RCA  . Essential hypertension   . Foley catheter in place   . GERD (gastroesophageal reflux disease)   . Hard of hearing   . Hyperglycemia   . Ischemic cardiomyopathy    a. EF 45% by cath 12/2014.  . Mixed hyperlipidemia   . NSTEMI (non-ST elevated myocardial infarction) ([place]) 01/01/15  . Paroxysmal atrial fibrillation (H[facility])   . Pneumonia    hx of   . Prostate cancer [facility])    Radiation therapy 1996  . Skin cancer   . Stroke [facility])    TIA- 28 years ago     Patient Active Problem List   Diagnosis Date Noted  . Rectal pain  08/08/2019  . Lower abdominal pain 08/08/2019  . Urinary retention 03/24/2019  . Bilateral hydronephrosis 03/24/2019  . Uncontrolled hypertension 03/24/2019  . Acute lower UTI 03/24/2019  . Anemia 03/24/2019  . Bone metastasis ([place]) 04/08/2018  . Prostate cancer ([place]) 11/27/2017  . Atrial fibrillation, rapid -new 09/10/16 09/10/2016  . PAF- in setting of NSTEMI 09/10/2016  . Accelerating angina ([place])   . CAD S/P percutaneous coronary angioplasty   . Coronary artery disease with hx of myocardial infarct w/o hx of CABG 03/21/2015  . Ischemic cardiomyopathy   . Hyperglycemia   . Essential hypertension   . BPH (benign prostatic hyperplasia)   . NSTEMI- Troponin peak 6/47 12/31/2014  . S/P CABG x 4 2004 12/31/2014  . GERD (gastroesophageal reflux disease) 04/29/2012  . Mixed hyperlipidemia   . Essential hypertension, benign 08/06/2009    Past Surgical History:  Procedure Laterality Date  . CARDIAC CATHETERIZATION  01/01/2015   Procedure: CORONARY STENT INTERVENTION;  Surgeon: Peter M Martinique, MD;  Location: [facility] CATH LAB;  Service: Cardiovascular;;  SVG to PDA  . CARDIAC CATHETERIZATION N/A 11/21/2015   Procedure: Left Heart Cath and Cors/Grafts Angiography;  Surgeon: Troy Sine, MD;  Location: [place] CV LAB;  Service: Cardiovascular;  Laterality: N/A;  .  CARDIAC CATHETERIZATION N/A 11/21/2015   Procedure: Coronary Stent Intervention;  Surgeon: Troy Sine, MD;  Location: [place] CV LAB;  Service: Cardiovascular;  Laterality: N/A;  . CARDIAC CATHETERIZATION N/A 09/11/2016   Procedure: Left Heart Cath and Cors/Grafts Angiography;  Surgeon: Leonie Man, MD;  Location: [place] CV LAB;  Service: Cardiovascular;  Laterality: N/A;  . CARDIAC CATHETERIZATION N/A 09/11/2016   Procedure: Coronary Stent Intervention;  Surgeon: Leonie Man, MD;  Location: [place] CV LAB;  Service: Cardiovascular;  Laterality: N/A;  . CORONARY ARTERY BYPASS GRAFT  2004   LIMA to LAD, SVG  to diagonal, SVG to circumflex, SVG to PDA  . CYSTOSCOPY WITH URETHRAL DILATATION N/A 03/28/2019   Procedure: CYSTOSCOPY WITH URETHRAL  DILATATION OF STRICTURE;  Surgeon: Irine Seal, MD;  Location: [facility] ORS;  Service: Urology;  Laterality: N/A;  . GREEN LIGHT LASER TURP (TRANSURETHRAL RESECTION OF PROSTATE N/A 06/04/2016   Procedure: GREEN LIGHT LASER TURP (TRANSURETHRAL RESECTION OF PROSTATE;  Surgeon: Irine Seal, MD;  Location: [facility] ORS;  Service: Urology;  Laterality: N/A;  . LEFT HEART CATHETERIZATION WITH CORONARY/GRAFT ANGIOGRAM N/A 01/01/2015   Procedure: LEFT HEART CATHETERIZATION WITH Beatrix Fetters;  Surgeon: Peter M Martinique, MD;  Location: [facility] CATH LAB;  Service: Cardiovascular;  Laterality: N/A;  . PERCUTANEOUS CORONARY STENT INTERVENTION (PCI-S)  11/20/2015   distal SVG  with DES       . TONSILLECTOMY          Home Medications    Prior to Admission medications   Medication Sig Start Date End Date Taking? Authorizing Provider  acetaminophen (TYLENOL) 325 MG tablet Take 2 tablets (650 mg total) by mouth every 4 (four) hours as needed for headache or mild pain. 09/12/16  Yes Kilroy, Luke K, PA-C  amLODipine (NORVASC) 5 MG tablet TAKE ONE TABLET BY MOUTH DAILY (MORNING) Patient taking differently: Take 5 mg by mouth every morning.  11/22/17  Yes Satira Sark, MD  calcium carbonate (OS-CAL - DOSED IN MG OF ELEMENTAL CALCIUM) 1250 (500 Ca) MG tablet Take 1 tablet by mouth daily.    Yes [provider]  DULoxetine (CYMBALTA) 30 MG capsule Take 30 mg by mouth daily. 09/13/19  Yes [provider]  isosorbide mononitrate (IMDUR) 60 MG 24 hr tablet TAKE ONE TABLET BY MOUTH DAILY (MORNING) Patient taking differently: Take 60 mg by mouth every morning.  06/23/19  Yes Satira Sark, MD  linaclotide [facility]) 290 MCG CAPS capsule Take 1 capsule (290 mcg total) by mouth daily before breakfast. 02/23/19  Yes Setzer, Terri L, NP  metoprolol tartrate (LOPRESSOR) 50 MG  tablet Take 1 tablet (50 mg total) by mouth 2 (two) times daily. 06/23/19  Yes Satira Sark, MD  nitroGLYCERIN (NITROSTAT) 0.4 MG SL tablet DISSOLVE ONE TABLET UNDER TONGUE EVERY 5 MINUTES UP TO 3 DOSES AS NEEDED FOR CHEST PAIN Patient taking differently: Place 0.4 mg under the tongue every 5 (five) minutes as needed for chest pain.  03/27/19  Yes Satira Sark, MD  pantoprazole (PROTONIX) 40 MG tablet Take 40 mg by mouth every morning. 09/19/19  Yes [provider]  rosuvastatin (CRESTOR) 20 MG tablet Take 1 tablet (20 mg total) by mouth every evening. 06/23/19  Yes Satira Sark, MD  STOOL SOFTENER 100 MG capsule Take 100-200 mg by mouth daily as needed for mild constipation.  01/02/19  Yes [provider]  TOVIAZ 4 MG TB24 tablet Take 4 mg by mouth daily. 10/02/19  Yes [provider]  traMADol (ULTRAM) 50 MG tablet Take 0.5 tablets (25 mg total) by mouth daily as needed. 12/01/18  Yes Lockamy, Randi L, NP-C  XTANDI 40 MG capsule TAKE 4 CAPSULES (160 MG TOTAL) BY MOUTH DAILY. Patient taking differently: Take 160 mg by mouth daily.  08/07/19  Yes Derek Jack, MD  ciprofloxacin (CIPRO) 250 MG tablet SMARTSIG:1 Tablet(s) By Mouth Every 12 Hours 09/30/19   [provider]  clopidogrel (PLAVIX) 75 MG tablet TAKE ONE TABLET BY MOUTH EVERY DAY (,EVENING) Patient not taking: Reported on 10/03/2019 06/23/19   Satira Sark, MD  denosumab (XGEVA) 120 MG/1.7ML SOLN injection Inject 120 mg into the skin once.     [provider]  ELIQUIS 5 MG TABS tablet Take 1 tablet (5 mg total) by mouth 2 (two) times daily. Patient not taking: Reported on 10/03/2019 06/26/19   Satira Sark, MD  Leuprolide Acetate (LUPRON IJ) Inject as directed every 4 (four) months.     [provider]    Family History Family History  Problem Relation Age of Onset  . Hypertension Father   . Coronary artery disease Father     Social History Social  History   Tobacco Use  . Smoking status: Former Smoker    Packs/day: 0.50    Years: 27.00    Pack years: 13.50    Types: Cigars    Start date: 07/28/1964    Quit date: 07/28/1986    Years since quitting: 33.2  . Smokeless tobacco: Former Systems developer    Types: Chew    Quit date: 08/07/2010  . Tobacco comment: never chewed up over a pack/day  Substance Use Topics  . Alcohol use: No    Alcohol/week: 0.0 standard drinks  . Drug use: No     Allergies   Patient has no known allergies.   Review of Systems Review of Systems  Constitutional: Positive for activity change and fatigue. Negative for chills and fever.  Respiratory: Negative for shortness of breath.   Cardiovascular: Negative for chest pain.  Gastrointestinal: Positive for abdominal pain. Negative for nausea and vomiting.  Genitourinary: Positive for hematuria. Negative for flank pain.  Hematological: Bruises/bleeds easily.  All other systems reviewed and are negative.    Physical Exam Updated Vital Signs BP 131/69 (BP Location: Right Arm)   Pulse 80   Temp 98.4 F (36.9 C) (Oral)   Resp 14   Ht 5\' 8"  (1.727 m)   Wt 79.4 kg   SpO2 98%   BMI 26.61 kg/m   Physical Exam Vitals signs and nursing note reviewed.  Constitutional:      Appearance: He is well-developed.  HENT:     Head: Atraumatic.  Neck:     Musculoskeletal: Neck supple.  Cardiovascular:     Rate and Rhythm: Normal rate.  Pulmonary:     Effort: Pulmonary effort is normal.  Abdominal:     Palpations: Abdomen is soft.     Tenderness: There is no abdominal tenderness.  Genitourinary:    Comments: Suprapubic catheter with grossly bloody urine in the bag.  There is no clots appreciated. Skin:    General: Skin is warm.  Neurological:     Mental Status: He is alert and oriented to person, place, and time.      ED Treatments / Results  Labs (all labs ordered are listed, but only abnormal results are displayed) Labs Reviewed  BASIC METABOLIC  PANEL - Abnormal; Notable for the following components:  Result Value   Sodium 133 (*)    Potassium 3.2 (*)    CO2 21 (*)    Glucose, Bld 156 (*)    Calcium 8.0 (*)    Anion gap 4 (*)    All other components within normal limits  CBC WITH DIFFERENTIAL/PLATELET - Abnormal; Notable for the following components:   RBC 3.97 (*)    Hemoglobin 11.1 (*)    HCT 35.8 (*)    All other components within normal limits  PROTIME-INR - Abnormal; Notable for the following components:   Prothrombin Time 17.0 (*)    INR 1.4 (*)    All other components within normal limits  URINALYSIS, ROUTINE W REFLEX MICROSCOPIC - Abnormal; Notable for the following components:   Color, Urine RED (*)    APPearance TURBID (*)    Glucose, UA   (*)    Value: TEST NOT REPORTED DUE TO COLOR INTERFERENCE OF URINE PIGMENT   Hgb urine dipstick   (*)    Value: TEST NOT REPORTED DUE TO COLOR INTERFERENCE OF URINE PIGMENT   Bilirubin Urine   (*)    Value: TEST NOT REPORTED DUE TO COLOR INTERFERENCE OF URINE PIGMENT   Ketones, ur   (*)    Value: TEST NOT REPORTED DUE TO COLOR INTERFERENCE OF URINE PIGMENT   Protein, ur   (*)    Value: TEST NOT REPORTED DUE TO COLOR INTERFERENCE OF URINE PIGMENT   Nitrite   (*)    Value: TEST NOT REPORTED DUE TO COLOR INTERFERENCE OF URINE PIGMENT   Leukocytes,Ua   (*)    Value: TEST NOT REPORTED DUE TO COLOR INTERFERENCE OF URINE PIGMENT   All other components within normal limits  URINALYSIS, MICROSCOPIC (REFLEX) - Abnormal; Notable for the following components:   Bacteria, UA FEW (*)    All other components within normal limits  URINE CULTURE  TYPE AND SCREEN    EKG None  Radiology No results found.  Procedures Procedures (including critical care time)  Medications Ordered in ED Medications  traMADol (ULTRAM) tablet 50 mg (has no administration in time range)  acetaminophen (TYLENOL) tablet 650 mg (has no administration in time range)     Initial Impression /  Assessment and Plan / ED Course  I have reviewed the triage vital signs and the nursing notes.  Pertinent labs & imaging results that were available during my care of the patient were reviewed by me and considered in my medical decision making (see chart for details).  Clinical Course as of Oct 02 2132  Tue Oct 03, 2019  2131 IMPRESSION of the 10-20 CT scan. 1. The prostate has substantially increased in size compared to CT dated 03/24/2019, now measuring 4.4 x 4.4 cm, previously 3.7 x 3.2 cm when measured similarly (series 2, image 79). Interval enlargement of the prostate is concerning for locally recurrent prostate malignancy and could be better assessed by prostate MRI if desired.  2.  No evidence of abdominal or pelvic lymphadenopathy.  3.  Unchanged sclerotic osseous metastatic lesions.  4. Interval suprapubic catheter decompression of the urinary bladder.  5.  Aortic Atherosclerosis (ICD10-I70.0).   [AN]  2132 Overall UA is reassuring.  Patient is on antibiotics for cystitis.  WBC, UA: 6-10 [AN]  2132 Hemoglobin has dropped slightly compared to August.  Still hemoglobin is over 11 and stable.  No white count elevation is reassuring.  Hemoglobin(!): 11.1 [AN]  2132 Patient is not taking Eliquis at the moment.  INRMarland Kitchen):  1.4 [AN]  2132 Bladder scan revealed no urinary retention.  We were also able to flush the suprapubic catheter without any difficulty.   [AN]  2133 Discussed case with Dr. Gloriann Loan.  He would like for the patient to be seen by urology this week for further diagnostic work-up.   [AN]  2133 The patient appears reasonably screened and/or stabilized for discharge and I doubt any other medical condition or other [facility] requiring further screening, evaluation, or treatment in the ED at this time prior to discharge.  Results from the ER workup discussed with the patient face to face and all questions answered to the best of my ability. The patient is safe for  discharge with strict return precautions.   [AN]    Clinical Course User Index [AN] Varney Biles, MD       85 year old comes in a chief complaint of gross hematuria.  Is also having some weakness, abdominal pain back pain.  He has history of A. fib and is on Eliquis.  Patient also has prostate cancer and the suprapubic catheter is appearing to be permanent.  Symptoms started after replacement of the suprapubic catheter.  Differential diagnosis would include cystitis.  Doubt that there is underlying bladder injury from the catheter replacement since it has been more than 2 weeks now.  Appropriate labs ordered.   Final Clinical Impressions(s) / ED Diagnoses   Final diagnoses:  Gross hematuria    ED Discharge Orders    None       Varney Biles, MD 10/03/19 2133

## 2019-10-03 NOTE — ED Notes (Signed)
Leg bag changed. Will get sample when able.

## 2019-10-03 NOTE — Discharge Instructions (Signed)
You are seen in the ER for bloody urine. Unfortunately, it is not entirely clear to [place] why you are having persistent bleeding. We have spoken with Dr. Gloriann Loan, and they would like for you to be seen in the urology clinic as soon as possible.  We recommend that you follow-up with urology after your oncology appointment tomorrow.  Please call Dr. Purvis Sheffield clinic and see if they can fit you in with Dr. Jeffie Pollock.  If not, one of his partners need to see you this week.  Return to the ER if you stop making urine.

## 2019-10-03 NOTE — ED Triage Notes (Signed)
Pt with hx of prostate cancer, presents with suprapubic catheter reporting hematuria x 10-11 days. Pt reports increase in generalized weakness today.

## 2019-10-04 ENCOUNTER — Inpatient Hospital Stay (HOSPITAL_BASED_OUTPATIENT_CLINIC_OR_DEPARTMENT_OTHER): Payer: Medicare HMO | Admitting: Hematology

## 2019-10-04 ENCOUNTER — Encounter (HOSPITAL_COMMUNITY): Payer: Self-pay | Admitting: Hematology

## 2019-10-04 DIAGNOSIS — C7951 Secondary malignant neoplasm of bone: Secondary | ICD-10-CM | POA: Diagnosis not present

## 2019-10-04 DIAGNOSIS — R102 Pelvic and perineal pain: Secondary | ICD-10-CM | POA: Diagnosis not present

## 2019-10-04 DIAGNOSIS — R200 Anesthesia of skin: Secondary | ICD-10-CM | POA: Diagnosis not present

## 2019-10-04 DIAGNOSIS — C61 Malignant neoplasm of prostate: Secondary | ICD-10-CM

## 2019-10-04 DIAGNOSIS — Z79899 Other long term (current) drug therapy: Secondary | ICD-10-CM | POA: Diagnosis not present

## 2019-10-04 DIAGNOSIS — G479 Sleep disorder, unspecified: Secondary | ICD-10-CM | POA: Diagnosis not present

## 2019-10-04 DIAGNOSIS — R319 Hematuria, unspecified: Secondary | ICD-10-CM | POA: Diagnosis not present

## 2019-10-04 DIAGNOSIS — C778 Secondary and unspecified malignant neoplasm of lymph nodes of multiple regions: Secondary | ICD-10-CM | POA: Diagnosis not present

## 2019-10-04 DIAGNOSIS — I480 Paroxysmal atrial fibrillation: Secondary | ICD-10-CM | POA: Diagnosis not present

## 2019-10-04 NOTE — Assessment & Plan Note (Signed)
1.  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 - Fluciclovine PET CT scan in December 2019 showed lymphadenopathy in the chest and sclerotic lesion in the right ilium, diffuse enhancement of the prostate. - Enzalutamide was started on 02/08/2018 at 80 mg daily, increased to 4 tablets daily in the first week of June 2019. -He receives Lupron injections at Dr. Ralene Muskrat office. -He has intermittent hematuria from the suprapubic catheter.  Most recently he went to the ER last night.  He still has some hematuria.  Eliquis and Plavix are on hold. -His last PSA was 0.1 on 08/24/2019.  He was tolerating enzalutamide very well. -CT of the abdomen and pelvis on 09/05/2019, done for perirectal pain showed prostate substantially increased in size compared to CT scan from May 2020, measuring 4.4 x 4.4 cm, previously 3.7 x 3.2 cm.  This is concerning for locally recurrent prostate malignancy.  No abdominal or pelvic adenopathy.  Unchanged sclerotic lesions on T12 and L1 vertebral bodies.  Right ilium has also 1 lesion.  I have reviewed the films with the patient and his daughter. -He does report pain and is having to take tramadol 50 mg and 1 Tylenol every 5 hours.  -I have communicated with Dr. Jeffie Pollock previously, patient is scheduled for prostate biopsy on 10/20/2019.  Management will change if there is any change in histology of prostate cancer.  It should be noted that prostate increased in size without any change in PSA. -He also has a consultation with [name] for possible palliative radiation this Friday. -I have recommended evaluation by our geneticist for germline mutation testing which will open up options for treatment. -For now he will continue enzalutamide.  I will see him back 4 to 5 days after biopsy.  2.  Bone  metastasis: -He has metastatic sites on right ilium, T12 and L1 vertebral bodies. -Denosumab was started on 05/02/2018.  He is tolerating it well.  He will continue calcium supplements.  3.  Paroxysmal atrial fibrillation: -Eliquis is currently on hold from hematuria yesterday.

## 2019-10-04 NOTE — Progress Notes (Signed)
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   INTERVAL HISTORY:  Mr. Pasqualone 85 y.o. male seen for follow-up of metastatic prostate cancer to the bones.  He is taking enzalutamide 160 mg daily without any major problems.  He had to go to the ER last night with hematuria.  They held his Eliquis and Plavix.  Appetite is 50%.  Energy levels are 25%.  He complains of low back pain and perineal pain, rated as 5-6/10.  He is taking tramadol 50 mg and Tylenol 350 mg every 5 hours to be had of the pain.  Denies any fevers or chills.  His daughter from [place] is accompanying him today.   REVIEW OF SYSTEMS:  Review of Systems  Genitourinary: Positive for hematuria.   Musculoskeletal:       Perineal pain.  Neurological: Positive for numbness.  Psychiatric/Behavioral: Positive for sleep disturbance.  All other systems reviewed and are negative.    PAST MEDICAL/SURGICAL HISTORY:  Past Medical History:  Diagnosis Date  . Arthritis   . Asthma    Childhood  . Chronic back pain   . Coronary atherosclerosis of native coronary artery    a. CABG 2004 with LIMA-LAD, SVG-D1, SVG-OM, SVG-PDA. b. Cath ~2010 with occ of SVG-diagonal, distal LAD and OM filiing by collaterals. c. NSTEMI 12/2014 s/p DES to SVG-RCA. d. 11/2015: DES to distal graft of the SVG-distal RCA  . Essential hypertension   . Foley catheter in place   . GERD (gastroesophageal reflux disease)   . Hard of hearing   . Hyperglycemia   . Ischemic cardiomyopathy    a. EF  45% by cath 12/2014.  . Mixed hyperlipidemia   . NSTEMI (non-ST elevated myocardial infarction) ([place]) 01/01/15  . Paroxysmal atrial fibrillation (H[facility])   . Pneumonia    hx of   . Prostate cancer [facility])    Radiation therapy 1996  . Skin cancer   . Stroke [facility])    TIA- 28 years ago    Past Surgical History:  Procedure Laterality Date  . CARDIAC CATHETERIZATION  01/01/2015   Procedure: CORONARY STENT INTERVENTION;  Surgeon: Peter M Martinique, MD;  Location: [facility] CATH LAB;  Service: Cardiovascular;;  SVG to PDA  . CARDIAC CATHETERIZATION N/A 11/21/2015   Procedure: Left Heart Cath and Cors/Grafts Angiography;  Surgeon: Troy Sine, MD;  Location: [place] [facility] CV LAB;  Service: Cardiovascular;  Laterality: N/A;  . CARDIAC CATHETERIZATION N/A 11/21/2015   Procedure: Coronary Stent Intervention;  Surgeon: Troy Sine, MD;  Location: [place] CV LAB;  Service: Cardiovascular;  Laterality: N/A;  . CARDIAC CATHETERIZATION N/A 09/11/2016   Procedure: Left Heart Cath and Cors/Grafts Angiography;  Surgeon: Leonie Man, MD;  Location: [place] CV LAB;  Service: Cardiovascular;  Laterality: N/A;  . CARDIAC CATHETERIZATION N/A 09/11/2016   Procedure: Coronary Stent Intervention;  Surgeon: Leonie Man, MD;  Location: [place] CV LAB;  Service: Cardiovascular;  Laterality: N/A;  . CORONARY ARTERY BYPASS GRAFT  2004   LIMA  to LAD, SVG to diagonal, SVG to circumflex, SVG to PDA  . CYSTOSCOPY WITH URETHRAL DILATATION N/A 03/28/2019   Procedure: CYSTOSCOPY WITH URETHRAL  DILATATION OF STRICTURE;  Surgeon: Irine Seal, MD;  Location: [facility] ORS;  Service: Urology;  Laterality: N/A;  . GREEN LIGHT LASER TURP (TRANSURETHRAL RESECTION OF PROSTATE N/A 06/04/2016   Procedure: GREEN LIGHT LASER TURP (TRANSURETHRAL RESECTION OF PROSTATE;  Surgeon: Irine Seal, MD;  Location: [facility] ORS;  Service: Urology;  Laterality: N/A;  . LEFT HEART CATHETERIZATION WITH CORONARY/GRAFT ANGIOGRAM N/A 01/01/2015   Procedure: LEFT  HEART CATHETERIZATION WITH Beatrix Fetters;  Surgeon: Peter M Martinique, MD;  Location: [facility] CATH LAB;  Service: Cardiovascular;  Laterality: N/A;  . PERCUTANEOUS CORONARY STENT INTERVENTION (PCI-S)  11/20/2015   distal SVG  with DES       . TONSILLECTOMY       SOCIAL HISTORY:  Social History   Socioeconomic History  . Marital status: Married    Spouse name: Not on file  . Number of children: Not on file  . Years of education: Not on file  . Highest education level: Not on file  Occupational History  . Occupation: Retired    Comment: Office manager  Social Needs  . Financial resource strain: Not on file  . Food insecurity    Worry: Not on file    Inability: Not on file  . Transportation needs    Medical: Not on file    Non-medical: Not on file  Tobacco Use  . Smoking status: Former Smoker    Packs/day: 0.50    Years: 27.00    Pack years: 13.50    Types: Cigars    Start date: 07/28/1964    Quit date: 07/28/1986    Years since quitting: 33.2  . Smokeless tobacco: Former Systems developer    Types: Chew    Quit date: 08/07/2010  . Tobacco comment: never chewed up over a pack/day  Substance and Sexual Activity  . Alcohol use: No    Alcohol/week: 0.0 standard drinks  . Drug use: No  . Sexual activity: Not on file  Lifestyle  . Physical activity    Days per week: Not on file    Minutes per session: Not on file  . Stress: Not on file  Relationships  . Social Herbalist on phone: Not on file    Gets together: Not on file    Attends religious service: Not on file    Active member of club or organization: Not on file    Attends meetings of clubs or organizations: Not on file    Relationship status: Not on file  . Intimate partner violence    Fear of current or ex partner: Not on file    Emotionally abused: Not on file    Physically abused: Not on file    Forced sexual activity: Not on file  Other Topics Concern  . Not on file  Social History Narrative  .  Not on file    FAMILY HISTORY:  Family History  Problem Relation Age of Onset  . Hypertension Father   . Coronary artery disease Father     CURRENT MEDICATIONS:  Outpatient Encounter Medications as of 10/04/2019  Medication Sig Note  . amLODipine (NORVASC) 5 MG tablet TAKE ONE TABLET BY MOUTH DAILY (MORNING) (Patient taking differently: Take 5 mg by mouth every morning. )   . calcium carbonate (OS-CAL - DOSED IN MG OF ELEMENTAL CALCIUM) 1250 (500 Ca) MG  tablet Take 1 tablet by mouth daily.    . clopidogrel (PLAVIX) 75 MG tablet TAKE ONE TABLET BY MOUTH EVERY DAY (,EVENING)   . denosumab (XGEVA) 120 MG/1.7ML SOLN injection Inject 120 mg into the skin once.    Marland Kitchen ELIQUIS 5 MG TABS tablet Take 1 tablet (5 mg total) by mouth 2 (two) times daily.   . isosorbide mononitrate (IMDUR) 60 MG 24 hr tablet TAKE ONE TABLET BY MOUTH DAILY (MORNING) (Patient taking differently: Take 60 mg by mouth every morning. )   . Leuprolide Acetate (LUPRON IJ) Inject as directed every 4 (four) months.  03/26/2019: Administered by the office of Dr. Jeffie Pollock every 4 months   . metoprolol tartrate (LOPRESSOR) 50 MG tablet Take 1 tablet (50 mg total) by mouth 2 (two) times daily.   . pantoprazole (PROTONIX) 40 MG tablet Take 40 mg by mouth every morning.   . rosuvastatin (CRESTOR) 20 MG tablet Take 1 tablet (20 mg total) by mouth every evening.   . TOVIAZ 4 MG TB24 tablet Take 4 mg by mouth daily.   . traMADol (ULTRAM) 50 MG tablet Take 0.5 tablets (25 mg total) by mouth daily as needed.   Gillermina Phy 40 MG capsule TAKE 4 CAPSULES (160 MG TOTAL) BY MOUTH DAILY. (Patient taking differently: Take 160 mg by mouth daily. )   . acetaminophen (TYLENOL) 325 MG tablet Take 2 tablets (650 mg total) by mouth every 4 (four) hours as needed for headache or mild pain. (Patient not taking: Reported on 10/04/2019)   . DULoxetine (CYMBALTA) 30 MG capsule Take 30 mg by mouth daily.   Marland Kitchen linaclotide (LINZESS) 290 MCG CAPS capsule Take 1 capsule  (290 mcg total) by mouth daily before breakfast. (Patient not taking: Reported on 10/04/2019)   . nitroGLYCERIN (NITROSTAT) 0.4 MG SL tablet DISSOLVE ONE TABLET UNDER TONGUE EVERY 5 MINUTES UP TO 3 DOSES AS NEEDED FOR CHEST PAIN (Patient not taking: Reported on 10/04/2019)   . STOOL SOFTENER 100 MG capsule Take 100-200 mg by mouth daily as needed for mild constipation.    . [DISCONTINUED] ciprofloxacin (CIPRO) 250 MG tablet SMARTSIG:1 Tablet(s) By Mouth Every 12 Hours 10/03/2019: 3 day course starting on 09/30/2019   No facility-administered encounter medications on file as of 10/04/2019.     ALLERGIES:  No Known Allergies   PHYSICAL EXAM:  ECOG Performance status: 1  Vitals:   10/04/19 1126  BP: 139/72  Pulse: 71  Resp: 20  Temp: (!) 97.1 F (36.2 C)  SpO2: 96%   Filed Weights   10/04/19 1126  Weight: 178 lb 6.4 oz (80.9 kg)    Physical Exam Vitals signs reviewed.  Constitutional:      Appearance: Normal appearance.  Cardiovascular:     Rate and Rhythm: Normal rate and regular rhythm.     Heart sounds: Normal heart sounds.  Pulmonary:     Effort: Pulmonary effort is normal.     Breath sounds: Normal breath sounds.  Abdominal:     General: There is no distension.     Palpations: Abdomen is soft. There is no mass.  Musculoskeletal:        General: No swelling.  Skin:    General: Skin is warm.  Neurological:     General: No focal deficit present.     Mental Status: He is alert and oriented to person, place, and time.  Psychiatric:        Mood and Affect: Mood normal.  Behavior: Behavior normal.      LABORATORY DATA:  I have reviewed the labs as listed.  CBC    Component Value Date/Time   WBC 7.3 10/03/2019 1800   RBC 3.97 (L) 10/03/2019 1800   HGB 11.1 (L) 10/03/2019 1800   HCT 35.8 (L) 10/03/2019 1800   PLT 347 10/03/2019 1800   MCV 90.2 10/03/2019 1800   MCH 28.0 10/03/2019 1800   MCHC 31.0 10/03/2019 1800   RDW 14.9 10/03/2019 1800    LYMPHSABS 1.4 10/03/2019 1800   MONOABS 0.4 10/03/2019 1800   EOSABS 0.4 10/03/2019 1800   BASOSABS 0.0 10/03/2019 1800   CMP Latest Ref Rng & Units 10/03/2019 09/21/2019 08/24/2019  Glucose 70 - 99 mg/dL 156(H) 109(H) 99  BUN 8 - 23 mg/dL 11 16 12   Creatinine 0.61 - 1.24 mg/dL 0.77 0.74 0.63  Sodium 135 - 145 mmol/L 133(L) 136 140  Potassium 3.5 - 5.1 mmol/L 3.2(L) 4.1 3.9  Chloride 98 - 111 mmol/L 108 104 107  CO2 22 - 32 mmol/L 21(L) 25 25  Calcium 8.9 - 10.3 mg/dL 8.0(L) 8.4(L) 8.3(L)  Total Protein 6.5 - 8.1 g/dL - 6.7 6.8  Total Bilirubin 0.3 - 1.2 mg/dL - 1.0 0.4  Alkaline Phos 38 - 126 U/L - 47 43  AST 15 - 41 U/L - 11(L) 9(L)  ALT 0 - 44 U/L - 9 8       DIAGNOSTIC IMAGING:  I have independently reviewed the scans and discussed with the patient.   I have reviewed Venita Lick LPN's note and agree with the documentation.  I personally performed a face-to-face visit, made revisions and my assessment and plan is as follows.    ASSESSMENT & PLAN:   Prostate cancer ([place]) 1.  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 - Fluciclovine PET CT scan in December 2019 showed lymphadenopathy in the chest and sclerotic lesion in the right ilium, diffuse enhancement of the prostate. - Enzalutamide was started on 02/08/2018 at 80 mg daily, increased to 4 tablets daily in the first week of June 2019. -He receives Lupron injections at Dr. Ralene Muskrat office. -He has intermittent hematuria from the suprapubic catheter.  Most recently he went to the ER last night.  He still has some hematuria.  Eliquis and Plavix are on hold. -His last PSA was 0.1 on 08/24/2019.  He was tolerating enzalutamide very well. -CT of the abdomen and pelvis on 09/05/2019, done for perirectal pain showed prostate substantially  increased in size compared to CT scan from May 2020, measuring 4.4 x 4.4 cm, previously 3.7 x 3.2 cm.  This is concerning for locally recurrent prostate malignancy.  No abdominal or pelvic adenopathy.  Unchanged sclerotic lesions on T12 and L1 vertebral bodies.  Right ilium has also 1 lesion.  I have reviewed the films with the patient and his daughter. -He does report pain and is having to take tramadol 50 mg and 1 Tylenol every 5 hours.  -I have communicated with Dr. Jeffie Pollock previously, patient is scheduled for prostate biopsy on 10/20/2019.  Management will change if there is any change in histology of prostate cancer.  It should be noted that prostate increased in size without any change in PSA. -He also has a consultation with [name] for possible palliative radiation this Friday. -I have recommended evaluation by our geneticist for germline mutation  testing which will open up options for treatment. -For now he will continue enzalutamide.  I will see him back 4 to 5 days after biopsy.  2.  Bone metastasis: -He has metastatic sites on right ilium, T12 and L1 vertebral bodies. -Denosumab was started on 05/02/2018.  He is tolerating it well.  He will continue calcium supplements.  3.  Paroxysmal atrial fibrillation: -Eliquis is currently on hold from hematuria yesterday.  Total time spent is 25 minutes with more than 50% of the time spent face-to-face discussing scan results, reviewing scans with the patient, further plan, counseling and coordination of care.    Orders placed this encounter:  No orders of the defined types were placed in this encounter.     Derek Jack, MD [place] [phone]

## 2019-10-04 NOTE — Patient Instructions (Addendum)
[place] at [facility] Discharge Instructions  You were seen today by [name]. He went over your recent lab results. He will schedule you for genetic counseling. He will see you back after the biopsy for follow up.   Thank you for choosing [place] at [facility] to provide your oncology and hematology care.  To afford each patient quality time with our provider, please arrive at least 15 minutes before your scheduled appointment time.   If you have a lab appointment with the [facility] please come in thru the  Main Entrance and check in at the main information desk  You need to re-schedule your appointment should you arrive 10 or more minutes late.  We strive to give you quality time with our providers, and arriving late affects you and other patients whose appointments are after yours.  Also, if you no show three or more times for appointments you may be dismissed from the clinic at the providers discretion.     Again, thank you for choosing [facility].  Our hope is that these requests will decrease the amount of time that you wait before being seen by our physicians.       _____________________________________________________________  Should you have questions after your visit to [facility], please contact our office at [phone] between the hours of 8:00 a.m. and 4:30 p.m.  Voicemails left after 4:00 p.m. will not be returned until the following business day.  For prescription refill requests, have your pharmacy contact our office and allow 72 hours.    Cancer Center Support Programs:   > Cancer Support Group  2nd Tuesday of the month 1pm-2pm, Journey Room

## 2019-10-05 DIAGNOSIS — Z1331 Encounter for screening for depression: Secondary | ICD-10-CM | POA: Diagnosis not present

## 2019-10-05 DIAGNOSIS — Z299 Encounter for prophylactic measures, unspecified: Secondary | ICD-10-CM | POA: Diagnosis not present

## 2019-10-05 DIAGNOSIS — Z6826 Body mass index (BMI) 26.0-26.9, adult: Secondary | ICD-10-CM | POA: Diagnosis not present

## 2019-10-05 DIAGNOSIS — E7800 Pure hypercholesterolemia, unspecified: Secondary | ICD-10-CM | POA: Diagnosis not present

## 2019-10-05 DIAGNOSIS — Z79899 Other long term (current) drug therapy: Secondary | ICD-10-CM | POA: Diagnosis not present

## 2019-10-05 DIAGNOSIS — R5383 Other fatigue: Secondary | ICD-10-CM | POA: Diagnosis not present

## 2019-10-05 DIAGNOSIS — Z1339 Encounter for screening examination for other mental health and behavioral disorders: Secondary | ICD-10-CM | POA: Diagnosis not present

## 2019-10-05 DIAGNOSIS — I10 Essential (primary) hypertension: Secondary | ICD-10-CM | POA: Diagnosis not present

## 2019-10-05 DIAGNOSIS — Z1211 Encounter for screening for malignant neoplasm of colon: Secondary | ICD-10-CM | POA: Diagnosis not present

## 2019-10-05 DIAGNOSIS — Z7189 Other specified counseling: Secondary | ICD-10-CM | POA: Diagnosis not present

## 2019-10-05 DIAGNOSIS — Z0000 Encounter for general adult medical examination without abnormal findings: Secondary | ICD-10-CM | POA: Diagnosis not present

## 2019-10-05 LAB — URINE CULTURE: Culture: <10000 — AB

## 2019-10-05 NOTE — Progress Notes (Signed)
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  No abdominal or pelvic adenopathy.  Unchanged sclerotic lesions on T12 and L1 vertebral bodies.  Right ilium has also 1 lesion.  Past/Anticipated chemotherapy by medical oncology, if any:  -Androgen deprivation therapy (Lupron). Last injection given 07/21/2019. -Enzalutamide started 02/08/2018   Patient's main complaints related to symptomatic tumor(s) are: Intermittent hematuria from suprapubic catheter resolved. Began taking Eliquis again today. Nausea mild related to Xtandi.  Pain on a scale of 0-10 is: Reports lower abdominal pain. Reports taking Tramadol 50 mg and 1 Tylenol every 5 hours.     If Spine Met(s), symptoms, if any, include:  Bowel/Bladder retention or incontinence (please describe): Patient has struggled with urinary retention related to urethral stricture. Presently patient has a suprapubic catheter. 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 Wrenn prescribed sulfa antibiotic for management. Scheduled for prostate biopsy on 10/20/19.   Numbness or weakness in extremities (please describe): leg are "a little weak."  Current Decadron regimen, if applicable: denies  Ambulatory status? Walker? Wheelchair?: Uses a cane or walker to get around. Uses wheelchair for long distance. Reports he fell one month ago after tripping over the dog's bed. Understands he tore a ligament with this fall. He was instructed to rest his leg so it would heal.   SAFETY ISSUES:  Prior radiation? Yes, 6500 cGy in 35 tx 09/23/1995-11/12/1995. Under the care of Dr. Murray.  Pacemaker/ICD? no  Possible current pregnancy? No, male patient  Is the patient on methotrexate? no  Additional Complaints / other details:  85 year old male. Widowed with 4 children. Two daughters are living but two sons passed at an early age.  Reports youngest sister died of brain tumor and mother of pancreatic ca diagnosed in her late 70s.  Retired.   Being followed by Dr. Katragadda. Daughter, Karen Prostate biopsy scheduled for 10/20/2019  

## 2019-10-06 ENCOUNTER — Ambulatory Visit
Admission: RE | Admit: 2019-10-06 | Discharge: 2019-10-06 | Disposition: A | Discharge status: Home / Self Care | Payer: Medicare HMO | Source: Ambulatory Visit | Attending: Radiation Oncology | Admitting: Radiation Oncology

## 2019-10-06 ENCOUNTER — Encounter: Payer: Self-pay | Admitting: Radiation Oncology

## 2019-10-06 ENCOUNTER — Other Ambulatory Visit: Payer: Self-pay

## 2019-10-06 VITALS — Ht 68.0 in | Wt 170.0 lb

## 2019-10-06 DIAGNOSIS — Z192 Hormone resistant malignancy status: Secondary | ICD-10-CM | POA: Diagnosis not present

## 2019-10-06 DIAGNOSIS — C61 Malignant neoplasm of prostate: Secondary | ICD-10-CM

## 2019-10-06 DIAGNOSIS — Z923 Personal history of irradiation: Secondary | ICD-10-CM | POA: Diagnosis not present

## 2019-10-06 DIAGNOSIS — C7951 Secondary malignant neoplasm of bone: Secondary | ICD-10-CM | POA: Diagnosis not present

## 2019-10-06 DIAGNOSIS — C779 Secondary and unspecified malignant neoplasm of lymph node, unspecified: Secondary | ICD-10-CM | POA: Diagnosis not present

## 2019-10-06 NOTE — Progress Notes (Signed)
See progress note under physician encounter. 

## 2019-10-06 NOTE — Progress Notes (Signed)
Radiation Oncology         [phone]) [phone] ________________________________  Initial outpatient Consultation - Conducted via Telephone due to current COVID-19 concerns for limiting Elijah exposure  Name: Elijah Santana MRN: [id number]  Date: 10/06/2019  DOB: January 25, 1932  [id number], Costella Hatcher, MD  Irine Seal, MD   REFERRING PHYSICIAN: Irine Seal, MD  DIAGNOSIS: 85 y.o. gentleman with progressive metastatic castration-resistant prostate cancer with rectal pain associated with locally recurrent disease likely invading into the rectal wall and metastases to lymph nodes and bones.    ICD-10-CM   1. Malignant neoplasm of prostate ([place])  C61     HISTORY OF PRESENT ILLNESS: Elijah Santana is a 85 y.o. male with a history of prostate cancer. He was initially diagnosed with prostate cancer in 1996 and was subsequently treated with external beam radiation therapy by Dr. Valere Dross from 09/1995 - 10/1995. There was no evidence of metastatic disease at the time of diagnosis. He later developed a biochemical recurrence, with a PSA of 6.4 in 07/2008 and was treated with casodex until 12/2013 when the PSA again began rising PSA and he was switched to Lupron at that time.  He has continued on Lupron injection for ADT q 4 months since that time and tolerates this well.   He developed worsening urinary obstruction and underwent green light laser TURP on 06/04/2016 under the care and direction of Dr. Jeffie Pollock. There continued to be no evidence of metastatic disease on restaging CT and bone scans.  His PSA increased to 19.9 in November 2018 despite Lupron.  He was then referred to medical oncology at [facility] in 11/2017. PET scan on 12/07/2017 showed intense radiotracer activity within the entirety of the prostate gland, most consistent with prostate cancer recurrence.  There was also intense radiotracer activity associated with small bilateral hilar lymph nodes and lymph nodes along the descending aorta with the  largest in the right hilum and intense radiotracer activity associated with a sclerotic right iliac bone lesion. He was subsequently started on Xtandi in 01/2018, under the care and direction of [name], which resulted in vast improvement in PSA.  His PSA decreased to 0.17 in May 2020, 0.25 in June 2020 and most recent PSA on 08/24/2019 was further decreased at 0.10.  He has had intermittent episodes of urinary retention over the years and more recently required placement of an SP tube in May 2020.  Unfortunately, he recently developed intermittent hematuria from his suprapubic catheter, as well as abdominal and rectal pain. He underwent CT A/P on 09/05/2019, which showed a substantial increase in size of the prostate, now measuring 4.4 cm and concerning for locally recurrent prostate malignancy but no evidence of abdominal or pelvic lymphadenopathy.  Additionally, there were stable dense, sclerotic osseous lesions of the right ilium, T12 and L1 vertebral bodies, as compared to prior imaging from 03/2019.  He is scheduled for prostate biopsy on 10/20/2019 for tissue confirmation and to help further guide treatment management.  [name] has also referred him for a genetic consult next week to assess for any genetic mutations that might be ameanable to targeted systemic therapy.  The Elijah has kindly been referred today for discussion of potential palliative radiation treatment options for management of the rectal pain/discomfort.  His daughter, Elijah Santana, is accompanying him on the telephone today.   PREVIOUS RADIATION THERAPY: Yes  09/23/1995 - 11/12/1995: Prostate / 65 Gy in 35 fractions (Dr. Valere Dross)  PAST MEDICAL HISTORY:  Past Medical History:  Diagnosis Date   Arthritis    Asthma    Childhood   Chronic back pain    Coronary atherosclerosis of native coronary artery    a. CABG 2004 with LIMA-LAD, SVG-D1, SVG-OM, SVG-PDA. b. Cath ~2010 with occ of SVG-diagonal, distal LAD and OM  filiing by collaterals. c. NSTEMI 12/2014 s/p DES to SVG-RCA. d. 11/2015: DES to distal graft of the SVG-distal RCA   Essential hypertension    Foley catheter in place    GERD (gastroesophageal reflux disease)    Hard of hearing    Hyperglycemia    Ischemic cardiomyopathy    a. EF 45% by cath 12/2014.   Mixed hyperlipidemia    NSTEMI (non-ST elevated myocardial infarction) ([place]) 01/01/15   Paroxysmal atrial fibrillation ([facility])    Pneumonia    hx of    Prostate cancer ([place])    Radiation therapy 1996   Skin cancer    Stroke [facility])    TIA- 28 years ago       PAST SURGICAL HISTORY: Past Surgical History:  Procedure Laterality Date   CARDIAC CATHETERIZATION  01/01/2015   Procedure: CORONARY STENT INTERVENTION;  Surgeon: Peter M Martinique, MD;  Location: [facility] CATH LAB;  Service: Cardiovascular;;  SVG to PDA   CARDIAC CATHETERIZATION N/A 11/21/2015   Procedure: Left Heart Cath and Cors/Grafts Angiography;  Surgeon: Troy Sine, MD;  Location: [facility] CV LAB;  Service: Cardiovascular;  Laterality: N/A;   CARDIAC CATHETERIZATION N/A 11/21/2015   Procedure: Coronary Stent Intervention;  Surgeon: Troy Sine, MD;  Location: [facility] CV LAB;  Service: Cardiovascular;  Laterality: N/A;   CARDIAC CATHETERIZATION N/A 09/11/2016   Procedure: Left Heart Cath and Cors/Grafts Angiography;  Surgeon: Leonie Man, MD;  Location: [facility] CV LAB;  Service: Cardiovascular;  Laterality: N/A;   CARDIAC CATHETERIZATION N/A 09/11/2016   Procedure: Coronary Stent Intervention;  Surgeon: Leonie Man, MD;  Location: [facility] CV LAB;  Service: Cardiovascular;  Laterality: N/A;   CORONARY ARTERY BYPASS GRAFT  2004   LIMA to LAD, SVG to diagonal, SVG to circumflex, SVG to PDA   CYSTOSCOPY WITH URETHRAL DILATATION N/A 03/28/2019   Procedure: CYSTOSCOPY WITH URETHRAL  DILATATION OF STRICTURE;  Surgeon: Irine Seal, MD;  Location: [facility] ORS;  Service: Urology;  Laterality: N/A;   GREEN  LIGHT LASER TURP (TRANSURETHRAL RESECTION OF PROSTATE N/A 06/04/2016   Procedure: GREEN LIGHT LASER TURP (TRANSURETHRAL RESECTION OF PROSTATE;  Surgeon: Irine Seal, MD;  Location: [facility] ORS;  Service: Urology;  Laterality: N/A;   LEFT HEART CATHETERIZATION WITH CORONARY/GRAFT ANGIOGRAM N/A 01/01/2015   Procedure: LEFT HEART CATHETERIZATION WITH Beatrix Fetters;  Surgeon: Peter M Martinique, MD;  Location: [facility] CATH LAB;  Service: Cardiovascular;  Laterality: N/A;   PERCUTANEOUS CORONARY STENT INTERVENTION (PCI-S)  11/20/2015   distal SVG  with DES        TONSILLECTOMY      FAMILY HISTORY:  Family History  Problem Relation Age of Onset   Hypertension Father    Coronary artery disease Father    Pancreatic cancer Mother 91   Brain cancer Sister     SOCIAL HISTORY:  Social History   Socioeconomic History   Marital status: Widowed    Spouse name: Not on file   Number of children: 2   Years of education: Not on file   Highest education level: Not on file  Occupational History   Occupation: Retired    Comment: Office manager  Social Needs  Financial resource strain: Not on file   Food insecurity    Worry: Not on file    Inability: Not on file   Transportation needs    Medical: Not on file    Non-medical: Not on file  Tobacco Use   Smoking status: Former Smoker    Packs/day: 0.50    Years: 27.00    Pack years: 13.50    Types: Cigars    Start date: 07/28/1964    Quit date: 07/28/1986    Years since quitting: 33.2   Smokeless tobacco: Former Systems developer    Types: Chew    Quit date: 08/07/2010   Tobacco comment: never chewed up over a pack/day  Substance and Sexual Activity   Alcohol use: No    Alcohol/week: 0.0 standard drinks   Drug use: No   Sexual activity: Not Currently  Lifestyle   Physical activity    Days per week: Not on file    Minutes per session: Not on file   Stress: Not on file  Relationships   Social connections    Talks on  phone: Not on file    Gets together: Not on file    Attends religious service: Not on file    Active member of club or organization: Not on file    Attends meetings of clubs or organizations: Not on file    Relationship status: Not on file   Intimate partner violence    Fear of current or ex partner: Not on file    Emotionally abused: Not on file    Physically abused: Not on file    Forced sexual activity: Not on file  Other Topics Concern   Not on file  Social History Narrative   Not on file    ALLERGIES: Elijah has no known allergies.  MEDICATIONS:  Current Outpatient Medications  Medication Sig Dispense Refill   acetaminophen (TYLENOL) 325 MG tablet Take 2 tablets (650 mg total) by mouth every 4 (four) hours as needed for headache or mild pain. (Elijah not taking: Reported on 10/04/2019)     amLODipine (NORVASC) 5 MG tablet TAKE ONE TABLET BY MOUTH DAILY (MORNING) (Elijah taking differently: Take 5 mg by mouth every morning. ) 30 tablet 3   calcium carbonate (OS-CAL - DOSED IN MG OF ELEMENTAL CALCIUM) 1250 (500 Ca) MG tablet Take 1 tablet by mouth daily.      clopidogrel (PLAVIX) 75 MG tablet TAKE ONE TABLET BY MOUTH EVERY DAY (,EVENING) 30 tablet 6   denosumab (XGEVA) 120 MG/1.7ML SOLN injection Inject 120 mg into the skin once.      DULoxetine (CYMBALTA) 30 MG capsule Take 30 mg by mouth daily.     ELIQUIS 5 MG TABS tablet Take 1 tablet (5 mg total) by mouth 2 (two) times daily. 60 tablet 9   isosorbide mononitrate (IMDUR) 60 MG 24 hr tablet TAKE ONE TABLET BY MOUTH DAILY (MORNING) (Elijah taking differently: Take 60 mg by mouth every morning. ) 90 tablet 3   Leuprolide Acetate (LUPRON IJ) Inject as directed every 4 (four) months.      linaclotide (LINZESS) 290 MCG CAPS capsule Take 1 capsule (290 mcg total) by mouth daily before breakfast. (Elijah not taking: Reported on 10/04/2019) 30 capsule 3   metoprolol tartrate (LOPRESSOR) 50 MG tablet Take 1 tablet (50  mg total) by mouth 2 (two) times daily. 180 tablet 3   nitroGLYCERIN (NITROSTAT) 0.4 MG SL tablet DISSOLVE ONE TABLET UNDER TONGUE EVERY 5 MINUTES UP TO 3  DOSES AS NEEDED FOR CHEST PAIN (Elijah not taking: Reported on 10/04/2019) 25 tablet 3   pantoprazole (PROTONIX) 40 MG tablet Take 40 mg by mouth every morning.     rosuvastatin (CRESTOR) 20 MG tablet Take 1 tablet (20 mg total) by mouth every evening. 90 tablet 3   STOOL SOFTENER 100 MG capsule Take 100-200 mg by mouth daily as needed for mild constipation.      TOVIAZ 4 MG TB24 tablet Take 4 mg by mouth daily.     traMADol (ULTRAM) 50 MG tablet Take 0.5 tablets (25 mg total) by mouth daily as needed. 30 tablet 0   XTANDI 40 MG capsule TAKE 4 CAPSULES (160 MG TOTAL) BY MOUTH DAILY. (Elijah taking differently: Take 160 mg by mouth daily. ) 120 capsule 3   No current facility-administered medications for this encounter.     REVIEW OF SYSTEMS:  On review of systems, the Elijah reports that he is doing well overall. He denies any chest pain, shortness of breath, cough, fevers, chills, night sweats, unintended weight changes. He denies any bowel disturbances, and denies abdominal pain, nausea or vomiting. He reports continued lower abdominal pain. He also reports struggling with urinary retention related to urethral stricture and presently has a suprapubic catheter in place. Of note, the Elijah also has MRSA in his urine and was prescribed an antibiotic for this. He reports his legs are "a little weak." He reports he fell about a month ago and tore a ligament. A complete review of systems is obtained and is otherwise negative.    PHYSICAL EXAM:  Wt Readings from Last 3 Encounters:  10/06/19 170 lb (77.1 kg)  10/04/19 178 lb 6.4 oz (80.9 kg)  10/03/19 175 lb (79.4 kg)   Temp Readings from Last 3 Encounters:  10/04/19 (!) 97.1 F (36.2 C) (Temporal)  10/03/19 98.4 F (36.9 C) (Oral)  09/21/19 97.9 F (36.6 C) (Temporal)   BP  Readings from Last 3 Encounters:  10/04/19 139/72  10/03/19 (!) 153/66  09/21/19 110/68   Pulse Readings from Last 3 Encounters:  10/04/19 71  10/03/19 74  09/21/19 60   Pain Assessment Pain Score: 0-No pain/10  Unable to assess due to telephone consult visit format.   KPS = 70  100 - Normal; no complaints; no evidence of disease. 90   - Able to carry on normal activity; minor signs or symptoms of disease. 80   - Normal activity with effort; some signs or symptoms of disease. 78   - Cares for self; unable to carry on normal activity or to do active work. 60   - Requires occasional assistance, but is able to care for most of his personal needs. 50   - Requires considerable assistance and frequent medical care. 23   - Disabled; requires special care and assistance. 67   - Severely disabled; hospital admission is indicated although death not imminent. 64   - Very sick; hospital admission necessary; active supportive treatment necessary. 10   - Moribund; fatal processes progressing rapidly. 0     - Dead  Karnofsky DA, Abelmann Arispe, Craver LS and Burchenal [facility] [phone]) The use of the nitrogen mustards in the palliative treatment of carcinoma: with particular reference to bronchogenic carcinoma Cancer 1 634-56  LABORATORY DATA:  Lab Results  Component Value Date   WBC 7.3 10/03/2019   HGB 11.1 (L) 10/03/2019   HCT 35.8 (L) 10/03/2019   MCV 90.2 10/03/2019   PLT 347 10/03/2019   Lab  Results  Component Value Date   NA 133 (L) 10/03/2019   K 3.2 (L) 10/03/2019   CL 108 10/03/2019   CO2 21 (L) 10/03/2019   Lab Results  Component Value Date   ALT 9 09/21/2019   AST 11 (L) 09/21/2019   ALKPHOS 47 09/21/2019   BILITOT 1.0 09/21/2019     RADIOGRAPHY: No results found.    IMPRESSION/PLAN: This visit was conducted via Telephone to spare the Elijah unnecessary potential exposure in the healthcare setting during the current COVID-19 pandemic. 1. 85 y.o. gentleman with  progressive metastatic castration-resistant prostate cancer with rectal pain associated with locally recurrent disease likely invading into the rectal wall and metastases to lymph nodes and bones. Today, we talked to the Elijah and family about the findings and workup thus far. We discussed the natural history of metastatic prostate cancer and general treatment, highlighting the role of radiotherapy in the management. We discussed the available radiation techniques, and focused on the details and logistics of delivery. We discussed the importance of obtaining tissue confirmation prior to proceeding with radiotherapy and pending there are no unexpected findings, our recommendation would be to proceed with a 2 week course of palliative radiotherapy to the prostate/rectal mass to shrink the tumor and improve his pain/discomfort.  We reviewed the anticipated acute and late sequelae associated with radiation in this setting as well as the risks associated with re-treatment of an area that has previously received full dose radiation, albeit 20+ years ago. The Elijah and his daughter, Elijah Santana, were encouraged to ask questions that were answered to their stated satisfaction.  At the end of the conversation the Elijah appears to have a good understanding of his disease and our treatment recommendations.  He is interested in moving forward with the repeat prostate biopsy as scheduled on 10/20/2019 with Dr. Jeffie Pollock and pending there are no unexpected findings, he would like to proceed with.palliative radiation therapy as above.  We will tentatively schedule CT SIM/treatment planning on 10/24/19, in anticipation of beginning his treatments the week of 10/30/19. We will share our discussion and recommendations with Dr. Jeffie Pollock and [name] and move forward with treatment planning accordingly. The Elijah and his daughter are comfortable and in agreement with the stated plan.   Given current concerns for Elijah exposure  during the COVID-19 pandemic, this encounter was conducted via telephone. The Elijah was notified in advance and was offered a MyChart meeting to allow for face to face communication but unfortunately reported that he did not have the appropriate resources/technology to support such a visit and instead preferred to proceed with telephone consult. The Elijah has given verbal consent for this type of encounter. The time spent during this encounter was 60 minutes. The attendants for this meeting include Tyler Pita MD, Ashlyn Bruning PA-C, [place], Elijah Santana and his daughter Elijah Santana. During the encounter, Tyler Pita MD, Ashlyn Bruning PA-C, and scribe, Wilburn Mylar were located at [facility].  Elijah Santana and daughter Elijah Santana were located at home.    Nicholos Johns, PA-C    Tyler Pita, MD  [facility] Direct Dial: [phone]   Fax: [phone] .com   Skype   [email or web address]  This document serves as a record of services personally performed by Tyler Pita, MD and Freeman Caldron, PA-C. It was created on their behalf by Wilburn Mylar, a trained medical scribe. The creation of this record is based on  the scribe's personal observations and the provider's statements to them. This document has been checked and approved by the attending provider.

## 2019-10-09 ENCOUNTER — Inpatient Hospital Stay: Payer: Medicare HMO

## 2019-10-09 ENCOUNTER — Other Ambulatory Visit: Payer: Self-pay | Admitting: Licensed Clinical Social Worker

## 2019-10-09 ENCOUNTER — Inpatient Hospital Stay: Payer: Medicare HMO | Attending: Genetic Counselor | Admitting: Licensed Clinical Social Worker

## 2019-10-09 ENCOUNTER — Other Ambulatory Visit: Payer: Self-pay

## 2019-10-09 ENCOUNTER — Encounter: Payer: Self-pay | Admitting: Licensed Clinical Social Worker

## 2019-10-09 DIAGNOSIS — Z86010 Personal history of colonic polyps: Secondary | ICD-10-CM | POA: Diagnosis not present

## 2019-10-09 DIAGNOSIS — C61 Malignant neoplasm of prostate: Secondary | ICD-10-CM | POA: Diagnosis not present

## 2019-10-09 DIAGNOSIS — Z800 Family history of malignant neoplasm of digestive organs: Secondary | ICD-10-CM

## 2019-10-09 DIAGNOSIS — Z808 Family history of malignant neoplasm of other organs or systems: Secondary | ICD-10-CM | POA: Diagnosis not present

## 2019-10-09 NOTE — Progress Notes (Signed)
REFERRING PROVIDER: Derek Jack, MD [address] [place],  [place] [address]  PRIMARY PROVIDER:  Glenda Chroman, MD  PRIMARY REASON FOR VISIT:  1. Prostate cancer ([place])   2. Family history of pancreatic cancer   3. Family history of brain cancer      HISTORY OF PRESENT ILLNESS:   [name], a 85 y.o. male, was seen for a [place] cancer genetics consultation at the request of [name] due to a personal and family history of cancer.  Elijah Santana presents to clinic today to discuss the possibility of a hereditary predisposition to cancer, genetic testing, and to further clarify his future cancer risks, as well as potential cancer risks for family members.   In 1996, Elijah Santana was diagnosed with prostate cancer. This was treated with radiation. He had a biochemical recurrence and increased PSA.  In 2020 he underwent CT that showed increase in size of prostate. He will have prostate biopsy 10/20/2019. He reports having one colonoscopy in the past that revealed several polyps.   CANCER HISTORY:  Oncology History  Prostate cancer ([place])  11/27/2017 Initial Diagnosis   Prostate cancer [facility])      Past Medical History:  Diagnosis Date  . Arthritis   . Asthma    Childhood  . Chronic back pain   . Coronary atherosclerosis of native coronary artery    a. CABG 2004 with LIMA-LAD, SVG-D1, SVG-OM, SVG-PDA. b. Cath ~2010 with occ of SVG-diagonal, distal LAD and OM filiing by collaterals. c. NSTEMI 12/2014 s/p DES to SVG-RCA. d. 11/2015: DES to distal graft of the SVG-distal RCA  . Essential hypertension   . Family history of brain cancer   . Family history of pancreatic cancer   . Foley catheter in place   . GERD (gastroesophageal reflux disease)   . Hard of hearing   . Hyperglycemia   . Ischemic cardiomyopathy    a. EF 45% by cath 12/2014.  . Mixed hyperlipidemia   . NSTEMI (non-ST elevated myocardial infarction) ([place]) 01/01/15  . Paroxysmal atrial fibrillation ([facility])   . Pneumonia     hx of   . Prostate cancer [facility])    Radiation therapy 1996  . Skin cancer   . Stroke [facility])    TIA- 28 years ago     Past Surgical History:  Procedure Laterality Date  . CARDIAC CATHETERIZATION  01/01/2015   Procedure: CORONARY STENT INTERVENTION;  Surgeon: Peter M Martinique, MD;  Location: [facility] CATH LAB;  Service: Cardiovascular;;  SVG to PDA  . CARDIAC CATHETERIZATION N/A 11/21/2015   Procedure: Left Heart Cath and Cors/Grafts Angiography;  Surgeon: Troy Sine, MD;  Location: [place] CV LAB;  Service: Cardiovascular;  Laterality: N/A;  . CARDIAC CATHETERIZATION N/A 11/21/2015   Procedure: Coronary Stent Intervention;  Surgeon: Troy Sine, MD;  Location: [place] CV LAB;  Service: Cardiovascular;  Laterality: N/A;  . CARDIAC CATHETERIZATION N/A 09/11/2016   Procedure: Left Heart Cath and Cors/Grafts Angiography;  Surgeon: Leonie Man, MD;  Location: [place] CV LAB;  Service: Cardiovascular;  Laterality: N/A;  . CARDIAC CATHETERIZATION N/A 09/11/2016   Procedure: Coronary Stent Intervention;  Surgeon: Leonie Man, MD;  Location: [place] CV LAB;  Service: Cardiovascular;  Laterality: N/A;  . CORONARY ARTERY BYPASS GRAFT  2004   LIMA to LAD, SVG to diagonal, SVG to circumflex, SVG to PDA  . CYSTOSCOPY WITH URETHRAL DILATATION N/A 03/28/2019   Procedure: CYSTOSCOPY WITH URETHRAL  DILATATION OF STRICTURE;  Surgeon: Irine Seal, MD;  Location: [facility] ORS;  Service: Urology;  Laterality: N/A;  . GREEN LIGHT LASER TURP (TRANSURETHRAL RESECTION OF PROSTATE N/A 06/04/2016   Procedure: GREEN LIGHT LASER TURP (TRANSURETHRAL RESECTION OF PROSTATE;  Surgeon: Irine Seal, MD;  Location: [facility] ORS;  Service: Urology;  Laterality: N/A;  . LEFT HEART CATHETERIZATION WITH CORONARY/GRAFT ANGIOGRAM N/A 01/01/2015   Procedure: LEFT HEART CATHETERIZATION WITH Beatrix Fetters;  Surgeon: Peter M Martinique, MD;  Location: [facility] CATH LAB;  Service: Cardiovascular;  Laterality: N/A;  . PERCUTANEOUS  CORONARY STENT INTERVENTION (PCI-S)  11/20/2015   distal SVG  with DES       . TONSILLECTOMY      Social History   Socioeconomic History  . Marital status: Widowed    Spouse name: Not on file  . Number of children: 2  . Years of education: Not on file  . Highest education level: Not on file  Occupational History  . Occupation: Retired    Comment: Office manager  Social Needs  . Financial resource strain: Not on file  . Food insecurity    Worry: Not on file    Inability: Not on file  . Transportation needs    Medical: Not on file    Non-medical: Not on file  Tobacco Use  . Smoking status: Former Smoker    Packs/day: 0.50    Years: 27.00    Pack years: 13.50    Types: Cigars    Start date: 07/28/1964    Quit date: 07/28/1986    Years since quitting: 33.2  . Smokeless tobacco: Former Systems developer    Types: Chew    Quit date: 08/07/2010  . Tobacco comment: never chewed up over a pack/day  Substance and Sexual Activity  . Alcohol use: No    Alcohol/week: 0.0 standard drinks  . Drug use: No  . Sexual activity: Not Currently  Lifestyle  . Physical activity    Days per week: Not on file    Minutes per session: Not on file  . Stress: Not on file  Relationships  . Social Herbalist on phone: Not on file    Gets together: Not on file    Attends religious service: Not on file    Active member of club or organization: Not on file    Attends meetings of clubs or organizations: Not on file    Relationship status: Not on file  Other Topics Concern  . Not on file  Social History Narrative  . Not on file     FAMILY HISTORY:  We obtained a detailed, 4-generation family history.  Significant diagnoses are listed below: Family History  Problem Relation Age of Onset  . Hypertension Father   . Coronary artery disease Father   . Pancreatic cancer Mother 40  . Brain cancer Sister 37  . Brain cancer Nephew        dx age 9, d. 46s   Elijah Santana had 2 sons and 2  daughters. Both of his sons are deceased, no cancer diagnoses. His daughter [name] was present for our session. He has 6 grandchildren. Mr. Bienvenue had 4 sisters and 2 brothers. One of his sisters had brain cancer and died at 82. One of his nephews (son of one of his brothers) also had brain cancer at age 12 and died in his 76s.   Mr. Huish mother was diagnosed with pancreatic cancer at 67 and died at 32. Patient had 4 maternal aunts, 1  maternal uncle, no known cancers. No known cancers in maternal cousins. Maternal grandmother passed in her 36s, maternal grandfather passed of "old age."  Mr. Arnold father died due to heart issues at 57. Patient had 3 paternal aunts, 8 paternal uncles, no known cancers. No known cancers in paternal cousins. Paternal grandparents both passed in their 44s, no cancers.  Mr. Mish is unaware of previous family history of genetic testing for hereditary cancer risks. Patient's maternal ancestors are of European descent, and paternal ancestors are of European descent. There is no reported Ashkenazi Jewish ancestry. There is no known consanguinity.  GENETIC COUNSELING ASSESSMENT: Mr. Ratledge is a 85 y.o. male with a personal and family history which is somewhat suggestive of a hereditary cancer syndrome and predisposition to cancer. We, therefore, discussed and recommended the following at today's visit.   DISCUSSION: We discussed that 5 - 10% of prostate cancer is hereditary. We discussed the BRCA1/BRCA2 genes given his personal history and his mother's history of pancreatic cancer.  There are other genes that can be associated with hereditary prostate cancer syndromes, and genes associated with the other cancers in his family such as pancreatic and brain. We discussed that testing is beneficial for several reasons including understanding if any targeted systemic therapy would be appropriate,  knowing how to follow individuals after completing their treatment, and understand if other  family members could be at risk for cancer and allow them to undergo genetic testing.   We reviewed the characteristics, features and inheritance patterns of hereditary cancer syndromes. We also discussed genetic testing, including the appropriate family members to test, the process of testing, insurance coverage and turn-around-time for results. We discussed the implications of a negative, positive and/or variant of uncertain significant result. We recommended Mr. Gilbert pursue genetic testing for the [employer].   The CustomNext-Cancer + RNAinsight panel  includes sequencing and/or deletion duplication testing of the following 51 genes:  AIP , ALK , APC ? , ATM ? , AXIN2 , BARD1 , BMPR1A , BRCA1 ? , BRCA2 ? , BRIP1 ? , CDH1 ? , CDK4 , CDKN1B , CDKN2A , CHEK2 ? , DICER1 , EPCAM , GREM1 , HOXB13 , LZTR1 , MEN1 , MLH1 ? , MSH2 ? , MSH3 , MSH6 ? , MUTYH ? , NBN , NF1 ? , NF2 , NTHL1 , PALB2 ? , PHOX2B , PMS2 ? , POLD1 , POLE , POT1 , PRKAR1A , PTCH1 , PTEN ? , RAD51C ? , RAD51D ? , RECQL , SMAD4 , SMARCA4 , SMARCB1 , SMARCE1 , STK11 , SUFU , TP53 ? , TSC1 , TSC2 , VHL.   Based on Mr. Hamberger personal and family history of cancer, he meets medical criteria for genetic testing. Despite that he meets criteria, he may still have an out of pocket cost. We discussed that if his out of pocket cost for testing is over $100, the laboratory will call and confirm whether he wants to proceed with testing.  If the out of pocket cost of testing is less than $100 he will be billed by the genetic testing laboratory.   PLAN: After considering the risks, benefits, and limitations, Mr. Barot provided informed consent to pursue genetic testing and the blood sample was sent to [facility] for analysis of the CustomNext Panel. Results should be available within approximately 2-3 weeks' time, at which point they will be disclosed by telephone to Mr. Wimes, as will any additional recommendations warranted by these  results. Mr. Joye will receive a summary of his genetic counseling visit and a copy of his results once available. This information will also be available in Epic.   Mr. Din questions were answered to his satisfaction today. Our contact information was provided should additional questions or concerns arise. Thank you for the referral and allowing [place] to share in the care of your patient.   Faith Rogue, MS, [facility] Genetic Counselor [email or web address] Phone: [phone]  The patient was seen for a total of 40 minutes in face-to-face genetic counseling.  His daughter [name] was present for this visit as well. Drs. Magrinat, Lindi Adie and/or Burr Medico were available for discussion regarding this case.   _______________________________________________________________________ For Office Staff:  Number of people involved in session: 2 Was an Intern/ student involved with case: no

## 2019-10-10 LAB — GENETIC SCREENING ORDER

## 2019-10-13 MED FILL — XTANDI 40 MG CAPSULE: 40 | 30 days supply | Qty: 120 | Fill #2

## 2019-10-19 ENCOUNTER — Ambulatory Visit (HOSPITAL_COMMUNITY): Payer: Medicare HMO | Admitting: Hematology

## 2019-10-19 ENCOUNTER — Ambulatory Visit (HOSPITAL_COMMUNITY): Payer: Medicare HMO

## 2019-10-19 ENCOUNTER — Other Ambulatory Visit (HOSPITAL_COMMUNITY): Payer: Medicare HMO

## 2019-10-20 ENCOUNTER — Other Ambulatory Visit: Payer: Self-pay

## 2019-10-20 ENCOUNTER — Other Ambulatory Visit (HOSPITAL_COMMUNITY): Payer: Self-pay | Admitting: Urology

## 2019-10-20 ENCOUNTER — Ambulatory Visit (HOSPITAL_COMMUNITY)
Admission: RE | Admit: 2019-10-20 | Discharge: 2019-10-20 | Disposition: A | Discharge status: Home / Self Care | Payer: Medicare HMO | Source: Ambulatory Visit | Attending: Urology | Admitting: Urology

## 2019-10-20 ENCOUNTER — Encounter (HOSPITAL_COMMUNITY): Payer: Self-pay

## 2019-10-20 ENCOUNTER — Other Ambulatory Visit: Payer: Self-pay | Admitting: Urology

## 2019-10-20 DIAGNOSIS — C7951 Secondary malignant neoplasm of bone: Secondary | ICD-10-CM | POA: Diagnosis not present

## 2019-10-20 DIAGNOSIS — C61 Malignant neoplasm of prostate: Secondary | ICD-10-CM | POA: Insufficient documentation

## 2019-10-20 MED ORDER — LIDOCAINE HCL (PF) 2 % IJ SOLN
INTRAMUSCULAR | Status: AC
  Administered 2019-10-20: 13:00:00
  Filled 2019-10-20: qty 10

## 2019-10-20 MED ORDER — LIDOCAINE HCL (PF) 1 % IJ SOLN
INTRAMUSCULAR | Status: AC
  Administered 2019-10-20: 2 mL
  Filled 2019-10-20: qty 5

## 2019-10-20 MED ORDER — CEFTRIAXONE SODIUM 1 G IJ SOLR
1.0000 g | Freq: Once | INTRAMUSCULAR | Status: AC
  Administered 2019-10-20: 13:00:00 1 g via INTRAMUSCULAR

## 2019-10-20 MED ORDER — CEFTRIAXONE SODIUM 1 G IJ SOLR
INTRAMUSCULAR | Status: AC
  Administered 2019-10-20: 1 g via INTRAMUSCULAR
  Filled 2019-10-20: qty 10

## 2019-10-23 ENCOUNTER — Telehealth: Payer: Self-pay | Admitting: Radiation Oncology

## 2019-10-23 DIAGNOSIS — M159 Polyosteoarthritis, unspecified: Secondary | ICD-10-CM | POA: Diagnosis not present

## 2019-10-23 DIAGNOSIS — I10 Essential (primary) hypertension: Secondary | ICD-10-CM | POA: Diagnosis not present

## 2019-10-23 DIAGNOSIS — I2510 Atherosclerotic heart disease of native coronary artery without angina pectoris: Secondary | ICD-10-CM | POA: Diagnosis not present

## 2019-10-23 NOTE — Telephone Encounter (Signed)
Sam, Please also encourage his daughter, [name], to follow up with Dr. Ralene Muskrat office first thing in the morning regarding biopsy results as they will have these available before we will and most appropriate to review results with the performing provider. Robin, Please watch for the biopsy results in [facility] EMR and have scanned into Epic as soon as they are available. Hoping to have them prior to patient's scheduled CT SIM in the morning. Thank you! -Niles Ess

## 2019-10-23 NOTE — Telephone Encounter (Signed)
Received voicemail message from patient's daughter, [name], wishing to understand her father's appointment tomorrow. Returned her call promptly. Explained her father is scheduled for a CT simulation at 1030 on 10/24/2019. She questioned the biopsy results. Explained they haven't been received yet but we are hopeful they will be here by tomorrow. Reassured her that the results of the biopsy will be reviewed before the simulation scan takes place. She explains she is coming out of [place] to bring her father. This RN provided the address of [place] for navigation purchases. [name] verbalized understanding and confirmed tomorrow's appointment.

## 2019-10-24 ENCOUNTER — Telehealth: Payer: Self-pay | Admitting: Medical Oncology

## 2019-10-24 ENCOUNTER — Other Ambulatory Visit: Payer: Self-pay

## 2019-10-24 ENCOUNTER — Ambulatory Visit
Admission: RE | Admit: 2019-10-24 | Discharge: 2019-10-24 | Disposition: A | Discharge status: Home / Self Care | Payer: Medicare HMO | Source: Ambulatory Visit | Attending: Radiation Oncology | Admitting: Radiation Oncology

## 2019-10-24 ENCOUNTER — Encounter: Payer: Self-pay | Admitting: Medical Oncology

## 2019-10-24 DIAGNOSIS — K6289 Other specified diseases of anus and rectum: Secondary | ICD-10-CM | POA: Insufficient documentation

## 2019-10-24 DIAGNOSIS — C61 Malignant neoplasm of prostate: Secondary | ICD-10-CM | POA: Insufficient documentation

## 2019-10-24 DIAGNOSIS — Z510 Encounter for antineoplastic radiation therapy: Secondary | ICD-10-CM | POA: Insufficient documentation

## 2019-10-24 NOTE — Progress Notes (Signed)
Introduced myself to the patient and his daughter Elijah Santana, during  Elijah Santana and discussed my role. I was unable to meet him the day he consulted with [name], due to visit being held by telephone. Patient had a prostate biopsy and hoping to get results. I called [facility] and Dr. Orene Desanctis was able to give [place] a preliminary report. 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 She discussed how this is a new cancer, most likely from radiation to the prostate in 1996. Elijah Santana voiced her appreciation for getting results and moving forward with treatment. She states this process has been frustrating and Santana he is getting help. She shared how her father started having issues in May has continued to decline. He has lot about 60 pounds, having pain in his legs and is spending lots of time in bed. He is experiencing nausea and has very little appetite. I encouraged her to call Dr. Tomie China office, to request anti-nausea medication. We discussed drinking high protein supplements to give him calories and protein. I gave her my business card and asked her to call me if I can assist her in any way. She asked that we call her because her father does not hear well. She voiced understanding of the above.

## 2019-10-24 NOTE — Telephone Encounter (Signed)
Called [facility] asking if prostate biopsy from 12/4 had been read. It has not but message left with Dr. Orene Desanctis to see if she can read. Patient has an appointment today for CT simulation.

## 2019-10-24 NOTE — Telephone Encounter (Signed)
Received call from Dr. Tenny Craw [facility] stating the prostate biopsy from 12/4, show squamous cell not prostate. She asked if he had history of any GI cancer. He has not, but had radiation to the prostate 1996. Dr. Orene Desanctis would like to discuss with [name]. Message forwarded to [name] and Ailene Ards, [place] notified of the above.

## 2019-10-24 NOTE — Progress Notes (Signed)
  Radiation Oncology         [phone] ________________________________  Name: Elijah Santana MRN: [id number]  Date: 10/24/2019  DOB: December 11, 1931  SIMULATION AND TREATMENT PLANNING NOTE    ICD-10-CM   1. Prostate cancer [facility])  C61     DIAGNOSIS:  85 y.o. 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  NARRATIVE:  The patient was brought to the [facility].  Identity was confirmed.  All relevant records and images related to the planned course of therapy were reviewed.  The patient freely provided informed written consent to proceed with treatment after reviewing the details related to the planned course of therapy. The consent form was witnessed and verified by the simulation staff.  Then, the patient was set-up in a stable reproducible  supine position for radiation therapy.  CT images were obtained.  Surface markings were placed.  The CT images were loaded into the planning software.  Then the target and avoidance structures were contoured.  Treatment planning then occurred.  The radiation prescription was entered and confirmed.  Then, I designed and supervised the construction of a total of 5 medically necessary complex treatment devices including vac-loc positioner and 4 MLCs to shield rectum and bladder maximally.  I have requested : 3D Simulation  I have requested a DVH of the following structures: rectum, bladder, left femoral neck, right femoral neck and target.  SPECIAL TREATMENT PROCEDURE:  The planned course of therapy using radiation constitutes a special treatment procedure. Special care is required in the management of this patient for the following reasons. This treatment constitutes a Special Treatment Procedure for the following reason: [ Retreatment in a previously radiated area requiring careful monitoring of increased risk of toxicity due to overlap of previous  treatment..  The special nature of the planned course of radiotherapy will require increased physician supervision and oversight to ensure patient's safety with optimal treatment outcomes.  PLAN:  The patient will receive 30 Gy in 10 fractions.  ________________________________  Sheral Apley Tammi Klippel, M.D.  This document serves as a record of services personally performed by Eppie Gibson, MD. It was created on her behalf by Wilburn Mylar, a trained medical scribe. The creation of this record is based on the scribe's personal observations and the provider's statements to them. This document has been checked and approved by the attending provider.

## 2019-10-24 NOTE — Telephone Encounter (Signed)
Called Dr. Ralene Muskrat office asking if they have results of prostate biopsy from 10/20/19. They do not have results as of this morning.

## 2019-10-25 ENCOUNTER — Other Ambulatory Visit (HOSPITAL_COMMUNITY): Payer: Self-pay | Admitting: *Deleted

## 2019-10-25 ENCOUNTER — Telehealth: Payer: Self-pay | Admitting: Licensed Clinical Social Worker

## 2019-10-25 ENCOUNTER — Ambulatory Visit (INDEPENDENT_AMBULATORY_CARE_PROVIDER_SITE_OTHER): Payer: Medicare HMO | Admitting: Urology

## 2019-10-25 ENCOUNTER — Encounter (HOSPITAL_COMMUNITY): Payer: Self-pay | Admitting: *Deleted

## 2019-10-25 ENCOUNTER — Ambulatory Visit: Payer: Self-pay | Admitting: Licensed Clinical Social Worker

## 2019-10-25 ENCOUNTER — Encounter: Payer: Self-pay | Admitting: Licensed Clinical Social Worker

## 2019-10-25 DIAGNOSIS — Z1379 Encounter for other screening for genetic and chromosomal anomalies: Secondary | ICD-10-CM

## 2019-10-25 DIAGNOSIS — C61 Malignant neoplasm of prostate: Secondary | ICD-10-CM | POA: Diagnosis not present

## 2019-10-25 DIAGNOSIS — Z800 Family history of malignant neoplasm of digestive organs: Secondary | ICD-10-CM

## 2019-10-25 DIAGNOSIS — K6289 Other specified diseases of anus and rectum: Secondary | ICD-10-CM | POA: Diagnosis not present

## 2019-10-25 DIAGNOSIS — R338 Other retention of urine: Secondary | ICD-10-CM

## 2019-10-25 DIAGNOSIS — Z808 Family history of malignant neoplasm of other organs or systems: Secondary | ICD-10-CM

## 2019-10-25 DIAGNOSIS — Z510 Encounter for antineoplastic radiation therapy: Secondary | ICD-10-CM | POA: Diagnosis not present

## 2019-10-25 DIAGNOSIS — Z7189 Other specified counseling: Secondary | ICD-10-CM | POA: Insufficient documentation

## 2019-10-25 MED ORDER — PROCHLORPERAZINE MALEATE 10 MG PO TABS: 10.0000 mg | ORAL_TABLET | Freq: Four times a day (QID) | ORAL | 1 refills | Status: DC | PRN

## 2019-10-25 NOTE — Progress Notes (Signed)
Patients daughter, [name], called and wanted to cancel this weeks appointment and move it out to next week.  She wanted to make sure that she was able to be here with her dad so that she can hear the results of his testing.  She reports that he has had some nausea and would like for something to be called in to his pharmacy.  Okay per [name].  She also had questions regarding his xtandi and if he should still continue taking it.  Per [name], patient is to continue taking the [facility] as ordered.  She verbalizes understanding and will tell her dad.  She is also aware of his new appointment for next week.

## 2019-10-25 NOTE — Progress Notes (Signed)
HPI:  [name] was previously seen in the [facility] due to a personal and family history of cancer and concerns regarding a hereditary predisposition to cancer. Please refer to our prior cancer genetics clinic note for more information regarding our discussion, assessment and recommendations, at the time. [name] recent genetic test results were disclosed to him, as were recommendations warranted by these results. These results and recommendations are discussed in more detail below.  CANCER HISTORY:  Oncology History  Prostate cancer (Elijah Santana)  11/27/2017 Initial Diagnosis   Prostate cancer (Elijah Santana)    Genetic Testing   Negative genetic testing: No pathogenic variants identified. VUS in ALK called c.3330G>C and VUS in [facility] called c.4369A>T identified on the [facility] CustomNext+RNAinsight panel. The report date is 10/23/2019.  The CustomNext-Cancer + RNAinsight panel  includes sequencing and/or deletion duplication testing of the following 52 genes: AIP, ALK, APC*, ATM*, AXIN2, BARD1, BMPR1A, BRCA1*, BRCA2*, BRIP1*, CDH1*, CDK4, CDKN1B, CDKN2A, CHEK2*, DICER1, LZTR1, MEN1, MLH1*, MSH2*, MSH3, MSH6*, MUTYH*, NBN, NF1*, NF2, NTHL1, PALB2*, PHOX2B, PMS2*, POT1, PRKAR1A, PTCH1, PTEN*, RAD51C*, RAD51D*, RECQL, SMAD4, SMARCA4, SMARCB1, SMARCE1, STK11, SUFU, TP53*, TSC1, TSC2 and VHL (sequencing and deletion/duplication); HOXB13, POLD1 and POLE (sequencing only); EPCAM and GREM1 (deletion/duplication only). DNA and RNA analyses performed for * genes.     FAMILY HISTORY:  We obtained a detailed, 4-generation family history.  Significant diagnoses are listed below: Family History  Problem Relation Age of Onset  . Hypertension Father   . Coronary artery disease Father   . Pancreatic cancer Mother 37  . Brain cancer Sister 44  . Brain cancer Nephew        dx age 84, d. 21s    [name] had 2 sons and 2 daughters. Both of his sons are deceased, no cancer diagnoses. His daughter Elijah Santana  was present for our session. He has 6 grandchildren. Mr. Schildt had 4 sisters and 2 brothers. One of his sisters had brain cancer and died at 29. One of his nephews (son of one of his brothers) also had brain cancer at age 53 and died in his 76s.   Mr. Certain mother was diagnosed with pancreatic cancer at 4 and died at 35. Patient had 4 maternal aunts, 1 maternal uncle, no known cancers. No known cancers in maternal cousins. Maternal grandmother passed in her 34s, maternal grandfather passed of "old age."  Mr. Gazda father died due to heart issues at 4. Patient had 3 paternal aunts, 8 paternal uncles, no known cancers. No known cancers in paternal cousins. Paternal grandparents both passed in their 54s, no cancers.  Mr. Seda is unaware of previous family history of genetic testing for hereditary cancer risks. Patient's maternal ancestors are of European descent, and paternal ancestors are of European descent. There is no reported Ashkenazi Jewish ancestry. There is no known consanguinity.  GENETIC TEST RESULTS: Genetic testing reported out on 10/23/2019 through the Elijah Santana cancer panel found no pathogenic mutations.  The CustomNext-Cancer + RNAinsight panel  includes sequencing and/or deletion duplication testing of the following 52 genes: AIP, ALK, APC*, ATM*, AXIN2, BARD1, BMPR1A, BRCA1*, BRCA2*, BRIP1*, CDH1*, CDK4, CDKN1B, CDKN2A, CHEK2*, DICER1, LZTR1, MEN1, MLH1*, MSH2*, MSH3, MSH6*, MUTYH*, NBN, NF1*, NF2, NTHL1, PALB2*, PHOX2B, PMS2*, POT1, PRKAR1A, PTCH1, PTEN*, RAD51C*, RAD51D*, RECQL, SMAD4, SMARCA4, SMARCB1, SMARCE1, STK11, SUFU, TP53*, TSC1, TSC2 and VHL (sequencing and deletion/duplication); HOXB13, POLD1 and POLE (sequencing only); EPCAM and GREM1 (deletion/duplication only). DNA and RNA analyses performed for * genes.  The test  report has been scanned into Elijah Santana and is located under the Molecular Pathology section of the Results Review tab.  A portion of the result report is  included below for reference.     We discussed with [name] that because current genetic testing is not perfect, it is possible there may be a gene mutation in one of these genes that current testing cannot detect, but that chance is small.  We also discussed, that there could be another gene that has not yet been discovered, or that we have not yet tested, that is responsible for the cancer diagnoses in the family. It is also possible there is a hereditary cause for the cancer in the family that [name] did not inherit and therefore was not identified in his testing.  Therefore, it is important to remain in touch with cancer genetics in the future so that we can continue to offer [name] the most up to date genetic testing.   Genetic testing did identify 2 Variants of uncertain significance (VUS) - one in the ALK gene called p.E1110D, a second in the [facility] gene called p.T1457S.  At this time, it is unknown if these variants are associated with increased cancer risk or if they are normal findings, but most variants such as these get reclassified to being inconsequential. They should not be used to make medical management decisions. With time, we suspect the lab will determine the significance of these variants, if any. If we do learn more about them, we will try to contact [name] to discuss it further. However, it is important to stay in touch with [place] periodically and keep the address and phone number up to date.  ADDITIONAL GENETIC TESTING: We discussed with [name] that his genetic testing was fairly extensive.  If there are genes identified to increase cancer risk that can be analyzed in the future, we would be happy to discuss and coordinate this testing at that time.    CANCER SCREENING RECOMMENDATIONS: [name] test result is considered negative (normal).  This means that we have not identified a hereditary cause for his  personal and family history of cancer at this time. Most cancers  happen by chance and this negative test suggests that his cancer may fall into this category.    While reassuring, this does not definitively rule out a hereditary predisposition to cancer. It is still possible that there could be genetic mutations that are undetectable by current technology. There could be genetic mutations in genes that have not been tested or identified to increase cancer risk.  Therefore, it is recommended he continue to follow the cancer management and screening guidelines provided by his oncology and primary healthcare provider.   An individual's cancer risk and medical management are not determined by genetic test results alone. Overall cancer risk assessment incorporates additional factors, including personal medical history, family history, and any available genetic information that may result in a personalized plan for cancer prevention and surveillance.  RECOMMENDATIONS FOR FAMILY MEMBERS:  Relatives in this family might be at some increased risk of developing cancer, over the general population risk, simply due to the family history of cancer.  We recommended male relatives in this family have a yearly mammogram beginning at age 71, or 62 years younger than the earliest onset of cancer, an annual clinical breast exam, and perform monthly breast self-exams. Male relatives in this family should also have a gynecological exam as recommended by their primary provider. All family  members should have a colonoscopy by age 59, or as directed by their physicians.   It is also possible there is a hereditary cause for the cancer in [name] family that he did not inherit and therefore was not identified in him.  Based on [name] family history, we recommended his siblings/maternal relatives have genetic counseling and testing. [name] will let us know if we can be of any assistance in coordinating genetic counseling and/or testing for these family members.  FOLLOW-UP: Lastly, we  discussed with [name] that cancer genetics is a rapidly advancing field and it is possible that new genetic tests will be appropriate for him and/or his family members in the future. We encouraged him to remain in contact with cancer genetics on an annual basis so we can update his personal and family histories and let him know of advances in cancer genetics that may benefit this family.   Our contact number was provided. [name] questions were answered to his satisfaction, and he knows he is welcome to call us at anytime with additional questions or concerns.   Faith Rogue, MS, [facility] Genetic Counselor [place]._0 .com Phone: [phone]

## 2019-10-25 NOTE — Telephone Encounter (Signed)
Revealed negative genetic testing.  Revealed that a VUS in ALK and SMARCA4 was identified. This normal result is reassuring and indicates that it is unlikely Elijah Santana cancer is due to a hereditary cause.  It is unlikely that there is an increased risk of another cancer due to a mutation in one of these genes.  However, genetic testing is not perfect, and cannot definitively rule out a hereditary cause.  It will be important for him to keep in contact with genetics to learn if any additional testing may be needed in the future.

## 2019-10-26 ENCOUNTER — Ambulatory Visit (HOSPITAL_COMMUNITY): Payer: Medicare HMO | Admitting: Hematology

## 2019-10-26 DIAGNOSIS — C61 Malignant neoplasm of prostate: Secondary | ICD-10-CM | POA: Diagnosis not present

## 2019-10-26 DIAGNOSIS — K6289 Other specified diseases of anus and rectum: Secondary | ICD-10-CM | POA: Diagnosis not present

## 2019-10-26 DIAGNOSIS — Z510 Encounter for antineoplastic radiation therapy: Secondary | ICD-10-CM | POA: Diagnosis not present

## 2019-10-30 ENCOUNTER — Other Ambulatory Visit: Payer: Self-pay

## 2019-10-30 ENCOUNTER — Ambulatory Visit
Admission: RE | Admit: 2019-10-30 | Discharge: 2019-10-30 | Disposition: A | Discharge status: Home / Self Care | Payer: Medicare HMO | Source: Ambulatory Visit | Attending: Radiation Oncology | Admitting: Radiation Oncology

## 2019-10-30 DIAGNOSIS — C61 Malignant neoplasm of prostate: Secondary | ICD-10-CM | POA: Diagnosis not present

## 2019-10-30 DIAGNOSIS — Z510 Encounter for antineoplastic radiation therapy: Secondary | ICD-10-CM | POA: Diagnosis not present

## 2019-10-30 DIAGNOSIS — K6289 Other specified diseases of anus and rectum: Secondary | ICD-10-CM | POA: Diagnosis not present

## 2019-10-31 ENCOUNTER — Ambulatory Visit
Admission: RE | Admit: 2019-10-31 | Discharge: 2019-10-31 | Disposition: A | Discharge status: Home / Self Care | Payer: Medicare HMO | Source: Ambulatory Visit | Attending: Radiation Oncology | Admitting: Radiation Oncology

## 2019-10-31 ENCOUNTER — Other Ambulatory Visit: Payer: Self-pay

## 2019-10-31 DIAGNOSIS — Z510 Encounter for antineoplastic radiation therapy: Secondary | ICD-10-CM | POA: Diagnosis not present

## 2019-10-31 DIAGNOSIS — C61 Malignant neoplasm of prostate: Secondary | ICD-10-CM | POA: Diagnosis not present

## 2019-10-31 DIAGNOSIS — K6289 Other specified diseases of anus and rectum: Secondary | ICD-10-CM | POA: Diagnosis not present

## 2019-11-01 ENCOUNTER — Other Ambulatory Visit: Payer: Self-pay

## 2019-11-01 ENCOUNTER — Ambulatory Visit
Admission: RE | Admit: 2019-11-01 | Discharge: 2019-11-01 | Disposition: A | Discharge status: Home / Self Care | Payer: Medicare HMO | Source: Ambulatory Visit | Attending: Radiation Oncology | Admitting: Radiation Oncology

## 2019-11-01 DIAGNOSIS — K6289 Other specified diseases of anus and rectum: Secondary | ICD-10-CM | POA: Diagnosis not present

## 2019-11-01 DIAGNOSIS — C61 Malignant neoplasm of prostate: Secondary | ICD-10-CM | POA: Diagnosis not present

## 2019-11-01 DIAGNOSIS — Z510 Encounter for antineoplastic radiation therapy: Secondary | ICD-10-CM | POA: Diagnosis not present

## 2019-11-02 ENCOUNTER — Ambulatory Visit
Admission: RE | Admit: 2019-11-02 | Discharge: 2019-11-02 | Disposition: A | Discharge status: Home / Self Care | Payer: Medicare HMO | Source: Ambulatory Visit | Attending: Radiation Oncology | Admitting: Radiation Oncology

## 2019-11-02 ENCOUNTER — Inpatient Hospital Stay (HOSPITAL_COMMUNITY): Payer: Medicare HMO | Attending: Hematology | Admitting: Hematology

## 2019-11-02 ENCOUNTER — Other Ambulatory Visit: Payer: Self-pay

## 2019-11-02 DIAGNOSIS — C7951 Secondary malignant neoplasm of bone: Secondary | ICD-10-CM | POA: Diagnosis not present

## 2019-11-02 DIAGNOSIS — K5900 Constipation, unspecified: Secondary | ICD-10-CM | POA: Insufficient documentation

## 2019-11-02 DIAGNOSIS — I10 Essential (primary) hypertension: Secondary | ICD-10-CM | POA: Insufficient documentation

## 2019-11-02 DIAGNOSIS — R102 Pelvic and perineal pain: Secondary | ICD-10-CM | POA: Insufficient documentation

## 2019-11-02 DIAGNOSIS — Z8249 Family history of ischemic heart disease and other diseases of the circulatory system: Secondary | ICD-10-CM | POA: Insufficient documentation

## 2019-11-02 DIAGNOSIS — I480 Paroxysmal atrial fibrillation: Secondary | ICD-10-CM | POA: Diagnosis not present

## 2019-11-02 DIAGNOSIS — Z79899 Other long term (current) drug therapy: Secondary | ICD-10-CM | POA: Insufficient documentation

## 2019-11-02 DIAGNOSIS — Z800 Family history of malignant neoplasm of digestive organs: Secondary | ICD-10-CM | POA: Insufficient documentation

## 2019-11-02 DIAGNOSIS — C61 Malignant neoplasm of prostate: Secondary | ICD-10-CM

## 2019-11-02 DIAGNOSIS — Z510 Encounter for antineoplastic radiation therapy: Secondary | ICD-10-CM | POA: Diagnosis not present

## 2019-11-02 DIAGNOSIS — Z808 Family history of malignant neoplasm of other organs or systems: Secondary | ICD-10-CM | POA: Insufficient documentation

## 2019-11-02 DIAGNOSIS — Z923 Personal history of irradiation: Secondary | ICD-10-CM | POA: Insufficient documentation

## 2019-11-02 DIAGNOSIS — K6289 Other specified diseases of anus and rectum: Secondary | ICD-10-CM | POA: Diagnosis not present

## 2019-11-02 DIAGNOSIS — R110 Nausea: Secondary | ICD-10-CM | POA: Insufficient documentation

## 2019-11-02 DIAGNOSIS — E785 Hyperlipidemia, unspecified: Secondary | ICD-10-CM | POA: Diagnosis not present

## 2019-11-02 DIAGNOSIS — C779 Secondary and unspecified malignant neoplasm of lymph node, unspecified: Secondary | ICD-10-CM | POA: Insufficient documentation

## 2019-11-02 NOTE — Patient Instructions (Signed)
[place] at [facility] Discharge Instructions  You were seen today by [name]. He went over your recent test results. He will see you back in 3 weeks for labs, xgeva and follow up.   Thank you for choosing [place] at [facility] to provide your oncology and hematology care.  To afford each patient quality time with our provider, please arrive at least 15 minutes before your scheduled appointment time.   If you have a lab appointment with the [facility] please come in thru the  Main Entrance and check in at the main information desk  You need to re-schedule your appointment should you arrive 10 or more minutes late.  We strive to give you quality time with our providers, and arriving late affects you and other patients whose appointments are after yours.  Also, if you no show three or more times for appointments you may be dismissed from the clinic at the providers discretion.     Again, thank you for choosing [facility].  Our hope is that these requests will decrease the amount of time that you wait before being seen by our physicians.       _____________________________________________________________  Should you have questions after your visit to [facility], please contact our office at [phone] between the hours of 8:00 a.m. and 4:30 p.m.  Voicemails left after 4:00 p.m. will not be returned until the following business day.  For prescription refill requests, have your pharmacy contact our office and allow 72 hours.    Cancer Center Support Programs:   > Cancer Support Group  2nd Tuesday of the month 1pm-2pm, Journey Room

## 2019-11-02 NOTE — Progress Notes (Signed)
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 VUS in [facility] called c.3330G>C and VUS in [facility] called c.4369A>T identified on the [facility] CustomNext+RNAinsight panel. The report date is 10/23/2019.  The CustomNext-Cancer + RNAinsight panel  includes sequencing and/or deletion duplication testing of the following 52 genes: AIP, ALK, APC*, ATM*, AXIN2, BARD1, BMPR1A, BRCA1*, BRCA2*, BRIP1*, CDH1*, CDK4, CDKN1B, CDKN2A, CHEK2*, DICER1, LZTR1, MEN1, MLH1*, MSH2*, MSH3, MSH6*, MUTYH*, NBN, NF1*, NF2, NTHL1, PALB2*, PHOX2B, PMS2*, POT1, PRKAR1A, PTCH1, PTEN*, RAD51C*, RAD51D*, RECQL, SMAD4, SMARCA4, SMARCB1, SMARCE1, STK11, SUFU, TP53*, TSC1, TSC2 and VHL (sequencing and deletion/duplication); HOXB13, POLD1 and POLE (sequencing only); EPCAM and GREM1 (deletion/duplication only). DNA and RNA analyses performed for * genes.      CANCER STAGING: Cancer Staging No matching staging information was found for the patient.   INTERVAL HISTORY:  Elijah Santana 85 y.o. male seen for follow-up of metastatic prostate cancer to the bones.  He underwent a biopsy on 10/20/2019.  He is taking enzalutamide 160 mg daily.  He started radiation therapy to the prostate on 10/24/2019 under the direction of [name].  Appetite and energy levels are 25%.  He is accompanied by his daughter today.  Perirectal pain is controlled with tramadol.  He has occasional nausea and constipation.  Reported some 7 pound weight loss in the last 1 month.   REVIEW OF SYSTEMS:  Review of Systems    Gastrointestinal: Positive for constipation and nausea.  Musculoskeletal:       Perineal pain.  All other systems reviewed and are negative.    PAST MEDICAL/SURGICAL HISTORY:  Past Medical History:  Diagnosis Date  . Arthritis   . Asthma    Childhood  . Chronic back pain   . Coronary atherosclerosis of native coronary artery    a. CABG 2004 with LIMA-LAD, SVG-D1, SVG-OM, SVG-PDA. b. Cath ~2010 with occ of SVG-diagonal, distal LAD and OM filiing by collaterals. c. NSTEMI 12/2014 s/p DES to SVG-RCA. d. 11/2015: DES to distal graft of the SVG-distal RCA  . Essential hypertension   . Family history of brain cancer   . Family history of pancreatic cancer   . Foley catheter in place   . GERD (gastroesophageal reflux disease)   . Hard of hearing   . Hyperglycemia   . Ischemic cardiomyopathy    a. EF 45% by cath 12/2014.  . Mixed hyperlipidemia   . NSTEMI (non-ST elevated myocardial infarction) ([place]) 01/01/15  . Paroxysmal atrial fibrillation ([facility])   . Pneumonia    hx of   . Prostate cancer [facility])    Radiation therapy 1996  . Skin cancer   . Stroke [facility])    TIA- 28 years ago    Past Surgical History:  Procedure Laterality Date  . CARDIAC CATHETERIZATION  01/01/2015   Procedure: CORONARY STENT INTERVENTION;  Surgeon: Elijah M Martinique, MD;  Location: [facility] CATH LAB;  Service: Cardiovascular;;  SVG to PDA  . CARDIAC CATHETERIZATION N/A 11/21/2015   Procedure: Left Heart Cath and Cors/Grafts Angiography;  Surgeon: Elijah Sine, MD;  Location: [place] CV LAB;  Service: Cardiovascular;  Laterality: N/A;  . CARDIAC CATHETERIZATION N/A 11/21/2015   Procedure: Coronary Stent Intervention;  Surgeon: Elijah Sine, MD;  Location: [place] CV LAB;  Service: Cardiovascular;  Laterality: N/A;  . CARDIAC CATHETERIZATION N/A 09/11/2016   Procedure: Left Heart Cath and Cors/Grafts Angiography;  Surgeon: Elijah Man, MD;  Location: [place] CV LAB;  Service: Cardiovascular;  Laterality: N/A;   . CARDIAC CATHETERIZATION N/A 09/11/2016   Procedure: Coronary Stent Intervention;  Surgeon: Elijah Man, MD;  Location: [place] CV LAB;  Service: Cardiovascular;  Laterality: N/A;  . CORONARY ARTERY BYPASS GRAFT  2004   LIMA to LAD, SVG to diagonal, SVG to circumflex, SVG to PDA  . CYSTOSCOPY WITH URETHRAL DILATATION N/A 03/28/2019   Procedure: CYSTOSCOPY WITH URETHRAL  DILATATION OF STRICTURE;  Surgeon: Elijah Seal, MD;  Location: [facility] ORS;  Service: Urology;  Laterality: N/A;  . GREEN LIGHT LASER TURP (TRANSURETHRAL RESECTION OF PROSTATE N/A 06/04/2016   Procedure: GREEN LIGHT LASER TURP (TRANSURETHRAL RESECTION OF PROSTATE;  Surgeon: Elijah Seal, MD;  Location: [facility] ORS;  Service: Urology;  Laterality: N/A;  . LEFT HEART CATHETERIZATION WITH CORONARY/GRAFT ANGIOGRAM N/A 01/01/2015   Procedure: LEFT HEART CATHETERIZATION WITH Beatrix Fetters;  Surgeon: Elijah M Martinique, MD;  Location: [facility] CATH LAB;  Service: Cardiovascular;  Laterality: N/A;  . PERCUTANEOUS CORONARY STENT INTERVENTION (PCI-S)  11/20/2015   distal SVG  with DES       . TONSILLECTOMY       SOCIAL HISTORY:  Social History   Socioeconomic History  . Marital status: Widowed    Spouse name: Not on file  . Number of children: 2  . Years of education: Not on file  . Highest education level: Not on file  Occupational History  . Occupation: Retired    Comment: Office manager  Tobacco Use  . Smoking status: Former Smoker    Packs/day: 0.50    Years: 27.00    Pack years: 13.50    Types: Cigars    Start date: 07/28/1964    Quit date: 07/28/1986    Years since quitting: 33.3  . Smokeless tobacco: Former Systems developer    Types: Chew    Quit date: 08/07/2010  . Tobacco comment: never chewed up over a pack/day  Substance and Sexual Activity  . Alcohol use: No    Alcohol/week: 0.0 standard drinks  . Drug use: No  . Sexual activity: Not Currently  Other Topics Concern  . Not on file  Social History Narrative  .  Not on file   Social Determinants of Health   Financial Resource Strain:   . Difficulty of Paying Living Expenses: Not on file  Food Insecurity:   . Worried About Charity fundraiser in the Last Year: Not on file  . Ran Out of Food in the Last Year: Not on file  Transportation Needs:   . Lack of Transportation (Medical): Not on file  . Lack of Transportation (Non-Medical): Not on file  Physical Activity:   . Days of Exercise per Week: Not on file  . Minutes of Exercise per Session: Not on file  Stress:   . Feeling of Stress : Not on file  Social Connections:   . Frequency of Communication with Friends and Family: Not on file  . Frequency of Social Gatherings with Friends and Family: Not on file  . Attends Religious Services: Not on file  . Active Member of Clubs  or Organizations: Not on file  . Attends Archivist Meetings: Not on file  . Marital Status: Not on file  Intimate Partner Violence:   . Fear of Current or Ex-Partner: Not on file  . Emotionally Abused: Not on file  . Physically Abused: Not on file  . Sexually Abused: Not on file    FAMILY HISTORY:  Family History  Problem Relation Age of Onset  . Hypertension Father   . Coronary artery disease Father   . Pancreatic cancer Mother 52  . Brain cancer Sister 72  . Brain cancer Nephew        dx age 23, d. 48s    CURRENT MEDICATIONS:  Outpatient Encounter Medications as of 11/02/2019  Medication Sig Note  . amLODipine (NORVASC) 5 MG tablet TAKE ONE TABLET BY MOUTH DAILY (MORNING) (Patient taking differently: Take 5 mg by mouth every morning. )   . calcium carbonate (OS-CAL - DOSED IN MG OF ELEMENTAL CALCIUM) 1250 (500 Ca) MG tablet Take 1 tablet by mouth daily.    . clopidogrel (PLAVIX) 75 MG tablet TAKE ONE TABLET BY MOUTH EVERY DAY (,EVENING)   . denosumab (XGEVA) 120 MG/1.7ML SOLN injection Inject 120 mg into the skin once.    Marland Kitchen ELIQUIS 5 MG TABS tablet Take 1 tablet (5 mg total) by mouth 2 (two) times  daily.   . isosorbide mononitrate (IMDUR) 60 MG 24 hr tablet TAKE ONE TABLET BY MOUTH DAILY (MORNING) (Patient taking differently: Take 60 mg by mouth every morning. )   . Leuprolide Acetate (LUPRON IJ) Inject as directed every 4 (four) months.  03/26/2019: Administered by the office of Dr. Jeffie Pollock every 4 months   . metoprolol tartrate (LOPRESSOR) 50 MG tablet Take 1 tablet (50 mg total) by mouth 2 (two) times daily.   . pantoprazole (PROTONIX) 40 MG tablet Take 40 mg by mouth every morning.   . rosuvastatin (CRESTOR) 20 MG tablet Take 1 tablet (20 mg total) by mouth every evening.   . TOVIAZ 4 MG TB24 tablet Take 4 mg by mouth daily.   . traMADol (ULTRAM) 50 MG tablet Take 0.5 tablets (25 mg total) by mouth daily as needed.   Gillermina Phy 40 MG capsule TAKE 4 CAPSULES (160 MG TOTAL) BY MOUTH DAILY. (Patient taking differently: Take 160 mg by mouth daily. )   . [DISCONTINUED] DULoxetine (CYMBALTA) 30 MG capsule Take 30 mg by mouth daily.   . [DISCONTINUED] linaclotide (LINZESS) 290 MCG CAPS capsule Take 1 capsule (290 mcg total) by mouth daily before breakfast.   . acetaminophen (TYLENOL) 325 MG tablet Take 2 tablets (650 mg total) by mouth every 4 (four) hours as needed for headache or mild pain. (Patient not taking: Reported on 11/02/2019)   . nitroGLYCERIN (NITROSTAT) 0.4 MG SL tablet DISSOLVE ONE TABLET UNDER TONGUE EVERY 5 MINUTES UP TO 3 DOSES AS NEEDED FOR CHEST PAIN (Patient not taking: Reported on 10/04/2019)   . prochlorperazine (COMPAZINE) 10 MG tablet Take 1 tablet (10 mg total) by mouth every 6 (six) hours as needed for nausea or vomiting. (Patient not taking: Reported on 11/02/2019)   . STOOL SOFTENER 100 MG capsule Take 100-200 mg by mouth daily as needed for mild constipation.    . [DISCONTINUED] sulfamethoxazole-trimethoprim (BACTRIM DS) 800-160 MG tablet Take 1 tablet by mouth 2 (two) times daily.    No facility-administered encounter medications on file as of 11/02/2019.     ALLERGIES:  No Known Allergies   PHYSICAL EXAM:  ECOG  Performance status: 1  Vitals:   11/02/19 0916  BP: 106/73  Pulse: 79  Resp: 20  Temp: 97.9 F (36.6 C)  SpO2: 99%   Filed Weights   11/02/19 0916  Weight: 169 lb 11.2 oz (77 kg)    Physical Exam Vitals reviewed.  Constitutional:      Appearance: Normal appearance.  Cardiovascular:     Rate and Rhythm: Normal rate and regular rhythm.     Heart sounds: Normal heart sounds.  Pulmonary:     Effort: Pulmonary effort is normal.     Breath sounds: Normal breath sounds.  Abdominal:     General: There is no distension.     Palpations: Abdomen is soft. There is no mass.  Musculoskeletal:        General: No swelling.  Skin:    General: Skin is warm.  Neurological:     General: No focal deficit present.     Mental Status: He is alert and oriented to person, place, and time.  Psychiatric:        Mood and Affect: Mood normal.        Behavior: Behavior normal.      LABORATORY DATA:  I have reviewed the labs as listed.  CBC    Component Value Date/Time   WBC 7.3 10/03/2019 1800   RBC 3.97 (L) 10/03/2019 1800   HGB 11.1 (L) 10/03/2019 1800   HCT 35.8 (L) 10/03/2019 1800   PLT 347 10/03/2019 1800   MCV 90.2 10/03/2019 1800   MCH 28.0 10/03/2019 1800   MCHC 31.0 10/03/2019 1800   RDW 14.9 10/03/2019 1800   LYMPHSABS 1.4 10/03/2019 1800   MONOABS 0.4 10/03/2019 1800   EOSABS 0.4 10/03/2019 1800   BASOSABS 0.0 10/03/2019 1800   CMP Latest Ref Rng & Units 10/03/2019 09/21/2019 08/24/2019  Glucose 70 - 99 mg/dL 156(H) 109(H) 99  BUN 8 - 23 mg/dL 11 16 12   Creatinine 0.61 - 1.24 mg/dL 0.77 0.74 0.63  Sodium 135 - 145 mmol/L 133(L) 136 140  Potassium 3.5 - 5.1 mmol/L 3.2(L) 4.1 3.9  Chloride 98 - 111 mmol/L 108 104 107  CO2 22 - 32 mmol/L 21(L) 25 25  Calcium 8.9 - 10.3 mg/dL 8.0(L) 8.4(L) 8.3(L)  Total Protein 6.5 - 8.1 g/dL - 6.7 6.8  Total Bilirubin 0.3 - 1.2 mg/dL - 1.0 0.4  Alkaline Phos 38 - 126 U/L  - 47 43  AST 15 - 41 U/L - 11(L) 9(L)  ALT 0 - 44 U/L - 9 8       DIAGNOSTIC IMAGING:  I have independently reviewed the scans and discussed with the patient.   I have reviewed Venita Lick LPN's note and agree with the documentation.  I personally performed a face-to-face visit, made revisions and my assessment and plan is as follows.    ASSESSMENT & PLAN:   Prostate cancer ([place]) 1.  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 - Fluciclovine PET CT scan in December 2019 showed lymphadenopathy in the chest and sclerotic lesion in the right ilium, diffuse enhancement of the prostate. - Enzalutamide was started on 02/08/2018 at 80 mg daily, increased to 4 tablets daily in the first week of June 2019. -He receives Lupron injections at Dr. Ralene Muskrat office. -He has intermittent hematuria from the suprapubic catheter.  Most recently he went to the ER last  night.  He still has some hematuria.  Eliquis and Plavix are on hold. -His last PSA was 0.1 on 08/24/2019.  He was tolerating enzalutamide very well. -CT of the abdomen and pelvis on 09/05/2019, done for perirectal pain showed prostate substantially increased in size compared to CT scan from May 2020, measuring 4.4 x 4.4 cm, previously 3.7 x 3.2 cm.  This is concerning for locally recurrent prostate malignancy.  No abdominal or pelvic adenopathy.  Unchanged sclerotic lesions on T12 and L1 vertebral bodies.  Right ilium has also 1 lesion.  I have reviewed the films with the patient and his daughter. -Prostate biopsy by Dr. Jeffie Pollock on 10/20/2019 consistent with moderately differentiated squamous cell carcinoma of the prostate. -This has likely arising in the setting of previous radiation therapy. -He was started on radiation therapy to the prostate 30 Gray in 10  fractions on 10/24/2019. -He reported 9 pound weight loss in the last 1 month.  He has lost significant weight from the beginning of the year as his wife passed away in 03/07/19. -Germline mutation testing shows two VUS with no targetable mutations. -Foundation 1 testing results are pending at this time. -He is taking tramadol as needed for perirectal pain. -I will see him back in 3 weeks for follow-up and discuss further plan.  2.  Bone metastasis: -He has metastatic sites on right ilium, T12 and L1 vertebral bodies. -Denosumab was started on 05/02/2018.  He is tolerating it well.  He will continue calcium supplements.  3.  Paroxysmal atrial fibrillation: -Eliquis is currently on hold from hematuria yesterday.      Orders placed this encounter:  No orders of the defined types were placed in this encounter.     Derek Jack, MD [place] [phone]

## 2019-11-03 ENCOUNTER — Other Ambulatory Visit: Payer: Self-pay

## 2019-11-03 ENCOUNTER — Ambulatory Visit
Admission: RE | Admit: 2019-11-03 | Discharge: 2019-11-03 | Disposition: A | Discharge status: Home / Self Care | Payer: Medicare HMO | Source: Ambulatory Visit | Attending: Radiation Oncology | Admitting: Radiation Oncology

## 2019-11-03 DIAGNOSIS — C61 Malignant neoplasm of prostate: Secondary | ICD-10-CM | POA: Diagnosis not present

## 2019-11-03 DIAGNOSIS — Z510 Encounter for antineoplastic radiation therapy: Secondary | ICD-10-CM | POA: Diagnosis not present

## 2019-11-03 DIAGNOSIS — K6289 Other specified diseases of anus and rectum: Secondary | ICD-10-CM | POA: Diagnosis not present

## 2019-11-06 ENCOUNTER — Other Ambulatory Visit: Payer: Self-pay

## 2019-11-06 ENCOUNTER — Ambulatory Visit
Admission: RE | Admit: 2019-11-06 | Discharge: 2019-11-06 | Disposition: A | Discharge status: Home / Self Care | Payer: Medicare HMO | Source: Ambulatory Visit | Attending: Radiation Oncology | Admitting: Radiation Oncology

## 2019-11-06 DIAGNOSIS — Z510 Encounter for antineoplastic radiation therapy: Secondary | ICD-10-CM | POA: Diagnosis not present

## 2019-11-06 DIAGNOSIS — K6289 Other specified diseases of anus and rectum: Secondary | ICD-10-CM | POA: Diagnosis not present

## 2019-11-06 DIAGNOSIS — C61 Malignant neoplasm of prostate: Secondary | ICD-10-CM | POA: Diagnosis not present

## 2019-11-07 ENCOUNTER — Ambulatory Visit
Admission: RE | Admit: 2019-11-07 | Discharge: 2019-11-07 | Disposition: A | Discharge status: Home / Self Care | Payer: Medicare HMO | Source: Ambulatory Visit | Attending: Radiation Oncology | Admitting: Radiation Oncology

## 2019-11-07 ENCOUNTER — Other Ambulatory Visit: Payer: Self-pay

## 2019-11-07 DIAGNOSIS — C61 Malignant neoplasm of prostate: Secondary | ICD-10-CM | POA: Diagnosis not present

## 2019-11-07 DIAGNOSIS — K6289 Other specified diseases of anus and rectum: Secondary | ICD-10-CM | POA: Diagnosis not present

## 2019-11-07 DIAGNOSIS — Z510 Encounter for antineoplastic radiation therapy: Secondary | ICD-10-CM | POA: Diagnosis not present

## 2019-11-08 ENCOUNTER — Ambulatory Visit
Admission: RE | Admit: 2019-11-08 | Discharge: 2019-11-08 | Disposition: A | Discharge status: Home / Self Care | Payer: Medicare HMO | Source: Ambulatory Visit | Attending: Radiation Oncology | Admitting: Radiation Oncology

## 2019-11-08 ENCOUNTER — Other Ambulatory Visit: Payer: Self-pay

## 2019-11-08 DIAGNOSIS — K6289 Other specified diseases of anus and rectum: Secondary | ICD-10-CM | POA: Diagnosis not present

## 2019-11-08 DIAGNOSIS — Z510 Encounter for antineoplastic radiation therapy: Secondary | ICD-10-CM | POA: Diagnosis not present

## 2019-11-08 DIAGNOSIS — C61 Malignant neoplasm of prostate: Secondary | ICD-10-CM | POA: Diagnosis not present

## 2019-11-09 ENCOUNTER — Ambulatory Visit
Admission: RE | Admit: 2019-11-09 | Discharge: 2019-11-09 | Disposition: A | Discharge status: Home / Self Care | Payer: Medicare HMO | Source: Ambulatory Visit | Attending: Radiation Oncology | Admitting: Radiation Oncology

## 2019-11-09 ENCOUNTER — Other Ambulatory Visit: Payer: Self-pay

## 2019-11-09 DIAGNOSIS — C61 Malignant neoplasm of prostate: Secondary | ICD-10-CM | POA: Diagnosis not present

## 2019-11-09 DIAGNOSIS — Z510 Encounter for antineoplastic radiation therapy: Secondary | ICD-10-CM | POA: Diagnosis not present

## 2019-11-09 DIAGNOSIS — K6289 Other specified diseases of anus and rectum: Secondary | ICD-10-CM | POA: Diagnosis not present

## 2019-11-13 ENCOUNTER — Other Ambulatory Visit: Payer: Self-pay

## 2019-11-13 ENCOUNTER — Encounter: Payer: Self-pay | Admitting: Medical Oncology

## 2019-11-13 ENCOUNTER — Ambulatory Visit
Admission: RE | Admit: 2019-11-13 | Discharge: 2019-11-13 | Disposition: A | Discharge status: Home / Self Care | Payer: Medicare HMO | Source: Ambulatory Visit | Attending: Radiation Oncology | Admitting: Radiation Oncology

## 2019-11-13 ENCOUNTER — Encounter: Payer: Self-pay | Admitting: Radiation Oncology

## 2019-11-13 DIAGNOSIS — Z510 Encounter for antineoplastic radiation therapy: Secondary | ICD-10-CM | POA: Diagnosis not present

## 2019-11-13 DIAGNOSIS — K6289 Other specified diseases of anus and rectum: Secondary | ICD-10-CM | POA: Diagnosis not present

## 2019-11-13 DIAGNOSIS — C61 Malignant neoplasm of prostate: Secondary | ICD-10-CM | POA: Diagnosis not present

## 2019-11-13 MED FILL — XTANDI 40 MG CAPSULE: 40 | 30 days supply | Qty: 120 | Fill #3

## 2019-11-15 ENCOUNTER — Other Ambulatory Visit (INDEPENDENT_AMBULATORY_CARE_PROVIDER_SITE_OTHER): Payer: Self-pay | Admitting: Gastroenterology

## 2019-11-15 MED ORDER — LINACLOTIDE 290 MCG PO CAPS: 290.0000 ug | ORAL_CAPSULE | Freq: Every day | ORAL | 3 refills | Status: AC

## 2019-11-19 NOTE — Assessment & Plan Note (Signed)
1.  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 - Fluciclovine PET CT scan in December 2019 showed lymphadenopathy in the chest and sclerotic lesion in the right ilium, diffuse enhancement of the prostate. - Enzalutamide was started on 02/08/2018 at 80 mg daily, increased to 4 tablets daily in the first week of June 2019. -He receives Lupron injections at Dr. Ralene Muskrat office. -He has intermittent hematuria from the suprapubic catheter.  Most recently he went to the ER last night.  He still has some hematuria.  Eliquis and Plavix are on hold. -His last PSA was 0.1 on 08/24/2019.  He was tolerating enzalutamide very well. -CT of the abdomen and pelvis on 09/05/2019, done for perirectal pain showed prostate substantially increased in size compared to CT scan from May 2020, measuring 4.4 x 4.4 cm, previously 3.7 x 3.2 cm.  This is concerning for locally recurrent prostate malignancy.  No abdominal or pelvic adenopathy.  Unchanged sclerotic lesions on T12 and L1 vertebral bodies.  Right ilium has also 1 lesion.  I have reviewed the films with the patient and his daughter. -Prostate biopsy by Dr. Jeffie Pollock on 10/20/2019 consistent with moderately differentiated squamous cell carcinoma of the prostate. -This has likely arising in the setting of previous radiation therapy. -He was started on radiation therapy to the prostate 30 Gray in 10 fractions on 10/24/2019. -He reported 9 pound weight loss in the last 1 month.  He has lost significant weight from the beginning of the year as his wife passed away in 07-Mar-2019. -Germline mutation testing shows two VUS with no targetable mutations. -Foundation 1 testing results are pending at this time. -He is taking tramadol as needed for perirectal pain. -I will see him back in 3 weeks for  follow-up and discuss further plan.  2.  Bone metastasis: -He has metastatic sites on right ilium, T12 and L1 vertebral bodies. -Denosumab was started on 05/02/2018.  He is tolerating it well.  He will continue calcium supplements.  3.  Paroxysmal atrial fibrillation: -Eliquis is currently on hold from hematuria yesterday.

## 2019-11-19 NOTE — Progress Notes (Signed)
  Radiation Oncology         [phone]) [phone] ________________________________  Name: Elijah Santana MRN: [id number]  Date: 11/13/2019  DOB: 10-10-1932  End of Treatment Note  Diagnosis:   86 y.o. 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     Indication for treatment:  Palliation       Radiation treatment dates:   12/14-12/28/20  Site/dose:   The Prostate mass was treated to 30 Gy in 10 fractions of 3 Gy  Beams/energy:   A 3D 4-field arrangement was used with 15 MV X-rays  Narrative: The patient tolerated radiation treatment relatively well.     Plan: The patient has completed radiation treatment. The patient will return to radiation oncology clinic for routine followup in one month. I advised him to call or return sooner if he has any questions or concerns related to his recovery or treatment. ________________________________  Sheral Apley. Tammi Klippel, M.D.

## 2019-11-20 ENCOUNTER — Telehealth: Payer: Self-pay | Admitting: Urology

## 2019-11-20 NOTE — Telephone Encounter (Signed)
Spoke with daughter, Juliann Pulse. Pt will hold both blood thinners and nv appt moved to this Friday for cath change per daughter request.

## 2019-11-20 NOTE — Telephone Encounter (Signed)
He needs to hold the eliquis and plavix again for the next few days to see if the hematuria will stop.

## 2019-11-20 NOTE — Telephone Encounter (Signed)
Pt daughter called and said patient still have a large amount blood in urine and requested an appt as soon as possible.

## 2019-11-20 NOTE — Telephone Encounter (Signed)
Daughter states hematuria has returned. Pt back on eliquis and plavix  and recently finished radiation. No fever.

## 2019-11-21 ENCOUNTER — Telehealth (HOSPITAL_COMMUNITY): Payer: Self-pay | Admitting: Pharmacy Technician

## 2019-11-21 NOTE — Telephone Encounter (Signed)
Oral Oncology Patient Advocate Encounter  Patient has been approved for copay assistance with [employer] [place] [place] [facility] (TAF).  The [place] will cover all copayment expenses for Xtandi for the remainder of the calendar year.    The billing information is as follows and has been shared with Narcissa.   Member ID: [id number] Group ID: [id number] PCN: [id number] BIN: [id number] Eligibility Dates: 11/17/2019 to 11/15/2020  Fund: [place] [facility] [place] Phone [phone] Fax [phone] 11/21/2019 12:33 PM

## 2019-11-22 ENCOUNTER — Telehealth: Payer: Self-pay | Admitting: Urology

## 2019-11-22 ENCOUNTER — Encounter (HOSPITAL_COMMUNITY): Payer: Self-pay | Admitting: Emergency Medicine

## 2019-11-22 ENCOUNTER — Other Ambulatory Visit: Payer: Self-pay

## 2019-11-22 DIAGNOSIS — Z7901 Long term (current) use of anticoagulants: Secondary | ICD-10-CM | POA: Insufficient documentation

## 2019-11-22 DIAGNOSIS — Z436 Encounter for attention to other artificial openings of urinary tract: Secondary | ICD-10-CM | POA: Insufficient documentation

## 2019-11-22 DIAGNOSIS — R339 Retention of urine, unspecified: Secondary | ICD-10-CM | POA: Insufficient documentation

## 2019-11-22 DIAGNOSIS — Z87891 Personal history of nicotine dependence: Secondary | ICD-10-CM | POA: Diagnosis not present

## 2019-11-22 DIAGNOSIS — R310 Gross hematuria: Secondary | ICD-10-CM | POA: Diagnosis not present

## 2019-11-22 DIAGNOSIS — Z79899 Other long term (current) drug therapy: Secondary | ICD-10-CM | POA: Insufficient documentation

## 2019-11-22 DIAGNOSIS — C7951 Secondary malignant neoplasm of bone: Secondary | ICD-10-CM | POA: Diagnosis not present

## 2019-11-22 DIAGNOSIS — Z923 Personal history of irradiation: Secondary | ICD-10-CM | POA: Diagnosis not present

## 2019-11-22 DIAGNOSIS — I10 Essential (primary) hypertension: Secondary | ICD-10-CM | POA: Diagnosis not present

## 2019-11-22 DIAGNOSIS — T83098A Other mechanical complication of other indwelling urethral catheter, initial encounter: Secondary | ICD-10-CM | POA: Diagnosis not present

## 2019-11-22 DIAGNOSIS — C61 Malignant neoplasm of prostate: Secondary | ICD-10-CM | POA: Diagnosis not present

## 2019-11-22 DIAGNOSIS — J45909 Unspecified asthma, uncomplicated: Secondary | ICD-10-CM | POA: Insufficient documentation

## 2019-11-22 NOTE — Telephone Encounter (Signed)
I spoke with daughter. Reports urine is "dark in color" pt has held blood thinners. Informed daughter the dark purple blood is old blood and to increase fluid. Daughter also reports pt has been irrigating his own catheter at times with unknown liquids and changing his bag 3 -4 times a day. She asked when he come to his nurse visit Friday if you could speak to him and address those issues.

## 2019-11-22 NOTE — ED Triage Notes (Signed)
Pt states that his foley is not draining. He states he passing clots and the urine is a dark red color.

## 2019-11-22 NOTE — ED Provider Notes (Signed)
[facility] EMERGENCY DEPARTMENT Provider Note   CSN: [id number] Arrival date & time: 11/22/19  2116   History Chief Complaint  Patient presents with  . Foley Catheter Problem    Elijah Santana is a 86 y.o. male.  The history is provided by the patient.  He has history of coronary artery disease, hyperlipidemia, hypertension, paroxysmal atrial fibrillation anticoagulated on apixaban, prostate cancer status post radiation therapy and comes in because his Foley catheter is not draining.  He has had a catheter in place for the past 6 weeks although this 1 has been in place for about 1 week.  He started passing some blood yesterday, and catheter stopped draining this afternoon.  He has noted some clots.  Past Medical History:  Diagnosis Date  . Arthritis   . Asthma    Childhood  . Chronic back pain   . Coronary atherosclerosis of native coronary artery    a. CABG 2004 with LIMA-LAD, SVG-D1, SVG-OM, SVG-PDA. b. Cath ~2010 with occ of SVG-diagonal, distal LAD and OM filiing by collaterals. c. NSTEMI 12/2014 s/p DES to SVG-RCA. d. 11/2015: DES to distal graft of the SVG-distal RCA  . Essential hypertension   . Family history of brain cancer   . Family history of pancreatic cancer   . Foley catheter in place   . GERD (gastroesophageal reflux disease)   . Hard of hearing   . Hyperglycemia   . Ischemic cardiomyopathy    a. EF 45% by cath 12/2014.  . Mixed hyperlipidemia   . NSTEMI (non-ST elevated myocardial infarction) ([place]) 01/01/15  . Paroxysmal atrial fibrillation (HCC)   . Pneumonia    hx of   . Prostate cancer [facility])    Radiation therapy 1996  . Skin cancer   . Stroke [facility])    TIA- 28 years ago     Patient Active Problem List   Diagnosis Date Noted  . Genetic testing 10/25/2019  . Family history of pancreatic cancer   . Family history of brain cancer   . Rectal pain 08/08/2019  . Lower abdominal pain 08/08/2019  . Urinary retention 03/24/2019  . Bilateral hydronephrosis  03/24/2019  . Uncontrolled hypertension 03/24/2019  . Acute lower UTI 03/24/2019  . Anemia 03/24/2019  . Bone metastasis ([place]) 04/08/2018  . Prostate cancer ([place]) 11/27/2017  . Atrial fibrillation, rapid -new 09/10/16 09/10/2016  . PAF- in setting of NSTEMI 09/10/2016  . Accelerating angina ([place])   . CAD S/P percutaneous coronary angioplasty   . Coronary artery disease with hx of myocardial infarct w/o hx of CABG 03/21/2015  . Ischemic cardiomyopathy   . Hyperglycemia   . Essential hypertension   . BPH (benign prostatic hyperplasia)   . NSTEMI- Troponin peak 6/47 12/31/2014  . S/P CABG x 4 2004 12/31/2014  . GERD (gastroesophageal reflux disease) 04/29/2012  . Mixed hyperlipidemia   . Essential hypertension, benign 08/06/2009    Past Surgical History:  Procedure Laterality Date  . CARDIAC CATHETERIZATION  01/01/2015   Procedure: CORONARY STENT INTERVENTION;  Surgeon: Peter M Martinique, MD;  Location: [facility] CATH LAB;  Service: Cardiovascular;;  SVG to PDA  . CARDIAC CATHETERIZATION N/A 11/21/2015   Procedure: Left Heart Cath and Cors/Grafts Angiography;  Surgeon: Troy Sine, MD;  Location: [place] CV LAB;  Service: Cardiovascular;  Laterality: N/A;  . CARDIAC CATHETERIZATION N/A 11/21/2015   Procedure: Coronary Stent Intervention;  Surgeon: Troy Sine, MD;  Location: [place] CV LAB;  Service: Cardiovascular;  Laterality: N/A;  .  CARDIAC CATHETERIZATION N/A 09/11/2016   Procedure: Left Heart Cath and Cors/Grafts Angiography;  Surgeon: Leonie Man, MD;  Location: [place] CV LAB;  Service: Cardiovascular;  Laterality: N/A;  . CARDIAC CATHETERIZATION N/A 09/11/2016   Procedure: Coronary Stent Intervention;  Surgeon: Leonie Man, MD;  Location: [place] CV LAB;  Service: Cardiovascular;  Laterality: N/A;  . CORONARY ARTERY BYPASS GRAFT  2004   LIMA to LAD, SVG to diagonal, SVG to circumflex, SVG to PDA  . CYSTOSCOPY WITH URETHRAL DILATATION N/A 03/28/2019    Procedure: CYSTOSCOPY WITH URETHRAL  DILATATION OF STRICTURE;  Surgeon: Irine Seal, MD;  Location: [facility] ORS;  Service: Urology;  Laterality: N/A;  . GREEN LIGHT LASER TURP (TRANSURETHRAL RESECTION OF PROSTATE N/A 06/04/2016   Procedure: GREEN LIGHT LASER TURP (TRANSURETHRAL RESECTION OF PROSTATE;  Surgeon: Irine Seal, MD;  Location: [facility] ORS;  Service: Urology;  Laterality: N/A;  . LEFT HEART CATHETERIZATION WITH CORONARY/GRAFT ANGIOGRAM N/A 01/01/2015   Procedure: LEFT HEART CATHETERIZATION WITH Beatrix Fetters;  Surgeon: Peter M Martinique, MD;  Location: [facility] CATH LAB;  Service: Cardiovascular;  Laterality: N/A;  . PERCUTANEOUS CORONARY STENT INTERVENTION (PCI-S)  11/20/2015   distal SVG  with DES       . TONSILLECTOMY         Family History  Problem Relation Age of Onset  . Hypertension Father   . Coronary artery disease Father   . Pancreatic cancer Mother 70  . Brain cancer Sister 38  . Brain cancer Nephew        dx age 43, d. 33s    Social History   Tobacco Use  . Smoking status: Former Smoker    Packs/day: 0.50    Years: 27.00    Pack years: 13.50    Types: Cigars    Start date: 07/28/1964    Quit date: 07/28/1986    Years since quitting: 33.3  . Smokeless tobacco: Former Systems developer    Types: Chew    Quit date: 08/07/2010  . Tobacco comment: never chewed up over a pack/day  Substance Use Topics  . Alcohol use: No    Alcohol/week: 0.0 standard drinks  . Drug use: No    Home Medications Prior to Admission medications   Medication Sig Start Date End Date Taking? Authorizing Provider  acetaminophen (TYLENOL) 325 MG tablet Take 2 tablets (650 mg total) by mouth every 4 (four) hours as needed for headache or mild pain. Patient not taking: Reported on 11/02/2019 09/12/16   Erlene Quan, PA-C  amLODipine (NORVASC) 5 MG tablet TAKE ONE TABLET BY MOUTH DAILY (MORNING) Patient taking differently: Take 5 mg by mouth every morning.  11/22/17   Satira Sark, MD  calcium carbonate  (OS-CAL - DOSED IN MG OF ELEMENTAL CALCIUM) 1250 (500 Ca) MG tablet Take 1 tablet by mouth daily.     [provider]  clopidogrel (PLAVIX) 75 MG tablet TAKE ONE TABLET BY MOUTH EVERY DAY (,EVENING) 06/23/19   Satira Sark, MD  denosumab (XGEVA) 120 MG/1.7ML SOLN injection Inject 120 mg into the skin once.     [provider]  ELIQUIS 5 MG TABS tablet Take 1 tablet (5 mg total) by mouth 2 (two) times daily. 06/26/19   Satira Sark, MD  isosorbide mononitrate (IMDUR) 60 MG 24 hr tablet TAKE ONE TABLET BY MOUTH DAILY (MORNING) Patient taking differently: Take 60 mg by mouth every morning.  06/23/19   Satira Sark, MD  Leuprolide Acetate (LUPRON IJ)  Inject as directed every 4 (four) months.     [provider]  linaclotide Rolan Lipa) 290 MCG CAPS capsule Take 1 capsule (290 mcg total) by mouth daily before breakfast. 11/15/19   Laurine Blazer A, PA-C  metoprolol tartrate (LOPRESSOR) 50 MG tablet Take 1 tablet (50 mg total) by mouth 2 (two) times daily. 06/23/19   Satira Sark, MD  nitroGLYCERIN (NITROSTAT) 0.4 MG SL tablet DISSOLVE ONE TABLET UNDER TONGUE EVERY 5 MINUTES UP TO 3 DOSES AS NEEDED FOR CHEST PAIN Patient not taking: Reported on 10/04/2019 03/27/19   Satira Sark, MD  pantoprazole (PROTONIX) 40 MG tablet Take 40 mg by mouth every morning. 09/19/19   [provider]  prochlorperazine (COMPAZINE) 10 MG tablet Take 1 tablet (10 mg total) by mouth every 6 (six) hours as needed for nausea or vomiting. Patient not taking: Reported on 11/02/2019 10/25/19   Derek Jack, MD  rosuvastatin (CRESTOR) 20 MG tablet Take 1 tablet (20 mg total) by mouth every evening. 06/23/19   Satira Sark, MD  STOOL SOFTENER 100 MG capsule Take 100-200 mg by mouth daily as needed for mild constipation.  01/02/19   [provider]  TOVIAZ 4 MG TB24 tablet Take 4 mg by mouth daily. 10/02/19   [provider]  traMADol (ULTRAM) 50 MG  tablet Take 0.5 tablets (25 mg total) by mouth daily as needed. 12/01/18   Lockamy, Randi L, NP-C  XTANDI 40 MG capsule TAKE 4 CAPSULES (160 MG TOTAL) BY MOUTH DAILY. Patient taking differently: Take 160 mg by mouth daily.  08/07/19   Derek Jack, MD    Allergies    Patient has no known allergies.  Review of Systems   Review of Systems  All other systems reviewed and are negative.   Physical Exam Updated Vital Signs BP 138/74   Pulse 75   Temp 97.8 F (36.6 C)   Resp 18   Ht 5\' 8"  (1.727 m)   Wt 78 kg   SpO2 100%   BMI 26.15 kg/m   Physical Exam Vitals and nursing note reviewed.   86 year old male, resting comfortably and in no acute distress. Vital signs are normal. Oxygen saturation is 100%, which is normal. Head is normocephalic and atraumatic. PERRLA, EOMI. Oropharynx is clear. Neck is nontender and supple without adenopathy or JVD. Back is nontender and there is no CVA tenderness. Lungs are clear without rales, wheezes, or rhonchi. Chest is nontender. Heart has regular rate and rhythm without murmur. Abdomen is soft, flat, nontender without masses or hepatosplenomegaly.bladder is distended.  Peristalsis is normoactive. Extremities have no cyanosis or edema, full range of motion is present. Skin is warm and dry without rash. Neurologic: Mental status is normal, cranial nerves are intact, there are no motor or sensory deficits.  ED Results / Procedures / Treatments   Labs (all labs ordered are listed, but only abnormal results are displayed) Labs Reviewed  URINALYSIS, ROUTINE W REFLEX MICROSCOPIC - Abnormal; Notable for the following components:      Result Value   Color, Urine RED (*)    APPearance TURBID (*)    Glucose, UA   (*)    Value: TEST NOT REPORTED DUE TO COLOR INTERFERENCE OF URINE PIGMENT   Hgb urine dipstick   (*)    Value: TEST NOT REPORTED DUE TO COLOR INTERFERENCE OF URINE PIGMENT   Bilirubin Urine   (*)    Value: TEST NOT REPORTED DUE  TO COLOR INTERFERENCE OF  URINE PIGMENT   Ketones, ur   (*)    Value: TEST NOT REPORTED DUE TO COLOR INTERFERENCE OF URINE PIGMENT   Protein, ur >300 (*)    Nitrite   (*)    Value: TEST NOT REPORTED DUE TO COLOR INTERFERENCE OF URINE PIGMENT   Leukocytes,Ua   (*)    Value: TEST NOT REPORTED DUE TO COLOR INTERFERENCE OF URINE PIGMENT   All other components within normal limits  URINALYSIS, MICROSCOPIC (REFLEX) - Abnormal; Notable for the following components:   Bacteria, UA RARE (*)    All other components within normal limits    Procedures Procedures   Medications Ordered in ED Medications - No data to display  ED Course  I have reviewed the triage vital signs and the nursing notes.  Pertinent labs & imaging results that were available during my care of the patient were reviewed by me and considered in my medical decision making (see chart for details).  MDM Rules/Calculators/A&P Foley catheter occluded by blood clot.  Bladder bleeding secondary to commendation of recent radiation therapy and therapeutic anticoagulation.  His current catheter is 72 [place].  Will irrigate the bladder.  Old records are reviewed confirming recent completion of radiation therapy to the prostate gland.  Following irrigation, some clots were passed and Foley catheter was draining freely.  Urinalysis showed hematuria without evidence of infection.  He was observed in the ED for 2 hours and there continued to be free flow of urine through the Foley catheter.  He states he has an appointment with his urologist later today and he is advised to keep that appointment.  Final Clinical Impression(s) / ED Diagnoses Final diagnoses:  Obstructed Foley catheter, initial encounter [facility])  Gross hematuria    Rx / DC Orders ED Discharge Orders    None       Delora Fuel, MD [id number] [phone]

## 2019-11-22 NOTE — Telephone Encounter (Signed)
Pts family called and states that the urine in his cath is starting to be dark purple with some clots. He has stopped his blood thinner.She wanted to see what needs to be done.

## 2019-11-23 ENCOUNTER — Inpatient Hospital Stay (HOSPITAL_COMMUNITY): Payer: Medicare HMO | Attending: Hematology | Admitting: Hematology

## 2019-11-23 ENCOUNTER — Inpatient Hospital Stay (HOSPITAL_COMMUNITY): Payer: Medicare HMO

## 2019-11-23 ENCOUNTER — Telehealth: Payer: Self-pay | Admitting: Urology

## 2019-11-23 ENCOUNTER — Encounter (HOSPITAL_COMMUNITY): Payer: Self-pay | Admitting: Hematology

## 2019-11-23 ENCOUNTER — Emergency Department (HOSPITAL_COMMUNITY)
Admission: EM | Admit: 2019-11-23 | Discharge: 2019-11-23 | Disposition: A | Discharge status: Home / Self Care | Payer: Medicare HMO | Attending: Emergency Medicine | Admitting: Emergency Medicine

## 2019-11-23 VITALS — BP 101/82 | HR 67 | Temp 97.3°F | Resp 18 | Wt 168.8 lb

## 2019-11-23 DIAGNOSIS — C61 Malignant neoplasm of prostate: Secondary | ICD-10-CM

## 2019-11-23 DIAGNOSIS — Z8249 Family history of ischemic heart disease and other diseases of the circulatory system: Secondary | ICD-10-CM | POA: Diagnosis not present

## 2019-11-23 DIAGNOSIS — C7951 Secondary malignant neoplasm of bone: Secondary | ICD-10-CM

## 2019-11-23 DIAGNOSIS — Z87891 Personal history of nicotine dependence: Secondary | ICD-10-CM | POA: Insufficient documentation

## 2019-11-23 DIAGNOSIS — I480 Paroxysmal atrial fibrillation: Secondary | ICD-10-CM | POA: Insufficient documentation

## 2019-11-23 DIAGNOSIS — T83091A Other mechanical complication of indwelling urethral catheter, initial encounter: Secondary | ICD-10-CM

## 2019-11-23 DIAGNOSIS — C778 Secondary and unspecified malignant neoplasm of lymph nodes of multiple regions: Secondary | ICD-10-CM | POA: Insufficient documentation

## 2019-11-23 DIAGNOSIS — Z79899 Other long term (current) drug therapy: Secondary | ICD-10-CM | POA: Insufficient documentation

## 2019-11-23 DIAGNOSIS — Z808 Family history of malignant neoplasm of other organs or systems: Secondary | ICD-10-CM | POA: Diagnosis not present

## 2019-11-23 DIAGNOSIS — Z800 Family history of malignant neoplasm of digestive organs: Secondary | ICD-10-CM | POA: Diagnosis not present

## 2019-11-23 DIAGNOSIS — R310 Gross hematuria: Secondary | ICD-10-CM

## 2019-11-23 LAB — CBC WITH DIFFERENTIAL/PLATELET
Abs Immature Granulocytes: 0.02 10*3/uL (ref 0.00–0.07)
Basophils Absolute: 0 10*3/uL (ref 0.0–0.1)
Basophils Relative: 1 %
Eosinophils Absolute: 0.1 10*3/uL (ref 0.0–0.5)
Eosinophils Relative: 2 %
HCT: 31.7 % — ABNORMAL LOW (ref 39.0–52.0)
Hemoglobin: 9.4 g/dL — ABNORMAL LOW (ref 13.0–17.0)
Immature Granulocytes: 0 %
Lymphocytes Relative: 19 %
Lymphs Abs: 1.1 10*3/uL (ref 0.7–4.0)
MCH: 26.7 pg (ref 26.0–34.0)
MCHC: 29.7 g/dL — ABNORMAL LOW (ref 30.0–36.0)
MCV: 90.1 fL (ref 80.0–100.0)
Monocytes Absolute: 0.4 10*3/uL (ref 0.1–1.0)
Monocytes Relative: 8 %
Neutro Abs: 4 10*3/uL (ref 1.7–7.7)
Neutrophils Relative %: 70 %
Platelets: 341 10*3/uL (ref 150–400)
RBC: 3.52 MIL/uL — ABNORMAL LOW (ref 4.22–5.81)
RDW: 15.8 % — ABNORMAL HIGH (ref 11.5–15.5)
WBC: 5.6 10*3/uL (ref 4.0–10.5)
nRBC: 0 % (ref 0.0–0.2)

## 2019-11-23 LAB — URINALYSIS, MICROSCOPIC (REFLEX): RBC / HPF: >50 RBC/hpf (ref 0–5)

## 2019-11-23 LAB — COMPREHENSIVE METABOLIC PANEL
ALT: 9 U/L (ref 0–44)
AST: 10 U/L — ABNORMAL LOW (ref 15–41)
Albumin: 3.3 g/dL — ABNORMAL LOW (ref 3.5–5.0)
Alkaline Phosphatase: 46 U/L (ref 38–126)
Anion gap: 8 (ref 5–15)
BUN: 13 mg/dL (ref 8–23)
CO2: 24 mmol/L (ref 22–32)
Calcium: 8.3 mg/dL — ABNORMAL LOW (ref 8.9–10.3)
Chloride: 105 mmol/L (ref 98–111)
Creatinine, Ser: 0.61 mg/dL (ref 0.61–1.24)
GFR calc Af Amer: >60 mL/min (ref >60)
GFR calc non Af Amer: >60 mL/min (ref >60)
Glucose, Bld: 123 mg/dL — ABNORMAL HIGH (ref 70–99)
Potassium: 3.9 mmol/L (ref 3.5–5.1)
Sodium: 137 mmol/L (ref 135–145)
Total Bilirubin: 0.5 mg/dL (ref 0.3–1.2)
Total Protein: 6.4 g/dL — ABNORMAL LOW (ref 6.5–8.1)

## 2019-11-23 LAB — URINALYSIS, ROUTINE W REFLEX MICROSCOPIC: Protein, ur: >300 mg/dL — AB

## 2019-11-23 LAB — PSA: Prostatic Specific Antigen: 0.1 ng/mL (ref 0.00–4.00)

## 2019-11-23 MED ORDER — DENOSUMAB 120 MG/1.7ML ~~LOC~~ SOLN
120.0000 mg | Freq: Once | SUBCUTANEOUS | Status: AC
  Administered 2019-11-23: 120 mg via SUBCUTANEOUS
  Filled 2019-11-23: qty 1.7

## 2019-11-23 NOTE — Telephone Encounter (Signed)
pts daughter states pt was seen in er last night and she wanted to discuss a few things with you.

## 2019-11-23 NOTE — Discharge Instructions (Addendum)
Return if you are having any problems. 

## 2019-11-23 NOTE — Progress Notes (Signed)
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 VUS in [facility] called c.3330G>C and VUS in [facility] called c.4369A>T identified on the [facility] CustomNext+RNAinsight panel. The report date is 10/23/2019.  The CustomNext-Cancer + RNAinsight panel  includes sequencing and/or deletion duplication testing of the following 52 genes: AIP, ALK, APC*, ATM*, AXIN2, BARD1, BMPR1A, BRCA1*, BRCA2*, BRIP1*, CDH1*, CDK4, CDKN1B, CDKN2A, CHEK2*, DICER1, LZTR1, MEN1, MLH1*, MSH2*, MSH3, MSH6*, MUTYH*, NBN, NF1*, NF2, NTHL1, PALB2*, PHOX2B, PMS2*, POT1, PRKAR1A, PTCH1, PTEN*, RAD51C*, RAD51D*, RECQL, SMAD4, SMARCA4, SMARCB1, SMARCE1, STK11, SUFU, TP53*, TSC1, TSC2 and VHL (sequencing and deletion/duplication); HOXB13, POLD1 and POLE (sequencing only); EPCAM and GREM1 (deletion/duplication only). DNA and RNA analyses performed for * genes.      CANCER STAGING: Cancer Staging No matching staging information was found for the patient.   INTERVAL HISTORY:  Mr. Mittleman 86 y.o. male seen for follow-up of metastatic prostate cancer to the bones.  He completed radiation therapy to the prostate on 11/13/2019.  He reports some suprapubic pain and is taking tramadol 50 mg twice daily.  He is also taking calcium supplements.  Chronic constipation and diarrhea is stable.  Shortness of breath on exertion is also stable.  Appetite is 50%.  Energy levels are 25%.   REVIEW OF SYSTEMS:  Review of Systems  Respiratory: Positive for shortness of breath.   Gastrointestinal: Positive for  constipation, diarrhea and nausea.  Musculoskeletal:       Suprapubic pain present.  All other systems reviewed and are negative.    PAST MEDICAL/SURGICAL HISTORY:  Past Medical History:  Diagnosis Date  . Arthritis   . Asthma    Childhood  . Chronic back pain   . Coronary atherosclerosis of native coronary artery    a. CABG 2004 with LIMA-LAD, SVG-D1, SVG-OM, SVG-PDA. b. Cath ~2010 with occ of SVG-diagonal, distal LAD and OM filiing by collaterals. c. NSTEMI 12/2014 s/p DES to SVG-RCA. d. 11/2015: DES to distal graft of the SVG-distal RCA  . Essential hypertension   . Family history of brain cancer   . Family history of pancreatic cancer   . Foley catheter in place   . GERD (gastroesophageal reflux disease)   . Hard of hearing   . Hyperglycemia   . Ischemic cardiomyopathy    a. EF 45% by cath 12/2014.  . Mixed hyperlipidemia   . NSTEMI (non-ST elevated myocardial infarction) ([place]) 01/01/15  . Paroxysmal atrial fibrillation ([facility])   . Pneumonia    hx of   . Prostate cancer [facility])    Radiation therapy 1996  . Skin cancer   . Stroke [facility])    TIA- 28 years ago    Past Surgical History:  Procedure Laterality Date  . CARDIAC CATHETERIZATION  01/01/2015   Procedure: CORONARY STENT INTERVENTION;  Surgeon: Peter M Martinique, MD;  Location: [facility] CATH LAB;  Service: Cardiovascular;;  SVG to PDA  . CARDIAC CATHETERIZATION N/A 11/21/2015   Procedure: Left Heart Cath and Cors/Grafts Angiography;  Surgeon: Troy Sine, MD;  Location: [place] CV LAB;  Service:  Cardiovascular;  Laterality: N/A;  . CARDIAC CATHETERIZATION N/A 11/21/2015   Procedure: Coronary Stent Intervention;  Surgeon: Troy Sine, MD;  Location: [place] CV LAB;  Service: Cardiovascular;  Laterality: N/A;  . CARDIAC CATHETERIZATION N/A 09/11/2016   Procedure: Left Heart Cath and Cors/Grafts Angiography;  Surgeon: Leonie Man, MD;  Location: [place] CV LAB;  Service: Cardiovascular;  Laterality: N/A;  . CARDIAC  CATHETERIZATION N/A 09/11/2016   Procedure: Coronary Stent Intervention;  Surgeon: Leonie Man, MD;  Location: [place] CV LAB;  Service: Cardiovascular;  Laterality: N/A;  . CORONARY ARTERY BYPASS GRAFT  2004   LIMA to LAD, SVG to diagonal, SVG to circumflex, SVG to PDA  . CYSTOSCOPY WITH URETHRAL DILATATION N/A 03/28/2019   Procedure: CYSTOSCOPY WITH URETHRAL  DILATATION OF STRICTURE;  Surgeon: Irine Seal, MD;  Location: [facility] ORS;  Service: Urology;  Laterality: N/A;  . GREEN LIGHT LASER TURP (TRANSURETHRAL RESECTION OF PROSTATE N/A 06/04/2016   Procedure: GREEN LIGHT LASER TURP (TRANSURETHRAL RESECTION OF PROSTATE;  Surgeon: Irine Seal, MD;  Location: [facility] ORS;  Service: Urology;  Laterality: N/A;  . LEFT HEART CATHETERIZATION WITH CORONARY/GRAFT ANGIOGRAM N/A 01/01/2015   Procedure: LEFT HEART CATHETERIZATION WITH Beatrix Fetters;  Surgeon: Peter M Martinique, MD;  Location: [facility] CATH LAB;  Service: Cardiovascular;  Laterality: N/A;  . PERCUTANEOUS CORONARY STENT INTERVENTION (PCI-S)  11/20/2015   distal SVG  with DES       . TONSILLECTOMY       SOCIAL HISTORY:  Social History   Socioeconomic History  . Marital status: Widowed    Spouse name: Not on file  . Number of children: 2  . Years of education: Not on file  . Highest education level: Not on file  Occupational History  . Occupation: Retired    Comment: Office manager  Tobacco Use  . Smoking status: Former Smoker    Packs/day: 0.50    Years: 27.00    Pack years: 13.50    Types: Cigars    Start date: 07/28/1964    Quit date: 07/28/1986    Years since quitting: 33.3  . Smokeless tobacco: Former Systems developer    Types: Chew    Quit date: 08/07/2010  . Tobacco comment: never chewed up over a pack/day  Substance and Sexual Activity  . Alcohol use: No    Alcohol/week: 0.0 standard drinks  . Drug use: No  . Sexual activity: Not Currently  Other Topics Concern  . Not on file  Social History Narrative  . Not on file    Social Determinants of Health   Financial Resource Strain:   . Difficulty of Paying Living Expenses: Not on file  Food Insecurity:   . Worried About Charity fundraiser in the Last Year: Not on file  . Ran Out of Food in the Last Year: Not on file  Transportation Needs:   . Lack of Transportation (Medical): Not on file  . Lack of Transportation (Non-Medical): Not on file  Physical Activity:   . Days of Exercise per Week: Not on file  . Minutes of Exercise per Session: Not on file  Stress:   . Feeling of Stress : Not on file  Social Connections:   . Frequency of Communication with Friends and Family: Not on file  . Frequency of Social Gatherings with Friends and Family: Not on file  . Attends Religious Services: Not on file  . Active Member of Clubs or Organizations: Not on file  .  Attends Archivist Meetings: Not on file  . Marital Status: Not on file  Intimate Partner Violence:   . Fear of Current or Ex-Partner: Not on file  . Emotionally Abused: Not on file  . Physically Abused: Not on file  . Sexually Abused: Not on file    FAMILY HISTORY:  Family History  Problem Relation Age of Onset  . Hypertension Father   . Coronary artery disease Father   . Pancreatic cancer Mother 66  . Brain cancer Sister 12  . Brain cancer Nephew        dx age 8, d. 11s    CURRENT MEDICATIONS:  Outpatient Encounter Medications as of 11/23/2019  Medication Sig Note  . amLODipine (NORVASC) 5 MG tablet TAKE ONE TABLET BY MOUTH DAILY (MORNING) (Patient taking differently: Take 5 mg by mouth every morning. )   . calcium carbonate (OS-CAL - DOSED IN MG OF ELEMENTAL CALCIUM) 1250 (500 Ca) MG tablet Take 1 tablet by mouth daily.    . clopidogrel (PLAVIX) 75 MG tablet TAKE ONE TABLET BY MOUTH EVERY DAY (,EVENING)   . denosumab (XGEVA) 120 MG/1.7ML SOLN injection Inject 120 mg into the skin once.    Marland Kitchen ELIQUIS 5 MG TABS tablet Take 1 tablet (5 mg total) by mouth 2 (two) times daily.   .  isosorbide mononitrate (IMDUR) 60 MG 24 hr tablet TAKE ONE TABLET BY MOUTH DAILY (MORNING) (Patient taking differently: Take 60 mg by mouth every morning. )   . Leuprolide Acetate (LUPRON IJ) Inject as directed every 4 (four) months.  03/26/2019: Administered by the office of Dr. Jeffie Pollock every 4 months   . linaclotide (LINZESS) 290 MCG CAPS capsule Take 1 capsule (290 mcg total) by mouth daily before breakfast.   . metoprolol tartrate (LOPRESSOR) 50 MG tablet Take 1 tablet (50 mg total) by mouth 2 (two) times daily.   . pantoprazole (PROTONIX) 40 MG tablet Take 40 mg by mouth every morning.   . rosuvastatin (CRESTOR) 20 MG tablet Take 1 tablet (20 mg total) by mouth every evening.   . TOVIAZ 4 MG TB24 tablet Take 4 mg by mouth daily.   . [DISCONTINUED] XTANDI 40 MG capsule TAKE 4 CAPSULES (160 MG TOTAL) BY MOUTH DAILY. (Patient taking differently: Take 160 mg by mouth daily. )   . acetaminophen (TYLENOL) 325 MG tablet Take 2 tablets (650 mg total) by mouth every 4 (four) hours as needed for headache or mild pain. (Patient not taking: Reported on 11/02/2019)   . nitroGLYCERIN (NITROSTAT) 0.4 MG SL tablet DISSOLVE ONE TABLET UNDER TONGUE EVERY 5 MINUTES UP TO 3 DOSES AS NEEDED FOR CHEST PAIN (Patient not taking: Reported on 10/04/2019)   . prochlorperazine (COMPAZINE) 10 MG tablet Take 1 tablet (10 mg total) by mouth every 6 (six) hours as needed for nausea or vomiting. (Patient not taking: Reported on 11/02/2019)   . STOOL SOFTENER 100 MG capsule Take 100-200 mg by mouth daily as needed for mild constipation.    . traMADol (ULTRAM) 50 MG tablet Take 0.5 tablets (25 mg total) by mouth daily as needed. (Patient not taking: Reported on 11/23/2019)    No facility-administered encounter medications on file as of 11/23/2019.    ALLERGIES:  No Known Allergies   PHYSICAL EXAM:  ECOG Performance status: 1  Vitals:   11/23/19 1108  BP: 101/82  Pulse: 67  Resp: 18  Temp: (!) 97.3 F (36.3 C)  SpO2: 100%    Filed Weights   11/23/19  1108  Weight: 168 lb 12.8 oz (76.6 kg)    Physical Exam Vitals reviewed.  Constitutional:      Appearance: Normal appearance.  Cardiovascular:     Rate and Rhythm: Normal rate and regular rhythm.     Heart sounds: Normal heart sounds.  Pulmonary:     Effort: Pulmonary effort is normal.     Breath sounds: Normal breath sounds.  Abdominal:     General: There is no distension.     Palpations: Abdomen is soft. There is no mass.  Musculoskeletal:        General: No swelling.  Skin:    General: Skin is warm.  Neurological:     General: No focal deficit present.     Mental Status: He is alert and oriented to person, place, and time.  Psychiatric:        Mood and Affect: Mood normal.        Behavior: Behavior normal.      LABORATORY DATA:  I have reviewed the labs as listed.  CBC    Component Value Date/Time   WBC 5.6 11/23/2019 1021   RBC 3.52 (L) 11/23/2019 1021   HGB 9.4 (L) 11/23/2019 1021   HCT 31.7 (L) 11/23/2019 1021   PLT 341 11/23/2019 1021   MCV 90.1 11/23/2019 1021   MCH 26.7 11/23/2019 1021   MCHC 29.7 (L) 11/23/2019 1021   RDW 15.8 (H) 11/23/2019 1021   LYMPHSABS 1.1 11/23/2019 1021   MONOABS 0.4 11/23/2019 1021   EOSABS 0.1 11/23/2019 1021   BASOSABS 0.0 11/23/2019 1021   CMP Latest Ref Rng & Units 11/23/2019 10/03/2019 09/21/2019  Glucose 70 - 99 mg/dL 123(H) 156(H) 109(H)  BUN 8 - 23 mg/dL 13 11 16   Creatinine 0.61 - 1.24 mg/dL 0.61 0.77 0.74  Sodium 135 - 145 mmol/L 137 133(L) 136  Potassium 3.5 - 5.1 mmol/L 3.9 3.2(L) 4.1  Chloride 98 - 111 mmol/L 105 108 104  CO2 22 - 32 mmol/L 24 21(L) 25  Calcium 8.9 - 10.3 mg/dL 8.3(L) 8.0(L) 8.4(L)  Total Protein 6.5 - 8.1 g/dL 6.4(L) - 6.7  Total Bilirubin 0.3 - 1.2 mg/dL 0.5 - 1.0  Alkaline Phos 38 - 126 U/L 46 - 47  AST 15 - 41 U/L 10(L) - 11(L)  ALT 0 - 44 U/L 9 - 9       DIAGNOSTIC IMAGING:  I have independently reviewed the scans and discussed with the patient.   I  have reviewed Venita Lick LPN's note and agree with the documentation.  I personally performed a face-to-face visit, made revisions and my assessment and plan is as follows.    ASSESSMENT & PLAN:   Prostate cancer ([place]) 1.  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 - Fluciclovine PET CT scan in December 2019 showed lymphadenopathy in the chest and sclerotic lesion in the right ilium, diffuse enhancement of the prostate. - Enzalutamide was started on 02/08/2018 at 80 mg daily, increased to 4 tablets daily in the first week of June 2019. -He receives Lupron injections at Dr. Ralene Muskrat office. -He has intermittent hematuria from the suprapubic catheter.  Most recently he went to the ER last night.  He still has some hematuria.  Eliquis and Plavix are on hold. -His last PSA was 0.1 on 08/24/2019.  He was tolerating enzalutamide very well. -CT of the abdomen  and pelvis on 09/05/2019, done for perirectal pain showed prostate substantially increased in size compared to CT scan from May 2020, measuring 4.4 x 4.4 cm, previously 3.7 x 3.2 cm.  This is concerning for locally recurrent prostate malignancy.  No abdominal or pelvic adenopathy.  Unchanged sclerotic lesions on T12 and L1 vertebral bodies.  Right ilium has also 1 lesion.  I have reviewed the films with the patient and his daughter. -Prostate biopsy by Dr. Jeffie Pollock on 10/20/2019 consistent with moderately differentiated squamous cell carcinoma of the prostate. -XRT to the prostate from 12/14 to 11/13/2019, 30 Gy in 10 fractions. -Germline mutation testing shows 2 VUS with no targetable mutations. -We will follow up on the foundation 1 test results.  He has an appointment with Dr. Jeffie Pollock tomorrow. -I will see him back in 1 month for follow-up.  In the meanwhile he will  continue enzalutamide.  2.  Bone metastasis: -He has bone mets to right ilium, T12 and L1 vertebral bodies. [name] started on 05/02/2018.  He will continue monthly Xgeva.  He will continue calcium supplements.  3.  Paroxysmal atrial fibrillation: -Eliquis is currently on hold from hematuria yesterday.  4.  Suprapubic pain: -This is at the suprapubic catheter site.  He is taking tramadol 50 mg twice daily which is helping.      Orders placed this encounter:  Orders Placed This Encounter  Procedures  . CBC with Differential/Platelet  . Comprehensive metabolic panel  . PSA      Derek Jack, MD [place] [phone]

## 2019-11-23 NOTE — ED Notes (Signed)
Went to d/c pt and pt was not in room.

## 2019-11-23 NOTE — Patient Instructions (Addendum)
[place] at [facility] Discharge Instructions  You were seen today by [name]. He went over your recent lab results. He discussed how you've been feeling since finishing your treatments. Keep your appointment with Dr. Jeffie Pollock tomorrow. Continue Xgeva injections. He will see you back in 1 month for labs and follow up.   Thank you for choosing [place] at [facility] to provide your oncology and hematology care.  To afford each patient quality time with our provider, please arrive at least 15 minutes before your scheduled appointment time.   If you have a lab appointment with the [facility] please come in thru the  Main Entrance and check in at the main information desk  You need to re-schedule your appointment should you arrive 10 or more minutes late.  We strive to give you quality time with our providers, and arriving late affects you and other patients whose appointments are after yours.  Also, if you no show three or more times for appointments you may be dismissed from the clinic at the providers discretion.     Again, thank you for choosing [facility]  Our hope is that these requests will decrease the amount of time that you wait before being seen by our physicians.       _____________________________________________________________  Should you have questions after your visit to [facility], please contact our office at [phone] between the hours of 8:00 a.m. and 4:30 p.m.  Voicemails left after 4:00 p.m. will not be returned until the following business day.  For prescription refill requests, have your pharmacy contact our office and allow 72 hours.    Cancer Center Support Programs:   > Cancer Support Group  2nd Tuesday of the month 1pm-2pm, Journey Room

## 2019-11-23 NOTE — Progress Notes (Signed)
Patient taking calcium as directed.  Denied tooth, jaw, and leg pain.  No recent or upcoming dental visits.  Patient tolerated injection with no complaints voiced.  Site clean and dry with no bruising or swelling noted at site.  Band aid applied.  Vss with discharge and left ambulatory with no s/s of distress noted.  

## 2019-11-23 NOTE — Telephone Encounter (Signed)
I spoke with daughter, [name], pt was seen in ER last night after pt was found by caregiver "washing his catheter out with sink water in the bathroom complaining his catheter was not working" ER staff placed a new catheter. Daughter concerned with pt attempting to manipulate his catheter. Bleeding has subsided from several days ago. Per daughter SP cath was a smaller size that was placed in ER then pt normally has. Due to pending weather for tomorrow in [facility] nurse visit was rescheduled to Tuesday 1/12. Daughter agrees with appointment.

## 2019-11-23 NOTE — Telephone Encounter (Signed)
I am sorry I didn't respond to your message about Mr. Rininger.   I am not yet in the habit of regularly checking Epic for certain messages and I don't get notifications for patient calls that I do for Staff messages and chat.  There are too many ways to communicate in Epic and it is too easy to lose tract of what is where.

## 2019-11-23 NOTE — Telephone Encounter (Signed)
Lmtrc. No answer.

## 2019-11-24 ENCOUNTER — Ambulatory Visit: Payer: Medicare HMO

## 2019-11-24 NOTE — Telephone Encounter (Signed)
I spoke to Elijah Santana and his daughter and reinforced the need for him to not mess with the catheter.  The have one of the three aides who sees him who has experience irrigating SP tubes and it might be worthwhile to make sure they have saline and a syringe for her to use if need be.

## 2019-11-28 ENCOUNTER — Ambulatory Visit: Payer: Medicare HMO

## 2019-11-29 ENCOUNTER — Ambulatory Visit: Payer: Medicare HMO

## 2019-12-01 ENCOUNTER — Other Ambulatory Visit: Payer: Self-pay

## 2019-12-01 ENCOUNTER — Ambulatory Visit: Payer: Medicare HMO | Attending: Internal Medicine

## 2019-12-01 DIAGNOSIS — Z20822 Contact with and (suspected) exposure to covid-19: Secondary | ICD-10-CM | POA: Diagnosis not present

## 2019-12-01 DIAGNOSIS — I2510 Atherosclerotic heart disease of native coronary artery without angina pectoris: Secondary | ICD-10-CM | POA: Diagnosis not present

## 2019-12-01 DIAGNOSIS — M159 Polyosteoarthritis, unspecified: Secondary | ICD-10-CM | POA: Diagnosis not present

## 2019-12-01 DIAGNOSIS — I10 Essential (primary) hypertension: Secondary | ICD-10-CM | POA: Diagnosis not present

## 2019-12-02 LAB — NOVEL CORONAVIRUS, NAA: SARS-CoV-2, NAA: NOT DETECTED

## 2019-12-04 NOTE — Progress Notes (Unsigned)
CC: I have prostate cancer.  HPI: Elijah Santana is a 86 year-old male established patient who is here evaluation for treatment of prostate cancer.  He does not have the pathology report from his biopsy. His most recent PSA is 0.25.   10/20/19: Elijah Santana returns today for a TRUS/BX for what appears to be a locally recurrent prostate cancer with minimal PSA elevation. A biopsy was requested to assess for small cell disease that might respond to chemotherapy.    07/07/2019: Pt has been undergoing routine s/p catheter exchanges at the [place] office. He continues Xtandi and is scheduled to return on 09/04 for next Lupron administration. Presents today c/o hematuria and lower abdominal pain. He began noticing blood in his catheter tubing about 3 days ago. Per Dr Ralene Muskrat previous instruction he discontinued his Eliquis yesterday. Hematuria has lightned some as of today's assessment. He also c/o lower abdominal pain that can sometimes be constant. Pain is located over the s/p area. He is taking half dose of oxybutynin 3 times a day for bladder pain and urgency. This helps him. He is not leaking around the catheter tubing. Denies fevers or chills.   05/05/19: Elijah Santana returns today in f/u. His SP tube was upsized to a 41fr on 04/20/19. He still has some discomfort from the tube and takes tylenol for that. He had excoriation and inflammation around the insertion site but that has improved. He remains on ADT and Xtandi 80mg  daily for his CRCP and his PSA was up to 0.25 on 6/17 from 0.17 on 5/20. He has no other associated signs or symptoms.   04/14/19: Elijah Santana returns today in f/u. He recent dilation of a severe prostatic urethral stricture. He has a foley and an SP tube but had the foley removed last week. He had significant incontinence so the SP tube is back to drainage. He has had a sensation of some soreness around the tube side. He has no bladder spasms. He has had persistent nausea that could be related to the  [place] he is taking for CRCP. He has a history of prostate cancer initially treated with EBRT approximately in 1998. He is on Xtandi and Lupron for what is now metastatic CRCP. He last got Lupron 30mg  on 03/14/19. His PSA was down to 0.17 on 04/05/19.   He is s/p a Greenlight prostate vaporization on 06/04/16 for obstruction. He has CRCP.    07/21/2019: No recent PSA. patient is due to lurpon today.     PAST DATA REVIEWED:  Source Of History:  Patient   05/03/19 04/05/19 02/08/19 10/17/18 05/30/18 04/08/18 02/15/18 09/23/17  PSA  Total PSA 0.25 ng/dl 0.17 ng/dl 0.22 ng/dl 0.27 ng/dl 0.36 ng/dl 0.61 ng/dl 11.57 ng/dl 19.9 ng/dl    06/15/17 11/11/16 09/15/16 06/29/16 11/01/15 02/25/15 04/30/14 08/09/13  Hormones  Testosterone, Total 3 pg/dL 14 pg/dL 45 pg/dL 36 pg/dL 33  18  36  28     PROCEDURES:          TRUSP/BX - 55700, [id number], [id number], [id number], [id number] Reason for TRUSP: Elevated PSA     The patient confirmed that he had taken his pre-procedure antibiotic. All anticoagulants were discontinued prior to the procedure. The patient emptied his bladder. He was positioned in a comfortable left lateral decubitus position with hips and knees acutely flexed.~~  Rectal Exam:      The rectal probe was inserted into the rectum without difficulty. Diagnostic scanning was performed with details below. 73ml of 2% xylocaine without epinephrine  was instilled with a spinal needle using ultrasound guidance near the junction of each seminal vesicle and the prostate for a total of 65ml.  Sequential transverse (axial) scans were made in small increments beginning at the seminal vesicles and ending at the prostatic apex. Sequential longitudinal (sagittal) scans were made in small increments beginning at the right lateral prostate and ending at the left lateral prostate. the margins of the prostate were very indistinct with infiltration of the overlying rectal wall of isoechoic tissue consistent with tumor infiltration. The  internal structures of the prostate were distorted as well and there was evidence of ECE laterally and anteriorly although that was less well defined than the posterior disease. The seminal vesicles were more isoechoic than generally seen but the structure was preserved.  Prostate Volume (elliptical): Approximately 86 grams.    6 biopsies were obtained from the right base, mid and apex and left base, mid and apex of the prostate. The rectal infiltration was woody and incorporated in the biopsies. . Excellent biopsy specimens were obtained.    The procedure was well-tolerated and without complications. The patient was told that: For several days:  he should increase his fluid intake and limit strenuous activity  he might have mild discomfort at the base of his penis or in his rectum  he might have blood in his urine or blood in his bowel movements For 2-3 months:  he might have blood in his ejaculate (semen)  Instructions were given to call the office immedicately for blood clots in the urine or bowel movements, difficulty urinating, inability to urinate, urinary retention, painful or frequent urination, fever, chills or other worrisome illness. The patient stated that he understood these instructions and would comply with them. We told the patient that prostate biopsy pathology reports are usually available within 3-5 working days, unless a pathologic second opinion is required, which may take 7-14 days. We told him to contact us to check on the status of his biopsy if he has not heard from [place] within 7 days. The patient left the ultrasound examination room in stable condition.~~    ASSESSMENT:      ICD-10 Details  1 GU:   Prostate Cancer - C61      PLAN:            Medications New Meds: Bactrim Ds 800 mg-160 mg tablet 1 tablet PO BID   #30  0 Refill(s)            Document Letter(s):  Created for Patient: Clinical Summary         Notes:   I will call him with the results.   CC:  Dr. Jerene Bears, Dr. Steffanie Dunn and Dr. Tyler Pita with the path results.

## 2019-12-04 NOTE — Progress Notes (Deleted)
H&P  Chief Complaint: ***  History of Present Illness: ***  Past Medical History:  Diagnosis Date  . Arthritis   . Asthma    Childhood  . Chronic back pain   . Coronary atherosclerosis of native coronary artery    a. CABG 2004 with LIMA-LAD, SVG-D1, SVG-OM, SVG-PDA. b. Cath ~2010 with occ of SVG-diagonal, distal LAD and OM filiing by collaterals. c. NSTEMI 12/2014 s/p DES to SVG-RCA. d. 11/2015: DES to distal graft of the SVG-distal RCA  . Essential hypertension   . Family history of brain cancer   . Family history of pancreatic cancer   . Foley catheter in place   . GERD (gastroesophageal reflux disease)   . Hard of hearing   . Hyperglycemia   . Ischemic cardiomyopathy    a. EF 45% by cath 12/2014.  . Mixed hyperlipidemia   . NSTEMI (non-ST elevated myocardial infarction) ([place]) 01/01/15  . Paroxysmal atrial fibrillation (HCC)   . Pneumonia    hx of   . Prostate cancer [facility])    Radiation therapy 1996  . Skin cancer   . Stroke [facility])    TIA- 28 years ago     Past Surgical History:  Procedure Laterality Date  . CARDIAC CATHETERIZATION  01/01/2015   Procedure: CORONARY STENT INTERVENTION;  Surgeon: Peter M Martinique, MD;  Location: [facility] CATH LAB;  Service: Cardiovascular;;  SVG to PDA  . CARDIAC CATHETERIZATION N/A 11/21/2015   Procedure: Left Heart Cath and Cors/Grafts Angiography;  Surgeon: Troy Sine, MD;  Location: [place] CV LAB;  Service: Cardiovascular;  Laterality: N/A;  . CARDIAC CATHETERIZATION N/A 11/21/2015   Procedure: Coronary Stent Intervention;  Surgeon: Troy Sine, MD;  Location: [place] CV LAB;  Service: Cardiovascular;  Laterality: N/A;  . CARDIAC CATHETERIZATION N/A 09/11/2016   Procedure: Left Heart Cath and Cors/Grafts Angiography;  Surgeon: Leonie Man, MD;  Location: [place] CV LAB;  Service: Cardiovascular;  Laterality: N/A;  . CARDIAC CATHETERIZATION N/A 09/11/2016   Procedure: Coronary Stent Intervention;  Surgeon: Leonie Man, MD;   Location: [place] CV LAB;  Service: Cardiovascular;  Laterality: N/A;  . CORONARY ARTERY BYPASS GRAFT  2004   LIMA to LAD, SVG to diagonal, SVG to circumflex, SVG to PDA  . CYSTOSCOPY WITH URETHRAL DILATATION N/A 03/28/2019   Procedure: CYSTOSCOPY WITH URETHRAL  DILATATION OF STRICTURE;  Surgeon: Irine Seal, MD;  Location: [facility] ORS;  Service: Urology;  Laterality: N/A;  . GREEN LIGHT LASER TURP (TRANSURETHRAL RESECTION OF PROSTATE N/A 06/04/2016   Procedure: GREEN LIGHT LASER TURP (TRANSURETHRAL RESECTION OF PROSTATE;  Surgeon: Irine Seal, MD;  Location: [facility] ORS;  Service: Urology;  Laterality: N/A;  . LEFT HEART CATHETERIZATION WITH CORONARY/GRAFT ANGIOGRAM N/A 01/01/2015   Procedure: LEFT HEART CATHETERIZATION WITH Beatrix Fetters;  Surgeon: Peter M Martinique, MD;  Location: [facility] CATH LAB;  Service: Cardiovascular;  Laterality: N/A;  . PERCUTANEOUS CORONARY STENT INTERVENTION (PCI-S)  11/20/2015   distal SVG  with DES       . TONSILLECTOMY      Home Medications:  Allergies as of 12/05/2019   No Known Allergies     Medication List       Accurate as of December 04, 2019  7:46 PM. If you have any questions, ask your nurse or doctor.        acetaminophen 325 MG tablet Commonly known as: TYLENOL Take 2 tablets (650 mg total) by mouth every 4 (four) hours as needed for headache  or mild pain.   amLODipine 5 MG tablet Commonly known as: NORVASC TAKE ONE TABLET BY MOUTH DAILY (MORNING) What changed: See the new instructions.   calcium carbonate 1250 (500 Ca) MG tablet Commonly known as: OS-CAL - dosed in mg of elemental calcium Take 1 tablet by mouth daily.   clopidogrel 75 MG tablet Commonly known as: PLAVIX TAKE ONE TABLET BY MOUTH EVERY DAY (,EVENING)   Eliquis 5 MG Tabs tablet Generic drug: apixaban Take 1 tablet (5 mg total) by mouth 2 (two) times daily.   isosorbide mononitrate 60 MG 24 hr tablet Commonly known as: IMDUR TAKE ONE TABLET BY MOUTH DAILY (MORNING) What  changed: See the new instructions.   linaclotide 290 MCG Caps capsule Commonly known as: Linzess Take 1 capsule (290 mcg total) by mouth daily before breakfast.   LUPRON IJ Inject as directed every 4 (four) months.   metoprolol tartrate 50 MG tablet Commonly known as: LOPRESSOR Take 1 tablet (50 mg total) by mouth 2 (two) times daily.   nitroGLYCERIN 0.4 MG SL tablet Commonly known as: NITROSTAT DISSOLVE ONE TABLET UNDER TONGUE EVERY 5 MINUTES UP TO 3 DOSES AS NEEDED FOR CHEST PAIN   pantoprazole 40 MG tablet Commonly known as: PROTONIX Take 40 mg by mouth every morning.   prochlorperazine 10 MG tablet Commonly known as: COMPAZINE Take 1 tablet (10 mg total) by mouth every 6 (six) hours as needed for nausea or vomiting.   rosuvastatin 20 MG tablet Commonly known as: CRESTOR Take 1 tablet (20 mg total) by mouth every evening.   Stool Softener 100 MG capsule Generic drug: docusate sodium Take 100-200 mg by mouth daily as needed for mild constipation.   Toviaz 4 MG Tb24 tablet Generic drug: fesoterodine Take 4 mg by mouth daily.   traMADol 50 MG tablet Commonly known as: Ultram Take 0.5 tablets (25 mg total) by mouth daily as needed.   Xgeva 120 MG/1.7ML Soln injection Generic drug: denosumab Inject 120 mg into the skin once.   Xtandi 40 MG capsule Generic drug: enzalutamide TAKE 4 CAPSULES (160 MG TOTAL) BY MOUTH DAILY. What changed: See the new instructions.       Allergies: No Known Allergies  Family History  Problem Relation Age of Onset  . Hypertension Father   . Coronary artery disease Father   . Pancreatic cancer Mother 31  . Brain cancer Sister 60  . Brain cancer Nephew        dx age 19, d. 34s    Social History:  reports that he quit smoking about 33 years ago. His smoking use included cigars. He started smoking about 55 years ago. He has a 13.50 pack-year smoking history. He quit smokeless tobacco use about 9 years ago.  His smokeless tobacco use  included chew. He reports that he does not drink alcohol or use drugs.  ROS: A complete review of systems was performed.  All systems are negative except for pertinent findings as noted.  Physical Exam:  Vital signs in last 24 hours: There were no vitals taken for this visit. Constitutional:  Alert and oriented, No acute distress Cardiovascular: Regular rate  Respiratory: Normal respiratory effort GI: Abdomen is soft, nontender, nondistended, no abdominal masses. No CVAT.  Genitourinary: Normal male phallus, testes are descended bilaterally and non-tender and without masses, scrotum is normal in appearance without lesions or masses, perineum is normal on inspection. Lymphatic: No lymphadenopathy Neurologic: Grossly intact, no focal deficits Psychiatric: Normal mood and affect  Laboratory Data:  No results for input(s): WBC, HGB, HCT, PLT in the last 72 hours.  No results for input(s): NA, K, CL, GLUCOSE, BUN, CALCIUM, CREATININE in the last 72 hours.  Invalid input(s): CO3   No results found for this or any previous visit (from the past 24 hour(s)). Recent Results (from the past 240 hour(s))  Novel Coronavirus, NAA ([facility])     Status: None   Collection Time: 12/01/19 10:31 AM   Specimen: Nasopharyngeal(NP) swabs in vial transport medium   NASOPHARYNGE  TESTING  Result Value Ref Range Status   SARS-CoV-2, NAA Not Detected Not Detected Final    Comment: This nucleic acid amplification test was developed and its performance characteristics determined by [employer]. Nucleic acid amplification tests include PCR and TMA. This test has not been FDA cleared or approved. This test has been authorized by FDA under an Emergency Use Authorization (EUA). This test is only authorized for the duration of time the declaration that circumstances exist justifying the authorization of the emergency use of in vitro diagnostic tests for detection of SARS-CoV-2 virus and/or  diagnosis of COVID-19 infection under section 564(b)(1) of the Act, 21 U.S.C. [id number]) (1), unless the authorization is terminated or revoked sooner. When diagnostic testing is negative, the possibility of a false negative result should be considered in the context of a patient's recent exposures and the presence of clinical signs and symptoms consistent with COVID-19. An individual without symptoms of COVID-19 and who is not shedding SARS-CoV-2 virus would  expect to have a negative (not detected) result in this assay.     Renal Function: No results for input(s): CREATININE in the last 168 hours. Estimated Creatinine Clearance: 65.3 mL/min (by C-G formula based on SCr of 0.61 mg/dL).  Radiologic Imaging: No results found.  Impression/Assessment:  ***  Plan:  ***

## 2019-12-05 ENCOUNTER — Other Ambulatory Visit: Payer: Self-pay

## 2019-12-05 ENCOUNTER — Ambulatory Visit (INDEPENDENT_AMBULATORY_CARE_PROVIDER_SITE_OTHER): Payer: Medicare HMO

## 2019-12-05 ENCOUNTER — Other Ambulatory Visit (HOSPITAL_COMMUNITY): Payer: Self-pay | Admitting: Hematology

## 2019-12-05 VITALS — Temp 97.9°F

## 2019-12-05 DIAGNOSIS — C61 Malignant neoplasm of prostate: Secondary | ICD-10-CM

## 2019-12-05 DIAGNOSIS — R339 Retention of urine, unspecified: Secondary | ICD-10-CM

## 2019-12-05 NOTE — Progress Notes (Signed)
Suprapubic Cath Change  Patient is present today for a suprapubic catheter change due to urinary retention.  62ml of water was drained from the balloon, a 16FR foley cath was removed from the tract with out difficulty.  Site was cleaned and prepped in a sterile fashion with betadine.  A 16FR foley cath was replaced into the tract no complications were noted. Urine return was noted, 10 ml of sterile water was inflated into the balloon and a leg bag was attached for drainage.  Patient tolerated well. A night bag was given to patient and proper instruction was given on how to switch bags.    Preformed by: Pranay Hilbun lpn  Follow up: 1 month cath change

## 2019-12-06 ENCOUNTER — Other Ambulatory Visit: Payer: Self-pay

## 2019-12-09 ENCOUNTER — Encounter (HOSPITAL_COMMUNITY): Payer: Self-pay | Admitting: Hematology

## 2019-12-09 NOTE — Assessment & Plan Note (Signed)
1.  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 - Fluciclovine PET CT scan in December 2019 showed lymphadenopathy in the chest and sclerotic lesion in the right ilium, diffuse enhancement of the prostate. - Enzalutamide was started on 02/08/2018 at 80 mg daily, increased to 4 tablets daily in the first week of June 2019. -He receives Lupron injections at Dr. Ralene Muskrat office. -He has intermittent hematuria from the suprapubic catheter.  Most recently he went to the ER last night.  He still has some hematuria.  Eliquis and Plavix are on hold. -His last PSA was 0.1 on 08/24/2019.  He was tolerating enzalutamide very well. -CT of the abdomen and pelvis on 09/05/2019, done for perirectal pain showed prostate substantially increased in size compared to CT scan from May 2020, measuring 4.4 x 4.4 cm, previously 3.7 x 3.2 cm.  This is concerning for locally recurrent prostate malignancy.  No abdominal or pelvic adenopathy.  Unchanged sclerotic lesions on T12 and L1 vertebral bodies.  Right ilium has also 1 lesion.  I have reviewed the films with the patient and his daughter. -Prostate biopsy by Dr. Jeffie Pollock on 10/20/2019 consistent with moderately differentiated squamous cell carcinoma of the prostate. -XRT to the prostate from 12/14 to 11/13/2019, 30 Gy in 10 fractions. -Germline mutation testing shows 2 VUS with no targetable mutations. -We will follow up on the foundation 1 test results.  He has an appointment with Dr. Jeffie Pollock tomorrow. -I will see him back in 1 month for follow-up.  In the meanwhile he will continue enzalutamide.  2.  Bone metastasis: -He has bone mets to right ilium, T12 and L1 vertebral bodies. [name] started on 05/02/2018.  He will continue monthly Xgeva.  He will continue calcium supplements.  3.  Paroxysmal  atrial fibrillation: -Eliquis is currently on hold from hematuria yesterday.  4.  Suprapubic pain: -This is at the suprapubic catheter site.  He is taking tramadol 50 mg twice daily which is helping.

## 2019-12-11 ENCOUNTER — Other Ambulatory Visit: Payer: Self-pay | Admitting: Cardiology

## 2019-12-12 DIAGNOSIS — C61 Malignant neoplasm of prostate: Secondary | ICD-10-CM | POA: Diagnosis not present

## 2019-12-12 DIAGNOSIS — Z6826 Body mass index (BMI) 26.0-26.9, adult: Secondary | ICD-10-CM | POA: Diagnosis not present

## 2019-12-12 DIAGNOSIS — R609 Edema, unspecified: Secondary | ICD-10-CM | POA: Diagnosis not present

## 2019-12-12 DIAGNOSIS — J069 Acute upper respiratory infection, unspecified: Secondary | ICD-10-CM | POA: Diagnosis not present

## 2019-12-12 DIAGNOSIS — Z299 Encounter for prophylactic measures, unspecified: Secondary | ICD-10-CM | POA: Diagnosis not present

## 2019-12-12 DIAGNOSIS — K219 Gastro-esophageal reflux disease without esophagitis: Secondary | ICD-10-CM | POA: Diagnosis not present

## 2019-12-13 MED FILL — XTANDI 40 MG CAPSULE: 40 | 30 days supply | Qty: 120 | Fill #0

## 2019-12-14 ENCOUNTER — Other Ambulatory Visit: Payer: Self-pay

## 2019-12-14 ENCOUNTER — Ambulatory Visit
Admission: RE | Admit: 2019-12-14 | Discharge: 2019-12-14 | Disposition: A | Discharge status: Home / Self Care | Payer: Medicare HMO | Source: Ambulatory Visit | Attending: Urology | Admitting: Urology

## 2019-12-14 ENCOUNTER — Encounter: Payer: Self-pay | Admitting: Urology

## 2019-12-14 DIAGNOSIS — C61 Malignant neoplasm of prostate: Secondary | ICD-10-CM

## 2019-12-14 NOTE — Progress Notes (Signed)
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     Interval Since Last Radiation:  4.5 weeks  12/14-12/28/20:  The Prostate mass was treated to 30 Gy in 10 fractions of 3 Gy  09/23/1995 - 11/12/1995: Prostate / 65 Gy in 35 fractions (Dr. Valere Dross)  Narrative:  I spoke with the patient to conduct his routine scheduled 1 month follow up visit via telephone to spare the patient unnecessary potential exposure in the healthcare setting during the current COVID-19 pandemic.  The patient was notified in advance and gave permission to proceed with this visit format. He tolerated radiation treatment relatively well without any significant adverse effects.                                On review of systems, the patient states that he is doing relatively well in general.  He has noted some gradual improvement in the rectal pressure/discomfort.  He continues with suprapubic discomfort associated with his suprapubic catheter but in general is tolerating this fairly well.  He has not had any recent gross hematuria, fever, chills or night sweats.  He continues with occasional constipation but this has been significantly improved since starting Linzess recently.  He reports decreased appetite and modest fatigue which he attributes to the [place] treatments that he continues taking regularly.  Otherwise, he is pleased with his progress to date.  ALLERGIES:  has No Known Allergies.  Meds: Current Outpatient Medications  Medication Sig Dispense Refill  . amLODipine (NORVASC) 5 MG tablet TAKE ONE TABLET BY MOUTH DAILY (MORNING) (Patient taking differently: Take 5 mg by  mouth every morning. ) 30 tablet 3  . calcium carbonate (OS-CAL - DOSED IN MG OF ELEMENTAL CALCIUM) 1250 (500 Ca) MG tablet Take 1 tablet by mouth daily.     Marland Kitchen denosumab (XGEVA) 120 MG/1.7ML SOLN injection Inject 120 mg into the skin once.     Marland Kitchen ELIQUIS 5 MG TABS tablet TAKE ONE TABLET BY MOUTH TWICE DAILY. (MORNING ,EVENING) 180 tablet 1  . isosorbide mononitrate (IMDUR) 60 MG 24 hr tablet TAKE ONE TABLET BY MOUTH DAILY (MORNING) (Patient taking differently: Take 60 mg by mouth every morning. ) 90 tablet 3  . Leuprolide Acetate (LUPRON IJ) Inject as directed every 4 (four) months.     . linaclotide (LINZESS) 290 MCG CAPS capsule Take 1 capsule (290 mcg total) by mouth daily before breakfast. 30 capsule 3  . metoprolol tartrate (LOPRESSOR) 50 MG tablet Take 1 tablet (50 mg total) by mouth 2 (two) times daily. 180 tablet 3  . nitroGLYCERIN (NITROSTAT) 0.4 MG SL tablet DISSOLVE ONE TABLET UNDER TONGUE EVERY 5 MINUTES UP TO 3 DOSES AS NEEDED FOR CHEST PAIN 25 tablet 3  . pantoprazole (PROTONIX) 40 MG tablet Take 40 mg by mouth every morning.    . prochlorperazine (COMPAZINE) 10 MG tablet Take 1 tablet (10 mg total) by mouth every 6 (six) hours as needed for nausea or vomiting. 45 tablet 1  . rosuvastatin (CRESTOR) 20 MG tablet Take 1 tablet (20 mg total) by mouth every evening.  90 tablet 3  . STOOL SOFTENER 100 MG capsule Take 100-200 mg by mouth daily as needed for mild constipation.     . TOVIAZ 4 MG TB24 tablet Take 4 mg by mouth daily.    . traMADol (ULTRAM) 50 MG tablet Take 0.5 tablets (25 mg total) by mouth daily as needed. 30 tablet 0  . XTANDI 40 MG capsule TAKE 4 CAPSULES (160 MG TOTAL) BY MOUTH DAILY. 120 capsule 3  . acetaminophen (TYLENOL) 325 MG tablet Take 2 tablets (650 mg total) by mouth every 4 (four) hours as needed for headache or mild pain. (Patient not taking: Reported on 11/02/2019)    . clopidogrel (PLAVIX) 75 MG tablet TAKE ONE TABLET BY MOUTH EVERY DAY (,EVENING) (Patient not  taking: Reported on 12/14/2019) 30 tablet 6   No current facility-administered medications for this encounter.    Physical Findings:  vitals were not taken for this visit.   /Unable to assess due to telephone follow-up visit format.  Lab Findings: Lab Results  Component Value Date   WBC 5.6 11/23/2019   HGB 9.4 (L) 11/23/2019   HCT 31.7 (L) 11/23/2019   MCV 90.1 11/23/2019   PLT 341 11/23/2019     Radiographic Findings: No results found.  Impression/Plan: 1. 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   He appears to be recovering well from the effects of his recent radiotherapy.  We discussed that while we are happy to continue to participate in his care if clinically indicated, at this point, we will plan to see him back on an as-needed basis.  He will continue in routine follow-up under the care and direction of Dr. Jeffie Pollock and [name] for continued management of his systemic disease and SP tube care.  The current plan is to continue with ADT, Gillermina Phy and [name].  His PSA appears to continue to respond appropriately at 0.10 when last checked on 11/23/2019.  He knows that he is welcome to call at anytime with any questions or concerns related to his recent radiotherapy.    Nicholos Johns, PA-C

## 2019-12-21 ENCOUNTER — Ambulatory Visit (HOSPITAL_COMMUNITY): Payer: Medicare HMO

## 2019-12-21 ENCOUNTER — Ambulatory Visit (HOSPITAL_COMMUNITY): Payer: Medicare HMO | Admitting: Nurse Practitioner

## 2019-12-21 ENCOUNTER — Other Ambulatory Visit (HOSPITAL_COMMUNITY): Payer: Medicare HMO

## 2019-12-22 ENCOUNTER — Inpatient Hospital Stay (HOSPITAL_BASED_OUTPATIENT_CLINIC_OR_DEPARTMENT_OTHER): Payer: Medicare HMO | Admitting: Nurse Practitioner

## 2019-12-22 ENCOUNTER — Inpatient Hospital Stay (HOSPITAL_COMMUNITY): Payer: Medicare HMO

## 2019-12-22 ENCOUNTER — Other Ambulatory Visit: Payer: Self-pay

## 2019-12-22 ENCOUNTER — Inpatient Hospital Stay (HOSPITAL_COMMUNITY): Payer: Medicare HMO | Attending: Hematology

## 2019-12-22 DIAGNOSIS — Z87891 Personal history of nicotine dependence: Secondary | ICD-10-CM | POA: Diagnosis not present

## 2019-12-22 DIAGNOSIS — C61 Malignant neoplasm of prostate: Secondary | ICD-10-CM | POA: Insufficient documentation

## 2019-12-22 DIAGNOSIS — Z7902 Long term (current) use of antithrombotics/antiplatelets: Secondary | ICD-10-CM | POA: Insufficient documentation

## 2019-12-22 DIAGNOSIS — Z79899 Other long term (current) drug therapy: Secondary | ICD-10-CM | POA: Diagnosis not present

## 2019-12-22 DIAGNOSIS — R102 Pelvic and perineal pain: Secondary | ICD-10-CM | POA: Insufficient documentation

## 2019-12-22 DIAGNOSIS — R319 Hematuria, unspecified: Secondary | ICD-10-CM | POA: Insufficient documentation

## 2019-12-22 DIAGNOSIS — Z808 Family history of malignant neoplasm of other organs or systems: Secondary | ICD-10-CM | POA: Insufficient documentation

## 2019-12-22 DIAGNOSIS — I480 Paroxysmal atrial fibrillation: Secondary | ICD-10-CM | POA: Insufficient documentation

## 2019-12-22 DIAGNOSIS — Z8673 Personal history of transient ischemic attack (TIA), and cerebral infarction without residual deficits: Secondary | ICD-10-CM | POA: Diagnosis not present

## 2019-12-22 DIAGNOSIS — Z800 Family history of malignant neoplasm of digestive organs: Secondary | ICD-10-CM | POA: Diagnosis not present

## 2019-12-22 DIAGNOSIS — K5900 Constipation, unspecified: Secondary | ICD-10-CM | POA: Diagnosis not present

## 2019-12-22 DIAGNOSIS — M1990 Unspecified osteoarthritis, unspecified site: Secondary | ICD-10-CM | POA: Insufficient documentation

## 2019-12-22 DIAGNOSIS — Z7901 Long term (current) use of anticoagulants: Secondary | ICD-10-CM | POA: Insufficient documentation

## 2019-12-22 DIAGNOSIS — M25511 Pain in right shoulder: Secondary | ICD-10-CM | POA: Insufficient documentation

## 2019-12-22 DIAGNOSIS — Z923 Personal history of irradiation: Secondary | ICD-10-CM | POA: Diagnosis not present

## 2019-12-22 DIAGNOSIS — Z85828 Personal history of other malignant neoplasm of skin: Secondary | ICD-10-CM | POA: Diagnosis not present

## 2019-12-22 DIAGNOSIS — I252 Old myocardial infarction: Secondary | ICD-10-CM | POA: Diagnosis not present

## 2019-12-22 DIAGNOSIS — E782 Mixed hyperlipidemia: Secondary | ICD-10-CM | POA: Insufficient documentation

## 2019-12-22 DIAGNOSIS — R42 Dizziness and giddiness: Secondary | ICD-10-CM | POA: Diagnosis not present

## 2019-12-22 DIAGNOSIS — C7951 Secondary malignant neoplasm of bone: Secondary | ICD-10-CM | POA: Diagnosis not present

## 2019-12-22 DIAGNOSIS — Z9079 Acquired absence of other genital organ(s): Secondary | ICD-10-CM | POA: Diagnosis not present

## 2019-12-22 DIAGNOSIS — Z8249 Family history of ischemic heart disease and other diseases of the circulatory system: Secondary | ICD-10-CM | POA: Insufficient documentation

## 2019-12-22 DIAGNOSIS — G479 Sleep disorder, unspecified: Secondary | ICD-10-CM | POA: Insufficient documentation

## 2019-12-22 LAB — COMPREHENSIVE METABOLIC PANEL
ALT: 17 U/L (ref 0–44)
AST: 18 U/L (ref 15–41)
Albumin: 3.7 g/dL (ref 3.5–5.0)
Alkaline Phosphatase: 38 U/L (ref 38–126)
Anion gap: 8 (ref 5–15)
BUN: 17 mg/dL (ref 8–23)
CO2: 26 mmol/L (ref 22–32)
Calcium: 8.8 mg/dL — ABNORMAL LOW (ref 8.9–10.3)
Chloride: 108 mmol/L (ref 98–111)
Creatinine, Ser: 0.66 mg/dL (ref 0.61–1.24)
GFR calc Af Amer: >60 mL/min (ref >60)
GFR calc non Af Amer: >60 mL/min (ref >60)
Glucose, Bld: 102 mg/dL — ABNORMAL HIGH (ref 70–99)
Potassium: 4.3 mmol/L (ref 3.5–5.1)
Sodium: 142 mmol/L (ref 135–145)
Total Bilirubin: 0.9 mg/dL (ref 0.3–1.2)
Total Protein: 6.7 g/dL (ref 6.5–8.1)

## 2019-12-22 LAB — CBC WITH DIFFERENTIAL/PLATELET
Abs Immature Granulocytes: 0.02 10*3/uL (ref 0.00–0.07)
Basophils Absolute: 0 10*3/uL (ref 0.0–0.1)
Basophils Relative: 1 %
Eosinophils Absolute: 0.3 10*3/uL (ref 0.0–0.5)
Eosinophils Relative: 5 %
HCT: 39.3 % (ref 39.0–52.0)
Hemoglobin: 11.3 g/dL — ABNORMAL LOW (ref 13.0–17.0)
Immature Granulocytes: 0 %
Lymphocytes Relative: 20 %
Lymphs Abs: 1.2 10*3/uL (ref 0.7–4.0)
MCH: 26.3 pg (ref 26.0–34.0)
MCHC: 28.8 g/dL — ABNORMAL LOW (ref 30.0–36.0)
MCV: 91.6 fL (ref 80.0–100.0)
Monocytes Absolute: 0.4 10*3/uL (ref 0.1–1.0)
Monocytes Relative: 7 %
Neutro Abs: 3.8 10*3/uL (ref 1.7–7.7)
Neutrophils Relative %: 67 %
Platelets: 270 10*3/uL (ref 150–400)
RBC: 4.29 MIL/uL (ref 4.22–5.81)
RDW: 17.2 % — ABNORMAL HIGH (ref 11.5–15.5)
WBC: 5.7 10*3/uL (ref 4.0–10.5)
nRBC: 0 % (ref 0.0–0.2)

## 2019-12-22 LAB — PSA: Prostatic Specific Antigen: 0.06 ng/mL (ref 0.00–4.00)

## 2019-12-22 MED ORDER — DENOSUMAB 120 MG/1.7ML ~~LOC~~ SOLN
120.0000 mg | Freq: Once | SUBCUTANEOUS | Status: AC
  Administered 2019-12-22: 120 mg via SUBCUTANEOUS

## 2019-12-22 MED ORDER — DENOSUMAB 120 MG/1.7ML ~~LOC~~ SOLN
SUBCUTANEOUS | Status: AC
  Filled 2019-12-22: qty 1.7

## 2019-12-22 NOTE — Patient Instructions (Signed)
[facility] at [facility] Discharge Instructions Received Xgeva injection today. Follow-up as scheduled. Call clinic for any questions or concerns   Thank you for choosing [facility] at [facility] to provide your oncology and hematology care.  To afford each patient quality time with our provider, please arrive at least 15 minutes before your scheduled appointment time.   If you have a lab appointment with the Cancer Center please come in thru the Main Entrance and check in at the main information desk.  You need to re-schedule your appointment should you arrive 10 or more minutes late.  We strive to give you quality time with our providers, and arriving late affects you and other patients whose appointments are after yours.  Also, if you no show three or more times for appointments you may be dismissed from the clinic at the providers discretion.     Again, thank you for choosing [facility].  Our hope is that these requests will decrease the amount of time that you wait before being seen by our physicians.       _____________________________________________________________  Should you have questions after your visit to [facility], please contact our office at [phone] between the hours of 8:00 a.m. and 4:30 p.m.  Voicemails left after 4:00 p.m. will not be returned until the following business day.  For prescription refill requests, have your pharmacy contact our office and allow 72 hours.    Due to Covid, you will need to wear a mask upon entering the hospital. If you do not have a mask, a mask will be given to you at the Main Entrance upon arrival. For doctor visits, patients may have 1 support person with them. For treatment visits, patients can not have anyone with them due to social distancing guidelines and our immunocompromised population.     

## 2019-12-22 NOTE — Progress Notes (Signed)
[place] reviewed with and pt seen by RLockamy NP and pt approved for Xgeva injection today per NP                                      Vaughan Basta tolerated Xgeva injection well without complaints or incident. Calcium 8.8 today and pt denied any tooth or jaw pain and no recent or future dental visits prior to administering this medication. Pt discharged via wheelchair in satisfactory condition accompanied by his caregiver

## 2019-12-22 NOTE — Patient Instructions (Signed)
[facility] at [facility] Discharge Instructions  Follow up in 1 month with labs    Thank you for choosing [facility] at [facility] to provide your oncology and hematology care.  To afford each patient quality time with our provider, please arrive at least 15 minutes before your scheduled appointment time.   If you have a lab appointment with the Cancer Center please come in thru the Main Entrance and check in at the main information desk.  You need to re-schedule your appointment should you arrive 10 or more minutes late.  We strive to give you quality time with our providers, and arriving late affects you and other patients whose appointments are after yours.  Also, if you no show three or more times for appointments you may be dismissed from the clinic at the providers discretion.     Again, thank you for choosing [facility].  Our hope is that these requests will decrease the amount of time that you wait before being seen by our physicians.       _____________________________________________________________  Should you have questions after your visit to  Cancer Center, please contact our office at [phone] between the hours of 8:00 a.m. and 4:30 p.m.  Voicemails left after 4:00 p.m. will not be returned until the following business day.  For prescription refill requests, have your pharmacy contact our office and allow 72 hours.    Due to Covid, you will need to wear a mask upon entering the hospital. If you do not have a mask, a mask will be given to you at the Main Entrance upon arrival. For doctor visits, patients may have 1 support person with them. For treatment visits, patients can not have anyone with them due to social distancing guidelines and our immunocompromised population.      

## 2019-12-22 NOTE — Assessment & Plan Note (Addendum)
1.  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 -Fluciclovine PET CT scan in 10/2018 showed lymphadenopathy in the chest and sclerotic lesions in the right ilium, diffuse enhancement of the prostate. -Enzalutamide was started on 02/09/1999 1980 mg daily, increase to 4 tablets daily in the first week of June 2019. -He receives Lupron injections at Dr. Jethro Poling office. -He has intermittent hematuria from the suprapubic catheter.  He went to the ER and he still has some hematuria.  Eliquis and Plavix are on hold. -PSA was 0.1 on 08/24/2019.  He was tolerating enzalutamide med very well. -CT AP on 09/05/2019 done for perirectal pain showed prostate substantially increased in size compared to CT scan from 04/05/2019, measuring 4.4 x 4.4 cm, previously 3.7 x 3.2 cm.  This concerning for locally recurrent prostate malignancy.  No abdominal or pelvic adenopathy.  Unchanged sclerotic lesions on T12 and L1 vertebral bodies.  Right ilium has 1 lesion. -Prostate biopsy by 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 -XRT to the prostate from 10/29/2021 11/13/2019, 30 GY in 10 fractions. -Germline mutation testing shows to the [place] with no targetable mutations. -We will follow up with foundation 1 testing which showed no targetable mutations -Labs done on 12/22/2019 showed creatinine 0.66, WBC 5.7, hemoglobin 11.3, platelets 270 -He will continue enzalutamide. -He will follow-up in 1 month with repeat labs.  2.  Bone metastasis: -She has bone mets to the right ilium, T12 and L1 vertebral bodies. [name] started on 05/02/2018. -She will continue monthly Xgeva -He will also continue taking calcium supplements.  3.  Paroxysmal atrial fibrillation: -Eliquis was on hold temporarily  for hematuria -Hematuria has resolved. -Eliquis was started back.  4.  Suprapubic pain. -This is at the suprapubic catheter site. -She is taking tramadol 50 mg twice daily which is helping.  5.  Fall: -Patient fell on 12/21/2019 feeding the birds. -He has a black right eye.  He also has right shoulder pain and right rib pain that is mild.  He declined x-rays.  The only bruising is around his eye.

## 2019-12-22 NOTE — Progress Notes (Signed)
Elijah Santana, [place] [address]   CLINIC:  Medical Oncology/Hematology  PCP:  Glenda Chroman, MD [place]  [id number] [phone]   REASON FOR VISIT: Follow-up for prostate cancer  CURRENT THERAPY: Enzalutamide  BRIEF ONCOLOGIC HISTORY:  Oncology History  Prostate cancer ([place])  11/27/2017 Initial Diagnosis   Prostate cancer ([place])    Genetic Testing   Negative genetic testing: No pathogenic variants identified. VUS in [facility] called c.3330G>C and VUS in [facility] called c.4369A>T identified on the [facility] CustomNext+RNAinsight panel. The report date is 10/23/2019.  The CustomNext-Cancer + RNAinsight panel  includes sequencing and/or deletion duplication testing of the following 52 genes: AIP, ALK, APC*, ATM*, AXIN2, BARD1, BMPR1A, BRCA1*, BRCA2*, BRIP1*, CDH1*, CDK4, CDKN1B, CDKN2A, CHEK2*, DICER1, LZTR1, MEN1, MLH1*, MSH2*, MSH3, MSH6*, MUTYH*, NBN, NF1*, NF2, NTHL1, PALB2*, PHOX2B, PMS2*, POT1, PRKAR1A, PTCH1, PTEN*, RAD51C*, RAD51D*, RECQL, SMAD4, SMARCA4, SMARCB1, SMARCE1, STK11, SUFU, TP53*, TSC1, TSC2 and VHL (sequencing and deletion/duplication); HOXB13, POLD1 and POLE (sequencing only); EPCAM and GREM1 (deletion/duplication only). DNA and RNA analyses performed for * genes.     INTERVAL HISTORY:  Mr. Skaggs 86 y.o. male returns for routine follow-up for prostate cancer.  Patient is still taking his Enzalutamide with no problems.  Patient reports he did fall last night trying to feed the birds.  He has a black eye this morning, a sore shoulder and sore ribs.  Otherwise he is doing great.  He reports his energy level has improved.  He still is not sleeping well at night. Denies any nausea, vomiting, or diarrhea. Denies any new pains. Had not noticed any recent bleeding such as epistaxis, hematuria or hematochezia. Denies recent chest pain on exertion, shortness of breath on minimal exertion, pre-syncopal episodes, or palpitations. Denies any  numbness or tingling in hands or feet. Denies any recent fevers, infections, or recent hospitalizations. Patient reports appetite at 100% and energy level at 50%.  He is eating well and maintain his weight at this time.     REVIEW OF SYSTEMS:  Review of Systems  Gastrointestinal: Positive for constipation.  Genitourinary:        Catheter  Neurological: Positive for dizziness.  Hematological: Bruises/bleeds easily.  Psychiatric/Behavioral: Positive for sleep disturbance.  All other systems reviewed and are negative.    PAST MEDICAL/SURGICAL HISTORY:  Past Medical History:  Diagnosis Date  . Arthritis   . Asthma    Childhood  . Chronic back pain   . Coronary atherosclerosis of native coronary artery    a. CABG 2004 with LIMA-LAD, SVG-D1, SVG-OM, SVG-PDA. b. Cath ~2010 with occ of SVG-diagonal, distal LAD and OM filiing by collaterals. c. NSTEMI 12/2014 s/p DES to SVG-RCA. d. 11/2015: DES to distal graft of the SVG-distal RCA  . Essential hypertension   . Family history of brain cancer   . Family history of pancreatic cancer   . Foley catheter in place   . GERD (gastroesophageal reflux disease)   . Hard of hearing   . Hyperglycemia   . Ischemic cardiomyopathy    a. EF 45% by cath 12/2014.  . Mixed hyperlipidemia   . NSTEMI (non-ST elevated myocardial infarction) ([facility]) 01/01/15  . Paroxysmal atrial fibrillation ([facility])   . Pneumonia    hx of   . Prostate cancer [facility])    Radiation therapy 1996  . Skin cancer   . Stroke [facility])    TIA- 28 years ago    Past Surgical History:  Procedure Laterality  Date  . CARDIAC CATHETERIZATION  01/01/2015   Procedure: CORONARY STENT INTERVENTION;  Surgeon: Peter M Martinique, MD;  Location: [facility] CATH LAB;  Service: Cardiovascular;;  SVG to PDA  . CARDIAC CATHETERIZATION N/A 11/21/2015   Procedure: Left Heart Cath and Cors/Grafts Angiography;  Surgeon: Troy Sine, MD;  Location: [place] CV LAB;  Service: Cardiovascular;  Laterality: N/A;  .  CARDIAC CATHETERIZATION N/A 11/21/2015   Procedure: Coronary Stent Intervention;  Surgeon: Troy Sine, MD;  Location: [place] CV LAB;  Service: Cardiovascular;  Laterality: N/A;  . CARDIAC CATHETERIZATION N/A 09/11/2016   Procedure: Left Heart Cath and Cors/Grafts Angiography;  Surgeon: Leonie Man, MD;  Location: [place] CV LAB;  Service: Cardiovascular;  Laterality: N/A;  . CARDIAC CATHETERIZATION N/A 09/11/2016   Procedure: Coronary Stent Intervention;  Surgeon: Leonie Man, MD;  Location: [place] CV LAB;  Service: Cardiovascular;  Laterality: N/A;  . CORONARY ARTERY BYPASS GRAFT  2004   LIMA to LAD, SVG to diagonal, SVG to circumflex, SVG to PDA  . CYSTOSCOPY WITH URETHRAL DILATATION N/A 03/28/2019   Procedure: CYSTOSCOPY WITH URETHRAL  DILATATION OF STRICTURE;  Surgeon: Irine Seal, MD;  Location: [facility] ORS;  Service: Urology;  Laterality: N/A;  . GREEN LIGHT LASER TURP (TRANSURETHRAL RESECTION OF PROSTATE N/A 06/04/2016   Procedure: GREEN LIGHT LASER TURP (TRANSURETHRAL RESECTION OF PROSTATE;  Surgeon: Irine Seal, MD;  Location: [facility] ORS;  Service: Urology;  Laterality: N/A;  . LEFT HEART CATHETERIZATION WITH CORONARY/GRAFT ANGIOGRAM N/A 01/01/2015   Procedure: LEFT HEART CATHETERIZATION WITH Beatrix Fetters;  Surgeon: Peter M Martinique, MD;  Location: [facility] CATH LAB;  Service: Cardiovascular;  Laterality: N/A;  . PERCUTANEOUS CORONARY STENT INTERVENTION (PCI-S)  11/20/2015   distal SVG  with DES       . TONSILLECTOMY       SOCIAL HISTORY:  Social History   Socioeconomic History  . Marital status: Widowed    Spouse name: Not on file  . Number of children: 2  . Years of education: Not on file  . Highest education level: Not on file  Occupational History  . Occupation: Retired    Comment: Office manager  Tobacco Use  . Smoking status: Former Smoker    Packs/day: 0.50    Years: 27.00    Pack years: 13.50    Types: Cigars    Start date: 07/28/1964     Quit date: 07/28/1986    Years since quitting: 33.4  . Smokeless tobacco: Former Systems developer    Types: Chew    Quit date: 08/07/2010  . Tobacco comment: never chewed up over a pack/day  Substance and Sexual Activity  . Alcohol use: No    Alcohol/week: 0.0 standard drinks  . Drug use: No  . Sexual activity: Not Currently  Other Topics Concern  . Not on file  Social History Narrative  . Not on file   Social Determinants of Health   Financial Resource Strain:   . Difficulty of Paying Living Expenses: Not on file  Food Insecurity:   . Worried About Charity fundraiser in the Last Year: Not on file  . Ran Out of Food in the Last Year: Not on file  Transportation Needs:   . Lack of Transportation (Medical): Not on file  . Lack of Transportation (Non-Medical): Not on file  Physical Activity:   . Days of Exercise per Week: Not on file  . Minutes of Exercise per Session: Not on file  Stress:   .  Feeling of Stress : Not on file  Social Connections:   . Frequency of Communication with Friends and Family: Not on file  . Frequency of Social Gatherings with Friends and Family: Not on file  . Attends Religious Services: Not on file  . Active Member of Clubs or Organizations: Not on file  . Attends Archivist Meetings: Not on file  . Marital Status: Not on file  Intimate Partner Violence:   . Fear of Current or Ex-Partner: Not on file  . Emotionally Abused: Not on file  . Physically Abused: Not on file  . Sexually Abused: Not on file    FAMILY HISTORY:  Family History  Problem Relation Age of Onset  . Hypertension Father   . Coronary artery disease Father   . Pancreatic cancer Mother 4  . Brain cancer Sister 25  . Brain cancer Nephew        dx age 31, d. 65s    CURRENT MEDICATIONS:  Outpatient Encounter Medications as of 12/22/2019  Medication Sig Note  . amLODipine (NORVASC) 5 MG tablet TAKE ONE TABLET BY MOUTH DAILY (MORNING) (Patient taking differently: Take 5 mg by mouth  every morning. )   . calcium carbonate (OS-CAL - DOSED IN MG OF ELEMENTAL CALCIUM) 1250 (500 Ca) MG tablet Take 1 tablet by mouth daily.    . clopidogrel (PLAVIX) 75 MG tablet TAKE ONE TABLET BY MOUTH EVERY DAY (,EVENING)   . denosumab (XGEVA) 120 MG/1.7ML SOLN injection Inject 120 mg into the skin once.    Marland Kitchen ELIQUIS 5 MG TABS tablet TAKE ONE TABLET BY MOUTH TWICE DAILY. (MORNING ,EVENING)   . isosorbide mononitrate (IMDUR) 60 MG 24 hr tablet TAKE ONE TABLET BY MOUTH DAILY (MORNING) (Patient taking differently: Take 60 mg by mouth every morning. )   . Leuprolide Acetate (LUPRON IJ) Inject as directed every 4 (four) months.  03/26/2019: Administered by the office of Dr. Jeffie Pollock every 4 months   . linaclotide (LINZESS) 290 MCG CAPS capsule Take 1 capsule (290 mcg total) by mouth daily before breakfast.   . metoprolol tartrate (LOPRESSOR) 50 MG tablet Take 1 tablet (50 mg total) by mouth 2 (two) times daily.   . pantoprazole (PROTONIX) 40 MG tablet Take 40 mg by mouth every morning.   . rosuvastatin (CRESTOR) 20 MG tablet Take 1 tablet (20 mg total) by mouth every evening.   Marland Kitchen STOOL SOFTENER 100 MG capsule Take 100-200 mg by mouth daily as needed for mild constipation.    . TOVIAZ 4 MG TB24 tablet Take 4 mg by mouth daily.   Gillermina Phy 40 MG capsule TAKE 4 CAPSULES (160 MG TOTAL) BY MOUTH DAILY.   Marland Kitchen acetaminophen (TYLENOL) 325 MG tablet Take 2 tablets (650 mg total) by mouth every 4 (four) hours as needed for headache or mild pain. (Patient not taking: Reported on 11/02/2019)   . nitroGLYCERIN (NITROSTAT) 0.4 MG SL tablet DISSOLVE ONE TABLET UNDER TONGUE EVERY 5 MINUTES UP TO 3 DOSES AS NEEDED FOR CHEST PAIN (Patient not taking: Reported on 12/22/2019)   . prochlorperazine (COMPAZINE) 10 MG tablet Take 1 tablet (10 mg total) by mouth every 6 (six) hours as needed for nausea or vomiting. (Patient not taking: Reported on 12/22/2019)   . traMADol (ULTRAM) 50 MG tablet Take 0.5 tablets (25 mg total) by mouth daily  as needed. (Patient not taking: Reported on 12/22/2019)    No facility-administered encounter medications on file as of 12/22/2019.    ALLERGIES:  No  Known Allergies   PHYSICAL EXAM:  ECOG Performance status: 1  Vitals:   12/22/19 0930  BP: 124/63  Pulse: 64  Resp: 18  Temp: (!) 97.3 F (36.3 C)  SpO2: 99%   Filed Weights   12/22/19 0930  Weight: 170 lb (77.1 kg)    Physical Exam Constitutional:      Appearance: Normal appearance. He is normal weight.  Eyes:     Extraocular Movements: Extraocular movements intact.     Conjunctiva/sclera: Conjunctivae normal.     Pupils: Pupils are equal, round, and reactive to light.     Comments: Right bruising due to a fall  Cardiovascular:     Rate and Rhythm: Normal rate and regular rhythm.     Heart sounds: Normal heart sounds.  Pulmonary:     Effort: Pulmonary effort is normal.     Breath sounds: Normal breath sounds.  Abdominal:     General: Bowel sounds are normal.     Palpations: Abdomen is soft.  Musculoskeletal:        General: Normal range of motion.  Skin:    General: Skin is warm.     Findings: Bruising (Right due to a fall) present.  Neurological:     Mental Status: He is alert. Mental status is at baseline.  Psychiatric:        Mood and Affect: Mood normal.        Behavior: Behavior normal.        Thought Content: Thought content normal.        Judgment: Judgment normal.      LABORATORY DATA:  I have reviewed the labs as listed.  CBC    Component Value Date/Time   WBC 5.7 12/22/2019 0856   RBC 4.29 12/22/2019 0856   HGB 11.3 (L) 12/22/2019 0856   HCT 39.3 12/22/2019 0856   PLT 270 12/22/2019 0856   MCV 91.6 12/22/2019 0856   MCH 26.3 12/22/2019 0856   MCHC 28.8 (L) 12/22/2019 0856   RDW 17.2 (H) 12/22/2019 0856   LYMPHSABS 1.2 12/22/2019 0856   MONOABS 0.4 12/22/2019 0856   EOSABS 0.3 12/22/2019 0856   BASOSABS 0.0 12/22/2019 0856   CMP Latest Ref Rng & Units 12/22/2019 11/23/2019 10/03/2019    Glucose 70 - 99 mg/dL 102(H) 123(H) 156(H)  BUN 8 - 23 mg/dL 17 13 11   Creatinine 0.61 - 1.24 mg/dL 0.66 0.61 0.77  Sodium 135 - 145 mmol/L 142 137 133(L)  Potassium 3.5 - 5.1 mmol/L 4.3 3.9 3.2(L)  Chloride 98 - 111 mmol/L 108 105 108  CO2 22 - 32 mmol/L 26 24 21(L)  Calcium 8.9 - 10.3 mg/dL 8.8(L) 8.3(L) 8.0(L)  Total Protein 6.5 - 8.1 g/dL 6.7 6.4(L) -  Total Bilirubin 0.3 - 1.2 mg/dL 0.9 0.5 -  Alkaline Phos 38 - 126 U/L 38 46 -  AST 15 - 41 U/L 18 10(L) -  ALT 0 - 44 U/L 17 9 -     I personally performed a face-to-face visit.  All questions were answered to patient's stated satisfaction. Encouraged patient to call with any new concerns or questions before his next visit to the cancer center and we can certain see him sooner, if needed.     ASSESSMENT & PLAN:   Prostate cancer ([place]) 1.  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 -Fluciclovine PET CT scan in  10/2018 showed lymphadenopathy in the chest and sclerotic lesions in the right ilium, diffuse enhancement of the prostate. -Enzalutamide was started on 02/09/1999 1980 mg daily, increase to 4 tablets daily in the first week of June 2019. -He receives Lupron injections at Dr. Jethro Poling office. -He has intermittent hematuria from the suprapubic catheter.  He went to the ER and he still has some hematuria.  Eliquis and Plavix are on hold. -PSA was 0.1 on 08/24/2019.  He was tolerating enzalutamide med very well. -CT AP on 09/05/2019 done for perirectal pain showed prostate substantially increased in size compared to CT scan from 04/05/2019, measuring 4.4 x 4.4 cm, previously 3.7 x 3.2 cm.  This concerning for locally recurrent prostate malignancy.  No abdominal or pelvic adenopathy.  Unchanged sclerotic lesions on T12 and L1 vertebral bodies.  Right  ilium has 1 lesion. -Prostate biopsy by 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 -XRT to the prostate from 10/29/2021 11/13/2019, 30 GY in 10 fractions. -Germline mutation testing shows to the [place] with no targetable mutations. -We will follow up with foundation 1 testing which showed no targetable mutations -Labs done on 12/22/2019 showed creatinine 0.66, WBC 5.7, hemoglobin 11.3, platelets 270 -He will continue enzalutamide. -He will follow-up in 1 month with repeat labs.  2.  Bone metastasis: -She has bone mets to the right ilium, T12 and L1 vertebral bodies. [name] started on 05/02/2018. -She will continue monthly Xgeva -He will also continue taking calcium supplements.  3.  Paroxysmal atrial fibrillation: -Eliquis was on hold temporarily for hematuria -Hematuria has resolved. -Eliquis was started back.  4.  Suprapubic pain. -This is at the suprapubic catheter site. -She is taking tramadol 50 mg twice daily which is helping.  5.  Fall: -Patient fell on 12/21/2019 feeding the birds. -He has a black right eye.  He also has right shoulder pain and right rib pain that is mild.  He declined x-rays.  The only bruising is around his eye.      Orders placed this encounter:  Orders Placed This Encounter  Procedures  . PSA  . Lactate dehydrogenase  . CBC with Differential/Platelet  . Comprehensive metabolic panel     Francene Finders, FNP-C [place] [phone]

## 2019-12-25 DIAGNOSIS — J069 Acute upper respiratory infection, unspecified: Secondary | ICD-10-CM | POA: Diagnosis not present

## 2019-12-25 DIAGNOSIS — H05239 Hemorrhage of unspecified orbit: Secondary | ICD-10-CM | POA: Diagnosis not present

## 2019-12-25 DIAGNOSIS — Z6825 Body mass index (BMI) 25.0-25.9, adult: Secondary | ICD-10-CM | POA: Diagnosis not present

## 2019-12-25 DIAGNOSIS — I10 Essential (primary) hypertension: Secondary | ICD-10-CM | POA: Diagnosis not present

## 2019-12-25 DIAGNOSIS — W19XXXA Unspecified fall, initial encounter: Secondary | ICD-10-CM | POA: Diagnosis not present

## 2019-12-25 DIAGNOSIS — Z299 Encounter for prophylactic measures, unspecified: Secondary | ICD-10-CM | POA: Diagnosis not present

## 2019-12-26 DIAGNOSIS — B351 Tinea unguium: Secondary | ICD-10-CM | POA: Diagnosis not present

## 2019-12-26 DIAGNOSIS — M79676 Pain in unspecified toe(s): Secondary | ICD-10-CM | POA: Diagnosis not present

## 2020-01-05 ENCOUNTER — Telehealth: Payer: Self-pay | Admitting: Cardiology

## 2020-01-05 NOTE — Telephone Encounter (Signed)

## 2020-01-07 ENCOUNTER — Encounter: Payer: Self-pay | Admitting: Cardiology

## 2020-01-07 NOTE — Progress Notes (Signed)
Virtual Visit via Telephone Note   This visit type was conducted due to national recommendations for restrictions regarding the COVID-19 Pandemic (e.g. social distancing) in an effort to limit this patient's exposure and mitigate transmission in our community.  Due to his co-morbid illnesses, this patient is at least at moderate risk for complications without adequate follow up.  This format is felt to be most appropriate for this patient at this time.  The patient did not have access to video technology/had technical difficulties with video requiring transitioning to audio format only (telephone).  All issues noted in this document were discussed and addressed.  No physical exam could be performed with this format.  Please refer to the patient's chart for his  consent to telehealth for [facility].   Date:  01/08/2020   ID:  Elijah Santana, Elijah Santana 12/30/1931, MRN [id number]  Patient Location: Home Provider Location: Home  PCP:  Glenda Chroman, MD  Cardiologist:  Rozann Lesches, MD Electrophysiologist:  None   Evaluation Performed:  Follow-Up Visit  Chief Complaint:  Cardiac follow-up  History of Present Illness:    Elijah Santana is an 86 y.o. male last seen in July 2020.  We spoke by phone today.  He does not report any recent angina symptoms or palpitations.  Still lives in his own home, his wife passed away last year.  He follows with urology and oncology with metastatic prostate cancer. He has a suprapubic catheter in place, did have some intermittent hematuria previously but this resolved with temporary hold of Eliquis and Plavix.  I reviewed his recent lab work from February as outlined below.  Recent hemoglobin was 11.3 up from 9.4.  I reviewed his cardiac medications which are outlined below.  We went over his home blood pressure checks, typically his systolic is running in the 120s to 140s.  He does not describe any dizziness.  The patient does not have symptoms concerning for  COVID-19 infection (fever, chills, cough, or new shortness of breath).    Past Medical History:  Diagnosis Date  . Arthritis   . Asthma    Childhood  . Chronic back pain   . Coronary atherosclerosis of native coronary artery    a. CABG 2004 with LIMA-LAD, SVG-D1, SVG-OM, SVG-PDA. b. Cath ~2010 with occ of SVG-diagonal, distal LAD and OM filiing by collaterals. c. NSTEMI 12/2014 s/p DES to SVG-RCA. d. 11/2015: DES to distal graft of the SVG-distal RCA  . Essential hypertension   . Family history of brain cancer   . Family history of pancreatic cancer   . Foley catheter in place   . GERD (gastroesophageal reflux disease)   . Hard of hearing   . History of pneumonia   . Hyperglycemia   . Ischemic cardiomyopathy    a. EF 45% by cath 12/2014.  . Mixed hyperlipidemia   . NSTEMI (non-ST elevated myocardial infarction) ([place]) 01/01/15  . Paroxysmal atrial fibrillation (HCC)   . Prostate cancer [facility])    Radiation therapy 1996  . Skin cancer   . Stroke [facility])    TIA- 28 years ago    Past Surgical History:  Procedure Laterality Date  . CARDIAC CATHETERIZATION  01/01/2015   Procedure: CORONARY STENT INTERVENTION;  Surgeon: Peter M Martinique, MD;  Location: [facility] CATH LAB;  Service: Cardiovascular;;  SVG to PDA  . CARDIAC CATHETERIZATION N/A 11/21/2015   Procedure: Left Heart Cath and Cors/Grafts Angiography;  Surgeon: Troy Sine, MD;  Location: [place] CV  LAB;  Service: Cardiovascular;  Laterality: N/A;  . CARDIAC CATHETERIZATION N/A 11/21/2015   Procedure: Coronary Stent Intervention;  Surgeon: Troy Sine, MD;  Location: [place] CV LAB;  Service: Cardiovascular;  Laterality: N/A;  . CARDIAC CATHETERIZATION N/A 09/11/2016   Procedure: Left Heart Cath and Cors/Grafts Angiography;  Surgeon: Leonie Man, MD;  Location: [place] CV LAB;  Service: Cardiovascular;  Laterality: N/A;  . CARDIAC CATHETERIZATION N/A 09/11/2016   Procedure: Coronary Stent Intervention;  Surgeon: Leonie Man, MD;  Location: [place] CV LAB;  Service: Cardiovascular;  Laterality: N/A;  . CORONARY ARTERY BYPASS GRAFT  2004   LIMA to LAD, SVG to diagonal, SVG to circumflex, SVG to PDA  . CYSTOSCOPY WITH URETHRAL DILATATION N/A 03/28/2019   Procedure: CYSTOSCOPY WITH URETHRAL  DILATATION OF STRICTURE;  Surgeon: Irine Seal, MD;  Location: [facility] ORS;  Service: Urology;  Laterality: N/A;  . GREEN LIGHT LASER TURP (TRANSURETHRAL RESECTION OF PROSTATE N/A 06/04/2016   Procedure: GREEN LIGHT LASER TURP (TRANSURETHRAL RESECTION OF PROSTATE;  Surgeon: Irine Seal, MD;  Location: [facility] ORS;  Service: Urology;  Laterality: N/A;  . LEFT HEART CATHETERIZATION WITH CORONARY/GRAFT ANGIOGRAM N/A 01/01/2015   Procedure: LEFT HEART CATHETERIZATION WITH Beatrix Fetters;  Surgeon: Peter M Martinique, MD;  Location: [facility] CATH LAB;  Service: Cardiovascular;  Laterality: N/A;  . PERCUTANEOUS CORONARY STENT INTERVENTION (PCI-S)  11/20/2015   distal SVG  with DES       . TONSILLECTOMY       Current Meds  Medication Sig  . acetaminophen (TYLENOL) 325 MG tablet Take 2 tablets (650 mg total) by mouth every 4 (four) hours as needed for headache or mild pain.  Marland Kitchen amLODipine (NORVASC) 5 MG tablet TAKE ONE TABLET BY MOUTH DAILY (MORNING) (Patient taking differently: Take 5 mg by mouth every morning. )  . calcium carbonate (OS-CAL - DOSED IN MG OF ELEMENTAL CALCIUM) 1250 (500 Ca) MG tablet Take 1 tablet by mouth daily.   . clopidogrel (PLAVIX) 75 MG tablet TAKE ONE TABLET BY MOUTH EVERY DAY (,EVENING)  . denosumab (XGEVA) 120 MG/1.7ML SOLN injection Inject 120 mg into the skin once.   Marland Kitchen ELIQUIS 5 MG TABS tablet TAKE ONE TABLET BY MOUTH TWICE DAILY. (MORNING ,EVENING)  . isosorbide mononitrate (IMDUR) 60 MG 24 hr tablet TAKE ONE TABLET BY MOUTH DAILY (MORNING) (Patient taking differently: Take 60 mg by mouth every morning. )  . Leuprolide Acetate (LUPRON IJ) Inject as directed every 4 (four) months.   . linaclotide (LINZESS) 290  MCG CAPS capsule Take 1 capsule (290 mcg total) by mouth daily before breakfast.  . metoprolol tartrate (LOPRESSOR) 50 MG tablet Take 1 tablet (50 mg total) by mouth 2 (two) times daily.  . nitroGLYCERIN (NITROSTAT) 0.4 MG SL tablet DISSOLVE ONE TABLET UNDER TONGUE EVERY 5 MINUTES UP TO 3 DOSES AS NEEDED FOR CHEST PAIN  . pantoprazole (PROTONIX) 40 MG tablet Take 40 mg by mouth every morning.  . rosuvastatin (CRESTOR) 20 MG tablet Take 1 tablet (20 mg total) by mouth every evening.  Marland Kitchen STOOL SOFTENER 100 MG capsule Take 100-200 mg by mouth daily as needed for mild constipation.   . traMADol (ULTRAM) 50 MG tablet Take 0.5 tablets (25 mg total) by mouth daily as needed.  Gillermina Phy 40 MG capsule TAKE 4 CAPSULES (160 MG TOTAL) BY MOUTH DAILY.     Allergies:   Patient has no known allergies.   ROS:   Chronic hearing loss.   Prior  CV studies:   The following studies were reviewed today:  Cardiac catheterization 09/11/2016:  SVG-OM3: Culprit Tandem lesions - Mid Graft to Dist Graft lesion, 70 %stenosed. Dist Graft to Insertion lesion, 95 %stenosed.  A STENT PROMUS PREM MR 2.75X38 drug eluting stent was successfully placed covering both lesions. Post intervention, there is a 0% residual stenosis.   Additional Angiography:  Ost LAD lesion, 99 %stenosed. Prox LAD to Mid LAD lesion, 100 %stenosed after small D1.  LIMA-LAD and is normal in caliber. The LAD is grafted in between 2 diagonal branches. Downstream below the D3, Dist LAD lesion, 100 %stenosed - fills via collaterals from the circumflex system.  Prox Cx to Mid Cx lesion, 100 %stenosed. Ramus lesion, 99 %stenosed. - No change from previous cath  Prox RCA lesion, 100 %stenosed. Mid RCA to Dist RCA lesion, 100 %stenosed. The vessels actually more occluded than previously noted  SVG-dRCA: Prox Graft to Mid Graft stent, ~15 %stenosed, Dist Graft stent, 0 %stenosed.  SVG-Diag Origin lesion, 100 %stenosed.  The left ventricular systolic  function is low normal. The left ventricular ejection fraction is 50-55% by visual estimate - there is a hint of possible mild anterolateral hypokinesis  LV end diastolic pressure is mildly elevated.  Successful PCI of tandem lesions in the SVG-OM 3. Otherwise angiographically similar coronary arteries and grafts compared to catheterization in January 2017.  Labs/Other Tests and Data Reviewed:    EKG:  An ECG dated 03/23/2019 was personally reviewed today and demonstrated:  Normal sinus rhythm.  Recent Labs: 12/22/2019: ALT 17; BUN 17; Creatinine, Ser 0.66; Hemoglobin 11.3; Platelets 270; Potassium 4.3; Sodium 142    Wt Readings from Last 3 Encounters:  01/08/20 162 lb (73.5 kg)  12/22/19 170 lb (77.1 kg)  11/23/19 168 lb 12.8 oz (76.6 kg)     Objective:    Vital Signs:  BP (!) 144/54   Pulse 75   Ht 5\' 8"  (1.727 m)   Wt 162 lb (73.5 kg)   BMI 24.63 kg/m    Patient spoke in full sentences, not short of breath. No audible wheezing or coughing. Speech pattern normal.  ASSESSMENT & PLAN:    1.  Multivessel CAD status post CABG.  He has graft disease and has undergone multiple percutaneous coronary interventions, most recently in 2017.  Fortunately, he remains clinically stable without progressive angina on medical therapy.  Continue Norvasc, Plavix, Imdur, Lopressor, and Crestor.  He has as needed nitroglycerin available.  2.  Mixed hyperlipidemia, he remains on Crestor without intolerances.  Lipids have been followed by Dr. Woody Seller.  3.  Essential hypertension, systolic in the 0000000 today, but generally in the 120-140 range by his checks at home.  No changes made today.  4.  Paroxysmal atrial fibrillation.  He does not report any progressive palpitations.  Continue Eliquis for stroke prophylaxis.  I reviewed his recent lab work.   Time:   Today, I have spent 7 minutes with the patient with telehealth technology discussing the above problems.     Medication Adjustments/Labs and  Tests Ordered: Current medicines are reviewed at length with the patient today.  Concerns regarding medicines are outlined above.   Tests Ordered: No orders of the defined types were placed in this encounter.   Medication Changes: No orders of the defined types were placed in this encounter.   Follow Up:  In Person 6 months in the [place] or [place] office.  Signed, Rozann Lesches, MD  01/08/2020 9:30 AM  [facility]

## 2020-01-08 ENCOUNTER — Encounter: Payer: Self-pay | Admitting: Cardiology

## 2020-01-08 ENCOUNTER — Telehealth (INDEPENDENT_AMBULATORY_CARE_PROVIDER_SITE_OTHER): Payer: Medicare HMO | Admitting: Cardiology

## 2020-01-08 VITALS — BP 144/54 | HR 75 | Ht 68.0 in | Wt 162.0 lb

## 2020-01-08 DIAGNOSIS — I480 Paroxysmal atrial fibrillation: Secondary | ICD-10-CM

## 2020-01-08 DIAGNOSIS — E782 Mixed hyperlipidemia: Secondary | ICD-10-CM | POA: Diagnosis not present

## 2020-01-08 DIAGNOSIS — I10 Essential (primary) hypertension: Secondary | ICD-10-CM

## 2020-01-08 DIAGNOSIS — I25119 Atherosclerotic heart disease of native coronary artery with unspecified angina pectoris: Secondary | ICD-10-CM

## 2020-01-08 MED FILL — XTANDI 40 MG CAPSULE: 40 | 30 days supply | Qty: 120 | Fill #1

## 2020-01-08 NOTE — Patient Instructions (Signed)
Medication Instructions:  Your physician recommends that you continue on your current medications as directed. Please refer to the Current Medication list given to you today.  *If you need a refill on your cardiac medications before your next appointment, please call your pharmacy*  Lab Work: NONE  If you have labs (blood work) drawn today and your tests are completely normal, you will receive your results only by: . MyChart Message (if you have MyChart) OR . A paper copy in the mail If you have any lab test that is abnormal or we need to change your treatment, we will call you to review the results.  Testing/Procedures: NONE   Follow-Up: At [facility], you and your health needs are our priority.  As part of our continuing mission to provide you with exceptional heart care, we have created designated Provider Care Teams.  These Care Teams include your primary Cardiologist (physician) and Advanced Practice Providers (APPs -  Physician Assistants and Nurse Practitioners) who all work together to provide you with the care you need, when you need it.  Your next appointment:   6 month(s)  The format for your next appointment:   In Person  Provider:   Samuel McDowell, MD  Other Instructions Thank you for choosing [facility]!    

## 2020-01-09 ENCOUNTER — Other Ambulatory Visit: Payer: Self-pay

## 2020-01-09 ENCOUNTER — Ambulatory Visit (INDEPENDENT_AMBULATORY_CARE_PROVIDER_SITE_OTHER): Payer: Medicare HMO

## 2020-01-09 DIAGNOSIS — C61 Malignant neoplasm of prostate: Secondary | ICD-10-CM | POA: Diagnosis not present

## 2020-01-09 DIAGNOSIS — Z9359 Other cystostomy status: Secondary | ICD-10-CM | POA: Diagnosis not present

## 2020-01-09 DIAGNOSIS — Z6825 Body mass index (BMI) 25.0-25.9, adult: Secondary | ICD-10-CM | POA: Diagnosis not present

## 2020-01-09 DIAGNOSIS — C7951 Secondary malignant neoplasm of bone: Secondary | ICD-10-CM | POA: Diagnosis not present

## 2020-01-09 DIAGNOSIS — R339 Retention of urine, unspecified: Secondary | ICD-10-CM | POA: Diagnosis not present

## 2020-01-09 DIAGNOSIS — Z299 Encounter for prophylactic measures, unspecified: Secondary | ICD-10-CM | POA: Diagnosis not present

## 2020-01-09 DIAGNOSIS — I10 Essential (primary) hypertension: Secondary | ICD-10-CM | POA: Diagnosis not present

## 2020-01-09 DIAGNOSIS — G4700 Insomnia, unspecified: Secondary | ICD-10-CM | POA: Diagnosis not present

## 2020-01-09 NOTE — Progress Notes (Signed)
Suprapubic Cath Change  Patient is present today for a suprapubic catheter change due to urinary retention.  10 ml of water was drained from the balloon, a 16 FR foley cath was removed from the tract with out difficulty.  Site was cleaned and prepped in a sterile fashion with betadine.  A 16 FR foley cath was replaced into the tract no complications were noted. Urine return was noted, 10 ml of sterile water was inflated into the balloon and a leg bag was attached for drainage.  Patient tolerated well.     Preformed by: H. Nataley Bahri LPN  Follow up: 4 wk cath change

## 2020-01-12 DIAGNOSIS — I2510 Atherosclerotic heart disease of native coronary artery without angina pectoris: Secondary | ICD-10-CM | POA: Diagnosis not present

## 2020-01-12 DIAGNOSIS — M159 Polyosteoarthritis, unspecified: Secondary | ICD-10-CM | POA: Diagnosis not present

## 2020-01-12 DIAGNOSIS — I10 Essential (primary) hypertension: Secondary | ICD-10-CM | POA: Diagnosis not present

## 2020-01-22 ENCOUNTER — Inpatient Hospital Stay (HOSPITAL_COMMUNITY): Payer: Medicare HMO | Attending: Hematology

## 2020-01-22 ENCOUNTER — Inpatient Hospital Stay (HOSPITAL_COMMUNITY): Payer: Medicare HMO

## 2020-01-22 ENCOUNTER — Other Ambulatory Visit: Payer: Self-pay

## 2020-01-22 ENCOUNTER — Inpatient Hospital Stay (HOSPITAL_BASED_OUTPATIENT_CLINIC_OR_DEPARTMENT_OTHER): Payer: Medicare HMO | Admitting: Hematology

## 2020-01-22 ENCOUNTER — Encounter (HOSPITAL_COMMUNITY): Payer: Self-pay | Admitting: Hematology

## 2020-01-22 VITALS — BP 134/68 | HR 67 | Temp 96.9°F | Resp 18 | Wt 172.2 lb

## 2020-01-22 DIAGNOSIS — M1990 Unspecified osteoarthritis, unspecified site: Secondary | ICD-10-CM | POA: Insufficient documentation

## 2020-01-22 DIAGNOSIS — R200 Anesthesia of skin: Secondary | ICD-10-CM | POA: Insufficient documentation

## 2020-01-22 DIAGNOSIS — I2510 Atherosclerotic heart disease of native coronary artery without angina pectoris: Secondary | ICD-10-CM | POA: Diagnosis not present

## 2020-01-22 DIAGNOSIS — I480 Paroxysmal atrial fibrillation: Secondary | ICD-10-CM | POA: Insufficient documentation

## 2020-01-22 DIAGNOSIS — C7951 Secondary malignant neoplasm of bone: Secondary | ICD-10-CM | POA: Insufficient documentation

## 2020-01-22 DIAGNOSIS — Z8249 Family history of ischemic heart disease and other diseases of the circulatory system: Secondary | ICD-10-CM | POA: Diagnosis not present

## 2020-01-22 DIAGNOSIS — Z9079 Acquired absence of other genital organ(s): Secondary | ICD-10-CM | POA: Insufficient documentation

## 2020-01-22 DIAGNOSIS — E782 Mixed hyperlipidemia: Secondary | ICD-10-CM | POA: Diagnosis not present

## 2020-01-22 DIAGNOSIS — M549 Dorsalgia, unspecified: Secondary | ICD-10-CM | POA: Insufficient documentation

## 2020-01-22 DIAGNOSIS — M545 Low back pain: Secondary | ICD-10-CM | POA: Insufficient documentation

## 2020-01-22 DIAGNOSIS — C778 Secondary and unspecified malignant neoplasm of lymph nodes of multiple regions: Secondary | ICD-10-CM | POA: Insufficient documentation

## 2020-01-22 DIAGNOSIS — Z808 Family history of malignant neoplasm of other organs or systems: Secondary | ICD-10-CM | POA: Diagnosis not present

## 2020-01-22 DIAGNOSIS — C61 Malignant neoplasm of prostate: Secondary | ICD-10-CM

## 2020-01-22 DIAGNOSIS — Z8673 Personal history of transient ischemic attack (TIA), and cerebral infarction without residual deficits: Secondary | ICD-10-CM | POA: Diagnosis not present

## 2020-01-22 DIAGNOSIS — Z7901 Long term (current) use of anticoagulants: Secondary | ICD-10-CM | POA: Insufficient documentation

## 2020-01-22 DIAGNOSIS — I252 Old myocardial infarction: Secondary | ICD-10-CM | POA: Diagnosis not present

## 2020-01-22 DIAGNOSIS — Z79899 Other long term (current) drug therapy: Secondary | ICD-10-CM | POA: Insufficient documentation

## 2020-01-22 DIAGNOSIS — Z87891 Personal history of nicotine dependence: Secondary | ICD-10-CM | POA: Diagnosis not present

## 2020-01-22 DIAGNOSIS — Z85828 Personal history of other malignant neoplasm of skin: Secondary | ICD-10-CM | POA: Insufficient documentation

## 2020-01-22 DIAGNOSIS — Z800 Family history of malignant neoplasm of digestive organs: Secondary | ICD-10-CM | POA: Diagnosis not present

## 2020-01-22 DIAGNOSIS — G479 Sleep disorder, unspecified: Secondary | ICD-10-CM | POA: Diagnosis not present

## 2020-01-22 DIAGNOSIS — R42 Dizziness and giddiness: Secondary | ICD-10-CM | POA: Insufficient documentation

## 2020-01-22 LAB — COMPREHENSIVE METABOLIC PANEL
ALT: 9 U/L (ref 0–44)
AST: 11 U/L — ABNORMAL LOW (ref 15–41)
Albumin: 3.5 g/dL (ref 3.5–5.0)
Alkaline Phosphatase: 47 U/L (ref 38–126)
Anion gap: 9 (ref 5–15)
BUN: 17 mg/dL (ref 8–23)
CO2: 27 mmol/L (ref 22–32)
Calcium: 8.8 mg/dL — ABNORMAL LOW (ref 8.9–10.3)
Chloride: 104 mmol/L (ref 98–111)
Creatinine, Ser: 0.58 mg/dL — ABNORMAL LOW (ref 0.61–1.24)
GFR calc Af Amer: >60 mL/min (ref >60)
GFR calc non Af Amer: >60 mL/min (ref >60)
Glucose, Bld: 108 mg/dL — ABNORMAL HIGH (ref 70–99)
Potassium: 3.6 mmol/L (ref 3.5–5.1)
Sodium: 140 mmol/L (ref 135–145)
Total Bilirubin: 0.6 mg/dL (ref 0.3–1.2)
Total Protein: 6.9 g/dL (ref 6.5–8.1)

## 2020-01-22 LAB — CBC WITH DIFFERENTIAL/PLATELET
Abs Immature Granulocytes: 0.02 10*3/uL (ref 0.00–0.07)
Basophils Absolute: 0 10*3/uL (ref 0.0–0.1)
Basophils Relative: 1 %
Eosinophils Absolute: 0.2 10*3/uL (ref 0.0–0.5)
Eosinophils Relative: 4 %
HCT: 38.5 % — ABNORMAL LOW (ref 39.0–52.0)
Hemoglobin: 11.3 g/dL — ABNORMAL LOW (ref 13.0–17.0)
Immature Granulocytes: 0 %
Lymphocytes Relative: 18 %
Lymphs Abs: 1.1 10*3/uL (ref 0.7–4.0)
MCH: 25.9 pg — ABNORMAL LOW (ref 26.0–34.0)
MCHC: 29.4 g/dL — ABNORMAL LOW (ref 30.0–36.0)
MCV: 88.3 fL (ref 80.0–100.0)
Monocytes Absolute: 0.4 10*3/uL (ref 0.1–1.0)
Monocytes Relative: 6 %
Neutro Abs: 4.4 10*3/uL (ref 1.7–7.7)
Neutrophils Relative %: 71 %
Platelets: 303 10*3/uL (ref 150–400)
RBC: 4.36 MIL/uL (ref 4.22–5.81)
RDW: 16.1 % — ABNORMAL HIGH (ref 11.5–15.5)
WBC: 6.2 10*3/uL (ref 4.0–10.5)
nRBC: 0 % (ref 0.0–0.2)

## 2020-01-22 LAB — PSA: Prostatic Specific Antigen: 0.05 ng/mL (ref 0.00–4.00)

## 2020-01-22 LAB — LACTATE DEHYDROGENASE: LDH: 135 U/L (ref 98–192)

## 2020-01-22 MED ORDER — DENOSUMAB 120 MG/1.7ML ~~LOC~~ SOLN
120.0000 mg | Freq: Once | SUBCUTANEOUS | Status: AC
  Administered 2020-01-22: 120 mg via SUBCUTANEOUS
  Filled 2020-01-22: qty 1.7

## 2020-01-22 NOTE — Assessment & Plan Note (Signed)
1.  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 -Enzalutamide started on 02/09/2019. -She receives monthly Lupron injections at Dr. Carrolyn Meiers office. -His last PSA reviewed by me on 12/22/2019 was 0.06. -We reviewed his CBC which is grossly within normal limits.  LFTs are normal. -He will continue enzalutamide as long as he gets response.  2.  Low back pain: -Prostate biopsy on 10/20/2019 consistent with squamous cell carcinoma of the prostate. -XRT to the prostate from 10/30/2019-11/13/2019, 30 Gray in 10 fractions. -Pain has improved since then. -He is taking tramadol with Tylenol every 8 hours as needed.  3.  Bone metastasis: -He will continue monthly denosumab. -He is taking calcium 600 mg daily.  He is ambulating with cane/walker as needed.  Creatinine is 0.58.  Calcium is normal.  4.  Suprapubic catheter: -This was changed on 01/12/2020.  No hematuria reported.

## 2020-01-22 NOTE — Patient Instructions (Signed)
[facility] at [facility] Discharge Instructions Received Xgeva injection today. Follow-up as scheduled. Call clinic for any questions or concerns   Thank you for choosing [facility] at [facility] to provide your oncology and hematology care.  To afford each patient quality time with our provider, please arrive at least 15 minutes before your scheduled appointment time.   If you have a lab appointment with the Cancer Center please come in thru the Main Entrance and check in at the main information desk.  You need to re-schedule your appointment should you arrive 10 or more minutes late.  We strive to give you quality time with our providers, and arriving late affects you and other patients whose appointments are after yours.  Also, if you no show three or more times for appointments you may be dismissed from the clinic at the providers discretion.     Again, thank you for choosing [facility].  Our hope is that these requests will decrease the amount of time that you wait before being seen by our physicians.       _____________________________________________________________  Should you have questions after your visit to [facility], please contact our office at [phone] between the hours of 8:00 a.m. and 4:30 p.m.  Voicemails left after 4:00 p.m. will not be returned until the following business day.  For prescription refill requests, have your pharmacy contact our office and allow 72 hours.    Due to Covid, you will need to wear a mask upon entering the hospital. If you do not have a mask, a mask will be given to you at the Main Entrance upon arrival. For doctor visits, patients may have 1 support person with them. For treatment visits, patients can not have anyone with them due to social distancing guidelines and our immunocompromised population.     

## 2020-01-22 NOTE — Progress Notes (Signed)
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 VUS in [facility] called c.3330G>C and VUS in [facility] called c.4369A>T identified on the [facility] CustomNext+RNAinsight panel. The report date is 10/23/2019.  The CustomNext-Cancer + RNAinsight panel  includes sequencing and/or deletion duplication testing of the following 52 genes: AIP, ALK, APC*, ATM*, AXIN2, BARD1, BMPR1A, BRCA1*, BRCA2*, BRIP1*, CDH1*, CDK4, CDKN1B, CDKN2A, CHEK2*, DICER1, LZTR1, MEN1, MLH1*, MSH2*, MSH3, MSH6*, MUTYH*, NBN, NF1*, NF2, NTHL1, PALB2*, PHOX2B, PMS2*, POT1, PRKAR1A, PTCH1, PTEN*, RAD51C*, RAD51D*, RECQL, SMAD4, SMARCA4, SMARCB1, SMARCE1, STK11, SUFU, TP53*, TSC1, TSC2 and VHL (sequencing and deletion/duplication); HOXB13, POLD1 and POLE (sequencing only); EPCAM and GREM1 (deletion/duplication only). DNA and RNA analyses performed for * genes.      CANCER STAGING: Cancer Staging No matching staging information was found for the patient.   INTERVAL HISTORY:  Elijah Santana 86 y.o. male seen for follow-up of metastatic prostate cancer to the bones and lymph nodes.  He is tolerating enzalutamide reasonably well.  Appetite is 75%.  Energy levels are 50%.  Lower back pain is controlled with tramadol and Tylenol 2-3 times a day.  Has trouble swallowing large pills but still manages to get enzalutamide down.  Numbness in the hands and feet has been stable.   REVIEW OF SYSTEMS:  Review of Systems  HENT:   Positive for trouble swallowing.   Musculoskeletal: Positive for back pain.    Neurological: Positive for dizziness and numbness.  Psychiatric/Behavioral: Positive for sleep disturbance.  All other systems reviewed and are negative.    PAST MEDICAL/SURGICAL HISTORY:  Past Medical History:  Diagnosis Date  . Arthritis   . Asthma    Childhood  . Chronic back pain   . Coronary atherosclerosis of native coronary artery    a. CABG 2004 with LIMA-LAD, SVG-D1, SVG-OM, SVG-PDA. b. Cath ~2010 with occ of SVG-diagonal, distal LAD and OM filiing by collaterals. c. NSTEMI 12/2014 s/p DES to SVG-RCA. d. 11/2015: DES to distal graft of the SVG-distal RCA  . Essential hypertension   . Family history of brain cancer   . Family history of pancreatic cancer   . Foley catheter in place   . GERD (gastroesophageal reflux disease)   . Hard of hearing   . History of pneumonia   . Hyperglycemia   . Ischemic cardiomyopathy    a. EF 45% by cath 12/2014.  . Mixed hyperlipidemia   . NSTEMI (non-ST elevated myocardial infarction) ([place]) 01/01/15  . Paroxysmal atrial fibrillation ([facility])   . Prostate cancer [facility])    Radiation therapy 1996  . Skin cancer   . Stroke [facility])    TIA- 28 years ago    Past Surgical History:  Procedure Laterality Date  . CARDIAC CATHETERIZATION  01/01/2015   Procedure: CORONARY STENT INTERVENTION;  Surgeon: Peter M Martinique, MD;  Location: [facility] CATH LAB;  Service: Cardiovascular;;  SVG to PDA  . CARDIAC CATHETERIZATION N/A 11/21/2015   Procedure: Left Heart Cath and Cors/Grafts Angiography;  Surgeon: Troy Sine, MD;  Location: [facility] CV LAB;  Service: Cardiovascular;  Laterality: N/A;  . CARDIAC CATHETERIZATION N/A 11/21/2015   Procedure: Coronary Stent Intervention;  Surgeon: Troy Sine, MD;  Location: [facility] CV LAB;  Service: Cardiovascular;  Laterality: N/A;  . CARDIAC CATHETERIZATION N/A 09/11/2016   Procedure: Left Heart Cath and Cors/Grafts Angiography;  Surgeon: Leonie Man, MD;  Location: [facility] CV LAB;  Service: Cardiovascular;   Laterality: N/A;  . CARDIAC CATHETERIZATION N/A 09/11/2016   Procedure: Coronary Stent Intervention;  Surgeon: Leonie Man, MD;  Location: [facility] CV LAB;  Service: Cardiovascular;  Laterality: N/A;  . CORONARY ARTERY BYPASS GRAFT  2004   LIMA to LAD, SVG to diagonal, SVG to circumflex, SVG to PDA  . CYSTOSCOPY WITH URETHRAL DILATATION N/A 03/28/2019   Procedure: CYSTOSCOPY WITH URETHRAL  DILATATION OF STRICTURE;  Surgeon: Irine Seal, MD;  Location: [facility] ORS;  Service: Urology;  Laterality: N/A;  . GREEN LIGHT LASER TURP (TRANSURETHRAL RESECTION OF PROSTATE N/A 06/04/2016   Procedure: GREEN LIGHT LASER TURP (TRANSURETHRAL RESECTION OF PROSTATE;  Surgeon: Irine Seal, MD;  Location: [facility] ORS;  Service: Urology;  Laterality: N/A;  . LEFT HEART CATHETERIZATION WITH CORONARY/GRAFT ANGIOGRAM N/A 01/01/2015   Procedure: LEFT HEART CATHETERIZATION WITH Beatrix Fetters;  Surgeon: Peter M Martinique, MD;  Location: [facility] CATH LAB;  Service: Cardiovascular;  Laterality: N/A;  . PERCUTANEOUS CORONARY STENT INTERVENTION (PCI-S)  11/20/2015   distal SVG  with DES       . TONSILLECTOMY       SOCIAL HISTORY:  Social History   Socioeconomic History  . Marital status: Widowed    Spouse name: Not on file  . Number of children: 2  . Years of education: Not on file  . Highest education level: Not on file  Occupational History  . Occupation: Retired    Comment: Office manager  Tobacco Use  . Smoking status: Former Smoker    Packs/day: 0.50    Years: 27.00    Pack years: 13.50    Types: Cigars    Start date: 07/28/1964    Quit date: 07/28/1986    Years since quitting: 33.5  . Smokeless tobacco: Former Systems developer    Types: Chew    Quit date: 08/07/2010  . Tobacco comment: never chewed up over a pack/day  Substance and Sexual Activity  . Alcohol use: No    Alcohol/week: 0.0 standard drinks  . Drug use: No  . Sexual activity: Not Currently  Other Topics Concern  . Not on file  Social  History Narrative  . Not on file   Social Determinants of Health   Financial Resource Strain:   . Difficulty of Paying Living Expenses: Not on file  Food Insecurity:   . Worried About Charity fundraiser in the Last Year: Not on file  . Ran Out of Food in the Last Year: Not on file  Transportation Needs:   . Lack of Transportation (Medical): Not on file  . Lack of Transportation (Non-Medical): Not on file  Physical Activity:   . Days of Exercise per Week: Not on file  . Minutes of Exercise per Session: Not on file  Stress:   . Feeling of Stress : Not on file  Social Connections:   . Frequency of Communication with Friends and Family: Not on file  . Frequency of Social Gatherings with Friends and Family: Not on file  . Attends Religious Services: Not on file  . Active Member of Clubs or Organizations: Not on file  . Attends Club  or Organization Meetings: Not on file  . Marital Status: Not on file  Intimate Partner Violence:   . Fear of Current or Ex-Partner: Not on file  . Emotionally Abused: Not on file  . Physically Abused: Not on file  . Sexually Abused: Not on file    FAMILY HISTORY:  Family History  Problem Relation Age of Onset  . Hypertension Father   . Coronary artery disease Father   . Pancreatic cancer Mother 29  . Brain cancer Sister 29  . Brain cancer Nephew        dx age 12, d. 27s    CURRENT MEDICATIONS:  Outpatient Encounter Medications as of 01/22/2020  Medication Sig Note  . amLODipine (NORVASC) 5 MG tablet TAKE ONE TABLET BY MOUTH DAILY (MORNING) (Patient taking differently: Take 5 mg by mouth every morning. )   . calcium carbonate (OS-CAL - DOSED IN MG OF ELEMENTAL CALCIUM) 1250 (500 Ca) MG tablet Take 1 tablet by mouth daily.    . clopidogrel (PLAVIX) 75 MG tablet TAKE ONE TABLET BY MOUTH EVERY DAY (,EVENING)   . denosumab (XGEVA) 120 MG/1.7ML SOLN injection Inject 120 mg into the skin once.    Marland Kitchen ELIQUIS 5 MG TABS tablet TAKE ONE TABLET BY MOUTH TWICE  DAILY. (MORNING ,EVENING)   . isosorbide mononitrate (IMDUR) 60 MG 24 hr tablet TAKE ONE TABLET BY MOUTH DAILY (MORNING) (Patient taking differently: Take 60 mg by mouth every morning. )   . Leuprolide Acetate (LUPRON IJ) Inject as directed every 4 (four) months.  03/26/2019: Administered by the office of Dr. Jeffie Pollock every 4 months   . linaclotide (LINZESS) 290 MCG CAPS capsule Take 1 capsule (290 mcg total) by mouth daily before breakfast.   . metoprolol tartrate (LOPRESSOR) 50 MG tablet Take 1 tablet (50 mg total) by mouth 2 (two) times daily.   . pantoprazole (PROTONIX) 40 MG tablet Take 40 mg by mouth every morning.   . rosuvastatin (CRESTOR) 20 MG tablet Take 1 tablet (20 mg total) by mouth every evening.   Marland Kitchen STOOL SOFTENER 100 MG capsule Take 100-200 mg by mouth daily as needed for mild constipation.    . TOVIAZ 4 MG TB24 tablet Take 4 mg by mouth daily.   . traMADol (ULTRAM) 50 MG tablet Take 0.5 tablets (25 mg total) by mouth daily as needed.   Gillermina Phy 40 MG capsule TAKE 4 CAPSULES (160 MG TOTAL) BY MOUTH DAILY.   Marland Kitchen acetaminophen (TYLENOL) 325 MG tablet Take 2 tablets (650 mg total) by mouth every 4 (four) hours as needed for headache or mild pain. (Patient not taking: Reported on 01/22/2020)   . nitroGLYCERIN (NITROSTAT) 0.4 MG SL tablet DISSOLVE ONE TABLET UNDER TONGUE EVERY 5 MINUTES UP TO 3 DOSES AS NEEDED FOR CHEST PAIN (Patient not taking: Reported on 01/22/2020)    No facility-administered encounter medications on file as of 01/22/2020.    ALLERGIES:  No Known Allergies   PHYSICAL EXAM:  ECOG Performance status: 1  Vitals:   01/22/20 1043  BP: 134/68  Pulse: 67  Resp: 18  Temp: (!) 96.9 F (36.1 C)  SpO2: 97%   Filed Weights   01/22/20 1043  Weight: 172 lb 3.2 oz (78.1 kg)    Physical Exam Vitals reviewed.  Constitutional:      Appearance: Normal appearance.  Cardiovascular:     Rate and Rhythm: Normal rate and regular rhythm.     Heart sounds: Normal heart sounds.    Pulmonary:  Effort: Pulmonary effort is normal.     Breath sounds: Normal breath sounds.  Abdominal:     General: There is no distension.     Palpations: Abdomen is soft. There is no mass.  Musculoskeletal:        General: No swelling.  Skin:    General: Skin is warm.  Neurological:     General: No focal deficit present.     Mental Status: He is alert and oriented to person, place, and time.  Psychiatric:        Mood and Affect: Mood normal.        Behavior: Behavior normal.      LABORATORY DATA:  I have reviewed the labs as listed.  CBC    Component Value Date/Time   WBC 6.2 01/22/2020 1001   RBC 4.36 01/22/2020 1001   HGB 11.3 (L) 01/22/2020 1001   HCT 38.5 (L) 01/22/2020 1001   PLT 303 01/22/2020 1001   MCV 88.3 01/22/2020 1001   MCH 25.9 (L) 01/22/2020 1001   MCHC 29.4 (L) 01/22/2020 1001   RDW 16.1 (H) 01/22/2020 1001   LYMPHSABS 1.1 01/22/2020 1001   MONOABS 0.4 01/22/2020 1001   EOSABS 0.2 01/22/2020 1001   BASOSABS 0.0 01/22/2020 1001   CMP Latest Ref Rng & Units 01/22/2020 12/22/2019 11/23/2019  Glucose 70 - 99 mg/dL 108(H) 102(H) 123(H)  BUN 8 - 23 mg/dL '17 17 13  '$ Creatinine 0.61 - 1.24 mg/dL 0.58(L) 0.66 0.61  Sodium 135 - 145 mmol/L 140 142 137  Potassium 3.5 - 5.1 mmol/L 3.6 4.3 3.9  Chloride 98 - 111 mmol/L 104 108 105  CO2 22 - 32 mmol/L '27 26 24  '$ Calcium 8.9 - 10.3 mg/dL 8.8(L) 8.8(L) 8.3(L)  Total Protein 6.5 - 8.1 g/dL 6.9 6.7 6.4(L)  Total Bilirubin 0.3 - 1.2 mg/dL 0.6 0.9 0.5  Alkaline Phos 38 - 126 U/L 47 38 46  AST 15 - 41 U/L 11(L) 18 10(L)  ALT 0 - 44 U/L '9 17 9       '$ DIAGNOSTIC IMAGING:  I have independently reviewed the scans.   I have reviewed Venita Lick LPN's note and agree with the documentation.  I personally performed a face-to-face visit, made revisions and my assessment and plan is as follows.    ASSESSMENT & PLAN:   Prostate cancer ([place]) 1.  Metastatic CRPC to the lymph nodes and bones: -PD-L1 TPS-30%,  foundation 1-MS-stable, no targetable alterations. -Enzalutamide started on 02/09/2019. -She receives monthly Lupron injections at Dr. Carrolyn Meiers office. -His last PSA reviewed by me on 12/22/2019 was 0.06. -We reviewed his CBC which is grossly within normal limits.  LFTs are normal. -He will continue enzalutamide as long as he gets response.  2.  Low back pain: -Prostate biopsy on 10/20/2019 consistent with squamous cell carcinoma of the prostate. -XRT to the prostate from 10/30/2019-11/13/2019, 30 Gray in 10 fractions. -Pain has improved since then. -He is taking tramadol with Tylenol every 8 hours as needed.  3.  Bone metastasis: -He will continue monthly denosumab. -He is taking calcium 600 mg daily.  He is ambulating with cane/walker as needed.  Creatinine is 0.58.  Calcium is normal.  4.  Suprapubic catheter: -This was changed on 01/12/2020.  No hematuria reported.      Orders placed this encounter:  Orders Placed This Encounter  Procedures  . CBC with Differential/Platelet  . Comprehensive metabolic panel  . Lactate dehydrogenase  . PSA      Derek Jack,  MD [facility] [phone]    

## 2020-01-22 NOTE — Patient Instructions (Addendum)
[place] at [facility] Discharge Instructions  You were seen today by [name]. He went over your recent results. Continue your monthly injections for your bones. Continue taking your medication as directed with no changes. He will see you back in 2 months for labs and follow up.   Thank you for choosing [place] at [facility] to provide your oncology and hematology care.  To afford each patient quality time with our provider, please arrive at least 15 minutes before your scheduled appointment time.   If you have a lab appointment with the [facility] please come in thru the  Main Entrance and check in at the main information desk  You need to re-schedule your appointment should you arrive 10 or more minutes late.  We strive to give you quality time with our providers, and arriving late affects you and other patients whose appointments are after yours.  Also, if you no show three or more times for appointments you may be dismissed from the clinic at the providers discretion.     Again, thank you for choosing [facility].  Our hope is that these requests will decrease the amount of time that you wait before being seen by our physicians.       _____________________________________________________________  Should you have questions after your visit to [facility], please contact our office at [phone] between the hours of 8:00 a.m. and 4:30 p.m.  Voicemails left after 4:00 p.m. will not be returned until the following business day.  For prescription refill requests, have your pharmacy contact our office and allow 72 hours.    Cancer Center Support Programs:   > Cancer Support Group  2nd Tuesday of the month 1pm-2pm, Journey Room

## 2020-01-22 NOTE — Progress Notes (Signed)
1120 Labs reviewed with and pt seen by [name] and pt approved for Xgeva injection today per MD                                     Vaughan Basta tolerated Xgeva injection well without complaints or incident. Calcium 8.8 today and pt denied any tooth or jaw pain and no recent or future dental visits prior to administering this medication. Pt continues to take his Calcium PO as prescribed without issues. Pt discharged via wheelchair in satisfactory condition accompanied by family member

## 2020-02-05 ENCOUNTER — Other Ambulatory Visit: Payer: Self-pay | Admitting: Cardiology

## 2020-02-06 ENCOUNTER — Ambulatory Visit (INDEPENDENT_AMBULATORY_CARE_PROVIDER_SITE_OTHER): Payer: Medicare HMO

## 2020-02-06 ENCOUNTER — Other Ambulatory Visit: Payer: Self-pay

## 2020-02-06 VITALS — Temp 96.8°F

## 2020-02-06 DIAGNOSIS — G4700 Insomnia, unspecified: Secondary | ICD-10-CM | POA: Diagnosis not present

## 2020-02-06 DIAGNOSIS — R339 Retention of urine, unspecified: Secondary | ICD-10-CM | POA: Diagnosis not present

## 2020-02-06 DIAGNOSIS — I10 Essential (primary) hypertension: Secondary | ICD-10-CM | POA: Diagnosis not present

## 2020-02-06 DIAGNOSIS — Z299 Encounter for prophylactic measures, unspecified: Secondary | ICD-10-CM | POA: Diagnosis not present

## 2020-02-06 DIAGNOSIS — M549 Dorsalgia, unspecified: Secondary | ICD-10-CM | POA: Diagnosis not present

## 2020-02-06 DIAGNOSIS — C7951 Secondary malignant neoplasm of bone: Secondary | ICD-10-CM | POA: Diagnosis not present

## 2020-02-06 DIAGNOSIS — C61 Malignant neoplasm of prostate: Secondary | ICD-10-CM | POA: Diagnosis not present

## 2020-02-06 NOTE — Progress Notes (Signed)
Cath Change/ Replacement  Patient is present today for a catheter change due to urinary retention.  61ml of water was removed from the balloon, a 16FR foley cath was removed with out difficulty.  Patient was cleaned and prepped in a sterile fashion with betadine. A 16 FR foley cath was replaced into the bladder no complications were noted Urine return was noted 82ml and urine was yellow in color. The balloon was filled with 48ml of sterile water. A leg bag was attached for drainage. Patient was given proper instruction on catheter care.    Performed by: d,Haile Toppins, LPN  Follow up: [place]

## 2020-02-08 MED FILL — XTANDI 40 MG CAPSULE: 40 | 30 days supply | Qty: 120 | Fill #2

## 2020-02-15 ENCOUNTER — Other Ambulatory Visit (HOSPITAL_COMMUNITY): Payer: Self-pay | Admitting: *Deleted

## 2020-02-15 ENCOUNTER — Encounter (HOSPITAL_COMMUNITY): Payer: Self-pay | Admitting: *Deleted

## 2020-02-15 DIAGNOSIS — C61 Malignant neoplasm of prostate: Secondary | ICD-10-CM

## 2020-02-15 DIAGNOSIS — C7951 Secondary malignant neoplasm of bone: Secondary | ICD-10-CM

## 2020-02-15 NOTE — Progress Notes (Signed)
Patient's daughter called stating that her dad is complaining of pain in his rectum and lower back.  This had resolved after radiation but has now came back.  Patient is taking Tramadol for pain along with tylenol.  I spoke with [name] and he wants patient to have CT scan.  I have called back and spoke with his daughter and advised of the scan.  I told her we would call next week with her appt date and time.  She verbalized understanding.

## 2020-02-19 ENCOUNTER — Inpatient Hospital Stay (HOSPITAL_COMMUNITY): Payer: Medicare HMO | Attending: Hematology

## 2020-02-19 ENCOUNTER — Inpatient Hospital Stay (HOSPITAL_COMMUNITY): Payer: Medicare HMO

## 2020-02-19 ENCOUNTER — Other Ambulatory Visit: Payer: Self-pay

## 2020-02-19 ENCOUNTER — Encounter (HOSPITAL_COMMUNITY): Payer: Self-pay

## 2020-02-19 VITALS — BP 120/70 | HR 62 | Temp 97.3°F | Resp 18

## 2020-02-19 DIAGNOSIS — I252 Old myocardial infarction: Secondary | ICD-10-CM | POA: Insufficient documentation

## 2020-02-19 DIAGNOSIS — Z7901 Long term (current) use of anticoagulants: Secondary | ICD-10-CM | POA: Diagnosis not present

## 2020-02-19 DIAGNOSIS — Z8673 Personal history of transient ischemic attack (TIA), and cerebral infarction without residual deficits: Secondary | ICD-10-CM | POA: Diagnosis not present

## 2020-02-19 DIAGNOSIS — M545 Low back pain: Secondary | ICD-10-CM | POA: Insufficient documentation

## 2020-02-19 DIAGNOSIS — R200 Anesthesia of skin: Secondary | ICD-10-CM | POA: Insufficient documentation

## 2020-02-19 DIAGNOSIS — C61 Malignant neoplasm of prostate: Secondary | ICD-10-CM | POA: Insufficient documentation

## 2020-02-19 DIAGNOSIS — K5909 Other constipation: Secondary | ICD-10-CM | POA: Diagnosis not present

## 2020-02-19 DIAGNOSIS — E782 Mixed hyperlipidemia: Secondary | ICD-10-CM | POA: Diagnosis not present

## 2020-02-19 DIAGNOSIS — Z596 Low income: Secondary | ICD-10-CM | POA: Diagnosis not present

## 2020-02-19 DIAGNOSIS — Z800 Family history of malignant neoplasm of digestive organs: Secondary | ICD-10-CM | POA: Diagnosis not present

## 2020-02-19 DIAGNOSIS — Z87891 Personal history of nicotine dependence: Secondary | ICD-10-CM | POA: Insufficient documentation

## 2020-02-19 DIAGNOSIS — Z79899 Other long term (current) drug therapy: Secondary | ICD-10-CM | POA: Diagnosis not present

## 2020-02-19 DIAGNOSIS — I480 Paroxysmal atrial fibrillation: Secondary | ICD-10-CM | POA: Diagnosis not present

## 2020-02-19 DIAGNOSIS — C7951 Secondary malignant neoplasm of bone: Secondary | ICD-10-CM

## 2020-02-19 DIAGNOSIS — G479 Sleep disorder, unspecified: Secondary | ICD-10-CM | POA: Insufficient documentation

## 2020-02-19 DIAGNOSIS — Z85828 Personal history of other malignant neoplasm of skin: Secondary | ICD-10-CM | POA: Diagnosis not present

## 2020-02-19 DIAGNOSIS — Z808 Family history of malignant neoplasm of other organs or systems: Secondary | ICD-10-CM | POA: Diagnosis not present

## 2020-02-19 DIAGNOSIS — M549 Dorsalgia, unspecified: Secondary | ICD-10-CM | POA: Diagnosis not present

## 2020-02-19 DIAGNOSIS — M1990 Unspecified osteoarthritis, unspecified site: Secondary | ICD-10-CM | POA: Diagnosis not present

## 2020-02-19 DIAGNOSIS — I10 Essential (primary) hypertension: Secondary | ICD-10-CM | POA: Diagnosis not present

## 2020-02-19 DIAGNOSIS — Z9079 Acquired absence of other genital organ(s): Secondary | ICD-10-CM | POA: Insufficient documentation

## 2020-02-19 DIAGNOSIS — Z8249 Family history of ischemic heart disease and other diseases of the circulatory system: Secondary | ICD-10-CM | POA: Insufficient documentation

## 2020-02-19 LAB — COMPREHENSIVE METABOLIC PANEL
ALT: 7 U/L (ref 0–44)
AST: 9 U/L — ABNORMAL LOW (ref 15–41)
Albumin: 3.1 g/dL — ABNORMAL LOW (ref 3.5–5.0)
Alkaline Phosphatase: 42 U/L (ref 38–126)
Anion gap: 8 (ref 5–15)
BUN: 17 mg/dL (ref 8–23)
CO2: 26 mmol/L (ref 22–32)
Calcium: 8.6 mg/dL — ABNORMAL LOW (ref 8.9–10.3)
Chloride: 103 mmol/L (ref 98–111)
Creatinine, Ser: 0.63 mg/dL (ref 0.61–1.24)
GFR calc Af Amer: >60 mL/min (ref >60)
GFR calc non Af Amer: >60 mL/min (ref >60)
Glucose, Bld: 107 mg/dL — ABNORMAL HIGH (ref 70–99)
Potassium: 4.1 mmol/L (ref 3.5–5.1)
Sodium: 137 mmol/L (ref 135–145)
Total Bilirubin: 0.5 mg/dL (ref 0.3–1.2)
Total Protein: 6.4 g/dL — ABNORMAL LOW (ref 6.5–8.1)

## 2020-02-19 MED ORDER — DENOSUMAB 120 MG/1.7ML ~~LOC~~ SOLN
SUBCUTANEOUS | Status: AC
  Filled 2020-02-19: qty 1.7

## 2020-02-19 MED ORDER — DENOSUMAB 120 MG/1.7ML ~~LOC~~ SOLN
120.0000 mg | Freq: Once | SUBCUTANEOUS | Status: AC
  Administered 2020-02-19: 13:00:00 120 mg via SUBCUTANEOUS

## 2020-02-19 NOTE — Progress Notes (Signed)
Corrected Ca++ 9.3.  Pt denies jaw pain or any upcoming dental procedures.  Will proceed with Xgeva administration.

## 2020-02-22 ENCOUNTER — Ambulatory Visit (HOSPITAL_COMMUNITY)
Admission: RE | Admit: 2020-02-22 | Discharge: 2020-02-22 | Disposition: A | Discharge status: Home / Self Care | Payer: Medicare HMO | Source: Ambulatory Visit | Attending: Hematology | Admitting: Hematology

## 2020-02-22 ENCOUNTER — Other Ambulatory Visit: Payer: Self-pay

## 2020-02-22 ENCOUNTER — Encounter (HOSPITAL_COMMUNITY): Payer: Self-pay

## 2020-02-22 DIAGNOSIS — C61 Malignant neoplasm of prostate: Secondary | ICD-10-CM | POA: Diagnosis not present

## 2020-02-22 DIAGNOSIS — C7951 Secondary malignant neoplasm of bone: Secondary | ICD-10-CM | POA: Insufficient documentation

## 2020-02-22 DIAGNOSIS — R109 Unspecified abdominal pain: Secondary | ICD-10-CM | POA: Diagnosis not present

## 2020-02-22 MED ORDER — SODIUM CHLORIDE (PF) 0.9 % IJ SOLN
INTRAMUSCULAR | Status: AC
  Filled 2020-02-22: qty 50

## 2020-02-22 MED ORDER — IOHEXOL 300 MG/ML  SOLN
100.0000 mL | Freq: Once | INTRAMUSCULAR | Status: AC | PRN
  Administered 2020-02-22: 12:00:00 100 mL via INTRAVENOUS

## 2020-02-23 NOTE — Telephone Encounter (Signed)
-----   Message from Irine Seal, MD sent at 02/21/2020  7:04 PM EDT ----- Regarding: RE: They could try keeping a dressing on it to pad it. ----- Message ----- From: Valentina Lucks, LPN Sent: [id number]   4:38 PM EDT To: Irine Seal, MD  This pt's care giver said the pt. C/o the cath site hurts him all the time. I stated to them it looks like his pants belt may be rubbing the site and she agreed. Also she said that he "pull on it and fools with it a lot" also. I spoke with Estill Bamberg and she said pt. Says this normally when he comes it. I just wanted to see what you thought.

## 2020-02-26 ENCOUNTER — Other Ambulatory Visit: Payer: Self-pay

## 2020-02-26 ENCOUNTER — Encounter (HOSPITAL_COMMUNITY): Payer: Self-pay | Admitting: Hematology

## 2020-02-26 ENCOUNTER — Inpatient Hospital Stay (HOSPITAL_BASED_OUTPATIENT_CLINIC_OR_DEPARTMENT_OTHER): Payer: Medicare HMO | Admitting: Hematology

## 2020-02-26 VITALS — BP 119/79 | HR 71 | Temp 96.9°F | Resp 18 | Wt 173.2 lb

## 2020-02-26 DIAGNOSIS — K5909 Other constipation: Secondary | ICD-10-CM | POA: Diagnosis not present

## 2020-02-26 DIAGNOSIS — I480 Paroxysmal atrial fibrillation: Secondary | ICD-10-CM | POA: Diagnosis not present

## 2020-02-26 DIAGNOSIS — C7951 Secondary malignant neoplasm of bone: Secondary | ICD-10-CM | POA: Diagnosis not present

## 2020-02-26 DIAGNOSIS — I10 Essential (primary) hypertension: Secondary | ICD-10-CM | POA: Diagnosis not present

## 2020-02-26 DIAGNOSIS — M545 Low back pain: Secondary | ICD-10-CM | POA: Diagnosis not present

## 2020-02-26 DIAGNOSIS — G479 Sleep disorder, unspecified: Secondary | ICD-10-CM | POA: Diagnosis not present

## 2020-02-26 DIAGNOSIS — C61 Malignant neoplasm of prostate: Secondary | ICD-10-CM

## 2020-02-26 DIAGNOSIS — M549 Dorsalgia, unspecified: Secondary | ICD-10-CM | POA: Diagnosis not present

## 2020-02-26 DIAGNOSIS — R200 Anesthesia of skin: Secondary | ICD-10-CM | POA: Diagnosis not present

## 2020-02-26 MED ORDER — HYDROCODONE-ACETAMINOPHEN 5-325 MG PO TABS: 1.0000 | ORAL_TABLET | Freq: Every evening | ORAL | 0 refills | Status: DC | PRN

## 2020-02-26 NOTE — Assessment & Plan Note (Signed)
1.  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 -Enzalutamide started on 02/09/2019. -He receives Lupron injections with [name].  He will have suprapubic catheter changed once a month. -Last PSA on 01/22/2020 was 0.05.  He is continuing to tolerate enzalutamide very well. -We reviewed CTAP from 02/22/2020 which shows prostatic mass stable at 5.7 x 4.7 cm.  However appears to increasingly directly invade the anterior mid to lower rectal wall.  Bone metastasis in the spine and pelvic girdle are stable with no new meta stasis.  No adenopathy. -His labs on 01/22/2020 and 02/19/2020 reviewed by me were grossly normal. -I will reach out to Drs. Tammi Klippel ans Roni Bread anything more can be offered. -I will reevaluate him in 4 weeks.  2.  Low back pain: -Prostate biopsy on 10/20/2019 consistent with squamous cell carcinoma the prostate. -XRT to the prostate from 10/30/2019 through 11/13/2019, 30 Gray in 10 fractions with improvement in pain. -He developed rectal pain again in the last couple of weeks.  We have done a CT scan which shows prostate mass directly invading the lower rectal wall. -He is currently taking tramadol and Tylenol every 6 hours.  I will give him prescription for hydrocodone 5/325 to be taken at bedtime as needed.  3.  Bone metastasis: -He will continue monthly denosumab. -He will continue taking calcium 600 mg daily.  Calcium is 8.6 and creatinine normal.  4.  Suprapubic catheter: -He does not report any hematuria.  He is having the catheter changed with [name].

## 2020-02-26 NOTE — Progress Notes (Signed)
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   BRIEF ONCOLOGIC HISTORY:  Oncology History  Prostate cancer ([place])  11/27/2017 Initial Diagnosis   Prostate cancer ([place])    Genetic Testing   Negative genetic testing: No pathogenic variants identified. VUS in [facility] called c.3330G>C and VUS in [facility] called c.4369A>T identified on the [facility] CustomNext+RNAinsight panel. The report date is 10/23/2019.  The CustomNext-Cancer + RNAinsight panel  includes sequencing and/or deletion duplication testing of the following 52 genes: AIP, ALK, APC*, ATM*, AXIN2, BARD1, BMPR1A, BRCA1*, BRCA2*, BRIP1*, CDH1*, CDK4, CDKN1B, CDKN2A, CHEK2*, DICER1, LZTR1, MEN1, MLH1*, MSH2*, MSH3, MSH6*, MUTYH*, NBN, NF1*, NF2, NTHL1, PALB2*, PHOX2B, PMS2*, POT1, PRKAR1A, PTCH1, PTEN*, RAD51C*, RAD51D*, RECQL, SMAD4, SMARCA4, SMARCB1, SMARCE1, STK11, SUFU, TP53*, TSC1, TSC2 and VHL (sequencing and deletion/duplication); HOXB13, POLD1 and POLE (sequencing only); EPCAM and GREM1 (deletion/duplication only). DNA and RNA analyses performed for * genes.      CANCER STAGING: Cancer Staging No matching staging information was found for the patient.   INTERVAL HISTORY:  Elijah Santana 86 y.o. male seen for follow-up of metastatic prostate cancer to the bones and lymph nodes.  He is taking enzalutamide very well without any side effects.  However developed rectal pain again along with suprapubic pain in the last 2 to 3 weeks.  He is accompanied today by his daughter today.  Appetite is 50%.  Energy levels are 75%.  Has chronic constipation which is unchanged.  He has mild trouble swallowing pills which is also unchanged.  He is currently taking Tylenol and tramadol every 6 hours for this pain which is helping control it.   REVIEW OF  SYSTEMS:  Review of Systems  HENT:   Positive for trouble swallowing.   Gastrointestinal: Positive for constipation.  Musculoskeletal: Positive for back pain.  Neurological: Positive for numbness.  Psychiatric/Behavioral: Positive for sleep disturbance.  All other systems reviewed and are negative.    PAST MEDICAL/SURGICAL HISTORY:  Past Medical History:  Diagnosis Date  . Arthritis   . Asthma    Childhood  . Chronic back pain   . Coronary atherosclerosis of native coronary artery    a. CABG 2004 with LIMA-LAD, SVG-D1, SVG-OM, SVG-PDA. b. Cath ~2010 with occ of SVG-diagonal, distal LAD and OM filiing by collaterals. c. NSTEMI 12/2014 s/p DES to SVG-RCA. d. 11/2015: DES to distal graft of the SVG-distal RCA  . Essential hypertension   . Family history of brain cancer   . Family history of pancreatic cancer   . Foley catheter in place   . GERD (gastroesophageal reflux disease)   . Hard of hearing   . History of pneumonia   . Hyperglycemia   . Ischemic cardiomyopathy    a. EF 45% by cath 12/2014.  . Mixed hyperlipidemia   . NSTEMI (non-ST elevated myocardial infarction) ([place]) 01/01/15  . Paroxysmal atrial fibrillation ([facility])   . Prostate cancer [facility])    Radiation therapy 1996  . Skin cancer   . Stroke [facility])    TIA- 28 years ago    Past Surgical History:  Procedure Laterality Date  . CARDIAC CATHETERIZATION  01/01/2015   Procedure: CORONARY STENT INTERVENTION;  Surgeon: Peter M Martinique, MD;  Location: [facility] CATH LAB;  Service: Cardiovascular;;  SVG to PDA  . CARDIAC  CATHETERIZATION N/A 11/21/2015   Procedure: Left Heart Cath and Cors/Grafts Angiography;  Surgeon: Troy Sine, MD;  Location: [place] CV LAB;  Service: Cardiovascular;  Laterality: N/A;  . CARDIAC CATHETERIZATION N/A 11/21/2015   Procedure: Coronary Stent Intervention;  Surgeon: Troy Sine, MD;  Location: [place] CV LAB;  Service: Cardiovascular;  Laterality: N/A;  . CARDIAC CATHETERIZATION N/A 09/11/2016    Procedure: Left Heart Cath and Cors/Grafts Angiography;  Surgeon: Leonie Man, MD;  Location: [place] CV LAB;  Service: Cardiovascular;  Laterality: N/A;  . CARDIAC CATHETERIZATION N/A 09/11/2016   Procedure: Coronary Stent Intervention;  Surgeon: Leonie Man, MD;  Location: [place] CV LAB;  Service: Cardiovascular;  Laterality: N/A;  . CORONARY ARTERY BYPASS GRAFT  2004   LIMA to LAD, SVG to diagonal, SVG to circumflex, SVG to PDA  . CYSTOSCOPY WITH URETHRAL DILATATION N/A 03/28/2019   Procedure: CYSTOSCOPY WITH URETHRAL  DILATATION OF STRICTURE;  Surgeon: Irine Seal, MD;  Location: [facility] ORS;  Service: Urology;  Laterality: N/A;  . GREEN LIGHT LASER TURP (TRANSURETHRAL RESECTION OF PROSTATE N/A 06/04/2016   Procedure: GREEN LIGHT LASER TURP (TRANSURETHRAL RESECTION OF PROSTATE;  Surgeon: Irine Seal, MD;  Location: [facility] ORS;  Service: Urology;  Laterality: N/A;  . LEFT HEART CATHETERIZATION WITH CORONARY/GRAFT ANGIOGRAM N/A 01/01/2015   Procedure: LEFT HEART CATHETERIZATION WITH Beatrix Fetters;  Surgeon: Peter M Martinique, MD;  Location: [facility] CATH LAB;  Service: Cardiovascular;  Laterality: N/A;  . PERCUTANEOUS CORONARY STENT INTERVENTION (PCI-S)  11/20/2015   distal SVG  with DES       . TONSILLECTOMY       SOCIAL HISTORY:  Social History   Socioeconomic History  . Marital status: Widowed    Spouse name: Not on file  . Number of children: 2  . Years of education: Not on file  . Highest education level: Not on file  Occupational History  . Occupation: Retired    Comment: Office manager  Tobacco Use  . Smoking status: Former Smoker    Packs/day: 0.50    Years: 27.00    Pack years: 13.50    Types: Cigars    Start date: 07/28/1964    Quit date: 07/28/1986    Years since quitting: 33.6  . Smokeless tobacco: Former Systems developer    Types: Chew    Quit date: 08/07/2010  . Tobacco comment: never chewed up over a pack/day  Substance and Sexual Activity  . Alcohol use:  No    Alcohol/week: 0.0 standard drinks  . Drug use: No  . Sexual activity: Not Currently  Other Topics Concern  . Not on file  Social History Narrative  . Not on file   Social Determinants of Health   Financial Resource Strain:   . Difficulty of Paying Living Expenses:   Food Insecurity:   . Worried About Charity fundraiser in the Last Year:   . Arboriculturist in the Last Year:   Transportation Needs:   . Film/video editor (Medical):   Marland Kitchen Lack of Transportation (Non-Medical):   Physical Activity:   . Days of Exercise per Week:   . Minutes of Exercise per Session:   Stress:   . Feeling of Stress :   Social Connections:   . Frequency of Communication with Friends and Family:   . Frequency of Social Gatherings with Friends and Family:   . Attends Religious Services:   . Active Member of Clubs or Organizations:   .  Attends Archivist Meetings:   Marland Kitchen Marital Status:   Intimate Partner Violence:   . Fear of Current or Ex-Partner:   . Emotionally Abused:   Marland Kitchen Physically Abused:   . Sexually Abused:     FAMILY HISTORY:  Family History  Problem Relation Age of Onset  . Hypertension Father   . Coronary artery disease Father   . Pancreatic cancer Mother 45  . Brain cancer Sister 16  . Brain cancer Nephew        dx age 62, d. 62s    CURRENT MEDICATIONS:  Outpatient Encounter Medications as of 02/26/2020  Medication Sig Note  . amLODipine (NORVASC) 5 MG tablet TAKE ONE TABLET BY MOUTH DAILY (MORNING) (Patient taking differently: Take 5 mg by mouth every morning. )   . calcium carbonate (OS-CAL - DOSED IN MG OF ELEMENTAL CALCIUM) 1250 (500 Ca) MG tablet Take 1 tablet by mouth daily.    . clopidogrel (PLAVIX) 75 MG tablet TAKE ONE TABLET BY MOUTH EVERY DAY (,EVENING)   . denosumab (XGEVA) 120 MG/1.7ML SOLN injection Inject 120 mg into the skin once.    Marland Kitchen ELIQUIS 5 MG TABS tablet TAKE ONE TABLET BY MOUTH TWICE DAILY. (MORNING ,EVENING)   . isosorbide mononitrate  (IMDUR) 60 MG 24 hr tablet TAKE ONE TABLET BY MOUTH DAILY (MORNING) (Patient taking differently: Take 60 mg by mouth every morning. )   . Leuprolide Acetate (LUPRON IJ) Inject as directed every 4 (four) months.  03/26/2019: Administered by the office of Dr. Jeffie Pollock every 4 months   . linaclotide (LINZESS) 290 MCG CAPS capsule Take 1 capsule (290 mcg total) by mouth daily before breakfast.   . metoprolol tartrate (LOPRESSOR) 50 MG tablet Take 1 tablet (50 mg total) by mouth 2 (two) times daily.   . pantoprazole (PROTONIX) 40 MG tablet Take 40 mg by mouth every morning.   . rosuvastatin (CRESTOR) 20 MG tablet Take 1 tablet (20 mg total) by mouth every evening.   Marland Kitchen STOOL SOFTENER 100 MG capsule Take 100-200 mg by mouth daily as needed for mild constipation.    . TOVIAZ 4 MG TB24 tablet Take 4 mg by mouth daily.   Gillermina Phy 40 MG capsule TAKE 4 CAPSULES (160 MG TOTAL) BY MOUTH DAILY.   Marland Kitchen acetaminophen (TYLENOL) 325 MG tablet Take 2 tablets (650 mg total) by mouth every 4 (four) hours as needed for headache or mild pain. (Patient not taking: Reported on 01/22/2020)   . HYDROcodone-acetaminophen (NORCO) 5-325 MG tablet Take 1 tablet by mouth at bedtime as needed for moderate pain.   . nitroGLYCERIN (NITROSTAT) 0.4 MG SL tablet DISSOLVE ONE TABLET UNDER TONGUE EVERY 5 MINUTES UP TO 3 DOSES AS NEEDED FOR CHEST PAIN (Patient not taking: Reported on 02/26/2020)   . traMADol (ULTRAM) 50 MG tablet Take 0.5 tablets (25 mg total) by mouth daily as needed. (Patient not taking: Reported on 02/26/2020)    No facility-administered encounter medications on file as of 02/26/2020.    ALLERGIES:  No Known Allergies   PHYSICAL EXAM:  ECOG Performance status: 1  Vitals:   02/26/20 1453  BP: 119/79  Pulse: 71  Resp: 18  Temp: (!) 96.9 F (36.1 C)  SpO2: 98%   Filed Weights   02/26/20 1453  Weight: 173 lb 3.2 oz (78.6 kg)    Physical Exam Vitals reviewed.  Constitutional:      Appearance: Normal appearance.    Cardiovascular:     Rate and Rhythm: Normal rate  and regular rhythm.     Heart sounds: Normal heart sounds.  Pulmonary:     Effort: Pulmonary effort is normal.     Breath sounds: Normal breath sounds.  Abdominal:     General: There is no distension.     Palpations: Abdomen is soft. There is no mass.  Musculoskeletal:        General: No swelling.  Skin:    General: Skin is warm.  Neurological:     General: No focal deficit present.     Mental Status: He is alert and oriented to person, place, and time.  Psychiatric:        Mood and Affect: Mood normal.        Behavior: Behavior normal.      LABORATORY DATA:  I have reviewed the labs as listed.  CBC    Component Value Date/Time   WBC 6.2 01/22/2020 1001   RBC 4.36 01/22/2020 1001   HGB 11.3 (L) 01/22/2020 1001   HCT 38.5 (L) 01/22/2020 1001   PLT 303 01/22/2020 1001   MCV 88.3 01/22/2020 1001   MCH 25.9 (L) 01/22/2020 1001   MCHC 29.4 (L) 01/22/2020 1001   RDW 16.1 (H) 01/22/2020 1001   LYMPHSABS 1.1 01/22/2020 1001   MONOABS 0.4 01/22/2020 1001   EOSABS 0.2 01/22/2020 1001   BASOSABS 0.0 01/22/2020 1001   CMP Latest Ref Rng & Units 02/19/2020 01/22/2020 12/22/2019  Glucose 70 - 99 mg/dL 107(H) 108(H) 102(H)  BUN 8 - 23 mg/dL _0 Creatinine 0.61 - 1.24 mg/dL 0.63 0.58(L) 0.66  Sodium 135 - 145 mmol/L 137 140 142  Potassium 3.5 - 5.1 mmol/L 4.1 3.6 4.3  Chloride 98 - 111 mmol/L 103 104 108  CO2 22 - 32 mmol/L _1 Calcium 8.9 - 10.3 mg/dL 8.6(L) 8.8(L) 8.8(L)  Total Protein 6.5 - 8.1 g/dL 6.4(L) 6.9 6.7  Total Bilirubin 0.3 - 1.2 mg/dL 0.5 0.6 0.9  Alkaline Phos 38 - 126 U/L 42 47 38  AST 15 - 41 U/L 9(L) 11(L) 18  ALT 0 - 44 U/L _2 DIAGNOSTIC IMAGING:  I have reviewed the scans and discussed with patient and daughter.   I have reviewed Venita Lick LPN's note and agree with the documentation.  I personally performed a face-to-face visit, made revisions and my assessment and plan is as  follows.    ASSESSMENT & PLAN:   Prostate cancer ([place]) 1.  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 -Enzalutamide started on 02/09/2019. -He receives Lupron injections with [name].  He will have suprapubic catheter changed once a month. -Last PSA on 01/22/2020 was 0.05.  He is continuing to tolerate enzalutamide very well. -We reviewed CTAP from 02/22/2020 which shows prostatic mass stable at 5.7 x 4.7 cm.  However appears to increasingly directly invade the anterior mid to lower rectal wall.  Bone metastasis in the spine and pelvic girdle are stable with no new meta stasis.  No adenopathy. -His labs on 01/22/2020 and 02/19/2020 reviewed by me were grossly normal. -I will reach out to Drs. Tammi Klippel ans Roni Bread anything more can be offered. -I will reevaluate him in 4 weeks.  2.  Low back pain: -Prostate biopsy on 10/20/2019 consistent with squamous cell carcinoma the prostate. -XRT to the prostate from 10/30/2019 through 11/13/2019, 30 Gray in 10 fractions with improvement in pain. -He developed rectal pain  again in the last couple of weeks.  We have done a CT scan which shows prostate mass directly invading the lower rectal wall. -He is currently taking tramadol and Tylenol every 6 hours.  I will give him prescription for hydrocodone 5/325 to be taken at bedtime as needed.  3.  Bone metastasis: -He will continue monthly denosumab. -He will continue taking calcium 600 mg daily.  Calcium is 8.6 and creatinine normal.  4.  Suprapubic catheter: -He does not report any hematuria.  He is having the catheter changed with [name].      Orders placed this encounter:  Orders Placed This Encounter  Procedures  . CBC with Differential  . PSA   Total time spent is 30 minutes with more than 50% of the time spent face-to-face discussing scan results, lab results, counseling and coordination of care.   Elijah Jack,  MD [place] [phone]

## 2020-02-26 NOTE — Patient Instructions (Addendum)
[place] at [facility] Discharge Instructions  You were seen today by [name]. He went over your recent lab results. He will send in a new prescription for hydrocodone 5/325mg  to take at bedtime. Continue taking the tramadol during the day. Continue taking the Xtandi as well. He will see you back in 1 month for labs and follow up.   Thank you for choosing [place] at [facility] to provide your oncology and hematology care.  To afford each patient quality time with our provider, please arrive at least 15 minutes before your scheduled appointment time.   If you have a lab appointment with the [facility] please come in thru the  Main Entrance and check in at the main information desk  You need to re-schedule your appointment should you arrive 10 or more minutes late.  We strive to give you quality time with our providers, and arriving late affects you and other patients whose appointments are after yours.  Also, if you no show three or more times for appointments you may be dismissed from the clinic at the providers discretion.     Again, thank you for choosing [facility].  Our hope is that these requests will decrease the amount of time that you wait before being seen by our physicians.       _____________________________________________________________  Should you have questions after your visit to [facility], please contact our office at [phone] between the hours of 8:00 a.m. and 4:30 p.m.  Voicemails left after 4:00 p.m. will not be returned until the following business day.  For prescription refill requests, have your pharmacy contact our office and allow 72 hours.    Cancer Center Support Programs:   > Cancer Support Group  2nd Tuesday of the month 1pm-2pm, Journey Room

## 2020-02-28 ENCOUNTER — Telehealth: Payer: Self-pay

## 2020-02-28 NOTE — Telephone Encounter (Signed)
See Mychart message. Drt notified.

## 2020-03-04 ENCOUNTER — Ambulatory Visit (HOSPITAL_COMMUNITY): Payer: Medicare HMO

## 2020-03-05 ENCOUNTER — Other Ambulatory Visit: Payer: Self-pay

## 2020-03-05 ENCOUNTER — Ambulatory Visit (HOSPITAL_COMMUNITY): Payer: Medicare HMO | Admitting: Hematology

## 2020-03-05 ENCOUNTER — Ambulatory Visit
Admission: RE | Admit: 2020-03-05 | Discharge: 2020-03-05 | Disposition: A | Discharge status: Home / Self Care | Payer: Medicare HMO | Source: Ambulatory Visit | Attending: Radiation Oncology | Admitting: Radiation Oncology

## 2020-03-05 ENCOUNTER — Other Ambulatory Visit (HOSPITAL_COMMUNITY): Payer: Self-pay | Admitting: *Deleted

## 2020-03-05 DIAGNOSIS — C61 Malignant neoplasm of prostate: Secondary | ICD-10-CM | POA: Insufficient documentation

## 2020-03-05 DIAGNOSIS — K6289 Other specified diseases of anus and rectum: Secondary | ICD-10-CM | POA: Diagnosis not present

## 2020-03-05 DIAGNOSIS — C7951 Secondary malignant neoplasm of bone: Secondary | ICD-10-CM

## 2020-03-05 MED ORDER — TRAMADOL HCL 50 MG PO TABS: 100.0000 mg | ORAL_TABLET | Freq: Four times a day (QID) | ORAL | 0 refills | Status: DC | PRN

## 2020-03-05 NOTE — Progress Notes (Signed)
  Radiation Oncology         [phone] ________________________________  Name: Elijah Santana MRN: [id number]  Date: 03/05/2020  DOB: 1932/07/17  SIMULATION AND TREATMENT PLANNING NOTE    ICD-10-CM   1. Prostate cancer [facility])  C61     DIAGNOSIS:  86 y.o. 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  NARRATIVE:  The patient was brought to the [facility].  Identity was confirmed.  All relevant records and images related to the planned course of therapy were reviewed.  The patient freely provided informed written consent to proceed with treatment after reviewing the details related to the planned course of therapy. The consent form was witnessed and verified by the simulation staff.  Then, the patient was set-up in a stable reproducible  supine position for radiation therapy.  CT images were obtained.  Surface markings were placed.  The CT images were loaded into the planning software.  Then the target and avoidance structures were contoured.  Treatment planning then occurred.  The radiation prescription was entered and confirmed.  Then, I designed and supervised the construction of a total of 5 medically necessary complex treatment devices including vac-loc positioner and 4 MLCs to shield rectum and bladder maximally.  I have requested : 3D Simulation  I have requested a DVH of the following structures: rectum, bladder, left femoral neck, right femoral neck and target.  SPECIAL TREATMENT PROCEDURE:  The planned course of therapy using radiation constitutes a special treatment procedure. Special care is required in the management of this patient for the following reasons. This treatment constitutes a Special Treatment Procedure for the following reason: [ Retreatment in a previously radiated area requiring careful monitoring of increased risk of toxicity  due to overlap of previous treatment..  The special nature of the planned course of radiotherapy will require increased physician supervision and oversight to ensure patient's safety with optimal treatment outcomes.  PLAN:  The patient will be re-treated to receive 30 Gy in 10 fractions.  ________________________________  Sheral Apley Tammi Klippel, M.D.

## 2020-03-05 NOTE — Telephone Encounter (Signed)
I spoke with patient's daughter this morning and she reports that the new hydrocodone 5/325 is making him sick. He prefers to take the tramadol but it is not lasting the full 6 hours.  He is taking it with an extra strength tylenol.  I spoke with [name] and he wants patient to take 100 mg tramadol every 6 hours and the tylenol in between if needed.  [name], his daughter was instructed to call us back in a few days and let us know if this plan is not working. She verbalizes understanding and appreciation.  New orders per [name] entered and sent to him to sign.

## 2020-03-06 DIAGNOSIS — I10 Essential (primary) hypertension: Secondary | ICD-10-CM | POA: Diagnosis not present

## 2020-03-06 DIAGNOSIS — I2510 Atherosclerotic heart disease of native coronary artery without angina pectoris: Secondary | ICD-10-CM | POA: Diagnosis not present

## 2020-03-06 DIAGNOSIS — M159 Polyosteoarthritis, unspecified: Secondary | ICD-10-CM | POA: Diagnosis not present

## 2020-03-07 DIAGNOSIS — K6289 Other specified diseases of anus and rectum: Secondary | ICD-10-CM | POA: Diagnosis not present

## 2020-03-07 DIAGNOSIS — C61 Malignant neoplasm of prostate: Secondary | ICD-10-CM | POA: Diagnosis not present

## 2020-03-08 ENCOUNTER — Ambulatory Visit (INDEPENDENT_AMBULATORY_CARE_PROVIDER_SITE_OTHER): Payer: Medicare HMO

## 2020-03-08 ENCOUNTER — Other Ambulatory Visit: Payer: Self-pay

## 2020-03-08 VITALS — Temp 97.7°F

## 2020-03-08 DIAGNOSIS — R339 Retention of urine, unspecified: Secondary | ICD-10-CM

## 2020-03-08 NOTE — Progress Notes (Signed)
Cath Change/ Replacement  Patient is present today for a catheter change due to urinary retention.  9 ml of water was removed from the balloon, a 16FR foley cath was removed with out difficulty.  Patient was cleaned and prepped in a sterile fashion with betadine. A 16 FR foley cath was replaced into the bladder no complications were noted Urine return was noted 39ml and urine was yellow in color. The balloon was filled with 43ml of sterile water. A leg bag was attached for drainage. Patient was given proper instruction on catheter care.    Performed by: Armed forces operational officer, d, lpn  Follow up: Jeffie Pollock,

## 2020-03-12 ENCOUNTER — Ambulatory Visit
Admission: RE | Admit: 2020-03-12 | Discharge: 2020-03-12 | Disposition: A | Discharge status: Home / Self Care | Payer: Medicare HMO | Source: Ambulatory Visit | Attending: Radiation Oncology | Admitting: Radiation Oncology

## 2020-03-12 ENCOUNTER — Other Ambulatory Visit: Payer: Self-pay

## 2020-03-12 DIAGNOSIS — C61 Malignant neoplasm of prostate: Secondary | ICD-10-CM | POA: Diagnosis not present

## 2020-03-12 DIAGNOSIS — K6289 Other specified diseases of anus and rectum: Secondary | ICD-10-CM | POA: Diagnosis not present

## 2020-03-13 ENCOUNTER — Other Ambulatory Visit: Payer: Self-pay

## 2020-03-13 ENCOUNTER — Ambulatory Visit
Admission: RE | Admit: 2020-03-13 | Discharge: 2020-03-13 | Disposition: A | Discharge status: Home / Self Care | Payer: Medicare HMO | Source: Ambulatory Visit | Attending: Radiation Oncology | Admitting: Radiation Oncology

## 2020-03-13 DIAGNOSIS — Z7401 Bed confinement status: Secondary | ICD-10-CM | POA: Diagnosis not present

## 2020-03-13 DIAGNOSIS — C778 Secondary and unspecified malignant neoplasm of lymph nodes of multiple regions: Secondary | ICD-10-CM | POA: Diagnosis not present

## 2020-03-13 DIAGNOSIS — R110 Nausea: Secondary | ICD-10-CM | POA: Diagnosis not present

## 2020-03-13 DIAGNOSIS — R0602 Shortness of breath: Secondary | ICD-10-CM | POA: Diagnosis not present

## 2020-03-13 DIAGNOSIS — R1084 Generalized abdominal pain: Secondary | ICD-10-CM | POA: Diagnosis not present

## 2020-03-13 DIAGNOSIS — I214 Non-ST elevation (NSTEMI) myocardial infarction: Secondary | ICD-10-CM | POA: Diagnosis not present

## 2020-03-13 DIAGNOSIS — M549 Dorsalgia, unspecified: Secondary | ICD-10-CM | POA: Diagnosis present

## 2020-03-13 DIAGNOSIS — Z9359 Other cystostomy status: Secondary | ICD-10-CM | POA: Diagnosis not present

## 2020-03-13 DIAGNOSIS — H9190 Unspecified hearing loss, unspecified ear: Secondary | ICD-10-CM | POA: Diagnosis present

## 2020-03-13 DIAGNOSIS — E876 Hypokalemia: Secondary | ICD-10-CM | POA: Diagnosis not present

## 2020-03-13 DIAGNOSIS — I10 Essential (primary) hypertension: Secondary | ICD-10-CM | POA: Diagnosis not present

## 2020-03-13 DIAGNOSIS — Z7189 Other specified counseling: Secondary | ICD-10-CM | POA: Diagnosis not present

## 2020-03-13 DIAGNOSIS — E782 Mixed hyperlipidemia: Secondary | ICD-10-CM | POA: Diagnosis present

## 2020-03-13 DIAGNOSIS — K6289 Other specified diseases of anus and rectum: Secondary | ICD-10-CM | POA: Diagnosis not present

## 2020-03-13 DIAGNOSIS — I639 Cerebral infarction, unspecified: Secondary | ICD-10-CM | POA: Diagnosis not present

## 2020-03-13 DIAGNOSIS — Z20822 Contact with and (suspected) exposure to covid-19: Secondary | ICD-10-CM | POA: Diagnosis not present

## 2020-03-13 DIAGNOSIS — K8020 Calculus of gallbladder without cholecystitis without obstruction: Secondary | ICD-10-CM | POA: Diagnosis not present

## 2020-03-13 DIAGNOSIS — M1990 Unspecified osteoarthritis, unspecified site: Secondary | ICD-10-CM | POA: Diagnosis present

## 2020-03-13 DIAGNOSIS — F0391 Unspecified dementia with behavioral disturbance: Secondary | ICD-10-CM | POA: Diagnosis not present

## 2020-03-13 DIAGNOSIS — Z515 Encounter for palliative care: Secondary | ICD-10-CM | POA: Diagnosis not present

## 2020-03-13 DIAGNOSIS — C61 Malignant neoplasm of prostate: Secondary | ICD-10-CM | POA: Diagnosis not present

## 2020-03-13 DIAGNOSIS — Z03818 Encounter for observation for suspected exposure to other biological agents ruled out: Secondary | ICD-10-CM | POA: Diagnosis not present

## 2020-03-13 DIAGNOSIS — G8929 Other chronic pain: Secondary | ICD-10-CM | POA: Diagnosis present

## 2020-03-13 DIAGNOSIS — K56609 Unspecified intestinal obstruction, unspecified as to partial versus complete obstruction: Secondary | ICD-10-CM | POA: Diagnosis not present

## 2020-03-13 DIAGNOSIS — K219 Gastro-esophageal reflux disease without esophagitis: Secondary | ICD-10-CM | POA: Diagnosis not present

## 2020-03-13 DIAGNOSIS — K565 Intestinal adhesions [bands], unspecified as to partial versus complete obstruction: Secondary | ICD-10-CM | POA: Diagnosis not present

## 2020-03-13 DIAGNOSIS — K5651 Intestinal adhesions [bands], with partial obstruction: Secondary | ICD-10-CM | POA: Diagnosis not present

## 2020-03-13 DIAGNOSIS — I480 Paroxysmal atrial fibrillation: Secondary | ICD-10-CM | POA: Diagnosis not present

## 2020-03-13 DIAGNOSIS — R52 Pain, unspecified: Secondary | ICD-10-CM | POA: Diagnosis not present

## 2020-03-13 DIAGNOSIS — N400 Enlarged prostate without lower urinary tract symptoms: Secondary | ICD-10-CM | POA: Diagnosis present

## 2020-03-13 DIAGNOSIS — I252 Old myocardial infarction: Secondary | ICD-10-CM | POA: Diagnosis not present

## 2020-03-13 DIAGNOSIS — E119 Type 2 diabetes mellitus without complications: Secondary | ICD-10-CM | POA: Diagnosis present

## 2020-03-13 DIAGNOSIS — C7951 Secondary malignant neoplasm of bone: Secondary | ICD-10-CM | POA: Diagnosis not present

## 2020-03-13 DIAGNOSIS — Z7902 Long term (current) use of antithrombotics/antiplatelets: Secondary | ICD-10-CM | POA: Diagnosis not present

## 2020-03-13 DIAGNOSIS — Z66 Do not resuscitate: Secondary | ICD-10-CM | POA: Diagnosis not present

## 2020-03-13 DIAGNOSIS — Z79899 Other long term (current) drug therapy: Secondary | ICD-10-CM | POA: Diagnosis not present

## 2020-03-13 DIAGNOSIS — K5909 Other constipation: Secondary | ICD-10-CM | POA: Diagnosis not present

## 2020-03-13 DIAGNOSIS — R531 Weakness: Secondary | ICD-10-CM | POA: Diagnosis not present

## 2020-03-13 DIAGNOSIS — Z951 Presence of aortocoronary bypass graft: Secondary | ICD-10-CM | POA: Diagnosis not present

## 2020-03-13 DIAGNOSIS — I255 Ischemic cardiomyopathy: Secondary | ICD-10-CM | POA: Diagnosis not present

## 2020-03-13 DIAGNOSIS — R109 Unspecified abdominal pain: Secondary | ICD-10-CM | POA: Diagnosis not present

## 2020-03-13 DIAGNOSIS — E785 Hyperlipidemia, unspecified: Secondary | ICD-10-CM | POA: Diagnosis not present

## 2020-03-13 DIAGNOSIS — F05 Delirium due to known physiological condition: Secondary | ICD-10-CM | POA: Diagnosis not present

## 2020-03-13 DIAGNOSIS — Z9861 Coronary angioplasty status: Secondary | ICD-10-CM | POA: Diagnosis not present

## 2020-03-13 DIAGNOSIS — J45909 Unspecified asthma, uncomplicated: Secondary | ICD-10-CM | POA: Diagnosis present

## 2020-03-13 DIAGNOSIS — I2510 Atherosclerotic heart disease of native coronary artery without angina pectoris: Secondary | ICD-10-CM | POA: Diagnosis not present

## 2020-03-14 ENCOUNTER — Other Ambulatory Visit: Payer: Self-pay

## 2020-03-14 ENCOUNTER — Ambulatory Visit
Admission: RE | Admit: 2020-03-14 | Discharge: 2020-03-14 | Disposition: A | Discharge status: Home / Self Care | Payer: Medicare HMO | Source: Ambulatory Visit | Attending: Radiation Oncology | Admitting: Radiation Oncology

## 2020-03-14 DIAGNOSIS — C61 Malignant neoplasm of prostate: Secondary | ICD-10-CM | POA: Diagnosis not present

## 2020-03-14 MED FILL — XTANDI 40 MG CAPSULE: 40 | 30 days supply | Qty: 120 | Fill #3

## 2020-03-15 ENCOUNTER — Ambulatory Visit
Admission: RE | Admit: 2020-03-15 | Discharge: 2020-03-15 | Disposition: A | Discharge status: Home / Self Care | Payer: Medicare HMO | Source: Ambulatory Visit | Attending: Radiation Oncology | Admitting: Radiation Oncology

## 2020-03-15 ENCOUNTER — Encounter: Payer: Self-pay | Admitting: Radiation Oncology

## 2020-03-15 ENCOUNTER — Other Ambulatory Visit: Payer: Self-pay

## 2020-03-15 DIAGNOSIS — K6289 Other specified diseases of anus and rectum: Secondary | ICD-10-CM | POA: Diagnosis not present

## 2020-03-15 DIAGNOSIS — C61 Malignant neoplasm of prostate: Secondary | ICD-10-CM | POA: Diagnosis not present

## 2020-03-16 ENCOUNTER — Inpatient Hospital Stay (HOSPITAL_COMMUNITY)
Admission: EM | Admit: 2020-03-16 | Discharge: 2020-03-22 | DRG: 389 | Disposition: A | Discharge status: Skilled Nursing Facility | Payer: Medicare HMO | Attending: Internal Medicine | Admitting: Internal Medicine

## 2020-03-16 ENCOUNTER — Emergency Department (HOSPITAL_COMMUNITY): Payer: Medicare HMO

## 2020-03-16 ENCOUNTER — Encounter (HOSPITAL_COMMUNITY): Payer: Self-pay | Admitting: *Deleted

## 2020-03-16 ENCOUNTER — Other Ambulatory Visit: Payer: Self-pay

## 2020-03-16 DIAGNOSIS — M1990 Unspecified osteoarthritis, unspecified site: Secondary | ICD-10-CM | POA: Diagnosis present

## 2020-03-16 DIAGNOSIS — K5651 Intestinal adhesions [bands], with partial obstruction: Secondary | ICD-10-CM | POA: Diagnosis present

## 2020-03-16 DIAGNOSIS — Z9359 Other cystostomy status: Secondary | ICD-10-CM | POA: Diagnosis not present

## 2020-03-16 DIAGNOSIS — J45909 Unspecified asthma, uncomplicated: Secondary | ICD-10-CM | POA: Diagnosis present

## 2020-03-16 DIAGNOSIS — K565 Intestinal adhesions [bands], unspecified as to partial versus complete obstruction: Secondary | ICD-10-CM

## 2020-03-16 DIAGNOSIS — I10 Essential (primary) hypertension: Secondary | ICD-10-CM | POA: Diagnosis present

## 2020-03-16 DIAGNOSIS — I480 Paroxysmal atrial fibrillation: Secondary | ICD-10-CM | POA: Diagnosis present

## 2020-03-16 DIAGNOSIS — Z79899 Other long term (current) drug therapy: Secondary | ICD-10-CM | POA: Diagnosis not present

## 2020-03-16 DIAGNOSIS — K56609 Unspecified intestinal obstruction, unspecified as to partial versus complete obstruction: Secondary | ICD-10-CM | POA: Diagnosis present

## 2020-03-16 DIAGNOSIS — I252 Old myocardial infarction: Secondary | ICD-10-CM

## 2020-03-16 DIAGNOSIS — Z0189 Encounter for other specified special examinations: Secondary | ICD-10-CM

## 2020-03-16 DIAGNOSIS — Z7189 Other specified counseling: Secondary | ICD-10-CM

## 2020-03-16 DIAGNOSIS — Z951 Presence of aortocoronary bypass graft: Secondary | ICD-10-CM

## 2020-03-16 DIAGNOSIS — C7951 Secondary malignant neoplasm of bone: Secondary | ICD-10-CM | POA: Diagnosis present

## 2020-03-16 DIAGNOSIS — K5909 Other constipation: Secondary | ICD-10-CM | POA: Diagnosis present

## 2020-03-16 DIAGNOSIS — Z9861 Coronary angioplasty status: Secondary | ICD-10-CM

## 2020-03-16 DIAGNOSIS — C61 Malignant neoplasm of prostate: Secondary | ICD-10-CM | POA: Diagnosis not present

## 2020-03-16 DIAGNOSIS — Z515 Encounter for palliative care: Secondary | ICD-10-CM

## 2020-03-16 DIAGNOSIS — N400 Enlarged prostate without lower urinary tract symptoms: Secondary | ICD-10-CM | POA: Diagnosis present

## 2020-03-16 DIAGNOSIS — Z8673 Personal history of transient ischemic attack (TIA), and cerebral infarction without residual deficits: Secondary | ICD-10-CM

## 2020-03-16 DIAGNOSIS — Z85828 Personal history of other malignant neoplasm of skin: Secondary | ICD-10-CM

## 2020-03-16 DIAGNOSIS — Z20822 Contact with and (suspected) exposure to covid-19: Secondary | ICD-10-CM | POA: Diagnosis present

## 2020-03-16 DIAGNOSIS — Z66 Do not resuscitate: Secondary | ICD-10-CM | POA: Diagnosis present

## 2020-03-16 DIAGNOSIS — Z8701 Personal history of pneumonia (recurrent): Secondary | ICD-10-CM

## 2020-03-16 DIAGNOSIS — E119 Type 2 diabetes mellitus without complications: Secondary | ICD-10-CM | POA: Diagnosis present

## 2020-03-16 DIAGNOSIS — F05 Delirium due to known physiological condition: Secondary | ICD-10-CM | POA: Diagnosis present

## 2020-03-16 DIAGNOSIS — F0391 Unspecified dementia with behavioral disturbance: Secondary | ICD-10-CM | POA: Diagnosis present

## 2020-03-16 DIAGNOSIS — I255 Ischemic cardiomyopathy: Secondary | ICD-10-CM | POA: Diagnosis present

## 2020-03-16 DIAGNOSIS — E876 Hypokalemia: Secondary | ICD-10-CM | POA: Diagnosis not present

## 2020-03-16 DIAGNOSIS — H9190 Unspecified hearing loss, unspecified ear: Secondary | ICD-10-CM | POA: Diagnosis present

## 2020-03-16 DIAGNOSIS — M549 Dorsalgia, unspecified: Secondary | ICD-10-CM | POA: Diagnosis present

## 2020-03-16 DIAGNOSIS — I2510 Atherosclerotic heart disease of native coronary artery without angina pectoris: Secondary | ICD-10-CM | POA: Diagnosis present

## 2020-03-16 DIAGNOSIS — K219 Gastro-esophageal reflux disease without esophagitis: Secondary | ICD-10-CM | POA: Diagnosis present

## 2020-03-16 DIAGNOSIS — Z8546 Personal history of malignant neoplasm of prostate: Secondary | ICD-10-CM

## 2020-03-16 DIAGNOSIS — G8929 Other chronic pain: Secondary | ICD-10-CM | POA: Diagnosis present

## 2020-03-16 DIAGNOSIS — Z87891 Personal history of nicotine dependence: Secondary | ICD-10-CM

## 2020-03-16 DIAGNOSIS — Z7901 Long term (current) use of anticoagulants: Secondary | ICD-10-CM

## 2020-03-16 DIAGNOSIS — E782 Mixed hyperlipidemia: Secondary | ICD-10-CM | POA: Diagnosis present

## 2020-03-16 DIAGNOSIS — Z8249 Family history of ischemic heart disease and other diseases of the circulatory system: Secondary | ICD-10-CM

## 2020-03-16 DIAGNOSIS — Z955 Presence of coronary angioplasty implant and graft: Secondary | ICD-10-CM

## 2020-03-16 DIAGNOSIS — Z7902 Long term (current) use of antithrombotics/antiplatelets: Secondary | ICD-10-CM | POA: Diagnosis not present

## 2020-03-16 LAB — CBC WITH DIFFERENTIAL/PLATELET
Abs Immature Granulocytes: 0.05 10*3/uL (ref 0.00–0.07)
Basophils Absolute: 0 10*3/uL (ref 0.0–0.1)
Basophils Relative: 0 %
Eosinophils Absolute: 0 10*3/uL (ref 0.0–0.5)
Eosinophils Relative: 0 %
HCT: 42.8 % (ref 39.0–52.0)
Hemoglobin: 12.7 g/dL — ABNORMAL LOW (ref 13.0–17.0)
Immature Granulocytes: 0 %
Lymphocytes Relative: 6 %
Lymphs Abs: 0.7 10*3/uL (ref 0.7–4.0)
MCH: 25 pg — ABNORMAL LOW (ref 26.0–34.0)
MCHC: 29.7 g/dL — ABNORMAL LOW (ref 30.0–36.0)
MCV: 84.4 fL (ref 80.0–100.0)
Monocytes Absolute: 0.3 10*3/uL (ref 0.1–1.0)
Monocytes Relative: 3 %
Neutro Abs: 10.4 10*3/uL — ABNORMAL HIGH (ref 1.7–7.7)
Neutrophils Relative %: 91 %
Platelets: 408 10*3/uL — ABNORMAL HIGH (ref 150–400)
RBC: 5.07 MIL/uL (ref 4.22–5.81)
RDW: 16.4 % — ABNORMAL HIGH (ref 11.5–15.5)
WBC: 11.4 10*3/uL — ABNORMAL HIGH (ref 4.0–10.5)
nRBC: 0 % (ref 0.0–0.2)

## 2020-03-16 LAB — RESPIRATORY PANEL BY RT PCR (FLU A&B, COVID)
Influenza A by PCR: NEGATIVE
Influenza B by PCR: NEGATIVE
SARS Coronavirus 2 by RT PCR: NEGATIVE

## 2020-03-16 LAB — COMPREHENSIVE METABOLIC PANEL
ALT: 10 U/L (ref 0–44)
AST: 12 U/L — ABNORMAL LOW (ref 15–41)
Albumin: 3.5 g/dL (ref 3.5–5.0)
Alkaline Phosphatase: 55 U/L (ref 38–126)
Anion gap: 13 (ref 5–15)
BUN: 19 mg/dL (ref 8–23)
CO2: 22 mmol/L (ref 22–32)
Calcium: 9.1 mg/dL (ref 8.9–10.3)
Chloride: 100 mmol/L (ref 98–111)
Creatinine, Ser: 0.61 mg/dL (ref 0.61–1.24)
GFR calc Af Amer: >60 mL/min (ref >60)
GFR calc non Af Amer: >60 mL/min (ref >60)
Glucose, Bld: 180 mg/dL — ABNORMAL HIGH (ref 70–99)
Potassium: 3.7 mmol/L (ref 3.5–5.1)
Sodium: 135 mmol/L (ref 135–145)
Total Bilirubin: 0.6 mg/dL (ref 0.3–1.2)
Total Protein: 8 g/dL (ref 6.5–8.1)

## 2020-03-16 LAB — TROPONIN I (HIGH SENSITIVITY)
Troponin I (High Sensitivity): 3 ng/L (ref <18)
Troponin I (High Sensitivity): 4 ng/L (ref <18)

## 2020-03-16 LAB — LIPASE, BLOOD: Lipase: 17 U/L (ref 11–51)

## 2020-03-16 MED ORDER — MORPHINE SULFATE (PF) 2 MG/ML IV SOLN
2.0000 mg | INTRAVENOUS | Status: DC | PRN
  Administered 2020-03-17 – 2020-03-20 (×13): 2 mg via INTRAVENOUS
  Filled 2020-03-16 (×13): qty 1

## 2020-03-16 MED ORDER — MORPHINE SULFATE (PF) 4 MG/ML IV SOLN
4.0000 mg | INTRAVENOUS | Status: DC | PRN
  Administered 2020-03-16: 4 mg via INTRAVENOUS
  Filled 2020-03-16: qty 1

## 2020-03-16 MED ORDER — SODIUM CHLORIDE 0.9 % IV BOLUS
1000.0000 mL | Freq: Once | INTRAVENOUS | Status: AC
  Administered 2020-03-16: 1000 mL via INTRAVENOUS

## 2020-03-16 MED ORDER — LACTATED RINGERS IV SOLN: INTRAVENOUS | Status: DC

## 2020-03-16 MED ORDER — ACETAMINOPHEN 650 MG RE SUPP: 650.0000 mg | Freq: Four times a day (QID) | RECTAL | Status: DC | PRN

## 2020-03-16 MED ORDER — METOPROLOL TARTRATE 5 MG/5ML IV SOLN
2.5000 mg | Freq: Three times a day (TID) | INTRAVENOUS | Status: DC
  Administered 2020-03-16 – 2020-03-17 (×2): 2.5 mg via INTRAVENOUS
  Filled 2020-03-16 (×2): qty 5

## 2020-03-16 MED ORDER — ONDANSETRON HCL 4 MG/2ML IJ SOLN
4.0000 mg | Freq: Four times a day (QID) | INTRAMUSCULAR | Status: DC | PRN
  Administered 2020-03-16 – 2020-03-22 (×6): 4 mg via INTRAVENOUS
  Filled 2020-03-16 (×6): qty 2

## 2020-03-16 MED ORDER — IOHEXOL 300 MG/ML  SOLN
100.0000 mL | Freq: Once | INTRAMUSCULAR | Status: AC | PRN
  Administered 2020-03-16: 100 mL via INTRAVENOUS

## 2020-03-16 MED ORDER — ACETAMINOPHEN 325 MG PO TABS
650.0000 mg | ORAL_TABLET | Freq: Four times a day (QID) | ORAL | Status: DC | PRN
  Administered 2020-03-20 – 2020-03-21 (×4): 650 mg via ORAL
  Filled 2020-03-16 (×5): qty 2

## 2020-03-16 NOTE — H&P (Addendum)
TRH H&P    Patient Demographics:    Elijah Santana, is a 86 y.o. male  MRN: [id number]  DOB - 03-26-1932  Admit Date - 03/16/2020  Referring MD/NP/PA: Krista Blue  Outpatient Primary MD for the patient is Glenda Chroman, MD  Patient coming from: Home  Chief complaint-abdominal pain   HPI:    Elijah Santana  is a 86 y.o. 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  Patient is receiving chemotherapy and radiation with last treatment yesterday.  Patient has been feeling bloating.  Patient completed his 4 rounds of radiation over the course of the past 4 days.  Patient has become more fatigued and weak since he got radiation treatments.  In the ED CT of the abdomen pelvis was done which showed small bowel obstruction with transition point in the right side of the abdominal likely due to adhesions.  General surgery was consulted by ED provider, Dr. Arnoldo Morale recommended putting NG tube.  Holding Plavix and Eliquis at this time.  He denies chest pain, Denies shortness of breath. He has been having retching and nausea but no vomiting As per daughter patient has been having hallucinations at night for past few months   Review of systems:    In addition to the HPI above,    All other systems reviewed and are negative.    Past History of the following :    Past Medical History:  Diagnosis Date  . Arthritis   . Asthma    Childhood  . Chronic back pain   . Coronary atherosclerosis of native coronary artery    a. CABG 2004 with LIMA-LAD, SVG-D1, SVG-OM, SVG-PDA. b. Cath ~2010 with occ of SVG-diagonal, distal LAD and OM filiing by collaterals. c. NSTEMI 12/2014 s/p DES  to SVG-RCA. d. 11/2015: DES to distal graft of the SVG-distal RCA  . Essential hypertension   . Family history of brain cancer   . Family history of pancreatic cancer   . Foley catheter in place   . GERD (gastroesophageal reflux disease)   . Hard of hearing   . History of pneumonia   . Hyperglycemia   . Ischemic cardiomyopathy    a. EF 45% by cath 12/2014.  . Mixed hyperlipidemia   . NSTEMI (non-ST elevated myocardial infarction) ([place]) 01/01/15  . Paroxysmal atrial fibrillation (HCC)   . Prostate cancer [facility])    Radiation therapy 1996  . Skin cancer   . Stroke [facility])    TIA- 28 years ago       Past Surgical History:  Procedure Laterality Date  . CARDIAC CATHETERIZATION  01/01/2015   Procedure: CORONARY STENT INTERVENTION;  Surgeon: Peter M Martinique, MD;  Location: [facility] CATH LAB;  Service: Cardiovascular;;  SVG to PDA  . CARDIAC CATHETERIZATION N/A 11/21/2015   Procedure: Left Heart Cath and Cors/Grafts Angiography;  Surgeon: Troy Sine,  MD;  Location: [place] CV LAB;  Service: Cardiovascular;  Laterality: N/A;  . CARDIAC CATHETERIZATION N/A 11/21/2015   Procedure: Coronary Stent Intervention;  Surgeon: Troy Sine, MD;  Location: [place] CV LAB;  Service: Cardiovascular;  Laterality: N/A;  . CARDIAC CATHETERIZATION N/A 09/11/2016   Procedure: Left Heart Cath and Cors/Grafts Angiography;  Surgeon: Leonie Man, MD;  Location: [place] CV LAB;  Service: Cardiovascular;  Laterality: N/A;  . CARDIAC CATHETERIZATION N/A 09/11/2016   Procedure: Coronary Stent Intervention;  Surgeon: Leonie Man, MD;  Location: [place] CV LAB;  Service: Cardiovascular;  Laterality: N/A;  . CORONARY ARTERY BYPASS GRAFT  2004   LIMA to LAD, SVG to diagonal, SVG to circumflex, SVG to PDA  . CYSTOSCOPY WITH URETHRAL DILATATION N/A 03/28/2019   Procedure: CYSTOSCOPY WITH URETHRAL  DILATATION OF STRICTURE;  Surgeon: Irine Seal, MD;  Location: [facility] ORS;  Service: Urology;  Laterality: N/A;  .  GREEN LIGHT LASER TURP (TRANSURETHRAL RESECTION OF PROSTATE N/A 06/04/2016   Procedure: GREEN LIGHT LASER TURP (TRANSURETHRAL RESECTION OF PROSTATE;  Surgeon: Irine Seal, MD;  Location: [facility] ORS;  Service: Urology;  Laterality: N/A;  . LEFT HEART CATHETERIZATION WITH CORONARY/GRAFT ANGIOGRAM N/A 01/01/2015   Procedure: LEFT HEART CATHETERIZATION WITH Beatrix Fetters;  Surgeon: Peter M Martinique, MD;  Location: [facility] CATH LAB;  Service: Cardiovascular;  Laterality: N/A;  . PERCUTANEOUS CORONARY STENT INTERVENTION (PCI-S)  11/20/2015   distal SVG  with DES       . TONSILLECTOMY        Social History:      Social History   Tobacco Use  . Smoking status: Former Smoker    Packs/day: 0.50    Years: 27.00    Pack years: 13.50    Types: Cigars    Start date: 07/28/1964    Quit date: 07/28/1986    Years since quitting: 33.6  . Smokeless tobacco: Former Systems developer    Types: Chew    Quit date: 08/07/2010  . Tobacco comment: never chewed up over a pack/day  Substance Use Topics  . Alcohol use: No    Alcohol/week: 0.0 standard drinks       Family History :     Family History  Problem Relation Age of Onset  . Hypertension Father   . Coronary artery disease Father   . Pancreatic cancer Mother 67  . Brain cancer Sister 38  . Brain cancer Nephew        dx age 72, d. 14s      Home Medications:   Prior to Admission medications   Medication Sig Start Date End Date Taking? Authorizing Provider  amLODipine (NORVASC) 5 MG tablet TAKE ONE TABLET BY MOUTH DAILY (MORNING) Patient taking differently: Take 5 mg by mouth every morning.  11/22/17  Yes Satira Sark, MD  calcium carbonate (OS-CAL - DOSED IN MG OF ELEMENTAL CALCIUM) 1250 (500 Ca) MG tablet Take 1 tablet by mouth daily.    Yes [provider]  clopidogrel (PLAVIX) 75 MG tablet TAKE ONE TABLET BY MOUTH EVERY DAY (,EVENING) Patient taking differently: Take 75 mg by mouth daily.  02/05/20  Yes Satira Sark, MD  denosumab  (XGEVA) 120 MG/1.7ML SOLN injection Inject 120 mg into the skin once.    Yes [provider]  ELIQUIS 5 MG TABS tablet TAKE ONE TABLET BY MOUTH TWICE DAILY. (MORNING ,EVENING) Patient taking differently: Take 5 mg by mouth 2 (two) times daily.  12/11/19  Yes Domenic Polite,  Aloha Gell, MD  HYDROcodone-acetaminophen (NORCO) 5-325 MG tablet Take 1 tablet by mouth at bedtime as needed for moderate pain. 02/26/20  Yes Derek Jack, MD  isosorbide mononitrate (IMDUR) 60 MG 24 hr tablet TAKE ONE TABLET BY MOUTH DAILY (MORNING) Patient taking differently: Take 60 mg by mouth every morning.  06/23/19  Yes Satira Sark, MD  Leuprolide Acetate (LUPRON IJ) Inject as directed every 4 (four) months.    Yes [provider]  linaclotide Rolan Lipa) 290 MCG CAPS capsule Take 1 capsule (290 mcg total) by mouth daily before breakfast. 11/15/19  Yes Laurine Blazer A, PA-C  metoprolol tartrate (LOPRESSOR) 50 MG tablet Take 1 tablet (50 mg total) by mouth 2 (two) times daily. 06/23/19  Yes Satira Sark, MD  pantoprazole (PROTONIX) 40 MG tablet Take 40 mg by mouth every morning. 09/19/19  Yes [provider]  rosuvastatin (CRESTOR) 20 MG tablet Take 1 tablet (20 mg total) by mouth every evening. 06/23/19  Yes Satira Sark, MD  STOOL SOFTENER 100 MG capsule Take 100-200 mg by mouth daily as needed for mild constipation.  01/02/19  Yes [provider]  TOVIAZ 4 MG TB24 tablet Take 4 mg by mouth daily. 10/02/19  Yes [provider]  traMADol (ULTRAM) 50 MG tablet Take 2 tablets (100 mg total) by mouth every 6 (six) hours as needed (for pain). 03/05/20  Yes Derek Jack, MD  XTANDI 40 MG capsule TAKE 4 CAPSULES (160 MG TOTAL) BY MOUTH DAILY. Patient taking differently: Take 160 mg by mouth daily.  12/05/19  Yes Derek Jack, MD  acetaminophen (TYLENOL) 325 MG tablet Take 2 tablets (650 mg total) by mouth every 4 (four) hours as needed for headache or mild  pain. Patient not taking: Reported on 01/22/2020 09/12/16   Erlene Quan, PA-C  nitroGLYCERIN (NITROSTAT) 0.4 MG SL tablet DISSOLVE ONE TABLET UNDER TONGUE EVERY 5 MINUTES UP TO 3 DOSES AS NEEDED FOR CHEST PAIN Patient not taking: Reported on 02/26/2020 03/27/19   Satira Sark, MD     Allergies:    No Known Allergies   Physical Exam:   Vitals  Blood pressure 136/78, pulse 72, temperature (!) 97.4 F (36.3 C), temperature source Oral, resp. rate 15, height 5\' 8"  (1.727 m), SpO2 94 %.  1.  General: Appears in no acute distress  2. Psychiatric: ,00.4, intact insight and judgment  3. Neurologic: Cranial nerves II through XII grossly intact, no focal deficit noted  4. HEENMT:  Atraumatic normocephalic, extraocular muscles are intact  5. Respiratory : Clear to auscultation bilaterally, no wheezing or crackles auscultated  6. Cardiovascular : S1-S2, regular, no murmur auscultated, no edema in the lower extremities  7. Gastrointestinal:  Abdomen is soft, mild tenderness around umbilicus, bowel sounds hypoactive      Data Review:    CBC Recent Labs  Lab 03/16/20 1445  WBC 11.4*  HGB 12.7*  HCT 42.8  PLT 408*  MCV 84.4  MCH 25.0*  MCHC 29.7*  RDW 16.4*  LYMPHSABS 0.7  MONOABS 0.3  EOSABS 0.0  BASOSABS 0.0   ------------------------------------------------------------------------------------------------------------------  Results for orders placed or performed during the hospital encounter of 03/16/20 (from the past 48 hour(s))  Troponin I (High Sensitivity)     Status: None   Collection Time: 03/16/20  2:45 PM  Result Value Ref Range   Troponin I (High Sensitivity) 3 <18 ng/L    Comment: (NOTE) Elevated high sensitivity troponin I (hsTnI) values and significant  changes across  serial measurements may suggest ACS but many other  chronic and acute conditions are known to elevate hsTnI results.  Refer to the "Links" section for chest pain algorithms and  additional  guidance. Performed at [facility], [address] [place], [place] [address]   Comprehensive metabolic panel     Status: Abnormal   Collection Time: 03/16/20  2:45 PM  Result Value Ref Range   Sodium 135 135 - 145 mmol/L   Potassium 3.7 3.5 - 5.1 mmol/L   Chloride 100 98 - 111 mmol/L   CO2 22 22 - 32 mmol/L   Glucose, Bld 180 (H) 70 - 99 mg/dL    Comment: Glucose reference range applies only to samples taken after fasting for at least 8 hours.   BUN 19 8 - 23 mg/dL   Creatinine, Ser 0.61 0.61 - 1.24 mg/dL   Calcium 9.1 8.9 - 10.3 mg/dL   Total Protein 8.0 6.5 - 8.1 g/dL   Albumin 3.5 3.5 - 5.0 g/dL   AST 12 (L) 15 - 41 U/L   ALT 10 0 - 44 U/L   Alkaline Phosphatase 55 38 - 126 U/L   Total Bilirubin 0.6 0.3 - 1.2 mg/dL   GFR calc non Af Amer >60 >60 mL/min   GFR calc Af Amer >60 >60 mL/min   Anion gap 13 5 - 15    Comment: Performed at [facility], [address] [place], [place] [address]  Lipase, blood     Status: None   Collection Time: 03/16/20  2:45 PM  Result Value Ref Range   Lipase 17 11 - 51 U/L    Comment: Performed at [facility], [address], [place], [place] [address]  CBC with Differential     Status: Abnormal   Collection Time: 03/16/20  2:45 PM  Result Value Ref Range   WBC 11.4 (H) 4.0 - 10.5 K/uL   RBC 5.07 4.22 - 5.81 MIL/uL   Hemoglobin 12.7 (L) 13.0 - 17.0 g/dL   HCT 42.8 39.0 - 52.0 %   MCV 84.4 80.0 - 100.0 fL   MCH 25.0 (L) 26.0 - 34.0 pg   MCHC 29.7 (L) 30.0 - 36.0 g/dL   RDW 16.4 (H) 11.5 - 15.5 %   Platelets 408 (H) 150 - 400 K/uL   nRBC 0.0 0.0 - 0.2 %   Neutrophils Relative % 91 %   Neutro Abs 10.4 (H) 1.7 - 7.7 K/uL   Lymphocytes Relative 6 %   Lymphs Abs 0.7 0.7 - 4.0 K/uL   Monocytes Relative 3 %   Monocytes Absolute 0.3 0.1 - 1.0 K/uL   Eosinophils Relative 0 %   Eosinophils Absolute 0.0 0.0 - 0.5 K/uL   Basophils Relative 0 %   Basophils Absolute 0.0 0.0 - 0.1 K/uL   Immature Granulocytes 0 %   Abs Immature  Granulocytes 0.05 0.00 - 0.07 K/uL    Comment: Performed at [facility], [address] [place], [place] [address]  Troponin I (High Sensitivity)     Status: None   Collection Time: 03/16/20  4:28 PM  Result Value Ref Range   Troponin I (High Sensitivity) 4 <18 ng/L    Comment: (NOTE) Elevated high sensitivity troponin I (hsTnI) values and significant  changes across serial measurements may suggest ACS but many other  chronic and acute conditions are known to elevate hsTnI results.  Refer to the "Links" section for chest pain algorithms and additional  guidance. Performed at [facility],  [place], [place] [address]   Respiratory Panel by RT PCR (Flu A&B, Covid) - Nasopharyngeal Swab     Status: None   Collection Time: 03/16/20  7:21 PM   Specimen: Nasopharyngeal Swab  Result Value Ref Range   SARS Coronavirus 2 by RT PCR NEGATIVE NEGATIVE    Comment: (NOTE) SARS-CoV-2 target nucleic acids are NOT DETECTED. The SARS-CoV-2 RNA is generally detectable in upper respiratoy specimens during the acute phase of infection. The lowest concentration of SARS-CoV-2 viral copies this assay can detect is 131 copies/mL. A negative result does not preclude SARS-Cov-2 infection and should not be used as the sole basis for treatment or other patient management decisions. A negative result may occur with  improper specimen collection/handling, submission of specimen other than nasopharyngeal swab, presence of viral mutation(s) within the areas targeted by this assay, and inadequate number of viral copies (<131 copies/mL). A negative result must be combined with clinical observations, patient history, and epidemiological information. The expected result is Negative. Fact Sheet for Patients:  PinkCheek.be Fact Sheet for Healthcare Providers:  GravelBags.it This test is not yet ap proved or cleared by the [place] FDA and    has been authorized for detection and/or diagnosis of SARS-CoV-2 by FDA under an Emergency Use Authorization (EUA). This EUA will remain  in effect (meaning this test can be used) for the duration of the COVID-19 declaration under Section 564(b)(1) of the Act, 21 U.S.C. section 360bbb-3(b)(1), unless the authorization is terminated or revoked sooner.    Influenza A by PCR NEGATIVE NEGATIVE   Influenza B by PCR NEGATIVE NEGATIVE    Comment: (NOTE) The Xpert Xpress SARS-CoV-2/FLU/RSV assay is intended as an aid in  the diagnosis of influenza from Nasopharyngeal swab specimens and  should not be used as a sole basis for treatment. Nasal washings and  aspirates are unacceptable for Xpert Xpress SARS-CoV-2/FLU/RSV  testing. Fact Sheet for Patients: PinkCheek.be Fact Sheet for Healthcare Providers: GravelBags.it This test is not yet approved or cleared by the [place] FDA and  has been authorized for detection and/or diagnosis of SARS-CoV-2 by  FDA under an Emergency Use Authorization (EUA). This EUA will remain  in effect (meaning this test can be used) for the duration of the  Covid-19 declaration under Section 564(b)(1) of the Act, 21  U.S.C. section 360bbb-3(b)(1), unless the authorization is  terminated or revoked. Performed at [facility], [address] [place], [place] [address]     Chemistries  Recent Labs  Lab 03/16/20 1445  NA 135  K 3.7  CL 100  CO2 22  GLUCOSE 180*  BUN 19  CREATININE 0.61  CALCIUM 9.1  AST 12*  ALT 10  ALKPHOS 55  BILITOT 0.6   ------------------------------------------------------------------------------------------------------------------  ------------------------------------------------------------------------------------------------------------------ GFR: CrCl cannot be calculated (Unknown ideal weight.). Liver Function Tests: Recent Labs  Lab 03/16/20 1445  AST 12*   ALT 10  ALKPHOS 55  BILITOT 0.6  PROT 8.0  ALBUMIN 3.5   Recent Labs  Lab 03/16/20 1445  LIPASE 17   No results for input(s): AMMONIA in the last 168 hours. Coagulation Profile: No results for input(s): INR, PROTIME in the last 168 hours. Cardiac Enzymes: No results for input(s): CKTOTAL, CKMB, CKMBINDEX, TROPONINI in the last 168 hours. BNP (last 3 results) No results for input(s): PROBNP in the last 8760 hours. HbA1C: No results for input(s): HGBA1C in the last 72 hours. CBG: No results for input(s): GLUCAP in the last 168 hours. Lipid Profile:  No results for input(s): CHOL, HDL, LDLCALC, TRIG, CHOLHDL, LDLDIRECT in the last 72 hours. Thyroid Function Tests: No results for input(s): TSH, T4TOTAL, FREET4, T3FREE, THYROIDAB in the last 72 hours. Anemia Panel: No results for input(s): VITAMINB12, FOLATE, FERRITIN, TIBC, IRON, RETICCTPCT in the last 72 hours.  --------------------------------------------------------------------------------------------------------------- Urine analysis:    Component Value Date/Time   COLORURINE RED (A) 11/23/2019 0428   APPEARANCEUR TURBID (A) 11/23/2019 0428   LABSPEC  11/23/2019 0428    TEST NOT REPORTED DUE TO COLOR INTERFERENCE OF URINE PIGMENT   PHURINE  11/23/2019 0428    TEST NOT REPORTED DUE TO COLOR INTERFERENCE OF URINE PIGMENT   GLUCOSEU (A) 11/23/2019 0428    TEST NOT REPORTED DUE TO COLOR INTERFERENCE OF URINE PIGMENT   HGBUR (A) 11/23/2019 0428    TEST NOT REPORTED DUE TO COLOR INTERFERENCE OF URINE PIGMENT   BILIRUBINUR (A) 11/23/2019 0428    TEST NOT REPORTED DUE TO COLOR INTERFERENCE OF URINE PIGMENT   KETONESUR (A) 11/23/2019 0428    TEST NOT REPORTED DUE TO COLOR INTERFERENCE OF URINE PIGMENT   PROTEINUR >300 (A) 11/23/2019 0428   NITRITE (A) 11/23/2019 0428    TEST NOT REPORTED DUE TO COLOR INTERFERENCE OF URINE PIGMENT   LEUKOCYTESUR (A) 11/23/2019 0428    TEST NOT REPORTED DUE TO COLOR INTERFERENCE OF URINE  PIGMENT      Imaging Results:    DG Chest 1 View  Result Date: 03/16/2020 CLINICAL DATA:  Weakness and mild shortness of breath. ACS. EXAM: CHEST  1 VIEW COMPARISON:  09/10/2016 FINDINGS: Post median sternotomy and CABG. The heart is normal in size. Aortic atherosclerosis and tortuosity, stable from prior exam. No pulmonary edema, focal airspace disease, pleural effusion or pneumothorax. No acute osseous abnormalities are seen. IMPRESSION: 1. No acute abnormality. 2. Post CABG with normal heart size. Aortic Atherosclerosis (ICD10-I70.0). Electronically Signed   By: Keith Rake M.D.   On: 03/16/2020 15:40   CT ABDOMEN PELVIS W CONTRAST  Result Date: 03/16/2020 CLINICAL DATA:  Acute generalized abdominal pain. Nausea. Weakness. Patient reports constipation. Active chemotherapy. History of prostate cancer. EXAM: CT ABDOMEN AND PELVIS WITH CONTRAST TECHNIQUE: Multidetector CT imaging of the abdomen and pelvis was performed using the standard protocol following bolus administration of intravenous contrast. CONTRAST:  16mL OMNIPAQUE IOHEXOL 300 MG/ML  SOLN COMPARISON:  Abdominal CT 02/22/2020 FINDINGS: Lower chest: Subsegmental linear atelectasis in the right lower lobe. Heart is normal in size. There are coronary artery calcifications. Fluid distends the distal esophagus without wall thickening. Hepatobiliary: Borderline hepatic steatosis without focal lesion. Gallstone without pericholecystic inflammation. No biliary dilatation. Pancreas: No ductal dilatation or inflammation. Spleen: Slightly heterogeneous but no evidence of focal lesion. Normal in size. Adrenals/Urinary Tract: No adrenal nodule. No hydronephrosis. Chronic fullness of the right renal pelvis. There is symmetric renal excretion on delayed phase imaging. Cortical cyst in the right kidney, as well as bilateral low-density lesions too small to accurately characterize. Mild cortical scarring in the lower left kidney unchanged. Suprapubic tube  decompresses the urinary bladder. Diffuse bladder wall thickening which is stable from prior. Stomach/Bowel: Fluid distends the distal esophagus. Distended fluid-filled stomach. Proximal small bowel are dilated and fluid-filled. Transition from dilated to nondilated small bowel in the right abdomen, series 5, image 37, series 2, image 49. The more distal small bowel is decompressed. There is no small bowel pneumatosis. Minimal mesenteric edema in the right abdomen with trace free fluid. Appendix not definitively visualized. The ascending colon is decompressed. Moderate  volume of stool in the transverse, descending, and sigmoid colon. Sigmoid diverticulosis without focal diverticulitis. Possible invasion of the anterior rectal wall by prostatic mass, series 2, image 89, also seen on prior exam. Vascular/Lymphatic: Aorto bi-iliac atherosclerosis. No aortic aneurysm. The portal vein is patent. Mesenteric vessels are patent. No abdominopelvic adenopathy. Reproductive: Irregular low-density mass within the prostate, similar to prior measuring 5.6 x 4.6 cm, series 2, image 86. This likely invades the anterior wall of the rectum. Other: Trace free fluid in the right abdomen. No other ascites. No free air. No intra-abdominal abscess. Musculoskeletal: Scattered sclerotic lesions in the pelvis are unchanged. Scattered sclerotic lesions in the spine, also unchanged. No pathologic fracture. IMPRESSION: 1. Small-bowel obstruction with transition point in the right abdomen, likely due to adhesions. 2. Moderate stool in the transverse, descending, and sigmoid colon. Distal diverticulosis without diverticulitis. 3. Irregular low-density mass within the prostate, likely invades the anterior wall of the rectum, grossly stable over the last 3 weeks. 4. Suprapubic tube decompresses the urinary bladder. Diffuse bladder wall thickening is chronic and likely related to enlarged prostate gland. 5. Cholelithiasis without pericholecystic  inflammation. 6. Sclerotic lesions in the pelvis and spine are unchanged from prior. Aortic Atherosclerosis (ICD10-I70.0). Electronically Signed   By: Keith Rake M.D.   On: 03/16/2020 17:26   DG Chest Portable 1 View  Result Date: 03/16/2020 CLINICAL DATA:  Abdominal pain, nausea, generalized weakness and constipation, history metastatic prostate cancer with ongoing oral chemotherapy EXAM: PORTABLE CHEST 1 VIEW COMPARISON:  Portable exam 2000 hours compared to 1509 hours FINDINGS: Nasogastric tube extends into stomach. Normal heart size post CABG. Mediastinal contours and pulmonary vascularity normal. Atherosclerotic calcification aorta. Lungs clear. No pleural effusion or pneumothorax. Bones demineralized. IMPRESSION: No acute abnormalities. Electronically Signed   By: Lavonia Dana M.D.   On: 03/16/2020 20:08    My personal review of EKG: Rhythm NSR, no ST-T changes   Assessment & Plan:    Active Problems:   SBO (small bowel obstruction) ([place])   1. Small bowel obstruction-CT abdomen pelvis shows small bowel obstruction with transition point in the right side of the abdomen.  NG tube has been inserted in the ED.  We will keep him n.p.o.  Start Ringer lactate at 100 mL/h.  General surgery will be consulted in a.m.  Will obtain abdominal x-ray in a.m.  Continue morphine 2 mg IV every 4 hours as needed for pain 2. Paroxysmal atrial fibrillation-patient is on metoprolol for rate control, will start Lopressor 2.5 mg IV every 8 hours.  Patient is on Eliquis for anticoagulation.  Will hold Eliquis at this time. 3. Medical prostate cancer-patient receiving chemotherapy and radiation treatment as outpatient.  Stable at this time.  Hold Xtandi. 4. ?  Dementia with behavior disturbance-patient has now been diagnosed with dementia but has been having hallucinations at nighttime.  Likely sundowning.  We will keep a close watch and consider treating with Haldol as needed for  hallucinations. 5. Hypertension-hold Norvasc, started on metoprolol as above. 6. CAD-stable hold Imdur, Plavix.    DVT Prophylaxis-  SCDs  AM Labs Ordered, also please review Full Orders  Family Communication: Admission, patients condition and plan of care including tests being ordered have been discussed with the patient  who indicate understanding and agree with the plan and Code Status.  Code Status: Full code  Admission status: Inpatient :The appropriate admission status for this patient is INPATIENT. Inpatient status is judged to be reasonable and necessary in order to  provide the required intensity of service to ensure the patient's safety. The patient's presenting symptoms, physical exam findings, and initial radiographic and laboratory data in the context of their chronic comorbidities is felt to place them at high risk for further clinical deterioration. Furthermore, it is not anticipated that the patient will be medically stable for discharge from the hospital within 2 midnights of admission. The following factors support the admission status of inpatient.     The patient's presenting symptoms include abdominal pain. The worrisome physical exam findings include none. The initial radiographic and laboratory data are worrisome because of small bowel obstruction. The chronic co-morbidities include prostate cancer.       * I certify that at the point of admission it is my clinical judgment that the patient will require inpatient hospital care spanning beyond 2 midnights from the point of admission due to high intensity of service, high risk for further deterioration and high frequency of surveillance required.*  Time spent in minutes : 60 minutes   Donnel Venuto S Rexann Lueras M.D

## 2020-03-16 NOTE — ED Notes (Signed)
Pt returned from CT °

## 2020-03-16 NOTE — ED Provider Notes (Signed)
[facility] EMERGENCY DEPARTMENT Provider Note   CSN: [id number] Arrival date & time: 03/16/20  1410     History Chief Complaint  Patient presents with  . Abdominal Pain    Elijah Santana is a 86 y.o. 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  Patient reports that he is receiving chemotherapy and radiation with most recent treatment yesterday.  On my examination, patient informs me that he has been experiencing abdominal pain for "1 year", however, he recently began feeling particularly weak.  He endorses early satiety and discomfort with eating meals.  He has been continuing to drink water, however.  While he endorses nausea, he has not actually vomited.  When asked about chest discomfort or shortness of breath symptoms, he states "here and there".  Ambulates with a walker.  He is relatively poor historian.  I obtained history over the phone from Clydie Braun, his daughter, who states that he just finished 4 rounds of radiation over the course of the past 4 days.  Evidently the most recent CT demonstrated that the mass is unchanged, however shifted closer to the rectal wall which they suspect is causing his symptoms of abdominal pain.  When he received radiation in Fall of 2020, he responded very well, however this time he is becoming fatigued and weak.  Patient takes tramadol for his symptoms of pain, which she suspects may be a contributing factor.  His oncologist, Dr. Ellin Saba, reduced his tramadol from 100 mg every 6 hours to 75 mg every 6 hours due to his reported weakness.  He has been complaining of nausea symptoms to the caregivers as well and they note that he has  been dry heaving.  Today he requested that he come to the ED for evaluation in the setting of his weakness.      HPI     Past Medical History:  Diagnosis Date  . Arthritis   . Asthma    Childhood  . Chronic back pain   . Coronary atherosclerosis of native coronary artery    a. CABG 2004 with LIMA-LAD, SVG-D1, SVG-OM, SVG-PDA. b. Cath ~2010 with occ of SVG-diagonal, distal LAD and OM filiing by collaterals. c. NSTEMI 12/2014 s/p DES to SVG-RCA. d. 11/2015: DES to distal graft of the SVG-distal RCA  . Essential hypertension   . Family history of Santana cancer   . Family history of pancreatic cancer   . Foley catheter in place   . GERD (gastroesophageal reflux disease)   . Hard of hearing   . History of pneumonia   . Hyperglycemia   . Ischemic cardiomyopathy    a. EF 45% by cath 12/2014.  . Mixed hyperlipidemia   . NSTEMI (non-ST elevated myocardial infarction) (HCC) 01/01/15  . Paroxysmal atrial fibrillation (HCC)   . Prostate cancer [facility])    Radiation therapy 1996  . Skin cancer   . Stroke [facility])    TIA- 28 years ago     Patient Active Problem List   Diagnosis Date Noted  . Genetic testing 10/25/2019  . Family history of pancreatic cancer   . Family history of Santana cancer   . Rectal pain 08/08/2019  . Lower abdominal pain 08/08/2019  . Urinary  retention 03/24/2019  . Bilateral hydronephrosis 03/24/2019  . Uncontrolled hypertension 03/24/2019  . Acute lower UTI 03/24/2019  . Anemia 03/24/2019  . Bone metastasis (HCC) 04/08/2018  . Prostate cancer (HCC) 11/27/2017  . Atrial fibrillation, rapid -new 09/10/16 09/10/2016  . PAF- in setting of NSTEMI 09/10/2016  . Accelerating angina (HCC)   . CAD S/P percutaneous coronary angioplasty   . Coronary artery disease with hx of myocardial infarct w/o hx of CABG 03/21/2015  . Ischemic cardiomyopathy   . Hyperglycemia   . Essential hypertension   . BPH (benign prostatic hyperplasia)   . NSTEMI- Troponin peak 6/47 12/31/2014  .  S/P CABG x 4 2004 12/31/2014  . GERD (gastroesophageal reflux disease) 04/29/2012  . Mixed hyperlipidemia   . Essential hypertension, benign 08/06/2009    Past Surgical History:  Procedure Laterality Date  . CARDIAC CATHETERIZATION  01/01/2015   Procedure: CORONARY STENT INTERVENTION;  Surgeon: Peter M Swaziland, MD;  Location: [facility] CATH LAB;  Service: Cardiovascular;;  SVG to PDA  . CARDIAC CATHETERIZATION N/A 11/21/2015   Procedure: Left Heart Cath and Cors/Grafts Angiography;  Surgeon: Lennette Bihari, MD;  Location: [facility] INVASIVE CV LAB;  Service: Cardiovascular;  Laterality: N/A;  . CARDIAC CATHETERIZATION N/A 11/21/2015   Procedure: Coronary Stent Intervention;  Surgeon: Lennette Bihari, MD;  Location: [facility] INVASIVE CV LAB;  Service: Cardiovascular;  Laterality: N/A;  . CARDIAC CATHETERIZATION N/A 09/11/2016   Procedure: Left Heart Cath and Cors/Grafts Angiography;  Surgeon: Marykay Lex, MD;  Location: [facility] INVASIVE CV LAB;  Service: Cardiovascular;  Laterality: N/A;  . CARDIAC CATHETERIZATION N/A 09/11/2016   Procedure: Coronary Stent Intervention;  Surgeon: Marykay Lex, MD;  Location: [facility] INVASIVE CV LAB;  Service: Cardiovascular;  Laterality: N/A;  . CORONARY ARTERY BYPASS GRAFT  2004   LIMA to LAD, SVG to diagonal, SVG to circumflex, SVG to PDA  . CYSTOSCOPY WITH URETHRAL DILATATION N/A 03/28/2019   Procedure: CYSTOSCOPY WITH URETHRAL  DILATATION OF STRICTURE;  Surgeon: Bjorn Pippin, MD;  Location: [facility] ORS;  Service: Urology;  Laterality: N/A;  . Daniella Dewberry LIGHT LASER TURP (TRANSURETHRAL RESECTION OF PROSTATE N/A 06/04/2016   Procedure: Dhyana Bastone LIGHT LASER TURP (TRANSURETHRAL RESECTION OF PROSTATE;  Surgeon: Bjorn Pippin, MD;  Location: [facility] ORS;  Service: Urology;  Laterality: N/A;  . LEFT HEART CATHETERIZATION WITH CORONARY/GRAFT ANGIOGRAM N/A 01/01/2015   Procedure: LEFT HEART CATHETERIZATION WITH Isabel Caprice;  Surgeon: Peter M Swaziland, MD;  Location: [facility] CATH LAB;  Service: Cardiovascular;   Laterality: N/A;  . PERCUTANEOUS CORONARY STENT INTERVENTION (PCI-S)  11/20/2015   distal SVG  with DES       . TONSILLECTOMY         Family History  Problem Relation Age of Onset  . Hypertension Father   . Coronary artery disease Father   . Pancreatic cancer Mother 21  . Santana cancer Sister 22  . Santana cancer Nephew        dx age 68, d. 41s    Social History   Tobacco Use  . Smoking status: Former Smoker    Packs/day: 0.50    Years: 27.00    Pack years: 13.50    Types: Cigars    Start date: 07/28/1964    Quit date: 07/28/1986    Years since quitting: 33.6  . Smokeless tobacco: Former Neurosurgeon    Types: Chew    Quit date: 08/07/2010  . Tobacco comment: never chewed up over a pack/day  Substance Use Topics  . Alcohol  use: No    Alcohol/week: 0.0 standard drinks  . Drug use: No    Home Medications Prior to Admission medications   Medication Sig Start Date End Date Taking? Authorizing Provider  amLODipine (NORVASC) 5 MG tablet TAKE ONE TABLET BY MOUTH DAILY (MORNING) Patient taking differently: Take 5 mg by mouth every morning.  11/22/17  Yes Jonelle Sidle, MD  calcium carbonate (OS-CAL - DOSED IN MG OF ELEMENTAL CALCIUM) 1250 (500 Ca) MG tablet Take 1 tablet by mouth daily.    Yes [provider]  clopidogrel (PLAVIX) 75 MG tablet TAKE ONE TABLET BY MOUTH EVERY DAY (,EVENING) Patient taking differently: Take 75 mg by mouth daily.  02/05/20  Yes Jonelle Sidle, MD  denosumab (XGEVA) 120 MG/1.7ML SOLN injection Inject 120 mg into the skin once.    Yes [provider]  ELIQUIS 5 MG TABS tablet TAKE ONE TABLET BY MOUTH TWICE DAILY. (MORNING ,EVENING) Patient taking differently: Take 5 mg by mouth 2 (two) times daily.  12/11/19  Yes Jonelle Sidle, MD  HYDROcodone-acetaminophen (NORCO) 5-325 MG tablet Take 1 tablet by mouth at bedtime as needed for moderate pain. 02/26/20  Yes Doreatha Massed, MD  isosorbide mononitrate (IMDUR) 60 MG 24 hr tablet  TAKE ONE TABLET BY MOUTH DAILY (MORNING) Patient taking differently: Take 60 mg by mouth every morning.  06/23/19  Yes Jonelle Sidle, MD  Leuprolide Acetate (LUPRON IJ) Inject as directed every 4 (four) months.    Yes [provider]  linaclotide Karlene Einstein) 290 MCG CAPS capsule Take 1 capsule (290 mcg total) by mouth daily before breakfast. 11/15/19  Yes Tawni Pummel A, PA-C  metoprolol tartrate (LOPRESSOR) 50 MG tablet Take 1 tablet (50 mg total) by mouth 2 (two) times daily. 06/23/19  Yes Jonelle Sidle, MD  pantoprazole (PROTONIX) 40 MG tablet Take 40 mg by mouth every morning. 09/19/19  Yes [provider]  rosuvastatin (CRESTOR) 20 MG tablet Take 1 tablet (20 mg total) by mouth every evening. 06/23/19  Yes Jonelle Sidle, MD  STOOL SOFTENER 100 MG capsule Take 100-200 mg by mouth daily as needed for mild constipation.  01/02/19  Yes [provider]  TOVIAZ 4 MG TB24 tablet Take 4 mg by mouth daily. 10/02/19  Yes [provider]  traMADol (ULTRAM) 50 MG tablet Take 2 tablets (100 mg total) by mouth every 6 (six) hours as needed (for pain). 03/05/20  Yes Doreatha Massed, MD  XTANDI 40 MG capsule TAKE 4 CAPSULES (160 MG TOTAL) BY MOUTH DAILY. Patient taking differently: Take 160 mg by mouth daily.  12/05/19  Yes Doreatha Massed, MD  acetaminophen (TYLENOL) 325 MG tablet Take 2 tablets (650 mg total) by mouth every 4 (four) hours as needed for headache or mild pain. Patient not taking: Reported on 01/22/2020 09/12/16   Abelino Derrick, PA-C  nitroGLYCERIN (NITROSTAT) 0.4 MG SL tablet DISSOLVE ONE TABLET UNDER TONGUE EVERY 5 MINUTES UP TO 3 DOSES AS NEEDED FOR CHEST PAIN Patient not taking: Reported on 02/26/2020 03/27/19   Jonelle Sidle, MD    Allergies    Patient has no known allergies.  Review of Systems   Review of Systems  All other systems reviewed and are negative.   Physical Exam Updated Vital Signs BP (!) 172/94   Pulse 72    Temp (!) 97.4 F (36.3 C) (Oral)   Resp 16   Ht 5\' 8"  (1.727 m)   SpO2 97%   BMI 26.33 kg/m  Physical Exam Vitals and nursing note reviewed. Exam conducted with a chaperone present.  Constitutional:      Appearance: Normal appearance.  HENT:     Head: Normocephalic and atraumatic.  Eyes:     General: No scleral icterus.    Conjunctiva/sclera: Conjunctivae normal.  Cardiovascular:     Rate and Rhythm: Normal rate and regular rhythm.     Pulses: Normal pulses.     Heart sounds: Normal heart sounds.  Pulmonary:     Effort: Pulmonary effort is normal. No respiratory distress.     Breath sounds: Normal breath sounds.  Abdominal:     Comments: Soft, nondistended.  TTP in periumbilical region.  No significant TTP elsewhere.  No guarding.  Suprapubic catheter placement.  No surrounding erythema or other overlying skin changes.  Hyperactive bowel sounds.  Musculoskeletal:     Cervical back: Normal range of motion. No rigidity.  Skin:    General: Skin is dry.     Capillary Refill: Capillary refill takes less than 2 seconds.  Neurological:     Mental Status: He is alert and oriented to person, place, and time.     GCS: GCS eye subscore is 4. GCS verbal subscore is 5. GCS motor subscore is 6.  Psychiatric:        Mood and Affect: Mood normal.        Behavior: Behavior normal.        Thought Content: Thought content normal.     ED Results / Procedures / Treatments   Labs (all labs ordered are listed, but only abnormal results are displayed) Labs Reviewed  COMPREHENSIVE METABOLIC PANEL - Abnormal; Notable for the following components:      Result Value   Glucose, Bld 180 (*)    AST 12 (*)    All other components within normal limits  CBC WITH DIFFERENTIAL/PLATELET - Abnormal; Notable for the following components:   WBC 11.4 (*)    Hemoglobin 12.7 (*)    MCH 25.0 (*)    MCHC 29.7 (*)    RDW 16.4 (*)    Platelets 408 (*)    Neutro Abs 10.4 (*)    All other components within  normal limits  RESPIRATORY PANEL BY RT PCR (FLU A&B, COVID)  LIPASE, BLOOD  TROPONIN I (HIGH SENSITIVITY)  TROPONIN I (HIGH SENSITIVITY)    EKG EKG Interpretation  Date/Time:  Saturday Mar 16 2020 14:58:48 EDT Ventricular Rate:  73 PR Interval:    QRS Duration: 119 QT Interval:  439 QTC Calculation: 484 R Axis:   3 Text Interpretation: Sinus rhythm Incomplete left bundle branch block Low voltage, precordial leads No STEMI Confirmed by Alona Bene [phone]) on 03/16/2020 3:13:58 PM   Radiology DG Chest 1 View  Result Date: 03/16/2020 CLINICAL DATA:  Weakness and mild shortness of breath. ACS. EXAM: CHEST  1 VIEW COMPARISON:  09/10/2016 FINDINGS: Post median sternotomy and CABG. The heart is normal in size. Aortic atherosclerosis and tortuosity, stable from prior exam. No pulmonary edema, focal airspace disease, pleural effusion or pneumothorax. No acute osseous abnormalities are seen. IMPRESSION: 1. No acute abnormality. 2. Post CABG with normal heart size. Aortic Atherosclerosis (ICD10-I70.0). Electronically Signed   By: Narda Rutherford M.D.   On: 03/16/2020 15:40   CT ABDOMEN PELVIS W CONTRAST  Result Date: 03/16/2020 CLINICAL DATA:  Acute generalized abdominal pain. Nausea. Weakness. Patient reports constipation. Active chemotherapy. History of prostate cancer. EXAM: CT ABDOMEN AND PELVIS WITH CONTRAST TECHNIQUE: Multidetector CT imaging of the  abdomen and pelvis was performed using the standard protocol following bolus administration of intravenous contrast. CONTRAST:  OMNIPAQUE IOHEXOL 300 MG/ML  SOLN COMPARISON:  Abdominal CT 02/22/2020 FINDINGS: Lower chest: Subsegmental linear atelectasis in the right lower lobe. Heart is normal in size. There are coronary artery calcifications. Fluid distends the distal esophagus without wall thickening. Hepatobiliary: Borderline hepatic steatosis without focal lesion. Gallstone without pericholecystic inflammation. No biliary dilatation.  Pancreas: No ductal dilatation or inflammation. Spleen: Slightly heterogeneous but no evidence of focal lesion. Normal in size. Adrenals/Urinary Tract: No adrenal nodule. No hydronephrosis. Chronic fullness of the right renal pelvis. There is symmetric renal excretion on delayed phase imaging. Cortical cyst in the right kidney, as well as bilateral low-density lesions too small to accurately characterize. Mild cortical scarring in the lower left kidney unchanged. Suprapubic tube decompresses the urinary bladder. Diffuse bladder wall thickening which is stable from prior. Stomach/Bowel: Fluid distends the distal esophagus. Distended fluid-filled stomach. Proximal small bowel are dilated and fluid-filled. Transition from dilated to nondilated small bowel in the right abdomen, series 5, image 37, series 2, image 49. The more distal small bowel is decompressed. There is no small bowel pneumatosis. Minimal mesenteric edema in the right abdomen with trace free fluid. Appendix not definitively visualized. The ascending colon is decompressed. Moderate volume of stool in the transverse, descending, and sigmoid colon. Sigmoid diverticulosis without focal diverticulitis. Possible invasion of the anterior rectal wall by prostatic mass, series 2, image 89, also seen on prior exam. Vascular/Lymphatic: Aorto bi-iliac atherosclerosis. No aortic aneurysm. The portal vein is patent. Mesenteric vessels are patent. No abdominopelvic adenopathy. Reproductive: Irregular low-density mass within the prostate, similar to prior measuring 5.6 x 4.6 cm, series 2, image 86. This likely invades the anterior wall of the rectum. Other: Trace free fluid in the right abdomen. No other ascites. No free air. No intra-abdominal abscess. Musculoskeletal: Scattered sclerotic lesions in the pelvis are unchanged. Scattered sclerotic lesions in the spine, also unchanged. No pathologic fracture. IMPRESSION: 1. Small-bowel obstruction with transition point in  the right abdomen, likely due to adhesions. 2. Moderate stool in the transverse, descending, and sigmoid colon. Distal diverticulosis without diverticulitis. 3. Irregular low-density mass within the prostate, likely invades the anterior wall of the rectum, grossly stable over the last 3 weeks. 4. Suprapubic tube decompresses the urinary bladder. Diffuse bladder wall thickening is chronic and likely related to enlarged prostate gland. 5. Cholelithiasis without pericholecystic inflammation. 6. Sclerotic lesions in the pelvis and spine are unchanged from prior. Aortic Atherosclerosis (ICD10-I70.0). Electronically Signed   By: Narda Rutherford M.D.   On: 03/16/2020 17:26    Procedures Procedures (including critical care time)  Medications Ordered in ED Medications  iohexol (OMNIPAQUE) 300 MG/ML solution 100 mL (100 mLs Intravenous Contrast Given 03/16/20 1639)  sodium chloride 0.9 % bolus 1,000 mL (1,000 mLs Intravenous New Bag/Given 03/16/20 1838)    ED Course  I have reviewed the triage vital signs and the nursing notes.  Pertinent labs & imaging results that were available during my care of the patient were reviewed by me and considered in my medical decision making (see chart for details).  Clinical Course as of Mar 16 1925  Sat Mar 16, 2020  4782 Spoke with Dr. Lovell Sheehan who is recommending placement of a 36 French NG tube.  He will see patient in the morning.  Recommending admission to hospitalist services.  Hold Plavix and Eliquis.  Remain n.p.o.   [GG]  1925 Spoke with hospitalist who  will admit patient.   [GG]    Clinical Course User Index [GG] Lorelee New, PA-C   MDM Rules/Calculators/A&P                      CT abdomen pelvis reveals a small bowel obstruction with transition point in the right side of the abdomen, likely due to adhesions.  This explains why patient was having early satiety, nausea, dry heaving, and abdominal pain symptoms.  Patient also has a mild leukocytosis of  11.4, but otherwise his laboratory work-up is unremarkable.  His daughter is now at bedside.  On reexamination, patient is sleeping comfortably.  Will keep n.p.o.  We will consult general surgery before ultimately admitted to the hospitalist.   Spoke with Dr. Lovell Sheehan who is recommending placement of a 28 French NG tube.  He will see patient in the morning.  Recommending admission to hospitalist services.  Hold Plavix and Eliquis.  Remain n.p.o.  Spoke with hospitalist who will admit patient.  Final Clinical Impression(s) / ED Diagnoses Final diagnoses:  Small bowel obstruction due to adhesions [facility])    Rx / DC Orders ED Discharge Orders    None       Lorelee New, PA-C 03/16/20 1926    Vanetta Mulders, MD 03/17/20 [phone]

## 2020-03-16 NOTE — ED Triage Notes (Signed)
Pt with abd pain and nausea, general weakness and c/o constipation.  Pt is receiving chemo and radiation at [facility] and last tx was yesterday.

## 2020-03-16 NOTE — ED Notes (Signed)
Pt to CT

## 2020-03-17 ENCOUNTER — Inpatient Hospital Stay (HOSPITAL_COMMUNITY): Payer: Medicare HMO

## 2020-03-17 DIAGNOSIS — K565 Intestinal adhesions [bands], unspecified as to partial versus complete obstruction: Secondary | ICD-10-CM

## 2020-03-17 DIAGNOSIS — I480 Paroxysmal atrial fibrillation: Secondary | ICD-10-CM | POA: Diagnosis present

## 2020-03-17 DIAGNOSIS — Z9359 Other cystostomy status: Secondary | ICD-10-CM

## 2020-03-17 DIAGNOSIS — Z9861 Coronary angioplasty status: Secondary | ICD-10-CM

## 2020-03-17 DIAGNOSIS — C61 Malignant neoplasm of prostate: Secondary | ICD-10-CM

## 2020-03-17 DIAGNOSIS — Z951 Presence of aortocoronary bypass graft: Secondary | ICD-10-CM

## 2020-03-17 DIAGNOSIS — I2510 Atherosclerotic heart disease of native coronary artery without angina pectoris: Secondary | ICD-10-CM

## 2020-03-17 DIAGNOSIS — I10 Essential (primary) hypertension: Secondary | ICD-10-CM

## 2020-03-17 DIAGNOSIS — I252 Old myocardial infarction: Secondary | ICD-10-CM

## 2020-03-17 DIAGNOSIS — K219 Gastro-esophageal reflux disease without esophagitis: Secondary | ICD-10-CM

## 2020-03-17 LAB — COMPREHENSIVE METABOLIC PANEL
ALT: 9 U/L (ref 0–44)
AST: 11 U/L — ABNORMAL LOW (ref 15–41)
Albumin: 3.3 g/dL — ABNORMAL LOW (ref 3.5–5.0)
Alkaline Phosphatase: 51 U/L (ref 38–126)
Anion gap: 11 (ref 5–15)
BUN: 17 mg/dL (ref 8–23)
CO2: 24 mmol/L (ref 22–32)
Calcium: 8.8 mg/dL — ABNORMAL LOW (ref 8.9–10.3)
Chloride: 103 mmol/L (ref 98–111)
Creatinine, Ser: 0.49 mg/dL — ABNORMAL LOW (ref 0.61–1.24)
GFR calc Af Amer: >60 mL/min (ref >60)
GFR calc non Af Amer: >60 mL/min (ref >60)
Glucose, Bld: 143 mg/dL — ABNORMAL HIGH (ref 70–99)
Potassium: 3.6 mmol/L (ref 3.5–5.1)
Sodium: 138 mmol/L (ref 135–145)
Total Bilirubin: 0.5 mg/dL (ref 0.3–1.2)
Total Protein: 7 g/dL (ref 6.5–8.1)

## 2020-03-17 LAB — CBC
HCT: 40.7 % (ref 39.0–52.0)
Hemoglobin: 12.3 g/dL — ABNORMAL LOW (ref 13.0–17.0)
MCH: 25.3 pg — ABNORMAL LOW (ref 26.0–34.0)
MCHC: 30.2 g/dL (ref 30.0–36.0)
MCV: 83.7 fL (ref 80.0–100.0)
Platelets: 429 10*3/uL — ABNORMAL HIGH (ref 150–400)
RBC: 4.86 MIL/uL (ref 4.22–5.81)
RDW: 16.6 % — ABNORMAL HIGH (ref 11.5–15.5)
WBC: 14 10*3/uL — ABNORMAL HIGH (ref 4.0–10.5)
nRBC: 0 % (ref 0.0–0.2)

## 2020-03-17 MED ORDER — ROSUVASTATIN CALCIUM 20 MG PO TABS
20.0000 mg | ORAL_TABLET | Freq: Every evening | ORAL | Status: DC
  Administered 2020-03-18 – 2020-03-21 (×4): 20 mg
  Filled 2020-03-17 (×4): qty 1

## 2020-03-17 MED ORDER — HALOPERIDOL LACTATE 5 MG/ML IJ SOLN
2.0000 mg | Freq: Four times a day (QID) | INTRAMUSCULAR | Status: DC | PRN
  Administered 2020-03-17 – 2020-03-18 (×2): 2 mg via INTRAVENOUS
  Filled 2020-03-17 (×3): qty 1

## 2020-03-17 MED ORDER — PANTOPRAZOLE SODIUM 40 MG IV SOLR
40.0000 mg | INTRAVENOUS | Status: DC
  Administered 2020-03-17 – 2020-03-22 (×6): 40 mg via INTRAVENOUS
  Filled 2020-03-17 (×6): qty 40

## 2020-03-17 MED ORDER — METOPROLOL TARTRATE 5 MG/5ML IV SOLN
5.0000 mg | Freq: Four times a day (QID) | INTRAVENOUS | Status: DC
  Administered 2020-03-17 – 2020-03-18 (×5): 5 mg via INTRAVENOUS
  Filled 2020-03-17 (×5): qty 5

## 2020-03-17 MED ORDER — MILK AND MOLASSES ENEMA
1.0000 | Freq: Once | RECTAL | Status: AC
  Administered 2020-03-17: 240 mL via RECTAL

## 2020-03-17 MED ORDER — ISOSORBIDE MONONITRATE ER 60 MG PO TB24
60.0000 mg | ORAL_TABLET | Freq: Every morning | ORAL | Status: DC
  Administered 2020-03-18 – 2020-03-22 (×5): 60 mg via ORAL
  Filled 2020-03-17 (×6): qty 1

## 2020-03-17 MED ORDER — AMLODIPINE BESYLATE 5 MG PO TABS
5.0000 mg | ORAL_TABLET | Freq: Every morning | ORAL | Status: DC
  Administered 2020-03-18 – 2020-03-22 (×5): 5 mg
  Filled 2020-03-17 (×6): qty 1

## 2020-03-17 MED ORDER — HYDRALAZINE HCL 20 MG/ML IJ SOLN
10.0000 mg | INTRAMUSCULAR | Status: DC | PRN
  Administered 2020-03-17: 10 mg via INTRAVENOUS
  Filled 2020-03-17: qty 1

## 2020-03-17 NOTE — Consult Note (Signed)
Reason for Consult: Small bowel obstruction Referring Physician: Dr. Pat Kocher Elijah Santana is an 86 y.o. male.  HPI: Patient is an 86 year old white male with a history of metastatic prostate cancer, hypertension, CVA, atrial fibrillation on Eliquis, and possible dementia who presents with nausea, vomiting, constipation.  He has been dealing with constipation due to impingement of the prostate cancer on the rectum.  He has been receiving chemotherapy and radiation treatment with his last episode 2 days ago.  Patient is a variable historian.  CT scan of the abdomen and pelvis reveals a small bowel obstruction with a transition zone in the right abdomen with collapsed small bowel distally.  His colon does have stool present.  He had a distended stomach and fluid in the esophagus.  An NG tube was placed.  He was admitted to the hospital for further evaluation and treatment.  Past Medical History:  Diagnosis Date  . Arthritis   . Asthma    Childhood  . Chronic back pain   . Coronary atherosclerosis of native coronary artery    a. CABG 2004 with LIMA-LAD, SVG-D1, SVG-OM, SVG-PDA. b. Cath ~2010 with occ of SVG-diagonal, distal LAD and OM filiing by collaterals. c. NSTEMI 12/2014 s/p DES to SVG-RCA. d. 11/2015: DES to distal graft of the SVG-distal RCA  . Essential hypertension   . Family history of brain cancer   . Family history of pancreatic cancer   . Foley catheter in place   . GERD (gastroesophageal reflux disease)   . Hard of hearing   . History of pneumonia   . Hyperglycemia   . Ischemic cardiomyopathy    a. EF 45% by cath 12/2014.  . Mixed hyperlipidemia   . NSTEMI (non-ST elevated myocardial infarction) ([place]) 01/01/15  . Paroxysmal atrial fibrillation (HCC)   . Prostate cancer [facility])    Radiation therapy 1996  . Skin cancer   . Stroke [facility])    TIA- 28 years ago     Past Surgical History:  Procedure Laterality Date  . CARDIAC CATHETERIZATION  01/01/2015   Procedure: CORONARY STENT  INTERVENTION;  Surgeon: Peter M Martinique, MD;  Location: [facility] CATH LAB;  Service: Cardiovascular;;  SVG to PDA  . CARDIAC CATHETERIZATION N/A 11/21/2015   Procedure: Left Heart Cath and Cors/Grafts Angiography;  Surgeon: Troy Sine, MD;  Location: [place] CV LAB;  Service: Cardiovascular;  Laterality: N/A;  . CARDIAC CATHETERIZATION N/A 11/21/2015   Procedure: Coronary Stent Intervention;  Surgeon: Troy Sine, MD;  Location: [place] CV LAB;  Service: Cardiovascular;  Laterality: N/A;  . CARDIAC CATHETERIZATION N/A 09/11/2016   Procedure: Left Heart Cath and Cors/Grafts Angiography;  Surgeon: Leonie Man, MD;  Location: [place] CV LAB;  Service: Cardiovascular;  Laterality: N/A;  . CARDIAC CATHETERIZATION N/A 09/11/2016   Procedure: Coronary Stent Intervention;  Surgeon: Leonie Man, MD;  Location: [place] CV LAB;  Service: Cardiovascular;  Laterality: N/A;  . CORONARY ARTERY BYPASS GRAFT  2004   LIMA to LAD, SVG to diagonal, SVG to circumflex, SVG to PDA  . CYSTOSCOPY WITH URETHRAL DILATATION N/A 03/28/2019   Procedure: CYSTOSCOPY WITH URETHRAL  DILATATION OF STRICTURE;  Surgeon: Irine Seal, MD;  Location: [facility] ORS;  Service: Urology;  Laterality: N/A;  . GREEN LIGHT LASER TURP (TRANSURETHRAL RESECTION OF PROSTATE N/A 06/04/2016   Procedure: GREEN LIGHT LASER TURP (TRANSURETHRAL RESECTION OF PROSTATE;  Surgeon: Irine Seal, MD;  Location: [facility] ORS;  Service: Urology;  Laterality: N/A;  . LEFT  HEART CATHETERIZATION WITH CORONARY/GRAFT ANGIOGRAM N/A 01/01/2015   Procedure: LEFT HEART CATHETERIZATION WITH Beatrix Fetters;  Surgeon: Peter M Martinique, MD;  Location: [facility] CATH LAB;  Service: Cardiovascular;  Laterality: N/A;  . PERCUTANEOUS CORONARY STENT INTERVENTION (PCI-S)  11/20/2015   distal SVG  with DES       . TONSILLECTOMY      Family History  Problem Relation Age of Onset  . Hypertension Father   . Coronary artery disease Father   . Pancreatic cancer Mother 20  .  Brain cancer Sister 21  . Brain cancer Nephew        dx age 40, d. 54s    Social History:  reports that he quit smoking about 33 years ago. His smoking use included cigars. He started smoking about 55 years ago. He has a 13.50 pack-year smoking history. He quit smokeless tobacco use about 9 years ago.  His smokeless tobacco use included chew. He reports that he does not drink alcohol or use drugs.  Allergies: No Known Allergies  Medications:  Scheduled: . amLODipine  5 mg Per Tube q morning - 10a  . isosorbide mononitrate  60 mg Oral q morning - 10a  . metoprolol tartrate  5 mg Intravenous Q6H  . milk and molasses  1 enema Rectal Once  . rosuvastatin  20 mg Per Tube QPM    Results for orders placed or performed during the hospital encounter of 03/16/20 (from the past 48 hour(s))  Troponin I (High Sensitivity)     Status: None   Collection Time: 03/16/20  2:45 PM  Result Value Ref Range   Troponin I (High Sensitivity) 3 <18 ng/L    Comment: (NOTE) Elevated high sensitivity troponin I (hsTnI) values and significant  changes across serial measurements may suggest ACS but many other  chronic and acute conditions are known to elevate hsTnI results.  Refer to the "Links" section for chest pain algorithms and additional  guidance. Performed at [facility], [address] [place], [place] [address]   Comprehensive metabolic panel     Status: Abnormal   Collection Time: 03/16/20  2:45 PM  Result Value Ref Range   Sodium 135 135 - 145 mmol/L   Potassium 3.7 3.5 - 5.1 mmol/L   Chloride 100 98 - 111 mmol/L   CO2 22 22 - 32 mmol/L   Glucose, Bld 180 (H) 70 - 99 mg/dL    Comment: Glucose reference range applies only to samples taken after fasting for at least 8 hours.   BUN 19 8 - 23 mg/dL   Creatinine, Ser 0.61 0.61 - 1.24 mg/dL   Calcium 9.1 8.9 - 10.3 mg/dL   Total Protein 8.0 6.5 - 8.1 g/dL   Albumin 3.5 3.5 - 5.0 g/dL   AST 12 (L) 15 - 41 U/L   ALT 10 0 - 44 U/L   Alkaline  Phosphatase 55 38 - 126 U/L   Total Bilirubin 0.6 0.3 - 1.2 mg/dL   GFR calc non Af Amer >60 >60 mL/min   GFR calc Af Amer >60 >60 mL/min   Anion gap 13 5 - 15    Comment: Performed at [facility], [address] [place], [place] [address]  Lipase, blood     Status: None   Collection Time: 03/16/20  2:45 PM  Result Value Ref Range   Lipase 17 11 - 51 U/L    Comment: Performed at [facility], [address] [place], [place] [address]  CBC with  Differential     Status: Abnormal   Collection Time: 03/16/20  2:45 PM  Result Value Ref Range   WBC 11.4 (H) 4.0 - 10.5 K/uL   RBC 5.07 4.22 - 5.81 MIL/uL   Hemoglobin 12.7 (L) 13.0 - 17.0 g/dL   HCT 42.8 39.0 - 52.0 %   MCV 84.4 80.0 - 100.0 fL   MCH 25.0 (L) 26.0 - 34.0 pg   MCHC 29.7 (L) 30.0 - 36.0 g/dL   RDW 16.4 (H) 11.5 - 15.5 %   Platelets 408 (H) 150 - 400 K/uL   nRBC 0.0 0.0 - 0.2 %   Neutrophils Relative % 91 %   Neutro Abs 10.4 (H) 1.7 - 7.7 K/uL   Lymphocytes Relative 6 %   Lymphs Abs 0.7 0.7 - 4.0 K/uL   Monocytes Relative 3 %   Monocytes Absolute 0.3 0.1 - 1.0 K/uL   Eosinophils Relative 0 %   Eosinophils Absolute 0.0 0.0 - 0.5 K/uL   Basophils Relative 0 %   Basophils Absolute 0.0 0.0 - 0.1 K/uL   Immature Granulocytes 0 %   Abs Immature Granulocytes 0.05 0.00 - 0.07 K/uL    Comment: Performed at [facility], [address] [place], [place] [address]  Troponin I (High Sensitivity)     Status: None   Collection Time: 03/16/20  4:28 PM  Result Value Ref Range   Troponin I (High Sensitivity) 4 <18 ng/L    Comment: (NOTE) Elevated high sensitivity troponin I (hsTnI) values and significant  changes across serial measurements may suggest ACS but many other  chronic and acute conditions are known to elevate hsTnI results.  Refer to the "Links" section for chest pain algorithms and additional  guidance. Performed at [facility], [address], [place], [place] [address]   Respiratory Panel by RT PCR (Flu A&B, Covid)  - Nasopharyngeal Swab     Status: None   Collection Time: 03/16/20  7:21 PM   Specimen: Nasopharyngeal Swab  Result Value Ref Range   SARS Coronavirus 2 by RT PCR NEGATIVE NEGATIVE    Comment: (NOTE) SARS-CoV-2 target nucleic acids are NOT DETECTED. The SARS-CoV-2 RNA is generally detectable in upper respiratoy specimens during the acute phase of infection. The lowest concentration of SARS-CoV-2 viral copies this assay can detect is 131 copies/mL. A negative result does not preclude SARS-Cov-2 infection and should not be used as the sole basis for treatment or other patient management decisions. A negative result may occur with  improper specimen collection/handling, submission of specimen other than nasopharyngeal swab, presence of viral mutation(s) within the areas targeted by this assay, and inadequate number of viral copies (<131 copies/mL). A negative result must be combined with clinical observations, patient history, and epidemiological information. The expected result is Negative. Fact Sheet for Patients:  PinkCheek.be Fact Sheet for Healthcare Providers:  GravelBags.it This test is not yet ap proved or cleared by the [place] FDA and  has been authorized for detection and/or diagnosis of SARS-CoV-2 by FDA under an Emergency Use Authorization (EUA). This EUA will remain  in effect (meaning this test can be used) for the duration of the COVID-19 declaration under Section 564(b)(1) of the Act, 21 U.S.C. section 360bbb-3(b)(1), unless the authorization is terminated or revoked sooner.    Influenza A by PCR NEGATIVE NEGATIVE   Influenza B by PCR NEGATIVE NEGATIVE    Comment: (NOTE) The Xpert Xpress SARS-CoV-2/FLU/RSV assay is intended as an aid in  the diagnosis of influenza from  Nasopharyngeal swab specimens and  should not be used as a sole basis for treatment. Nasal washings and  aspirates are unacceptable for  Xpert Xpress SARS-CoV-2/FLU/RSV  testing. Fact Sheet for Patients: PinkCheek.be Fact Sheet for Healthcare Providers: GravelBags.it This test is not yet approved or cleared by the [place] FDA and  has been authorized for detection and/or diagnosis of SARS-CoV-2 by  FDA under an Emergency Use Authorization (EUA). This EUA will remain  in effect (meaning this test can be used) for the duration of the  Covid-19 declaration under Section 564(b)(1) of the Act, 21  U.S.C. section 360bbb-3(b)(1), unless the authorization is  terminated or revoked. Performed at [facility], [address] [place], [place] [address]   CBC     Status: Abnormal   Collection Time: 03/17/20  5:57 AM  Result Value Ref Range   WBC 14.0 (H) 4.0 - 10.5 K/uL   RBC 4.86 4.22 - 5.81 MIL/uL   Hemoglobin 12.3 (L) 13.0 - 17.0 g/dL   HCT 40.7 39.0 - 52.0 %   MCV 83.7 80.0 - 100.0 fL   MCH 25.3 (L) 26.0 - 34.0 pg   MCHC 30.2 30.0 - 36.0 g/dL   RDW 16.6 (H) 11.5 - 15.5 %   Platelets 429 (H) 150 - 400 K/uL   nRBC 0.0 0.0 - 0.2 %    Comment: Performed at [facility], [address], [place], [place] [address]  Comprehensive metabolic panel     Status: Abnormal   Collection Time: 03/17/20  5:57 AM  Result Value Ref Range   Sodium 138 135 - 145 mmol/L   Potassium 3.6 3.5 - 5.1 mmol/L   Chloride 103 98 - 111 mmol/L   CO2 24 22 - 32 mmol/L   Glucose, Bld 143 (H) 70 - 99 mg/dL    Comment: Glucose reference range applies only to samples taken after fasting for at least 8 hours.   BUN 17 8 - 23 mg/dL   Creatinine, Ser 0.49 (L) 0.61 - 1.24 mg/dL   Calcium 8.8 (L) 8.9 - 10.3 mg/dL   Total Protein 7.0 6.5 - 8.1 g/dL   Albumin 3.3 (L) 3.5 - 5.0 g/dL   AST 11 (L) 15 - 41 U/L   ALT 9 0 - 44 U/L   Alkaline Phosphatase 51 38 - 126 U/L   Total Bilirubin 0.5 0.3 - 1.2 mg/dL   GFR calc non Af Amer >60 >60 mL/min   GFR calc Af Amer >60 >60 mL/min   Anion gap 11 5 - 15     Comment: Performed at [facility], [address] [place],  [address]    DG Chest 1 View  Result Date: 03/16/2020 CLINICAL DATA:  Weakness and mild shortness of breath. ACS. EXAM: CHEST  1 VIEW COMPARISON:  09/10/2016 FINDINGS: Post median sternotomy and CABG. The heart is normal in size. Aortic atherosclerosis and tortuosity, stable from prior exam. No pulmonary edema, focal airspace disease, pleural effusion or pneumothorax. No acute osseous abnormalities are seen. IMPRESSION: 1. No acute abnormality. 2. Post CABG with normal heart size. Aortic Atherosclerosis (ICD10-I70.0). Electronically Signed   By: Keith Rake M.D.   On: 03/16/2020 15:40   CT ABDOMEN PELVIS W CONTRAST  Result Date: 03/16/2020 CLINICAL DATA:  Acute generalized abdominal pain. Nausea. Weakness. Patient reports constipation. Active chemotherapy. History of prostate cancer. EXAM: CT ABDOMEN AND PELVIS WITH CONTRAST TECHNIQUE: Multidetector CT imaging of the abdomen and pelvis was performed using the standard protocol following bolus  administration of intravenous contrast. CONTRAST:  161mL OMNIPAQUE IOHEXOL 300 MG/ML  SOLN COMPARISON:  Abdominal CT 02/22/2020 FINDINGS: Lower chest: Subsegmental linear atelectasis in the right lower lobe. Heart is normal in size. There are coronary artery calcifications. Fluid distends the distal esophagus without wall thickening. Hepatobiliary: Borderline hepatic steatosis without focal lesion. Gallstone without pericholecystic inflammation. No biliary dilatation. Pancreas: No ductal dilatation or inflammation. Spleen: Slightly heterogeneous but no evidence of focal lesion. Normal in size. Adrenals/Urinary Tract: No adrenal nodule. No hydronephrosis. Chronic fullness of the right renal pelvis. There is symmetric renal excretion on delayed phase imaging. Cortical cyst in the right kidney, as well as bilateral low-density lesions too small to accurately characterize. Mild cortical scarring in  the lower left kidney unchanged. Suprapubic tube decompresses the urinary bladder. Diffuse bladder wall thickening which is stable from prior. Stomach/Bowel: Fluid distends the distal esophagus. Distended fluid-filled stomach. Proximal small bowel are dilated and fluid-filled. Transition from dilated to nondilated small bowel in the right abdomen, series 5, image 37, series 2, image 49. The more distal small bowel is decompressed. There is no small bowel pneumatosis. Minimal mesenteric edema in the right abdomen with trace free fluid. Appendix not definitively visualized. The ascending colon is decompressed. Moderate volume of stool in the transverse, descending, and sigmoid colon. Sigmoid diverticulosis without focal diverticulitis. Possible invasion of the anterior rectal wall by prostatic mass, series 2, image 89, also seen on prior exam. Vascular/Lymphatic: Aorto bi-iliac atherosclerosis. No aortic aneurysm. The portal vein is patent. Mesenteric vessels are patent. No abdominopelvic adenopathy. Reproductive: Irregular low-density mass within the prostate, similar to prior measuring 5.6 x 4.6 cm, series 2, image 86. This likely invades the anterior wall of the rectum. Other: Trace free fluid in the right abdomen. No other ascites. No free air. No intra-abdominal abscess. Musculoskeletal: Scattered sclerotic lesions in the pelvis are unchanged. Scattered sclerotic lesions in the spine, also unchanged. No pathologic fracture. IMPRESSION: 1. Small-bowel obstruction with transition point in the right abdomen, likely due to adhesions. 2. Moderate stool in the transverse, descending, and sigmoid colon. Distal diverticulosis without diverticulitis. 3. Irregular low-density mass within the prostate, likely invades the anterior wall of the rectum, grossly stable over the last 3 weeks. 4. Suprapubic tube decompresses the urinary bladder. Diffuse bladder wall thickening is chronic and likely related to enlarged prostate  gland. 5. Cholelithiasis without pericholecystic inflammation. 6. Sclerotic lesions in the pelvis and spine are unchanged from prior. Aortic Atherosclerosis (ICD10-I70.0). Electronically Signed   By: Keith Rake M.D.   On: 03/16/2020 17:26   DG Chest Portable 1 View  Result Date: 03/16/2020 CLINICAL DATA:  Abdominal pain, nausea, generalized weakness and constipation, history metastatic prostate cancer with ongoing oral chemotherapy EXAM: PORTABLE CHEST 1 VIEW COMPARISON:  Portable exam 2000 hours compared to 1509 hours FINDINGS: Nasogastric tube extends into stomach. Normal heart size post CABG. Mediastinal contours and pulmonary vascularity normal. Atherosclerotic calcification aorta. Lungs clear. No pleural effusion or pneumothorax. Bones demineralized. IMPRESSION: No acute abnormalities. Electronically Signed   By: Lavonia Dana M.D.   On: 03/16/2020 20:08   DG Abd 2 Views  Result Date: 03/17/2020 CLINICAL DATA:  Prostate cancer, abdominal pain, small-bowel obstruction EXAM: ABDOMEN - 2 VIEW COMPARISON:  03/16/2020 CT abdomen/pelvis FINDINGS: Enteric tube terminates in the body of the stomach. No appreciable dilated gas-filled small bowel loops. Prominent colonic stool. No evidence of pneumatosis or pneumoperitoneum. No radiopaque nephrolithiasis. Intact lower sternotomy wires. IMPRESSION: Enteric tube terminates in the body of the stomach.  No appreciable dilated gas-filled small bowel loops. Prominent colonic stool. Electronically Signed   By: Ilona Sorrel M.D.   On: 03/17/2020 08:52    ROS:  Review of systems not obtained due to patient factors.  Blood pressure (!) 164/89, pulse 85, temperature 98.9 F (37.2 C), temperature source Oral, resp. rate 16, height 5\' 8"  (1.727 m), weight 70.4 kg, SpO2 94 %. Physical Exam: Pleasant and slightly confused white male no acute distress Head is normocephalic, atraumatic Lungs clear to auscultation with good breath sounds bilaterally Heart examination  reveals an irregularly irregular rate and rhythm. Abdomen is soft and not particularly distended.  Suprapubic catheter is in place.  No rigidity is noted.  Bowel sounds are present.  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  Hopefully, we can resolve his bowel obstruction with conservative management and NG tube decompression.  He does not appear to have had previous abdominal surgery.  Would continue to hold Plavix and Eliquis.  Will order molasses enema.  Will follow with you.  Aviva Signs 03/17/2020, 9:00 AM

## 2020-03-17 NOTE — Progress Notes (Signed)
Reported to Dr. Wynetta Emery that patient has been running NSR with occasional PVCs, but at around 1345 he had a 7 beat run of VT. Will continue to monitor

## 2020-03-17 NOTE — Plan of Care (Signed)
  Problem: Education: Goal: Knowledge of General Education information will improve Description Including pain rating scale, medication(s)/side effects and non-pharmacologic comfort measures Outcome: Progressing   Problem: Health Behavior/Discharge Planning: Goal: Ability to manage health-related needs will improve Outcome: Progressing   

## 2020-03-17 NOTE — Progress Notes (Signed)
PROGRESS NOTE   Elijah Santana  [id number] DOB: 05/28/1932 DOA: 03/16/2020 PCP: Glenda Chroman, MD   Chief Complaint  Patient presents with  . Abdominal Pain    Brief Narrative:  86 year old gentleman with metastatic prostate cancer currently undergoing chemotherapy and radiation with treatment yesterday, CAD, hypertension, cerebrovascular disease, paroxysmal A. fib on apixaban, GERD, chronic constipation presented to the emergency department with generalized weakness fatigue abdominal pain and work-up revealed small bowel obstruction with transition point in the right side of the abdominal wall.  NG tube was placed and surgery was consulted.  Assessment & Plan:   Active Problems:   GERD (gastroesophageal reflux disease)   S/P CABG x 4 2004   Ischemic cardiomyopathy   Essential hypertension   Coronary artery disease with hx of myocardial infarct w/o hx of CABG   CAD S/P percutaneous coronary angioplasty   Prostate cancer ([place])   Small bowel obstruction due to adhesions ([facility])   Paroxysmal atrial fibrillation ([place])   Suprapubic catheter ([place])   1. Small bowel obstruction-appreciate surgery consult and recommendations, continue supportive care as ordered, surgery team ordered a molasses enema.  Continue NG tube to low wall suction and supportive care.  Ice chips ordered by surgery today.  Gentle IV fluid hydration ordered.  I conferenced with Dr. Arnoldo Morale about this case earlier today.  2. GERD-IV Protonix ordered for GI protection. 3. Essential hypertension, poorly controlled-IV Lopressor 5 mg every 6 hours ordered and IV hydralazine ordered.  Unable to take p.o. medications at this time.  Did not want to stop NG suction at this time for 30 minutes to allow oral meds to be given through NG tube. 4. CAD-no chest pain symptoms at this time continue IV metoprolol as ordered. 5. Metastatic prostate cancer-patient reports that his diseases castration resistant and he has completed his final  chemo and radiation treatments.   DVT prophylaxis: SCD Code Status: DNR Family Communication: I spoke with his daughter Delene Ruffini who was at the bedside Disposition:   Status is: Inpatient  Remains inpatient appropriate because:Ongoing active pain requiring inpatient pain management, Unsafe d/c plan and IV treatments appropriate due to intensity of illness or inability to take PO  Dispo: The patient is from: Home              Anticipated d/c is to: Home              Anticipated d/c date is: 3 days              Patient currently is not medically stable to d/c.   Consultants:   Surgery   Procedures:   NG tube placement   Antimicrobials:    Subjective: Pt is agitated that he can't drink anything.  He complains of abdominal pain.  Objective: Vitals:   03/17/20 0559 03/17/20 1154 03/17/20 1155 03/17/20 1409  BP: (!) 164/89  (!) 184/99 (!) 178/93  Pulse: 85 86 85 79  Resp: 16 18 18 18   Temp: 98.9 F (37.2 C)  97.9 F (36.6 C) 98.6 F (37 C)  TempSrc: Oral  Oral Oral  SpO2: 94% 99% 98% 96%  Weight:      Height:        Intake/Output Summary (Last 24 hours) at 03/17/2020 1425 Last data filed at 03/17/2020 0600 Gross per 24 hour  Intake 1510.22 ml  Output 2825 ml  Net -1314.78 ml   Filed Weights   03/17/20 0032  Weight: 70.4 kg    Examination:  General exam: Pt is irritable.  NG in place with copious output.  NAD.  Respiratory system: Clear to auscultation. Respiratory effort normal. Cardiovascular system: normal S1 & S2 heard. No JVD, murmurs, rubs, gallops or clicks. No pedal edema. Gastrointestinal system: Abdomen is mildly distended, soft and diffusely tender to light palpation, hypoactive BS. No organomegaly or masses felt.  Central nervous system: Alert and oriented. No focal neurological deficits. Extremities: Symmetric 5 x 5 power. Skin: No rashes, lesions or ulcers Psychiatry: Judgement and insight appear normal. Mood & affect appropriate.   Data  Reviewed: I have personally reviewed following labs and imaging studies  CBC: Recent Labs  Lab 03/16/20 1445 03/17/20 0557  WBC 11.4* 14.0*  NEUTROABS 10.4*  --   HGB 12.7* 12.3*  HCT 42.8 40.7  MCV 84.4 83.7  PLT 408* 429*    Basic Metabolic Panel: Recent Labs  Lab 03/16/20 1445 03/17/20 0557  NA 135 138  K 3.7 3.6  CL 100 103  CO2 22 24  GLUCOSE 180* 143*  BUN 19 17  CREATININE 0.61 0.49*  CALCIUM 9.1 8.8*    GFR: Estimated Creatinine Clearance: 65.3 mL/min (A) (by C-G formula based on SCr of 0.49 mg/dL (L)).  Liver Function Tests: Recent Labs  Lab 03/16/20 1445 03/17/20 0557  AST 12* 11*  ALT 10 9  ALKPHOS 55 51  BILITOT 0.6 0.5  PROT 8.0 7.0  ALBUMIN 3.5 3.3*    CBG: No results for input(s): GLUCAP in the last 168 hours.   Recent Results (from the past 240 hour(s))  Respiratory Panel by RT PCR (Flu A&B, Covid) - Nasopharyngeal Swab     Status: None   Collection Time: 03/16/20  7:21 PM   Specimen: Nasopharyngeal Swab  Result Value Ref Range Status   SARS Coronavirus 2 by RT PCR NEGATIVE NEGATIVE Final    Comment: (NOTE) SARS-CoV-2 target nucleic acids are NOT DETECTED. The SARS-CoV-2 RNA is generally detectable in upper respiratoy specimens during the acute phase of infection. The lowest concentration of SARS-CoV-2 viral copies this assay can detect is 131 copies/mL. A negative result does not preclude SARS-Cov-2 infection and should not be used as the sole basis for treatment or other patient management decisions. A negative result may occur with  improper specimen collection/handling, submission of specimen other than nasopharyngeal swab, presence of viral mutation(s) within the areas targeted by this assay, and inadequate number of viral copies (<131 copies/mL). A negative result must be combined with clinical observations, patient history, and epidemiological information. The expected result is Negative. Fact Sheet for Patients:    PinkCheek.be Fact Sheet for Healthcare Providers:  GravelBags.it This test is not yet ap proved or cleared by the [place] FDA and  has been authorized for detection and/or diagnosis of SARS-CoV-2 by FDA under an Emergency Use Authorization (EUA). This EUA will remain  in effect (meaning this test can be used) for the duration of the COVID-19 declaration under Section 564(b)(1) of the Act, 21 U.S.C. section 360bbb-3(b)(1), unless the authorization is terminated or revoked sooner.    Influenza A by PCR NEGATIVE NEGATIVE Final   Influenza B by PCR NEGATIVE NEGATIVE Final    Comment: (NOTE) The Xpert Xpress SARS-CoV-2/FLU/RSV assay is intended as an aid in  the diagnosis of influenza from Nasopharyngeal swab specimens and  should not be used as a sole basis for treatment. Nasal washings and  aspirates are unacceptable for Xpert Xpress SARS-CoV-2/FLU/RSV  testing. Fact Sheet for Patients: PinkCheek.be Fact Sheet for  Healthcare Providers: GravelBags.it This test is not yet approved or cleared by the [place] and  has been authorized for detection and/or diagnosis of SARS-CoV-2 by  FDA under an Emergency Use Authorization (EUA). This EUA will remain  in effect (meaning this test can be used) for the duration of the  Covid-19 declaration under Section 564(b)(1) of the Act, 21  U.S.C. section 360bbb-3(b)(1), unless the authorization is  terminated or revoked. Performed at [facility], [address] [place], [place] [address]     Radiology Studies: DG Chest 1 View  Result Date: 03/16/2020 CLINICAL DATA:  Weakness and mild shortness of breath. ACS. EXAM: CHEST  1 VIEW COMPARISON:  09/10/2016 FINDINGS: Post median sternotomy and CABG. The heart is normal in size. Aortic atherosclerosis and tortuosity, stable from prior exam. No pulmonary edema, focal airspace  disease, pleural effusion or pneumothorax. No acute osseous abnormalities are seen. IMPRESSION: 1. No acute abnormality. 2. Post CABG with normal heart size. Aortic Atherosclerosis (ICD10-I70.0). Electronically Signed   By: Keith Rake M.D.   On: 03/16/2020 15:40   CT ABDOMEN PELVIS W CONTRAST  Result Date: 03/16/2020 CLINICAL DATA:  Acute generalized abdominal pain. Nausea. Weakness. Patient reports constipation. Active chemotherapy. History of prostate cancer. EXAM: CT ABDOMEN AND PELVIS WITH CONTRAST TECHNIQUE: Multidetector CT imaging of the abdomen and pelvis was performed using the standard protocol following bolus administration of intravenous contrast. CONTRAST:  188mL OMNIPAQUE IOHEXOL 300 MG/ML  SOLN COMPARISON:  Abdominal CT 02/22/2020 FINDINGS: Lower chest: Subsegmental linear atelectasis in the right lower lobe. Heart is normal in size. There are coronary artery calcifications. Fluid distends the distal esophagus without wall thickening. Hepatobiliary: Borderline hepatic steatosis without focal lesion. Gallstone without pericholecystic inflammation. No biliary dilatation. Pancreas: No ductal dilatation or inflammation. Spleen: Slightly heterogeneous but no evidence of focal lesion. Normal in size. Adrenals/Urinary Tract: No adrenal nodule. No hydronephrosis. Chronic fullness of the right renal pelvis. There is symmetric renal excretion on delayed phase imaging. Cortical cyst in the right kidney, as well as bilateral low-density lesions too small to accurately characterize. Mild cortical scarring in the lower left kidney unchanged. Suprapubic tube decompresses the urinary bladder. Diffuse bladder wall thickening which is stable from prior. Stomach/Bowel: Fluid distends the distal esophagus. Distended fluid-filled stomach. Proximal small bowel are dilated and fluid-filled. Transition from dilated to nondilated small bowel in the right abdomen, series 5, image 37, series 2, image 49. The more  distal small bowel is decompressed. There is no small bowel pneumatosis. Minimal mesenteric edema in the right abdomen with trace free fluid. Appendix not definitively visualized. The ascending colon is decompressed. Moderate volume of stool in the transverse, descending, and sigmoid colon. Sigmoid diverticulosis without focal diverticulitis. Possible invasion of the anterior rectal wall by prostatic mass, series 2, image 89, also seen on prior exam. Vascular/Lymphatic: Aorto bi-iliac atherosclerosis. No aortic aneurysm. The portal vein is patent. Mesenteric vessels are patent. No abdominopelvic adenopathy. Reproductive: Irregular low-density mass within the prostate, similar to prior measuring 5.6 x 4.6 cm, series 2, image 86. This likely invades the anterior wall of the rectum. Other: Trace free fluid in the right abdomen. No other ascites. No free air. No intra-abdominal abscess. Musculoskeletal: Scattered sclerotic lesions in the pelvis are unchanged. Scattered sclerotic lesions in the spine, also unchanged. No pathologic fracture. IMPRESSION: 1. Small-bowel obstruction with transition point in the right abdomen, likely due to adhesions. 2. Moderate stool in the transverse, descending, and sigmoid colon. Distal diverticulosis without diverticulitis. 3. Irregular  low-density mass within the prostate, likely invades the anterior wall of the rectum, grossly stable over the last 3 weeks. 4. Suprapubic tube decompresses the urinary bladder. Diffuse bladder wall thickening is chronic and likely related to enlarged prostate gland. 5. Cholelithiasis without pericholecystic inflammation. 6. Sclerotic lesions in the pelvis and spine are unchanged from prior. Aortic Atherosclerosis (ICD10-I70.0). Electronically Signed   By: Keith Rake M.D.   On: 03/16/2020 17:26   DG Chest Portable 1 View  Result Date: 03/16/2020 CLINICAL DATA:  Abdominal pain, nausea, generalized weakness and constipation, history metastatic  prostate cancer with ongoing oral chemotherapy EXAM: PORTABLE CHEST 1 VIEW COMPARISON:  Portable exam 2000 hours compared to 1509 hours FINDINGS: Nasogastric tube extends into stomach. Normal heart size post CABG. Mediastinal contours and pulmonary vascularity normal. Atherosclerotic calcification aorta. Lungs clear. No pleural effusion or pneumothorax. Bones demineralized. IMPRESSION: No acute abnormalities. Electronically Signed   By: Lavonia Dana M.D.   On: 03/16/2020 20:08   DG Abd 2 Views  Result Date: 03/17/2020 CLINICAL DATA:  Prostate cancer, abdominal pain, small-bowel obstruction EXAM: ABDOMEN - 2 VIEW COMPARISON:  03/16/2020 CT abdomen/pelvis FINDINGS: Enteric tube terminates in the body of the stomach. No appreciable dilated gas-filled small bowel loops. Prominent colonic stool. No evidence of pneumatosis or pneumoperitoneum. No radiopaque nephrolithiasis. Intact lower sternotomy wires. IMPRESSION: Enteric tube terminates in the body of the stomach. No appreciable dilated gas-filled small bowel loops. Prominent colonic stool. Electronically Signed   By: Ilona Sorrel M.D.   On: 03/17/2020 08:52   Scheduled Meds: . amLODipine  5 mg Per Tube q morning - 10a  . isosorbide mononitrate  60 mg Oral q morning - 10a  . metoprolol tartrate  5 mg Intravenous Q6H  . rosuvastatin  20 mg Per Tube QPM   Continuous Infusions: . lactated ringers 100 mL/hr at 03/17/20 0432     LOS: 1 day    Time spent: 26 mins  Kathryn Linarez Wynetta Emery, MD [facility]   To contact the attending provider between 7A-7P or the covering provider during after hours 7P-7A, please log into the web site [email or web address] and access using universal [place] password for that web site. If you do not have the password, please call the hospital operator.  03/17/2020, 2:25 PM

## 2020-03-18 ENCOUNTER — Telehealth (HOSPITAL_COMMUNITY): Payer: Self-pay | Admitting: *Deleted

## 2020-03-18 ENCOUNTER — Ambulatory Visit: Payer: Medicare HMO

## 2020-03-18 ENCOUNTER — Ambulatory Visit (HOSPITAL_COMMUNITY): Payer: Medicare HMO

## 2020-03-18 ENCOUNTER — Other Ambulatory Visit (HOSPITAL_COMMUNITY): Payer: Medicare HMO

## 2020-03-18 ENCOUNTER — Ambulatory Visit (HOSPITAL_COMMUNITY): Payer: Medicare HMO | Admitting: Hematology

## 2020-03-18 LAB — COMPREHENSIVE METABOLIC PANEL
ALT: 8 U/L (ref 0–44)
AST: 9 U/L — ABNORMAL LOW (ref 15–41)
Albumin: 2.9 g/dL — ABNORMAL LOW (ref 3.5–5.0)
Alkaline Phosphatase: 48 U/L (ref 38–126)
Anion gap: 11 (ref 5–15)
BUN: 24 mg/dL — ABNORMAL HIGH (ref 8–23)
CO2: 26 mmol/L (ref 22–32)
Calcium: 8.7 mg/dL — ABNORMAL LOW (ref 8.9–10.3)
Chloride: 102 mmol/L (ref 98–111)
Creatinine, Ser: 0.54 mg/dL — ABNORMAL LOW (ref 0.61–1.24)
GFR calc Af Amer: >60 mL/min (ref >60)
GFR calc non Af Amer: >60 mL/min (ref >60)
Glucose, Bld: 144 mg/dL — ABNORMAL HIGH (ref 70–99)
Potassium: 3.4 mmol/L — ABNORMAL LOW (ref 3.5–5.1)
Sodium: 139 mmol/L (ref 135–145)
Total Bilirubin: 0.8 mg/dL (ref 0.3–1.2)
Total Protein: 6.6 g/dL (ref 6.5–8.1)

## 2020-03-18 LAB — CBC
HCT: 41.5 % (ref 39.0–52.0)
Hemoglobin: 12.7 g/dL — ABNORMAL LOW (ref 13.0–17.0)
MCH: 25 pg — ABNORMAL LOW (ref 26.0–34.0)
MCHC: 30.6 g/dL (ref 30.0–36.0)
MCV: 81.5 fL (ref 80.0–100.0)
Platelets: 392 10*3/uL (ref 150–400)
RBC: 5.09 MIL/uL (ref 4.22–5.81)
RDW: 16.6 % — ABNORMAL HIGH (ref 11.5–15.5)
WBC: 11.9 10*3/uL — ABNORMAL HIGH (ref 4.0–10.5)
nRBC: 0 % (ref 0.0–0.2)

## 2020-03-18 MED ORDER — METOPROLOL TARTRATE 5 MG/5ML IV SOLN
7.5000 mg | Freq: Four times a day (QID) | INTRAVENOUS | Status: DC
  Administered 2020-03-18 – 2020-03-22 (×15): 7.5 mg via INTRAVENOUS
  Filled 2020-03-18 (×15): qty 10

## 2020-03-18 MED ORDER — POTASSIUM CHLORIDE 10 MEQ/100ML IV SOLN
10.0000 meq | INTRAVENOUS | Status: AC
  Administered 2020-03-18 (×2): 10 meq via INTRAVENOUS
  Filled 2020-03-18 (×2): qty 100

## 2020-03-18 NOTE — Progress Notes (Signed)
Patient ID: Elijah Santana, male   DOB: 1932/10/26, 86 y.o.   MRN: [id number]    Subjective: Elijah Santana was seen this morning and expressed that he was having abdominal pain in the RLQ and RUQ. He appeared somewhat uncomfortable but not acutely distressed. He did appear agitated and had mittens on due to pulling at NG tube.    Objective: Vital signs in last 24 hours: Temp:  [97.8 F (36.6 C)-98.6 F (37 C)] 98.5 F (36.9 C) (05/03 0531) Pulse Rate:  [79-101] 101 (05/03 0531) Resp:  [18] 18 (05/03 0531) BP: (139-184)/(78-99) 160/89 (05/03 0531) SpO2:  [95 %-99 %] 95 % (05/03 0531) Last BM Date: 03/17/20  Intake/Output from previous day: 05/02 0701 - 05/03 0700 In: 1070.3 [I.V.:1070.3] Out: 1650 [Urine:500; Emesis/NG output:1150] Intake/Output this shift: No intake/output data recorded.  Physical Exam Abdominal:     General: Bowel sounds are decreased. There is distension.     Palpations: Abdomen is soft.     Tenderness: There is generalized abdominal tenderness and tenderness in the right upper quadrant and right lower quadrant.  Skin:    General: Skin is warm and dry.    Lab Results:  Recent Labs    03/17/20 0557 03/18/20 0819  WBC 14.0* 11.9*  HGB 12.3* 12.7*  HCT 40.7 41.5  PLT 429* 392   BMET Recent Labs    03/17/20 0557 03/18/20 0819  NA 138 139  K 3.6 3.4*  CL 103 102  CO2 24 26  GLUCOSE 143* 144*  BUN 17 24*  CREATININE 0.49* 0.54*  CALCIUM 8.8* 8.7*    Studies/Results: DG Chest 1 View  Result Date: 03/16/2020 CLINICAL DATA:  Weakness and mild shortness of breath. ACS. EXAM: CHEST  1 VIEW COMPARISON:  09/10/2016 FINDINGS: Post median sternotomy and CABG. The heart is normal in size. Aortic atherosclerosis and tortuosity, stable from prior exam. No pulmonary edema, focal airspace disease, pleural effusion or pneumothorax. No acute osseous abnormalities are seen. IMPRESSION: 1. No acute abnormality. 2. Post CABG with normal heart size. Aortic  Atherosclerosis (ICD10-I70.0). Electronically Signed   By: Keith Rake M.D.   On: 03/16/2020 15:40   CT ABDOMEN PELVIS W CONTRAST  Result Date: 03/16/2020 CLINICAL DATA:  Acute generalized abdominal pain. Nausea. Weakness. Patient reports constipation. Active chemotherapy. History of prostate cancer. EXAM: CT ABDOMEN AND PELVIS WITH CONTRAST TECHNIQUE: Multidetector CT imaging of the abdomen and pelvis was performed using the standard protocol following bolus administration of intravenous contrast. CONTRAST:  174mL OMNIPAQUE IOHEXOL 300 MG/ML  SOLN COMPARISON:  Abdominal CT 02/22/2020 FINDINGS: Lower chest: Subsegmental linear atelectasis in the right lower lobe. Heart is normal in size. There are coronary artery calcifications. Fluid distends the distal esophagus without wall thickening. Hepatobiliary: Borderline hepatic steatosis without focal lesion. Gallstone without pericholecystic inflammation. No biliary dilatation. Pancreas: No ductal dilatation or inflammation. Spleen: Slightly heterogeneous but no evidence of focal lesion. Normal in size. Adrenals/Urinary Tract: No adrenal nodule. No hydronephrosis. Chronic fullness of the right renal pelvis. There is symmetric renal excretion on delayed phase imaging. Cortical cyst in the right kidney, as well as bilateral low-density lesions too small to accurately characterize. Mild cortical scarring in the lower left kidney unchanged. Suprapubic tube decompresses the urinary bladder. Diffuse bladder wall thickening which is stable from prior. Stomach/Bowel: Fluid distends the distal esophagus. Distended fluid-filled stomach. Proximal small bowel are dilated and fluid-filled. Transition from dilated to nondilated small bowel in the right abdomen, series 5, image 37, series 2, image 49.  The more distal small bowel is decompressed. There is no small bowel pneumatosis. Minimal mesenteric edema in the right abdomen with trace free fluid. Appendix not definitively  visualized. The ascending colon is decompressed. Moderate volume of stool in the transverse, descending, and sigmoid colon. Sigmoid diverticulosis without focal diverticulitis. Possible invasion of the anterior rectal wall by prostatic mass, series 2, image 89, also seen on prior exam. Vascular/Lymphatic: Aorto bi-iliac atherosclerosis. No aortic aneurysm. The portal vein is patent. Mesenteric vessels are patent. No abdominopelvic adenopathy. Reproductive: Irregular low-density mass within the prostate, similar to prior measuring 5.6 x 4.6 cm, series 2, image 86. This likely invades the anterior wall of the rectum. Other: Trace free fluid in the right abdomen. No other ascites. No free air. No intra-abdominal abscess. Musculoskeletal: Scattered sclerotic lesions in the pelvis are unchanged. Scattered sclerotic lesions in the spine, also unchanged. No pathologic fracture. IMPRESSION: 1. Small-bowel obstruction with transition point in the right abdomen, likely due to adhesions. 2. Moderate stool in the transverse, descending, and sigmoid colon. Distal diverticulosis without diverticulitis. 3. Irregular low-density mass within the prostate, likely invades the anterior wall of the rectum, grossly stable over the last 3 weeks. 4. Suprapubic tube decompresses the urinary bladder. Diffuse bladder wall thickening is chronic and likely related to enlarged prostate gland. 5. Cholelithiasis without pericholecystic inflammation. 6. Sclerotic lesions in the pelvis and spine are unchanged from prior. Aortic Atherosclerosis (ICD10-I70.0). Electronically Signed   By: Keith Rake M.D.   On: 03/16/2020 17:26   DG Chest Portable 1 View  Result Date: 03/16/2020 CLINICAL DATA:  Abdominal pain, nausea, generalized weakness and constipation, history metastatic prostate cancer with ongoing oral chemotherapy EXAM: PORTABLE CHEST 1 VIEW COMPARISON:  Portable exam 2000 hours compared to 1509 hours FINDINGS: Nasogastric tube extends  into stomach. Normal heart size post CABG. Mediastinal contours and pulmonary vascularity normal. Atherosclerotic calcification aorta. Lungs clear. No pleural effusion or pneumothorax. Bones demineralized. IMPRESSION: No acute abnormalities. Electronically Signed   By: Lavonia Dana M.D.   On: 03/16/2020 20:08   DG Abd 2 Views  Result Date: 03/17/2020 CLINICAL DATA:  Prostate cancer, abdominal pain, small-bowel obstruction EXAM: ABDOMEN - 2 VIEW COMPARISON:  03/16/2020 CT abdomen/pelvis FINDINGS: Enteric tube terminates in the body of the stomach. No appreciable dilated gas-filled small bowel loops. Prominent colonic stool. No evidence of pneumatosis or pneumoperitoneum. No radiopaque nephrolithiasis. Intact lower sternotomy wires. IMPRESSION: Enteric tube terminates in the body of the stomach. No appreciable dilated gas-filled small bowel loops. Prominent colonic stool. Electronically Signed   By: Ilona Sorrel M.D.   On: 03/17/2020 08:52    Assessment/Plan: Elijah Santana is a 86 year old male with metastatic prostate cancer admitted with a small bowel obstruction secondary to adhesive disease and metastatic prostate cancer. Leukocytosis is improving. Mild hypokalemia today; will monitor and replete as needed.  - continue NG tube decompression  - continue holding plavix and eliquis  - no surgical intervention at this time   LOS: 2 days    Bartholome Bill 03/18/2020

## 2020-03-18 NOTE — Progress Notes (Signed)
PROGRESS NOTE   Elijah Santana  [id number] DOB: [birth year] DOA: 03/16/2020 PCP: Glenda Chroman, MD   Chief Complaint  Patient presents with  . Abdominal Pain    Brief Narrative:  86 year old gentleman with metastatic prostate cancer currently undergoing chemotherapy and radiation with treatment yesterday, CAD, hypertension, cerebrovascular disease, paroxysmal A. fib on apixaban, GERD, chronic constipation presented to the emergency department with generalized weakness fatigue abdominal pain and work-up revealed small bowel obstruction with transition point in the right side of the abdominal wall.  NG tube was placed and surgery was consulted.  Assessment & Plan:   Active Problems:   GERD (gastroesophageal reflux disease)   S/P CABG x 4 2004   Ischemic cardiomyopathy   Essential hypertension   Coronary artery disease with hx of myocardial infarct w/o hx of CABG   CAD S/P percutaneous coronary angioplasty   Prostate cancer ([place])   Small bowel obstruction due to adhesions (HCC)   Paroxysmal atrial fibrillation ([place])   Suprapubic catheter ([place])   1. Small bowel obstruction-seems to be improving, had 3 bowel movements yesterday, still a lot of NG output.  Appreciate surgery consult and recommendations, continue supportive care as ordered. Continue NG tube to suction and supportive care. Gentle IV fluid hydration ordered.    2. GERD-IV Protonix ordered for GI protection. 3. Essential hypertension, poorly controlled-IV Lopressor 5 mg every 6 hours ordered and IV hydralazine ordered.  Unable to take p.o. medications at this time.  Did not want to stop NG suction at this time for 30 minutes to allow oral meds to be given through NG tube. 4. CAD-no chest pain symptoms at this time continue IV metoprolol as ordered. 5. Metastatic prostate cancer-patient reports that his diseases castration resistant and is actively still receiving chemo and radiation treatments.    DVT prophylaxis: SCD Code  Status: DNR Family Communication: I spoke with his daughter Delene Ruffini Disposition:   Status is: Inpatient  Remains inpatient appropriate because:Ongoing active pain requiring inpatient pain management, Unsafe d/c plan and IV treatments appropriate due to intensity of illness or inability to take PO  Dispo: The patient is from: Home              Anticipated d/c is to: Home              Anticipated d/c date is: 3 days              Patient currently is not medically stable to d/c.   Consultants:   Surgery   Procedures:   NG tube placement   Antimicrobials:    Subjective: Pt reports burning in abdomen.  Objective: Vitals:   03/17/20 1409 03/17/20 1713 03/17/20 2058 03/18/20 0531  BP: (!) 178/93 139/78 (!) 151/92 (!) 160/89  Pulse: 79 84 91 (!) 101  Resp: 18  18 18   Temp: 98.6 F (37 C)  97.8 F (36.6 C) 98.5 F (36.9 C)  TempSrc: Oral  Oral Oral  SpO2: 96%  96% 95%  Weight:      Height:        Intake/Output Summary (Last 24 hours) at 03/18/2020 1004 Last data filed at 03/18/2020 0900 Gross per 24 hour  Intake 1070.28 ml  Output 1650 ml  Net -579.72 ml   Filed Weights   03/17/20 0032  Weight: 70.4 kg    Examination:  General exam: Pt is irritable.  NG in place with copious output.  NAD.  Respiratory system: Clear to auscultation. Respiratory effort normal.  Cardiovascular system: normal S1 & S2 heard. No JVD, murmurs, rubs, gallops or clicks. No pedal edema. Gastrointestinal system: Abdomen is mildly distended, soft and diffusely tender to light palpation, hypoactive BS. No organomegaly or masses felt.  Central nervous system: Alert and oriented. No focal neurological deficits. Extremities: Symmetric 5 x 5 power. Skin: No rashes, lesions or ulcers Psychiatry: Judgement and insight appear normal. Mood & affect appropriate.   Data Reviewed: I have personally reviewed following labs and imaging studies  CBC: Recent Labs  Lab 03/16/20 1445 03/17/20 0557  03/18/20 0819  WBC 11.4* 14.0* 11.9*  NEUTROABS 10.4*  --   --   HGB 12.7* 12.3* 12.7*  HCT 42.8 40.7 41.5  MCV 84.4 83.7 81.5  PLT 408* 429* 0000000    Basic Metabolic Panel: Recent Labs  Lab 03/16/20 1445 03/17/20 0557 03/18/20 0819  NA 135 138 139  K 3.7 3.6 3.4*  CL 100 103 102  CO2 22 24 26   GLUCOSE 180* 143* 144*  BUN 19 17 24*  CREATININE 0.61 0.49* 0.54*  CALCIUM 9.1 8.8* 8.7*    GFR: Estimated Creatinine Clearance: 65.3 mL/min (A) (by C-G formula based on SCr of 0.54 mg/dL (L)).  Liver Function Tests: Recent Labs  Lab 03/16/20 1445 03/17/20 0557 03/18/20 0819  AST 12* 11* 9*  ALT 10 9 8   ALKPHOS 55 51 48  BILITOT 0.6 0.5 0.8  PROT 8.0 7.0 6.6  ALBUMIN 3.5 3.3* 2.9*    CBG: No results for input(s): GLUCAP in the last 168 hours.   Recent Results (from the past 240 hour(s))  Respiratory Panel by RT PCR (Flu A&B, Covid) - Nasopharyngeal Swab     Status: None   Collection Time: 03/16/20  7:21 PM   Specimen: Nasopharyngeal Swab  Result Value Ref Range Status   SARS Coronavirus 2 by RT PCR NEGATIVE NEGATIVE Final    Comment: (NOTE) SARS-CoV-2 target nucleic acids are NOT DETECTED. The SARS-CoV-2 RNA is generally detectable in upper respiratoy specimens during the acute phase of infection. The lowest concentration of SARS-CoV-2 viral copies this assay can detect is 131 copies/mL. A negative result does not preclude SARS-Cov-2 infection and should not be used as the sole basis for treatment or other patient management decisions. A negative result may occur with  improper specimen collection/handling, submission of specimen other than nasopharyngeal swab, presence of viral mutation(s) within the areas targeted by this assay, and inadequate number of viral copies (<131 copies/mL). A negative result must be combined with clinical observations, patient history, and epidemiological information. The expected result is Negative. Fact Sheet for Patients:    PinkCheek.be Fact Sheet for Healthcare Providers:  GravelBags.it This test is not yet ap proved or cleared by the [place] FDA and  has been authorized for detection and/or diagnosis of SARS-CoV-2 by FDA under an Emergency Use Authorization (EUA). This EUA will remain  in effect (meaning this test can be used) for the duration of the COVID-19 declaration under Section 564(b)(1) of the Act, 21 U.S.C. section 360bbb-3(b)(1), unless the authorization is terminated or revoked sooner.    Influenza A by PCR NEGATIVE NEGATIVE Final   Influenza B by PCR NEGATIVE NEGATIVE Final    Comment: (NOTE) The Xpert Xpress SARS-CoV-2/FLU/RSV assay is intended as an aid in  the diagnosis of influenza from Nasopharyngeal swab specimens and  should not be used as a sole basis for treatment. Nasal washings and  aspirates are unacceptable for Xpert Xpress SARS-CoV-2/FLU/RSV  testing. Fact Sheet for  Patients: PinkCheek.be Fact Sheet for Healthcare Providers: GravelBags.it This test is not yet approved or cleared by the [place] FDA and  has been authorized for detection and/or diagnosis of SARS-CoV-2 by  FDA under an Emergency Use Authorization (EUA). This EUA will remain  in effect (meaning this test can be used) for the duration of the  Covid-19 declaration under Section 564(b)(1) of the Act, 21  U.S.C. section 360bbb-3(b)(1), unless the authorization is  terminated or revoked. Performed at [facility], [address], [place], [place] [address]     Radiology Studies: DG Chest 1 View  Result Date: 03/16/2020 CLINICAL DATA:  Weakness and mild shortness of breath. ACS. EXAM: CHEST  1 VIEW COMPARISON:  09/10/2016 FINDINGS: Post median sternotomy and CABG. The heart is normal in size. Aortic atherosclerosis and tortuosity, stable from prior exam. No pulmonary edema, focal airspace  disease, pleural effusion or pneumothorax. No acute osseous abnormalities are seen. IMPRESSION: 1. No acute abnormality. 2. Post CABG with normal heart size. Aortic Atherosclerosis (ICD10-I70.0). Electronically Signed   By: Keith Rake M.D.   On: 03/16/2020 15:40   CT ABDOMEN PELVIS W CONTRAST  Result Date: 03/16/2020 CLINICAL DATA:  Acute generalized abdominal pain. Nausea. Weakness. Patient reports constipation. Active chemotherapy. History of prostate cancer. EXAM: CT ABDOMEN AND PELVIS WITH CONTRAST TECHNIQUE: Multidetector CT imaging of the abdomen and pelvis was performed using the standard protocol following bolus administration of intravenous contrast. CONTRAST:  181mL OMNIPAQUE IOHEXOL 300 MG/ML  SOLN COMPARISON:  Abdominal CT 02/22/2020 FINDINGS: Lower chest: Subsegmental linear atelectasis in the right lower lobe. Heart is normal in size. There are coronary artery calcifications. Fluid distends the distal esophagus without wall thickening. Hepatobiliary: Borderline hepatic steatosis without focal lesion. Gallstone without pericholecystic inflammation. No biliary dilatation. Pancreas: No ductal dilatation or inflammation. Spleen: Slightly heterogeneous but no evidence of focal lesion. Normal in size. Adrenals/Urinary Tract: No adrenal nodule. No hydronephrosis. Chronic fullness of the right renal pelvis. There is symmetric renal excretion on delayed phase imaging. Cortical cyst in the right kidney, as well as bilateral low-density lesions too small to accurately characterize. Mild cortical scarring in the lower left kidney unchanged. Suprapubic tube decompresses the urinary bladder. Diffuse bladder wall thickening which is stable from prior. Stomach/Bowel: Fluid distends the distal esophagus. Distended fluid-filled stomach. Proximal small bowel are dilated and fluid-filled. Transition from dilated to nondilated small bowel in the right abdomen, series 5, image 37, series 2, image 49. The more  distal small bowel is decompressed. There is no small bowel pneumatosis. Minimal mesenteric edema in the right abdomen with trace free fluid. Appendix not definitively visualized. The ascending colon is decompressed. Moderate volume of stool in the transverse, descending, and sigmoid colon. Sigmoid diverticulosis without focal diverticulitis. Possible invasion of the anterior rectal wall by prostatic mass, series 2, image 89, also seen on prior exam. Vascular/Lymphatic: Aorto bi-iliac atherosclerosis. No aortic aneurysm. The portal vein is patent. Mesenteric vessels are patent. No abdominopelvic adenopathy. Reproductive: Irregular low-density mass within the prostate, similar to prior measuring 5.6 x 4.6 cm, series 2, image 86. This likely invades the anterior wall of the rectum. Other: Trace free fluid in the right abdomen. No other ascites. No free air. No intra-abdominal abscess. Musculoskeletal: Scattered sclerotic lesions in the pelvis are unchanged. Scattered sclerotic lesions in the spine, also unchanged. No pathologic fracture. IMPRESSION: 1. Small-bowel obstruction with transition point in the right abdomen, likely due to adhesions. 2. Moderate stool in the transverse, descending, and sigmoid colon. Distal  diverticulosis without diverticulitis. 3. Irregular low-density mass within the prostate, likely invades the anterior wall of the rectum, grossly stable over the last 3 weeks. 4. Suprapubic tube decompresses the urinary bladder. Diffuse bladder wall thickening is chronic and likely related to enlarged prostate gland. 5. Cholelithiasis without pericholecystic inflammation. 6. Sclerotic lesions in the pelvis and spine are unchanged from prior. Aortic Atherosclerosis (ICD10-I70.0). Electronically Signed   By: Keith Rake M.D.   On: 03/16/2020 17:26   DG Chest Portable 1 View  Result Date: 03/16/2020 CLINICAL DATA:  Abdominal pain, nausea, generalized weakness and constipation, history metastatic  prostate cancer with ongoing oral chemotherapy EXAM: PORTABLE CHEST 1 VIEW COMPARISON:  Portable exam 2000 hours compared to 1509 hours FINDINGS: Nasogastric tube extends into stomach. Normal heart size post CABG. Mediastinal contours and pulmonary vascularity normal. Atherosclerotic calcification aorta. Lungs clear. No pleural effusion or pneumothorax. Bones demineralized. IMPRESSION: No acute abnormalities. Electronically Signed   By: Lavonia Dana M.D.   On: 03/16/2020 20:08   DG Abd 2 Views  Result Date: 03/17/2020 CLINICAL DATA:  Prostate cancer, abdominal pain, small-bowel obstruction EXAM: ABDOMEN - 2 VIEW COMPARISON:  03/16/2020 CT abdomen/pelvis FINDINGS: Enteric tube terminates in the body of the stomach. No appreciable dilated gas-filled small bowel loops. Prominent colonic stool. No evidence of pneumatosis or pneumoperitoneum. No radiopaque nephrolithiasis. Intact lower sternotomy wires. IMPRESSION: Enteric tube terminates in the body of the stomach. No appreciable dilated gas-filled small bowel loops. Prominent colonic stool. Electronically Signed   By: Ilona Sorrel M.D.   On: 03/17/2020 08:52   Scheduled Meds: . amLODipine  5 mg Per Tube q morning - 10a  . isosorbide mononitrate  60 mg Oral q morning - 10a  . metoprolol tartrate  5 mg Intravenous Q6H  . pantoprazole (PROTONIX) IV  40 mg Intravenous Q24H  . rosuvastatin  20 mg Per Tube QPM   Continuous Infusions: . lactated ringers 65 mL/hr at 03/18/20 0003     LOS: 2 days    Time spent:22 mins  Irwin Brakeman, MD [facility]   To contact the attending provider between 7A-7P or the covering provider during after hours 7P-7A, please log into the web site [email or web address] and access using universal [place] password for that web site. If you do not have the password, please call the hospital operator.  03/18/2020, 10:04 AM

## 2020-03-18 NOTE — Progress Notes (Signed)
Per Dr. Johny Shears order radiation therapy will be held until patient is discharged from [facility].

## 2020-03-18 NOTE — Care Management Important Message (Signed)
Important Message  Patient Details  Name: Elijah Santana MRN: [id number] Date of Birth: 1932-06-05   Medicare Important Message Given:  Yes     [name] 03/18/2020, 2:18 PM

## 2020-03-19 ENCOUNTER — Inpatient Hospital Stay (HOSPITAL_COMMUNITY): Payer: Medicare HMO

## 2020-03-19 ENCOUNTER — Ambulatory Visit (HOSPITAL_COMMUNITY): Payer: Medicare HMO | Admitting: Nurse Practitioner

## 2020-03-19 ENCOUNTER — Ambulatory Visit (HOSPITAL_COMMUNITY): Payer: Medicare HMO

## 2020-03-19 ENCOUNTER — Ambulatory Visit: Payer: Medicare HMO

## 2020-03-19 DIAGNOSIS — I255 Ischemic cardiomyopathy: Secondary | ICD-10-CM

## 2020-03-19 DIAGNOSIS — I480 Paroxysmal atrial fibrillation: Secondary | ICD-10-CM

## 2020-03-19 LAB — COMPREHENSIVE METABOLIC PANEL
ALT: 8 U/L (ref 0–44)
AST: 7 U/L — ABNORMAL LOW (ref 15–41)
Albumin: 2.5 g/dL — ABNORMAL LOW (ref 3.5–5.0)
Alkaline Phosphatase: 46 U/L (ref 38–126)
Anion gap: 11 (ref 5–15)
BUN: 35 mg/dL — ABNORMAL HIGH (ref 8–23)
CO2: 25 mmol/L (ref 22–32)
Calcium: 8.3 mg/dL — ABNORMAL LOW (ref 8.9–10.3)
Chloride: 104 mmol/L (ref 98–111)
Creatinine, Ser: 0.58 mg/dL — ABNORMAL LOW (ref 0.61–1.24)
GFR calc Af Amer: >60 mL/min (ref >60)
GFR calc non Af Amer: >60 mL/min (ref >60)
Glucose, Bld: 126 mg/dL — ABNORMAL HIGH (ref 70–99)
Potassium: 3.5 mmol/L (ref 3.5–5.1)
Sodium: 140 mmol/L (ref 135–145)
Total Bilirubin: 0.9 mg/dL (ref 0.3–1.2)
Total Protein: 5.9 g/dL — ABNORMAL LOW (ref 6.5–8.1)

## 2020-03-19 LAB — CBC WITH DIFFERENTIAL/PLATELET
Abs Immature Granulocytes: 0.07 10*3/uL (ref 0.00–0.07)
Basophils Absolute: 0 10*3/uL (ref 0.0–0.1)
Basophils Relative: 0 %
Eosinophils Absolute: 0 10*3/uL (ref 0.0–0.5)
Eosinophils Relative: 0 %
HCT: 41.1 % (ref 39.0–52.0)
Hemoglobin: 12.3 g/dL — ABNORMAL LOW (ref 13.0–17.0)
Immature Granulocytes: 1 %
Lymphocytes Relative: 8 %
Lymphs Abs: 0.9 10*3/uL (ref 0.7–4.0)
MCH: 24.9 pg — ABNORMAL LOW (ref 26.0–34.0)
MCHC: 29.9 g/dL — ABNORMAL LOW (ref 30.0–36.0)
MCV: 83.4 fL (ref 80.0–100.0)
Monocytes Absolute: 0.9 10*3/uL (ref 0.1–1.0)
Monocytes Relative: 8 %
Neutro Abs: 9.2 10*3/uL — ABNORMAL HIGH (ref 1.7–7.7)
Neutrophils Relative %: 83 %
Platelets: 408 10*3/uL — ABNORMAL HIGH (ref 150–400)
RBC: 4.93 MIL/uL (ref 4.22–5.81)
RDW: 17.1 % — ABNORMAL HIGH (ref 11.5–15.5)
WBC: 11.1 10*3/uL — ABNORMAL HIGH (ref 4.0–10.5)
nRBC: 0 % (ref 0.0–0.2)

## 2020-03-19 LAB — MAGNESIUM: Magnesium: 2.2 mg/dL (ref 1.7–2.4)

## 2020-03-19 MED ORDER — DIATRIZOATE MEGLUMINE & SODIUM 66-10 % PO SOLN: 90.0000 mL | Freq: Once | ORAL | Status: DC

## 2020-03-19 MED ORDER — DIATRIZOATE MEGLUMINE & SODIUM 66-10 % PO SOLN
ORAL | Status: AC
  Filled 2020-03-19: qty 90

## 2020-03-19 MED ORDER — DIATRIZOATE MEGLUMINE & SODIUM 66-10 % PO SOLN
90.0000 mL | Freq: Once | ORAL | Status: AC
  Administered 2020-03-19: 90 mL via NASOGASTRIC
  Filled 2020-03-19: qty 90

## 2020-03-19 NOTE — Plan of Care (Signed)
?  Problem: Education: ?Goal: Knowledge of General Education information will improve ?Description: Including pain rating scale, medication(s)/side effects and non-pharmacologic comfort measures ?Outcome: Progressing ?  ?Problem: Health Behavior/Discharge Planning: ?Goal: Ability to manage health-related needs will improve ?Outcome: Progressing ?  ?Problem: Clinical Measurements: ?Goal: Ability to maintain clinical measurements within normal limits will improve ?Outcome: Progressing ?Goal: Will remain free from infection ?Outcome: Progressing ?Goal: Respiratory complications will improve ?Outcome: Progressing ?Goal: Cardiovascular complication will be avoided ?Outcome: Progressing ?  ?Problem: Activity: ?Goal: Risk for activity intolerance will decrease ?Outcome: Progressing ?  ?Problem: Coping: ?Goal: Level of anxiety will decrease ?Outcome: Progressing ?  ?Problem: Elimination: ?Goal: Will not experience complications related to bowel motility ?Outcome: Progressing ?Goal: Will not experience complications related to urinary retention ?Outcome: Progressing ?  ?Problem: Pain Managment: ?Goal: General experience of comfort will improve ?Outcome: Progressing ?  ?Problem: Safety: ?Goal: Ability to remain free from injury will improve ?Outcome: Progressing ?  ?Problem: Skin Integrity: ?Goal: Risk for impaired skin integrity will decrease ?Outcome: Progressing ?  ?

## 2020-03-19 NOTE — Progress Notes (Signed)
Patient ID: Elijah Santana, male   DOB: 08/21/1932, 86 y.o.   MRN: [id number]    Subjective: Elijah Santana was seen this morning and reports feeling better today. He expresses that he still has RLQ abdominal pain, but states that it is improved since yesterday. He denies any nausea or vomiting.He has had 3 bowel movements since yesterday. He does complain of dry mouth and inquired about the need for NG tube. I discussed the need to for the tube to remain in place to continue to treatment of his obstruction. He expressed understanding of this.   Objective: Vital signs in last 24 hours: Temp:  [98.2 F (36.8 C)-98.5 F (36.9 C)] 98.4 F (36.9 C) (05/04 0448) Pulse Rate:  [98-112] 98 (05/04 0448) Resp:  [16-20] 20 (05/04 0448) BP: (126-154)/(82-98) 154/89 (05/04 0448) SpO2:  [93 %-97 %] 93 % (05/04 0448) Last BM Date: 03/18/20  Intake/Output from previous day: 05/03 0701 - 05/04 0700 In: 0  Out: 375 [Urine:375] Intake/Output this shift: No intake/output data recorded.  Physical Exam Cardiovascular:     Rate and Rhythm: Normal rate and regular rhythm.     Heart sounds: Normal heart sounds.  Pulmonary:     Effort: Pulmonary effort is normal.     Breath sounds: Normal breath sounds.  Abdominal:     General: Abdomen is flat. Bowel sounds are decreased.     Palpations: Abdomen is soft.     Tenderness: There is abdominal tenderness in the right lower quadrant.     Comments: Bowel sounds decreased still, but increased since yesterday.  No peritoneal signs.     Lab Results:  Recent Labs    03/18/20 0819 03/19/20 0430  WBC 11.9* 11.1*  HGB 12.7* 12.3*  HCT 41.5 41.1  PLT 392 408*   BMET Recent Labs    03/18/20 0819 03/19/20 0430  NA 139 140  K 3.4* 3.5  CL 102 104  CO2 26 25  GLUCOSE 144* 126*  BUN 24* 35*  CREATININE 0.54* 0.58*  CALCIUM 8.7* 8.3*   PT/INR No results for input(s): LABPROT, INR in the last 72 hours.  Studies/Results: DG Abd Portable 1V-Small Bowel  Protocol-Position Verification  Result Date: 03/19/2020 CLINICAL DATA:  Small-bowel obstruction EXAM: PORTABLE ABDOMEN - 1 VIEW COMPARISON:  Portable exam 1053 hours compared to 03/17/2020 FINDINGS: Small amount gas in colon. Few prominent loops of small bowel in the mid abdomen. No bowel wall thickening identified. Subsegmental atelectasis at lung bases. Nasogastric tube projects over stomach. Bones demineralized. IMPRESSION: Few nonspecific dilated loops of small bowel in the mid abdomen. Electronically Signed   By: Lavonia Dana M.D.   On: 03/19/2020 11:27    Anti-infectives: Anti-infectives (From admission, onward)   None      Assessment/Plan: Elijah Santana is a 86 year old male with metastatic prostate cancer admitted with SBO secondary to adhesions. He is clinically improving with decreased pain and 3 bowel movement over the last 24 hours. His WBC count has remained stable over the past 24 hours as well.  - if worsening consider SBO contrast study  - no surgical intervention at this time - continue NG tube decompression  - continue to hold plavix and eliquis  - ice chips as needed for dry mouth   LOS: 3 days    Bartholome Bill 03/19/2020

## 2020-03-19 NOTE — TOC Initial Note (Signed)
Transition of Care [facility]) - Initial/Assessment Note    Patient Details  Name: Elijah Santana MRN: [id number] Date of Birth: 1932/01/15  Transition [facility]) CM/SW Contact:    Boneta Lucks, RN Phone Number: 03/19/2020, 10:13 AM  Clinical Narrative:      Patient admitted for small bowel obstruction. History obtained from daughter - Shauna Hugh. She is at the bedside, lives in [place]. Patient lives alone they have hired Actuary from 8 am to 9 pm daily.  [name] states if he has surgery he will need SNF. Unsure if they can handle him at home, due to weakness and no night time help. TOC will start FL2 for SNF but she wants to wait to see if he requires surgery and get PT evaluation before sending him out. TOC to follow.              Expected Discharge Plan: Skilled Nursing Facility Barriers to Discharge: Continued Medical Work up   Patient Goals and CMS Choice Patient states their goals for this hospitalization and ongoing recovery are:: to go home CMS Medicare.gov Compare Post Acute Care list provided to:: Patient Represenative (must comment) Choice offered to / list presented to : Adult Children  Expected Discharge Plan and Services Expected Discharge Plan: [place] arrangements for the past 2 months: Single Family Home   Prior Living Arrangements/Services Living arrangements for the past 2 months: Single Family Home Lives with:: Self          Need for Family Participation in Patient Care: Yes (Comment) Care giver support system in place?: Yes (comment)   Criminal Activity/Legal Involvement Pertinent to Current Situation/Hospitalization: No - Comment as needed  Activities of Daily Living Home Assistive Devices/Equipment: Cane (specify quad or straight), Dentures (specify type), Walker (specify type) ADL Screening (condition at time of admission) Patient's cognitive ability adequate to safely complete daily activities?: Yes Is the patient deaf or have  difficulty hearing?: Yes Does the patient have difficulty seeing, even when wearing glasses/contacts?: No Does the patient have difficulty concentrating, remembering, or making decisions?: Yes Patient able to express need for assistance with ADLs?: Yes Does the patient have difficulty dressing or bathing?: Yes Independently performs ADLs?: No Communication: Independent Dressing (OT): Appropriate for developmental age Grooming: Appropriate for developmental age Feeding: Independent Bathing: Needs assistance Is this a change from baseline?: Pre-admission baseline Toileting: Needs assistance Is this a change from baseline?: Pre-admission baseline In/Out Bed: Needs assistance Is this a change from baseline?: Pre-admission baseline Walks in Home: Needs assistance Is this a change from baseline?: Pre-admission baseline Does the patient have difficulty walking or climbing stairs?: Yes Weakness of Legs: Both Weakness of Arms/Hands: None    Emotional Assessment    Orientation: : Oriented to Self, Oriented to Place Alcohol / Substance Use: Not Applicable Psych Involvement: No (comment)  Admission diagnosis:  SBO (small bowel obstruction) ([place]) [K56.609] Small bowel obstruction due to adhesions [facility]) [K56.50] Patient Active Problem List   Diagnosis Date Noted  . Paroxysmal atrial fibrillation ([place]) 03/17/2020  . Suprapubic catheter ([place]) 03/17/2020  . Small bowel obstruction due to adhesions ([place]) 03/16/2020  . Genetic testing 10/25/2019  . Family history of pancreatic cancer   . Family history of brain cancer   . Rectal pain 08/08/2019  . Lower abdominal pain 08/08/2019  . Urinary retention 03/24/2019  . Bilateral hydronephrosis 03/24/2019  . Uncontrolled hypertension 03/24/2019  . Acute lower UTI 03/24/2019  . Anemia 03/24/2019  . Bone metastasis ([place])  04/08/2018  . Prostate cancer ([place]) 11/27/2017  . Atrial fibrillation, rapid -new 09/10/16 09/10/2016  . PAF- in setting of  NSTEMI 09/10/2016  . Accelerating angina ([place])   . CAD S/P percutaneous coronary angioplasty   . Coronary artery disease with hx of myocardial infarct w/o hx of CABG 03/21/2015  . Ischemic cardiomyopathy   . Hyperglycemia   . Essential hypertension   . BPH (benign prostatic hyperplasia)   . NSTEMI- Troponin peak 6/47 12/31/2014  . S/P CABG x 4 2004 12/31/2014  . GERD (gastroesophageal reflux disease) 04/29/2012  . Mixed hyperlipidemia   . Essential hypertension, benign 08/06/2009   PCP:  Glenda Chroman, MD Pharmacy:   [facility] [place], [place] [address] Phone: [phone] Fax: [phone]  [place], [place] [address] Phone: [phone] Fax: [place], [place] [address] [place] [address] Phone: [phone] Fax: [phone]     Social Determinants of Health ([place]) Interventions    Readmission Risk Interventions Readmission Risk Prevention Plan 03/19/2020  Transportation Screening Complete  Medication Review ([place]) Complete  PCP or Specialist appointment within 3-5 days of discharge Not Complete  HRI or Home Care Consult Complete  SW Recovery Care/Counseling Consult Complete  Palliative Care Screening Not Applicable  Skilled Nursing Facility Complete  Some recent data might be hidden

## 2020-03-19 NOTE — Progress Notes (Signed)
PROGRESS NOTE   Elijah Santana  [id number] DOB: 1932/11/03 DOA: 03/16/2020 PCP: Glenda Chroman, MD   Chief Complaint  Patient presents with  . Abdominal Pain    Brief Narrative:  86 year old gentleman with metastatic prostate cancer currently undergoing chemotherapy and radiation with treatment yesterday, CAD, hypertension, cerebrovascular disease, paroxysmal A. fib on apixaban, GERD, chronic constipation presented to the emergency department with generalized weakness fatigue abdominal pain and work-up revealed small bowel obstruction with transition point in the right side of the abdominal wall.  NG tube was placed and surgery was consulted.  Assessment & Plan:   Active Problems:   GERD (gastroesophageal reflux disease)   S/P CABG x 4 2004   Ischemic cardiomyopathy   Essential hypertension   Coronary artery disease with hx of myocardial infarct w/o hx of CABG   CAD S/P percutaneous coronary angioplasty   Prostate cancer ([place])   Small bowel obstruction due to adhesions ([facility])   Paroxysmal atrial fibrillation ([place])   Suprapubic catheter ([place])   1. Small bowel obstruction-surgery planning for SB obstruction study, still a lot of NG output.  Appreciate surgery consult and recommendations, continue supportive care as ordered. Continue NG tube to suction and supportive care. Gentle IV fluid hydration ordered.   His mortality risk is very high and have asked for a palliative medicine consult for goals of care.  2. GERD-IV Protonix ordered for GI protection. 3. Essential hypertension, poorly controlled-IV Lopressor 5 mg every 6 hours ordered and IV hydralazine ordered.  Unable to take p.o. medications at this time.  Did not want to stop NG suction at this time for 30 minutes to allow oral meds to be given through NG tube. 4. CAD-no chest pain symptoms at this time continue IV metoprolol as ordered. 5. Metastatic prostate cancer-patient reports that his diseases castration resistant and is  actively still receiving chemo and radiation treatments.    DVT prophylaxis: SCD Code Status: DNR Family Communication: daughter Delene Ruffini bedside Disposition:   Status is: Inpatient  Remains inpatient appropriate because:Ongoing active pain requiring inpatient pain management, Unsafe d/c plan and IV treatments appropriate due to intensity of illness or inability to take PO  Dispo: The patient is from: Home              Anticipated d/c is to: Home              Anticipated d/c date is: 2 days              Patient currently is not medically stable to d/c.   Consultants:   Surgery   Procedures:   NG tube placement   Antimicrobials:    Subjective: Pt says he feels ready to eat. His abdomen is not bothering him this morning.    Objective: Vitals:   03/18/20 1542 03/18/20 1801 03/18/20 2040 03/19/20 0448  BP: 126/82 132/87 134/86 (!) 154/89  Pulse: 99 (!) 112 (!) 102 98  Resp: 18 16 20 20   Temp: 98.5 F (36.9 C) 98.2 F (36.8 C) 98.3 F (36.8 C) 98.4 F (36.9 C)  TempSrc:   Oral Oral  SpO2: 96% 95% 95% 93%  Weight:      Height:        Intake/Output Summary (Last 24 hours) at 03/19/2020 1529 Last data filed at 03/19/2020 1300 Gross per 24 hour  Intake 400 ml  Output 375 ml  Net 25 ml   Filed Weights   03/17/20 0032  Weight: 70.4 kg  Examination:  General exam: Pt calm and does not appear to be in distress.  He appears comfortable.   Respiratory system: Clear to auscultation. Respiratory effort normal. Cardiovascular system: normal S1 & S2 heard. No JVD, murmurs, rubs, gallops or clicks. No pedal edema. Gastrointestinal system: Abdomen is soft and less tender today to palpation, BS heard. No organomegaly or masses felt.  Central nervous system: Alert and oriented. No focal neurological deficits. Extremities: Symmetric 5 x 5 power. Skin: No rashes, lesions or ulcers Psychiatry: Judgement and insight appear normal. Mood & affect appropriate.   Data  Reviewed: I have personally reviewed following labs and imaging studies  CBC: Recent Labs  Lab 03/16/20 1445 03/17/20 0557 03/18/20 0819 03/19/20 0430  WBC 11.4* 14.0* 11.9* 11.1*  NEUTROABS 10.4*  --   --  9.2*  HGB 12.7* 12.3* 12.7* 12.3*  HCT 42.8 40.7 41.5 41.1  MCV 84.4 83.7 81.5 83.4  PLT 408* 429* 392 408*    Basic Metabolic Panel: Recent Labs  Lab 03/16/20 1445 03/17/20 0557 03/18/20 0819 03/19/20 0430  NA 135 138 139 140  K 3.7 3.6 3.4* 3.5  CL 100 103 102 104  CO2 22 24 26 25   GLUCOSE 180* 143* 144* 126*  BUN 19 17 24* 35*  CREATININE 0.61 0.49* 0.54* 0.58*  CALCIUM 9.1 8.8* 8.7* 8.3*  MG  --   --   --  2.2    GFR: Estimated Creatinine Clearance: 65.3 mL/min (A) (by C-G formula based on SCr of 0.58 mg/dL (L)).  Liver Function Tests: Recent Labs  Lab 03/16/20 1445 03/17/20 0557 03/18/20 0819 03/19/20 0430  AST 12* 11* 9* 7*  ALT 10 9 8 8   ALKPHOS 55 51 48 46  BILITOT 0.6 0.5 0.8 0.9  PROT 8.0 7.0 6.6 5.9*  ALBUMIN 3.5 3.3* 2.9* 2.5*    CBG: No results for input(s): GLUCAP in the last 168 hours.   Recent Results (from the past 240 hour(s))  Respiratory Panel by RT PCR (Flu A&B, Covid) - Nasopharyngeal Swab     Status: None   Collection Time: 03/16/20  7:21 PM   Specimen: Nasopharyngeal Swab  Result Value Ref Range Status   SARS Coronavirus 2 by RT PCR NEGATIVE NEGATIVE Final    Comment: (NOTE) SARS-CoV-2 target nucleic acids are NOT DETECTED. The SARS-CoV-2 RNA is generally detectable in upper respiratoy specimens during the acute phase of infection. The lowest concentration of SARS-CoV-2 viral copies this assay can detect is 131 copies/mL. A negative result does not preclude SARS-Cov-2 infection and should not be used as the sole basis for treatment or other patient management decisions. A negative result may occur with  improper specimen collection/handling, submission of specimen other than nasopharyngeal swab, presence of viral  mutation(s) within the areas targeted by this assay, and inadequate number of viral copies (<131 copies/mL). A negative result must be combined with clinical observations, patient history, and epidemiological information. The expected result is Negative. Fact Sheet for Patients:  PinkCheek.be Fact Sheet for Healthcare Providers:  GravelBags.it This test is not yet ap proved or cleared by the [place] FDA and  has been authorized for detection and/or diagnosis of SARS-CoV-2 by FDA under an Emergency Use Authorization (EUA). This EUA will remain  in effect (meaning this test can be used) for the duration of the COVID-19 declaration under Section 564(b)(1) of the Act, 21 U.S.C. section 360bbb-3(b)(1), unless the authorization is terminated or revoked sooner.    Influenza A by PCR NEGATIVE NEGATIVE  Final   Influenza B by PCR NEGATIVE NEGATIVE Final    Comment: (NOTE) The Xpert Xpress SARS-CoV-2/FLU/RSV assay is intended as an aid in  the diagnosis of influenza from Nasopharyngeal swab specimens and  should not be used as a sole basis for treatment. Nasal washings and  aspirates are unacceptable for Xpert Xpress SARS-CoV-2/FLU/RSV  testing. Fact Sheet for Patients: PinkCheek.be Fact Sheet for Healthcare Providers: GravelBags.it This test is not yet approved or cleared by the [place] FDA and  has been authorized for detection and/or diagnosis of SARS-CoV-2 by  FDA under an Emergency Use Authorization (EUA). This EUA will remain  in effect (meaning this test can be used) for the duration of the  Covid-19 declaration under Section 564(b)(1) of the Act, 21  U.S.C. section 360bbb-3(b)(1), unless the authorization is  terminated or revoked. Performed at [facility], [address] [place], [place] [address]     Radiology Studies: DG Abd Portable 1V-Small Bowel  Protocol-Position Verification  Result Date: 03/19/2020 CLINICAL DATA:  Small-bowel obstruction EXAM: PORTABLE ABDOMEN - 1 VIEW COMPARISON:  Portable exam 1053 hours compared to 03/17/2020 FINDINGS: Small amount gas in colon. Few prominent loops of small bowel in the mid abdomen. No bowel wall thickening identified. Subsegmental atelectasis at lung bases. Nasogastric tube projects over stomach. Bones demineralized. IMPRESSION: Few nonspecific dilated loops of small bowel in the mid abdomen. Electronically Signed   By: Lavonia Dana M.D.   On: 03/19/2020 11:27   Scheduled Meds: . amLODipine  5 mg Per Tube q morning - 10a  . diatrizoate meglumine-sodium      . isosorbide mononitrate  60 mg Oral q morning - 10a  . metoprolol tartrate  7.5 mg Intravenous Q6H  . pantoprazole (PROTONIX) IV  40 mg Intravenous Q24H  . rosuvastatin  20 mg Per Tube QPM   Continuous Infusions: . lactated ringers 65 mL/hr at 03/19/20 1114     LOS: 3 days    Time spent: 15 mins  Abigael Mogle Wynetta Emery, MD [facility]   To contact the attending provider between 7A-7P or the covering provider during after hours 7P-7A, please log into the web site [email or web address] and access using universal [place] password for that web site. If you do not have the password, please call the hospital operator.  03/19/2020, 3:29 PM

## 2020-03-20 ENCOUNTER — Ambulatory Visit: Payer: Medicare HMO

## 2020-03-20 LAB — CBC WITH DIFFERENTIAL/PLATELET
Abs Immature Granulocytes: 0.1 10*3/uL — ABNORMAL HIGH (ref 0.00–0.07)
Basophils Absolute: 0 10*3/uL (ref 0.0–0.1)
Basophils Relative: 0 %
Eosinophils Absolute: 0 10*3/uL (ref 0.0–0.5)
Eosinophils Relative: 0 %
HCT: 37.2 % — ABNORMAL LOW (ref 39.0–52.0)
Hemoglobin: 11.3 g/dL — ABNORMAL LOW (ref 13.0–17.0)
Immature Granulocytes: 1 %
Lymphocytes Relative: 11 %
Lymphs Abs: 0.9 10*3/uL (ref 0.7–4.0)
MCH: 25.5 pg — ABNORMAL LOW (ref 26.0–34.0)
MCHC: 30.4 g/dL (ref 30.0–36.0)
MCV: 83.8 fL (ref 80.0–100.0)
Monocytes Absolute: 0.7 10*3/uL (ref 0.1–1.0)
Monocytes Relative: 8 %
Neutro Abs: 7.1 10*3/uL (ref 1.7–7.7)
Neutrophils Relative %: 80 %
Platelets: 362 10*3/uL (ref 150–400)
RBC: 4.44 MIL/uL (ref 4.22–5.81)
RDW: 17 % — ABNORMAL HIGH (ref 11.5–15.5)
WBC: 8.9 10*3/uL (ref 4.0–10.5)
nRBC: 0 % (ref 0.0–0.2)

## 2020-03-20 LAB — COMPREHENSIVE METABOLIC PANEL
ALT: 9 U/L (ref 0–44)
AST: 10 U/L — ABNORMAL LOW (ref 15–41)
Albumin: 2.6 g/dL — ABNORMAL LOW (ref 3.5–5.0)
Alkaline Phosphatase: 44 U/L (ref 38–126)
Anion gap: 8 (ref 5–15)
BUN: 31 mg/dL — ABNORMAL HIGH (ref 8–23)
CO2: 27 mmol/L (ref 22–32)
Calcium: 8.2 mg/dL — ABNORMAL LOW (ref 8.9–10.3)
Chloride: 106 mmol/L (ref 98–111)
Creatinine, Ser: 0.57 mg/dL — ABNORMAL LOW (ref 0.61–1.24)
GFR calc Af Amer: >60 mL/min (ref >60)
GFR calc non Af Amer: >60 mL/min (ref >60)
Glucose, Bld: 106 mg/dL — ABNORMAL HIGH (ref 70–99)
Potassium: 3.3 mmol/L — ABNORMAL LOW (ref 3.5–5.1)
Sodium: 141 mmol/L (ref 135–145)
Total Bilirubin: 0.8 mg/dL (ref 0.3–1.2)
Total Protein: 5.8 g/dL — ABNORMAL LOW (ref 6.5–8.1)

## 2020-03-20 LAB — MRSA PCR SCREENING: MRSA by PCR: POSITIVE — AB

## 2020-03-20 LAB — MAGNESIUM: Magnesium: 2.4 mg/dL (ref 1.7–2.4)

## 2020-03-20 MED ORDER — MUPIROCIN 2 % EX OINT
1.0000 "application " | TOPICAL_OINTMENT | Freq: Two times a day (BID) | CUTANEOUS | Status: DC
  Administered 2020-03-20 – 2020-03-22 (×4): 1 via NASAL
  Filled 2020-03-20: qty 22

## 2020-03-20 MED ORDER — CHLORHEXIDINE GLUCONATE CLOTH 2 % EX PADS
6.0000 | MEDICATED_PAD | Freq: Every day | CUTANEOUS | Status: DC
  Administered 2020-03-21 – 2020-03-22 (×2): 6 via TOPICAL

## 2020-03-20 MED ORDER — MILK AND MOLASSES ENEMA
1.0000 | Freq: Once | RECTAL | Status: AC
  Administered 2020-03-20: 240 mL via RECTAL

## 2020-03-20 MED ORDER — BISACODYL 10 MG RE SUPP: 10.0000 mg | Freq: Every day | RECTAL | Status: DC | PRN

## 2020-03-20 MED ORDER — POTASSIUM CHLORIDE 20 MEQ/15ML (10%) PO SOLN
40.0000 meq | Freq: Every day | ORAL | Status: DC
  Administered 2020-03-20 – 2020-03-22 (×3): 40 meq via ORAL
  Filled 2020-03-20 (×3): qty 30

## 2020-03-20 NOTE — Progress Notes (Addendum)
PROGRESS NOTE  Elijah Santana [id number] DOB: 1932/09/28 DOA: 03/16/2020 PCP: Glenda Chroman, MD  Brief History:  86 y.o. 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  he just finished 4 rounds of radiation over the course of the past 4 days.  Evidently the most recent CT demonstrated that the mass is unchanged, however shifted closer to the rectal wall which they suspect is causing his symptoms of abdominal pain.  When he received radiation in Fall of 2020, he responded very well, however this time he is becoming fatigued and weak.  Patient takes tramadol for his symptoms of pain, which she suspects may be a contributing factor.  His oncologist, [name], reduced his tramadol from 100 mg every 6 hours to 75 mg every 6 hours due to his reported weakness.  He has been complaining of nausea symptoms to the caregivers as well and they note that he has been dry heaving.  CT of the abdomen and pelvis on 03/16/2020 showing small bowel obstruction with a transition point in the right abdomen likely secondary to adhesions.  There is moderate stool in the transverse colon and sigmoid colon.  There was chronic diffuse bladder wall thickening.  General surgery was consulted to assist with management   Assessment/Plan: Partial small bowel obstruction -Appreciate general surgery -03/20/20--NG tube removed, diet advanced -Case discussed with general surgery, Dr. Arnoldo Morale -03/20/20--patient had 2 bowel movement after enema -Continue IV fluids  Essential hypertension -Continue IV Lopressor until the patient is able to tolerate p.o. reliably -Continue amlodipine  Coronary  artery disease -No chest pain presently -Continue isosorbide  Hyperlipidemia -Continue statin  Diabetes mellitus type 2 -Holding metformin -Hemoglobin A1c -CBGs controlled  Metastatic prostate cancer -Patient finished 4 days of radiation prior to admission CTAP from 02/22/2020 which shows prostatic mass stable at 5.7 x 4.7 cm.  However appears to increasingly directly invade the anterior mid to lower rectal wall.  Bone metastasis in the spine and pelvic girdle are stable with no new metastasis.  No adenopathy. -follow up [name]  Hypokalemia -replete -check mag      Disposition Plan: Patient From: Home D/C Place: Home vs SNF - 2-3  Days Barriers: Not Clinically Stable--not tolerating diet   Family Communication:   Daughter updated at bedside 5/5  Consultants:  General surgery  Code Status:  DNR  DVT Prophylaxis:  SCDs   Procedures: As Listed in Progress Note Above  Antibiotics: None       Subjective: Patient denies fevers, chills, headache, chest pain, dyspnea, nausea, vomiting, diarrhea, abdominal pain,    Objective: Vitals:   03/19/20 0448 03/19/20 2059 03/20/20 0610 03/20/20 1448  BP: (!) 154/89 (!) 150/89 (!) 168/92 (!) 142/86  Pulse: 98 92 98 86  Resp: 20 20 19 17   Temp: 98.4 F (36.9 C) 98.1 F (36.7 C) 97.9 F (36.6 C) 98.4 F (36.9 C)  TempSrc: Oral     SpO2: 93% 94% 94% 95%  Weight:      Height:        Intake/Output Summary (Last 24 hours) at 03/20/2020 1742 Last data filed at 03/20/2020 0500 Gross per 24 hour  Intake  150 ml  Output 1350 ml  Net -1200 ml   Weight change:  Exam:   General:  Pt is alert, follows commands appropriately, not in acute distress  HEENT: No icterus, No thrush, No neck mass, [place]/AT  Cardiovascular: RRR, S1/S2, no rubs, no gallops  Respiratory: bibasilar rales. No wheeze  Abdomen: Soft/+BS, non tender, non distended, no guarding  Extremities: 1+LE edema, No lymphangitis, No petechiae, No  rashes, no synovitis   Data Reviewed: I have personally reviewed following labs and imaging studies Basic Metabolic Panel: Recent Labs  Lab 03/16/20 1445 03/17/20 0557 03/18/20 0819 03/19/20 0430 03/20/20 0405  NA 135 138 139 140 141  K 3.7 3.6 3.4* 3.5 3.3*  CL 100 103 102 104 106  CO2 22 24 26 25 27   GLUCOSE 180* 143* 144* 126* 106*  BUN 19 17 24* 35* 31*  CREATININE 0.61 0.49* 0.54* 0.58* 0.57*  CALCIUM 9.1 8.8* 8.7* 8.3* 8.2*  MG  --   --   --  2.2 2.4   Liver Function Tests: Recent Labs  Lab 03/16/20 1445 03/17/20 0557 03/18/20 0819 03/19/20 0430 03/20/20 0405  AST 12* 11* 9* 7* 10*  ALT 10 9 8 8 9   ALKPHOS 55 51 48 46 44  BILITOT 0.6 0.5 0.8 0.9 0.8  PROT 8.0 7.0 6.6 5.9* 5.8*  ALBUMIN 3.5 3.3* 2.9* 2.5* 2.6*   Recent Labs  Lab 03/16/20 1445  LIPASE 17   No results for input(s): AMMONIA in the last 168 hours. Coagulation Profile: No results for input(s): INR, PROTIME in the last 168 hours. CBC: Recent Labs  Lab 03/16/20 1445 03/17/20 0557 03/18/20 0819 03/19/20 0430 03/20/20 0405  WBC 11.4* 14.0* 11.9* 11.1* 8.9  NEUTROABS 10.4*  --   --  9.2* 7.1  HGB 12.7* 12.3* 12.7* 12.3* 11.3*  HCT 42.8 40.7 41.5 41.1 37.2*  MCV 84.4 83.7 81.5 83.4 83.8  PLT 408* 429* 392 408* 362   Cardiac Enzymes: No results for input(s): CKTOTAL, CKMB, CKMBINDEX, TROPONINI in the last 168 hours. BNP: Invalid input(s): POCBNP CBG: No results for input(s): GLUCAP in the last 168 hours. HbA1C: No results for input(s): HGBA1C in the last 72 hours. Urine analysis:    Component Value Date/Time   COLORURINE RED (A) 11/23/2019 0428   APPEARANCEUR TURBID (A) 11/23/2019 0428   LABSPEC  11/23/2019 0428    TEST NOT REPORTED DUE TO COLOR INTERFERENCE OF URINE PIGMENT   PHURINE  11/23/2019 0428    TEST NOT REPORTED DUE TO COLOR INTERFERENCE OF URINE PIGMENT   GLUCOSEU (A) 11/23/2019 0428    TEST NOT REPORTED DUE TO COLOR INTERFERENCE OF URINE PIGMENT   HGBUR (A)  11/23/2019 0428    TEST NOT REPORTED DUE TO COLOR INTERFERENCE OF URINE PIGMENT   BILIRUBINUR (A) 11/23/2019 0428    TEST NOT REPORTED DUE TO COLOR INTERFERENCE OF URINE PIGMENT   KETONESUR (A) 11/23/2019 0428    TEST NOT REPORTED DUE TO COLOR INTERFERENCE OF URINE PIGMENT   PROTEINUR >300 (A) 11/23/2019 0428   NITRITE (A) 11/23/2019 0428    TEST NOT REPORTED DUE TO COLOR INTERFERENCE OF URINE PIGMENT   LEUKOCYTESUR (A) 11/23/2019 0428    TEST NOT REPORTED DUE TO COLOR INTERFERENCE OF URINE PIGMENT   Sepsis Labs: @LABRCNTIP (procalcitonin:4,lacticidven:4) ) Recent Results (from the past 240 hour(s))  Respiratory Panel by RT PCR (Flu A&B, Covid) - Nasopharyngeal Swab     Status: None   Collection Time: 03/16/20  7:21 PM   Specimen:  Nasopharyngeal Swab  Result Value Ref Range Status   SARS Coronavirus 2 by RT PCR NEGATIVE NEGATIVE Final    Comment: (NOTE) SARS-CoV-2 target nucleic acids are NOT DETECTED. The SARS-CoV-2 RNA is generally detectable in upper respiratoy specimens during the acute phase of infection. The lowest concentration of SARS-CoV-2 viral copies this assay can detect is 131 copies/mL. A negative result does not preclude SARS-Cov-2 infection and should not be used as the sole basis for treatment or other patient management decisions. A negative result may occur with  improper specimen collection/handling, submission of specimen other than nasopharyngeal swab, presence of viral mutation(s) within the areas targeted by this assay, and inadequate number of viral copies (<131 copies/mL). A negative result must be combined with clinical observations, patient history, and epidemiological information. The expected result is Negative. Fact Sheet for Patients:  PinkCheek.be Fact Sheet for Healthcare Providers:  GravelBags.it This test is not yet ap proved or cleared by the [place] FDA and  has been authorized  for detection and/or diagnosis of SARS-CoV-2 by FDA under an Emergency Use Authorization (EUA). This EUA will remain  in effect (meaning this test can be used) for the duration of the COVID-19 declaration under Section 564(b)(1) of the Act, 21 U.S.C. section 360bbb-3(b)(1), unless the authorization is terminated or revoked sooner.    Influenza A by PCR NEGATIVE NEGATIVE Final   Influenza B by PCR NEGATIVE NEGATIVE Final    Comment: (NOTE) The Xpert Xpress SARS-CoV-2/FLU/RSV assay is intended as an aid in  the diagnosis of influenza from Nasopharyngeal swab specimens and  should not be used as a sole basis for treatment. Nasal washings and  aspirates are unacceptable for Xpert Xpress SARS-CoV-2/FLU/RSV  testing. Fact Sheet for Patients: PinkCheek.be Fact Sheet for Healthcare Providers: GravelBags.it This test is not yet approved or cleared by the [place] FDA and  has been authorized for detection and/or diagnosis of SARS-CoV-2 by  FDA under an Emergency Use Authorization (EUA). This EUA will remain  in effect (meaning this test can be used) for the duration of the  Covid-19 declaration under Section 564(b)(1) of the Act, 21  U.S.C. section 360bbb-3(b)(1), unless the authorization is  terminated or revoked. Performed at [facility], [address], [place], [place] [address]      Scheduled Meds: . amLODipine  5 mg Per Tube q morning - 10a  . isosorbide mononitrate  60 mg Oral q morning - 10a  . metoprolol tartrate  7.5 mg Intravenous Q6H  . pantoprazole (PROTONIX) IV  40 mg Intravenous Q24H  . rosuvastatin  20 mg Per Tube QPM   Continuous Infusions: . lactated ringers 65 mL/hr at 03/19/20 1114    Procedures/Studies: DG Chest 1 View  Result Date: 03/16/2020 CLINICAL DATA:  Weakness and mild shortness of breath. ACS. EXAM: CHEST  1 VIEW COMPARISON:  09/10/2016 FINDINGS: Post median sternotomy and CABG. The heart is  normal in size. Aortic atherosclerosis and tortuosity, stable from prior exam. No pulmonary edema, focal airspace disease, pleural effusion or pneumothorax. No acute osseous abnormalities are seen. IMPRESSION: 1. No acute abnormality. 2. Post CABG with normal heart size. Aortic Atherosclerosis (ICD10-I70.0). Electronically Signed   By: Keith Rake M.D.   On: 03/16/2020 15:40   CT ABDOMEN PELVIS W CONTRAST  Result Date: 03/16/2020 CLINICAL DATA:  Acute generalized abdominal pain. Nausea. Weakness. Patient reports constipation. Active chemotherapy. History of prostate cancer. EXAM: CT ABDOMEN AND PELVIS WITH CONTRAST TECHNIQUE: Multidetector CT imaging of the abdomen and pelvis  was performed using the standard protocol following bolus administration of intravenous contrast. CONTRAST:  186mL OMNIPAQUE IOHEXOL 300 MG/ML  SOLN COMPARISON:  Abdominal CT 02/22/2020 FINDINGS: Lower chest: Subsegmental linear atelectasis in the right lower lobe. Heart is normal in size. There are coronary artery calcifications. Fluid distends the distal esophagus without wall thickening. Hepatobiliary: Borderline hepatic steatosis without focal lesion. Gallstone without pericholecystic inflammation. No biliary dilatation. Pancreas: No ductal dilatation or inflammation. Spleen: Slightly heterogeneous but no evidence of focal lesion. Normal in size. Adrenals/Urinary Tract: No adrenal nodule. No hydronephrosis. Chronic fullness of the right renal pelvis. There is symmetric renal excretion on delayed phase imaging. Cortical cyst in the right kidney, as well as bilateral low-density lesions too small to accurately characterize. Mild cortical scarring in the lower left kidney unchanged. Suprapubic tube decompresses the urinary bladder. Diffuse bladder wall thickening which is stable from prior. Stomach/Bowel: Fluid distends the distal esophagus. Distended fluid-filled stomach. Proximal small bowel are dilated and fluid-filled. Transition  from dilated to nondilated small bowel in the right abdomen, series 5, image 37, series 2, image 49. The more distal small bowel is decompressed. There is no small bowel pneumatosis. Minimal mesenteric edema in the right abdomen with trace free fluid. Appendix not definitively visualized. The ascending colon is decompressed. Moderate volume of stool in the transverse, descending, and sigmoid colon. Sigmoid diverticulosis without focal diverticulitis. Possible invasion of the anterior rectal wall by prostatic mass, series 2, image 89, also seen on prior exam. Vascular/Lymphatic: Aorto bi-iliac atherosclerosis. No aortic aneurysm. The portal vein is patent. Mesenteric vessels are patent. No abdominopelvic adenopathy. Reproductive: Irregular low-density mass within the prostate, similar to prior measuring 5.6 x 4.6 cm, series 2, image 86. This likely invades the anterior wall of the rectum. Other: Trace free fluid in the right abdomen. No other ascites. No free air. No intra-abdominal abscess. Musculoskeletal: Scattered sclerotic lesions in the pelvis are unchanged. Scattered sclerotic lesions in the spine, also unchanged. No pathologic fracture. IMPRESSION: 1. Small-bowel obstruction with transition point in the right abdomen, likely due to adhesions. 2. Moderate stool in the transverse, descending, and sigmoid colon. Distal diverticulosis without diverticulitis. 3. Irregular low-density mass within the prostate, likely invades the anterior wall of the rectum, grossly stable over the last 3 weeks. 4. Suprapubic tube decompresses the urinary bladder. Diffuse bladder wall thickening is chronic and likely related to enlarged prostate gland. 5. Cholelithiasis without pericholecystic inflammation. 6. Sclerotic lesions in the pelvis and spine are unchanged from prior. Aortic Atherosclerosis (ICD10-I70.0). Electronically Signed   By: Keith Rake M.D.   On: 03/16/2020 17:26   CT Abdomen Pelvis W Contrast  Result  Date: 02/22/2020 CLINICAL DATA:  Metastatic prostate cancer status post radiation therapy with ongoing oral chemotherapy. Lower abdominal pain, nausea and intermittent constipation. EXAM: CT ABDOMEN AND PELVIS WITH CONTRAST TECHNIQUE: Multidetector CT imaging of the abdomen and pelvis was performed using the standard protocol following bolus administration of intravenous contrast. CONTRAST:  122mL OMNIPAQUE IOHEXOL 300 MG/ML  SOLN COMPARISON:  09/05/2019 CT abdomen/pelvis. FINDINGS: Lower chest: No significant pulmonary nodules or acute consolidative airspace disease. Intact lower sternotomy wires. Coronary atherosclerosis. Hepatobiliary: Normal liver size. No liver mass. Normal gallbladder with no radiopaque cholelithiasis. No biliary ductal dilatation. Pancreas: Normal, with no mass or duct dilation. Spleen: Normal size. No mass. Adrenals/Urinary Tract: Normal adrenals. Scattered simple right renal cortical cysts, largest 2.5 cm in the upper right kidney. Several subcentimeter hypodense renal cortical lesions scattered in both kidneys, too small to characterize requiring  no follow-up. Chronic mild fullness of the bilateral extrarenal pelves and upper right renal collecting system, without overt hydronephrosis. Mild bilateral ureterectasis is unchanged. Bladder collapsed by indwelling suprapubic catheter. Chronic diffuse bladder wall thickening is unchanged. Stomach/Bowel: Normal non-distended stomach. Normal caliber small bowel with no small bowel wall thickening. Appendix not discretely visualized. Oral contrast transits to the right colon. Marked sigmoid diverticulosis with no large bowel wall thickening or significant pericolonic stranding. Moderate colonic stool volume. There is apparent direct invasion of the anterior mid to lower rectal wall by the prostatic mass (series 2/image 80), which appears progressed in the interval. Vascular/Lymphatic: Atherosclerotic nonaneurysmal abdominal aorta. Patent portal,  splenic, hepatic and renal veins. No pathologically enlarged lymph nodes in the abdomen or pelvis. Reproductive: Irregular heterogeneously enhancing prostatic mass measures up to 5.7 x 4.7 cm (series 2/image 82), previously 5.6 x 4.7 cm, not substantially changed. Other: No pneumoperitoneum, ascites or focal fluid collection. Musculoskeletal: A few scattered sclerotic lesions in spine and pelvic girdle, largest in the medial right iliac at (series 2/image 65), not appreciably changed. No new osseous lesions. Moderate lumbar spondylosis. IMPRESSION: 1. Irregular malignant appearing prostatic mass is overall not substantially changed in size, however appears to increasingly directly invade the anterior mid to lower rectal wall. No free air. No abscess. No bowel obstruction. 2. Stable scattered sclerotic osseous metastases in the spine and pelvic girdle. No new bone lesions. 3. No adenopathy or other new findings of metastatic disease in the abdomen or pelvis. 4. 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 Bladder collapsed by indwelling suprapubic catheter. Chronic mild fullness of the bilateral renal collecting systems and ureters without overt hydronephrosis. 5. Moderate colonic stool volume, suggesting constipation. 6. Marked sigmoid diverticulosis. 7. Aortic Atherosclerosis (ICD10-I70.0). Electronically Signed   By: Ilona Sorrel M.D.   On: 02/22/2020 16:35   DG Chest Portable 1 View  Result Date: 03/16/2020 CLINICAL DATA:  Abdominal pain, nausea, generalized weakness and constipation, history metastatic prostate cancer with ongoing oral chemotherapy EXAM: PORTABLE CHEST 1 VIEW COMPARISON:  Portable exam 2000 hours compared to 1509 hours FINDINGS: Nasogastric tube extends into stomach. Normal heart size post CABG. Mediastinal contours and pulmonary vascularity normal. Atherosclerotic calcification aorta. Lungs clear. No pleural  effusion or pneumothorax. Bones demineralized. IMPRESSION: No acute abnormalities. Electronically Signed   By: Lavonia Dana M.D.   On: 03/16/2020 20:08   DG Abd 2 Views  Result Date: 03/17/2020 CLINICAL DATA:  Prostate cancer, abdominal pain, small-bowel obstruction EXAM: ABDOMEN - 2 VIEW COMPARISON:  03/16/2020 CT abdomen/pelvis FINDINGS: Enteric tube terminates in the body of the stomach. No appreciable dilated gas-filled small bowel loops. Prominent colonic stool. No evidence of pneumatosis or pneumoperitoneum. No radiopaque nephrolithiasis. Intact lower sternotomy wires. IMPRESSION: Enteric tube terminates in the body of the stomach. No appreciable dilated gas-filled small bowel loops. Prominent colonic stool. Electronically Signed   By: Ilona Sorrel M.D.   On: 03/17/2020 08:52   DG Abd Portable 1V-Small Bowel Obstruction Protocol-initial, 8 hr delay  Result Date: 03/19/2020 CLINICAL DATA:  Small-bowel obstruction EXAM: PORTABLE ABDOMEN - 1 VIEW COMPARISON:  Mar 19, 2020 FINDINGS: There are persistent dilated loops of small bowel in the mid abdomen measuring up to approximately 3.7 cm in diameter. There is a large amount of stool in the right hemicolon. There is no pneumatosis. No free air. IMPRESSION: Persistent small bowel obstruction versus ileus. Large amount of stool in the right hemicolon. Electronically Signed  By: Constance Holster M.D.   On: 03/19/2020 20:03   DG Abd Portable 1V-Small Bowel Protocol-Position Verification  Result Date: 03/19/2020 CLINICAL DATA:  Small-bowel obstruction EXAM: PORTABLE ABDOMEN - 1 VIEW COMPARISON:  Portable exam 1053 hours compared to 03/17/2020 FINDINGS: Small amount gas in colon. Few prominent loops of small bowel in the mid abdomen. No bowel wall thickening identified. Subsegmental atelectasis at lung bases. Nasogastric tube projects over stomach. Bones demineralized. IMPRESSION: Few nonspecific dilated loops of small bowel in the mid abdomen. Electronically  Signed   By: Lavonia Dana M.D.   On: 03/19/2020 11:27    Orson Eva, DO  [facility]  If 7PM-7AM, please contact night-coverage [email or web address] Password [email or web address] 03/20/2020, 5:42 PM   LOS: 4 days

## 2020-03-20 NOTE — Plan of Care (Signed)
  Problem: Acute Rehab PT Goals(only PT should resolve) Goal: Pt Will Go Supine/Side To Sit Outcome: Progressing Flowsheets (Taken 03/20/2020 1530) Pt will go Supine/Side to Sit: with supervision Goal: Patient Will Transfer Sit To/From Stand Outcome: Progressing Flowsheets (Taken 03/20/2020 1530) Patient will transfer sit to/from stand: with min guard assist Goal: Pt Will Transfer Bed To Chair/Chair To Bed Outcome: Progressing Flowsheets (Taken 03/20/2020 1530) Pt will Transfer Bed to Chair/Chair to Bed: min guard assist Goal: Pt Will Ambulate Outcome: Progressing Flowsheets (Taken 03/20/2020 1530) Pt will Ambulate:  50 feet  with minimal assist  with min guard assist  with rolling walker   3:30 PM, 03/20/20 Lonell Grandchild, MPT Physical Therapist with [facility] 336 [phone] office [phone] mobile phone

## 2020-03-20 NOTE — Progress Notes (Signed)
MRSA swab tested positive. Standing orders placed for positive MRSA swab.

## 2020-03-20 NOTE — Progress Notes (Signed)
  Subjective: Patient asking more questions this morning.  Denies abdominal pain.  Objective: Vital signs in last 24 hours: Temp:  [97.9 F (36.6 C)-98.1 F (36.7 C)] 97.9 F (36.6 C) (05/05 0610) Pulse Rate:  [92-98] 98 (05/05 0610) Resp:  [19-20] 19 (05/05 0610) BP: (150-168)/(89-92) 168/92 (05/05 0610) SpO2:  [94 %] 94 % (05/05 0610) Last BM Date: 03/18/20  Intake/Output from previous day: 05/04 0701 - 05/05 0700 In: 550 [NG/GT:550] Out: 1350 [Urine:550; Emesis/NG output:800] Intake/Output this shift: No intake/output data recorded.  General appearance: alert, cooperative and no distress GI: soft, non-tender; bowel sounds normal; no masses,  no organomegaly  Lab Results:  Recent Labs    03/19/20 0430 03/20/20 0405  WBC 11.1* 8.9  HGB 12.3* 11.3*  HCT 41.1 37.2*  PLT 408* 362   BMET Recent Labs    03/19/20 0430 03/20/20 0405  NA 140 141  K 3.5 3.3*  CL 104 106  CO2 25 27  GLUCOSE 126* 106*  BUN 35* 31*  CREATININE 0.58* 0.57*  CALCIUM 8.3* 8.2*   PT/INR No results for input(s): LABPROT, INR in the last 72 hours.  Studies/Results: DG Abd Portable 1V-Small Bowel Obstruction Protocol-initial, 8 hr delay  Result Date: 03/19/2020 CLINICAL DATA:  Small-bowel obstruction EXAM: PORTABLE ABDOMEN - 1 VIEW COMPARISON:  Mar 19, 2020 FINDINGS: There are persistent dilated loops of small bowel in the mid abdomen measuring up to approximately 3.7 cm in diameter. There is a large amount of stool in the right hemicolon. There is no pneumatosis. No free air. IMPRESSION: Persistent small bowel obstruction versus ileus. Large amount of stool in the right hemicolon. Electronically Signed   By: Constance Holster M.D.   On: 03/19/2020 20:03   DG Abd Portable 1V-Small Bowel Protocol-Position Verification  Result Date: 03/19/2020 CLINICAL DATA:  Small-bowel obstruction EXAM: PORTABLE ABDOMEN - 1 VIEW COMPARISON:  Portable exam 1053 hours compared to 03/17/2020 FINDINGS: Small  amount gas in colon. Few prominent loops of small bowel in the mid abdomen. No bowel wall thickening identified. Subsegmental atelectasis at lung bases. Nasogastric tube projects over stomach. Bones demineralized. IMPRESSION: Few nonspecific dilated loops of small bowel in the mid abdomen. Electronically Signed   By: Lavonia Dana M.D.   On: 03/19/2020 11:27    Anti-infectives: Anti-infectives (From admission, onward)   None      Assessment/Plan: Impression: Partial small bowel obstruction versus ileus.  I do see contrast in the colon with a stool burden in the right colon.  NG tube output was clear which is improved. Plan: Will remove gastric tube and start full liquid diet.  Have also ordered an enema.  Discussed care with patient's daughter.  No need for surgical intervention at this time.  LOS: 4 days    Aviva Signs 03/20/2020

## 2020-03-20 NOTE — Consult Note (Signed)
Consultation Note Date: 03/20/20  Patient Name: Elijah Santana  DOB: [birth year]  MRN: [id number]  Age / Sex: 86 y.o., male  PCP: Glenda Chroman, MD Referring Physician: Orson Eva, MD  Reason for Consultation: Establishing goals of care  HPI/Patient Profile: 86 y.o. 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 CT abdomen/pelvis 5/1 revealed small bowel obstruction with transition point in the right abdomen likely secondary to adhesions. General surgery following. NGT removed on 5/5 and patient started on clear liquid diet. No indication for surgery. Palliative medicine consultation for goals of care.   Patient is followed by [name]. He is taking enzalutamide without side effects. Currently receiving palliative radiation. Receives Lupron injections from [name] and suprapubic cath will be changed once a month. Previous CT from 02/22/20 shows stable prostate mass however directly invading the anterior mid to low rectal wall. Bone mets in the spine and pelvic girdle stable. No adenopathy. Last PSA 0.05.    Clinical Assessment and Goals of Care:  I have reviewed medical records, discussed with care team and met patient at bedside. [name] is easily aroused but resting following multiple trips to [facility] following enema. He took sips of clears this afternoon. NGT out. He does not engage in conversation. Per RN at bedside, pain was rated 6 but 3 following tylenol administration. Patient's caregiver at bedside reports that daughter will be back at the hospital this afternoon.   This afternoon, follow-up with daughter Elijah Santana) in family waiting area to  discuss goals of care. Patient is currently sleeping and daughter prefers not to wake him. He did work with PT this afternoon and they are recommending SNF placement.   I introduced Palliative Medicine as specialized medical care for people living with serious illness. It focuses on providing relief from the symptoms and stress of a serious illness. The goal is to improve quality of life for both the patient and the family.  We discussed a brief life review of the patient. Elijah Santana is widowed. Daughter reports the death of her mother approximately one year ago and is familiar with palliative/hospice services. The patient has four children, two living daughters and two deceased sons. Daughter, Elijah Santana lives in [place] and daughter, [name] lives in [place]. 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  Prior to admission, patient living home with caregivers from 8am-9pm. Elijah Santana reports he is not the easiest person to get along with. Many sitters have quit and it has been challenging to find someone to stay at night. Elijah Santana reports worsening cognitive status, combativeness, and sundowning. Overall, she feels he has declined in the last 6 months and since the death of his wife (even though they had a love/hate relationship).   Discussed events leading up to admission and course  of hospitalization including diagnoses, interventions, plan of care, and surgery recommendations. We did discuss dementia component with sundowning and worsening cognitive status, based on vascular changes from head CT. Discussed oncology notes.   Attempted to elicit values and goals important to the patient and family. Elijah Santana is most worried about discharge plan. She knows her father will not be thrilled with the thought of SNF rehab but he is too weak to return home and does not have 24/7 caregivers.   Advanced directives, concepts specific to code status, artifical feeding and hydration were discussed. Patient has  a documented living will. Not on file. Elijah Santana reports that her sister [name] has his important documents. Elijah Santana does confirm DNR code status. Introduced and discussed MOST form. Elijah Santana plans to review with her father and sister.  Per Elijah Santana, the patient has been telling her he is "tired" and ready. Including tired of going to so many appointments. He was previously judgmental of his wife on hospice but has mentioned hospice to [facility]. The difference between aggressive medical intervention and comfort care was discussed including hospice philosophy and options. Elijah Santana is curious to further discuss this with her father when he is feeling better following this acute hospitalization, to see if his thoughts are still the same.   Questions and concerns were addressed.  Hard Choices booklet left for review. PMT contact information given.     SUMMARY OF RECOMMENDATIONS    Daughter (Elijah Santana) confirms DNR code status.   Continue medical management and current plan of care.   Patient has documented living will. Not on file. Requested but with other daughter living in [place].   Introduced and discussed MOST form. Elijah Santana to further discuss with patient and sister.   PT evaluation. Recommending SNF rehab. Daughter agreeable. Patient does not have 24/7 care at home.   Ongoing oncology follow-up and treatment pending clinical course.   Introduced hospice philosophy and options, as the patient as been telling his daughter he is tired. She plans to further discuss this with him.   Code Status/Advance Care Planning:  DNR  Symptom Management:   Per attending  Palliative Prophylaxis:   Aspiration, Delirium Protocol, Frequent Pain Assessment, Oral Care and Turn Reposition  Psycho-social/Spiritual:   Desire for further Chaplaincy support: yes  Additional Recommendations: Caregiving  Support/Resources, Compassionate Wean Education and Education on Hospice  Prognosis:   Poor long-term  prognosis  Discharge Planning: To Be Determined      Primary Diagnoses: Present on Admission: . Small bowel obstruction due to adhesions ([place]) . Paroxysmal atrial fibrillation (HCC) . Essential hypertension . GERD (gastroesophageal reflux disease) . Ischemic cardiomyopathy . Prostate cancer [facility])   I have reviewed the medical record, interviewed the patient and family, and examined the patient. The following aspects are pertinent.  Past Medical History:  Diagnosis Date  . Arthritis   . Asthma    Childhood  . Chronic back pain   . Coronary atherosclerosis of native coronary artery    a. CABG 2004 with LIMA-LAD, SVG-D1, SVG-OM, SVG-PDA. b. Cath ~2010 with occ of SVG-diagonal, distal LAD and OM filiing by collaterals. c. NSTEMI 12/2014 s/p DES to SVG-RCA. d. 11/2015: DES to distal graft of the SVG-distal RCA  . Essential hypertension   . Family history of brain cancer   . Family history of pancreatic cancer   . Foley catheter in place   . GERD (gastroesophageal reflux disease)   . Hard of hearing   . History of pneumonia   . Hyperglycemia   .  Ischemic cardiomyopathy    a. EF 45% by cath 12/2014.  . Mixed hyperlipidemia   . NSTEMI (non-ST elevated myocardial infarction) ([place]) 01/01/15  . Paroxysmal atrial fibrillation (HCC)   . Prostate cancer [facility])    Radiation therapy 1996  . Skin cancer   . Stroke [facility])    TIA- 28 years ago    Social History   Socioeconomic History  . Marital status: Widowed    Spouse name: Not on file  . Number of children: 2  . Years of education: Not on file  . Highest education level: Not on file  Occupational History  . Occupation: Retired    Comment: Office manager  Tobacco Use  . Smoking status: Former Smoker    Packs/day: 0.50    Years: 27.00    Pack years: 13.50    Types: Cigars    Start date: 07/28/1964    Quit date: 07/28/1986    Years since quitting: 33.6  . Smokeless tobacco: Former Systems developer    Types: Chew    Quit date:  08/07/2010  . Tobacco comment: never chewed up over a pack/day  Substance and Sexual Activity  . Alcohol use: No    Alcohol/week: 0.0 standard drinks  . Drug use: No  . Sexual activity: Not Currently  Other Topics Concern  . Not on file  Social History Narrative  . Not on file   Social Determinants of Health   Financial Resource Strain:   . Difficulty of Paying Living Expenses:   Food Insecurity:   . Worried About Charity fundraiser in the Last Year:   . Arboriculturist in the Last Year:   Transportation Needs:   . Film/video editor (Medical):   Marland Kitchen Lack of Transportation (Non-Medical):   Physical Activity:   . Days of Exercise per Week:   . Minutes of Exercise per Session:   Stress:   . Feeling of Stress :   Social Connections:   . Frequency of Communication with Friends and Family:   . Frequency of Social Gatherings with Friends and Family:   . Attends Religious Services:   . Active Member of Clubs or Organizations:   . Attends Archivist Meetings:   Marland Kitchen Marital Status:    Family History  Problem Relation Age of Onset  . Hypertension Father   . Coronary artery disease Father   . Pancreatic cancer Mother 38  . Brain cancer Sister 68  . Brain cancer Nephew        dx age 52, d. 47s   Scheduled Meds: . amLODipine  5 mg Per Tube q morning - 10a  . isosorbide mononitrate  60 mg Oral q morning - 10a  . metoprolol tartrate  7.5 mg Intravenous Q6H  . pantoprazole (PROTONIX) IV  40 mg Intravenous Q24H  . rosuvastatin  20 mg Per Tube QPM   Continuous Infusions: . lactated ringers 65 mL/hr at 03/19/20 1114   PRN Meds:.acetaminophen **OR** acetaminophen, bisacodyl, haloperidol lactate, hydrALAZINE, morphine injection, ondansetron Medications Prior to Admission:  Prior to Admission medications   Medication Sig Start Date End Date Taking? Authorizing Provider  amLODipine (NORVASC) 5 MG tablet TAKE ONE TABLET BY MOUTH DAILY (MORNING) Patient taking differently:  Take 5 mg by mouth every morning.  11/22/17  Yes Satira Sark, MD  calcium carbonate (OS-CAL - DOSED IN MG OF ELEMENTAL CALCIUM) 1250 (500 Ca) MG tablet Take 1 tablet by mouth daily.    Yes [provider]  clopidogrel (PLAVIX) 75 MG tablet TAKE ONE TABLET BY MOUTH EVERY DAY (,EVENING) Patient taking differently: Take 75 mg by mouth daily.  02/05/20  Yes Satira Sark, MD  denosumab (XGEVA) 120 MG/1.7ML SOLN injection Inject 120 mg into the skin once.    Yes [provider]  ELIQUIS 5 MG TABS tablet TAKE ONE TABLET BY MOUTH TWICE DAILY. (MORNING ,EVENING) Patient taking differently: Take 5 mg by mouth 2 (two) times daily.  12/11/19  Yes Satira Sark, MD  HYDROcodone-acetaminophen (NORCO) 5-325 MG tablet Take 1 tablet by mouth at bedtime as needed for moderate pain. 02/26/20  Yes Derek Jack, MD  isosorbide mononitrate (IMDUR) 60 MG 24 hr tablet TAKE ONE TABLET BY MOUTH DAILY (MORNING) Patient taking differently: Take 60 mg by mouth every morning.  06/23/19  Yes Satira Sark, MD  Leuprolide Acetate (LUPRON IJ) Inject as directed every 4 (four) months.    Yes [provider]  linaclotide Rolan Lipa) 290 MCG CAPS capsule Take 1 capsule (290 mcg total) by mouth daily before breakfast. 11/15/19  Yes Laurine Blazer A, PA-C  metoprolol tartrate (LOPRESSOR) 50 MG tablet Take 1 tablet (50 mg total) by mouth 2 (two) times daily. 06/23/19  Yes Satira Sark, MD  pantoprazole (PROTONIX) 40 MG tablet Take 40 mg by mouth every morning. 09/19/19  Yes [provider]  rosuvastatin (CRESTOR) 20 MG tablet Take 1 tablet (20 mg total) by mouth every evening. 06/23/19  Yes Satira Sark, MD  STOOL SOFTENER 100 MG capsule Take 100-200 mg by mouth daily as needed for mild constipation.  01/02/19  Yes [provider]  TOVIAZ 4 MG TB24 tablet Take 4 mg by mouth daily. 10/02/19  Yes [provider]  traMADol (ULTRAM) 50 MG tablet Take 2 tablets  (100 mg total) by mouth every 6 (six) hours as needed (for pain). 03/05/20  Yes Derek Jack, MD  XTANDI 40 MG capsule TAKE 4 CAPSULES (160 MG TOTAL) BY MOUTH DAILY. Patient taking differently: Take 160 mg by mouth daily.  12/05/19  Yes Derek Jack, MD  acetaminophen (TYLENOL) 325 MG tablet Take 2 tablets (650 mg total) by mouth every 4 (four) hours as needed for headache or mild pain. Patient not taking: Reported on 01/22/2020 09/12/16   Erlene Quan, PA-C  nitroGLYCERIN (NITROSTAT) 0.4 MG SL tablet DISSOLVE ONE TABLET UNDER TONGUE EVERY 5 MINUTES UP TO 3 DOSES AS NEEDED FOR CHEST PAIN Patient not taking: Reported on 02/26/2020 03/27/19   Satira Sark, MD   No Known Allergies Review of Systems  Unable to perform ROS: Acuity of condition    Physical Exam Vitals and nursing note reviewed.  Constitutional:      Appearance: He is ill-appearing.  HENT:     Head: Normocephalic and atraumatic.  Pulmonary:     Effort: No tachypnea, accessory muscle usage or respiratory distress.  Skin:    General: Skin is warm and dry.     Coloration: Skin is pale.  Neurological:     Mental Status: He is alert, oriented to person, place, and time and easily aroused.     Comments: Periods of intermittent confusion/sundowning  Psychiatric:        Attention and Perception: He is inattentive.    Vital Signs: BP (!) 168/92 (BP Location: Right Arm)   Pulse 98   Temp 97.9 F (36.6 C)   Resp 19   Ht 5' 8"  (1.727 m)   Wt 70.4 kg  SpO2 94%   BMI 23.60 kg/m  Pain Scale: 0-10 POSS *See Group Information*: S-Acceptable,Sleep, easy to arouse Pain Score: 3    SpO2: SpO2: 94 % O2 Device:SpO2: 94 % O2 Flow Rate: .   IO: Intake/output summary:   Intake/Output Summary (Last 24 hours) at 03/20/2020 1308 Last data filed at 03/20/2020 0500 Gross per 24 hour  Intake 150 ml  Output 1350 ml  Net -1200 ml    LBM: Last BM Date: 03/18/20 Baseline Weight: Weight: 70.4 kg Most recent weight:  Weight: 70.4 kg     Palliative Assessment/Data: PPS 40%     Time In/Out: 1255-1305, 6349-4944 Time Total: 70 Greater than 50%  of this time was spent counseling and coordinating care related to the above assessment and plan.  Signed by:  Ihor Dow, DNP, FNP-C Palliative Medicine Team  Phone: [phone] Fax: [phone]   Please contact Palliative Medicine Team phone at [phone] for questions and concerns.  For individual provider: See Shea Evans

## 2020-03-20 NOTE — Evaluation (Signed)
Physical Therapy Evaluation Patient Details Name: Elijah Santana MRN: [id number] DOB: [birth year] Today's Date: 03/20/2020   History of Present Illness  Elijah Santana  is a 86 y.o. 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  Patient is receiving chemotherapy and radiation with last treatment yesterday.  Patient has been feeling bloating.  Patient completed his 4 rounds of radiation over the course of the past 4 days.  Patient has become more fatigued and weak since he got radiation treatments.    Clinical Impression  Patient limited for functional mobility as stated below secondary to BLE weakness, fatigue and poor standing balance.  Patient limited to ambulation in room due to c/o fatigue, unable to fully extend trunk secondary to fatigue and put back to bed after therapy with his daughter present in room - RN aware.  Patient will benefit from continued physical therapy in hospital and recommended venue below to increase strength, balance, endurance for safe ADLs and gait.     Follow Up Recommendations SNF    Equipment Recommendations  None recommended by PT    Recommendations for Other Services       Precautions / Restrictions Precautions Precautions: Fall Restrictions Weight Bearing Restrictions: No      Mobility  Bed Mobility Overal bed mobility: Needs Assistance Bed Mobility: Supine to Sit;Sit to Supine     Supine to sit: Min guard Sit to supine: Min guard   General bed mobility comments: increased time, labored movement  Transfers Overall transfer level: Needs assistance Equipment used: Rolling walker (2 wheeled) Transfers: Sit to/from [facility] Sit to Stand: Min assist Stand pivot transfers: Min assist       General transfer comment: labord movement with increased time, flexed trunk  Ambulation/Gait Ambulation/Gait assistance: Min assist;Mod assist Gait Distance (Feet): 18 Feet Assistive device: Rolling walker (2 wheeled) Gait Pattern/deviations: Decreased step length - right;Decreased step length - left;Decreased stride length;Trunk flexed Gait velocity: decreased   General Gait Details: slow labored cadence with flexed trunk, unable to keep trunk fully extended due to weakness, limited secondary to fatigue  Stairs            Wheelchair Mobility    Modified Rankin (Stroke Patients Only)       Balance Overall balance assessment: Needs assistance Sitting-balance support: Feet supported;No upper extremity supported Sitting balance-Leahy Scale: Fair Sitting balance - Comments: fair/good seated at EOB   Standing balance support: During functional activity;Bilateral upper extremity supported Standing balance-Leahy Scale: Fair Standing balance comment: using RW                             Pertinent Vitals/Pain Pain Assessment: Faces Faces Pain Scale: Hurts a little bit Pain Location: stomach Pain Descriptors / Indicators: Aching Pain Intervention(s): Limited activity within patient's tolerance;Monitored during session    [place] expects to be discharged to:: Private residence Living Arrangements: Alone Available Help at Discharge: Personal care attendant;Available PRN/intermittently Type of Home: House Home Access: Ramped entrance     Home Layout: One level;Able to live on main level with bedroom/bathroom;Laundry or work area in basement;Other (Comment)(patient does not go downstairs) Home Equipment: Walker - 2 wheels;Wheelchair - manual      Prior Function Level of Independence: Needs assistance   Gait /  Transfers Assistance Needed: household ambulator using RW  ADL's / Homemaking Assistance Needed: home aides from 8 am - 9 pm x  7 days/week, no help at night        Hand Dominance        Extremity/Trunk Assessment   Upper Extremity Assessment Upper Extremity Assessment: Generalized weakness    Lower Extremity Assessment Lower Extremity Assessment: Generalized weakness    Cervical / Trunk Assessment Cervical / Trunk Assessment: Kyphotic  Communication   Communication: No difficulties  Cognition Arousal/Alertness: Awake/alert Behavior During Therapy: WFL for tasks assessed/performed Overall Cognitive Status: History of cognitive impairments - at baseline                                        General Comments      Exercises     Assessment/Plan    PT Assessment Patient needs continued PT services  PT Problem List Decreased strength;Decreased activity tolerance;Decreased balance;Decreased mobility       PT Treatment Interventions Gait training;Stair training;Patient/family education;Functional mobility training;Therapeutic activities;Therapeutic exercise    PT Goals (Current goals can be found in the Care Plan section)  Acute Rehab PT Goals Patient Stated Goal: return home with assistance PT Goal Formulation: With patient/family Time For Goal Achievement: 04/03/20 Potential to Achieve Goals: Good    Frequency Min 3X/week   Barriers to discharge        Co-evaluation               AM-PAC PT "6 Clicks" Mobility  Outcome Measure Help needed turning from your back to your side while in a flat bed without using bedrails?: A Little Help needed moving from lying on your back to sitting on the side of a flat bed without using bedrails?: A Little Help needed moving to and from a bed to a chair (including a wheelchair)?: A Lot Help needed standing up from a chair using your arms (e.g., wheelchair or bedside chair)?: A Little Help needed to walk in hospital room?: A Lot Help needed climbing 3-5 steps with a railing? : A Lot 6 Click Score: 15    End of Session  Equipment Utilized During Treatment: Gait belt Activity Tolerance: Patient tolerated treatment well;Patient limited by fatigue Patient left: in bed;with call bell/phone within reach;with family/visitor present;with nursing/sitter in room Nurse Communication: Mobility status PT Visit Diagnosis: Unsteadiness on feet (R26.81);Other abnormalities of gait and mobility (R26.89);Muscle weakness (generalized) (M62.81)    Time: [id number] PT Time Calculation (min) (ACUTE ONLY): 25 min   Charges:   PT Evaluation $PT Eval Moderate Complexity: 1 Mod PT Treatments $Therapeutic Activity: 23-37 mins        3:28 PM, 03/20/20 Lonell Grandchild, MPT Physical Therapist with [facility] [phone] office [phone] mobile phone

## 2020-03-20 NOTE — Care Management Important Message (Signed)
Important Message  Patient Details  Name: Elijah Santana MRN: [id number] Date of Birth: 11-13-1932   Medicare Important Message Given:  Yes     [name] 03/20/2020, 3:02 PM

## 2020-03-21 ENCOUNTER — Ambulatory Visit: Payer: Medicare HMO

## 2020-03-21 ENCOUNTER — Telehealth: Payer: Self-pay

## 2020-03-21 DIAGNOSIS — Z515 Encounter for palliative care: Secondary | ICD-10-CM

## 2020-03-21 DIAGNOSIS — Z7189 Other specified counseling: Secondary | ICD-10-CM

## 2020-03-21 LAB — COMPREHENSIVE METABOLIC PANEL
ALT: 20 U/L (ref 0–44)
AST: 24 U/L (ref 15–41)
Albumin: 2.3 g/dL — ABNORMAL LOW (ref 3.5–5.0)
Alkaline Phosphatase: 45 U/L (ref 38–126)
Anion gap: 8 (ref 5–15)
BUN: 26 mg/dL — ABNORMAL HIGH (ref 8–23)
CO2: 28 mmol/L (ref 22–32)
Calcium: 8 mg/dL — ABNORMAL LOW (ref 8.9–10.3)
Chloride: 105 mmol/L (ref 98–111)
Creatinine, Ser: 0.53 mg/dL — ABNORMAL LOW (ref 0.61–1.24)
GFR calc Af Amer: >60 mL/min (ref >60)
GFR calc non Af Amer: >60 mL/min (ref >60)
Glucose, Bld: 101 mg/dL — ABNORMAL HIGH (ref 70–99)
Potassium: 3.6 mmol/L (ref 3.5–5.1)
Sodium: 141 mmol/L (ref 135–145)
Total Bilirubin: 1.1 mg/dL (ref 0.3–1.2)
Total Protein: 5.2 g/dL — ABNORMAL LOW (ref 6.5–8.1)

## 2020-03-21 LAB — MAGNESIUM: Magnesium: 2.2 mg/dL (ref 1.7–2.4)

## 2020-03-21 MED ORDER — TRAMADOL HCL 50 MG PO TABS
75.0000 mg | ORAL_TABLET | Freq: Four times a day (QID) | ORAL | Status: DC | PRN
  Administered 2020-03-21 – 2020-03-22 (×4): 75 mg via ORAL
  Filled 2020-03-21 (×4): qty 2

## 2020-03-21 MED ORDER — POLYETHYLENE GLYCOL 3350 17 G PO PACK
17.0000 g | PACK | Freq: Every day | ORAL | Status: DC
  Administered 2020-03-21 – 2020-03-22 (×2): 17 g via ORAL
  Filled 2020-03-21 (×2): qty 1

## 2020-03-21 MED ORDER — POLYETHYLENE GLYCOL 3350 17 G PO PACK: 17.0000 g | PACK | Freq: Every day | ORAL | 0 refills | Status: AC

## 2020-03-21 NOTE — Telephone Encounter (Signed)
Received call from [place] concerning pts last lupron injection. Last injection date given and noted that last 3 injection appts. were canceled due to weather and pt. Illness. New OV w/lupron inj  appt made and given to MD to relay to patient and patient family.

## 2020-03-21 NOTE — TOC Progression Note (Addendum)
Transition of Care [facility]) - Progression Note    Patient Details  Name: Elijah Santana MRN: [id number] Date of Birth: 1932-04-20  [facility] CM/SW Contact  Ronelle Michie, Chauncey Reading, RN Phone Number: 03/21/2020, 12:05 PM  Clinical Narrative:   Discussed SNF recommendation with daughter, patient was sleeping. [name], daughter at bedside lives in [place]. Patient's other daughter lives near [place], 3 hours away. Patient has a caregiver during the day but not at night. Daughter is agreeable to SNF. Discussed sending out to all local facilities.     Expected Discharge Plan: [place] Barriers to Discharge: Continued Medical Work up  Expected Discharge Plan and Services Expected Discharge Plan: [place] arrangements for the past 2 months: [place] Determinants of Health (SDOH) Interventions    Readmission Risk Interventions Readmission Risk Prevention Plan 03/19/2020  Transportation Screening Complete  Medication Review Press photographer) Complete  PCP or Specialist appointment within 3-5 days of discharge Not Complete  HRI or Home Care Consult Complete  SW Recovery Care/Counseling Consult Complete  Palliative Care Screening Not Applicable  Skilled Nursing Facility Complete  Some recent data might be hidden

## 2020-03-21 NOTE — Progress Notes (Signed)
Daily Progress Note   Patient Name: Elijah Santana       Date: 03/21/2020 DOB: Feb 18, 1932  Age: 86 y.o. MRN#: [id number] Attending Physician: Orson Eva, MD Primary Care Physician: Glenda Chroman, MD Admit Date: 03/16/2020  Reason for Consultation/Follow-up: Establishing goals of care  Subjective/GOC: Patient awake, alert, oriented and in good spirits this morning. He is complaining of mild abdominal pain and requesting tylenol. RN notified. He is tolerating clears.  Daughter, [name] at bedside. Introduced role of palliative medicine again.   Reviewed course of hospitalization including diagnoses, interventions, plan of care.   Discussed in detail PT recommendation for SNF rehab placement before he discharges home. After long discussion by daughter and I, patient has agreed to SNF rehab placement and is determined to work with therapy with hopes of getting stronger. If he is able to get stronger at rehab, he plans to follow-up with oncology outpatient.   Encouraged ongoing discussions regarding his wishes while he is of sound mind. Patient does open up in regards to not being fearful of death and that he is 86 years old. He states "I would rather be in heaven than suffering here." [name] and I explain that at any point, he can discontinue oncology interventions and shift to comfort if tired and ready.   Following rehab, patient is hopeful to return home, have comfort and symptom management control, the ability to tolerate short walks, and have decent food. [name] reassured ongoing support from her and sister, [name]. Also emphasized that they will continue to search for HS caregivers.  [name] plans to discuss MOST form with her father at a different time.   Answered questions. PMT contact information  given.    Length of Stay: 5  Current Medications: Scheduled Meds:  . amLODipine  5 mg Per Tube q morning - 10a  . Chlorhexidine Gluconate Cloth  6 each Topical Q0600  . isosorbide mononitrate  60 mg Oral q morning - 10a  . metoprolol tartrate  7.5 mg Intravenous Q6H  . mupirocin ointment  1 application Nasal BID  . pantoprazole (PROTONIX) IV  40 mg Intravenous Q24H  . polyethylene glycol  17 g Oral Daily  . potassium chloride  40 mEq Oral Daily  . rosuvastatin  20 mg Per Tube QPM    Continuous Infusions: . lactated  ringers 65 mL/hr at 03/20/20 1945    PRN Meds: acetaminophen **OR** acetaminophen, bisacodyl, haloperidol lactate, hydrALAZINE, ondansetron, traMADol  Physical Exam Vitals and nursing note reviewed.  Constitutional:      General: He is awake.     Appearance: He is ill-appearing.  HENT:     Head: Normocephalic and atraumatic.  Pulmonary:     Effort: No tachypnea, accessory muscle usage or respiratory distress.  Abdominal:     Tenderness: There is abdominal tenderness.     Comments: Requesting tylenol  Skin:    General: Skin is warm and dry.     Coloration: Skin is pale.  Neurological:     Mental Status: He is alert and oriented to person, place, and time.     Comments: Periods of pleasant confusion with mild dementia. Able to fully participate in conversation this morning.   Psychiatric:        Mood and Affect: Mood normal.        Speech: Speech normal.        Cognition and Memory: Cognition normal.            Vital Signs: BP (!) 157/84 (BP Location: Right Arm)   Pulse 81   Temp 98.9 F (37.2 C) (Oral)   Resp 20   Ht 5\' 8"  (1.727 m)   Wt 70.4 kg   SpO2 96%   BMI 23.60 kg/m  SpO2: SpO2: 96 % O2 Device: O2 Device: Room Air O2 Flow Rate:    Intake/output summary:   Intake/Output Summary (Last 24 hours) at 03/21/2020 1024 Last data filed at 03/21/2020 0500 Gross per 24 hour  Intake --  Output 400 ml  Net -400 ml   LBM: Last BM Date:  03/20/20 Baseline Weight: Weight: 70.4 kg Most recent weight: Weight: 70.4 kg       Palliative Assessment/Data: PPS 50%      Patient Active Problem List   Diagnosis Date Noted  . Paroxysmal atrial fibrillation ([place]) 03/17/2020  . Suprapubic catheter ([place]) 03/17/2020  . Small bowel obstruction due to adhesions ([place]) 03/16/2020  . Genetic testing 10/25/2019  . Family history of pancreatic cancer   . Family history of brain cancer   . Rectal pain 08/08/2019  . Lower abdominal pain 08/08/2019  . Urinary retention 03/24/2019  . Bilateral hydronephrosis 03/24/2019  . Uncontrolled hypertension 03/24/2019  . Acute lower UTI 03/24/2019  . Anemia 03/24/2019  . Bone metastasis ([place]) 04/08/2018  . Prostate cancer ([place]) 11/27/2017  . Atrial fibrillation, rapid -new 09/10/16 09/10/2016  . PAF- in setting of NSTEMI 09/10/2016  . Accelerating angina ([place])   . CAD S/P percutaneous coronary angioplasty   . Coronary artery disease with hx of myocardial infarct w/o hx of CABG 03/21/2015  . Ischemic cardiomyopathy   . Hyperglycemia   . Essential hypertension   . BPH (benign prostatic hyperplasia)   . NSTEMI- Troponin peak 6/47 12/31/2014  . S/P CABG x 4 2004 12/31/2014  . GERD (gastroesophageal reflux disease) 04/29/2012  . Mixed hyperlipidemia   . Essential hypertension, benign 08/06/2009    Palliative Care Assessment & Plan   Patient Profile: 86 y.o. 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 CT abdomen/pelvis 5/1 revealed small bowel obstruction with transition point in the right abdomen likely  secondary to adhesions. General surgery following. NGT removed on 5/5 and patient started on clear liquid diet. No indication  for surgery. Palliative medicine consultation for goals of care.   Patient is followed by [name]. He is taking enzalutamide without side effects. Currently receiving palliative radiation. Receives Lupron injections from [name] and suprapubic cath will be changed once a month. Previous CT from 02/22/20 shows stable prostate mass however directly invading the anterior mid to low rectal wall. Bone mets in the spine and pelvic girdle stable. No adenopathy. Last PSA 0.05.   Assessment: Partial small bowel obstruction Metastatic prostate cancer DM type 2 Hx CAD, HLD Essential hypertension  Recommendations/Plan:  Continue medical management and current plan of care.   Patient/daughter agree with SNF rehab on discharge. Patient hopeful and determined to get stronger at rehab. Hopeful to return home following rehab progression.   Outpatient oncology follow-up.   Daughter to further discuss MOST form with the patient.   Outpatient palliative referral.    Code Status: DNR   Code Status Orders  (From admission, onward)         Start     Ordered   03/16/20 2154  Do not attempt resuscitation (DNR)  Continuous    Question Answer Comment  In the event of cardiac or respiratory ARREST Do not call a "code blue"   In the event of cardiac or respiratory ARREST Do not perform Intubation, CPR, defibrillation or ACLS   In the event of cardiac or respiratory ARREST Use medication by any route, position, wound care, and other measures to relive pain and suffering. May use oxygen, suction and manual treatment of airway obstruction as needed for comfort.      03/16/20 2153        Code Status History    Date Active Date Inactive Code Status Order ID Comments User Context   03/24/2019 0340 03/25/2019 1506 Full Code [id number]  Jani Gravel, MD ED   09/10/2016 2300 09/12/2016 1456 Full Code [id number]  Fay Records, MD Inpatient   11/21/2015 0949 11/22/2015 1545 Full Code [id number]  Troy Sine,  MD Inpatient   01/01/2015 1405 01/02/2015 1401 Full Code [id number]  Martin[place], Peter M, MD Inpatient   12/31/2014 1606 01/01/2015 1405 Full Code [id number]  Crista Luria Inpatient   12/30/2014 2051 12/31/2014 1606 Full Code [id number]  Doree Albee, MD ED   04/29/2012 0255 04/29/2012 2121 Full Code [id number]  Godfrey Pick, RN Inpatient   Advance Care Planning Activity    Advance Directive Documentation     Most Recent Value  Type of Advance Directive  Healthcare Power of Attorney  Pre-existing out of facility DNR order (yellow form or pink MOST form)  --  "MOST" Form in Place?  --       Prognosis:   Unable to determine  Discharge Planning:  [place] for rehab with Palliative care service follow-up  Care plan was discussed with RN, patient, daughter, Updated Dr. Carles Collet via secure chat  Thank you for allowing the Palliative Medicine Team to assist in the care of this patient.   Time In: 0945 Time Out: 1020 Total Time 35 Prolonged Time Billed  no      Greater than 50%  of this time was spent counseling and coordinating care related to the above assessment and plan.  Ihor Dow, DNP, FNP-C Palliative Medicine Team  Phone: [phone] Fax: [phone]  Please contact Palliative Medicine Team phone at [phone] for questions  and concerns.

## 2020-03-21 NOTE — NC FL2 (Signed)
[place] LEVEL OF CARE SCREENING TOOL     IDENTIFICATION  Patient Name: Elijah Santana Birthdate: [birth year] Sex: male Admission Date (Current Location): 03/16/2020  [facility] [place] Number:  [employer] and Address:  [facility] [address] [place]      Provider Number: [id number]  Attending Physician Name and Address:  Orson Eva, MD  Relative Name and Phone Number:  Delene Ruffini - daughter [phone]    Current Level of Care: Hospital Recommended Level of Care: [facility] Prior Approval Number:    Date Approved/Denied:   PASRR Number: [id number]  Discharge Plan: SNF    Current Diagnoses: Patient Active Problem List   Diagnosis Date Noted  . Paroxysmal atrial fibrillation ([place]) 03/17/2020  . Suprapubic catheter ([place]) 03/17/2020  . Small bowel obstruction due to adhesions ([place]) 03/16/2020  . Genetic testing 10/25/2019  . Family history of pancreatic cancer   . Family history of brain cancer   . Rectal pain 08/08/2019  . Lower abdominal pain 08/08/2019  . Urinary retention 03/24/2019  . Bilateral hydronephrosis 03/24/2019  . Uncontrolled hypertension 03/24/2019  . Acute lower UTI 03/24/2019  . Anemia 03/24/2019  . Bone metastasis ([place]) 04/08/2018  . Prostate cancer ([place]) 11/27/2017  . Atrial fibrillation, rapid -new 09/10/16 09/10/2016  . PAF- in setting of NSTEMI 09/10/2016  . Accelerating angina ([place])   . CAD S/P percutaneous coronary angioplasty   . Coronary artery disease with hx of myocardial infarct w/o hx of CABG 03/21/2015  . Ischemic cardiomyopathy   . Hyperglycemia   . Essential hypertension   . BPH (benign prostatic hyperplasia)   . NSTEMI- Troponin peak 6/47 12/31/2014  . S/P CABG x 4 2004 12/31/2014  . GERD (gastroesophageal reflux disease) 04/29/2012  . Mixed hyperlipidemia   . Essential hypertension, benign 08/06/2009    Orientation RESPIRATION BLADDER Height & Weight      Self, Place  Normal Incontinent Weight: 155 lb 3.3 oz (70.4 kg) Height:  5\' 8"  (172.7 cm)  BEHAVIORAL SYMPTOMS/MOOD NEUROLOGICAL BOWEL NUTRITION STATUS      Continent Diet(See DC Summary)  AMBULATORY STATUS COMMUNICATION OF NEEDS Skin   Extensive Assist Verbally Other (Comment)(Skin Tear)                       Personal Care Assistance Level of Assistance  Bathing, Feeding, Dressing Bathing Assistance: Limited assistance Feeding assistance: Independent Dressing Assistance: Limited assistance     Functional Limitations Info  Sight, Hearing, Speech Sight Info: Impaired Hearing Info: Impaired Speech Info: Adequate    SPECIAL CARE FACTORS FREQUENCY  PT (By licensed PT)     PT Frequency: 5 Times a week              Contractures Contractures Info: Present    Additional Factors Info  Code Status, Allergies Code Status Info: DNR Allergies Info: NKDA           Current Medications (03/21/2020):  This is the current hospital active medication list Current Facility-Administered Medications  Medication Dose Route Frequency Provider Last Rate Last Admin  . acetaminophen (TYLENOL) tablet 650 mg  650 mg Oral Q6H PRN Oswald Hillock, MD   650 mg at 03/21/20 1022   Or  . acetaminophen (TYLENOL) suppository 650 mg  650 mg Rectal Q6H PRN Oswald Hillock, MD      . amLODipine (NORVASC) tablet 5 mg  5 mg Per Tube q morning - 10a  Johnson, Clanford L, MD   5 mg at 03/21/20 1022  . bisacodyl (DULCOLAX) suppository 10 mg  10 mg Rectal Daily PRN Aviva Signs, MD      . Chlorhexidine Gluconate Cloth 2 % PADS 6 each  6 each Topical Q0600 Tat, David, MD   6 each at 03/21/20 0550  . haloperidol lactate (HALDOL) injection 2 mg  2 mg Intravenous Q6H PRN Wynetta Emery, Clanford L, MD   2 mg at 03/18/20 0003  . hydrALAZINE (APRESOLINE) injection 10 mg  10 mg Intravenous Q4H PRN Wynetta Emery, Clanford L, MD   10 mg at 03/17/20 1502  . isosorbide mononitrate (IMDUR) 24 hr tablet 60 mg  60 mg Oral q  morning - 10a Johnson, Clanford L, MD   60 mg at 03/21/20 1022  . lactated ringers infusion   Intravenous Continuous Irwin Brakeman L, MD 65 mL/hr at 03/20/20 1945 New Bag at 03/20/20 1945  . metoprolol tartrate (LOPRESSOR) injection 7.5 mg  7.5 mg Intravenous Q6H Johnson, Clanford L, MD   7.5 mg at 03/21/20 0550  . mupirocin ointment (BACTROBAN) 2 % 1 application  1 application Nasal BID Tat, Shanon Brow, MD   1 application at [id number] [phone]  . ondansetron (ZOFRAN) injection 4 mg  4 mg Intravenous Q6H PRN Oswald Hillock, MD   4 mg at 03/20/20 1240  . pantoprazole (PROTONIX) injection 40 mg  40 mg Intravenous Q24H Johnson, Clanford L, MD   40 mg at 03/20/20 1429  . polyethylene glycol (MIRALAX / GLYCOLAX) packet 17 g  17 g Oral Daily Aviva Signs, MD   17 g at 03/21/20 1022  . potassium chloride 20 MEQ/15ML (10%) solution 40 mEq  40 mEq Oral Daily Tat, David, MD   40 mEq at 03/21/20 1022  . rosuvastatin (CRESTOR) tablet 20 mg  20 mg Per Tube QPM Johnson, Clanford L, MD   20 mg at 03/20/20 1735  . traMADol (ULTRAM) tablet 75 mg  75 mg Oral Q6H PRN Basilio Cairo, NP         Discharge Medications: Please see discharge summary for a list of discharge medications.  Relevant Imaging Results:  Relevant Lab Results:   Additional Information SS# [id number]  Shade Flood, LCSW

## 2020-03-21 NOTE — TOC Progression Note (Signed)
Transition of Care [facility]) - Progression Note    Patient Details  Name: JAVONNI FRISBIE MRN: [id number] Date of Birth: 05/09/1932  Transition of Care [facility]) CM/SW Contact  Sevin Langenbach, Chauncey Reading, RN Phone Number: 03/21/2020, 3:40 PM  Clinical Narrative:   Discussed bed offers with daughter and patient. Discussed CMS star ratings of all offers. Patient elects [place]. Gerald Stabs of [place] center notified and will start insurance authorization.   Patient has had the Moderna vaccine. Picture of card sent to [facility].     Expected Discharge Plan: [place] Barriers to Discharge: Continued Medical Work up  Expected Discharge Plan and Services Expected Discharge Plan: [place] arrangements for the past 2 months: [place] Determinants of Health (SDOH) Interventions    Readmission Risk Interventions Readmission Risk Prevention Plan 03/19/2020  Transportation Screening Complete  Medication Review [employer]) Complete  PCP or Specialist appointment within 3-5 days of discharge Not Complete  HRI or Home Care Consult Complete  SW Recovery Care/Counseling Consult Complete  Palliative Care Screening Not Applicable  Skilled Nursing Facility Complete  Some recent data might be hidden

## 2020-03-21 NOTE — Progress Notes (Signed)
  Subjective: Resting comfortably, no complaints.  Objective: Vital signs in last 24 hours: Temp:  [98.4 F (36.9 C)-99.2 F (37.3 C)] 98.9 F (37.2 C) (05/06 0448) Pulse Rate:  [79-86] 81 (05/06 0448) Resp:  [17-20] 20 (05/06 0448) BP: (133-157)/(74-86) 157/84 (05/06 0448) SpO2:  [91 %-96 %] 96 % (05/06 0448) Last BM Date: 03/20/20  Intake/Output from previous day: 05/05 0701 - 05/06 0700 In: -  Out: 400 [Urine:400] Intake/Output this shift: No intake/output data recorded.  General appearance: cooperative and no distress GI: soft, non-tender; bowel sounds normal; no masses,  no organomegaly  Lab Results:  Recent Labs    03/19/20 0430 03/20/20 0405  WBC 11.1* 8.9  HGB 12.3* 11.3*  HCT 41.1 37.2*  PLT 408* 362   BMET Recent Labs    03/20/20 0405 03/21/20 0427  NA 141 141  K 3.3* 3.6  CL 106 105  CO2 27 28  GLUCOSE 106* 101*  BUN 31* 26*  CREATININE 0.57* 0.53*  CALCIUM 8.2* 8.0*   PT/INR No results for input(s): LABPROT, INR in the last 72 hours.  Studies/Results: DG Abd Portable 1V-Small Bowel Obstruction Protocol-initial, 8 hr delay  Result Date: 03/19/2020 CLINICAL DATA:  Small-bowel obstruction EXAM: PORTABLE ABDOMEN - 1 VIEW COMPARISON:  Mar 19, 2020 FINDINGS: There are persistent dilated loops of small bowel in the mid abdomen measuring up to approximately 3.7 cm in diameter. There is a large amount of stool in the right hemicolon. There is no pneumatosis. No free air. IMPRESSION: Persistent small bowel obstruction versus ileus. Large amount of stool in the right hemicolon. Electronically Signed   By: Constance Holster M.D.   On: 03/19/2020 20:03   DG Abd Portable 1V-Small Bowel Protocol-Position Verification  Result Date: 03/19/2020 CLINICAL DATA:  Small-bowel obstruction EXAM: PORTABLE ABDOMEN - 1 VIEW COMPARISON:  Portable exam 1053 hours compared to 03/17/2020 FINDINGS: Small amount gas in colon. Few prominent loops of small bowel in the mid  abdomen. No bowel wall thickening identified. Subsegmental atelectasis at lung bases. Nasogastric tube projects over stomach. Bones demineralized. IMPRESSION: Few nonspecific dilated loops of small bowel in the mid abdomen. Electronically Signed   By: Lavonia Dana M.D.   On: 03/19/2020 11:27    Anti-infectives: Anti-infectives (From admission, onward)   None      Assessment/Plan: Impression: Bowel dysfunction seems to have resolved.  There is no indication for exploratory laparotomy at the present time.  Will advance to soft diet.  MiraLAX daily.  Palliative care is involved.  Will follow peripherally with you.  LOS: 5 days    Aviva Signs 03/21/2020

## 2020-03-21 NOTE — Progress Notes (Signed)
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 he just finished 4 rounds of radiation over the course of the past 4 days. Evidently the most recent CT demonstrated that the mass is unchanged, however shifted closer to the rectal wall which they suspect is causing his symptoms ofabdominal pain.When he received radiation in Fall of 2020, he respondedvery well, however this time he is becoming fatigued and weak. Patient takes tramadol for his symptoms of pain, which she suspects may be a contributing factor. His oncologist, [name], reducedhis tramadol from 100 mg every 6 hours to 75 mg every 6 hours due to his reported weakness. He has been complaining of nausea symptoms to the caregivers as well and they note that he has been dry heaving.  CT of the abdomen and pelvis on 03/16/2020 showing small bowel obstruction with a transition point in the right abdomen likely secondary to adhesions.  There is moderate stool in the transverse colon and sigmoid colon.  There was chronic diffuse bladder wall thickening.  General surgery was consulted to assist with management   Assessment/Plan: Partial small bowel obstruction -Appreciate general surgery -03/20/20--NG tube removed, diet advanced -Case discussed with general surgery, Dr. Arnoldo Morale -03/21/20--patient had 4 BMs in last 24 hours -Continue IV fluids -03/21/20--diet advanced to soft diet  Essential hypertension -Continue IV Lopressor until the patient is able to tolerate p.o.  reliably -Continue amlodipine  Coronary artery disease -No chest pain presently -Continue isosorbide  Hyperlipidemia -Continue statin  Diabetes mellitus type 2 -Holding metformin -CBGs controlled  Metastatic prostate cancer -Patient finished 4 days of radiation prior to admission CTAP from 02/22/2020 which shows prostatic mass stable at 5.7 x 4.7 cm. However appears to increasingly directly invade the anterior mid to lower rectal wall. Bone metastasis in the spine and pelvic girdle are stable with no new metastasis. No adenopathy. -Enzalutamide started on 02/09/2019 -follow up [name]  Hypokalemia -replete -check mag      Disposition Plan: Patient From: Home D/C Place: Home vs SNF - 2-3  Days Barriers: Not Clinically Stable--not tolerating diet   Family Communication:   Daughter updated at bedside 5/5  Consultants:  General surgery  Code Status:  DNR  DVT Prophylaxis:  SCDs   Procedures: As Listed in Progress Note Above  Antibiotics: None    Subjective: Patient denies fevers, chills, headache, chest pain, dyspnea, nausea, vomiting, diarrhea, dysuria,  He complains of central abd pain but it is improving.   Objective: Vitals:   03/20/20 1842 03/20/20 2000 03/20/20 2039 03/21/20 0448  BP: 133/80  134/74 (!) 157/84  Pulse: 79  85 81  Resp:   20 20  Temp:   99.2 F (37.3 C) 98.9 F (37.2 C)  TempSrc:   Oral Oral  SpO2:  95% 91% 96%  Weight:      Height:        Intake/Output Summary (Last 24 hours) at 03/21/2020 1109 Last data filed at 03/21/2020 0500 Gross per 24 hour  Intake --  Output 400 ml  Net -400 ml  Weight change:  Exam:   General:  Pt is alert, follows commands appropriately, not in acute distress  HEENT: No icterus, No thrush, No neck mass, [place]/AT  Cardiovascular: RRR, S1/S2, no rubs, no gallops  Respiratory:bibasilar crackles.  No wheeze  Abdomen: Soft/+BS, non tender, non distended, no  guarding  Extremities: No edema, No lymphangitis, No petechiae, No rashes, no synovitis   Data Reviewed: I have personally reviewed following labs and imaging studies Basic Metabolic Panel: Recent Labs  Lab 03/17/20 0557 03/18/20 0819 03/19/20 0430 03/20/20 0405 03/21/20 0427  NA 138 139 140 141 141  K 3.6 3.4* 3.5 3.3* 3.6  CL 103 102 104 106 105  CO2 24 26 25 27 28   GLUCOSE 143* 144* 126* 106* 101*  BUN 17 24* 35* 31* 26*  CREATININE 0.49* 0.54* 0.58* 0.57* 0.53*  CALCIUM 8.8* 8.7* 8.3* 8.2* 8.0*  MG  --   --  2.2 2.4 2.2   Liver Function Tests: Recent Labs  Lab 03/17/20 0557 03/18/20 0819 03/19/20 0430 03/20/20 0405 03/21/20 0427  AST 11* 9* 7* 10* 24  ALT 9 8 8 9 20   ALKPHOS 51 48 46 44 45  BILITOT 0.5 0.8 0.9 0.8 1.1  PROT 7.0 6.6 5.9* 5.8* 5.2*  ALBUMIN 3.3* 2.9* 2.5* 2.6* 2.3*   Recent Labs  Lab 03/16/20 1445  LIPASE 17   No results for input(s): AMMONIA in the last 168 hours. Coagulation Profile: No results for input(s): INR, PROTIME in the last 168 hours. CBC: Recent Labs  Lab 03/16/20 1445 03/17/20 0557 03/18/20 0819 03/19/20 0430 03/20/20 0405  WBC 11.4* 14.0* 11.9* 11.1* 8.9  NEUTROABS 10.4*  --   --  9.2* 7.1  HGB 12.7* 12.3* 12.7* 12.3* 11.3*  HCT 42.8 40.7 41.5 41.1 37.2*  MCV 84.4 83.7 81.5 83.4 83.8  PLT 408* 429* 392 408* 362   Cardiac Enzymes: No results for input(s): CKTOTAL, CKMB, CKMBINDEX, TROPONINI in the last 168 hours. BNP: Invalid input(s): POCBNP CBG: No results for input(s): GLUCAP in the last 168 hours. HbA1C: No results for input(s): HGBA1C in the last 72 hours. Urine analysis:    Component Value Date/Time   COLORURINE RED (A) 11/23/2019 0428   APPEARANCEUR TURBID (A) 11/23/2019 0428   LABSPEC  11/23/2019 0428    TEST NOT REPORTED DUE TO COLOR INTERFERENCE OF URINE PIGMENT   PHURINE  11/23/2019 0428    TEST NOT REPORTED DUE TO COLOR INTERFERENCE OF URINE PIGMENT   GLUCOSEU (A) 11/23/2019 0428    TEST NOT  REPORTED DUE TO COLOR INTERFERENCE OF URINE PIGMENT   HGBUR (A) 11/23/2019 0428    TEST NOT REPORTED DUE TO COLOR INTERFERENCE OF URINE PIGMENT   BILIRUBINUR (A) 11/23/2019 0428    TEST NOT REPORTED DUE TO COLOR INTERFERENCE OF URINE PIGMENT   KETONESUR (A) 11/23/2019 0428    TEST NOT REPORTED DUE TO COLOR INTERFERENCE OF URINE PIGMENT   PROTEINUR >300 (A) 11/23/2019 0428   NITRITE (A) 11/23/2019 0428    TEST NOT REPORTED DUE TO COLOR INTERFERENCE OF URINE PIGMENT   LEUKOCYTESUR (A) 11/23/2019 0428    TEST NOT REPORTED DUE TO COLOR INTERFERENCE OF URINE PIGMENT   Sepsis Labs: @LABRCNTIP (procalcitonin:4,lacticidven:4) ) Recent Results (from the past 240 hour(s))  Respiratory Panel by RT PCR (Flu A&B, Covid) - Nasopharyngeal Swab     Status: None   Collection Time: 03/16/20  7:21 PM   Specimen: Nasopharyngeal Swab  Result Value Ref Range Status   SARS Coronavirus 2 by  RT PCR NEGATIVE NEGATIVE Final    Comment: (NOTE) SARS-CoV-2 target nucleic acids are NOT DETECTED. The SARS-CoV-2 RNA is generally detectable in upper respiratoy specimens during the acute phase of infection. The lowest concentration of SARS-CoV-2 viral copies this assay can detect is 131 copies/mL. A negative result does not preclude SARS-Cov-2 infection and should not be used as the sole basis for treatment or other patient management decisions. A negative result may occur with  improper specimen collection/handling, submission of specimen other than nasopharyngeal swab, presence of viral mutation(s) within the areas targeted by this assay, and inadequate number of viral copies (<131 copies/mL). A negative result must be combined with clinical observations, patient history, and epidemiological information. The expected result is Negative. Fact Sheet for Patients:  PinkCheek.be Fact Sheet for Healthcare Providers:  GravelBags.it This test is not yet ap  proved or cleared by the [place] FDA and  has been authorized for detection and/or diagnosis of SARS-CoV-2 by FDA under an Emergency Use Authorization (EUA). This EUA will remain  in effect (meaning this test can be used) for the duration of the COVID-19 declaration under Section 564(b)(1) of the Act, 21 U.S.C. section 360bbb-3(b)(1), unless the authorization is terminated or revoked sooner.    Influenza A by PCR NEGATIVE NEGATIVE Final   Influenza B by PCR NEGATIVE NEGATIVE Final    Comment: (NOTE) The Xpert Xpress SARS-CoV-2/FLU/RSV assay is intended as an aid in  the diagnosis of influenza from Nasopharyngeal swab specimens and  should not be used as a sole basis for treatment. Nasal washings and  aspirates are unacceptable for Xpert Xpress SARS-CoV-2/FLU/RSV  testing. Fact Sheet for Patients: PinkCheek.be Fact Sheet for Healthcare Providers: GravelBags.it This test is not yet approved or cleared by the [place] FDA and  has been authorized for detection and/or diagnosis of SARS-CoV-2 by  FDA under an Emergency Use Authorization (EUA). This EUA will remain  in effect (meaning this test can be used) for the duration of the  Covid-19 declaration under Section 564(b)(1) of the Act, 21  U.S.C. section 360bbb-3(b)(1), unless the authorization is  terminated or revoked. Performed at [facility], [address] [place], [place] [address]   MRSA PCR Screening     Status: Abnormal   Collection Time: 03/20/20  3:32 PM   Specimen: Nasal Mucosa; Nasopharyngeal  Result Value Ref Range Status   MRSA by PCR POSITIVE (A) NEGATIVE Final    Comment:        The GeneXpert MRSA Assay (FDA approved for NASAL specimens only), is one component of a comprehensive MRSA colonization surveillance program. It is not intended to diagnose MRSA infection nor to guide or monitor treatment for MRSA infections. RESULT CALLED TO, READ  BACK BY AND VERIFIED WITH: THOMAS,K ON 03/20/20 AT 2150 BY LOY,C Performed at [facility], [address], [place], [place] [address]      Scheduled Meds: . amLODipine  5 mg Per Tube q morning - 10a  . Chlorhexidine Gluconate Cloth  6 each Topical Q0600  . isosorbide mononitrate  60 mg Oral q morning - 10a  . metoprolol tartrate  7.5 mg Intravenous Q6H  . mupirocin ointment  1 application Nasal BID  . pantoprazole (PROTONIX) IV  40 mg Intravenous Q24H  . polyethylene glycol  17 g Oral Daily  . potassium chloride  40 mEq Oral Daily  . rosuvastatin  20 mg Per Tube QPM   Continuous Infusions: . lactated ringers 65 mL/hr at 03/20/20 1945  Procedures/Studies: DG Chest 1 View  Result Date: 03/16/2020 CLINICAL DATA:  Weakness and mild shortness of breath. ACS. EXAM: CHEST  1 VIEW COMPARISON:  09/10/2016 FINDINGS: Post median sternotomy and CABG. The heart is normal in size. Aortic atherosclerosis and tortuosity, stable from prior exam. No pulmonary edema, focal airspace disease, pleural effusion or pneumothorax. No acute osseous abnormalities are seen. IMPRESSION: 1. No acute abnormality. 2. Post CABG with normal heart size. Aortic Atherosclerosis (ICD10-I70.0). Electronically Signed   By: Keith Rake M.D.   On: 03/16/2020 15:40   CT ABDOMEN PELVIS W CONTRAST  Result Date: 03/16/2020 CLINICAL DATA:  Acute generalized abdominal pain. Nausea. Weakness. Patient reports constipation. Active chemotherapy. History of prostate cancer. EXAM: CT ABDOMEN AND PELVIS WITH CONTRAST TECHNIQUE: Multidetector CT imaging of the abdomen and pelvis was performed using the standard protocol following bolus administration of intravenous contrast. CONTRAST:  169mL OMNIPAQUE IOHEXOL 300 MG/ML  SOLN COMPARISON:  Abdominal CT 02/22/2020 FINDINGS: Lower chest: Subsegmental linear atelectasis in the right lower lobe. Heart is normal in size. There are coronary artery calcifications. Fluid distends the distal esophagus  without wall thickening. Hepatobiliary: Borderline hepatic steatosis without focal lesion. Gallstone without pericholecystic inflammation. No biliary dilatation. Pancreas: No ductal dilatation or inflammation. Spleen: Slightly heterogeneous but no evidence of focal lesion. Normal in size. Adrenals/Urinary Tract: No adrenal nodule. No hydronephrosis. Chronic fullness of the right renal pelvis. There is symmetric renal excretion on delayed phase imaging. Cortical cyst in the right kidney, as well as bilateral low-density lesions too small to accurately characterize. Mild cortical scarring in the lower left kidney unchanged. Suprapubic tube decompresses the urinary bladder. Diffuse bladder wall thickening which is stable from prior. Stomach/Bowel: Fluid distends the distal esophagus. Distended fluid-filled stomach. Proximal small bowel are dilated and fluid-filled. Transition from dilated to nondilated small bowel in the right abdomen, series 5, image 37, series 2, image 49. The more distal small bowel is decompressed. There is no small bowel pneumatosis. Minimal mesenteric edema in the right abdomen with trace free fluid. Appendix not definitively visualized. The ascending colon is decompressed. Moderate volume of stool in the transverse, descending, and sigmoid colon. Sigmoid diverticulosis without focal diverticulitis. Possible invasion of the anterior rectal wall by prostatic mass, series 2, image 89, also seen on prior exam. Vascular/Lymphatic: Aorto bi-iliac atherosclerosis. No aortic aneurysm. The portal vein is patent. Mesenteric vessels are patent. No abdominopelvic adenopathy. Reproductive: Irregular low-density mass within the prostate, similar to prior measuring 5.6 x 4.6 cm, series 2, image 86. This likely invades the anterior wall of the rectum. Other: Trace free fluid in the right abdomen. No other ascites. No free air. No intra-abdominal abscess. Musculoskeletal: Scattered sclerotic lesions in the  pelvis are unchanged. Scattered sclerotic lesions in the spine, also unchanged. No pathologic fracture. IMPRESSION: 1. Small-bowel obstruction with transition point in the right abdomen, likely due to adhesions. 2. Moderate stool in the transverse, descending, and sigmoid colon. Distal diverticulosis without diverticulitis. 3. Irregular low-density mass within the prostate, likely invades the anterior wall of the rectum, grossly stable over the last 3 weeks. 4. Suprapubic tube decompresses the urinary bladder. Diffuse bladder wall thickening is chronic and likely related to enlarged prostate gland. 5. Cholelithiasis without pericholecystic inflammation. 6. Sclerotic lesions in the pelvis and spine are unchanged from prior. Aortic Atherosclerosis (ICD10-I70.0). Electronically Signed   By: Keith Rake M.D.   On: 03/16/2020 17:26   CT Abdomen Pelvis W Contrast  Result Date: 02/22/2020 CLINICAL DATA:  Metastatic prostate cancer status  post radiation therapy with ongoing oral chemotherapy. Lower abdominal pain, nausea and intermittent constipation. EXAM: CT ABDOMEN AND PELVIS WITH CONTRAST TECHNIQUE: Multidetector CT imaging of the abdomen and pelvis was performed using the standard protocol following bolus administration of intravenous contrast. CONTRAST:  146mL OMNIPAQUE IOHEXOL 300 MG/ML  SOLN COMPARISON:  09/05/2019 CT abdomen/pelvis. FINDINGS: Lower chest: No significant pulmonary nodules or acute consolidative airspace disease. Intact lower sternotomy wires. Coronary atherosclerosis. Hepatobiliary: Normal liver size. No liver mass. Normal gallbladder with no radiopaque cholelithiasis. No biliary ductal dilatation. Pancreas: Normal, with no mass or duct dilation. Spleen: Normal size. No mass. Adrenals/Urinary Tract: Normal adrenals. Scattered simple right renal cortical cysts, largest 2.5 cm in the upper right kidney. Several subcentimeter hypodense renal cortical lesions scattered in both kidneys, too small  to characterize requiring no follow-up. Chronic mild fullness of the bilateral extrarenal pelves and upper right renal collecting system, without overt hydronephrosis. Mild bilateral ureterectasis is unchanged. Bladder collapsed by indwelling suprapubic catheter. Chronic diffuse bladder wall thickening is unchanged. Stomach/Bowel: Normal non-distended stomach. Normal caliber small bowel with no small bowel wall thickening. Appendix not discretely visualized. Oral contrast transits to the right colon. Marked sigmoid diverticulosis with no large bowel wall thickening or significant pericolonic stranding. Moderate colonic stool volume. There is apparent direct invasion of the anterior mid to lower rectal wall by the prostatic mass (series 2/image 80), which appears progressed in the interval. Vascular/Lymphatic: Atherosclerotic nonaneurysmal abdominal aorta. Patent portal, splenic, hepatic and renal veins. No pathologically enlarged lymph nodes in the abdomen or pelvis. Reproductive: Irregular heterogeneously enhancing prostatic mass measures up to 5.7 x 4.7 cm (series 2/image 82), previously 5.6 x 4.7 cm, not substantially changed. Other: No pneumoperitoneum, ascites or focal fluid collection. Musculoskeletal: A few scattered sclerotic lesions in spine and pelvic girdle, largest in the medial right iliac at (series 2/image 65), not appreciably changed. No new osseous lesions. Moderate lumbar spondylosis. IMPRESSION: 1. Irregular malignant appearing prostatic mass is overall not substantially changed in size, however appears to increasingly directly invade the anterior mid to lower rectal wall. No free air. No abscess. No bowel obstruction. 2. Stable scattered sclerotic osseous metastases in the spine and pelvic girdle. No new bone lesions. 3. No adenopathy or other new findings of metastatic disease in the abdomen or pelvis. 4. Chronic diffuse bladder wall thickening, nonspecific, probably due to chronic bladder  outlet obstruction by the prostatic mass. Bladder collapsed by indwelling suprapubic catheter. Chronic mild fullness of the bilateral renal collecting systems and ureters without overt hydronephrosis. 5. Moderate colonic stool volume, suggesting constipation. 6. Marked sigmoid diverticulosis. 7. Aortic Atherosclerosis (ICD10-I70.0). Electronically Signed   By: Ilona Sorrel M.D.   On: 02/22/2020 16:35   DG Chest Portable 1 View  Result Date: 03/16/2020 CLINICAL DATA:  Abdominal pain, nausea, generalized weakness and constipation, history metastatic prostate cancer with ongoing oral chemotherapy EXAM: PORTABLE CHEST 1 VIEW COMPARISON:  Portable exam 2000 hours compared to 1509 hours FINDINGS: Nasogastric tube extends into stomach. Normal heart size post CABG. Mediastinal contours and pulmonary vascularity normal. Atherosclerotic calcification aorta. Lungs clear. No pleural effusion or pneumothorax. Bones demineralized. IMPRESSION: No acute abnormalities. Electronically Signed   By: Lavonia Dana M.D.   On: 03/16/2020 20:08   DG Abd 2 Views  Result Date: 03/17/2020 CLINICAL DATA:  Prostate cancer, abdominal pain, small-bowel obstruction EXAM: ABDOMEN - 2 VIEW COMPARISON:  03/16/2020 CT abdomen/pelvis FINDINGS: Enteric tube terminates in the body of the stomach. No appreciable dilated gas-filled small bowel loops. Prominent  colonic stool. No evidence of pneumatosis or pneumoperitoneum. No radiopaque nephrolithiasis. Intact lower sternotomy wires. IMPRESSION: Enteric tube terminates in the body of the stomach. No appreciable dilated gas-filled small bowel loops. Prominent colonic stool. Electronically Signed   By: Ilona Sorrel M.D.   On: 03/17/2020 08:52   DG Abd Portable 1V-Small Bowel Obstruction Protocol-initial, 8 hr delay  Result Date: 03/19/2020 CLINICAL DATA:  Small-bowel obstruction EXAM: PORTABLE ABDOMEN - 1 VIEW COMPARISON:  Mar 19, 2020 FINDINGS: There are persistent dilated loops of small bowel in the  mid abdomen measuring up to approximately 3.7 cm in diameter. There is a large amount of stool in the right hemicolon. There is no pneumatosis. No free air. IMPRESSION: Persistent small bowel obstruction versus ileus. Large amount of stool in the right hemicolon. Electronically Signed   By: Constance Holster M.D.   On: 03/19/2020 20:03   DG Abd Portable 1V-Small Bowel Protocol-Position Verification  Result Date: 03/19/2020 CLINICAL DATA:  Small-bowel obstruction EXAM: PORTABLE ABDOMEN - 1 VIEW COMPARISON:  Portable exam 1053 hours compared to 03/17/2020 FINDINGS: Small amount gas in colon. Few prominent loops of small bowel in the mid abdomen. No bowel wall thickening identified. Subsegmental atelectasis at lung bases. Nasogastric tube projects over stomach. Bones demineralized. IMPRESSION: Few nonspecific dilated loops of small bowel in the mid abdomen. Electronically Signed   By: Lavonia Dana M.D.   On: 03/19/2020 11:27    Orson Eva, DO  [facility]  If 7PM-7AM, please contact night-coverage [email or web address] Password [email or web address] 03/21/2020, 11:09 AM   LOS: 5 days

## 2020-03-21 NOTE — Plan of Care (Signed)
  Problem: Education: Goal: Knowledge of General Education information will improve Description: Including pain rating scale, medication(s)/side effects and non-pharmacologic comfort measures Outcome: Progressing   Problem: Clinical Measurements: Goal: Ability to maintain clinical measurements within normal limits will improve Outcome: Progressing Goal: Will remain free from infection Outcome: Progressing   Problem: Elimination: Goal: Will not experience complications related to bowel motility Outcome: Progressing

## 2020-03-22 ENCOUNTER — Ambulatory Visit: Payer: Medicare HMO

## 2020-03-22 DIAGNOSIS — I214 Non-ST elevation (NSTEMI) myocardial infarction: Secondary | ICD-10-CM | POA: Diagnosis not present

## 2020-03-22 DIAGNOSIS — E785 Hyperlipidemia, unspecified: Secondary | ICD-10-CM | POA: Diagnosis not present

## 2020-03-22 DIAGNOSIS — E876 Hypokalemia: Secondary | ICD-10-CM | POA: Diagnosis not present

## 2020-03-22 DIAGNOSIS — I10 Essential (primary) hypertension: Secondary | ICD-10-CM | POA: Diagnosis not present

## 2020-03-22 DIAGNOSIS — Z9359 Other cystostomy status: Secondary | ICD-10-CM | POA: Diagnosis not present

## 2020-03-22 DIAGNOSIS — C7951 Secondary malignant neoplasm of bone: Secondary | ICD-10-CM | POA: Diagnosis not present

## 2020-03-22 DIAGNOSIS — R531 Weakness: Secondary | ICD-10-CM | POA: Diagnosis not present

## 2020-03-22 DIAGNOSIS — I2510 Atherosclerotic heart disease of native coronary artery without angina pectoris: Secondary | ICD-10-CM | POA: Diagnosis not present

## 2020-03-22 DIAGNOSIS — I252 Old myocardial infarction: Secondary | ICD-10-CM | POA: Diagnosis not present

## 2020-03-22 DIAGNOSIS — K56609 Unspecified intestinal obstruction, unspecified as to partial versus complete obstruction: Secondary | ICD-10-CM | POA: Diagnosis not present

## 2020-03-22 DIAGNOSIS — C778 Secondary and unspecified malignant neoplasm of lymph nodes of multiple regions: Secondary | ICD-10-CM | POA: Diagnosis not present

## 2020-03-22 DIAGNOSIS — Z7401 Bed confinement status: Secondary | ICD-10-CM | POA: Diagnosis not present

## 2020-03-22 DIAGNOSIS — K565 Intestinal adhesions [bands], unspecified as to partial versus complete obstruction: Secondary | ICD-10-CM | POA: Diagnosis not present

## 2020-03-22 DIAGNOSIS — C61 Malignant neoplasm of prostate: Secondary | ICD-10-CM | POA: Diagnosis not present

## 2020-03-22 DIAGNOSIS — I639 Cerebral infarction, unspecified: Secondary | ICD-10-CM | POA: Diagnosis not present

## 2020-03-22 DIAGNOSIS — I480 Paroxysmal atrial fibrillation: Secondary | ICD-10-CM | POA: Diagnosis not present

## 2020-03-22 LAB — COMPREHENSIVE METABOLIC PANEL
ALT: 20 U/L (ref 0–44)
AST: 19 U/L (ref 15–41)
Albumin: 2.2 g/dL — ABNORMAL LOW (ref 3.5–5.0)
Alkaline Phosphatase: 47 U/L (ref 38–126)
Anion gap: 10 (ref 5–15)
BUN: 21 mg/dL (ref 8–23)
CO2: 26 mmol/L (ref 22–32)
Calcium: 7.8 mg/dL — ABNORMAL LOW (ref 8.9–10.3)
Chloride: 102 mmol/L (ref 98–111)
Creatinine, Ser: 0.51 mg/dL — ABNORMAL LOW (ref 0.61–1.24)
GFR calc Af Amer: >60 mL/min (ref >60)
GFR calc non Af Amer: >60 mL/min (ref >60)
Glucose, Bld: 94 mg/dL (ref 70–99)
Potassium: 3.9 mmol/L (ref 3.5–5.1)
Sodium: 138 mmol/L (ref 135–145)
Total Bilirubin: 1 mg/dL (ref 0.3–1.2)
Total Protein: 5 g/dL — ABNORMAL LOW (ref 6.5–8.1)

## 2020-03-22 MED ORDER — BISACODYL 10 MG RE SUPP
10.0000 mg | Freq: Every morning | RECTAL | Status: DC
  Administered 2020-03-22: 10 mg via RECTAL
  Filled 2020-03-22: qty 1

## 2020-03-22 MED ORDER — HYDROCODONE-ACETAMINOPHEN 5-325 MG PO TABS: 1.0000 | ORAL_TABLET | Freq: Every evening | ORAL | 0 refills | Status: DC | PRN

## 2020-03-22 MED ORDER — TRAMADOL HCL 50 MG PO TABS: 100.0000 mg | ORAL_TABLET | Freq: Four times a day (QID) | ORAL | 0 refills | Status: DC | PRN

## 2020-03-22 MED ORDER — BISACODYL 10 MG RE SUPP: 10.0000 mg | Freq: Every morning | RECTAL | 0 refills | Status: AC

## 2020-03-22 NOTE — Plan of Care (Signed)
  Problem: Education: Goal: Knowledge of General Education information will improve Description: Including pain rating scale, medication(s)/side effects and non-pharmacologic comfort measures Outcome: Progressing   Problem: Clinical Measurements: Goal: Ability to maintain clinical measurements within normal limits will improve Outcome: Progressing Goal: Will remain free from infection Outcome: Progressing   

## 2020-03-22 NOTE — Progress Notes (Signed)
Called report to [facility] at [facility] [place]

## 2020-03-22 NOTE — TOC Transition Note (Signed)
Transition of Care [facility]) - CM/SW Discharge Note   Patient Details  Name: Elijah Santana MRN: [id number] Date of Birth: 1932-07-05  [facility]) CM/SW Contact:  Lenni Reckner, Chauncey [place], RN Phone Number: 03/22/2020, 1:47 PM   Clinical Narrative:   Patient medically ready to discharge to [facility]. Insurance authorization received. Discussed with daughter, Elijah Santana, she is agreeable to continue with plan. Bedside RN to call report. EMS to transport.     Final next level of care: Skilled Nursing Facility Barriers to Discharge: Barriers Resolved   Patient Goals and CMS Choice Patient states their goals for this hospitalization and ongoing recovery are:: to go home CMS Medicare.gov Compare Post Acute Care list provided to:: Patient Represenative (must comment) Choice offered to / list presented to : Adult Children  Discharge Placement PASRR number recieved: 03/19/20            Patient chooses bed at: [facility] Patient to be transferred to facility by: [place] Name of family member notified: Elijah Santana Patient and family notified of of transfer: 03/22/20       Social Determinants of Health ([place]) Interventions     Readmission Risk Interventions Readmission Risk Prevention Plan 03/19/2020  Transportation Screening Complete  Medication Review Press photographer) Complete  PCP or Specialist appointment within 3-5 days of discharge Not Complete  HRI or Home Care Consult Complete  SW Recovery Care/Counseling Consult Complete  Palliative Care Screening Not [place] Complete  Some recent data might be hidden

## 2020-03-22 NOTE — Progress Notes (Signed)
Nsg Discharge Note  Admit Date:  03/16/2020 Discharge date: 03/22/2020   Elijah Santana to be D/C'd Home per MD order.  AVS completed.  Copy for chart, and copy for patient signed, and dated. Removed IV-clean, dry, intact. Called report to Constantine at [facility]. [facility] came to transport him. Patient/caregiver able to verbalize understanding.  Discharge Medication: Allergies as of 03/22/2020   No Known Allergies     Medication List    STOP taking these medications   acetaminophen 325 MG tablet Commonly known as: TYLENOL   nitroGLYCERIN 0.4 MG SL tablet Commonly known as: NITROSTAT   Stool Softener 100 MG capsule Generic drug: docusate sodium     TAKE these medications   amLODipine 5 MG tablet Commonly known as: NORVASC TAKE ONE TABLET BY MOUTH DAILY (MORNING) What changed: See the new instructions.   bisacodyl 10 MG suppository Commonly known as: DULCOLAX Place 1 suppository (10 mg total) rectally every morning. Start taking on: Mar 23, 2020   calcium carbonate 1250 (500 Ca) MG tablet Commonly known as: OS-CAL - dosed in mg of elemental calcium Take 1 tablet by mouth daily.   clopidogrel 75 MG tablet Commonly known as: PLAVIX TAKE ONE TABLET BY MOUTH EVERY DAY (,EVENING) What changed: See the new instructions.   Eliquis 5 MG Tabs tablet Generic drug: apixaban TAKE ONE TABLET BY MOUTH TWICE DAILY. (MORNING ,EVENING) What changed: See the new instructions.   HYDROcodone-acetaminophen 5-325 MG tablet Commonly known as: Norco Take 1 tablet by mouth at bedtime as needed for moderate pain.   isosorbide mononitrate 60 MG 24 hr tablet Commonly known as: IMDUR TAKE ONE TABLET BY MOUTH DAILY (MORNING) What changed: See the new instructions.   linaclotide 290 MCG Caps capsule Commonly known as: Linzess Take 1 capsule (290 mcg total) by mouth daily before breakfast.   LUPRON IJ Inject as directed every 4 (four) months.   metoprolol tartrate 50 MG tablet Commonly known  as: LOPRESSOR Take 1 tablet (50 mg total) by mouth 2 (two) times daily.   pantoprazole 40 MG tablet Commonly known as: PROTONIX Take 40 mg by mouth every morning.   polyethylene glycol 17 g packet Commonly known as: MIRALAX / GLYCOLAX Take 17 g by mouth daily.   rosuvastatin 20 MG tablet Commonly known as: CRESTOR Take 1 tablet (20 mg total) by mouth every evening.   Toviaz 4 MG Tb24 tablet Generic drug: fesoterodine Take 4 mg by mouth daily.   traMADol 50 MG tablet Commonly known as: ULTRAM Take 2 tablets (100 mg total) by mouth every 6 (six) hours as needed (for pain).   Xgeva 120 MG/1.7ML Soln injection Generic drug: denosumab Inject 120 mg into the skin once.   Xtandi 40 MG capsule Generic drug: enzalutamide TAKE 4 CAPSULES (160 MG TOTAL) BY MOUTH DAILY. What changed: See the new instructions.       Discharge Assessment: Vitals:   03/22/20 1215 03/22/20 1402  BP: (!) 147/73 (!) 149/84  Pulse: 76 73  Resp: 16 18  Temp: 97.7 F (36.5 C) 98.2 F (36.8 C)  SpO2: 95% 94%   Skin clean, dry and intact without evidence of skin break down, no evidence of skin tears noted. IV catheter discontinued intact. Site without signs and symptoms of complications - no redness or edema noted at insertion site, patient denies c/o pain - only slight tenderness at site.  Dressing with slight pressure applied.  D/c Instructions-Education: Discharge instructions given to patient/family with verbalized understanding. D/c  education completed with patient/family including follow up instructions, medication list, d/c activities limitations if indicated, with other d/c instructions as indicated by MD - patient able to verbalize understanding, all questions fully answered. Patient instructed to return to ED, call 911, or call MD for any changes in condition.  Patient escorted via [place], and D/C home via private auto.  Santa Lighter, RN 03/22/2020 5:32 PM

## 2020-03-22 NOTE — Care Management Important Message (Signed)
Important Message  Patient Details  Name: Elijah Santana MRN: [id number] Date of Birth: [birth year]   Medicare Important Message Given:  Yes     [name] 03/22/2020, 3:27 PM

## 2020-03-22 NOTE — Progress Notes (Signed)
  Subjective: Patient somewhat alert.  Has intermittent abdominal cramping.  Tolerating diet well, felt in small amounts.  Objective: Vital signs in last 24 hours: Temp:  [97.8 F (36.6 C)-98.9 F (37.2 C)] 98.9 F (37.2 C) (05/07 0521) Pulse Rate:  [70-88] 88 (05/07 0521) Resp:  [16] 16 (05/07 0521) BP: (150-168)/(75-86) 159/84 (05/07 0521) SpO2:  [92 %-96 %] 93 % (05/07 0521) Last BM Date: 03/21/20  Intake/Output from previous day: 05/06 0701 - 05/07 0700 In: 4721.7 [P.O.:360; I.V.:4361.7] Out: 1000 [Urine:1000] Intake/Output this shift: Total I/O In: -  Out: 600 [Urine:600]  General appearance: cooperative and no distress GI: soft, non-tender; bowel sounds normal; no masses,  no organomegaly  Lab Results:  Recent Labs    03/20/20 0405  WBC 8.9  HGB 11.3*  HCT 37.2*  PLT 362   BMET Recent Labs    03/21/20 0427 03/22/20 0442  NA 141 138  K 3.6 3.9  CL 105 102  CO2 28 26  GLUCOSE 101* 94  BUN 26* 21  CREATININE 0.53* 0.51*  CALCIUM 8.0* 7.8*   PT/INR No results for input(s): LABPROT, INR in the last 72 hours.  Studies/Results: No results found.  Anti-infectives: Anti-infectives (From admission, onward)   None      Assessment/Plan: Impression: Partial bowel obstruction/ileus resolved.  No need for surgical intervention.  Patient stable from surgical standpoint for placement.  May restart oral anticoagulation as needed.  Please call us if we can be of further assistance.  Discussed with Dr. Carles Collet.  LOS: 6 days    Aviva Signs 03/22/2020

## 2020-03-22 NOTE — Discharge Summary (Signed)
Physician Discharge Summary  REMMY Santana [id number] DOB: [birth year] DOA: 03/16/2020  PCP: Elijah Chroman, MD  Admit date: 03/16/2020 Discharge date: 03/22/2020  Admitted From: Home Disposition:   SNF  Recommendations for Outpatient Follow-up:  1. Follow up with PCP in 1-2 weeks 2. Please obtain BMP/CBC in one week 3. Please transport patient to [facility], [place] Office to see Dr. Jeffie Santana on 04/05/20 at [place] 4. 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 he just finished 4 rounds of radiation over the course of the past 4 days. Evidently the most recent CT demonstrated that the mass is unchanged, however shifted closer to the rectal wall which they suspect is causing his symptoms ofabdominal pain.When he received radiation in Fall of 2020, he respondedvery well, however this time he is becoming fatigued and weak. Patient takes tramadol for his symptoms of pain, which she suspects may be a contributing factor. His oncologist, Elijah Santana, reducedhis tramadol from 100 mg every 6 hours to 75 mg every 6 hours due to his reported weakness. He has been complaining of nausea symptoms to the caregivers as well and they note that he has been dry heaving. CT of the abdomen and pelvis on 03/16/2020 showing small bowel obstruction with a transition point in the right abdomen likely secondary to adhesions. There is moderate stool in the transverse colon and sigmoid colon.  There was chronic diffuse bladder wall thickening. General surgery was consulted to assist with management  Discharge Diagnoses:  Partial small bowel obstruction -Appreciate general surgery -03/20/20--NG tube removed,diet advanced -Case discussed with general surgery, Elijah Santana -03/21/20--patient had 4 BMs in last 24 hours -Continue IV fluids -03/21/20--diet advanced to soft diet -miralax and bisacodyl daily--hold only if having watery diarrhea 3+ BMs -goal is for soft stool daily due to prostate cancer pushing onto rectum  Essential hypertension -Continue IV Lopressor until the patient is able to tolerate p.o. reliably>>>po metoprolol tartrate -Continue amlodipine  Coronary artery disease -No chest pain presently -Continue imdur -continue metoprolol -continue plavix  Hyperlipidemia -Continue statin  Diabetes mellitus type 2 -Holding metformin during hospitalization -CBGs controlled  Metastatic prostate cancer -Patient finished 4 days of radiation prior to admission CTAP from 02/22/2020 which shows prostatic mass stable at 5.7 x 4.7 cm. However appears to increasingly directly invade the anterior mid to lower rectal wall. Bone metastasis in the spine and pelvic girdle are stable with no new metastasis. No adenopathy. -Enzalutamide started on 02/09/2019 -follow up Elijah Santana with him 5/6-->restart Gillermina Phy -follow up with Dr. Irine Santana, in [place] office 04/05/20 at 2PM to determine continuation of Lupron injections -pt missed 3 appointments in Jan 2021 -urology requested PSA to be drawn  Hypokalemia -replete -check mag--2.2   Discharge Instructions   Allergies as of 03/22/2020   No Known Allergies     Medication List    STOP taking these medications   acetaminophen 325 MG tablet Commonly known as: TYLENOL   nitroGLYCERIN 0.4 MG SL tablet Commonly known as: NITROSTAT   Stool Softener 100 MG capsule Generic drug: docusate  sodium      TAKE these medications   amLODipine 5 MG tablet Commonly known as: NORVASC TAKE ONE TABLET BY MOUTH DAILY (MORNING) What changed: See the new instructions.   bisacodyl 10 MG suppository Commonly known as: DULCOLAX Place 1 suppository (10 mg total) rectally every morning. Start taking on: Mar 23, 2020   calcium carbonate 1250 (500 Ca) MG tablet Commonly known as: OS-CAL - dosed in mg of elemental calcium Take 1 tablet by mouth daily.   clopidogrel 75 MG tablet Commonly known as: PLAVIX TAKE ONE TABLET BY MOUTH EVERY DAY (,EVENING) What changed: See the new instructions.   Eliquis 5 MG Tabs tablet Generic drug: apixaban TAKE ONE TABLET BY MOUTH TWICE DAILY. (MORNING ,EVENING) What changed: See the new instructions.   HYDROcodone-acetaminophen 5-325 MG tablet Commonly known as: Norco Take 1 tablet by mouth at bedtime as needed for moderate pain.   isosorbide mononitrate 60 MG 24 hr tablet Commonly known as: IMDUR TAKE ONE TABLET BY MOUTH DAILY (MORNING) What changed: See the new instructions.   linaclotide 290 MCG Caps capsule Commonly known as: Linzess Take 1 capsule (290 mcg total) by mouth daily before breakfast.   LUPRON IJ Inject as directed every 4 (four) months.   metoprolol tartrate 50 MG tablet Commonly known as: LOPRESSOR Take 1 tablet (50 mg total) by mouth 2 (two) times daily.   pantoprazole 40 MG tablet Commonly known as: PROTONIX Take 40 mg by mouth every morning.   polyethylene glycol 17 g packet Commonly known as: MIRALAX / GLYCOLAX Take 17 g by mouth daily.   rosuvastatin 20 MG tablet Commonly known as: CRESTOR Take 1 tablet (20 mg total) by mouth every evening.   Toviaz 4 MG Tb24 tablet Generic drug: fesoterodine Take 4 mg by mouth daily.   traMADol 50 MG tablet Commonly known as: ULTRAM Take 2 tablets (100 mg total) by mouth every 6 (six) hours as needed (for pain).   Xgeva 120 MG/1.7ML Soln injection Generic drug: denosumab Inject  120 mg into the skin once.   Xtandi 40 MG capsule Generic drug: enzalutamide TAKE 4 CAPSULES (160 MG TOTAL) BY MOUTH DAILY. What changed: See the new instructions.       Contact information for follow-up providers    Elijah Seal, MD Follow up.   Specialty: Urology Why: 04/05/20 at [place] information: [address] [place] [address] [phone]            Contact information for after-discharge care    [facility] .   Service: Skilled Nursing Contact information: [address] [place] [phone]                 No Known Allergies  Consultations:  Palliative medicine   Procedures/Studies: DG Chest 1 View  Result Date: 03/16/2020 CLINICAL DATA:  Weakness and mild shortness of breath. ACS. EXAM: CHEST  1 VIEW COMPARISON:  09/10/2016 FINDINGS: Post median sternotomy and CABG. The heart is normal in size. Aortic atherosclerosis and tortuosity, stable from prior exam. No pulmonary edema, focal airspace disease, pleural effusion or pneumothorax. No acute osseous abnormalities are seen. IMPRESSION: 1. No acute abnormality. 2. Post CABG with normal heart size. Aortic Atherosclerosis (ICD10-I70.0). Electronically Signed   By: Keith Rake M.D.   On: 03/16/2020 15:40   CT ABDOMEN PELVIS W CONTRAST  Result Date: 03/16/2020 CLINICAL DATA:  Acute generalized abdominal pain. Nausea. Weakness. Patient reports  constipation. Active chemotherapy. History of prostate cancer. EXAM: CT ABDOMEN AND PELVIS WITH CONTRAST TECHNIQUE: Multidetector CT imaging of the abdomen and pelvis was performed using the standard protocol following bolus administration of intravenous contrast. CONTRAST:  167mL OMNIPAQUE IOHEXOL 300 MG/ML  SOLN COMPARISON:  Abdominal CT 02/22/2020 FINDINGS: Lower chest: Subsegmental linear atelectasis in the right lower lobe. Heart is normal in size. There are coronary artery  calcifications. Fluid distends the distal esophagus without wall thickening. Hepatobiliary: Borderline hepatic steatosis without focal lesion. Gallstone without pericholecystic inflammation. No biliary dilatation. Pancreas: No ductal dilatation or inflammation. Spleen: Slightly heterogeneous but no evidence of focal lesion. Normal in size. Adrenals/Urinary Tract: No adrenal nodule. No hydronephrosis. Chronic fullness of the right renal pelvis. There is symmetric renal excretion on delayed phase imaging. Cortical cyst in the right kidney, as well as bilateral low-density lesions too small to accurately characterize. Mild cortical scarring in the lower left kidney unchanged. Suprapubic tube decompresses the urinary bladder. Diffuse bladder wall thickening which is stable from prior. Stomach/Bowel: Fluid distends the distal esophagus. Distended fluid-filled stomach. Proximal small bowel are dilated and fluid-filled. Transition from dilated to nondilated small bowel in the right abdomen, series 5, image 37, series 2, image 49. The more distal small bowel is decompressed. There is no small bowel pneumatosis. Minimal mesenteric edema in the right abdomen with trace free fluid. Appendix not definitively visualized. The ascending colon is decompressed. Moderate volume of stool in the transverse, descending, and sigmoid colon. Sigmoid diverticulosis without focal diverticulitis. Possible invasion of the anterior rectal wall by prostatic mass, series 2, image 89, also seen on prior exam. Vascular/Lymphatic: Aorto bi-iliac atherosclerosis. No aortic aneurysm. The portal vein is patent. Mesenteric vessels are patent. No abdominopelvic adenopathy. Reproductive: Irregular low-density mass within the prostate, similar to prior measuring 5.6 x 4.6 cm, series 2, image 86. This likely invades the anterior wall of the rectum. Other: Trace free fluid in the right abdomen. No other ascites. No free air. No intra-abdominal abscess.  Musculoskeletal: Scattered sclerotic lesions in the pelvis are unchanged. Scattered sclerotic lesions in the spine, also unchanged. No pathologic fracture. IMPRESSION: 1. Small-bowel obstruction with transition point in the right abdomen, likely due to adhesions. 2. Moderate stool in the transverse, descending, and sigmoid colon. Distal diverticulosis without diverticulitis. 3. Irregular low-density mass within the prostate, likely invades the anterior wall of the rectum, grossly stable over the last 3 weeks. 4. Suprapubic tube decompresses the urinary bladder. Diffuse bladder wall thickening is chronic and likely related to enlarged prostate gland. 5. Cholelithiasis without pericholecystic inflammation. 6. Sclerotic lesions in the pelvis and spine are unchanged from prior. Aortic Atherosclerosis (ICD10-I70.0). Electronically Signed   By: Keith Rake M.D.   On: 03/16/2020 17:26   CT Abdomen Pelvis W Contrast  Result Date: 02/22/2020 CLINICAL DATA:  Metastatic prostate cancer status post radiation therapy with ongoing oral chemotherapy. Lower abdominal pain, nausea and intermittent constipation. EXAM: CT ABDOMEN AND PELVIS WITH CONTRAST TECHNIQUE: Multidetector CT imaging of the abdomen and pelvis was performed using the standard protocol following bolus administration of intravenous contrast. CONTRAST:  162mL OMNIPAQUE IOHEXOL 300 MG/ML  SOLN COMPARISON:  09/05/2019 CT abdomen/pelvis. FINDINGS: Lower chest: No significant pulmonary nodules or acute consolidative airspace disease. Intact lower sternotomy wires. Coronary atherosclerosis. Hepatobiliary: Normal liver size. No liver mass. Normal gallbladder with no radiopaque cholelithiasis. No biliary ductal dilatation. Pancreas: Normal, with no mass or duct dilation. Spleen: Normal size. No mass. Adrenals/Urinary Tract: Normal adrenals. Scattered simple right renal cortical cysts,  largest 2.5 cm in the upper right kidney. Several subcentimeter hypodense renal  cortical lesions scattered in both kidneys, too small to characterize requiring no follow-up. Chronic mild fullness of the bilateral extrarenal pelves and upper right renal collecting system, without overt hydronephrosis. Mild bilateral ureterectasis is unchanged. Bladder collapsed by indwelling suprapubic catheter. Chronic diffuse bladder wall thickening is unchanged. Stomach/Bowel: Normal non-distended stomach. Normal caliber small bowel with no small bowel wall thickening. Appendix not discretely visualized. Oral contrast transits to the right colon. Marked sigmoid diverticulosis with no large bowel wall thickening or significant pericolonic stranding. Moderate colonic stool volume. There is apparent direct invasion of the anterior mid to lower rectal wall by the prostatic mass (series 2/image 80), which appears progressed in the interval. Vascular/Lymphatic: Atherosclerotic nonaneurysmal abdominal aorta. Patent portal, splenic, hepatic and renal veins. No pathologically enlarged lymph nodes in the abdomen or pelvis. Reproductive: Irregular heterogeneously enhancing prostatic mass measures up to 5.7 x 4.7 cm (series 2/image 82), previously 5.6 x 4.7 cm, not substantially changed. Other: No pneumoperitoneum, ascites or focal fluid collection. Musculoskeletal: A few scattered sclerotic lesions in spine and pelvic girdle, largest in the medial right iliac at (series 2/image 65), not appreciably changed. No new osseous lesions. Moderate lumbar spondylosis. IMPRESSION: 1. Irregular malignant appearing prostatic mass is overall not substantially changed in size, however appears to increasingly directly invade the anterior mid to lower rectal wall. No free air. No abscess. No bowel obstruction. 2. Stable scattered sclerotic osseous metastases in the spine and pelvic girdle. No new bone lesions. 3. No adenopathy or other new findings of metastatic disease in the abdomen or pelvis. 4. Chronic diffuse bladder wall  thickening, nonspecific, probably due to chronic bladder outlet obstruction by the prostatic mass. Bladder collapsed by indwelling suprapubic catheter. Chronic mild fullness of the bilateral renal collecting systems and ureters without overt hydronephrosis. 5. Moderate colonic stool volume, suggesting constipation. 6. Marked sigmoid diverticulosis. 7. Aortic Atherosclerosis (ICD10-I70.0). Electronically Signed   By: Ilona Sorrel M.D.   On: 02/22/2020 16:35   DG Chest Portable 1 View  Result Date: 03/16/2020 CLINICAL DATA:  Abdominal pain, nausea, generalized weakness and constipation, history metastatic prostate cancer with ongoing oral chemotherapy EXAM: PORTABLE CHEST 1 VIEW COMPARISON:  Portable exam 2000 hours compared to 1509 hours FINDINGS: Nasogastric tube extends into stomach. Normal heart size post CABG. Mediastinal contours and pulmonary vascularity normal. Atherosclerotic calcification aorta. Lungs clear. No pleural effusion or pneumothorax. Bones demineralized. IMPRESSION: No acute abnormalities. Electronically Signed   By: Lavonia Dana M.D.   On: 03/16/2020 20:08   DG Abd 2 Views  Result Date: 03/17/2020 CLINICAL DATA:  Prostate cancer, abdominal pain, small-bowel obstruction EXAM: ABDOMEN - 2 VIEW COMPARISON:  03/16/2020 CT abdomen/pelvis FINDINGS: Enteric tube terminates in the body of the stomach. No appreciable dilated gas-filled small bowel loops. Prominent colonic stool. No evidence of pneumatosis or pneumoperitoneum. No radiopaque nephrolithiasis. Intact lower sternotomy wires. IMPRESSION: Enteric tube terminates in the body of the stomach. No appreciable dilated gas-filled small bowel loops. Prominent colonic stool. Electronically Signed   By: Ilona Sorrel M.D.   On: 03/17/2020 08:52   DG Abd Portable 1V-Small Bowel Obstruction Protocol-initial, 8 hr delay  Result Date: 03/19/2020 CLINICAL DATA:  Small-bowel obstruction EXAM: PORTABLE ABDOMEN - 1 VIEW COMPARISON:  Mar 19, 2020 FINDINGS:  There are persistent dilated loops of small bowel in the mid abdomen measuring up to approximately 3.7 cm in diameter. There is a large amount of stool in the right hemicolon.  There is no pneumatosis. No free air. IMPRESSION: Persistent small bowel obstruction versus ileus. Large amount of stool in the right hemicolon. Electronically Signed   By: Constance Holster M.D.   On: 03/19/2020 20:03   DG Abd Portable 1V-Small Bowel Protocol-Position Verification  Result Date: 03/19/2020 CLINICAL DATA:  Small-bowel obstruction EXAM: PORTABLE ABDOMEN - 1 VIEW COMPARISON:  Portable exam 1053 hours compared to 03/17/2020 FINDINGS: Small amount gas in colon. Few prominent loops of small bowel in the mid abdomen. No bowel wall thickening identified. Subsegmental atelectasis at lung bases. Nasogastric tube projects over stomach. Bones demineralized. IMPRESSION: Few nonspecific dilated loops of small bowel in the mid abdomen. Electronically Signed   By: Lavonia Dana M.D.   On: 03/19/2020 11:27        Discharge Exam: Vitals:   03/22/20 0521 03/22/20 1215  BP: (!) 159/84 (!) 147/73  Pulse: 88 76  Resp: 16 16  Temp: 98.9 F (37.2 C) 97.7 F (36.5 C)  SpO2: 93% 95%   Vitals:   03/21/20 1943 03/21/20 2001 03/22/20 0521 03/22/20 1215  BP: (!) 165/75  (!) 159/84 (!) 147/73  Pulse: 70  88 76  Resp: 16  16 16   Temp: 97.8 F (36.6 C)  98.9 F (37.2 C) 97.7 F (36.5 C)  TempSrc: Oral  Oral Oral  SpO2: 96% 92% 93% 95%  Weight:      Height:        General: Pt is alert, awake, not in acute distress Cardiovascular: RRR, S1/S2 +, no rubs, no gallops Respiratory: bibasilar rales Abdominal: Soft, mild tender, ND, bowel sounds + Extremities: no edema, no cyanosis   The results of significant diagnostics from this hospitalization (including imaging, microbiology, ancillary and laboratory) are listed below for reference.    Significant Diagnostic Studies: DG Chest 1 View  Result Date: 03/16/2020 CLINICAL  DATA:  Weakness and mild shortness of breath. ACS. EXAM: CHEST  1 VIEW COMPARISON:  09/10/2016 FINDINGS: Post median sternotomy and CABG. The heart is normal in size. Aortic atherosclerosis and tortuosity, stable from prior exam. No pulmonary edema, focal airspace disease, pleural effusion or pneumothorax. No acute osseous abnormalities are seen. IMPRESSION: 1. No acute abnormality. 2. Post CABG with normal heart size. Aortic Atherosclerosis (ICD10-I70.0). Electronically Signed   By: Keith Rake M.D.   On: 03/16/2020 15:40   CT ABDOMEN PELVIS W CONTRAST  Result Date: 03/16/2020 CLINICAL DATA:  Acute generalized abdominal pain. Nausea. Weakness. Patient reports constipation. Active chemotherapy. History of prostate cancer. EXAM: CT ABDOMEN AND PELVIS WITH CONTRAST TECHNIQUE: Multidetector CT imaging of the abdomen and pelvis was performed using the standard protocol following bolus administration of intravenous contrast. CONTRAST:  111mL OMNIPAQUE IOHEXOL 300 MG/ML  SOLN COMPARISON:  Abdominal CT 02/22/2020 FINDINGS: Lower chest: Subsegmental linear atelectasis in the right lower lobe. Heart is normal in size. There are coronary artery calcifications. Fluid distends the distal esophagus without wall thickening. Hepatobiliary: Borderline hepatic steatosis without focal lesion. Gallstone without pericholecystic inflammation. No biliary dilatation. Pancreas: No ductal dilatation or inflammation. Spleen: Slightly heterogeneous but no evidence of focal lesion. Normal in size. Adrenals/Urinary Tract: No adrenal nodule. No hydronephrosis. Chronic fullness of the right renal pelvis. There is symmetric renal excretion on delayed phase imaging. Cortical cyst in the right kidney, as well as bilateral low-density lesions too small to accurately characterize. Mild cortical scarring in the lower left kidney unchanged. Suprapubic tube decompresses the urinary bladder. Diffuse bladder wall thickening which is stable from  prior. Stomach/Bowel: Fluid distends  the distal esophagus. Distended fluid-filled stomach. Proximal small bowel are dilated and fluid-filled. Transition from dilated to nondilated small bowel in the right abdomen, series 5, image 37, series 2, image 49. The more distal small bowel is decompressed. There is no small bowel pneumatosis. Minimal mesenteric edema in the right abdomen with trace free fluid. Appendix not definitively visualized. The ascending colon is decompressed. Moderate volume of stool in the transverse, descending, and sigmoid colon. Sigmoid diverticulosis without focal diverticulitis. Possible invasion of the anterior rectal wall by prostatic mass, series 2, image 89, also seen on prior exam. Vascular/Lymphatic: Aorto bi-iliac atherosclerosis. No aortic aneurysm. The portal vein is patent. Mesenteric vessels are patent. No abdominopelvic adenopathy. Reproductive: Irregular low-density mass within the prostate, similar to prior measuring 5.6 x 4.6 cm, series 2, image 86. This likely invades the anterior wall of the rectum. Other: Trace free fluid in the right abdomen. No other ascites. No free air. No intra-abdominal abscess. Musculoskeletal: Scattered sclerotic lesions in the pelvis are unchanged. Scattered sclerotic lesions in the spine, also unchanged. No pathologic fracture. IMPRESSION: 1. Small-bowel obstruction with transition point in the right abdomen, likely due to adhesions. 2. Moderate stool in the transverse, descending, and sigmoid colon. Distal diverticulosis without diverticulitis. 3. Irregular low-density mass within the prostate, likely invades the anterior wall of the rectum, grossly stable over the last 3 weeks. 4. Suprapubic tube decompresses the urinary bladder. Diffuse bladder wall thickening is chronic and likely related to enlarged prostate gland. 5. Cholelithiasis without pericholecystic inflammation. 6. Sclerotic lesions in the pelvis and spine are unchanged from prior.  Aortic Atherosclerosis (ICD10-I70.0). Electronically Signed   By: Keith Rake M.D.   On: 03/16/2020 17:26   CT Abdomen Pelvis W Contrast  Result Date: 02/22/2020 CLINICAL DATA:  Metastatic prostate cancer status post radiation therapy with ongoing oral chemotherapy. Lower abdominal pain, nausea and intermittent constipation. EXAM: CT ABDOMEN AND PELVIS WITH CONTRAST TECHNIQUE: Multidetector CT imaging of the abdomen and pelvis was performed using the standard protocol following bolus administration of intravenous contrast. CONTRAST:  155mL OMNIPAQUE IOHEXOL 300 MG/ML  SOLN COMPARISON:  09/05/2019 CT abdomen/pelvis. FINDINGS: Lower chest: No significant pulmonary nodules or acute consolidative airspace disease. Intact lower sternotomy wires. Coronary atherosclerosis. Hepatobiliary: Normal liver size. No liver mass. Normal gallbladder with no radiopaque cholelithiasis. No biliary ductal dilatation. Pancreas: Normal, with no mass or duct dilation. Spleen: Normal size. No mass. Adrenals/Urinary Tract: Normal adrenals. Scattered simple right renal cortical cysts, largest 2.5 cm in the upper right kidney. Several subcentimeter hypodense renal cortical lesions scattered in both kidneys, too small to characterize requiring no follow-up. Chronic mild fullness of the bilateral extrarenal pelves and upper right renal collecting system, without overt hydronephrosis. Mild bilateral ureterectasis is unchanged. Bladder collapsed by indwelling suprapubic catheter. Chronic diffuse bladder wall thickening is unchanged. Stomach/Bowel: Normal non-distended stomach. Normal caliber small bowel with no small bowel wall thickening. Appendix not discretely visualized. Oral contrast transits to the right colon. Marked sigmoid diverticulosis with no large bowel wall thickening or significant pericolonic stranding. Moderate colonic stool volume. There is apparent direct invasion of the anterior mid to lower rectal wall by the prostatic  mass (series 2/image 80), which appears progressed in the interval. Vascular/Lymphatic: Atherosclerotic nonaneurysmal abdominal aorta. Patent portal, splenic, hepatic and renal veins. No pathologically enlarged lymph nodes in the abdomen or pelvis. Reproductive: Irregular heterogeneously enhancing prostatic mass measures up to 5.7 x 4.7 cm (series 2/image 82), previously 5.6 x 4.7 cm, not substantially changed. Other: No pneumoperitoneum,  ascites or focal fluid collection. Musculoskeletal: A few scattered sclerotic lesions in spine and pelvic girdle, largest in the medial right iliac at (series 2/image 65), not appreciably changed. No new osseous lesions. Moderate lumbar spondylosis. IMPRESSION: 1. Irregular malignant appearing prostatic mass is overall not substantially changed in size, however appears to increasingly directly invade the anterior mid to lower rectal wall. No free air. No abscess. No bowel obstruction. 2. Stable scattered sclerotic osseous metastases in the spine and pelvic girdle. No new bone lesions. 3. No adenopathy or other new findings of metastatic disease in the abdomen or pelvis. 4. 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 Bladder collapsed by indwelling suprapubic catheter. Chronic mild fullness of the bilateral renal collecting systems and ureters without overt hydronephrosis. 5. Moderate colonic stool volume, suggesting constipation. 6. Marked sigmoid diverticulosis. 7. Aortic Atherosclerosis (ICD10-I70.0). Electronically Signed   By: Ilona Sorrel M.D.   On: 02/22/2020 16:35   DG Chest Portable 1 View  Result Date: 03/16/2020 CLINICAL DATA:  Abdominal pain, nausea, generalized weakness and constipation, history metastatic prostate cancer with ongoing oral chemotherapy EXAM: PORTABLE CHEST 1 VIEW COMPARISON:  Portable exam 2000 hours compared to 1509 hours FINDINGS: Nasogastric tube extends into  stomach. Normal heart size post CABG. Mediastinal contours and pulmonary vascularity normal. Atherosclerotic calcification aorta. Lungs clear. No pleural effusion or pneumothorax. Bones demineralized. IMPRESSION: No acute abnormalities. Electronically Signed   By: Lavonia Dana M.D.   On: 03/16/2020 20:08   DG Abd 2 Views  Result Date: 03/17/2020 CLINICAL DATA:  Prostate cancer, abdominal pain, small-bowel obstruction EXAM: ABDOMEN - 2 VIEW COMPARISON:  03/16/2020 CT abdomen/pelvis FINDINGS: Enteric tube terminates in the body of the stomach. No appreciable dilated gas-filled small bowel loops. Prominent colonic stool. No evidence of pneumatosis or pneumoperitoneum. No radiopaque nephrolithiasis. Intact lower sternotomy wires. IMPRESSION: Enteric tube terminates in the body of the stomach. No appreciable dilated gas-filled small bowel loops. Prominent colonic stool. Electronically Signed   By: Ilona Sorrel M.D.   On: 03/17/2020 08:52   DG Abd Portable 1V-Small Bowel Obstruction Protocol-initial, 8 hr delay  Result Date: 03/19/2020 CLINICAL DATA:  Small-bowel obstruction EXAM: PORTABLE ABDOMEN - 1 VIEW COMPARISON:  Mar 19, 2020 FINDINGS: There are persistent dilated loops of small bowel in the mid abdomen measuring up to approximately 3.7 cm in diameter. There is a large amount of stool in the right hemicolon. There is no pneumatosis. No free air. IMPRESSION: Persistent small bowel obstruction versus ileus. Large amount of stool in the right hemicolon. Electronically Signed   By: Constance Holster M.D.   On: 03/19/2020 20:03   DG Abd Portable 1V-Small Bowel Protocol-Position Verification  Result Date: 03/19/2020 CLINICAL DATA:  Small-bowel obstruction EXAM: PORTABLE ABDOMEN - 1 VIEW COMPARISON:  Portable exam 1053 hours compared to 03/17/2020 FINDINGS: Small amount gas in colon. Few prominent loops of small bowel in the mid abdomen. No bowel wall thickening identified. Subsegmental atelectasis at lung bases.  Nasogastric tube projects over stomach. Bones demineralized. IMPRESSION: Few nonspecific dilated loops of small bowel in the mid abdomen. Electronically Signed   By: Lavonia Dana M.D.   On: 03/19/2020 11:27     Microbiology: Recent Results (from the past 240 hour(s))  Respiratory Panel by RT PCR (Flu A&B, Covid) - Nasopharyngeal Swab     Status: None   Collection Time: 03/16/20  7:21 PM   Specimen: Nasopharyngeal Swab  Result Value Ref Range Status  SARS Coronavirus 2 by RT PCR NEGATIVE NEGATIVE Final    Comment: (NOTE) SARS-CoV-2 target nucleic acids are NOT DETECTED. The SARS-CoV-2 RNA is generally detectable in upper respiratoy specimens during the acute phase of infection. The lowest concentration of SARS-CoV-2 viral copies this assay can detect is 131 copies/mL. A negative result does not preclude SARS-Cov-2 infection and should not be used as the sole basis for treatment or other patient management decisions. A negative result may occur with  improper specimen collection/handling, submission of specimen other than nasopharyngeal swab, presence of viral mutation(s) within the areas targeted by this assay, and inadequate number of viral copies (<131 copies/mL). A negative result must be combined with clinical observations, patient history, and epidemiological information. The expected result is Negative. Fact Sheet for Patients:  PinkCheek.be Fact Sheet for Healthcare Providers:  GravelBags.it This test is not yet ap proved or cleared by the [place] FDA and  has been authorized for detection and/or diagnosis of SARS-CoV-2 by FDA under an Emergency Use Authorization (EUA). This EUA will remain  in effect (meaning this test can be used) for the duration of the COVID-19 declaration under Section 564(b)(1) of the Act, 21 U.S.C. section 360bbb-3(b)(1), unless the authorization is terminated or revoked sooner.     Influenza A by PCR NEGATIVE NEGATIVE Final   Influenza B by PCR NEGATIVE NEGATIVE Final    Comment: (NOTE) The Xpert Xpress SARS-CoV-2/FLU/RSV assay is intended as an aid in  the diagnosis of influenza from Nasopharyngeal swab specimens and  should not be used as a sole basis for treatment. Nasal washings and  aspirates are unacceptable for Xpert Xpress SARS-CoV-2/FLU/RSV  testing. Fact Sheet for Patients: PinkCheek.be Fact Sheet for Healthcare Providers: GravelBags.it This test is not yet approved or cleared by the [place] FDA and  has been authorized for detection and/or diagnosis of SARS-CoV-2 by  FDA under an Emergency Use Authorization (EUA). This EUA will remain  in effect (meaning this test can be used) for the duration of the  Covid-19 declaration under Section 564(b)(1) of the Act, 21  U.S.C. section 360bbb-3(b)(1), unless the authorization is  terminated or revoked. Performed at [facility], [address], [place], [place] [address]   MRSA PCR Screening     Status: Abnormal   Collection Time: 03/20/20  3:32 PM   Specimen: Nasal Mucosa; Nasopharyngeal  Result Value Ref Range Status   MRSA by PCR POSITIVE (A) NEGATIVE Final    Comment:        The GeneXpert MRSA Assay (FDA approved for NASAL specimens only), is one component of a comprehensive MRSA colonization surveillance program. It is not intended to diagnose MRSA infection nor to guide or monitor treatment for MRSA infections. RESULT CALLED TO, READ BACK BY AND VERIFIED WITH: THOMAS,K ON 03/20/20 AT 2150 BY LOY,C Performed at [facility] [address], [place], [place] [address]      Labs: Basic Metabolic Panel: Recent Labs  Lab 03/18/20 [phone] 03/18/20 0819 03/19/20 0430 03/19/20 0430 03/20/20 0405 03/20/20 0405 03/21/20 0427 03/22/20 0442  NA 139  --  140  --  141  --  141 138  K 3.4*   < > 3.5   < > 3.3*   < > 3.6 3.9  CL 102  --  104   --  106  --  105 102  CO2 26  --  25  --  27  --  28 26  GLUCOSE 144*  --  126*  --  106*  --  101* 94  BUN 24*  --  35*  --  31*  --  26* 21  CREATININE 0.54*  --  0.58*  --  0.57*  --  0.53* 0.51*  CALCIUM 8.7*  --  8.3*  --  8.2*  --  8.0* 7.8*  MG  --   --  2.2  --  2.4  --  2.2  --    < > = values in this interval not displayed.   Liver Function Tests: Recent Labs  Lab 03/18/20 0819 03/19/20 0430 03/20/20 0405 03/21/20 0427 03/22/20 0442  AST 9* 7* 10* 24 19  ALT 8 8 9 20 20   ALKPHOS 48 46 44 45 47  BILITOT 0.8 0.9 0.8 1.1 1.0  PROT 6.6 5.9* 5.8* 5.2* 5.0*  ALBUMIN 2.9* 2.5* 2.6* 2.3* 2.2*   Recent Labs  Lab 03/16/20 1445  LIPASE 17   No results for input(s): AMMONIA in the last 168 hours. CBC: Recent Labs  Lab 03/16/20 1445 03/17/20 0557 03/18/20 0819 03/19/20 0430 03/20/20 0405  WBC 11.4* 14.0* 11.9* 11.1* 8.9  NEUTROABS 10.4*  --   --  9.2* 7.1  HGB 12.7* 12.3* 12.7* 12.3* 11.3*  HCT 42.8 40.7 41.5 41.1 37.2*  MCV 84.4 83.7 81.5 83.4 83.8  PLT 408* 429* 392 408* 362   Cardiac Enzymes: No results for input(s): CKTOTAL, CKMB, CKMBINDEX, TROPONINI in the last 168 hours. BNP: Invalid input(s): POCBNP CBG: No results for input(s): GLUCAP in the last 168 hours.  Time coordinating discharge:  36 minutes  Signed:  Orson Eva, DO [facility] Pager: [phone] 03/22/2020, 1:31 PM

## 2020-03-25 ENCOUNTER — Emergency Department (HOSPITAL_COMMUNITY): Payer: Medicare HMO

## 2020-03-25 ENCOUNTER — Other Ambulatory Visit: Payer: Self-pay

## 2020-03-25 ENCOUNTER — Encounter (HOSPITAL_COMMUNITY): Payer: Self-pay | Admitting: Emergency Medicine

## 2020-03-25 ENCOUNTER — Ambulatory Visit: Payer: Medicare HMO

## 2020-03-25 ENCOUNTER — Telehealth: Payer: Self-pay | Admitting: Urology

## 2020-03-25 ENCOUNTER — Inpatient Hospital Stay (HOSPITAL_COMMUNITY)
Admission: EM | Admit: 2020-03-25 | Discharge: 2020-03-29 | DRG: 698 | Disposition: A | Discharge status: Hospice | Payer: Medicare HMO | Source: Skilled Nursing Facility | Attending: Internal Medicine | Admitting: Internal Medicine

## 2020-03-25 DIAGNOSIS — R410 Disorientation, unspecified: Secondary | ICD-10-CM

## 2020-03-25 DIAGNOSIS — C7951 Secondary malignant neoplasm of bone: Secondary | ICD-10-CM | POA: Diagnosis present

## 2020-03-25 DIAGNOSIS — Z923 Personal history of irradiation: Secondary | ICD-10-CM

## 2020-03-25 DIAGNOSIS — E119 Type 2 diabetes mellitus without complications: Secondary | ICD-10-CM | POA: Diagnosis present

## 2020-03-25 DIAGNOSIS — Z515 Encounter for palliative care: Secondary | ICD-10-CM | POA: Diagnosis not present

## 2020-03-25 DIAGNOSIS — K567 Ileus, unspecified: Secondary | ICD-10-CM | POA: Diagnosis present

## 2020-03-25 DIAGNOSIS — I255 Ischemic cardiomyopathy: Secondary | ICD-10-CM | POA: Diagnosis present

## 2020-03-25 DIAGNOSIS — B961 Klebsiella pneumoniae [K. pneumoniae] as the cause of diseases classified elsewhere: Secondary | ICD-10-CM | POA: Diagnosis present

## 2020-03-25 DIAGNOSIS — Z79818 Long term (current) use of other agents affecting estrogen receptors and estrogen levels: Secondary | ICD-10-CM | POA: Diagnosis not present

## 2020-03-25 DIAGNOSIS — N390 Urinary tract infection, site not specified: Secondary | ICD-10-CM

## 2020-03-25 DIAGNOSIS — Z7902 Long term (current) use of antithrombotics/antiplatelets: Secondary | ICD-10-CM | POA: Diagnosis not present

## 2020-03-25 DIAGNOSIS — N135 Crossing vessel and stricture of ureter without hydronephrosis: Secondary | ICD-10-CM

## 2020-03-25 DIAGNOSIS — Z66 Do not resuscitate: Secondary | ICD-10-CM | POA: Diagnosis present

## 2020-03-25 DIAGNOSIS — R456 Violent behavior: Secondary | ICD-10-CM | POA: Diagnosis not present

## 2020-03-25 DIAGNOSIS — H9190 Unspecified hearing loss, unspecified ear: Secondary | ICD-10-CM | POA: Diagnosis present

## 2020-03-25 DIAGNOSIS — E876 Hypokalemia: Secondary | ICD-10-CM | POA: Diagnosis present

## 2020-03-25 DIAGNOSIS — R4182 Altered mental status, unspecified: Secondary | ICD-10-CM | POA: Diagnosis not present

## 2020-03-25 DIAGNOSIS — C779 Secondary and unspecified malignant neoplasm of lymph node, unspecified: Secondary | ICD-10-CM | POA: Diagnosis present

## 2020-03-25 DIAGNOSIS — N136 Pyonephrosis: Secondary | ICD-10-CM | POA: Diagnosis not present

## 2020-03-25 DIAGNOSIS — I480 Paroxysmal atrial fibrillation: Secondary | ICD-10-CM | POA: Diagnosis present

## 2020-03-25 DIAGNOSIS — E782 Mixed hyperlipidemia: Secondary | ICD-10-CM | POA: Diagnosis present

## 2020-03-25 DIAGNOSIS — R1084 Generalized abdominal pain: Secondary | ICD-10-CM

## 2020-03-25 DIAGNOSIS — G8929 Other chronic pain: Secondary | ICD-10-CM | POA: Diagnosis present

## 2020-03-25 DIAGNOSIS — Z79899 Other long term (current) drug therapy: Secondary | ICD-10-CM

## 2020-03-25 DIAGNOSIS — I252 Old myocardial infarction: Secondary | ICD-10-CM | POA: Diagnosis not present

## 2020-03-25 DIAGNOSIS — Z7901 Long term (current) use of anticoagulants: Secondary | ICD-10-CM

## 2020-03-25 DIAGNOSIS — Z808 Family history of malignant neoplasm of other organs or systems: Secondary | ICD-10-CM

## 2020-03-25 DIAGNOSIS — Z7189 Other specified counseling: Secondary | ICD-10-CM | POA: Diagnosis not present

## 2020-03-25 DIAGNOSIS — R319 Hematuria, unspecified: Secondary | ICD-10-CM

## 2020-03-25 DIAGNOSIS — G9341 Metabolic encephalopathy: Secondary | ICD-10-CM | POA: Diagnosis present

## 2020-03-25 DIAGNOSIS — I10 Essential (primary) hypertension: Secondary | ICD-10-CM | POA: Diagnosis not present

## 2020-03-25 DIAGNOSIS — Z85828 Personal history of other malignant neoplasm of skin: Secondary | ICD-10-CM

## 2020-03-25 DIAGNOSIS — Z955 Presence of coronary angioplasty implant and graft: Secondary | ICD-10-CM

## 2020-03-25 DIAGNOSIS — Z20822 Contact with and (suspected) exposure to covid-19: Secondary | ICD-10-CM | POA: Diagnosis not present

## 2020-03-25 DIAGNOSIS — Z7401 Bed confinement status: Secondary | ICD-10-CM

## 2020-03-25 DIAGNOSIS — I2510 Atherosclerotic heart disease of native coronary artery without angina pectoris: Secondary | ICD-10-CM | POA: Diagnosis not present

## 2020-03-25 DIAGNOSIS — K5730 Diverticulosis of large intestine without perforation or abscess without bleeding: Secondary | ICD-10-CM | POA: Diagnosis not present

## 2020-03-25 DIAGNOSIS — Y846 Urinary catheterization as the cause of abnormal reaction of the patient, or of later complication, without mention of misadventure at the time of the procedure: Secondary | ICD-10-CM | POA: Diagnosis present

## 2020-03-25 DIAGNOSIS — N3001 Acute cystitis with hematuria: Secondary | ICD-10-CM | POA: Diagnosis not present

## 2020-03-25 DIAGNOSIS — K219 Gastro-esophageal reflux disease without esophagitis: Secondary | ICD-10-CM | POA: Diagnosis not present

## 2020-03-25 DIAGNOSIS — N139 Obstructive and reflux uropathy, unspecified: Secondary | ICD-10-CM | POA: Diagnosis not present

## 2020-03-25 DIAGNOSIS — N400 Enlarged prostate without lower urinary tract symptoms: Secondary | ICD-10-CM | POA: Diagnosis present

## 2020-03-25 DIAGNOSIS — R531 Weakness: Secondary | ICD-10-CM | POA: Diagnosis not present

## 2020-03-25 DIAGNOSIS — R0902 Hypoxemia: Secondary | ICD-10-CM | POA: Diagnosis not present

## 2020-03-25 DIAGNOSIS — Z9079 Acquired absence of other genital organ(s): Secondary | ICD-10-CM

## 2020-03-25 DIAGNOSIS — Z87891 Personal history of nicotine dependence: Secondary | ICD-10-CM

## 2020-03-25 DIAGNOSIS — T83518A Infection and inflammatory reaction due to other urinary catheter, initial encounter: Principal | ICD-10-CM | POA: Diagnosis present

## 2020-03-25 DIAGNOSIS — N1330 Unspecified hydronephrosis: Secondary | ICD-10-CM | POA: Diagnosis not present

## 2020-03-25 DIAGNOSIS — Z951 Presence of aortocoronary bypass graft: Secondary | ICD-10-CM

## 2020-03-25 DIAGNOSIS — C61 Malignant neoplasm of prostate: Secondary | ICD-10-CM | POA: Diagnosis not present

## 2020-03-25 DIAGNOSIS — Z1611 Resistance to penicillins: Secondary | ICD-10-CM | POA: Diagnosis present

## 2020-03-25 DIAGNOSIS — Z8673 Personal history of transient ischemic attack (TIA), and cerebral infarction without residual deficits: Secondary | ICD-10-CM

## 2020-03-25 DIAGNOSIS — Z800 Family history of malignant neoplasm of digestive organs: Secondary | ICD-10-CM

## 2020-03-25 DIAGNOSIS — K6389 Other specified diseases of intestine: Secondary | ICD-10-CM | POA: Diagnosis not present

## 2020-03-25 DIAGNOSIS — F29 Unspecified psychosis not due to a substance or known physiological condition: Secondary | ICD-10-CM | POA: Diagnosis not present

## 2020-03-25 LAB — CBC WITH DIFFERENTIAL/PLATELET
Abs Immature Granulocytes: 0.2 10*3/uL — ABNORMAL HIGH (ref 0.00–0.07)
Basophils Absolute: 0 10*3/uL (ref 0.0–0.1)
Basophils Relative: 1 %
Eosinophils Absolute: 0.2 10*3/uL (ref 0.0–0.5)
Eosinophils Relative: 2 %
HCT: 36.6 % — ABNORMAL LOW (ref 39.0–52.0)
Hemoglobin: 11.3 g/dL — ABNORMAL LOW (ref 13.0–17.0)
Immature Granulocytes: 3 %
Lymphocytes Relative: 10 %
Lymphs Abs: 0.8 10*3/uL (ref 0.7–4.0)
MCH: 25.1 pg — ABNORMAL LOW (ref 26.0–34.0)
MCHC: 30.9 g/dL (ref 30.0–36.0)
MCV: 81.3 fL (ref 80.0–100.0)
Monocytes Absolute: 0.5 10*3/uL (ref 0.1–1.0)
Monocytes Relative: 6 %
Neutro Abs: 6.4 10*3/uL (ref 1.7–7.7)
Neutrophils Relative %: 78 %
Platelets: 361 10*3/uL (ref 150–400)
RBC: 4.5 MIL/uL (ref 4.22–5.81)
RDW: 16.6 % — ABNORMAL HIGH (ref 11.5–15.5)
WBC: 8.1 10*3/uL (ref 4.0–10.5)
nRBC: 0 % (ref 0.0–0.2)

## 2020-03-25 LAB — URINALYSIS, ROUTINE W REFLEX MICROSCOPIC
Bilirubin Urine: NEGATIVE
Glucose, UA: NEGATIVE mg/dL
Ketones, ur: 5 mg/dL — AB
Nitrite: POSITIVE — AB
Protein, ur: 30 mg/dL — AB
RBC / HPF: >50 RBC/hpf — ABNORMAL HIGH (ref 0–5)
Specific Gravity, Urine: 1.011 (ref 1.005–1.030)
pH: 6 (ref 5.0–8.0)

## 2020-03-25 LAB — SARS CORONAVIRUS 2 BY RT PCR (HOSPITAL ORDER, PERFORMED IN ~~LOC~~ HOSPITAL LAB): SARS Coronavirus 2: NEGATIVE

## 2020-03-25 LAB — COMPREHENSIVE METABOLIC PANEL
ALT: 17 U/L (ref 0–44)
AST: 17 U/L (ref 15–41)
Albumin: 2.4 g/dL — ABNORMAL LOW (ref 3.5–5.0)
Alkaline Phosphatase: 48 U/L (ref 38–126)
Anion gap: 9 (ref 5–15)
BUN: 10 mg/dL (ref 8–23)
CO2: 25 mmol/L (ref 22–32)
Calcium: 8.1 mg/dL — ABNORMAL LOW (ref 8.9–10.3)
Chloride: 100 mmol/L (ref 98–111)
Creatinine, Ser: 0.42 mg/dL — ABNORMAL LOW (ref 0.61–1.24)
GFR calc Af Amer: >60 mL/min (ref >60)
GFR calc non Af Amer: >60 mL/min (ref >60)
Glucose, Bld: 101 mg/dL — ABNORMAL HIGH (ref 70–99)
Potassium: 3.6 mmol/L (ref 3.5–5.1)
Sodium: 134 mmol/L — ABNORMAL LOW (ref 135–145)
Total Bilirubin: 0.8 mg/dL (ref 0.3–1.2)
Total Protein: 5.3 g/dL — ABNORMAL LOW (ref 6.5–8.1)

## 2020-03-25 LAB — LIPASE, BLOOD: Lipase: 33 U/L (ref 11–51)

## 2020-03-25 LAB — LACTIC ACID, PLASMA: Lactic Acid, Venous: 0.9 mmol/L (ref 0.5–1.9)

## 2020-03-25 MED ORDER — ONDANSETRON HCL 4 MG PO TABS: 4.0000 mg | ORAL_TABLET | Freq: Four times a day (QID) | ORAL | Status: DC | PRN

## 2020-03-25 MED ORDER — PANTOPRAZOLE SODIUM 40 MG PO TBEC
40.0000 mg | DELAYED_RELEASE_TABLET | Freq: Every morning | ORAL | Status: DC
  Administered 2020-03-25 – 2020-03-29 (×5): 40 mg via ORAL
  Filled 2020-03-25 (×6): qty 1

## 2020-03-25 MED ORDER — SODIUM CHLORIDE 0.9 % IV SOLN
1.0000 g | INTRAVENOUS | Status: DC
  Administered 2020-03-25 – 2020-03-28 (×4): 1 g via INTRAVENOUS
  Filled 2020-03-25 (×4): qty 10

## 2020-03-25 MED ORDER — ACETAMINOPHEN 650 MG RE SUPP: 650.0000 mg | Freq: Four times a day (QID) | RECTAL | Status: DC | PRN

## 2020-03-25 MED ORDER — ENOXAPARIN SODIUM 40 MG/0.4ML ~~LOC~~ SOLN: 40.0000 mg | SUBCUTANEOUS | Status: DC

## 2020-03-25 MED ORDER — ONDANSETRON HCL 4 MG/2ML IJ SOLN: 4.0000 mg | Freq: Four times a day (QID) | INTRAMUSCULAR | Status: DC | PRN

## 2020-03-25 MED ORDER — CLOPIDOGREL BISULFATE 75 MG PO TABS: 75.0000 mg | ORAL_TABLET | Freq: Every day | ORAL | Status: DC

## 2020-03-25 MED ORDER — ACETAMINOPHEN 325 MG PO TABS
650.0000 mg | ORAL_TABLET | Freq: Four times a day (QID) | ORAL | Status: DC | PRN
  Administered 2020-03-26 – 2020-03-28 (×2): 650 mg via ORAL
  Filled 2020-03-25 (×2): qty 2

## 2020-03-25 MED ORDER — IOHEXOL 300 MG/ML  SOLN
100.0000 mL | Freq: Once | INTRAMUSCULAR | Status: AC | PRN
  Administered 2020-03-25: 100 mL via INTRAVENOUS

## 2020-03-25 MED ORDER — LINACLOTIDE 145 MCG PO CAPS
290.0000 ug | ORAL_CAPSULE | Freq: Every day | ORAL | Status: DC
  Administered 2020-03-26 – 2020-03-29 (×4): 290 ug via ORAL
  Filled 2020-03-25 (×7): qty 2

## 2020-03-25 MED ORDER — ROSUVASTATIN CALCIUM 20 MG PO TABS
20.0000 mg | ORAL_TABLET | Freq: Every evening | ORAL | Status: DC
  Administered 2020-03-25 – 2020-03-27 (×3): 20 mg via ORAL
  Filled 2020-03-25 (×2): qty 1

## 2020-03-25 MED ORDER — HYDROCODONE-ACETAMINOPHEN 5-325 MG PO TABS
1.0000 | ORAL_TABLET | Freq: Every evening | ORAL | Status: DC | PRN
  Administered 2020-03-29: 1 via ORAL
  Filled 2020-03-25: qty 1

## 2020-03-25 MED ORDER — CALCIUM CARBONATE 1250 (500 CA) MG PO TABS
1.0000 | ORAL_TABLET | Freq: Every day | ORAL | Status: DC
  Administered 2020-03-26 – 2020-03-29 (×4): 500 mg via ORAL
  Filled 2020-03-25 (×7): qty 1

## 2020-03-25 MED ORDER — SODIUM CHLORIDE 0.9 % IV SOLN: INTRAVENOUS | Status: DC

## 2020-03-25 MED ORDER — MELATONIN 3 MG PO TABS
9.0000 mg | ORAL_TABLET | Freq: Every day | ORAL | Status: DC
  Administered 2020-03-25 – 2020-03-28 (×4): 9 mg via ORAL
  Filled 2020-03-25 (×4): qty 3

## 2020-03-25 MED ORDER — METOPROLOL TARTRATE 50 MG PO TABS
50.0000 mg | ORAL_TABLET | Freq: Two times a day (BID) | ORAL | Status: DC
  Administered 2020-03-25 – 2020-03-29 (×9): 50 mg via ORAL
  Filled 2020-03-25 (×10): qty 1

## 2020-03-25 MED ORDER — TRAMADOL HCL 50 MG PO TABS
100.0000 mg | ORAL_TABLET | Freq: Four times a day (QID) | ORAL | Status: DC | PRN
  Administered 2020-03-27: 100 mg via ORAL
  Filled 2020-03-25: qty 2

## 2020-03-25 MED ORDER — SODIUM CHLORIDE 0.9 % IV SOLN
1.0000 g | Freq: Once | INTRAVENOUS | Status: AC
  Administered 2020-03-25: 1 g via INTRAVENOUS
  Filled 2020-03-25: qty 10

## 2020-03-25 MED ORDER — BISACODYL 10 MG RE SUPP
10.0000 mg | Freq: Every morning | RECTAL | Status: DC
  Administered 2020-03-25 – 2020-03-27 (×3): 10 mg via RECTAL
  Filled 2020-03-25 (×3): qty 1

## 2020-03-25 MED ORDER — AMLODIPINE BESYLATE 5 MG PO TABS
5.0000 mg | ORAL_TABLET | Freq: Every morning | ORAL | Status: DC
  Administered 2020-03-25 – 2020-03-28 (×4): 5 mg via ORAL
  Filled 2020-03-25 (×5): qty 1

## 2020-03-25 MED ORDER — ISOSORBIDE MONONITRATE ER 60 MG PO TB24
60.0000 mg | ORAL_TABLET | Freq: Every morning | ORAL | Status: DC
  Administered 2020-03-25 – 2020-03-28 (×4): 60 mg via ORAL
  Filled 2020-03-25 (×7): qty 1

## 2020-03-25 MED ORDER — MORPHINE SULFATE (PF) 2 MG/ML IV SOLN
2.0000 mg | INTRAVENOUS | Status: DC | PRN
  Administered 2020-03-28: 2 mg via INTRAVENOUS
  Filled 2020-03-25: qty 1

## 2020-03-25 MED ORDER — POLYETHYLENE GLYCOL 3350 17 G PO PACK
17.0000 g | PACK | Freq: Every day | ORAL | Status: DC
  Administered 2020-03-25 – 2020-03-29 (×5): 17 g via ORAL
  Filled 2020-03-25 (×5): qty 1

## 2020-03-25 MED ORDER — MELATONIN 5 MG PO TABS
10.0000 mg | ORAL_TABLET | Freq: Every day | ORAL | Status: DC
  Filled 2020-03-25 (×2): qty 2

## 2020-03-25 NOTE — Telephone Encounter (Signed)
Pts daughter states he is in the Er at [facility] and they stated that they were going to contact Dr Jeffie Pollock regarding some changes on his imaging. She wanted to make sure Dr Jeffie Pollock is aware.
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
## 2020-03-25 NOTE — Telephone Encounter (Signed)
Please see below.
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
## 2020-03-25 NOTE — ED Notes (Signed)
Pt to CT

## 2020-03-25 NOTE — ED Notes (Signed)
Pt placed on hospital bed for comfort.

## 2020-03-25 NOTE — Progress Notes (Signed)
PROGRESS NOTE    LUMIR PAGEL  [id number] DOB: 29-May-1932 DOA: 03/25/2020 PCP: Glenda Chroman, MD    Brief Narrative:  Patient was admitted to the hospital with the working diagnosis of acute metabolic encephalopathy.  86 year old male with significant past medical history for metastatic prostate cancer, chronic back pain, atrial fibrillation and hypertension. Patient had a recent hospitalization (05/01 to 05/07) for a partial small bowel obstruction, at discharge transferred to skilled nursing facility for rehabilitation [facility]).  On the day of admission patient became confused and altered, apparently patient occasionally has sundowning, but at this time his symptoms were worse than baseline.  On his initial physical examination his temperature was 98.4, blood pressure 151/87, heart rate 84, respiratory 23, oxygen saturation 94% on room air.  Patient was awake and alert, his lungs were clear to auscultation bilaterally, heart S1-S2, present, his abdomen was nondistended, positive generalized symptoms congestion, no lower extremity edema. Sodium 134, potassium 3.6, chloride 100, bicarb 25 glucose 101, BUN 10, creatinine 0.42, white count 8.1, hemoglobin 9.3, hematocrit 36.6, platelets 361.  Urinalysis, large leukocytes, positive nitrates, specific gravity 1.011, more than 50 red cells, 11-20 white cells.   Abdominal x-ray with distended gas-filled loops of small bowel with multiple fluid levels consistent with obstruction. CT of the abdomen and pelvis with interval development of mild bilateral hydronephrosis, likely due to obstruction at the level of the ureterovesical junctions, secondary to mass effect caused by the pelvic mass or due to ureter vesical junction strictures secondary to tumoral invasion.  Possible ileus.  Enlarged and irregular pelvic region of the base of the bladder and anterior rectal wall.   Assessment & Plan:   Principal Problem:   Altered mental status,  unspecified Active Problems:   Essential hypertension, benign   GERD (gastroesophageal reflux disease)   Mixed hyperlipidemia   1. Acute metabolic encephalopathy due to complicated urine infection in the setting of obstructive uropathy. Patient has a suprapubic catheter, obstruction at the level of the ureter pelvic junctions, secondary to mass effect from pelvic mass. His renal function is stable with serum cr at 0,42, K is 3,6 and serum bicarbonate at 25. Wbc is 8,1. Improved mentation this am, but not yet back to baseline.   Will continue hydration with isotonic saline at 75 ml per H, continue antibiotic therapy with IV ceftriaxone. Will follow cell count, cultures and temperature curve. Will consult urology for obstructive uropathy.  Continue neuro checks per unit protocol and supportive medical therapy.   2. Suspected recurrent partial small bowel obstruction. Patient with no nausea or vomiting, his abdomen is soft.   Will continue with clear liquid diet and radiographic follow up in am, if patient develops nausea, vomiting and abdominal distention will need NG tube. Continue IV fluids.   3. Atrial fibrillation. Continue rate control with metoprolol, will continue to hold on anticoagulation including clopidogrel due to hematuria.   4. HTN/ dyslipidemia. Blood pressure 146/81, will continue blood pressure control with amlodipine, isosorbide and metoprolol.   Continue with rosuvastatin.   5. Chronic back pain. Continue pain control with oxycodone and tramadol.     Status is: Inpatient  Remains inpatient appropriate because:IV treatments appropriate due to intensity of illness or inability to take PO   Dispo: The patient is from: SNF              Anticipated d/c is to: SNF              Anticipated d/c date is:  3 days              Patient currently is not medically stable to d/c.        DVT prophylaxis: scd   Code Status:   dnr   Family Communication:  No family at the  bedside      Consultants:   Urology     Antimicrobials:   Ceftriaxone     Subjective: Patient is more calm this am, no nausea or vomiting, no dyspnea or abdominal pain. Continue to be confused and disorientated.   Objective: Vitals:   03/25/20 0515 03/25/20 0530 03/25/20 0545 03/25/20 0600  BP:  (!) 155/87  (!) 146/81  Pulse: 79 77 88 77  Resp:  20  20  Temp:      TempSrc:      SpO2: 97% 97% 96% 95%  Weight:      Height:        Intake/Output Summary (Last 24 hours) at 03/25/2020 0825 Last data filed at 03/25/2020 0425 Gross per 24 hour  Intake 100 ml  Output --  Net 100 ml   Filed Weights   03/25/20 0113  Weight: 70.4 kg    Examination:   General: Not in pain or dyspnea, deconditioned  Neurology: Awake and alert, non focal, confused and disorientated.   E ENT: mild pallor, no icterus, oral mucosa moist Cardiovascular: No JVD. S1-S2 present, rhythmic, no gallops, rubs, or murmurs. No lower extremity edema. Pulmonary: positive breath sounds bilaterally, adequate air movement, no wheezing, rhonchi or rales. Gastrointestinal. Abdomen with no organomegaly, non tender, no rebound or guarding Skin. No rashes Musculoskeletal: no joint deformities     Data Reviewed: I have personally reviewed following labs and imaging studies  CBC: Recent Labs  Lab 03/19/20 0430 03/20/20 0405 03/25/20 0152  WBC 11.1* 8.9 8.1  NEUTROABS 9.2* 7.1 6.4  HGB 12.3* 11.3* 11.3*  HCT 41.1 37.2* 36.6*  MCV 83.4 83.8 81.3  PLT 408* 362 [id number]   Basic Metabolic Panel: Recent Labs  Lab 03/19/20 0430 03/20/20 0405 03/21/20 0427 03/22/20 0442 03/25/20 0152  NA 140 141 141 138 134*  K 3.5 3.3* 3.6 3.9 3.6  CL 104 106 105 102 100  CO2 25 27 28 26 25   GLUCOSE 126* 106* 101* 94 101*  BUN 35* 31* 26* 21 10  CREATININE 0.58* 0.57* 0.53* 0.51* 0.42*  CALCIUM 8.3* 8.2* 8.0* 7.8* 8.1*  MG 2.2 2.4 2.2  --   --    GFR: Estimated Creatinine Clearance: 65.3 mL/min (A) (by C-G  formula based on SCr of 0.42 mg/dL (L)). Liver Function Tests: Recent Labs  Lab 03/19/20 0430 03/20/20 0405 03/21/20 0427 03/22/20 0442 03/25/20 0152  AST 7* 10* 24 19 17   ALT 8 9 20 20 17   ALKPHOS 46 44 45 47 48  BILITOT 0.9 0.8 1.1 1.0 0.8  PROT 5.9* 5.8* 5.2* 5.0* 5.3*  ALBUMIN 2.5* 2.6* 2.3* 2.2* 2.4*   Recent Labs  Lab 03/25/20 0152  LIPASE 33   No results for input(s): AMMONIA in the last 168 hours. Coagulation Profile: No results for input(s): INR, PROTIME in the last 168 hours. Cardiac Enzymes: No results for input(s): CKTOTAL, CKMB, CKMBINDEX, TROPONINI in the last 168 hours. BNP (last 3 results) No results for input(s): PROBNP in the last 8760 hours. HbA1C: No results for input(s): HGBA1C in the last 72 hours. CBG: No results for input(s): GLUCAP in the last 168 hours. Lipid Profile: No results for input(s): CHOL, HDL, LDLCALC,  TRIG, CHOLHDL, LDLDIRECT in the last 72 hours. Thyroid Function Tests: No results for input(s): TSH, T4TOTAL, FREET4, T3FREE, THYROIDAB in the last 72 hours. Anemia Panel: No results for input(s): VITAMINB12, FOLATE, FERRITIN, TIBC, IRON, RETICCTPCT in the last 72 hours.    Radiology Studies: I have reviewed all of the imaging during this hospital visit personally     Scheduled Meds: . amLODipine  5 mg Oral q morning - 10a  . bisacodyl  10 mg Rectal q morning - 10a  . calcium carbonate  1 tablet Oral Daily  . clopidogrel  75 mg Oral Daily  . isosorbide mononitrate  60 mg Oral q morning - 10a  . linaclotide  290 mcg Oral QAC breakfast  . melatonin  9 mg Oral QHS  . metoprolol tartrate  50 mg Oral BID  . pantoprazole  40 mg Oral q morning - 10a  . polyethylene glycol  17 g Oral Daily  . rosuvastatin  20 mg Oral QPM   Continuous Infusions: . sodium chloride 75 mL/hr at 03/25/20 0551  . cefTRIAXone (ROCEPHIN)  IV       LOS: 0 days        Shomari Matusik Gerome Apley, MD

## 2020-03-25 NOTE — TOC Initial Note (Signed)
Transition of Care [facility]) - Initial/Assessment Note    Patient Details  Name: Elijah Santana MRN: [id number] Date of Birth: 1932/10/04  Transition of [facility] CM/SW Contact:    Orilla Templeman Dimitri Ped, LCSW Phone Number: 03/25/2020, 4:02 PM  Clinical Narrative:   Patient is a 86 yo male who was  recently admitted for altered mental status but has a working diagnosis of acute metabolic encephalopathy after leaving [facility].                Pt has a hx of metastatic prostate cancer, chronic back pain, atrial fibrillation and hypertension. Patient had a recent hospitalization (05/01 to 05/07) for a partial small bowel obstruction.   Per last initial TOC note, family's recent wishes are for patient to get strong enough to return home. Patient currently has sitter but no one at home to assist him during the night hours.   Patient currently undergoing continued medical work up. TOC will continue to follow patient for discharge related needs  [facility]  Clinical Social Worker  Ph: [phone]  Expected Discharge Plan: Skilled Nursing Facility Barriers to Discharge: Continued Medical Work up   Patient Goals and CMS Choice Patient states their goals for this hospitalization and ongoing recovery are:: to get well and receive rehab to become strong enough to return home CMS Medicare.gov Compare Post Acute Care list provided to:: Patient Represenative (must comment) Choice offered to / list presented to : Adult Children  Expected Discharge Plan and Services Expected Discharge Plan: [place] In-house Referral: Clinical Social Work Discharge Planning Services: CM Consult   Living arrangements for the past 2 months: [place]                                      Prior Living Arrangements/Services Living arrangements for the past 2 months: Single Family Home Lives with:: Self Patient language and need for  interpreter reviewed:: Yes Do you feel safe going back to the place where you live?: Yes      Need for Family Participation in Patient Care: Yes (Comment) Care giver support system in place?: Yes (comment) Current home services: Sitter Criminal Activity/Legal Involvement Pertinent to Current Situation/Hospitalization: No - Comment as needed  Activities of Daily Living Home Assistive Devices/Equipment: Environmental consultant (specify type) ADL Screening (condition at time of admission) Patient's cognitive ability adequate to safely complete daily activities?: Yes Is the patient deaf or have difficulty hearing?: No Does the patient have difficulty seeing, even when wearing glasses/contacts?: No Does the patient have difficulty concentrating, remembering, or making decisions?: Yes Patient able to express need for assistance with ADLs?: Yes Does the patient have difficulty dressing or bathing?: Yes Independently performs ADLs?: No Communication: Independent Dressing (OT): Needs assistance Is this a change from baseline?: Pre-admission baseline Grooming: Needs assistance Is this a change from baseline?: Pre-admission baseline Feeding: Independent Bathing: Needs assistance Is this a change from baseline?: Pre-admission baseline Toileting: Needs assistance Is this a change from baseline?: Pre-admission baseline In/Out Bed: Needs assistance Is this a change from baseline?: Pre-admission baseline Walks in Home: Needs assistance Is this a change from baseline?: Pre-admission baseline Does the patient have difficulty walking or climbing stairs?: Yes Weakness of Legs: Both Weakness of Arms/Hands: None  Permission Sought/Granted Permission sought to share information with : Case Manager Permission granted to share information with : Yes,  Release of Information Signed  Share Information with NAME: Delene Ruffini  Permission granted to share info w AGENCY: [place] granted to share info w  Relationship: Daughter  Permission granted to share info w Contact Information: [phone]  Emotional Assessment       Orientation: : Fluctuating Orientation (Suspected and/or reported Sundowners) Alcohol / Substance Use: Not Applicable Psych Involvement: No (comment)  Admission diagnosis:  Generalized abdominal pain [R10.84] Disorientation [R41.0] Bilateral ureteral obstruction [N13.5] Urinary tract infection with hematuria, site unspecified [N39.0, R31.9] Altered mental status, unspecified [R41.82] Patient Active Problem List   Diagnosis Date Noted  . Altered mental status, unspecified 03/25/2020  . Palliative care by specialist   . Paroxysmal atrial fibrillation ([place]) 03/17/2020  . Suprapubic catheter ([place]) 03/17/2020  . Small bowel obstruction due to adhesions ([place]) 03/16/2020  . Goals of care, counseling/discussion 10/25/2019  . Family history of pancreatic cancer   . Family history of brain cancer   . Rectal pain 08/08/2019  . Lower abdominal pain 08/08/2019  . Urinary retention 03/24/2019  . Bilateral hydronephrosis 03/24/2019  . Uncontrolled hypertension 03/24/2019  . Acute lower UTI 03/24/2019  . Anemia 03/24/2019  . Bone metastasis ([place]) 04/08/2018  . Prostate cancer ([place]) 11/27/2017  . Atrial fibrillation, rapid -new 09/10/16 09/10/2016  . PAF- in setting of NSTEMI 09/10/2016  . Accelerating angina ([place])   . CAD S/P percutaneous coronary angioplasty   . Coronary artery disease with hx of myocardial infarct w/o hx of CABG 03/21/2015  . Ischemic cardiomyopathy   . Hyperglycemia   . Essential hypertension   . BPH (benign prostatic hyperplasia)   . NSTEMI- Troponin peak 6/47 12/31/2014  . S/P CABG x 4 2004 12/31/2014  . GERD (gastroesophageal reflux disease) 04/29/2012  . Mixed hyperlipidemia   . Essential hypertension, benign 08/06/2009   PCP:  Glenda Chroman, MD Pharmacy:   [facility] [place], [place] [address] Phone: [phone] Fax: [phone]  [place], [place] [address] Phone: [phone] Fax: [place], [place] [address] Phone: [phone] Fax: [phone]     Social Determinants of Health ([place]) Interventions    Readmission Risk Interventions Readmission Risk Prevention Plan 03/19/2020  Transportation Screening Complete  Medication Review ([place]) Complete  PCP or Specialist appointment within 3-5 days of discharge Not Complete  HRI or Home Care Consult Complete  SW Recovery Care/Counseling Consult Complete  Palliative Care Screening Not Applicable  Skilled Nursing Facility Complete  Some recent data might be hidden
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
## 2020-03-26 ENCOUNTER — Telehealth: Payer: Self-pay | Admitting: Radiation Oncology

## 2020-03-26 ENCOUNTER — Ambulatory Visit: Payer: Medicare HMO

## 2020-03-26 ENCOUNTER — Other Ambulatory Visit: Payer: Self-pay | Admitting: *Deleted

## 2020-03-26 DIAGNOSIS — E782 Mixed hyperlipidemia: Secondary | ICD-10-CM

## 2020-03-26 NOTE — Progress Notes (Signed)
PROGRESS NOTE    Elijah Santana  [id number] DOB: [birth year] DOA: 03/25/2020 PCP: Glenda Chroman, MD    Brief Narrative:  Patient was admitted to the hospital with the working diagnosis of acute metabolic encephalopathy.  86 year old male with significant past medical history for metastatic prostate cancer, chronic back pain, atrial fibrillation and hypertension. Patient had a recent hospitalization (05/01 to 05/07) for a partial small bowel obstruction, at discharge transferred to skilled nursing facility for rehabilitation [facility]).  On the day of admission patient became confused and altered, apparently patient occasionally has sundowning, but at this time his symptoms were worse than baseline.  On his initial physical examination his temperature was 98.4, blood pressure 151/87, heart rate 84, respiratory 23, oxygen saturation 94% on room air.  Patient was awake and alert, his lungs were clear to auscultation bilaterally, heart S1-S2, present, his abdomen was nondistended, positive generalized symptoms congestion, no lower extremity edema. Sodium 134, potassium 3.6, chloride 100, bicarb 25 glucose 101, BUN 10, creatinine 0.42, white count 8.1, hemoglobin 9.3, hematocrit 36.6, platelets 361.  Urinalysis, large leukocytes, positive nitrates, specific gravity 1.011, more than 50 red cells, 11-20 white cells.   Abdominal x-ray with distended gas-filled loops of small bowel with multiple fluid levels consistent with obstruction. CT of the abdomen and pelvis with interval development of mild bilateral hydronephrosis, likely due to obstruction at the level of the ureterovesical junctions, secondary to mass effect caused by the pelvic mass or due to ureter vesical junction strictures secondary to tumoral invasion.  Possible ileus.  Enlarged and irregular pelvic region of the base of the bladder and anterior rectal wall.  Case discussed with [name] from urology, patient with advance and  progressive disease, poor prognosis. No invasive intervention recommended. Patient candidate for palliative care services.   Assessment & Plan:   Principal Problem:   Altered mental status, unspecified Active Problems:   Essential hypertension, benign   GERD (gastroesophageal reflux disease)   Mixed hyperlipidemia    1. Acute metabolic encephalopathy due to complicated urinary tract infection in the setting of obstructive uropathy/ metasatic prostate cancer. Positive suprapubic catheter, now with obstruction at the level of the ureter pelvic junctions, secondary to mass effect from pelvic mass. Renal function with serum cr at 0,42 with K at 3,6. Urine culture positive for Klebsiella.   Patient with poor oral intake, will continue hydration with isotonic saline at 50 ml per H. Antibiotic therapy with IV ceftriaxone.   His mentation continue to improve, but not yet back to his baseline, her daughter is at the bedside and confirms patient is not at his baseline.   In terms of his prostate cancer, now is very advanced and progressive, with obstructive uropathy and invading the rectum. Case discussed with [name] over the phone and patient is candidate for palliative care, not candidate for invasive interventions.   2. Suspected recurrent partial small bowel obstruction. No further abdominal pain, no nausea or vomiting. Positive bowel movement.   Will advance diet to soft and will continue to monitor patient's response.   3. Atrial fibrillation. On metoprolol for heart rate control, continue to hold on anticoagulation for now due to risk of bleeding (hematuria).   4. HTN/ dyslipidemia. Continue blood pressure control with amlodipine, isosorbide and metoprolol.   On rosuvastatin.   5. Chronic back pain. On hydrocodone and tramadol for back pain. Will consult PT and OT for evaluation. Patient is very weak and deconditioned.     Status is:  Inpatient  Remains inpatient  appropriate because:IV treatments appropriate due to intensity of illness or inability to take PO   Dispo: The patient is from: SNF              Anticipated d/c is to: SNF              Anticipated d/c date is: 3 days              Patient currently is not medically stable to d/c.        DVT prophylaxis: Enoxaparin  Code Status:   dnr   Family Communication:  I spoke with her daughter at the bedside, we talked about Mr. Eichelberger, progressive prostate cancer, with invasion to the rectum and bilateral ureters.  I talked to her about my conversation with [name] from urology.  She agrees and proceeding with further palliative care.  Social worker has been consulted for appropriate placement.  I also have consulted palliative care team.   Consultants:   Urology    Antimicrobials:   Ceftriaxone     Subjective: Patient is more awake and alert, but not yet back to baseline, poor oral intake and very weak and deconditioned, no nausea or vomiting, no abdominal pain, positive bowel movement,   Objective: Vitals:   03/25/20 2200 03/26/20 0550 03/26/20 0900 03/26/20 1410  BP: 116/65 (!) 148/75  121/69  Pulse: 75 72  68  Resp: 20 20  19   Temp: 98.7 F (37.1 C) 97.7 F (36.5 C)  98 F (36.7 C)  TempSrc: Oral Oral  Oral  SpO2: 94% 95% 94% 97%  Weight:      Height:        Intake/Output Summary (Last 24 hours) at 03/26/2020 1449 Last data filed at 03/26/2020 1300 Gross per 24 hour  Intake 720 ml  Output 2400 ml  Net -1680 ml   Filed Weights   03/25/20 0113  Weight: 70.4 kg    Examination:   General: Not in pain or dyspnea, deconditioned and ill looking appearing  Neurology: Awake and alert, non focal.  E ENT: mild pallor, no icterus, oral mucosa moist Cardiovascular: No JVD. S1-S2 present, rhythmic, no gallops, rubs, or murmurs. No lower extremity edema. Pulmonary: positive breath sounds bilaterally, poor air movement, no wheezing, rhonchi or rales. Gastrointestinal.  Abdomen with no organomegaly, non tender, no rebound or guarding. Suprapubic cathter in place.  Skin. No rashes Musculoskeletal: no joint deformities     Data Reviewed: I have personally reviewed following labs and imaging studies  CBC: Recent Labs  Lab 03/20/20 0405 03/25/20 0152  WBC 8.9 8.1  NEUTROABS 7.1 6.4  HGB 11.3* 11.3*  HCT 37.2* 36.6*  MCV 83.8 81.3  PLT 362 [id number]   Basic Metabolic Panel: Recent Labs  Lab 03/20/20 0405 03/21/20 0427 03/22/20 0442 03/25/20 0152  NA 141 141 138 134*  K 3.3* 3.6 3.9 3.6  CL 106 105 102 100  CO2 27 28 26 25   GLUCOSE 106* 101* 94 101*  BUN 31* 26* 21 10  CREATININE 0.57* 0.53* 0.51* 0.42*  CALCIUM 8.2* 8.0* 7.8* 8.1*  MG 2.4 2.2  --   --    GFR: Estimated Creatinine Clearance: 65.3 mL/min (A) (by C-G formula based on SCr of 0.42 mg/dL (L)). Liver Function Tests: Recent Labs  Lab 03/20/20 0405 03/21/20 0427 03/22/20 0442 03/25/20 0152  AST 10* 24 19 17   ALT 9 20 20 17   ALKPHOS 44 45 47 48  BILITOT 0.8 1.1  1.0 0.8  PROT 5.8* 5.2* 5.0* 5.3*  ALBUMIN 2.6* 2.3* 2.2* 2.4*   Recent Labs  Lab 03/25/20 0152  LIPASE 33   No results for input(s): AMMONIA in the last 168 hours. Coagulation Profile: No results for input(s): INR, PROTIME in the last 168 hours. Cardiac Enzymes: No results for input(s): CKTOTAL, CKMB, CKMBINDEX, TROPONINI in the last 168 hours. BNP (last 3 results) No results for input(s): PROBNP in the last 8760 hours. HbA1C: No results for input(s): HGBA1C in the last 72 hours. CBG: No results for input(s): GLUCAP in the last 168 hours. Lipid Profile: No results for input(s): CHOL, HDL, LDLCALC, TRIG, CHOLHDL, LDLDIRECT in the last 72 hours. Thyroid Function Tests: No results for input(s): TSH, T4TOTAL, FREET4, T3FREE, THYROIDAB in the last 72 hours. Anemia Panel: No results for input(s): VITAMINB12, FOLATE, FERRITIN, TIBC, IRON, RETICCTPCT in the last 72 hours.    Radiology Studies: I have  reviewed all of the imaging during this hospital visit personally     Scheduled Meds: . amLODipine  5 mg Oral q morning - 10a  . bisacodyl  10 mg Rectal q morning - 10a  . calcium carbonate  1 tablet Oral Daily  . isosorbide mononitrate  60 mg Oral q morning - 10a  . linaclotide  290 mcg Oral QAC breakfast  . melatonin  9 mg Oral QHS  . metoprolol tartrate  50 mg Oral BID  . pantoprazole  40 mg Oral q morning - 10a  . polyethylene glycol  17 g Oral Daily  . rosuvastatin  20 mg Oral QPM   Continuous Infusions: . sodium chloride 75 mL/hr at 03/26/20 0724  . cefTRIAXone (ROCEPHIN)  IV 1 g (03/25/20 2235)     LOS: 1 day        Sincere Liuzzi Gerome Apley, MD

## 2020-03-26 NOTE — Progress Notes (Signed)
CSW in contact with Pts daughter Shauna Hugh who expresses that she does not want patient to return to [facility] as she is displeased with their lack of communication. When stable, and if included in the patients plan of care, [name] would like to have patient sent out to other SNFs and to broadend the search to [facility] and [employer].   Bartholomew Transitions of Care  Clinical Social Worker  Ph: [phone]

## 2020-03-26 NOTE — Clinical Social Work Note (Signed)
Per family's request, FL2 was sent to other SNFs in [place], [place], and [place] areas as family no longer wants patient at [facility].  Tobi Bastos, LCSW Transitions of Care Clinical Social Worker [facility] Ph: [phone]

## 2020-03-26 NOTE — Patient Outreach (Signed)
[facility]) Care Management  03/26/2020  Elijah Santana [birth year] [id number]  Care Coordination  Transition of care Referral   Referral date : 03/25/20 Referral source : [facility] Discharge notification  Date to Discharge: 03/22/20 Facility : From [facility] to [facility].  Diagnosis : Partial Small bowel obstruction  Insurance: [employer]   Received referral , noted patient was readmitted to [facility] on 03/25/20 from [facility].    Plan Will follow progress and disposition plan. Will send [facility] Liaison in basket message regarding patient readmission .    Joylene Draft, RN, BSN  [place] Management Coordinator  [phone]- Mobile [phone]- Toll Free Main Office

## 2020-03-26 NOTE — TOC Progression Note (Signed)
Transition of Care [facility]) - Progression Note    Patient Details  Name: Elijah Santana MRN: [id number] Date of Birth: Mar 19, 1932  Transition of Care [facility]) CM/SW [place], LCSW Phone Number: 03/26/2020, 4:33 PM  Clinical Narrative:  CSW in contact with provider to discuss short term and on going plan of care for patient. Provider explains that short term goals consist of pain management and getting the patient to tolerate foods. Patients long term and discharge plan will involve patient being followed by hospice care while receiving palliative/comfort care in a hospice/SNF facility.   CSW later in conversation with patients daughter to confirm the above stated information. [name] would like time to discuss discharge locations with her sister.   CSW in contact with Cassandra from [facility] to inquire about any available beds. Cassandra reports that there are currently no beds available at this time. Vito Backers goes into detail and states that if patient has a 6-8 week prognosis then he could be followed at the hospital through a GIP/virutal bed.   TOC team will continue to follow patient for discharge related needs  [place] Transitions of Care  Clinical Social Worker  Ph: [phone]     Expected Discharge Plan: [place] Barriers to Discharge: Continued Medical Work up  Expected Discharge Plan and Services Expected Discharge Plan: [place] In-house Referral: Clinical Social Work Discharge Planning Services: CM Consult   Living arrangements for the past 2 months: [place]                                       Social Determinants of Health (SDOH) Interventions    Readmission Risk Interventions Readmission Risk Prevention Plan 03/19/2020  Transportation Screening Complete  Medication Review [employer]) Complete  PCP or Specialist appointment within 3-5 days of  discharge Not Complete  HRI or Home Care Consult Complete  SW Recovery Care/Counseling Consult Complete  Palliative Care Screening Not Applicable  Skilled Nursing Facility Complete  Some recent data might be hidden

## 2020-03-26 NOTE — Telephone Encounter (Signed)
Patient to receive 10 radiation treatment to his pelvic region to manage prostate cancer invading rectal wall. Unfortunately, 4 treatments in the patient presented to [facility] ER with a small bowel blockage. Patient discharged on Friday night to [facility] but readmitted to [facility] Sunday morning with UTI, altered mental status and a bowel infection.   With six treatments left I phoned patient's daughter, [name] to inquire about status. She reports her father is very weak, has weight loss (down to 150lb), and can't get out of bed (due to weakness). [name] explains that she is told by physicians at [facility] that recent scans reveal changes "like the tumor has grown more." [name] confirms her father remains at [facility] without a potential discharge date. [name] request [name] review her father's recent scans and provide "a game plan update."   [name] understands this RN will phone her back with directions from [name].

## 2020-03-27 ENCOUNTER — Ambulatory Visit: Payer: Medicare HMO

## 2020-03-27 DIAGNOSIS — Z7189 Other specified counseling: Secondary | ICD-10-CM

## 2020-03-27 DIAGNOSIS — N135 Crossing vessel and stricture of ureter without hydronephrosis: Secondary | ICD-10-CM

## 2020-03-27 DIAGNOSIS — Z515 Encounter for palliative care: Secondary | ICD-10-CM

## 2020-03-27 DIAGNOSIS — C61 Malignant neoplasm of prostate: Secondary | ICD-10-CM

## 2020-03-27 DIAGNOSIS — R319 Hematuria, unspecified: Secondary | ICD-10-CM

## 2020-03-27 DIAGNOSIS — Z66 Do not resuscitate: Secondary | ICD-10-CM

## 2020-03-27 DIAGNOSIS — R531 Weakness: Secondary | ICD-10-CM

## 2020-03-27 DIAGNOSIS — N390 Urinary tract infection, site not specified: Secondary | ICD-10-CM

## 2020-03-27 LAB — CBC WITH DIFFERENTIAL/PLATELET
Abs Immature Granulocytes: 0.14 10*3/uL — ABNORMAL HIGH (ref 0.00–0.07)
Basophils Absolute: 0 10*3/uL (ref 0.0–0.1)
Basophils Relative: 0 %
Eosinophils Absolute: 0.2 10*3/uL (ref 0.0–0.5)
Eosinophils Relative: 2 %
HCT: 34.8 % — ABNORMAL LOW (ref 39.0–52.0)
Hemoglobin: 10.5 g/dL — ABNORMAL LOW (ref 13.0–17.0)
Immature Granulocytes: 2 %
Lymphocytes Relative: 11 %
Lymphs Abs: 1 10*3/uL (ref 0.7–4.0)
MCH: 24.9 pg — ABNORMAL LOW (ref 26.0–34.0)
MCHC: 30.2 g/dL (ref 30.0–36.0)
MCV: 82.7 fL (ref 80.0–100.0)
Monocytes Absolute: 0.6 10*3/uL (ref 0.1–1.0)
Monocytes Relative: 6 %
Neutro Abs: 7.5 10*3/uL (ref 1.7–7.7)
Neutrophils Relative %: 79 %
Platelets: 297 10*3/uL (ref 150–400)
RBC: 4.21 MIL/uL — ABNORMAL LOW (ref 4.22–5.81)
RDW: 16.6 % — ABNORMAL HIGH (ref 11.5–15.5)
WBC: 9.5 10*3/uL (ref 4.0–10.5)
nRBC: 0 % (ref 0.0–0.2)

## 2020-03-27 LAB — URINE CULTURE
Culture: >=100000 — AB
Special Requests: NORMAL

## 2020-03-27 LAB — BASIC METABOLIC PANEL
Anion gap: 8 (ref 5–15)
BUN: 6 mg/dL — ABNORMAL LOW (ref 8–23)
CO2: 24 mmol/L (ref 22–32)
Calcium: 7.8 mg/dL — ABNORMAL LOW (ref 8.9–10.3)
Chloride: 105 mmol/L (ref 98–111)
Creatinine, Ser: 0.43 mg/dL — ABNORMAL LOW (ref 0.61–1.24)
GFR calc Af Amer: >60 mL/min (ref >60)
GFR calc non Af Amer: >60 mL/min (ref >60)
Glucose, Bld: 112 mg/dL — ABNORMAL HIGH (ref 70–99)
Potassium: 2.8 mmol/L — ABNORMAL LOW (ref 3.5–5.1)
Sodium: 137 mmol/L (ref 135–145)

## 2020-03-27 MED ORDER — CHLORHEXIDINE GLUCONATE CLOTH 2 % EX PADS
6.0000 | MEDICATED_PAD | Freq: Every day | CUTANEOUS | Status: DC
  Administered 2020-03-27 – 2020-03-29 (×3): 6 via TOPICAL

## 2020-03-27 MED ORDER — ENSURE ENLIVE PO LIQD
237.0000 mL | Freq: Two times a day (BID) | ORAL | Status: DC
  Administered 2020-03-27 – 2020-03-29 (×4): 237 mL via ORAL

## 2020-03-27 MED ORDER — LIDOCAINE 5 % EX PTCH
1.0000 | MEDICATED_PATCH | CUTANEOUS | Status: DC
  Administered 2020-03-27 – 2020-03-29 (×3): 1 via TRANSDERMAL
  Filled 2020-03-27 (×3): qty 1

## 2020-03-27 MED ORDER — BOOST / RESOURCE BREEZE PO LIQD CUSTOM
1.0000 | Freq: Every day | ORAL | Status: DC
  Administered 2020-03-28 – 2020-03-29 (×2): 1 via ORAL

## 2020-03-27 NOTE — Care Management Important Message (Signed)
Important Message  Patient Details  Name: Elijah Santana MRN: [id number] Date of Birth: [birth year]   Medicare Important Message Given:  Yes     [name] 03/27/2020, 4:24 PM

## 2020-03-27 NOTE — Plan of Care (Signed)
  Problem: Acute Rehab PT Goals(only PT should resolve) Goal: Pt Will Go Supine/Side To Sit Outcome: Progressing Flowsheets (Taken 03/27/2020 1154) Pt will go Supine/Side to Sit: with supervision Goal: Patient Will Transfer Sit To/From Stand Outcome: Progressing Flowsheets (Taken 03/27/2020 1154) Patient will transfer sit to/from stand: with min guard assist Goal: Pt Will Transfer Bed To Chair/Chair To Bed Outcome: Progressing Flowsheets (Taken 03/27/2020 1154) Pt will Transfer Bed to Chair/Chair to Bed: min guard assist Goal: Pt Will Ambulate Outcome: Progressing Flowsheets (Taken 03/27/2020 1154) Pt will Ambulate:  75 feet  with minimal assist  with rolling walker   11:55 AM, 03/27/20 Lonell Grandchild, MPT Physical Therapist with [facility] 336 [phone] office [phone] mobile phone

## 2020-03-27 NOTE — Progress Notes (Signed)
OT Cancellation Note  Patient Details Name: Elijah Santana MRN: [id number] DOB: 10-10-1932   Cancelled Treatment:    Reason Eval/Treat Not Completed: Patient at procedure or test/ unavailable. Pt eating breakfast on OT arrival, requested to complete evaluation later after he was finished. Stating it would probably take about an hour for him to eat. Will check back as schedule allows or tomorrow.    Guadelupe Sabin, OTR/L  [phone] 03/27/2020, 8:38 AM

## 2020-03-27 NOTE — Progress Notes (Signed)
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 The patient was recently hospitalized from 03/16/2020 to 03/22/2020 secondary to a partial small bowel obstruction.  At the time of discharge, the patient was alert and oriented x3 although he would have episodes of sundowning during the hospitalization.  Nevertheless, the patient became increasingly confused at the skilled nursing facility.  As result he was transferred to the emergency department for further evaluation. UA shows 11-20 WBC with urine culture growing Klebsiella pneumonia.  CT of the abdomen and pelvis showed dilated multiple small bowel loops up to 3.3 cm without a transition zone.  There was a enhancing prostate mass measuring 4 x 5 cm with invasion to the base of the bladder as well as a rectal wall with ulceration.  This was similar to her prior CTs.  There is also some mild bilateral hydronephrosis and new small bilateral pleural effusions. The patient was started on IV ceftriaxone.  Palliative medicine was consulted to assist with management.     Assessment/Plan: Acute metabolic encephalopathy -due to CAUTI -he has suprapubic catheter -03/25/20 CT abd--obstruction at the level of the ureter pelvic junctions, secondary to mass effect from pelvic mass  UTI/CAUTI--Klebsiella -continue ceftriaxone  Metastatic prostate cancer -Patient finished 4 days of radiation prior to last admission CTAP from 02/22/2020 which shows prostatic mass stable at 5.7 x 4.7 cm. However appears to increasingly directly invade the anterior mid  to lower rectal wall. Bone metastasis in the spine and pelvic girdle are stable with no new metastasis. No adenopathy. -Enzalutamide started on 02/09/2019 -follow up Dr. Petra Kuba with him 5/6-->restart Gillermina Phy  -follow up with Dr. Irine Seal, in [place] office 04/05/20 at 2PM to determine continuation of Lupron injections -pt missed 3 appointments in Jan 2021 -[name] spoke with urology, Dr. Doyne Keel a candidate for invasive intervention  Partial small bowel obstruction/Ileus -Continue IV fluids -03/25/2020 CT abdomen--dilated multiple small bowel loops up to 3.3 cm without transition zone. -miralax and bisacodyl daily -goal is for soft stool daily due to prostate cancer pushing onto rectum -not an operative candidate -03/27/20--had BM  Essential hypertension -Continue IV Lopressor until the patient is able to tolerate p.o. reliably>>>po metoprolol tartrate -Continue amlodipine  Coronary artery disease -No chest pain presently -Continue imdur -continue metoprolol -continue plavix  Hyperlipidemia -Continue statin  Diabetes mellitus type 2 -Holding metformin during hospitalization -CBGs controlled -allow for liberal control at this point  Metastatic prostate cancer -Patient finished 4 days of radiation prior to last admission CTAP from 02/22/2020 which shows prostatic mass stable at 5.7 x 4.7 cm. However appears to increasingly directly invade the anterior mid to lower rectal wall. Bone metastasis in the spine and pelvic girdle are stable with no new metastasis. No adenopathy. -Enzalutamide started on 02/09/2019 -follow up Dr. Petra Kuba with him 5/6-->restart Gillermina Phy  -follow up with Dr. Irine Seal, in [place] office 04/05/20 at 2PM to determine continuation of Lupron injections -pt missed 3 appointments in Jan 2021 -03/25/20 CTabd--prostate mass pushing on bladder and causing obstructive uropathy and also pushing on colon -[name] spoke  with urology, Dr.  Manny-->not a candidate for invasive intervention  Hypokalemia -replete -check mag  Goals of Care -DNR -palliative medicine following -family leaning toward residential hospice    Total time spent 35 minutes.  Greater than 50% spent face to face counseling and coordinating care.      Disposition Plan: Patient From:SNF D/C Place: SNF vs residential hospice Barriers: Not Clinically Stable--remains confused  Family Communication:   Daughter updated at bedside 5/12  Consultants:  Palliative medicine; urology by phone  Code Status:  DNR  DVT Prophylaxis:  SCDs   Procedures: As Listed in Progress Note Above  Antibiotics: Ceftriaxone 5/10>>>     Subjective: Pt complains of abd pain.  Patient denies fevers, chills, headache, chest pain, dyspnea, nausea, vomiting, diarrhea, dysuria,    Objective: Vitals:   03/26/20 1410 03/26/20 1930 03/26/20 2041 03/27/20 0452  BP: 121/69  (!) 147/72 (!) 156/80  Pulse: 68  76 72  Resp: 19  18 16   Temp: 98 F (36.7 C)  97.8 F (36.6 C) 98.9 F (37.2 C)  TempSrc: Oral  Oral Oral  SpO2: 97% 95% 99% 94%  Weight:      Height:        Intake/Output Summary (Last 24 hours) at 03/27/2020 1008 Last data filed at 03/27/2020 0900 Gross per 24 hour  Intake 3908.09 ml  Output 3600 ml  Net 308.09 ml   Weight change:  Exam:   General:  Pt is alert, follows commands appropriately, not in acute distress  HEENT: No icterus, No thrush, No neck mass, [place]/AT  Cardiovascular: RRR, S1/S2, no rubs, no gallops  Respiratory: bibasilar crackles. No wheeze  Abdomen: Soft/+BS, non tender, non distended, no guarding  Extremities: 1+LE edema, No lymphangitis, No petechiae, No rashes, no synovitis   Data Reviewed: I have personally reviewed following labs and imaging studies Basic Metabolic Panel: Recent Labs  Lab 03/21/20 0427 03/22/20 0442 03/25/20 0152 03/27/20 0507  NA 141 138 134* 137  K 3.6 3.9 3.6 2.8*    CL 105 102 100 105  CO2 28 26 25 24   GLUCOSE 101* 94 101* 112*  BUN 26* 21 10 6*  CREATININE 0.53* 0.51* 0.42* 0.43*  CALCIUM 8.0* 7.8* 8.1* 7.8*  MG 2.2  --   --   --    Liver Function Tests: Recent Labs  Lab 03/21/20 0427 03/22/20 0442 03/25/20 0152  AST 24 19 17   ALT 20 20 17   ALKPHOS 45 47 48  BILITOT 1.1 1.0 0.8  PROT 5.2* 5.0* 5.3*  ALBUMIN 2.3* 2.2* 2.4*   Recent Labs  Lab 03/25/20 0152  LIPASE 33   No results for input(s): AMMONIA in the last 168 hours. Coagulation Profile: No results for input(s): INR, PROTIME in the last 168 hours. CBC: Recent Labs  Lab 03/25/20 0152 03/27/20 0507  WBC 8.1 9.5  NEUTROABS 6.4 7.5  HGB 11.3* 10.5*  HCT 36.6* 34.8*  MCV 81.3 82.7  PLT 361 297   Cardiac Enzymes: No results for input(s): CKTOTAL, CKMB, CKMBINDEX, TROPONINI in the last 168 hours. BNP: Invalid input(s): POCBNP CBG: No results for input(s): GLUCAP in the last 168 hours. HbA1C: No results for input(s): HGBA1C in the last 72 hours. Urine analysis:    Component Value Date/Time   COLORURINE YELLOW 03/25/2020 0133   APPEARANCEUR CLOUDY (A) 03/25/2020 0133   LABSPEC 1.011 03/25/2020 0133   PHURINE 6.0 03/25/2020 0133   GLUCOSEU NEGATIVE 03/25/2020 0133   HGBUR SMALL (A) 03/25/2020 0133   BILIRUBINUR NEGATIVE 03/25/2020 0133  KETONESUR 5 (A) 03/25/2020 0133   PROTEINUR 30 (A) 03/25/2020 0133   NITRITE POSITIVE (A) 03/25/2020 0133   LEUKOCYTESUR LARGE (A) 03/25/2020 0133   Sepsis Labs: @LABRCNTIP (procalcitonin:4,lacticidven:4) ) Recent Results (from the past 240 hour(s))  MRSA PCR Screening     Status: Abnormal   Collection Time: 03/20/20  3:32 PM   Specimen: Nasal Mucosa; Nasopharyngeal  Result Value Ref Range Status   MRSA by PCR POSITIVE (A) NEGATIVE Final    Comment:        The GeneXpert MRSA Assay (FDA approved for NASAL specimens only), is one component of a comprehensive MRSA colonization surveillance program. It is not intended to  diagnose MRSA infection nor to guide or monitor treatment for MRSA infections. RESULT CALLED TO, READ BACK BY AND VERIFIED WITH: [place] ON 03/20/20 AT 2150 BY LOY,C Performed at [facility], [address], [place], [place] [address]   Urine culture     Status: Abnormal   Collection Time: 03/25/20  1:33 AM   Specimen: Urine, Catheterized  Result Value Ref Range Status   Specimen Description   Final    URINE, CATHETERIZED Performed at [facility], [address], [place], [place] [address]    Special Requests   Final    Normal Performed at [facility], [address] [place], [place] [address]    Culture >=100,000 COLONIES/mL KLEBSIELLA PNEUMONIAE (A)  Final   Report Status 03/27/2020 FINAL  Final   Organism ID, Bacteria KLEBSIELLA PNEUMONIAE (A)  Final      Susceptibility   Klebsiella pneumoniae - MIC*    AMPICILLIN RESISTANT Resistant     CEFAZOLIN <=4 SENSITIVE Sensitive     CEFTRIAXONE <=1 SENSITIVE Sensitive     CIPROFLOXACIN <=0.25 SENSITIVE Sensitive     GENTAMICIN <=1 SENSITIVE Sensitive     IMIPENEM <=0.25 SENSITIVE Sensitive     NITROFURANTOIN 64 INTERMEDIATE Intermediate     TRIMETH/SULFA <=20 SENSITIVE Sensitive     AMPICILLIN/SULBACTAM 4 SENSITIVE Sensitive     PIP/TAZO <=4 SENSITIVE Sensitive     * >=100,000 COLONIES/mL KLEBSIELLA PNEUMONIAE  SARS Coronavirus 2 by RT PCR (hospital order, performed in [place] hospital lab) Nasopharyngeal Nasopharyngeal Swab     Status: None   Collection Time: 03/25/20  9:27 AM   Specimen: Nasopharyngeal Swab  Result Value Ref Range Status   SARS Coronavirus 2 NEGATIVE NEGATIVE Final    Comment: (NOTE) SARS-CoV-2 target nucleic acids are NOT DETECTED. The SARS-CoV-2 RNA is generally detectable in upper and lower respiratory specimens during the acute phase of infection. The lowest concentration of SARS-CoV-2 viral copies this assay can detect is 250 copies / mL. A negative result does not preclude SARS-CoV-2  infection and should not be used as the sole basis for treatment or other patient management decisions.  A negative result may occur with improper specimen collection / handling, submission of specimen other than nasopharyngeal swab, presence of viral mutation(s) within the areas targeted by this assay, and inadequate number of viral copies (<250 copies / mL). A negative result must be combined with clinical observations, patient history, and epidemiological information. Fact Sheet for Patients:   StrictlyIdeas.no Fact Sheet for Healthcare Providers: BankingDealers.co.za This test is not yet approved or cleared  by the [place] FDA and has been authorized for detection and/or diagnosis of SARS-CoV-2 by FDA under an Emergency Use Authorization (EUA).  This EUA will remain in effect (meaning this test can be used) for the duration of the COVID-19 declaration under Section  564(b)(1) of the Act, 21 U.S.C. section 360bbb-3(b)(1), unless the authorization is terminated or revoked sooner. Performed at [facility], [address], [place], [place] [address]      Scheduled Meds: . amLODipine  5 mg Oral q morning - 10a  . bisacodyl  10 mg Rectal q morning - 10a  . calcium carbonate  1 tablet Oral Daily  . isosorbide mononitrate  60 mg Oral q morning - 10a  . linaclotide  290 mcg Oral QAC breakfast  . melatonin  9 mg Oral QHS  . metoprolol tartrate  50 mg Oral BID  . pantoprazole  40 mg Oral q morning - 10a  . polyethylene glycol  17 g Oral Daily  . rosuvastatin  20 mg Oral QPM   Continuous Infusions: . sodium chloride 50 mL/hr at 03/26/20 2329  . cefTRIAXone (ROCEPHIN)  IV Stopped (03/26/20 2215)    Procedures/Studies: DG Chest 1 View  Result Date: 03/16/2020 CLINICAL DATA:  Weakness and mild shortness of breath. ACS. EXAM: CHEST  1 VIEW COMPARISON:  09/10/2016 FINDINGS: Post median sternotomy and CABG. The heart is normal in size.  Aortic atherosclerosis and tortuosity, stable from prior exam. No pulmonary edema, focal airspace disease, pleural effusion or pneumothorax. No acute osseous abnormalities are seen. IMPRESSION: 1. No acute abnormality. 2. Post CABG with normal heart size. Aortic Atherosclerosis (ICD10-I70.0). Electronically Signed   By: Keith Rake M.D.   On: 03/16/2020 15:40   CT Head Wo Contrast  Result Date: 03/25/2020 CLINICAL DATA:  86 year old male with delirium and optimal mental status. EXAM: CT HEAD WITHOUT CONTRAST TECHNIQUE: Contiguous axial images were obtained from the base of the skull through the vertex without intravenous contrast. COMPARISON:  None. FINDINGS: Brain: There is mild age-related atrophy and chronic microvascular ischemic changes. There is no acute intracranial hemorrhage. No mass effect or midline shift. No extra-axial fluid collection. Vascular: No hyperdense vessel or unexpected calcification. Skull: Normal. Negative for fracture or focal lesion. Sinuses/Orbits: No acute finding. Other: None IMPRESSION: 1. No acute intracranial pathology. 2. Mild age-related atrophy and chronic microvascular ischemic changes. Electronically Signed   By: Anner Crete M.D.   On: 03/25/2020 02:15   CT Abdomen Pelvis W Contrast  Result Date: 03/25/2020 CLINICAL DATA:  86 year old male with abdominal distension EXAM: CT ABDOMEN AND PELVIS WITH CONTRAST TECHNIQUE: Multidetector CT imaging of the abdomen and pelvis was performed using the standard protocol following bolus administration of intravenous contrast. CONTRAST:  114mL OMNIPAQUE IOHEXOL 300 MG/ML  SOLN COMPARISON:  CT abdomen pelvis dated 03/16/2020. FINDINGS: Lower chest: Partially visualized small bilateral pleural effusions, new since the prior CT. There is associated partial compressive atelectasis of the lower lobes. Pneumonia is not excluded. Clinical correlation is recommended. There is advanced 3 vessel coronary vascular calcification. No  intra-abdominal free air. Small ascites, new since the prior CT. Hepatobiliary: The liver is unremarkable. No intrahepatic biliary ductal dilatation. The gallbladder is unremarkable. Pancreas: Unremarkable. No pancreatic ductal dilatation or surrounding inflammatory changes. Spleen: Normal in size without focal abnormality. Adrenals/Urinary Tract: The adrenal glands are unremarkable. There is mild bilateral hydronephrosis, left greater right, and new since the prior CT. Small bilateral renal cysts and additional subcentimeter hypodensities which are too small to characterize. There is mild bilateral hydronephrosis. There is diffuse thickened and irregular bladder wall which may be related to chronic bladder outlet obstruction or chronic infection. Infiltrative neoplasm of the bladder wall is not excluded. There is probable degree of obstruction of the ureterovesical junctions resulting in bilateral  hydronephrosis. The urinary bladder is decompressed around a suprapubic catheter. Air within the bladder likely introduced via catheter. Stomach/Bowel: The stomach is distended. There is dilatation of multiple loops of small bowel measuring up to 3.3 cm in caliber. No discrete transition identified. Findings may represent an ileus although an early obstruction is not entirely excluded clinical correlation is recommended. There is sigmoid diverticulosis without active inflammatory changes. There is moderate stool throughout the colon. The appendix is not visualized with certainty. No inflammatory changes identified in the right lower quadrant. Vascular/Lymphatic: Advanced aortoiliac atherosclerotic disease. The IVC is unremarkable. No portal venous gas. There is no adenopathy. Reproductive: Irregular and heterogeneously enhancing mass in the region of the prostate gland measuring 4 x 5 cm in greatest axial dimension as seen previously. There is invasion of the base of the bladder. There is also invasion of the anterior  rectal wall with ulceration. This is relatively similar to prior CT. Other: Mild subcutaneous edema. Musculoskeletal: Osteopenia with degenerative changes of the spine. Similar appearance of scattered sclerotic lesions. No acute osseous pathology. IMPRESSION: 1. Interval development of mild bilateral hydronephrosis, likely due to obstruction at the level of the ureterovesical junctions secondary to mass effect caused by the pelvic mass or due to UVJ strictures secondary to tumoral invasion. 2. Probable ileus. An early small-bowel obstruction is not excluded. 3. Sigmoid diverticulosis. 4. Enlarged and irregular pelvic mass with invasion of the base of the bladder and anterior rectal wall similar to prior CT. 5. New small bilateral pleural effusions with partial compressive atelectasis of the lower lobes. 6. Aortic Atherosclerosis (ICD10-I70.0). Electronically Signed   By: Anner Crete M.D.   On: 03/25/2020 03:48   CT ABDOMEN PELVIS W CONTRAST  Result Date: 03/16/2020 CLINICAL DATA:  Acute generalized abdominal pain. Nausea. Weakness. Patient reports constipation. Active chemotherapy. History of prostate cancer. EXAM: CT ABDOMEN AND PELVIS WITH CONTRAST TECHNIQUE: Multidetector CT imaging of the abdomen and pelvis was performed using the standard protocol following bolus administration of intravenous contrast. CONTRAST:  114mL OMNIPAQUE IOHEXOL 300 MG/ML  SOLN COMPARISON:  Abdominal CT 02/22/2020 FINDINGS: Lower chest: Subsegmental linear atelectasis in the right lower lobe. Heart is normal in size. There are coronary artery calcifications. Fluid distends the distal esophagus without wall thickening. Hepatobiliary: Borderline hepatic steatosis without focal lesion. Gallstone without pericholecystic inflammation. No biliary dilatation. Pancreas: No ductal dilatation or inflammation. Spleen: Slightly heterogeneous but no evidence of focal lesion. Normal in size. Adrenals/Urinary Tract: No adrenal nodule. No  hydronephrosis. Chronic fullness of the right renal pelvis. There is symmetric renal excretion on delayed phase imaging. Cortical cyst in the right kidney, as well as bilateral low-density lesions too small to accurately characterize. Mild cortical scarring in the lower left kidney unchanged. Suprapubic tube decompresses the urinary bladder. Diffuse bladder wall thickening which is stable from prior. Stomach/Bowel: Fluid distends the distal esophagus. Distended fluid-filled stomach. Proximal small bowel are dilated and fluid-filled. Transition from dilated to nondilated small bowel in the right abdomen, series 5, image 37, series 2, image 49. The more distal small bowel is decompressed. There is no small bowel pneumatosis. Minimal mesenteric edema in the right abdomen with trace free fluid. Appendix not definitively visualized. The ascending colon is decompressed. Moderate volume of stool in the transverse, descending, and sigmoid colon. Sigmoid diverticulosis without focal diverticulitis. Possible invasion of the anterior rectal wall by prostatic mass, series 2, image 89, also seen on prior exam. Vascular/Lymphatic: Aorto bi-iliac atherosclerosis. No aortic aneurysm. The portal vein is patent. Mesenteric  vessels are patent. No abdominopelvic adenopathy. Reproductive: Irregular low-density mass within the prostate, similar to prior measuring 5.6 x 4.6 cm, series 2, image 86. This likely invades the anterior wall of the rectum. Other: Trace free fluid in the right abdomen. No other ascites. No free air. No intra-abdominal abscess. Musculoskeletal: Scattered sclerotic lesions in the pelvis are unchanged. Scattered sclerotic lesions in the spine, also unchanged. No pathologic fracture. IMPRESSION: 1. Small-bowel obstruction with transition point in the right abdomen, likely due to adhesions. 2. Moderate stool in the transverse, descending, and sigmoid colon. Distal diverticulosis without diverticulitis. 3. Irregular  low-density mass within the prostate, likely invades the anterior wall of the rectum, grossly stable over the last 3 weeks. 4. Suprapubic tube decompresses the urinary bladder. Diffuse bladder wall thickening is chronic and likely related to enlarged prostate gland. 5. Cholelithiasis without pericholecystic inflammation. 6. Sclerotic lesions in the pelvis and spine are unchanged from prior. Aortic Atherosclerosis (ICD10-I70.0). Electronically Signed   By: Keith Rake M.D.   On: 03/16/2020 17:26   DG Chest Portable 1 View  Result Date: 03/16/2020 CLINICAL DATA:  Abdominal pain, nausea, generalized weakness and constipation, history metastatic prostate cancer with ongoing oral chemotherapy EXAM: PORTABLE CHEST 1 VIEW COMPARISON:  Portable exam 2000 hours compared to 1509 hours FINDINGS: Nasogastric tube extends into stomach. Normal heart size post CABG. Mediastinal contours and pulmonary vascularity normal. Atherosclerotic calcification aorta. Lungs clear. No pleural effusion or pneumothorax. Bones demineralized. IMPRESSION: No acute abnormalities. Electronically Signed   By: Lavonia Dana M.D.   On: 03/16/2020 20:08   DG Abd 2 Views  Result Date: 03/17/2020 CLINICAL DATA:  Prostate cancer, abdominal pain, small-bowel obstruction EXAM: ABDOMEN - 2 VIEW COMPARISON:  03/16/2020 CT abdomen/pelvis FINDINGS: Enteric tube terminates in the body of the stomach. No appreciable dilated gas-filled small bowel loops. Prominent colonic stool. No evidence of pneumatosis or pneumoperitoneum. No radiopaque nephrolithiasis. Intact lower sternotomy wires. IMPRESSION: Enteric tube terminates in the body of the stomach. No appreciable dilated gas-filled small bowel loops. Prominent colonic stool. Electronically Signed   By: Ilona Sorrel M.D.   On: 03/17/2020 08:52   DG Abd Portable 1V-Small Bowel Obstruction Protocol-initial, 8 hr delay  Result Date: 03/19/2020 CLINICAL DATA:  Small-bowel obstruction EXAM: PORTABLE ABDOMEN -  1 VIEW COMPARISON:  Mar 19, 2020 FINDINGS: There are persistent dilated loops of small bowel in the mid abdomen measuring up to approximately 3.7 cm in diameter. There is a large amount of stool in the right hemicolon. There is no pneumatosis. No free air. IMPRESSION: Persistent small bowel obstruction versus ileus. Large amount of stool in the right hemicolon. Electronically Signed   By: Constance Holster M.D.   On: 03/19/2020 20:03   DG Abd Portable 1V-Small Bowel Protocol-Position Verification  Result Date: 03/19/2020 CLINICAL DATA:  Small-bowel obstruction EXAM: PORTABLE ABDOMEN - 1 VIEW COMPARISON:  Portable exam 1053 hours compared to 03/17/2020 FINDINGS: Small amount gas in colon. Few prominent loops of small bowel in the mid abdomen. No bowel wall thickening identified. Subsegmental atelectasis at lung bases. Nasogastric tube projects over stomach. Bones demineralized. IMPRESSION: Few nonspecific dilated loops of small bowel in the mid abdomen. Electronically Signed   By: Lavonia Dana M.D.   On: 03/19/2020 11:27   DG Abd Portable 2 Views  Result Date: 03/25/2020 CLINICAL DATA:  Abdominal pain for 1 week, recent bowel obstruction EXAM: PORTABLE ABDOMEN - 2 VIEW COMPARISON:  03/19/2020 FINDINGS: Supine and upright frontal views of the abdomen and pelvis demonstrate  distended gas-filled loops of small bowel measuring up to 4 cm. Overall, caliber slightly increased since prior study. Gas and stool seen throughout the colon to the rectum. No free gas in the greater peritoneal sac. Scattered gas fluid levels in the small bowel on upright projection. IMPRESSION: 1. Distended gas fluid loops of small bowel with multiple gas fluid levels, consistent with obstruction. Increased caliber of the small bowel since prior study. Electronically Signed   By: Randa Ngo M.D.   On: 03/25/2020 02:28    Orson Eva, DO  [facility]  If 7PM-7AM, please contact night-coverage [email or web address] Password  [email or web address] 03/27/2020, 10:08 AM   LOS: 2 days

## 2020-03-27 NOTE — Progress Notes (Signed)
Initial Nutrition Assessment  DOCUMENTATION CODES:   Not applicable  INTERVENTION:  -Boost Breeze po daily, each supplement provides 250 kcal and 9 grams of protein -Ensure Enlive po BID, each supplement provides 350 kcal and 20 grams of protein  NUTRITION DIAGNOSIS:   Inadequate oral intake related to cancer and cancer related treatments as evidenced by percent weight loss.  GOAL:   Patient will meet greater than or equal to 90% of their needs    MONITOR:   PO intake, Supplement acceptance, Weight trends, Labs  REASON FOR ASSESSMENT:   Consult Assessment of nutrition requirement/status  ASSESSMENT:   86 year old male with past medical history of HTN, atrial fibrillation, prostate cancer, chronic back pain, prior stroke, and recent hospitalization (5/01-5/07) for partial small bowel obstruction, discharged to SNF for rehab presented from [facility] with altered mental status and severe agitation.  Patient admitted for acute metabolic encephalopathy.  Palliative meeting with family at RD attempt to see patient. Will re-attempt as time allows today. He is on a soft diet, no documented po intakes at this time for review. Will provide Boost Breeze and Ensure supplements to aid with meeting needs.  Abdominal x-ray noted consistent with obstruction, CT abdomen pelvis revealed enlarged and irregular pelvic region at base of bladder and anterior rectal wall, likely d/t tumoral invasion. Per chart, case was discussed with urology, pt with advance and progressive disease, poor prognosis, no intervention recommended. Per TOC note, discharge planning for palliative/comfort care in hospice/SNF facility.  Current wt 154.88 lbs Weight history reviewed, on 10/06/19 pt weighed 169.62 lbs, on 11/02/19 pt weighed 169.4 lbs, on 11/23/19 pt weighed 168.52 lbs, on 12/22/19 pt weighed 169.62 lbs, on 01/22/20 he weighed 171.82 lbs, on 02/26/20 pt weighed 172.92 lbs, and on 03/17/20 he weighed 154.88 lbs.  This indicates a 14.74 lb (8.7%) wt loss in the past 6 months and 16.94 lbs (9.6%) in 2 months which is significant.  Medications reviewed and include: Dulcolax, Ca carbonate, Imdur, Linzess, Melatonin, Protonix, Miralax IVF: NaCl IVPB: Recephin Labs: K 2.8 (L), BUN 6 (L), Cr 0.43 (L)  NUTRITION - FOCUSED PHYSICAL EXAM: Deferred   Diet Order:   Diet Order            DIET SOFT Room service appropriate? Yes; Fluid consistency: Thin  Diet effective now              EDUCATION NEEDS:   No education needs have been identified at this time  Skin:  Skin Assessment: Reviewed RN Assessment  Last BM:  5/11  Height:   Ht Readings from Last 1 Encounters:  03/25/20 5\' 8"  (1.727 m)    Weight:   Wt Readings from Last 1 Encounters:  03/25/20 70.4 kg    BMI:  Body mass index is 23.6 kg/m.  Estimated Nutritional Needs:   Kcal:  [id number]  Protein:  100-113  Fluid:  >/= 1.7 L/day   Lajuan Lines, RD, LDN Clinical Nutrition After Hours/Weekend Pager # in [place]
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
## 2020-03-27 NOTE — Progress Notes (Signed)
Daily Progress Note   Patient Name: Elijah Santana       Date: 03/27/2020 DOB: Feb 10, 1932  Age: 86 y.o. MRN#: [id number] Attending Physician: Orson Eva, MD Primary Care Physician: Glenda Chroman, MD Admit Date: 03/25/2020  Reason for Consultation/Follow-up: Establishing goals of care  Subjective: Patient awake, alert, oriented. Intermittent confusion but able to participate in [place] discussion. C/o of lower back pain and requesting heating pad. Sitting up in recliner.   GOC:  Extensive time spent with patient and daughter ([name]) at bedside this morning. Patient known to this PMT provider from admission last week. Notes reviewed. Patient discharged to SNF rehab with outpatient palliative referral. Unfortunately, re-hospitalized within 3 days.   Chart reviewed in detail. Discussed events leading up to re-admission and course of hospitalization including diagnoses, interventions, plan of care. Reviewed Dr. Nolon Lennert notes and conversation with urology--with worsening cancer and recommendation for palliative/hospice focused care. Urology does not recommend invasive procedures.   Frankly and compassionately discussed poor long-term prognosis and high risk for recurrent hospitalization secondary to progressive metastatic prostate cancer and complications that have come from this including recurrent partial small bowel obstruction and now complicated UTI due to obstructive uropathy. Discussed high risk for recurrent UTI, obstructive uropathy, and/or even renal failure with progressive prostate cancer causing uropathy. [name] seems to understand high risk for recurrent hospitalization and this becoming a cycle.   Discussed outpatient palliative versus hospice options. Discussed hospice philosophy in detail.  Patient mentions that he thought hospice was already started. Explained that this was recommended but providers wanting further input and discussion from my team.   Shared my conversation with Dr. Carles Collet this morning and our thoughts that he is eligible for [facility] residential hospice facility with prognosis of possibly a few months if not less. [name] shares that urology told her 54mo-2yr. Explained that we certainly do not have a crystal ball, but that I would not be surprised if his time is months or less with recurrent hospitalization due to complications from progressive cancer. [name] tearful but understands and does not want this to be prolonged for one year. She is hopeful to talk with [name] regarding his recommendations and prognosis before making a decision on disposition plan. She does seem to be leaning towards hospice involvement but shares he cannot return home with hospice  due to lack of HS caregivers and need for higher level of care.  Although intermittently confused, patient does seem to have a good understanding that his cancer has progressed and is not curable. He knows he is dying and does not want to suffer. Again reiterated recommendation for hospice and comfort focused care, in order to ensure symptom management and relief from suffering. Also not prolonging this process but allowing quality of life and nature to take course with his cancer.   Last week, patient told this NP that he would rather go heaven, than suffer here on earth. Patient again agrees with this statement that he made last week.   Answered all questions and concerns. Emotional/spiritual support provided. Therapeutic listening as patient shares stories. PMT contact information given to [name].    Length of Stay: 2  Current Medications: Scheduled Meds:  . amLODipine  5 mg Oral q morning - 10a  . bisacodyl  10 mg Rectal q morning - 10a  . calcium carbonate  1 tablet Oral Daily  . isosorbide mononitrate   60 mg Oral q morning - 10a  . linaclotide  290 mcg Oral QAC breakfast  . melatonin  9 mg Oral QHS  . metoprolol tartrate  50 mg Oral BID  . pantoprazole  40 mg Oral q morning - 10a  . polyethylene glycol  17 g Oral Daily  . rosuvastatin  20 mg Oral QPM    Continuous Infusions: . sodium chloride 50 mL/hr at 03/26/20 2329  . cefTRIAXone (ROCEPHIN)  IV Stopped (03/26/20 2215)    PRN Meds: acetaminophen **OR** acetaminophen, HYDROcodone-acetaminophen, morphine injection, ondansetron **OR** ondansetron (ZOFRAN) IV, traMADol  Physical Exam Vitals and nursing note reviewed.  Constitutional:      General: He is awake.     Appearance: He is ill-appearing.  HENT:     Head: Normocephalic and atraumatic.  Pulmonary:     Effort: No tachypnea, accessory muscle usage or respiratory distress.  Abdominal:     Tenderness: There is no abdominal tenderness.  Skin:    General: Skin is warm and dry.  Neurological:     Mental Status: He is alert and oriented to person, place, and time.     Comments: Intermittent confusion but able to participate in discussion.   Psychiatric:        Mood and Affect: Mood normal.        Speech: Speech normal.        Behavior: Behavior normal.            Vital Signs: BP (!) 156/80 (BP Location: Right Arm)   Pulse 72   Temp 98.9 F (37.2 C) (Oral)   Resp 16   Ht 5\' 8"  (1.727 m)   Wt 70.4 kg   SpO2 94%   BMI 23.60 kg/m  SpO2: SpO2: 94 % O2 Device: O2 Device: Room Air O2 Flow Rate:    Intake/output summary:   Intake/Output Summary (Last 24 hours) at 03/27/2020 0932 Last data filed at 03/27/2020 [id number] Gross per 24 hour  Intake 3668.09 ml  Output 3600 ml  Net 68.09 ml   LBM: Last BM Date: 03/26/20 Baseline Weight: Weight: 70.4 kg Most recent weight: Weight: 70.4 kg       Palliative Assessment/Data: PPS 40%     Patient Active Problem List   Diagnosis Date Noted  . Altered mental status, unspecified 03/25/2020  . Palliative care by specialist    . Paroxysmal atrial fibrillation ([place]) 03/17/2020  . Suprapubic catheter ([place])  03/17/2020  . Small bowel obstruction due to adhesions ([place]) 03/16/2020  . Goals of care, counseling/discussion 10/25/2019  . Family history of pancreatic cancer   . Family history of brain cancer   . Rectal pain 08/08/2019  . Lower abdominal pain 08/08/2019  . Urinary retention 03/24/2019  . Bilateral hydronephrosis 03/24/2019  . Uncontrolled hypertension 03/24/2019  . Acute lower UTI 03/24/2019  . Anemia 03/24/2019  . Bone metastasis ([place]) 04/08/2018  . Prostate cancer ([place]) 11/27/2017  . Atrial fibrillation, rapid -new 09/10/16 09/10/2016  . PAF- in setting of NSTEMI 09/10/2016  . Accelerating angina ([place])   . CAD S/P percutaneous coronary angioplasty   . Coronary artery disease with hx of myocardial infarct w/o hx of CABG 03/21/2015  . Ischemic cardiomyopathy   . Hyperglycemia   . Essential hypertension   . BPH (benign prostatic hyperplasia)   . NSTEMI- Troponin peak 6/47 12/31/2014  . S/P CABG x 4 2004 12/31/2014  . GERD (gastroesophageal reflux disease) 04/29/2012  . Mixed hyperlipidemia   . Essential hypertension, benign 08/06/2009    Palliative Care Assessment & Plan   Patient Profile: 86 y.o. 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 CT abdomen/pelvis 5/1 revealed small bowel obstruction with transition point in the right abdomen likely secondary to adhesions. General surgery following. NGT removed on 5/5 and patient started on clear liquid diet. No indication for surgery. Palliative medicine consultation for goals of care.   Patient is followed by [name]. He is taking enzalutamide without side effects. Currently  receiving palliative radiation. Receives Lupron injections from [name] and suprapubic cath will be changed once a month. Previous CT from 02/22/20 shows stable prostate mass however directly invading the anterior mid to low rectal wall. Bone mets in the spine and pelvic girdle stable. No adenopathy. Last PSA 0.05.   Hospital readmission 03/25/20 with altered mental status and combativeness at [facility] rehab. Abdominal xray with distended gas-filled loops of small bowel with multiple fluid levels consistent with obstruction. CT abdomen/pelvis revealed interval devolopment of mild bilateral hydronephrosis likely due to obstruction, secondary to mass effect caused by pelvic mass or due to ureter vesical junction strictures secondary to tumoral invasion. Possible ileus. Enlarged and irregular pelvic region of the base of bladder and anterior rectal wall. [name] discussed case with [name] with urology. Due to advanced and progressive prostate cancer, patient with poor prognosis and urology recommending palliative. No invasive intervention recommended. Palliative re-consulted for goals of care/terminal care.   Assessment: Acute metabolic encephalopathy Complicate UTI Obstructive uropathy Metastatic prostate cancer Suspected recurrent partial small bowel obstruction Afib Chronic back pain  Recommendations/Plan:  DNR/DNI  Continue medical management and current plan of care.  Ongoing discussions regarding [place] and disposition plan. SNF with palliative versus strong recommendation for hospice enrollment with high risk for decline and recurrent hospitalization.   Appreciate oncology follow-up. [name] will contact daughter when available.   Continue prn symptom management medications. Lidoderm patch to lower back. K pad ordered. Patient with chronic back pain.   PMT will f/u in AM.   Code Status: DNR/DNI   Code Status Orders  (From admission, onward)         Start     Ordered    03/25/20 0527  Do not  attempt resuscitation (DNR)  Continuous    Question Answer Comment  In the event of cardiac or respiratory ARREST Do not call a "code blue"   In the event of cardiac or respiratory ARREST Do not perform Intubation, CPR, defibrillation or ACLS   In the event of cardiac or respiratory ARREST Use medication by any route, position, wound care, and other measures to relive pain and suffering. May use oxygen, suction and manual treatment of airway obstruction as needed for comfort.   Comments DNR form is present at the bedside with papers from [facility]      03/25/20 0528        Code Status History    Date Active Date Inactive Code Status Order ID Comments User Context   03/16/2020 2153 03/22/2020 2238 DNR [id number]  Oswald Hillock, MD Inpatient   03/24/2019 0340 03/25/2019 1506 Full Code [id number]  Jani Gravel, MD ED   09/10/2016 2300 09/12/2016 1456 Full Code [id number]  Fay Records, MD Inpatient   11/21/2015 0949 11/22/2015 1545 Full Code [id number]  Troy Sine, MD Inpatient   01/01/2015 1405 01/02/2015 1401 Full Code [id number]  [place], Peter M, MD Inpatient   12/31/2014 1606 01/01/2015 1405 Full Code [id number]  Crista Luria Inpatient   12/30/2014 2051 12/31/2014 1606 Full Code [id number]  Doree Albee, MD ED   04/29/2012 0255 04/29/2012 2121 Full Code [id number]  Godfrey Pick, RN Inpatient   Advance Care Planning Activity    Advance Directive Documentation     Most Recent Value  Type of Advance Directive  Out of facility DNR (pink MOST or yellow form)  Pre-existing out of facility DNR order (yellow form or pink MOST form)  Pink Most/Yellow Form available - Physician notified to receive inpatient order  "MOST" Form in Place?  -       Prognosis:  Poor prognosis and feel he is eligible for [facility] hospice facility with 6-8 weeks prognosis with progressive metastatic prostate cancer, partial small bowel obstruction, obstructive uropathy, complicated  UTI. High risk for recurrent hospitalizations without shift to comfort and hospice.  Discharge Planning:  To Be Determined  Care plan was discussed with Dr. Carles Collet, RN, patient, daughter. Discussed with [name] via secure chat  Thank you for allowing the Palliative Medicine Team to assist in the care of this patient.   Time In: 1020 Time Out: 1150 Total Time 90 Prolonged Time Billed  yes      Greater than 50%  of this time was spent counseling and coordinating care related to the above assessment and plan.  Ihor Dow, DNP, FNP-C Palliative Medicine Team  Phone: [phone] Fax: [phone]  Please contact Palliative Medicine Team phone at [phone] for questions and concerns.

## 2020-03-28 ENCOUNTER — Ambulatory Visit: Payer: Medicare HMO

## 2020-03-28 DIAGNOSIS — I2510 Atherosclerotic heart disease of native coronary artery without angina pectoris: Secondary | ICD-10-CM | POA: Diagnosis present

## 2020-03-28 DIAGNOSIS — G9341 Metabolic encephalopathy: Secondary | ICD-10-CM | POA: Diagnosis present

## 2020-03-28 DIAGNOSIS — R4182 Altered mental status, unspecified: Secondary | ICD-10-CM

## 2020-03-28 DIAGNOSIS — Z87891 Personal history of nicotine dependence: Secondary | ICD-10-CM

## 2020-03-28 DIAGNOSIS — I252 Old myocardial infarction: Secondary | ICD-10-CM

## 2020-03-28 DIAGNOSIS — K56600 Partial intestinal obstruction, unspecified as to cause: Secondary | ICD-10-CM | POA: Diagnosis present

## 2020-03-28 DIAGNOSIS — R319 Hematuria, unspecified: Secondary | ICD-10-CM | POA: Diagnosis present

## 2020-03-28 DIAGNOSIS — Z9359 Other cystostomy status: Secondary | ICD-10-CM

## 2020-03-28 DIAGNOSIS — E782 Mixed hyperlipidemia: Secondary | ICD-10-CM | POA: Diagnosis present

## 2020-03-28 DIAGNOSIS — Z7901 Long term (current) use of anticoagulants: Secondary | ICD-10-CM

## 2020-03-28 DIAGNOSIS — Z8673 Personal history of transient ischemic attack (TIA), and cerebral infarction without residual deficits: Secondary | ICD-10-CM

## 2020-03-28 DIAGNOSIS — B961 Klebsiella pneumoniae [K. pneumoniae] as the cause of diseases classified elsewhere: Secondary | ICD-10-CM | POA: Diagnosis present

## 2020-03-28 DIAGNOSIS — Z7902 Long term (current) use of antithrombotics/antiplatelets: Secondary | ICD-10-CM

## 2020-03-28 DIAGNOSIS — Z923 Personal history of irradiation: Secondary | ICD-10-CM

## 2020-03-28 DIAGNOSIS — I10 Essential (primary) hypertension: Secondary | ICD-10-CM | POA: Diagnosis present

## 2020-03-28 DIAGNOSIS — Z808 Family history of malignant neoplasm of other organs or systems: Secondary | ICD-10-CM

## 2020-03-28 DIAGNOSIS — C779 Secondary and unspecified malignant neoplasm of lymph node, unspecified: Secondary | ICD-10-CM | POA: Diagnosis present

## 2020-03-28 DIAGNOSIS — Z20822 Contact with and (suspected) exposure to covid-19: Secondary | ICD-10-CM | POA: Diagnosis present

## 2020-03-28 DIAGNOSIS — Z1611 Resistance to penicillins: Secondary | ICD-10-CM | POA: Diagnosis present

## 2020-03-28 DIAGNOSIS — H9190 Unspecified hearing loss, unspecified ear: Secondary | ICD-10-CM | POA: Diagnosis present

## 2020-03-28 DIAGNOSIS — T83518A Infection and inflammatory reaction due to other urinary catheter, initial encounter: Secondary | ICD-10-CM | POA: Diagnosis present

## 2020-03-28 DIAGNOSIS — K219 Gastro-esophageal reflux disease without esophagitis: Secondary | ICD-10-CM | POA: Diagnosis present

## 2020-03-28 DIAGNOSIS — Z66 Do not resuscitate: Secondary | ICD-10-CM | POA: Diagnosis present

## 2020-03-28 DIAGNOSIS — M1990 Unspecified osteoarthritis, unspecified site: Secondary | ICD-10-CM | POA: Diagnosis present

## 2020-03-28 DIAGNOSIS — R1084 Generalized abdominal pain: Secondary | ICD-10-CM

## 2020-03-28 DIAGNOSIS — C61 Malignant neoplasm of prostate: Secondary | ICD-10-CM | POA: Diagnosis present

## 2020-03-28 DIAGNOSIS — N136 Pyonephrosis: Secondary | ICD-10-CM | POA: Diagnosis present

## 2020-03-28 DIAGNOSIS — E119 Type 2 diabetes mellitus without complications: Secondary | ICD-10-CM | POA: Diagnosis present

## 2020-03-28 DIAGNOSIS — Z85828 Personal history of other malignant neoplasm of skin: Secondary | ICD-10-CM

## 2020-03-28 DIAGNOSIS — G8929 Other chronic pain: Secondary | ICD-10-CM | POA: Diagnosis present

## 2020-03-28 DIAGNOSIS — Z951 Presence of aortocoronary bypass graft: Secondary | ICD-10-CM

## 2020-03-28 DIAGNOSIS — Z8249 Family history of ischemic heart disease and other diseases of the circulatory system: Secondary | ICD-10-CM

## 2020-03-28 DIAGNOSIS — Z800 Family history of malignant neoplasm of digestive organs: Secondary | ICD-10-CM

## 2020-03-28 DIAGNOSIS — M549 Dorsalgia, unspecified: Secondary | ICD-10-CM | POA: Diagnosis present

## 2020-03-28 DIAGNOSIS — N3001 Acute cystitis with hematuria: Secondary | ICD-10-CM

## 2020-03-28 DIAGNOSIS — I255 Ischemic cardiomyopathy: Secondary | ICD-10-CM | POA: Diagnosis present

## 2020-03-28 DIAGNOSIS — K567 Ileus, unspecified: Secondary | ICD-10-CM | POA: Diagnosis present

## 2020-03-28 DIAGNOSIS — C7951 Secondary malignant neoplasm of bone: Secondary | ICD-10-CM | POA: Diagnosis present

## 2020-03-28 DIAGNOSIS — I480 Paroxysmal atrial fibrillation: Secondary | ICD-10-CM | POA: Diagnosis present

## 2020-03-28 DIAGNOSIS — Y846 Urinary catheterization as the cause of abnormal reaction of the patient, or of later complication, without mention of misadventure at the time of the procedure: Secondary | ICD-10-CM | POA: Diagnosis present

## 2020-03-28 DIAGNOSIS — E876 Hypokalemia: Secondary | ICD-10-CM | POA: Diagnosis present

## 2020-03-28 DIAGNOSIS — Z515 Encounter for palliative care: Principal | ICD-10-CM | POA: Diagnosis present

## 2020-03-28 LAB — CBC
HCT: 31.4 % — ABNORMAL LOW (ref 39.0–52.0)
Hemoglobin: 9.5 g/dL — ABNORMAL LOW (ref 13.0–17.0)
MCH: 25.2 pg — ABNORMAL LOW (ref 26.0–34.0)
MCHC: 30.3 g/dL (ref 30.0–36.0)
MCV: 83.3 fL (ref 80.0–100.0)
Platelets: 280 10*3/uL (ref 150–400)
RBC: 3.77 MIL/uL — ABNORMAL LOW (ref 4.22–5.81)
RDW: 16.9 % — ABNORMAL HIGH (ref 11.5–15.5)
WBC: 7 10*3/uL (ref 4.0–10.5)
nRBC: 0 % (ref 0.0–0.2)

## 2020-03-28 LAB — BASIC METABOLIC PANEL
Anion gap: 6 (ref 5–15)
BUN: 6 mg/dL — ABNORMAL LOW (ref 8–23)
CO2: 25 mmol/L (ref 22–32)
Calcium: 7.3 mg/dL — ABNORMAL LOW (ref 8.9–10.3)
Chloride: 106 mmol/L (ref 98–111)
Creatinine, Ser: 0.45 mg/dL — ABNORMAL LOW (ref 0.61–1.24)
GFR calc Af Amer: >60 mL/min (ref >60)
GFR calc non Af Amer: >60 mL/min (ref >60)
Glucose, Bld: 107 mg/dL — ABNORMAL HIGH (ref 70–99)
Potassium: 2.7 mmol/L — CL (ref 3.5–5.1)
Sodium: 137 mmol/L (ref 135–145)

## 2020-03-28 LAB — PHOSPHORUS: Phosphorus: 2.7 mg/dL (ref 2.5–4.6)

## 2020-03-28 LAB — MAGNESIUM: Magnesium: 1.9 mg/dL (ref 1.7–2.4)

## 2020-03-28 MED ORDER — POTASSIUM CHLORIDE CRYS ER 20 MEQ PO TBCR
40.0000 meq | EXTENDED_RELEASE_TABLET | Freq: Once | ORAL | Status: AC
  Administered 2020-03-28: 40 meq via ORAL
  Filled 2020-03-28: qty 2

## 2020-03-28 MED ORDER — POTASSIUM CHLORIDE 10 MEQ/100ML IV SOLN
10.0000 meq | INTRAVENOUS | Status: AC
  Administered 2020-03-28 (×2): 10 meq via INTRAVENOUS
  Filled 2020-03-28 (×2): qty 100

## 2020-03-28 MED ORDER — MAGNESIUM SULFATE 2 GM/50ML IV SOLN
2.0000 g | Freq: Once | INTRAVENOUS | Status: AC
  Administered 2020-03-28: 2 g via INTRAVENOUS
  Filled 2020-03-28: qty 50

## 2020-03-28 NOTE — Consult Note (Signed)
[facility] Consultation Oncology  Name: Elijah Santana      MRN: [id number]    Location: P950/D326-71  Date: 03/28/2020 Time:5:48 PM   REFERRING PHYSICIAN: Dr. Carles Collet  REASON FOR CONSULT: Opinion regarding palliative therapy   DIAGNOSIS: Metastatic prostate cancer  HISTORY OF PRESENT ILLNESS: Elijah Santana is a 86 year old very pleasant white male who is seen in consultation today for further management of metastatic prostate cancer.  He is currently receiving enzalutamide for his cancer.  However he developed severe rectal pain for which he started receiving palliative radiation.  He could only receive 4 out of 10 treatments.  After that he was admitted to the hospital with small bowel obstruction.  When he was discharged to [facility], he came back with altered mental status.  Urinalysis grows Klebsiella pneumonia.  CT abdomen and pelvis on 03/25/2020 showed mild bilateral hydronephrosis likely due to obstruction at the level of ureterovesical junction secondary to mass-effect from the pelvic mass.  Probable ileus.  Sigmoid diverticulosis.  Enlarged and irregular pelvic mass with invasion of the base of the bladder and anterior rectal wall similar to prior CT.  New small bilateral pleural effusions with partial compression atelectasis.  His metabolic anticoagulopathy has cleared.  He is currently receiving ceftriaxone.  Daughter [name] from [place] is at bedside.   PAST MEDICAL HISTORY:   Past Medical History:  Diagnosis Date  . Arthritis   . Asthma    Childhood  . Chronic back pain   . Coronary atherosclerosis of native coronary artery    a. CABG 2004 with LIMA-LAD, SVG-D1, SVG-OM, SVG-PDA. b. Cath ~2010 with occ of SVG-diagonal, distal LAD and OM filiing by collaterals. c. NSTEMI 12/2014 s/p DES to SVG-RCA. d. 11/2015: DES to distal graft of the SVG-distal RCA  . Essential hypertension   . Family history of brain cancer   . Family history of pancreatic cancer   . Foley catheter in place    . GERD (gastroesophageal reflux disease)   . Hard of hearing   . History of pneumonia   . Hyperglycemia   . Ischemic cardiomyopathy    a. EF 45% by cath 12/2014.  . Mixed hyperlipidemia   . NSTEMI (non-ST elevated myocardial infarction) ([place]) 01/01/15  . Paroxysmal atrial fibrillation (HCC)   . Prostate cancer [facility])    Radiation therapy 1996  . Skin cancer   . Stroke [facility])    TIA- 28 years ago     ALLERGIES: No Known Allergies    MEDICATIONS: I have reviewed the patient's current medications.     PAST SURGICAL HISTORY Past Surgical History:  Procedure Laterality Date  . CARDIAC CATHETERIZATION  01/01/2015   Procedure: CORONARY STENT INTERVENTION;  Surgeon: Peter M Martinique, MD;  Location: [facility] CATH LAB;  Service: Cardiovascular;;  SVG to PDA  . CARDIAC CATHETERIZATION N/A 11/21/2015   Procedure: Left Heart Cath and Cors/Grafts Angiography;  Surgeon: Troy Sine, MD;  Location: [place] CV LAB;  Service: Cardiovascular;  Laterality: N/A;  . CARDIAC CATHETERIZATION N/A 11/21/2015   Procedure: Coronary Stent Intervention;  Surgeon: Troy Sine, MD;  Location: [place] CV LAB;  Service: Cardiovascular;  Laterality: N/A;  . CARDIAC CATHETERIZATION N/A 09/11/2016   Procedure: Left Heart Cath and Cors/Grafts Angiography;  Surgeon: Leonie Man, MD;  Location: [place] CV LAB;  Service: Cardiovascular;  Laterality: N/A;  . CARDIAC CATHETERIZATION N/A 09/11/2016   Procedure: Coronary Stent Intervention;  Surgeon: Leonie Man, MD;  Location: [place]  CV LAB;  Service: Cardiovascular;  Laterality: N/A;  . CORONARY ARTERY BYPASS GRAFT  2004   LIMA to LAD, SVG to diagonal, SVG to circumflex, SVG to PDA  . CYSTOSCOPY WITH URETHRAL DILATATION N/A 03/28/2019   Procedure: CYSTOSCOPY WITH URETHRAL  DILATATION OF STRICTURE;  Surgeon: Irine Seal, MD;  Location: [facility] ORS;  Service: Urology;  Laterality: N/A;  . GREEN LIGHT LASER TURP (TRANSURETHRAL RESECTION OF PROSTATE N/A 06/04/2016    Procedure: GREEN LIGHT LASER TURP (TRANSURETHRAL RESECTION OF PROSTATE;  Surgeon: Irine Seal, MD;  Location: [facility] ORS;  Service: Urology;  Laterality: N/A;  . LEFT HEART CATHETERIZATION WITH CORONARY/GRAFT ANGIOGRAM N/A 01/01/2015   Procedure: LEFT HEART CATHETERIZATION WITH Beatrix Fetters;  Surgeon: Peter M Martinique, MD;  Location: [facility] CATH LAB;  Service: Cardiovascular;  Laterality: N/A;  . PERCUTANEOUS CORONARY STENT INTERVENTION (PCI-S)  11/20/2015   distal SVG  with DES       . TONSILLECTOMY      FAMILY HISTORY: Family History  Problem Relation Age of Onset  . Hypertension Father   . Coronary artery disease Father   . Pancreatic cancer Mother 3  . Brain cancer Sister 25  . Brain cancer Nephew        dx age 63, d. 61s    SOCIAL HISTORY:  reports that he quit smoking about 33 years ago. His smoking use included cigars. He started smoking about 55 years ago. He has a 13.50 pack-year smoking history. He quit smokeless tobacco use about 9 years ago.  His smokeless tobacco use included chew. He reports that he does not drink alcohol or use drugs.  PERFORMANCE STATUS: The patient's performance status is 3 - Symptomatic, >50% confined to bed  PHYSICAL EXAM: Most Recent Vital Signs: Blood pressure 135/73, pulse 72, temperature 99.2 F (37.3 C), temperature source Oral, resp. rate 16, height 5' 8"  (1.727 m), weight 155 lb 3.3 oz (70.4 kg), SpO2 94 %. BP 135/73   Pulse 72   Temp 99.2 F (37.3 C) (Oral)   Resp 16   Ht 5' 8"  (1.727 m)   Wt 155 lb 3.3 oz (70.4 kg)   SpO2 94%   BMI 23.60 kg/m  General appearance: alert, cooperative and appears stated age Head: Normocephalic, without obvious abnormality, atraumatic Lungs: Bilateral air entry. Heart: regular rate and rhythm Neurologic: Grossly normal  LABORATORY DATA:  Results for orders placed or performed during the hospital encounter of 03/25/20 (from the past 48 hour(s))  CBC with Differential/Platelet     Status:  Abnormal   Collection Time: 03/27/20  5:07 AM  Result Value Ref Range   WBC 9.5 4.0 - 10.5 K/uL   RBC 4.21 (L) 4.22 - 5.81 MIL/uL   Hemoglobin 10.5 (L) 13.0 - 17.0 g/dL   HCT 34.8 (L) 39.0 - 52.0 %   MCV 82.7 80.0 - 100.0 fL   MCH 24.9 (L) 26.0 - 34.0 pg   MCHC 30.2 30.0 - 36.0 g/dL   RDW 16.6 (H) 11.5 - 15.5 %   Platelets 297 150 - 400 K/uL   nRBC 0.0 0.0 - 0.2 %   Neutrophils Relative % 79 %   Neutro Abs 7.5 1.7 - 7.7 K/uL   Lymphocytes Relative 11 %   Lymphs Abs 1.0 0.7 - 4.0 K/uL   Monocytes Relative 6 %   Monocytes Absolute 0.6 0.1 - 1.0 K/uL   Eosinophils Relative 2 %   Eosinophils Absolute 0.2 0.0 - 0.5 K/uL   Basophils Relative 0 %  Basophils Absolute 0.0 0.0 - 0.1 K/uL   Immature Granulocytes 2 %   Abs Immature Granulocytes 0.14 (H) 0.00 - 0.07 K/uL    Comment: Performed at [facility], [address] [place], [place] [address]  Basic metabolic panel     Status: Abnormal   Collection Time: 03/27/20  5:07 AM  Result Value Ref Range   Sodium 137 135 - 145 mmol/L   Potassium 2.8 (L) 3.5 - 5.1 mmol/L   Chloride 105 98 - 111 mmol/L   CO2 24 22 - 32 mmol/L   Glucose, Bld 112 (H) 70 - 99 mg/dL    Comment: Glucose reference range applies only to samples taken after fasting for at least 8 hours.   BUN 6 (L) 8 - 23 mg/dL   Creatinine, Ser 0.43 (L) 0.61 - 1.24 mg/dL   Calcium 7.8 (L) 8.9 - 10.3 mg/dL   GFR calc non Af Amer >60 >60 mL/min   GFR calc Af Amer >60 >60 mL/min   Anion gap 8 5 - 15    Comment: Performed at [facility], [address] [place], [place] [address]  Basic metabolic panel     Status: Abnormal   Collection Time: 03/28/20  5:41 AM  Result Value Ref Range   Sodium 137 135 - 145 mmol/L   Potassium 2.7 (LL) 3.5 - 5.1 mmol/L    Comment: CRITICAL RESULT CALLED TO, READ BACK BY AND VERIFIED WITH: JONES,T AT 6:20AM ON 03/28/20 BY FESTERMAN,C    Chloride 106 98 - 111 mmol/L   CO2 25 22 - 32 mmol/L   Glucose, Bld 107 (H) 70 - 99 mg/dL    Comment:  Glucose reference range applies only to samples taken after fasting for at least 8 hours.   BUN 6 (L) 8 - 23 mg/dL   Creatinine, Ser 0.45 (L) 0.61 - 1.24 mg/dL   Calcium 7.3 (L) 8.9 - 10.3 mg/dL   GFR calc non Af Amer >60 >60 mL/min   GFR calc Af Amer >60 >60 mL/min   Anion gap 6 5 - 15    Comment: Performed at [facility], [address] [place], [place] [address]  CBC     Status: Abnormal   Collection Time: 03/28/20  5:41 AM  Result Value Ref Range   WBC 7.0 4.0 - 10.5 K/uL   RBC 3.77 (L) 4.22 - 5.81 MIL/uL   Hemoglobin 9.5 (L) 13.0 - 17.0 g/dL   HCT 31.4 (L) 39.0 - 52.0 %   MCV 83.3 80.0 - 100.0 fL   MCH 25.2 (L) 26.0 - 34.0 pg   MCHC 30.3 30.0 - 36.0 g/dL   RDW 16.9 (H) 11.5 - 15.5 %   Platelets 280 150 - 400 K/uL   nRBC 0.0 0.0 - 0.2 %    Comment: Performed at [facility], [address], [place], [place] [address]  Magnesium     Status: None   Collection Time: 03/28/20  5:41 AM  Result Value Ref Range   Magnesium 1.9 1.7 - 2.4 mg/dL    Comment: Performed at [facility], [address], [place], [place] [address]  Phosphorus     Status: None   Collection Time: 03/28/20  5:41 AM  Result Value Ref Range   Phosphorus 2.7 2.5 - 4.6 mg/dL    Comment: Performed at [facility], [address], [place], [place] [address]      RADIOGRAPHY: I have reviewed CT scan from 03/25/2020.     ASSESSMENT and  PLAN:  1.  Metastatic CRPC: -CT AP on 03/25/2020 showed interval development of mild bilateral hydronephrosis, likely due to obstruction at the level of the UVJ secondary to mass-effect caused by the pelvic mass.  Enlarged and irregular pelvic mass with invasion of the base of the bladder and antiretroviral. 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  He was initially treated with radiation in 10 fractions from 10/30/2019 through 11/13/2019. -He was considered for 10 more palliative treatments out of which he could only receive 4.  He was subsequently  hospitalized with small bowel obstruction and urinary sepsis at this time. -I had a prolonged discussion with the patient and his daughter yesterday evening.  Even though his PD-L1 TPS was 30%, it is unlikely to benefit and improve his current condition. -I have strongly recommended best supportive care in the form of hospice.  Placement in a hospice home would be the best option for him. -Enzalutamide may be discontinued at this time.  2.  UTI with Klebsiella: -Continue ceftriaxone.  3.  Partial small bowel obstruction/ileus: -IV fluids are being continued.  He is eating soft foods.  All questions were answered. The patient knows to call the clinic with any problems, questions or concerns. We can certainly see the patient much sooner if necessary.   Derek Jack

## 2020-03-28 NOTE — TOC Progression Note (Signed)
Transition of Care [facility]) - Progression Note    Patient Details  Name: Elijah Santana MRN: [id number] Date of Birth: 10/12/1932  [facility]) CM/SW Contact  Boneta Lucks, RN Phone Number: 03/28/2020, 1:04 PM  Clinical Narrative:   Patient admitted with AMS. Palliative consult with family, requesting TOC to refer to residential hospice. [facility] accepted the referral. Hospice has not beds at present time.  Patient will be GIP   Expected Discharge Plan: [place] Barriers to Discharge: Hospice Bed not available(GIP)  Expected Discharge Plan and Services Expected Discharge Plan: [place] In-house Referral: Clinical Social Work Discharge Planning Services: CM Consult   Living arrangements for the past 2 months: Single Family Home                      Readmission Risk Interventions Readmission Risk Prevention Plan 03/19/2020  Transportation Screening Complete  Medication Review Press photographer) Complete  PCP or Specialist appointment within 3-5 days of discharge Not Complete  HRI or Home Care Consult Complete  SW Recovery Care/Counseling Consult Complete  Palliative Care Screening Not Applicable  Skilled Nursing Facility Complete  Some recent data might be hidden
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
## 2020-03-28 NOTE — Progress Notes (Addendum)
Daily Progress Note   Patient Name: Elijah Santana       Date: 03/28/2020 DOB: Dec 01, 1931  Age: 86 y.o. MRN#: [id number] Attending Physician: Barton Dubois, MD Primary Care Physician: Glenda Chroman, MD Admit Date: 03/25/2020  Reason for Consultation/Follow-up: Establishing goals of care  Subjective: Patient awake, alert, oriented and able to participate in discussion. Eating bites for lunch. No current pain or discomfort. In good spirits today.   GOC:  Discussed in detail with daughter, [name] this AM via telephone. Discussed course of hospitalization including diagnoses, interventions, plan of care, risk for recurrent hospitalization/complications, poor long-term prognosis, and recommendation for hospice services. [name] is appreciative of call and understands. She agrees that pursuing [place] hospice facility placement would be best, as she does not wish to see her father suffer or be in and out of the hospital. Answered questions.   Discussed with Dr. Dyann Kief and RN. Dr. Dyann Kief agrees with [facility] residential hospice placement with poor long-term prognosis due to metastatic prostate cancer.   Visited with patient and daughter, [name] at bedside. Again reviewed diagnoses, interventions, plan of care, risk for recurrent hospitalization/complications, poor long-term prognosis, and recommendation by multiple providers for initiation of hospice services. Discussed comfort focused care plan, symptom management to ensure relief from suffering, comfort feeds, and allowing nature to take course. [name] is appreciative of input from all providers including [name] yesterday. [name] and patient agree with hospice facility placement. Answered questions. Therapeutic listening and emotional/spiritual  support provided.   Length of Stay: 3  Current Medications: Scheduled Meds:  . amLODipine  5 mg Oral q morning - 10a  . bisacodyl  10 mg Rectal q morning - 10a  . calcium carbonate  1 tablet Oral Daily  . Chlorhexidine Gluconate Cloth  6 each Topical Daily  . feeding supplement  1 Container Oral Q breakfast  . feeding supplement (ENSURE ENLIVE)  237 mL Oral BID BM  . isosorbide mononitrate  60 mg Oral q morning - 10a  . lidocaine  1 patch Transdermal Q24H  . linaclotide  290 mcg Oral QAC breakfast  . melatonin  9 mg Oral QHS  . metoprolol tartrate  50 mg Oral BID  . pantoprazole  40 mg Oral q morning - 10a  . polyethylene glycol  17 g Oral Daily  . rosuvastatin  20 mg Oral QPM    Continuous Infusions: . sodium chloride 50 mL/hr at 03/27/20 2120  . cefTRIAXone (ROCEPHIN)  IV 1 g (03/27/20 2122)    PRN Meds: acetaminophen **OR** acetaminophen, HYDROcodone-acetaminophen, morphine injection, ondansetron **OR** ondansetron (ZOFRAN) IV, traMADol  Physical Exam Vitals and nursing note reviewed.  Constitutional:      General: He is awake.     Appearance: He is ill-appearing.  HENT:     Head: Normocephalic and atraumatic.  Pulmonary:     Effort: No tachypnea, accessory muscle usage or respiratory distress.  Abdominal:     Tenderness: There is no abdominal tenderness.  Skin:    General: Skin is warm and dry.  Neurological:     Mental Status: He is alert and oriented to person, place, and time.     Comments: Intermittent confusion but able to participate in discussion.   Psychiatric:        Mood and Affect: Mood normal.        Speech: Speech normal.        Behavior: Behavior normal.            Vital Signs: BP 129/66 (BP Location: Left Arm)   Pulse 71   Temp 99.2 F (37.3 C) (Oral)   Resp 16   Ht 5\' 8"  (1.727 m)   Wt 70.4 kg   SpO2 94%   BMI 23.60 kg/m  SpO2: SpO2: 94 % O2 Device: O2 Device: Room Air O2 Flow Rate:    Intake/output summary:   Intake/Output  Summary (Last 24 hours) at 03/28/2020 0921 Last data filed at 03/28/2020 [id number] Gross per 24 hour  Intake 1552.71 ml  Output 2300 ml  Net -747.29 ml   LBM: Last BM Date: 03/27/20 Baseline Weight: Weight: 70.4 kg Most recent weight: Weight: 70.4 kg       Palliative Assessment/Data: PPS 40%     Patient Active Problem List   Diagnosis Date Noted  . Bilateral ureteral obstruction   . Weakness   . DNR (do not resuscitate)   . Altered mental status, unspecified 03/25/2020  . Palliative care by specialist   . Paroxysmal atrial fibrillation ([place]) 03/17/2020  . Suprapubic catheter ([place]) 03/17/2020  . Small bowel obstruction due to adhesions ([place]) 03/16/2020  . Goals of care, counseling/discussion 10/25/2019  . Family history of pancreatic cancer   . Family history of brain cancer   . Rectal pain 08/08/2019  . Lower abdominal pain 08/08/2019  . Urinary retention 03/24/2019  . Bilateral hydronephrosis 03/24/2019  . Uncontrolled hypertension 03/24/2019  . Urinary tract infection with hematuria 03/24/2019  . Anemia 03/24/2019  . Bone metastasis ([place]) 04/08/2018  . Prostate cancer ([place]) 11/27/2017  . Atrial fibrillation, rapid -new 09/10/16 09/10/2016  . PAF- in setting of NSTEMI 09/10/2016  . Accelerating angina ([place])   . CAD S/P percutaneous coronary angioplasty   . Coronary artery disease with hx of myocardial infarct w/o hx of CABG 03/21/2015  . Ischemic cardiomyopathy   . Hyperglycemia   . Essential hypertension   . BPH (benign prostatic hyperplasia)   . NSTEMI- Troponin peak 6/47 12/31/2014  . S/P CABG x 4 2004 12/31/2014  . GERD (gastroesophageal reflux disease) 04/29/2012  . Mixed hyperlipidemia   . Essential hypertension, benign 08/06/2009    Palliative Care Assessment & Plan   Patient Profile: 86 y.o. 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 CT abdomen/pelvis 5/1 revealed small bowel obstruction with transition point in the right abdomen likely secondary to adhesions. General surgery following. NGT removed on 5/5 and patient started on clear liquid diet. No indication for surgery. Palliative medicine consultation for goals of care.   Patient is followed by [name]. He is taking enzalutamide without side effects. Currently receiving palliative radiation. Receives Lupron injections from [name] and suprapubic cath will be changed once a month. Previous CT from 02/22/20 shows stable prostate mass however directly invading the anterior mid to low rectal wall. Bone mets in the spine and pelvic girdle stable. No adenopathy. Last PSA 0.05.   Hospital readmission 03/25/20 with altered mental status and combativeness at [facility] rehab. Abdominal xray with distended gas-filled loops of small bowel with multiple fluid levels consistent with obstruction. CT abdomen/pelvis revealed interval devolopment of mild bilateral hydronephrosis likely due to obstruction, secondary to mass effect caused by pelvic mass or due to ureter vesical junction strictures secondary to tumoral invasion. Possible ileus. Enlarged and irregular pelvic region of the base of bladder and anterior rectal wall. [name] discussed case with [name] with urology. Due to advanced and progressive prostate cancer, patient with poor prognosis and urology recommending palliative. No invasive intervention recommended. Palliative re-consulted for goals of care/terminal care.   Assessment: Acute metabolic encephalopathy Complicate UTI Obstructive uropathy Metastatic prostate cancer Suspected recurrent partial small bowel obstruction Afib Chronic back pain  Recommendations/Plan:  DNR/DNI  Appreciate input from all providers.  Consensus that patient is appropriate for [facility] residential hospice placement. Daughters and patient agree and ready for referral to be placed. Notified TOC team. Transition towards comfort focused care plan.   Continue prn symptom management medications.   Continue comfort feeds per patient/family request.  Code Status: DNR/DNI   Code Status Orders  (From admission, onward)         Start     Ordered   03/25/20 0527  Do not attempt resuscitation (DNR)  Continuous    Question Answer Comment  In the event of cardiac or respiratory ARREST Do not call a "code blue"   In the event of cardiac or respiratory ARREST Do not perform Intubation, CPR, defibrillation or ACLS   In the event of cardiac or respiratory ARREST Use medication by any route, position, wound care, and other measures to relive pain and suffering. May use oxygen, suction and manual treatment of airway obstruction as needed for comfort.   Comments DNR form is present at the bedside with papers from [facility]      03/25/20 0528        Code Status History    Date Active Date Inactive Code Status Order ID Comments User Context   03/16/2020 2153 03/22/2020 2238 DNR [id number]  Oswald Hillock, MD Inpatient   03/24/2019 0340 03/25/2019 1506 Full Code [id number]  Jani Gravel, MD ED   09/10/2016 2300 09/12/2016 1456 Full Code [id number]  Fay Records, MD Inpatient   11/21/2015 0949 11/22/2015 1545 Full Code [id number]  Troy Sine, MD Inpatient   01/01/2015 1405 01/02/2015 1401 Full Code [id number]  [place], Peter M, MD Inpatient   12/31/2014 1606 01/01/2015 1405 Full Code [id number]  Crista Luria Inpatient   12/30/2014 2051 12/31/2014 1606 Full Code [id number]  Doree Albee, MD ED   04/29/2012 0255 04/29/2012 2121 Full Code [id number]  Deeann Dowse  Grandville Silos, RN Inpatient   Advance Care Planning Activity    Advance Directive Documentation     Most Recent Value  Type of Advance Directive  Out of facility DNR (pink MOST or  yellow form)  Pre-existing out of facility DNR order (yellow form or pink MOST form)  Pink Most/Yellow Form available - Physician notified to receive inpatient order  "MOST" Form in Place?  --       Prognosis:  Poor prognosis and feel he is eligible for [facility] hospice facility with 6-8 weeks prognosis with progressive metastatic prostate cancer, partial small bowel obstruction, obstructive uropathy, complicated UTI. High risk for recurrent hospitalizations without shift to comfort and hospice.  Discharge Planning:  Hospice facility  Care plan was discussed with RN, daughters [name] and [name]), patient, Dr. Dyann Kief  Thank you for allowing the Palliative Medicine Team to assist in the care of this patient.   Time In: 0905- 1200- Time Out: [id number] Total Time 50 Prolonged Time Billed  no       Greater than 50% of this time was spent counseling and coordinating care related to the above assessment and plan.   Ihor Dow, DNP, FNP-C Palliative Medicine Team  Phone: [phone] Fax: [phone]  Please contact Palliative Medicine Team phone at [phone] for questions and concerns.

## 2020-03-28 NOTE — Progress Notes (Signed)
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 The patient was recently hospitalized from 03/16/2020 to 03/22/2020 secondary to a partial small bowel obstruction.  At the time of discharge, the patient was alert and oriented x3 although he would have episodes of sundowning during the hospitalization.  Nevertheless, the patient became increasingly confused at the skilled nursing facility.  As result he was transferred to the emergency department for further evaluation. UA shows 11-20 WBC with urine culture growing Klebsiella pneumonia.  CT of the abdomen and pelvis showed dilated multiple small bowel loops up to 3.3 cm without a transition zone.  There was a enhancing prostate mass measuring 4 x 5 cm with invasion to the base of the bladder as well as a rectal wall with ulceration.  This was similar to her prior CTs.  There is also some mild bilateral hydronephrosis and new small bilateral pleural effusions. The patient was started on IV ceftriaxone.  Palliative medicine was consulted to assist with management.  Assessment/Plan: Acute metabolic encephalopathy -due to CAUTI -he has chronic suprapubic catheter -03/25/20 CT abd--obstruction at the level of the ureter pelvic junctions, secondary to mass effect from pelvic mass. -treated with antibiotics.  UTI/CAUTI--Klebsiella -Resistant to ampicillin -continue ceftriaxone -Complete 1 more day of antibiotics for a total of 5 days. -No future treatment anticipated other than symptomatic management and full comfort care. -patient is afebrile.    Metastatic prostate cancer -Patient finished 4 days of radiation prior to last admission CTAP from 02/22/2020 which shows prostatic mass stable at 5.7 x 4.7 cm. However appears to increasingly directly invade the anterior mid to lower rectal wall. Bone metastasis in the spine and pelvic girdle are stable with no new metastasis. No adenopathy. -Enzalutamide started on 02/09/2019 -follow up Dr. Petra Kuba with him 5/6-->restart Gillermina Phy  -follow up with Dr. Irine Seal, in [place] office 04/05/20 at 2PM to determine continuation of Lupron injections -pt missed 3 appointments in Jan 2021 -[name] spoke with urology, Dr. Doyne Keel a candidate for invasive intervention -will focus on comfort care and symptomatic management only.   Partial small bowel obstruction/Ileus -Continue IV fluids -03/25/2020 CT abdomen--dilated multiple small bowel loops up to 3.3 cm without transition zone. -continue miralax and bisacodyl daily -goal is for soft stool daily due to prostate cancer pushing onto rectum -not an operative candidate -03/27/20--had multiple BM's  Essential hypertension -continue metoprolol only, mainly to assist with prevention of rebound tachycardia. -BP stable.  Coronary artery disease -No chest pain presently -continue metoprolol -will discontinue plavix and imdur  Hyperlipidemia -will discontinue statin in the setting of comfort care only.   Diabetes mellitus type 2 -Continue holding metformin during hospitalization and after discharge while pursuing comfort care only. -CBGs controlled -allow for liberal control at this point   Hypokalemia -repleted -will focus on comfort care only and no further blood work will be pursued.  Goals of Care -DNR/DNI -palliative medicine following -family and patient decided comfort care and residential hospice    Total time spent 25 minutes.  Greater than 50% spent face to face counseling and coordinating  care.  Disposition Plan: Patient From:SNF D/C Place: Residential hospice Barriers: After further discussion with family members following patient's goals of care decisions will transition to comfort care pursuit hospice placement.  Family Communication:   Daughter updated at bedside 5/13  Consultants:  Palliative medicine; urology by phone  Code Status:  DNR  DVT Prophylaxis:  SCDs   Procedures: As Listed in Progress Note Above  Antibiotics: Ceftriaxone 5/10>>>03/29/20     Subjective: No fever, no chest pain, no shortness of breath.  Good oxygen saturation on room air.  Expressed ongoing abdominal pain.  Appetite continues to be poor.  Chronically ill in appearance.   Objective: Vitals:   03/27/20 1600 03/27/20 2102 03/28/20 0534 03/28/20 1105  BP: 130/76 122/62 129/66 135/73  Pulse: 71 81 71 72  Resp: 18 18 16    Temp: 98.5 F (36.9 C) (!) 97.4 F (36.3 C) 99.2 F (37.3 C)   TempSrc: Oral Oral Oral   SpO2: 97% 94% 94%   Weight:      Height:        Intake/Output Summary (Last 24 hours) at 03/28/2020 1617 Last data filed at 03/28/2020 0644 Gross per 24 hour  Intake 934.57 ml  Output 2300 ml  Net -1365.43 ml   Weight change:  Exam: General exam: Alert, awake and cooperative with examination; afebrile and expressing ongoing abdominal discomfort.  Reports multiple bowel movements on 03/27/2020.  Poor appetite has been reported. Respiratory system: Decreased breath sounds at the bases; no using accessory muscles.Marland Kitchen Respiratory effort normal. Cardiovascular system:RRR. No murmurs, rubs, gallops.  No JVD. Gastrointestinal system: Abdomen is tender to palpation (left lower quadrant mainly), no guarding.  Positive bowel sounds. Central nervous system: Alert and oriented. No focal neurological deficits. Extremities: No cyanosis or clubbing. Skin: No rashes, no petechiae. Psychiatry: Mood & affect appropriate.    Data Reviewed: I have personally reviewed  following labs and imaging studies  Basic Metabolic Panel: Recent Labs  Lab 03/22/20 0442 03/25/20 0152 03/27/20 0507 03/28/20 0541  NA 138 134* 137 137  K 3.9 3.6 2.8* 2.7*  CL 102 100 105 106  CO2 26 25 24 25   GLUCOSE 94 101* 112* 107*  BUN 21 10 6* 6*  CREATININE 0.51* 0.42* 0.43* 0.45*  CALCIUM 7.8* 8.1* 7.8* 7.3*  MG  --   --   --  1.9  PHOS  --   --   --  2.7   Liver Function Tests: Recent Labs  Lab 03/22/20 0442 03/25/20 0152  AST 19 17  ALT 20 17  ALKPHOS 47 48  BILITOT 1.0 0.8  PROT 5.0* 5.3*  ALBUMIN 2.2* 2.4*   Recent Labs  Lab 03/25/20 0152  LIPASE 33   CBC: Recent Labs  Lab 03/25/20 0152 03/27/20 0507 03/28/20 0541  WBC 8.1 9.5 7.0  NEUTROABS 6.4 7.5  --   HGB 11.3* 10.5* 9.5*  HCT 36.6* 34.8* 31.4*  MCV 81.3 82.7 83.3  PLT 361 297 280   Urine analysis:    Component Value Date/Time   COLORURINE YELLOW 03/25/2020 0133   APPEARANCEUR CLOUDY (A) 03/25/2020 0133   LABSPEC 1.011 03/25/2020 0133   PHURINE 6.0 03/25/2020 0133   GLUCOSEU NEGATIVE 03/25/2020 0133   HGBUR SMALL (A) 03/25/2020 0133   BILIRUBINUR NEGATIVE 03/25/2020 0133   KETONESUR 5 (A) 03/25/2020 0133   PROTEINUR 30 (A) 03/25/2020 0133   NITRITE POSITIVE (A) 03/25/2020 0133   LEUKOCYTESUR LARGE (A) 03/25/2020 0133   Sepsis Labs:  Recent Results (from the past 240  hour(s))  MRSA PCR Screening     Status: Abnormal   Collection Time: 03/20/20  3:32 PM   Specimen: Nasal Mucosa; Nasopharyngeal  Result Value Ref Range Status   MRSA by PCR POSITIVE (A) NEGATIVE Final    Comment:        The GeneXpert MRSA Assay (FDA approved for NASAL specimens only), is one component of a comprehensive MRSA colonization surveillance program. It is not intended to diagnose MRSA infection nor to guide or monitor treatment for MRSA infections. RESULT CALLED TO, READ BACK BY AND VERIFIED WITH: [place] ON 03/20/20 AT 2150 BY LOY,C Performed at [facility], [address] [place],  [place] [address]   Urine culture     Status: Abnormal   Collection Time: 03/25/20  1:33 AM   Specimen: Urine, Catheterized  Result Value Ref Range Status   Specimen Description   Final    URINE, CATHETERIZED Performed at [facility], [address] [place], [place] [address]    Special Requests   Final    Normal Performed at [facility], [address] [place], [place] [address]    Culture >=100,000 COLONIES/mL KLEBSIELLA PNEUMONIAE (A)  Final   Report Status 03/27/2020 FINAL  Final   Organism ID, Bacteria KLEBSIELLA PNEUMONIAE (A)  Final      Susceptibility   Klebsiella pneumoniae - MIC*    AMPICILLIN RESISTANT Resistant     CEFAZOLIN <=4 SENSITIVE Sensitive     CEFTRIAXONE <=1 SENSITIVE Sensitive     CIPROFLOXACIN <=0.25 SENSITIVE Sensitive     GENTAMICIN <=1 SENSITIVE Sensitive     IMIPENEM <=0.25 SENSITIVE Sensitive     NITROFURANTOIN 64 INTERMEDIATE Intermediate     TRIMETH/SULFA <=20 SENSITIVE Sensitive     AMPICILLIN/SULBACTAM 4 SENSITIVE Sensitive     PIP/TAZO <=4 SENSITIVE Sensitive     * >=100,000 COLONIES/mL KLEBSIELLA PNEUMONIAE  SARS Coronavirus 2 by RT PCR (hospital order, performed in [place] hospital lab) Nasopharyngeal Nasopharyngeal Swab     Status: None   Collection Time: 03/25/20  9:27 AM   Specimen: Nasopharyngeal Swab  Result Value Ref Range Status   SARS Coronavirus 2 NEGATIVE NEGATIVE Final    Comment: (NOTE) SARS-CoV-2 target nucleic acids are NOT DETECTED. The SARS-CoV-2 RNA is generally detectable in upper and lower respiratory specimens during the acute phase of infection. The lowest concentration of SARS-CoV-2 viral copies this assay can detect is 250 copies / mL. A negative result does not preclude SARS-CoV-2 infection and should not be used as the sole basis for treatment or other patient management decisions.  A negative result may occur with improper specimen collection / handling, submission of specimen other than nasopharyngeal swab,  presence of viral mutation(s) within the areas targeted by this assay, and inadequate number of viral copies (<250 copies / mL). A negative result must be combined with clinical observations, patient history, and epidemiological information. Fact Sheet for Patients:   StrictlyIdeas.no Fact Sheet for Healthcare Providers: BankingDealers.co.za This test is not yet approved or cleared  by the [place] FDA and has been authorized for detection and/or diagnosis of SARS-CoV-2 by FDA under an Emergency Use Authorization (EUA).  This EUA will remain in effect (meaning this test can be used) for the duration of the COVID-19 declaration under Section 564(b)(1) of the Act, 21 U.S.C. section 360bbb-3(b)(1), unless the authorization is terminated or revoked sooner. Performed at [facility], [address], [place], [place] [address]      Scheduled Meds: . amLODipine  5  mg Oral q morning - 10a  . bisacodyl  10 mg Rectal q morning - 10a  . calcium carbonate  1 tablet Oral Daily  . Chlorhexidine Gluconate Cloth  6 each Topical Daily  . feeding supplement  1 Container Oral Q breakfast  . feeding supplement (ENSURE ENLIVE)  237 mL Oral BID BM  . isosorbide mononitrate  60 mg Oral q morning - 10a  . lidocaine  1 patch Transdermal Q24H  . linaclotide  290 mcg Oral QAC breakfast  . melatonin  9 mg Oral QHS  . metoprolol tartrate  50 mg Oral BID  . pantoprazole  40 mg Oral q morning - 10a  . polyethylene glycol  17 g Oral Daily  . rosuvastatin  20 mg Oral QPM   Continuous Infusions: . sodium chloride 50 mL/hr at 03/27/20 2120  . cefTRIAXone (ROCEPHIN)  IV 1 g (03/27/20 2122)    Procedures/Studies: DG Chest 1 View  Result Date: 03/16/2020 CLINICAL DATA:  Weakness and mild shortness of breath. ACS. EXAM: CHEST  1 VIEW COMPARISON:  09/10/2016 FINDINGS: Post median sternotomy and CABG. The heart is normal in size. Aortic atherosclerosis and  tortuosity, stable from prior exam. No pulmonary edema, focal airspace disease, pleural effusion or pneumothorax. No acute osseous abnormalities are seen. IMPRESSION: 1. No acute abnormality. 2. Post CABG with normal heart size. Aortic Atherosclerosis (ICD10-I70.0). Electronically Signed   By: Keith Rake M.D.   On: 03/16/2020 15:40   CT Head Wo Contrast  Result Date: 03/25/2020 CLINICAL DATA:  86 year old male with delirium and optimal mental status. EXAM: CT HEAD WITHOUT CONTRAST TECHNIQUE: Contiguous axial images were obtained from the base of the skull through the vertex without intravenous contrast. COMPARISON:  None. FINDINGS: Brain: There is mild age-related atrophy and chronic microvascular ischemic changes. There is no acute intracranial hemorrhage. No mass effect or midline shift. No extra-axial fluid collection. Vascular: No hyperdense vessel or unexpected calcification. Skull: Normal. Negative for fracture or focal lesion. Sinuses/Orbits: No acute finding. Other: None IMPRESSION: 1. No acute intracranial pathology. 2. Mild age-related atrophy and chronic microvascular ischemic changes. Electronically Signed   By: Anner Crete M.D.   On: 03/25/2020 02:15   CT Abdomen Pelvis W Contrast  Result Date: 03/25/2020 CLINICAL DATA:  86 year old male with abdominal distension EXAM: CT ABDOMEN AND PELVIS WITH CONTRAST TECHNIQUE: Multidetector CT imaging of the abdomen and pelvis was performed using the standard protocol following bolus administration of intravenous contrast. CONTRAST:  120mL OMNIPAQUE IOHEXOL 300 MG/ML  SOLN COMPARISON:  CT abdomen pelvis dated 03/16/2020. FINDINGS: Lower chest: Partially visualized small bilateral pleural effusions, new since the prior CT. There is associated partial compressive atelectasis of the lower lobes. Pneumonia is not excluded. Clinical correlation is recommended. There is advanced 3 vessel coronary vascular calcification. No intra-abdominal free air.  Small ascites, new since the prior CT. Hepatobiliary: The liver is unremarkable. No intrahepatic biliary ductal dilatation. The gallbladder is unremarkable. Pancreas: Unremarkable. No pancreatic ductal dilatation or surrounding inflammatory changes. Spleen: Normal in size without focal abnormality. Adrenals/Urinary Tract: The adrenal glands are unremarkable. There is mild bilateral hydronephrosis, left greater right, and new since the prior CT. Small bilateral renal cysts and additional subcentimeter hypodensities which are too small to characterize. There is mild bilateral hydronephrosis. There is diffuse thickened and irregular bladder wall which may be related to chronic bladder outlet obstruction or chronic infection. Infiltrative neoplasm of the bladder wall is not excluded. There is probable degree of obstruction of the ureterovesical junctions  resulting in bilateral hydronephrosis. The urinary bladder is decompressed around a suprapubic catheter. Air within the bladder likely introduced via catheter. Stomach/Bowel: The stomach is distended. There is dilatation of multiple loops of small bowel measuring up to 3.3 cm in caliber. No discrete transition identified. Findings may represent an ileus although an early obstruction is not entirely excluded clinical correlation is recommended. There is sigmoid diverticulosis without active inflammatory changes. There is moderate stool throughout the colon. The appendix is not visualized with certainty. No inflammatory changes identified in the right lower quadrant. Vascular/Lymphatic: Advanced aortoiliac atherosclerotic disease. The IVC is unremarkable. No portal venous gas. There is no adenopathy. Reproductive: Irregular and heterogeneously enhancing mass in the region of the prostate gland measuring 4 x 5 cm in greatest axial dimension as seen previously. There is invasion of the base of the bladder. There is also invasion of the anterior rectal wall with ulceration.  This is relatively similar to prior CT. Other: Mild subcutaneous edema. Musculoskeletal: Osteopenia with degenerative changes of the spine. Similar appearance of scattered sclerotic lesions. No acute osseous pathology. IMPRESSION: 1. Interval development of mild bilateral hydronephrosis, likely due to obstruction at the level of the ureterovesical junctions secondary to mass effect caused by the pelvic mass or due to UVJ strictures secondary to tumoral invasion. 2. Probable ileus. An early small-bowel obstruction is not excluded. 3. Sigmoid diverticulosis. 4. Enlarged and irregular pelvic mass with invasion of the base of the bladder and anterior rectal wall similar to prior CT. 5. New small bilateral pleural effusions with partial compressive atelectasis of the lower lobes. 6. Aortic Atherosclerosis (ICD10-I70.0). Electronically Signed   By: Anner Crete M.D.   On: 03/25/2020 03:48   CT ABDOMEN PELVIS W CONTRAST  Result Date: 03/16/2020 CLINICAL DATA:  Acute generalized abdominal pain. Nausea. Weakness. Patient reports constipation. Active chemotherapy. History of prostate cancer. EXAM: CT ABDOMEN AND PELVIS WITH CONTRAST TECHNIQUE: Multidetector CT imaging of the abdomen and pelvis was performed using the standard protocol following bolus administration of intravenous contrast. CONTRAST:  175mL OMNIPAQUE IOHEXOL 300 MG/ML  SOLN COMPARISON:  Abdominal CT 02/22/2020 FINDINGS: Lower chest: Subsegmental linear atelectasis in the right lower lobe. Heart is normal in size. There are coronary artery calcifications. Fluid distends the distal esophagus without wall thickening. Hepatobiliary: Borderline hepatic steatosis without focal lesion. Gallstone without pericholecystic inflammation. No biliary dilatation. Pancreas: No ductal dilatation or inflammation. Spleen: Slightly heterogeneous but no evidence of focal lesion. Normal in size. Adrenals/Urinary Tract: No adrenal nodule. No hydronephrosis. Chronic fullness  of the right renal pelvis. There is symmetric renal excretion on delayed phase imaging. Cortical cyst in the right kidney, as well as bilateral low-density lesions too small to accurately characterize. Mild cortical scarring in the lower left kidney unchanged. Suprapubic tube decompresses the urinary bladder. Diffuse bladder wall thickening which is stable from prior. Stomach/Bowel: Fluid distends the distal esophagus. Distended fluid-filled stomach. Proximal small bowel are dilated and fluid-filled. Transition from dilated to nondilated small bowel in the right abdomen, series 5, image 37, series 2, image 49. The more distal small bowel is decompressed. There is no small bowel pneumatosis. Minimal mesenteric edema in the right abdomen with trace free fluid. Appendix not definitively visualized. The ascending colon is decompressed. Moderate volume of stool in the transverse, descending, and sigmoid colon. Sigmoid diverticulosis without focal diverticulitis. Possible invasion of the anterior rectal wall by prostatic mass, series 2, image 89, also seen on prior exam. Vascular/Lymphatic: Aorto bi-iliac atherosclerosis. No aortic aneurysm. The portal vein  is patent. Mesenteric vessels are patent. No abdominopelvic adenopathy. Reproductive: Irregular low-density mass within the prostate, similar to prior measuring 5.6 x 4.6 cm, series 2, image 86. This likely invades the anterior wall of the rectum. Other: Trace free fluid in the right abdomen. No other ascites. No free air. No intra-abdominal abscess. Musculoskeletal: Scattered sclerotic lesions in the pelvis are unchanged. Scattered sclerotic lesions in the spine, also unchanged. No pathologic fracture. IMPRESSION: 1. Small-bowel obstruction with transition point in the right abdomen, likely due to adhesions. 2. Moderate stool in the transverse, descending, and sigmoid colon. Distal diverticulosis without diverticulitis. 3. Irregular low-density mass within the  prostate, likely invades the anterior wall of the rectum, grossly stable over the last 3 weeks. 4. Suprapubic tube decompresses the urinary bladder. Diffuse bladder wall thickening is chronic and likely related to enlarged prostate gland. 5. Cholelithiasis without pericholecystic inflammation. 6. Sclerotic lesions in the pelvis and spine are unchanged from prior. Aortic Atherosclerosis (ICD10-I70.0). Electronically Signed   By: Keith Rake M.D.   On: 03/16/2020 17:26   DG Chest Portable 1 View  Result Date: 03/16/2020 CLINICAL DATA:  Abdominal pain, nausea, generalized weakness and constipation, history metastatic prostate cancer with ongoing oral chemotherapy EXAM: PORTABLE CHEST 1 VIEW COMPARISON:  Portable exam 2000 hours compared to 1509 hours FINDINGS: Nasogastric tube extends into stomach. Normal heart size post CABG. Mediastinal contours and pulmonary vascularity normal. Atherosclerotic calcification aorta. Lungs clear. No pleural effusion or pneumothorax. Bones demineralized. IMPRESSION: No acute abnormalities. Electronically Signed   By: Lavonia Dana M.D.   On: 03/16/2020 20:08   DG Abd 2 Views  Result Date: 03/17/2020 CLINICAL DATA:  Prostate cancer, abdominal pain, small-bowel obstruction EXAM: ABDOMEN - 2 VIEW COMPARISON:  03/16/2020 CT abdomen/pelvis FINDINGS: Enteric tube terminates in the body of the stomach. No appreciable dilated gas-filled small bowel loops. Prominent colonic stool. No evidence of pneumatosis or pneumoperitoneum. No radiopaque nephrolithiasis. Intact lower sternotomy wires. IMPRESSION: Enteric tube terminates in the body of the stomach. No appreciable dilated gas-filled small bowel loops. Prominent colonic stool. Electronically Signed   By: Ilona Sorrel M.D.   On: 03/17/2020 08:52   DG Abd Portable 1V-Small Bowel Obstruction Protocol-initial, 8 hr delay  Result Date: 03/19/2020 CLINICAL DATA:  Small-bowel obstruction EXAM: PORTABLE ABDOMEN - 1 VIEW COMPARISON:  Mar 19, 2020 FINDINGS: There are persistent dilated loops of small bowel in the mid abdomen measuring up to approximately 3.7 cm in diameter. There is a large amount of stool in the right hemicolon. There is no pneumatosis. No free air. IMPRESSION: Persistent small bowel obstruction versus ileus. Large amount of stool in the right hemicolon. Electronically Signed   By: Constance Holster M.D.   On: 03/19/2020 20:03   DG Abd Portable 1V-Small Bowel Protocol-Position Verification  Result Date: 03/19/2020 CLINICAL DATA:  Small-bowel obstruction EXAM: PORTABLE ABDOMEN - 1 VIEW COMPARISON:  Portable exam 1053 hours compared to 03/17/2020 FINDINGS: Small amount gas in colon. Few prominent loops of small bowel in the mid abdomen. No bowel wall thickening identified. Subsegmental atelectasis at lung bases. Nasogastric tube projects over stomach. Bones demineralized. IMPRESSION: Few nonspecific dilated loops of small bowel in the mid abdomen. Electronically Signed   By: Lavonia Dana M.D.   On: 03/19/2020 11:27   DG Abd Portable 2 Views  Result Date: 03/25/2020 CLINICAL DATA:  Abdominal pain for 1 week, recent bowel obstruction EXAM: PORTABLE ABDOMEN - 2 VIEW COMPARISON:  03/19/2020 FINDINGS: Supine and upright frontal views of the abdomen  and pelvis demonstrate distended gas-filled loops of small bowel measuring up to 4 cm. Overall, caliber slightly increased since prior study. Gas and stool seen throughout the colon to the rectum. No free gas in the greater peritoneal sac. Scattered gas fluid levels in the small bowel on upright projection. IMPRESSION: 1. Distended gas fluid loops of small bowel with multiple gas fluid levels, consistent with obstruction. Increased caliber of the small bowel since prior study. Electronically Signed   By: Randa Ngo M.D.   On: 03/25/2020 02:28    Barton Dubois, MD  [facility]  If 7PM-7AM, please contact night-coverage [email or web address] Password [facility] 03/28/2020, 4:17 PM   LOS:  3 days

## 2020-03-28 NOTE — Progress Notes (Signed)
CRITICAL VALUE ALERT  Critical Value:  Potassium 2.7  Date & Time Notied:  03/28/20 0620  Provider Notified: Hospitalist

## 2020-03-29 ENCOUNTER — Inpatient Hospital Stay (HOSPITAL_COMMUNITY)
Admission: RE | Admit: 2020-03-29 | Discharge: 2020-04-01 | DRG: 951 | Disposition: A | Discharge status: Hospice | Source: Hospice | Attending: Internal Medicine | Admitting: Internal Medicine

## 2020-03-29 ENCOUNTER — Ambulatory Visit: Payer: Medicare HMO

## 2020-03-29 DIAGNOSIS — I10 Essential (primary) hypertension: Secondary | ICD-10-CM | POA: Diagnosis not present

## 2020-03-29 DIAGNOSIS — C61 Malignant neoplasm of prostate: Secondary | ICD-10-CM | POA: Diagnosis present

## 2020-03-29 DIAGNOSIS — G9341 Metabolic encephalopathy: Secondary | ICD-10-CM | POA: Diagnosis present

## 2020-03-29 DIAGNOSIS — Z951 Presence of aortocoronary bypass graft: Secondary | ICD-10-CM | POA: Diagnosis not present

## 2020-03-29 DIAGNOSIS — C7951 Secondary malignant neoplasm of bone: Secondary | ICD-10-CM | POA: Diagnosis present

## 2020-03-29 DIAGNOSIS — N3001 Acute cystitis with hematuria: Secondary | ICD-10-CM | POA: Diagnosis not present

## 2020-03-29 DIAGNOSIS — N1330 Unspecified hydronephrosis: Secondary | ICD-10-CM | POA: Diagnosis present

## 2020-03-29 DIAGNOSIS — Z1611 Resistance to penicillins: Secondary | ICD-10-CM | POA: Diagnosis present

## 2020-03-29 DIAGNOSIS — E119 Type 2 diabetes mellitus without complications: Secondary | ICD-10-CM | POA: Diagnosis present

## 2020-03-29 DIAGNOSIS — Z66 Do not resuscitate: Secondary | ICD-10-CM | POA: Diagnosis present

## 2020-03-29 DIAGNOSIS — N135 Crossing vessel and stricture of ureter without hydronephrosis: Secondary | ICD-10-CM | POA: Diagnosis present

## 2020-03-29 DIAGNOSIS — Z9359 Other cystostomy status: Secondary | ICD-10-CM | POA: Diagnosis not present

## 2020-03-29 DIAGNOSIS — I480 Paroxysmal atrial fibrillation: Secondary | ICD-10-CM | POA: Diagnosis present

## 2020-03-29 DIAGNOSIS — R319 Hematuria, unspecified: Secondary | ICD-10-CM | POA: Diagnosis present

## 2020-03-29 DIAGNOSIS — Z20822 Contact with and (suspected) exposure to covid-19: Secondary | ICD-10-CM | POA: Diagnosis present

## 2020-03-29 DIAGNOSIS — E876 Hypokalemia: Secondary | ICD-10-CM | POA: Diagnosis not present

## 2020-03-29 DIAGNOSIS — K219 Gastro-esophageal reflux disease without esophagitis: Secondary | ICD-10-CM | POA: Diagnosis present

## 2020-03-29 DIAGNOSIS — T83518A Infection and inflammatory reaction due to other urinary catheter, initial encounter: Secondary | ICD-10-CM | POA: Diagnosis present

## 2020-03-29 DIAGNOSIS — K567 Ileus, unspecified: Secondary | ICD-10-CM | POA: Diagnosis present

## 2020-03-29 DIAGNOSIS — I2510 Atherosclerotic heart disease of native coronary artery without angina pectoris: Secondary | ICD-10-CM | POA: Diagnosis present

## 2020-03-29 DIAGNOSIS — Z515 Encounter for palliative care: Secondary | ICD-10-CM | POA: Diagnosis not present

## 2020-03-29 DIAGNOSIS — K56600 Partial intestinal obstruction, unspecified as to cause: Secondary | ICD-10-CM | POA: Diagnosis present

## 2020-03-29 DIAGNOSIS — C779 Secondary and unspecified malignant neoplasm of lymph node, unspecified: Secondary | ICD-10-CM | POA: Diagnosis present

## 2020-03-29 DIAGNOSIS — B961 Klebsiella pneumoniae [K. pneumoniae] as the cause of diseases classified elsewhere: Secondary | ICD-10-CM | POA: Diagnosis present

## 2020-03-29 DIAGNOSIS — M549 Dorsalgia, unspecified: Secondary | ICD-10-CM | POA: Diagnosis present

## 2020-03-29 DIAGNOSIS — N136 Pyonephrosis: Secondary | ICD-10-CM | POA: Diagnosis present

## 2020-03-29 DIAGNOSIS — Z7901 Long term (current) use of anticoagulants: Secondary | ICD-10-CM | POA: Diagnosis not present

## 2020-03-29 DIAGNOSIS — I255 Ischemic cardiomyopathy: Secondary | ICD-10-CM | POA: Diagnosis present

## 2020-03-29 DIAGNOSIS — E782 Mixed hyperlipidemia: Secondary | ICD-10-CM | POA: Diagnosis present

## 2020-03-29 DIAGNOSIS — Y846 Urinary catheterization as the cause of abnormal reaction of the patient, or of later complication, without mention of misadventure at the time of the procedure: Secondary | ICD-10-CM | POA: Diagnosis present

## 2020-03-29 MED ORDER — LINACLOTIDE 145 MCG PO CAPS
290.0000 ug | ORAL_CAPSULE | Freq: Every day | ORAL | Status: DC
  Administered 2020-03-30: 290 ug via ORAL
  Filled 2020-03-29 (×3): qty 2

## 2020-03-29 MED ORDER — HYDROCODONE-ACETAMINOPHEN 5-325 MG PO TABS
1.0000 | ORAL_TABLET | Freq: Four times a day (QID) | ORAL | Status: DC | PRN
  Administered 2020-03-29 – 2020-03-30 (×2): 1 via ORAL
  Filled 2020-03-29 (×2): qty 1

## 2020-03-29 MED ORDER — GLYCOPYRROLATE 0.2 MG/ML IJ SOLN: 0.2000 mg | INTRAMUSCULAR | Status: DC | PRN

## 2020-03-29 MED ORDER — ONDANSETRON HCL 4 MG/2ML IJ SOLN: 4.0000 mg | Freq: Four times a day (QID) | INTRAMUSCULAR | Status: DC | PRN

## 2020-03-29 MED ORDER — GLYCOPYRROLATE 1 MG PO TABS: 1.0000 mg | ORAL_TABLET | ORAL | Status: DC | PRN

## 2020-03-29 MED ORDER — METOPROLOL TARTRATE 50 MG PO TABS
50.0000 mg | ORAL_TABLET | Freq: Two times a day (BID) | ORAL | Status: DC
  Administered 2020-03-29 – 2020-03-30 (×2): 50 mg via ORAL
  Filled 2020-03-29 (×2): qty 1

## 2020-03-29 MED ORDER — PANTOPRAZOLE SODIUM 40 MG PO TBEC
40.0000 mg | DELAYED_RELEASE_TABLET | Freq: Every morning | ORAL | Status: DC
  Administered 2020-03-30: 40 mg via ORAL
  Filled 2020-03-29: qty 1

## 2020-03-29 MED ORDER — ONDANSETRON 4 MG PO TBDP: 4.0000 mg | ORAL_TABLET | Freq: Four times a day (QID) | ORAL | Status: DC | PRN

## 2020-03-29 MED ORDER — BIOTENE DRY MOUTH MT LIQD: 15.0000 mL | OROMUCOSAL | Status: DC | PRN

## 2020-03-29 MED ORDER — HALOPERIDOL LACTATE 2 MG/ML PO CONC: 0.5000 mg | ORAL | Status: DC | PRN

## 2020-03-29 MED ORDER — POLYVINYL ALCOHOL 1.4 % OP SOLN: 1.0000 [drp] | Freq: Four times a day (QID) | OPHTHALMIC | Status: DC | PRN

## 2020-03-29 MED ORDER — POLYETHYLENE GLYCOL 3350 17 G PO PACK
17.0000 g | PACK | Freq: Every day | ORAL | Status: DC
  Administered 2020-03-30: 17 g via ORAL
  Filled 2020-03-29: qty 1

## 2020-03-29 MED ORDER — ACETAMINOPHEN 650 MG RE SUPP: 650.0000 mg | Freq: Four times a day (QID) | RECTAL | Status: DC | PRN

## 2020-03-29 MED ORDER — HALOPERIDOL LACTATE 5 MG/ML IJ SOLN: 0.5000 mg | INTRAMUSCULAR | Status: DC | PRN

## 2020-03-29 MED ORDER — ACETAMINOPHEN 325 MG PO TABS: 650.0000 mg | ORAL_TABLET | Freq: Four times a day (QID) | ORAL | Status: DC | PRN

## 2020-03-29 MED ORDER — HALOPERIDOL 0.5 MG PO TABS: 0.5000 mg | ORAL_TABLET | ORAL | Status: DC | PRN

## 2020-03-29 NOTE — Care Management Important Message (Signed)
Important Message  Patient Details  Name: Elijah Santana MRN: [id number] Date of Birth: [birth year]   Medicare Important Message Given:  Yes     [name] 03/29/2020, 3:38 PM
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
## 2020-03-29 NOTE — Discharge Summary (Signed)
Physician Discharge Summary  Elijah Santana [id number] DOB: [birth year] DOA: 03/25/2020  PCP: Glenda Chroman, MD  Admit date: 03/25/2020 Discharge date: 03/29/2020  Time spent: 35 minutes  Recommendations for Outpatient Follow-up:  1. Full comfort care 2. End-of-life/symptomatic management only. 3. GIP admission.   Discharge Diagnoses:  Principal Problem:   Altered mental status, unspecified Active Problems:   Essential hypertension, benign   GERD (gastroesophageal reflux disease)   Mixed hyperlipidemia   Urinary tract infection with hematuria   Bilateral ureteral obstruction   Weakness   DNR (do not resuscitate)   Discharge Condition: Continue symptomatic management and end-of-life care.  Patient will be transition to GIP while waiting on bed at residential hospice facility.  CODE STATUS: DNR/DNI  Diet recommendation: Comfort feeding.  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 The patient was recently hospitalized from 03/16/2020 to 03/22/2020 secondary to a partial small bowel obstruction.  At the time of discharge, the patient was alert and oriented x3 although he would have episodes of sundowning during the hospitalization.  Nevertheless, the patient became increasingly confused at the skilled nursing facility.  As result he was transferred to the emergency department for further evaluation. UA shows 11-20 WBC with urine culture growing Klebsiella pneumonia.  CT of the abdomen and pelvis showed dilated multiple small bowel loops up to 3.3 cm without a transition zone.  There was a enhancing prostate mass  measuring 4 x 5 cm with invasion to the base of the bladder as well as a rectal wall with ulceration.  This was similar to her prior CTs.  There is also some mild bilateral hydronephrosis and new small bilateral pleural effusions. The patient was started on IV ceftriaxone.  Palliative medicine was consulted to assist with management.  Hospital Course:  Acute metabolic encephalopathy -due to CAUTI -he has chronic suprapubic catheter -03/25/20 CT abd--obstruction at the level of the ureter pelvic junctions, secondary to mass effect from pelvic mass. -treated with antibiotics (see below).  UTI/CAUTI--Klebsiella -Resistant to ampicillin -Completed a total of 5 days of rocephin. -No future treatment anticipated other than symptomatic management and full comfort care. -patient is afebrile.   Metastatic prostate cancer -Patient finished 4 days of radiation prior to last admission CTAP from 02/22/2020 which shows prostatic mass stable at 5.7 x 4.7 cm. However appears to increasingly directly invade the anterior mid to lower rectal wall. Bone metastasis in the spine and pelvic girdle are stable with no new metastasis. No adenopathy. -Enzalutamide started on 02/09/2019 -follow up Dr. Petra Kuba with him 5/6-->restart Gillermina Phy  -follow up with Dr. Irine Seal, in [place] office 04/05/20 at 2PM to determine continuation of Lupron injections -pt missed 3 appointments in Jan 2021 -[name] spoke with urology, Dr. Doyne Keel a candidate for invasive intervention -will focus on comfort care and symptomatic management only.   Partial small bowel obstruction/Ileus -Continue IV fluids -03/25/2020 CT abdomen--dilated multiple small bowel loops up to 3.3 cm without transition zone. -continue miralax and bisacodyl daily -goal is for soft stool daily due to prostate cancer pushing onto rectum -not an operative candidate -03/27/20--had multiple BM's on that day -will continue  laxatives.  Essential hypertension -continue metoprolol only, mainly to assist with prevention of rebound tachycardia. -BP stable and controlled.   Coronary artery disease -No chest pain presently -continue metoprolol to prevent rebound tachycardia. -Plavix and Imdur has been discontinued in the setting of full comfort care and minimization of pill burden.  Hyperlipidemia -statin discontinued in the setting of comfort care only and minimize pills burden.   Diabetes mellitus type 2 -Continue holding metforminduring hospitalization and after discharge while pursuing comfort care only. -CBGs overall stable. -allow for liberal control at this point  Hypokalemia -repleted -will focus on comfort care only and no further blood work will be pursued.  Goals of Care -DNR/DNI -palliative medicine following -family and patient decided comfort care and residential hospice -will flip to GIP and focus on symptomatic management.   Procedures: See below for x-ray reports.  Consultations:  Palliative care  Oncology   hospice  Discharge Exam: Vitals:   03/29/20 0617 03/29/20 1219  BP: 128/63 132/80  Pulse: 75 76  Resp: 17   Temp: 98.5 F (36.9 C)   SpO2: 95%    General exam: Alert, awake and cooperative with examination; afebrile and expressing ongoing abdominal discomfort.  Reports multiple bowel movements on 03/27/2020.    Still having poor oral intake.  Expressed pain medication is assist in controlling his abdominal pain. Respiratory system: Decreased breath sounds at the bases; no using accessory muscles.Marland Kitchen Respiratory effort normal. Cardiovascular system:RRR. No murmurs, rubs, gallops.  No JVD. Gastrointestinal system: Abdomen is tender to palpation (left lower quadrant mainly), no guarding.  Positive bowel sounds. Central nervous system: Alert and oriented. No focal neurological deficits. Extremities: No cyanosis or clubbing. Skin: No rashes, no  petechiae. Psychiatry: Mood & affect appropriate.   Discharge Instructions    Allergies as of 03/29/2020   No Known Allergies  No Known Allergies Contact information for after-discharge care    [place] .   Service: Inpatient Hospice Contact information: [address] [place] [phone]              The results of significant diagnostics from this hospitalization (including imaging, microbiology, ancillary and laboratory) are listed below for reference.    Significant Diagnostic Studies: DG Chest 1 View  Result Date: 03/16/2020 CLINICAL DATA:  Weakness and mild shortness of breath. ACS. EXAM: CHEST  1 VIEW COMPARISON:  09/10/2016 FINDINGS: Post median sternotomy and CABG. The heart is normal in size. Aortic atherosclerosis and tortuosity, stable from prior exam. No pulmonary edema, focal airspace disease, pleural effusion or pneumothorax. No acute osseous abnormalities are seen. IMPRESSION: 1. No acute abnormality. 2. Post CABG with normal heart size. Aortic Atherosclerosis (ICD10-I70.0). Electronically Signed   By: Keith Rake M.D.   On: 03/16/2020 15:40   CT Head Wo Contrast  Result Date: 03/25/2020 CLINICAL DATA:  86 year old male with delirium and optimal mental status. EXAM: CT HEAD WITHOUT CONTRAST TECHNIQUE: Contiguous axial images were obtained from the base of the skull through the vertex without intravenous contrast. COMPARISON:  None. FINDINGS: Brain: There is mild age-related atrophy and chronic microvascular ischemic changes. There is no acute intracranial hemorrhage. No mass effect or midline shift. No extra-axial fluid collection. Vascular: No hyperdense vessel or unexpected calcification. Skull: Normal. Negative for fracture or focal lesion. Sinuses/Orbits: No acute finding. Other: None IMPRESSION: 1. No acute intracranial pathology. 2. Mild age-related atrophy and chronic microvascular ischemic  changes. Electronically Signed   By: Laren Everts.D.  On: 03/25/2020 02:15   CT Abdomen Pelvis W Contrast  Result Date: 03/25/2020 CLINICAL DATA:  86 year old male with abdominal distension EXAM: CT ABDOMEN AND PELVIS WITH CONTRAST TECHNIQUE: Multidetector CT imaging of the abdomen and pelvis was performed using the standard protocol following bolus administration of intravenous contrast. CONTRAST:  158mL OMNIPAQUE IOHEXOL 300 MG/ML  SOLN COMPARISON:  CT abdomen pelvis dated 03/16/2020. FINDINGS: Lower chest: Partially visualized small bilateral pleural effusions, new since the prior CT. There is associated partial compressive atelectasis of the lower lobes. Pneumonia is not excluded. Clinical correlation is recommended. There is advanced 3 vessel coronary vascular calcification. No intra-abdominal free air. Small ascites, new since the prior CT. Hepatobiliary: The liver is unremarkable. No intrahepatic biliary ductal dilatation. The gallbladder is unremarkable. Pancreas: Unremarkable. No pancreatic ductal dilatation or surrounding inflammatory changes. Spleen: Normal in size without focal abnormality. Adrenals/Urinary Tract: The adrenal glands are unremarkable. There is mild bilateral hydronephrosis, left greater right, and new since the prior CT. Small bilateral renal cysts and additional subcentimeter hypodensities which are too small to characterize. There is mild bilateral hydronephrosis. There is diffuse thickened and irregular bladder wall which may be related to chronic bladder outlet obstruction or chronic infection. Infiltrative neoplasm of the bladder wall is not excluded. There is probable degree of obstruction of the ureterovesical junctions resulting in bilateral hydronephrosis. The urinary bladder is decompressed around a suprapubic catheter. Air within the bladder likely introduced via catheter. Stomach/Bowel: The stomach is distended. There is dilatation of multiple loops of small bowel  measuring up to 3.3 cm in caliber. No discrete transition identified. Findings may represent an ileus although an early obstruction is not entirely excluded clinical correlation is recommended. There is sigmoid diverticulosis without active inflammatory changes. There is moderate stool throughout the colon. The appendix is not visualized with certainty. No inflammatory changes identified in the right lower quadrant. Vascular/Lymphatic: Advanced aortoiliac atherosclerotic disease. The IVC is unremarkable. No portal venous gas. There is no adenopathy. Reproductive: Irregular and heterogeneously enhancing mass in the region of the prostate gland measuring 4 x 5 cm in greatest axial dimension as seen previously. There is invasion of the base of the bladder. There is also invasion of the anterior rectal wall with ulceration. This is relatively similar to prior CT. Other: Mild subcutaneous edema. Musculoskeletal: Osteopenia with degenerative changes of the spine. Similar appearance of scattered sclerotic lesions. No acute osseous pathology. IMPRESSION: 1. Interval development of mild bilateral hydronephrosis, likely due to obstruction at the level of the ureterovesical junctions secondary to mass effect caused by the pelvic mass or due to UVJ strictures secondary to tumoral invasion. 2. Probable ileus. An early small-bowel obstruction is not excluded. 3. Sigmoid diverticulosis. 4. Enlarged and irregular pelvic mass with invasion of the base of the bladder and anterior rectal wall similar to prior CT. 5. New small bilateral pleural effusions with partial compressive atelectasis of the lower lobes. 6. Aortic Atherosclerosis (ICD10-I70.0). Electronically Signed   By: Anner Crete M.D.   On: 03/25/2020 03:48   CT ABDOMEN PELVIS W CONTRAST  Result Date: 03/16/2020 CLINICAL DATA:  Acute generalized abdominal pain. Nausea. Weakness. Patient reports constipation. Active chemotherapy. History of prostate cancer. EXAM: CT  ABDOMEN AND PELVIS WITH CONTRAST TECHNIQUE: Multidetector CT imaging of the abdomen and pelvis was performed using the standard protocol following bolus administration of intravenous contrast. CONTRAST:  128mL OMNIPAQUE IOHEXOL 300 MG/ML  SOLN COMPARISON:  Abdominal CT 02/22/2020 FINDINGS: Lower chest: Subsegmental linear atelectasis in the right lower lobe.  Heart is normal in size. There are coronary artery calcifications. Fluid distends the distal esophagus without wall thickening. Hepatobiliary: Borderline hepatic steatosis without focal lesion. Gallstone without pericholecystic inflammation. No biliary dilatation. Pancreas: No ductal dilatation or inflammation. Spleen: Slightly heterogeneous but no evidence of focal lesion. Normal in size. Adrenals/Urinary Tract: No adrenal nodule. No hydronephrosis. Chronic fullness of the right renal pelvis. There is symmetric renal excretion on delayed phase imaging. Cortical cyst in the right kidney, as well as bilateral low-density lesions too small to accurately characterize. Mild cortical scarring in the lower left kidney unchanged. Suprapubic tube decompresses the urinary bladder. Diffuse bladder wall thickening which is stable from prior. Stomach/Bowel: Fluid distends the distal esophagus. Distended fluid-filled stomach. Proximal small bowel are dilated and fluid-filled. Transition from dilated to nondilated small bowel in the right abdomen, series 5, image 37, series 2, image 49. The more distal small bowel is decompressed. There is no small bowel pneumatosis. Minimal mesenteric edema in the right abdomen with trace free fluid. Appendix not definitively visualized. The ascending colon is decompressed. Moderate volume of stool in the transverse, descending, and sigmoid colon. Sigmoid diverticulosis without focal diverticulitis. Possible invasion of the anterior rectal wall by prostatic mass, series 2, image 89, also seen on prior exam. Vascular/Lymphatic: Aorto bi-iliac  atherosclerosis. No aortic aneurysm. The portal vein is patent. Mesenteric vessels are patent. No abdominopelvic adenopathy. Reproductive: Irregular low-density mass within the prostate, similar to prior measuring 5.6 x 4.6 cm, series 2, image 86. This likely invades the anterior wall of the rectum. Other: Trace free fluid in the right abdomen. No other ascites. No free air. No intra-abdominal abscess. Musculoskeletal: Scattered sclerotic lesions in the pelvis are unchanged. Scattered sclerotic lesions in the spine, also unchanged. No pathologic fracture. IMPRESSION: 1. Small-bowel obstruction with transition point in the right abdomen, likely due to adhesions. 2. Moderate stool in the transverse, descending, and sigmoid colon. Distal diverticulosis without diverticulitis. 3. Irregular low-density mass within the prostate, likely invades the anterior wall of the rectum, grossly stable over the last 3 weeks. 4. Suprapubic tube decompresses the urinary bladder. Diffuse bladder wall thickening is chronic and likely related to enlarged prostate gland. 5. Cholelithiasis without pericholecystic inflammation. 6. Sclerotic lesions in the pelvis and spine are unchanged from prior. Aortic Atherosclerosis (ICD10-I70.0). Electronically Signed   By: Keith Rake M.D.   On: 03/16/2020 17:26   DG Chest Portable 1 View  Result Date: 03/16/2020 CLINICAL DATA:  Abdominal pain, nausea, generalized weakness and constipation, history metastatic prostate cancer with ongoing oral chemotherapy EXAM: PORTABLE CHEST 1 VIEW COMPARISON:  Portable exam 2000 hours compared to 1509 hours FINDINGS: Nasogastric tube extends into stomach. Normal heart size post CABG. Mediastinal contours and pulmonary vascularity normal. Atherosclerotic calcification aorta. Lungs clear. No pleural effusion or pneumothorax. Bones demineralized. IMPRESSION: No acute abnormalities. Electronically Signed   By: Lavonia Dana M.D.   On: 03/16/2020 20:08   DG Abd 2  Views  Result Date: 03/17/2020 CLINICAL DATA:  Prostate cancer, abdominal pain, small-bowel obstruction EXAM: ABDOMEN - 2 VIEW COMPARISON:  03/16/2020 CT abdomen/pelvis FINDINGS: Enteric tube terminates in the body of the stomach. No appreciable dilated gas-filled small bowel loops. Prominent colonic stool. No evidence of pneumatosis or pneumoperitoneum. No radiopaque nephrolithiasis. Intact lower sternotomy wires. IMPRESSION: Enteric tube terminates in the body of the stomach. No appreciable dilated gas-filled small bowel loops. Prominent colonic stool. Electronically Signed   By: Ilona Sorrel M.D.   On: 03/17/2020 08:52   DG Abd Portable  1V-Small Bowel Obstruction Protocol-initial, 8 hr delay  Result Date: 03/19/2020 CLINICAL DATA:  Small-bowel obstruction EXAM: PORTABLE ABDOMEN - 1 VIEW COMPARISON:  Mar 19, 2020 FINDINGS: There are persistent dilated loops of small bowel in the mid abdomen measuring up to approximately 3.7 cm in diameter. There is a large amount of stool in the right hemicolon. There is no pneumatosis. No free air. IMPRESSION: Persistent small bowel obstruction versus ileus. Large amount of stool in the right hemicolon. Electronically Signed   By: Constance Holster M.D.   On: 03/19/2020 20:03   DG Abd Portable 1V-Small Bowel Protocol-Position Verification  Result Date: 03/19/2020 CLINICAL DATA:  Small-bowel obstruction EXAM: PORTABLE ABDOMEN - 1 VIEW COMPARISON:  Portable exam 1053 hours compared to 03/17/2020 FINDINGS: Small amount gas in colon. Few prominent loops of small bowel in the mid abdomen. No bowel wall thickening identified. Subsegmental atelectasis at lung bases. Nasogastric tube projects over stomach. Bones demineralized. IMPRESSION: Few nonspecific dilated loops of small bowel in the mid abdomen. Electronically Signed   By: Lavonia Dana M.D.   On: 03/19/2020 11:27   DG Abd Portable 2 Views  Result Date: 03/25/2020 CLINICAL DATA:  Abdominal pain for 1 week, recent bowel  obstruction EXAM: PORTABLE ABDOMEN - 2 VIEW COMPARISON:  03/19/2020 FINDINGS: Supine and upright frontal views of the abdomen and pelvis demonstrate distended gas-filled loops of small bowel measuring up to 4 cm. Overall, caliber slightly increased since prior study. Gas and stool seen throughout the colon to the rectum. No free gas in the greater peritoneal sac. Scattered gas fluid levels in the small bowel on upright projection. IMPRESSION: 1. Distended gas fluid loops of small bowel with multiple gas fluid levels, consistent with obstruction. Increased caliber of the small bowel since prior study. Electronically Signed   By: Randa Ngo M.D.   On: 03/25/2020 02:28    Microbiology: Recent Results (from the past 240 hour(s))  MRSA PCR Screening     Status: Abnormal   Collection Time: 03/20/20  3:32 PM   Specimen: Nasal Mucosa; Nasopharyngeal  Result Value Ref Range Status   MRSA by PCR POSITIVE (A) NEGATIVE Final    Comment:        The GeneXpert MRSA Assay (FDA approved for NASAL specimens only), is one component of a comprehensive MRSA colonization surveillance program. It is not intended to diagnose MRSA infection nor to guide or monitor treatment for MRSA infections. RESULT CALLED TO, READ BACK BY AND VERIFIED WITH: THOMAS,K ON 03/20/20 AT 2150 BY LOY,C Performed at [facility], [address] [place], [place] [address]   Urine culture     Status: Abnormal   Collection Time: 03/25/20  1:33 AM   Specimen: Urine, Catheterized  Result Value Ref Range Status   Specimen Description   Final    URINE, CATHETERIZED Performed at [facility], [address] [place], [place] [address]    Special Requests   Final    Normal Performed at [facility], [address], [place], [place] [address]    Culture >=100,000 COLONIES/mL KLEBSIELLA PNEUMONIAE (A)  Final   Report Status 03/27/2020 FINAL  Final   Organism ID, Bacteria KLEBSIELLA PNEUMONIAE (A)  Final      Susceptibility    Klebsiella pneumoniae - MIC*    AMPICILLIN RESISTANT Resistant     CEFAZOLIN <=4 SENSITIVE Sensitive     CEFTRIAXONE <=1 SENSITIVE Sensitive     CIPROFLOXACIN <=0.25 SENSITIVE Sensitive     GENTAMICIN <=1 SENSITIVE Sensitive  IMIPENEM <=0.25 SENSITIVE Sensitive     NITROFURANTOIN 64 INTERMEDIATE Intermediate     TRIMETH/SULFA <=20 SENSITIVE Sensitive     AMPICILLIN/SULBACTAM 4 SENSITIVE Sensitive     PIP/TAZO <=4 SENSITIVE Sensitive     * >=100,000 COLONIES/mL KLEBSIELLA PNEUMONIAE  SARS Coronavirus 2 by RT PCR (hospital order, performed in [facility] hospital lab) Nasopharyngeal Nasopharyngeal Swab     Status: None   Collection Time: 03/25/20  9:27 AM   Specimen: Nasopharyngeal Swab  Result Value Ref Range Status   SARS Coronavirus 2 NEGATIVE NEGATIVE Final    Comment: (NOTE) SARS-CoV-2 target nucleic acids are NOT DETECTED. The SARS-CoV-2 RNA is generally detectable in upper and lower respiratory specimens during the acute phase of infection. The lowest concentration of SARS-CoV-2 viral copies this assay can detect is 250 copies / mL. A negative result does not preclude SARS-CoV-2 infection and should not be used as the sole basis for treatment or other patient management decisions.  A negative result may occur with improper specimen collection / handling, submission of specimen other than nasopharyngeal swab, presence of viral mutation(s) within the areas targeted by this assay, and inadequate number of viral copies (<250 copies / mL). A negative result must be combined with clinical observations, patient history, and epidemiological information. Fact Sheet for Patients:   StrictlyIdeas.no Fact Sheet for Healthcare Providers: BankingDealers.co.za This test is not yet approved or cleared  by the [place] FDA and has been authorized for detection and/or diagnosis of SARS-CoV-2 by FDA under an Emergency Use Authorization  (EUA).  This EUA will remain in effect (meaning this test can be used) for the duration of the COVID-19 declaration under Section 564(b)(1) of the Act, 21 U.S.C. section 360bbb-3(b)(1), unless the authorization is terminated or revoked sooner. Performed at [facility], [address] [place],  [address]      Labs: Basic Metabolic Panel: Recent Labs  Lab 03/25/20 0152 03/27/20 0507 03/28/20 0541  NA 134* 137 137  K 3.6 2.8* 2.7*  CL 100 105 106  CO2 25 24 25   GLUCOSE 101* 112* 107*  BUN 10 6* 6*  CREATININE 0.42* 0.43* 0.45*  CALCIUM 8.1* 7.8* 7.3*  MG  --   --  1.9  PHOS  --   --  2.7   Liver Function Tests: Recent Labs  Lab 03/25/20 0152  AST 17  ALT 17  ALKPHOS 48  BILITOT 0.8  PROT 5.3*  ALBUMIN 2.4*   Recent Labs  Lab 03/25/20 0152  LIPASE 33   CBC: Recent Labs  Lab 03/25/20 0152 03/27/20 0507 03/28/20 0541  WBC 8.1 9.5 7.0  NEUTROABS 6.4 7.5  --   HGB 11.3* 10.5* 9.5*  HCT 36.6* 34.8* 31.4*  MCV 81.3 82.7 83.3  PLT 361 297 280    Signed:  Barton Dubois MD.  [facility] 03/29/2020, 3:03 PM

## 2020-03-29 NOTE — H&P (Signed)
History and Physical    Elijah Santana [id number] DOB: [birth year] DOA: 03/29/2020  PCP: Glenda Chroman, MD  Patient coming from: [place]  I have personally briefly reviewed patient's old medical records in [place]  Chief Complaint: UTI with hematuria metastatic prostate cancer; end-of-life care.  HPI: Elijah Santana is a 86 y.o. 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 The patient was recently hospitalized from 03/16/2020 to 03/22/2020 secondary to a partial small bowel obstruction. At the time of discharge, the patient was alert and oriented x3 although he would have episodes of sundowning during the hospitalization. Nevertheless, the patient became increasingly confused at the skilled nursing facility. As result he was transferred to the emergency department for further evaluation. UA shows 11-20 WBC with urine culture growing Klebsiella pneumonia. CT of the abdomen and pelvis showed dilated multiple small bowel loops up to 3.3 cm without a transition zone. There was a enhancing prostate mass measuring 4 x 5 cm with invasion to the base of the bladder as well as a rectal wall with ulceration. This was similar to her prior CTs. There is also some mild bilateral hydronephrosis and new small bilateral pleural effusions. The patient was treated with IV ceftriaxone and after completing 5 days of antibiotic therapy further goals of care discussion triggers transition to full comfort care and symptomatic management only.  Patient was found to be a good candidate for hospice management and at this moment is admitted for GIP while waiting on bed availability residential hospice  place.  Review of Systems: As per HPI otherwise all other systems reviewed and are negative.   Past Medical History:  Diagnosis Date  . Arthritis   . Asthma    Childhood  . Chronic back pain   . Coronary atherosclerosis of native coronary artery    a. CABG 2004 with LIMA-LAD, SVG-D1, SVG-OM, SVG-PDA. b. Cath ~2010 with occ of SVG-diagonal, distal LAD and OM filiing by collaterals. c. NSTEMI 12/2014 s/p DES to SVG-RCA. d. 11/2015: DES to distal graft of the SVG-distal RCA  . Essential hypertension   . Family history of brain cancer   . Family history of pancreatic cancer   . Foley catheter in place   . GERD (gastroesophageal reflux disease)   . Hard of hearing   . History of pneumonia   . Hyperglycemia   . Ischemic cardiomyopathy    a. EF 45% by cath 12/2014.  . Mixed hyperlipidemia   . NSTEMI (non-ST elevated myocardial infarction) ([place]) 01/01/15  . Paroxysmal atrial fibrillation (HCC)   . Prostate cancer [facility])    Radiation therapy 1996  . Skin cancer   . Stroke [facility])    TIA- 28 years ago     Past Surgical History:  Procedure Laterality Date  . CARDIAC CATHETERIZATION  01/01/2015   Procedure: CORONARY STENT INTERVENTION;  Surgeon: Peter M Martinique, MD;  Location: [facility] CATH LAB;  Service: Cardiovascular;;  SVG to PDA  . CARDIAC CATHETERIZATION N/A 11/21/2015   Procedure: Left Heart Cath and Cors/Grafts Angiography;  Surgeon: Troy Sine, MD;  Location: [place] CV LAB;  Service: Cardiovascular;  Laterality: N/A;  . CARDIAC CATHETERIZATION N/A 11/21/2015   Procedure: Coronary Stent Intervention;  Surgeon: Marcello Moores  Floyce Stakes, MD;  Location: [place] CV LAB;  Service: Cardiovascular;  Laterality: N/A;  . CARDIAC CATHETERIZATION N/A 09/11/2016   Procedure: Left Heart Cath and Cors/Grafts Angiography;  Surgeon: Leonie Man, MD;  Location: [place] CV LAB;  Service: Cardiovascular;  Laterality: N/A;  . CARDIAC CATHETERIZATION N/A 09/11/2016   Procedure: Coronary Stent  Intervention;  Surgeon: Leonie Man, MD;  Location: [place] CV LAB;  Service: Cardiovascular;  Laterality: N/A;  . CORONARY ARTERY BYPASS GRAFT  2004   LIMA to LAD, SVG to diagonal, SVG to circumflex, SVG to PDA  . CYSTOSCOPY WITH URETHRAL DILATATION N/A 03/28/2019   Procedure: CYSTOSCOPY WITH URETHRAL  DILATATION OF STRICTURE;  Surgeon: Irine Seal, MD;  Location: [facility] ORS;  Service: Urology;  Laterality: N/A;  . GREEN LIGHT LASER TURP (TRANSURETHRAL RESECTION OF PROSTATE N/A 06/04/2016   Procedure: GREEN LIGHT LASER TURP (TRANSURETHRAL RESECTION OF PROSTATE;  Surgeon: Irine Seal, MD;  Location: [facility] ORS;  Service: Urology;  Laterality: N/A;  . LEFT HEART CATHETERIZATION WITH CORONARY/GRAFT ANGIOGRAM N/A 01/01/2015   Procedure: LEFT HEART CATHETERIZATION WITH Beatrix Fetters;  Surgeon: Peter M Martinique, MD;  Location: [facility] CATH LAB;  Service: Cardiovascular;  Laterality: N/A;  . PERCUTANEOUS CORONARY STENT INTERVENTION (PCI-S)  11/20/2015   distal SVG  with DES       . TONSILLECTOMY      Social History  reports that he quit smoking about 33 years ago. His smoking use included cigars. He started smoking about 55 years ago. He has a 13.50 pack-year smoking history. He quit smokeless tobacco use about 9 years ago.  His smokeless tobacco use included chew. He reports that he does not drink alcohol or use drugs.  No Known Allergies  Family History  Problem Relation Age of Onset  . Hypertension Father   . Coronary artery disease Father   . Pancreatic cancer Mother 12  . Brain cancer Sister 10  . Brain cancer Nephew        dx age 37, d. 44s    Prior to Admission medications   Medication Sig Start Date End Date Taking? Authorizing Provider  amLODipine (NORVASC) 5 MG tablet TAKE ONE TABLET BY MOUTH DAILY (MORNING) Patient taking differently: Take 5 mg by mouth every morning.  11/22/17   Satira Sark, MD  bisacodyl (DULCOLAX) 10 MG suppository Place 1 suppository (10 mg total)  rectally every morning. 03/23/20   Orson Eva, MD  calcium carbonate (OS-CAL - DOSED IN MG OF ELEMENTAL CALCIUM) 1250 (500 Ca) MG tablet Take 1 tablet by mouth daily.     [provider]  clopidogrel (PLAVIX) 75 MG tablet TAKE ONE TABLET BY MOUTH EVERY DAY (,EVENING) Patient taking differently: Take 75 mg by mouth daily.  02/05/20   Satira Sark, MD  denosumab (XGEVA) 120 MG/1.7ML SOLN injection Inject 120 mg into the skin once.     [provider]  ELIQUIS 5 MG TABS tablet TAKE ONE TABLET BY MOUTH TWICE DAILY. (MORNING ,EVENING) Patient taking differently: Take 5 mg by mouth 2 (two) times daily.  12/11/19   Satira Sark, MD  HYDROcodone-acetaminophen (NORCO) 5-325 MG tablet Take 1 tablet by mouth at bedtime as needed for moderate pain. 03/22/20   Orson Eva, MD  isosorbide mononitrate (IMDUR) 60 MG 24 hr tablet TAKE ONE TABLET BY MOUTH DAILY (MORNING) Patient taking differently: Take 60 mg by mouth every morning.  06/23/19   Satira Sark, MD  Leuprolide Acetate (LUPRON IJ) Inject  as directed every 4 (four) months.     [provider]  linaclotide Rolan Lipa) 290 MCG CAPS capsule Take 1 capsule (290 mcg total) by mouth daily before breakfast. 11/15/19   Laurine Blazer A, PA-C  metoprolol tartrate (LOPRESSOR) 50 MG tablet Take 1 tablet (50 mg total) by mouth 2 (two) times daily. 06/23/19   Satira Sark, MD  pantoprazole (PROTONIX) 40 MG tablet Take 40 mg by mouth every morning. 09/19/19   [provider]  polyethylene glycol (MIRALAX / GLYCOLAX) 17 g packet Take 17 g by mouth daily. 03/22/20   Orson Eva, MD  rosuvastatin (CRESTOR) 20 MG tablet Take 1 tablet (20 mg total) by mouth every evening. 06/23/19   Satira Sark, MD  TOVIAZ 4 MG TB24 tablet Take 4 mg by mouth daily. 10/02/19   [provider]  traMADol (ULTRAM) 50 MG tablet Take 2 tablets (100 mg total) by mouth every 6 (six) hours as needed (for pain). 03/22/20   Orson Eva, MD  XTANDI  40 MG capsule TAKE 4 CAPSULES (160 MG TOTAL) BY MOUTH DAILY. Patient taking differently: Take 160 mg by mouth daily.  12/05/19   Derek Jack, MD    Physical Exam: Vitals:   03/29/20 1500  BP: 122/68  Pulse: 70  Resp: 19  Temp: 97.8 F (36.6 C)  TempSrc: Oral  SpO2: 98%    Constitutional: NAD, calm, comfortable. Vitals:   03/29/20 1500  BP: 122/68  Pulse: 70  Resp: 19  Temp: 97.8 F (36.6 C)  TempSrc: Oral  SpO2: 98%   Eyes: PERRL, lids and conjunctivae normal ENMT: Mucous membranes are moist. Posterior pharynx clear of any exudate or lesions.Normal dentition.  Neck: normal, supple, no masses, no thyromegaly Respiratory: clear to auscultation bilaterally, no wheezing, no crackles. Normal respiratory effort. No accessory muscle use.  Cardiovascular: Rate controlled, no rubs or gallops. No extremity edema. No carotid bruits.  Abdomen: mild lower abd tenderness on palpation. no masses palpated. No hepatosplenomegaly. Bowel sounds positive.  Musculoskeletal: no clubbing / cyanosis. No joint deformity upper and lower extremities. Good ROM, no contractures. Normal muscle tone.  Skin: no rashes, lesions, ulcers. No induration Neurologic: CN 2-12 grossly intact. Sensation intact, DTR normal. Strength 5/5 in all 4.  Psychiatric: Normal judgment and insight. Alert and oriented x 3. Normal mood.    Labs on Admission: I have personally reviewed following labs and imaging studies  CBC: Recent Labs  Lab 03/25/20 0152 03/27/20 0507 03/28/20 0541  WBC 8.1 9.5 7.0  NEUTROABS 6.4 7.5  --   HGB 11.3* 10.5* 9.5*  HCT 36.6* 34.8* 31.4*  MCV 81.3 82.7 83.3  PLT 361 297 [id number]    Basic Metabolic Panel: Recent Labs  Lab 03/25/20 0152 03/27/20 0507 03/28/20 0541  NA 134* 137 137  K 3.6 2.8* 2.7*  CL 100 105 106  CO2 25 24 25   GLUCOSE 101* 112* 107*  BUN 10 6* 6*  CREATININE 0.42* 0.43* 0.45*  CALCIUM 8.1* 7.8* 7.3*  MG  --   --  1.9  PHOS  --   --  2.7     GFR: Estimated Creatinine Clearance: 65.3 mL/min (A) (by C-G formula based on SCr of 0.45 mg/dL (L)).  Liver Function Tests: Recent Labs  Lab 03/25/20 0152  AST 17  ALT 17  ALKPHOS 48  BILITOT 0.8  PROT 5.3*  ALBUMIN 2.4*    Urine analysis:    Component Value Date/Time   COLORURINE YELLOW 03/25/2020 0133  APPEARANCEUR CLOUDY (A) 03/25/2020 0133   LABSPEC 1.011 03/25/2020 0133   PHURINE 6.0 03/25/2020 0133   GLUCOSEU NEGATIVE 03/25/2020 0133   HGBUR SMALL (A) 03/25/2020 0133   BILIRUBINUR NEGATIVE 03/25/2020 0133   KETONESUR 5 (A) 03/25/2020 0133   PROTEINUR 30 (A) 03/25/2020 0133   NITRITE POSITIVE (A) 03/25/2020 0133   LEUKOCYTESUR LARGE (A) 03/25/2020 0133    Radiological Exams on Admission: No results found.  Assessment/Plan Acute metabolic encephalopathy -due to CAUTI -he haschronicsuprapubic catheter -03/25/20 CT abd--obstruction at the level of the ureter pelvic junctions, secondary to mass effect from pelvic mass. -treated with antibiotics (see below).  UTI/CAUTI--Klebsiella -Resistant to ampicillin -Completed a total of 5 days of rocephin. -No future treatment anticipated other than symptomatic managementand full comfort care. -patient is afebrile.  Metastatic prostate cancer -Patient finished 4 days of radiation prior to last admission CTAP from 02/22/2020 which shows prostatic mass stable at 5.7 x 4.7 cm. However appears to increasingly directly invade the anterior mid to lower rectal wall. Bone metastasis in the spine and pelvic girdle are stable with no new metastasis. No adenopathy. -Enzalutamide started on 02/09/2019; now discontinued. -follow up Dr. Petra Kuba with him 5/6-->restart Gillermina Phy  -initially looking to follow up with Dr. Irine Seal, in [place] office 04/05/20 at 2PM to determine continuation of Lupron injections; at this point plan is for comfort care only. -per Dr. Tresa Moore (urology service)-->not a candidate for  invasive intervention -will focus on comfort care and symptomatic management only.  Partial small bowel obstruction/Ileus -Continue IV fluids -03/25/2020 CT abdomen--dilated multiple small bowel loops up to 3.3 cm without transition zone. -continuemiralax and bisacodyl daily -goal is for soft stool daily due to prostate cancer pushing onto rectum -not an operative candidate -03/27/20--hadmultipleBM's on that day -will continue laxatives.  Essential hypertension -continue metoprolol only, mainly to assist with prevention of rebound tachycardia. -BP stable and controlled.   Coronary artery disease -No chest pain presently -continue metoprolol to prevent rebound tachycardia. -Plavix and Imdur has been discontinued in the setting of full comfort care and minimization of pill burden.  Hyperlipidemia -statin discontinued in the setting of comfort care only and minimize pills burden.  Diabetes mellitus type 2 -Continue holding metforminduring hospitalizationand after discharge while pursuing comfort care only. -CBGs overall stable. -allow for liberal control at this point  Hypokalemia -repleted -will focus oncomfort care only and no further blood work will be pursued.  Goals of Care -DNR/DNI -palliative medicine following -familyand patient decided comfort care andresidential hospice -will flip to GIP and focus on symptomatic management.   DVT prophylaxis: None; comfort care only. Code Status:   DNR/DNI Family Communication:  Daughter at bedside. Disposition Plan:   Patient is from:  SNF.  Anticipated DC to:  Residential hospice.  Anticipated DC date:  When bed available at residential hospice.  Anticipated DC barriers: No beds available currently  Consults called:  Hospice. Admission status:  Med-surg; GIP, comfort measures and end of life care.  Severity of Illness: GIP    Barton Dubois MD [facility]  How to contact the [facility] Attending or  Consulting provider [place] or covering provider during after hours [place], for this patient?   1. Check the care team in [facility] and look for a) attending/consulting TRH provider listed and b) the [facility] team listed 2. Log into [email or web address] and use [place]'s universal password to access. If you do not have the password, please contact the hospital operator. 3. Locate the [facility] provider you  are looking for under [facility] and page to a number that you can be directly reached. 4. If you still have difficulty reaching the provider, please page the [facility] (Director on Call) for the Hospitalists listed on amion for assistance.  03/29/2020, 4:49 PM

## 2020-03-29 NOTE — TOC Progression Note (Signed)
Transition of Care [facility]) - Progression Note    Patient Details  Name: ALONTE HOLDAWAY MRN: [id number] Date of Birth: January 09, 1932  Transition of [facility]) CM/SW Contact  Shade Flood, LCSW Phone Number: 03/29/2020, 2:33 PM  Clinical Narrative:     TOC following. Spoke with Jarrett Soho at [facility] who states that pt is admitted to Hospice care and should be GIP status. Updated MD and requested he initiate the process to flip the chart. Per Jarrett Soho, the next available bed should go to this patient.  TOC will follow.  Expected Discharge Plan: [place] Barriers to Discharge: Hospice Bed not available(GIP)  Expected Discharge Plan and Services Expected Discharge Plan: [place] In-house Referral: Clinical Social Work Discharge Planning Services: CM Consult   Living arrangements for the past 2 months: [place] Determinants of Health (SDOH) Interventions    Readmission Risk Interventions Readmission Risk Prevention Plan 03/19/2020  Transportation Screening Complete  Medication Review Press photographer) Complete  PCP or Specialist appointment within 3-5 days of discharge Not Complete  HRI or Home Care Consult Complete  SW Recovery Care/Counseling Consult Complete  Palliative Care Screening Not Applicable  Skilled Nursing Facility Complete  Some recent data might be hidden

## 2020-03-29 NOTE — Discharge Instructions (Signed)
No future hospitalizations. Full comfort care and symptomatic management only.

## 2020-03-30 ENCOUNTER — Other Ambulatory Visit: Payer: Self-pay

## 2020-03-30 ENCOUNTER — Encounter (HOSPITAL_COMMUNITY): Payer: Self-pay | Admitting: Internal Medicine

## 2020-03-30 DIAGNOSIS — E876 Hypokalemia: Secondary | ICD-10-CM

## 2020-03-30 DIAGNOSIS — N1330 Unspecified hydronephrosis: Secondary | ICD-10-CM

## 2020-03-30 MED ORDER — TRAMADOL HCL 50 MG PO TABS: 100.0000 mg | ORAL_TABLET | Freq: Four times a day (QID) | ORAL | Status: DC | PRN

## 2020-03-30 MED ORDER — BOOST / RESOURCE BREEZE PO LIQD CUSTOM
1.0000 | Freq: Every day | ORAL | Status: DC
  Administered 2020-03-31 – 2020-04-01 (×2): 1 via ORAL

## 2020-03-30 MED ORDER — ACETAMINOPHEN 325 MG PO TABS
650.0000 mg | ORAL_TABLET | Freq: Four times a day (QID) | ORAL | Status: DC | PRN
Start: 2020-03-30 — End: 2020-03-30

## 2020-03-30 MED ORDER — HALOPERIDOL LACTATE 2 MG/ML PO CONC: 0.5000 mg | ORAL | Status: DC | PRN

## 2020-03-30 MED ORDER — ENSURE ENLIVE PO LIQD
237.0000 mL | Freq: Two times a day (BID) | ORAL | Status: DC
  Administered 2020-03-31 (×2): 237 mL via ORAL

## 2020-03-30 MED ORDER — HYDROCODONE-ACETAMINOPHEN 5-325 MG PO TABS
1.0000 | ORAL_TABLET | Freq: Every evening | ORAL | Status: DC | PRN
  Administered 2020-03-30: 1 via ORAL
  Filled 2020-03-30: qty 1

## 2020-03-30 MED ORDER — POLYVINYL ALCOHOL 1.4 % OP SOLN: 1.0000 [drp] | Freq: Four times a day (QID) | OPHTHALMIC | Status: DC | PRN

## 2020-03-30 MED ORDER — LIDOCAINE 5 % EX PTCH
1.0000 | MEDICATED_PATCH | CUTANEOUS | Status: DC
  Administered 2020-03-31 – 2020-04-01 (×2): 1 via TRANSDERMAL
  Filled 2020-03-30 (×2): qty 1

## 2020-03-30 MED ORDER — ACETAMINOPHEN 650 MG RE SUPP: 650.0000 mg | Freq: Four times a day (QID) | RECTAL | Status: DC | PRN

## 2020-03-30 MED ORDER — GLYCOPYRROLATE 0.2 MG/ML IJ SOLN: 0.2000 mg | INTRAMUSCULAR | Status: DC | PRN

## 2020-03-30 MED ORDER — PANTOPRAZOLE SODIUM 40 MG PO TBEC
40.0000 mg | DELAYED_RELEASE_TABLET | Freq: Every morning | ORAL | Status: DC
  Administered 2020-03-31 – 2020-04-01 (×2): 40 mg via ORAL
  Filled 2020-03-30 (×2): qty 1

## 2020-03-30 MED ORDER — ACETAMINOPHEN 325 MG PO TABS
650.0000 mg | ORAL_TABLET | Freq: Three times a day (TID) | ORAL | Status: DC
  Administered 2020-03-30 – 2020-04-01 (×7): 650 mg via ORAL
  Filled 2020-03-30 (×7): qty 2

## 2020-03-30 MED ORDER — ONDANSETRON HCL 4 MG/2ML IJ SOLN: 4.0000 mg | Freq: Four times a day (QID) | INTRAMUSCULAR | Status: DC | PRN

## 2020-03-30 MED ORDER — POLYETHYLENE GLYCOL 3350 17 G PO PACK
17.0000 g | PACK | Freq: Every day | ORAL | Status: DC
  Administered 2020-03-31 – 2020-04-01 (×2): 17 g via ORAL
  Filled 2020-03-30 (×2): qty 1

## 2020-03-30 MED ORDER — GLYCOPYRROLATE 1 MG PO TABS: 1.0000 mg | ORAL_TABLET | ORAL | Status: DC | PRN

## 2020-03-30 MED ORDER — SODIUM CHLORIDE 0.9 % IV SOLN: 250.0000 mL | INTRAVENOUS | Status: DC | PRN

## 2020-03-30 MED ORDER — SODIUM CHLORIDE 0.9 % IV SOLN
INTRAVENOUS | Status: DC
Start: 2020-03-30 — End: 2020-03-30

## 2020-03-30 MED ORDER — CHLORHEXIDINE GLUCONATE CLOTH 2 % EX PADS
6.0000 | MEDICATED_PAD | Freq: Every day | CUTANEOUS | Status: DC
  Administered 2020-03-30 – 2020-04-01 (×3): 6 via TOPICAL

## 2020-03-30 MED ORDER — BIOTENE DRY MOUTH MT LIQD: 15.0000 mL | OROMUCOSAL | Status: DC | PRN

## 2020-03-30 MED ORDER — SODIUM CHLORIDE 0.9% FLUSH: 3.0000 mL | Freq: Two times a day (BID) | INTRAVENOUS | Status: DC

## 2020-03-30 MED ORDER — SODIUM CHLORIDE 0.9% FLUSH: 3.0000 mL | INTRAVENOUS | Status: DC | PRN

## 2020-03-30 MED ORDER — LINACLOTIDE 145 MCG PO CAPS
290.0000 ug | ORAL_CAPSULE | Freq: Every day | ORAL | Status: DC
  Administered 2020-03-31 – 2020-04-01 (×2): 290 ug via ORAL
  Filled 2020-03-30 (×2): qty 2

## 2020-03-30 MED ORDER — METOPROLOL TARTRATE 50 MG PO TABS
50.0000 mg | ORAL_TABLET | Freq: Two times a day (BID) | ORAL | Status: DC
  Administered 2020-03-30 – 2020-04-01 (×4): 50 mg via ORAL
  Filled 2020-03-30 (×4): qty 1

## 2020-03-30 MED ORDER — HALOPERIDOL 0.5 MG PO TABS: 0.5000 mg | ORAL_TABLET | ORAL | Status: DC | PRN

## 2020-03-30 MED ORDER — MORPHINE SULFATE (PF) 2 MG/ML IV SOLN: 2.0000 mg | INTRAVENOUS | Status: DC | PRN

## 2020-03-30 MED ORDER — HALOPERIDOL LACTATE 5 MG/ML IJ SOLN
0.5000 mg | INTRAMUSCULAR | Status: DC | PRN
  Administered 2020-03-30: 0.5 mg via INTRAVENOUS
  Filled 2020-03-30: qty 1

## 2020-03-30 MED ORDER — MELATONIN 3 MG PO TABS
9.0000 mg | ORAL_TABLET | Freq: Every day | ORAL | Status: DC
  Administered 2020-03-30 – 2020-03-31 (×2): 9 mg via ORAL
  Filled 2020-03-30: qty 3

## 2020-03-30 MED ORDER — CALCIUM CARBONATE 1250 (500 CA) MG PO TABS
1.0000 | ORAL_TABLET | Freq: Every day | ORAL | Status: DC
  Administered 2020-03-30 – 2020-04-01 (×3): 500 mg via ORAL
  Filled 2020-03-30 (×3): qty 1

## 2020-03-30 MED ORDER — ONDANSETRON HCL 4 MG PO TABS: 4.0000 mg | ORAL_TABLET | Freq: Four times a day (QID) | ORAL | Status: DC | PRN

## 2020-03-30 MED ORDER — BISACODYL 10 MG RE SUPP
10.0000 mg | Freq: Every morning | RECTAL | Status: DC
  Filled 2020-03-30 (×2): qty 1

## 2020-03-30 MED ORDER — TRAMADOL HCL 50 MG PO TABS
50.0000 mg | ORAL_TABLET | Freq: Four times a day (QID) | ORAL | Status: DC | PRN
  Administered 2020-04-01 (×2): 50 mg via ORAL
  Filled 2020-03-30 (×2): qty 1

## 2020-03-30 NOTE — Progress Notes (Signed)
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 The patient was recently hospitalized from 03/16/2020 to 03/22/2020 secondary to a partial small bowel obstruction.  At the time of discharge, the patient was alert and oriented x3 although he would have episodes of sundowning during the hospitalization.  Nevertheless, the patient became increasingly confused at the skilled nursing facility.  As result he was transferred to the emergency department for further evaluation. UA shows 11-20 WBC with urine culture growing Klebsiella pneumonia.  CT of the abdomen and pelvis showed dilated multiple small bowel loops up to 3.3 cm without a transition zone.  There was a enhancing prostate mass measuring 4 x 5 cm with invasion to the base of the bladder as well as a rectal wall with ulceration.  This was similar to her prior CTs.  There is also some mild bilateral hydronephrosis and new small bilateral pleural effusions. The patient was started on IV ceftriaxone.  Palliative medicine was consulted to assist with management.  Assessment/Plan: Acute metabolic encephalopathy -due to CAUTI -he haschronicsuprapubic catheter -03/25/20 CT abd--obstruction at the level of the ureter pelvic junctions, secondary to mass effect from pelvic mass. -treated with antibiotics(see below).  UTI/CAUTI--Klebsiella -Resistant to ampicillin -Completeda total of 5 daysof rocephin. -No future treatment anticipated other than symptomatic managementand full comfort care. -patient is afebrile.  Metastatic prostate  cancer -Patient finished 4 days of radiation prior to last admission CTAP from 02/22/2020 which shows prostatic mass stable at 5.7 x 4.7 cm. However appears to increasingly directly invade the anterior mid to lower rectal wall. Bone metastasis in the spine and pelvic girdle are stable with no new metastasis. No adenopathy. -Enzalutamide started on 02/09/2019; now discontinued. -follow up Dr. Petra Kuba with him 5/6-->restart Gillermina Phy  -initially looking to follow up with Dr. Irine Seal, in [place] office 04/05/20 at 2PM to determine continuation of Lupron injections; at this point plan is for comfort care only. -per Dr. Tresa Moore (urology service)-->not a candidate for invasive intervention -will focus on comfort care and symptomatic management only. -started on TID tylenol to assist with pain.  Partial small bowel obstruction/Ileus -Continue IV fluids -03/25/2020 CT abdomen--dilated multiple small bowel loops up to 3.3 cm without transition zone. -continuemiralax and bisacodyl daily -goal is for soft stool daily due to prostate cancer pushing onto rectum -not an operative candidate -03/27/20--hadmultipleBM'son that day -will continue laxatives.  Essential hypertension -continue metoprolol only, mainly to assist with prevention of rebound tachycardia. -BP stableand controlled.  Coronary artery disease -No chest pain presently -continue metoprololto prevent rebound tachycardia. -Plavix and Imdur has been discontinued in the setting of full comfort care and minimization of pillburden.  Hyperlipidemia -statindiscontinuedin the setting of comfort care onlyand minimize pills burden.  Diabetes mellitus type 2 -Continue holding metforminduring hospitalizationand after discharge while pursuing comfort care only. -CBGsoverall stable. -allow for liberal control at this point  Hypokalemia -repleted -will focus oncomfort care only and no further blood work will  be pursued.  Goals of Care -DNR/DNI -palliative medicine following -familyand patient decided comfort care andresidential hospice. -chart now flipped for GIP. -waiting on hospice  bed.   Total time spent 25 minutes.  Greater than 50% spent face to face counseling and coordinating care.   Disposition Plan: Patient From:SNF D/C Place: Residential hospice (when bed available). Barriers: After further discussion with family members following patient's goals of care decisions will transition to comfort care and pursuit hospice placement.  Family Communication:   Daughter updated at bedside 5/13  Consultants:  Palliative medicine; urology by phone  Code Status:  DNR  DVT Prophylaxis:  SCDs   Procedures: As Listed in Progress Note Above  Antibiotics: Ceftriaxone 5/10>>>03/29/20     Subjective: Still complaining of intermittent abdominal discomfort, back pain and also pain in his buttocks.  No fever, no chest pain, no nausea, no vomiting.  Poor oral intake.  Feeling weak and tired.   Objective: Vitals:   03/29/20 1500 03/29/20 1519  BP: 122/68   Pulse: 70   Resp: 19   Temp: 97.8 F (36.6 C)   TempSrc: Oral   SpO2: 98% 97%    Intake/Output Summary (Last 24 hours) at 03/30/2020 1149 Last data filed at 03/30/2020 0500 Gross per 24 hour  Intake 480 ml  Output 2850 ml  Net -2370 ml   Weight change:   Exam: General exam: Alert, awake, oriented x 3; no fever, no chest pain, no nausea, no vomiting.  Continue complaining of abdominal pain, lower back and pain in his buttocks.  No shortness of breath. Respiratory system: Good air movement bilaterally. Respiratory effort normal. Cardiovascular system: RRR. No murmurs, rubs, gallops. Gastrointestinal system: Abdomen is nondistended, soft and nontender. No organomegaly or masses felt. Normal bowel sounds heard. Central nervous system: Alert and oriented. No focal neurological deficits. Extremities: No cyanosis or  clubbing. Skin: No rashes, no petechiae. Psychiatry: Mood & affect appropriate.    Data Reviewed: I have personally reviewed following labs and imaging studies  Basic Metabolic Panel: Recent Labs  Lab 03/25/20 0152 03/27/20 0507 03/28/20 0541  NA 134* 137 137  K 3.6 2.8* 2.7*  CL 100 105 106  CO2 25 24 25   GLUCOSE 101* 112* 107*  BUN 10 6* 6*  CREATININE 0.42* 0.43* 0.45*  CALCIUM 8.1* 7.8* 7.3*  MG  --   --  1.9  PHOS  --   --  2.7   Liver Function Tests: Recent Labs  Lab 03/25/20 0152  AST 17  ALT 17  ALKPHOS 48  BILITOT 0.8  PROT 5.3*  ALBUMIN 2.4*   Recent Labs  Lab 03/25/20 0152  LIPASE 33   CBC: Recent Labs  Lab 03/25/20 0152 03/27/20 0507 03/28/20 0541  WBC 8.1 9.5 7.0  NEUTROABS 6.4 7.5  --   HGB 11.3* 10.5* 9.5*  HCT 36.6* 34.8* 31.4*  MCV 81.3 82.7 83.3  PLT 361 297 280   Urine analysis:    Component Value Date/Time   COLORURINE YELLOW 03/25/2020 0133   APPEARANCEUR CLOUDY (A) 03/25/2020 0133   LABSPEC 1.011 03/25/2020 0133   PHURINE 6.0 03/25/2020 0133   GLUCOSEU NEGATIVE 03/25/2020 0133   HGBUR SMALL (A) 03/25/2020 0133   BILIRUBINUR NEGATIVE 03/25/2020 0133   KETONESUR 5 (A) 03/25/2020 0133   PROTEINUR 30 (A) 03/25/2020 0133   NITRITE POSITIVE (A) 03/25/2020 0133   LEUKOCYTESUR LARGE (A) 03/25/2020 0133   Sepsis Labs:  Recent Results (from the past 240 hour(s))  MRSA PCR Screening     Status: Abnormal   Collection Time: 03/20/20  3:32 PM   Specimen: Nasal Mucosa; Nasopharyngeal  Result Value Ref Range Status  MRSA by PCR POSITIVE (A) NEGATIVE Final    Comment:        The GeneXpert MRSA Assay (FDA approved for NASAL specimens only), is one component of a comprehensive MRSA colonization surveillance program. It is not intended to diagnose MRSA infection nor to guide or monitor treatment for MRSA infections. RESULT CALLED TO, READ BACK BY AND VERIFIED WITH: Coats ON 03/20/20 AT 2150 BY LOY,C Performed at [facility], [address], [place], [place] [address]   Urine culture     Status: Abnormal   Collection Time: 03/25/20  1:33 AM   Specimen: Urine, Catheterized  Result Value Ref Range Status   Specimen Description   Final    URINE, CATHETERIZED Performed at [facility], [address] [place], [place] [address]    Special Requests   Final    Normal Performed at [facility], [address], [place], [place] [address]    Culture >=100,000 COLONIES/mL KLEBSIELLA PNEUMONIAE (A)  Final   Report Status 03/27/2020 FINAL  Final   Organism ID, Bacteria KLEBSIELLA PNEUMONIAE (A)  Final      Susceptibility   Klebsiella pneumoniae - MIC*    AMPICILLIN RESISTANT Resistant     CEFAZOLIN <=4 SENSITIVE Sensitive     CEFTRIAXONE <=1 SENSITIVE Sensitive     CIPROFLOXACIN <=0.25 SENSITIVE Sensitive     GENTAMICIN <=1 SENSITIVE Sensitive     IMIPENEM <=0.25 SENSITIVE Sensitive     NITROFURANTOIN 64 INTERMEDIATE Intermediate     TRIMETH/SULFA <=20 SENSITIVE Sensitive     AMPICILLIN/SULBACTAM 4 SENSITIVE Sensitive     PIP/TAZO <=4 SENSITIVE Sensitive     * >=100,000 COLONIES/mL KLEBSIELLA PNEUMONIAE  SARS Coronavirus 2 by RT PCR (hospital order, performed in [place] hospital lab) Nasopharyngeal Nasopharyngeal Swab     Status: None   Collection Time: 03/25/20  9:27 AM   Specimen: Nasopharyngeal Swab  Result Value Ref Range Status   SARS Coronavirus 2 NEGATIVE NEGATIVE Final    Comment: (NOTE) SARS-CoV-2 target nucleic acids are NOT DETECTED. The SARS-CoV-2 RNA is generally detectable in upper and lower respiratory specimens during the acute phase of infection. The lowest concentration of SARS-CoV-2 viral copies this assay can detect is 250 copies / mL. A negative result does not preclude SARS-CoV-2 infection and should not be used as the sole basis for treatment or other patient management decisions.  A negative result may occur with improper specimen collection / handling, submission of specimen  other than nasopharyngeal swab, presence of viral mutation(s) within the areas targeted by this assay, and inadequate number of viral copies (<250 copies / mL). A negative result must be combined with clinical observations, patient history, and epidemiological information. Fact Sheet for Patients:   StrictlyIdeas.no Fact Sheet for Healthcare Providers: BankingDealers.co.za This test is not yet approved or cleared  by the [place] FDA and has been authorized for detection and/or diagnosis of SARS-CoV-2 by FDA under an Emergency Use Authorization (EUA).  This EUA will remain in effect (meaning this test can be used) for the duration of the COVID-19 declaration under Section 564(b)(1) of the Act, 21 U.S.C. section 360bbb-3(b)(1), unless the authorization is terminated or revoked sooner. Performed at [facility], [address] [place],  [address]      Scheduled Meds:  acetaminophen  650 mg Oral TID   bisacodyl  10 mg Rectal q morning - 10a   calcium carbonate  1 tablet Oral Daily   Chlorhexidine Gluconate Cloth  6 each Topical Daily   [  START ON 03/31/2020] feeding supplement  1 Container Oral Q breakfast   feeding supplement (ENSURE ENLIVE)  237 mL Oral BID BM   lidocaine  1 patch Transdermal Q24H   linaclotide  290 mcg Oral QAC breakfast   [START ON 03/31/2020] linaclotide  290 mcg Oral QAC breakfast   melatonin  9 mg Oral QHS   metoprolol tartrate  50 mg Oral BID   pantoprazole  40 mg Oral q morning - 10a   polyethylene glycol  17 g Oral Daily   Continuous Infusions:   Procedures/Studies: DG Chest 1 View  Result Date: 03/16/2020 CLINICAL DATA:  Weakness and mild shortness of breath. ACS. EXAM: CHEST  1 VIEW COMPARISON:  09/10/2016 FINDINGS: Post median sternotomy and CABG. The heart is normal in size. Aortic atherosclerosis and tortuosity, stable from prior exam. No pulmonary edema, focal airspace disease,  pleural effusion or pneumothorax. No acute osseous abnormalities are seen. IMPRESSION: 1. No acute abnormality. 2. Post CABG with normal heart size. Aortic Atherosclerosis (ICD10-I70.0). Electronically Signed   By: Keith Rake M.D.   On: 03/16/2020 15:40   CT Head Wo Contrast  Result Date: 03/25/2020 CLINICAL DATA:  86 year old male with delirium and optimal mental status. EXAM: CT HEAD WITHOUT CONTRAST TECHNIQUE: Contiguous axial images were obtained from the base of the skull through the vertex without intravenous contrast. COMPARISON:  None. FINDINGS: Brain: There is mild age-related atrophy and chronic microvascular ischemic changes. There is no acute intracranial hemorrhage. No mass effect or midline shift. No extra-axial fluid collection. Vascular: No hyperdense vessel or unexpected calcification. Skull: Normal. Negative for fracture or focal lesion. Sinuses/Orbits: No acute finding. Other: None IMPRESSION: 1. No acute intracranial pathology. 2. Mild age-related atrophy and chronic microvascular ischemic changes. Electronically Signed   By: Anner Crete M.D.   On: 03/25/2020 02:15   CT Abdomen Pelvis W Contrast  Result Date: 03/25/2020 CLINICAL DATA:  86 year old male with abdominal distension EXAM: CT ABDOMEN AND PELVIS WITH CONTRAST TECHNIQUE: Multidetector CT imaging of the abdomen and pelvis was performed using the standard protocol following bolus administration of intravenous contrast. CONTRAST:  134mL OMNIPAQUE IOHEXOL 300 MG/ML  SOLN COMPARISON:  CT abdomen pelvis dated 03/16/2020. FINDINGS: Lower chest: Partially visualized small bilateral pleural effusions, new since the prior CT. There is associated partial compressive atelectasis of the lower lobes. Pneumonia is not excluded. Clinical correlation is recommended. There is advanced 3 vessel coronary vascular calcification. No intra-abdominal free air. Small ascites, new since the prior CT. Hepatobiliary: The liver is unremarkable. No  intrahepatic biliary ductal dilatation. The gallbladder is unremarkable. Pancreas: Unremarkable. No pancreatic ductal dilatation or surrounding inflammatory changes. Spleen: Normal in size without focal abnormality. Adrenals/Urinary Tract: The adrenal glands are unremarkable. There is mild bilateral hydronephrosis, left greater right, and new since the prior CT. Small bilateral renal cysts and additional subcentimeter hypodensities which are too small to characterize. There is mild bilateral hydronephrosis. There is diffuse thickened and irregular bladder wall which may be related to chronic bladder outlet obstruction or chronic infection. Infiltrative neoplasm of the bladder wall is not excluded. There is probable degree of obstruction of the ureterovesical junctions resulting in bilateral hydronephrosis. The urinary bladder is decompressed around a suprapubic catheter. Air within the bladder likely introduced via catheter. Stomach/Bowel: The stomach is distended. There is dilatation of multiple loops of small bowel measuring up to 3.3 cm in caliber. No discrete transition identified. Findings may represent an ileus although an early obstruction is not entirely excluded clinical correlation is  recommended. There is sigmoid diverticulosis without active inflammatory changes. There is moderate stool throughout the colon. The appendix is not visualized with certainty. No inflammatory changes identified in the right lower quadrant. Vascular/Lymphatic: Advanced aortoiliac atherosclerotic disease. The IVC is unremarkable. No portal venous gas. There is no adenopathy. Reproductive: Irregular and heterogeneously enhancing mass in the region of the prostate gland measuring 4 x 5 cm in greatest axial dimension as seen previously. There is invasion of the base of the bladder. There is also invasion of the anterior rectal wall with ulceration. This is relatively similar to prior CT. Other: Mild subcutaneous edema.  Musculoskeletal: Osteopenia with degenerative changes of the spine. Similar appearance of scattered sclerotic lesions. No acute osseous pathology. IMPRESSION: 1. Interval development of mild bilateral hydronephrosis, likely due to obstruction at the level of the ureterovesical junctions secondary to mass effect caused by the pelvic mass or due to UVJ strictures secondary to tumoral invasion. 2. Probable ileus. An early small-bowel obstruction is not excluded. 3. Sigmoid diverticulosis. 4. Enlarged and irregular pelvic mass with invasion of the base of the bladder and anterior rectal wall similar to prior CT. 5. New small bilateral pleural effusions with partial compressive atelectasis of the lower lobes. 6. Aortic Atherosclerosis (ICD10-I70.0). Electronically Signed   By: Anner Crete M.D.   On: 03/25/2020 03:48   CT ABDOMEN PELVIS W CONTRAST  Result Date: 03/16/2020 CLINICAL DATA:  Acute generalized abdominal pain. Nausea. Weakness. Patient reports constipation. Active chemotherapy. History of prostate cancer. EXAM: CT ABDOMEN AND PELVIS WITH CONTRAST TECHNIQUE: Multidetector CT imaging of the abdomen and pelvis was performed using the standard protocol following bolus administration of intravenous contrast. CONTRAST:  163mL OMNIPAQUE IOHEXOL 300 MG/ML  SOLN COMPARISON:  Abdominal CT 02/22/2020 FINDINGS: Lower chest: Subsegmental linear atelectasis in the right lower lobe. Heart is normal in size. There are coronary artery calcifications. Fluid distends the distal esophagus without wall thickening. Hepatobiliary: Borderline hepatic steatosis without focal lesion. Gallstone without pericholecystic inflammation. No biliary dilatation. Pancreas: No ductal dilatation or inflammation. Spleen: Slightly heterogeneous but no evidence of focal lesion. Normal in size. Adrenals/Urinary Tract: No adrenal nodule. No hydronephrosis. Chronic fullness of the right renal pelvis. There is symmetric renal excretion on delayed  phase imaging. Cortical cyst in the right kidney, as well as bilateral low-density lesions too small to accurately characterize. Mild cortical scarring in the lower left kidney unchanged. Suprapubic tube decompresses the urinary bladder. Diffuse bladder wall thickening which is stable from prior. Stomach/Bowel: Fluid distends the distal esophagus. Distended fluid-filled stomach. Proximal small bowel are dilated and fluid-filled. Transition from dilated to nondilated small bowel in the right abdomen, series 5, image 37, series 2, image 49. The more distal small bowel is decompressed. There is no small bowel pneumatosis. Minimal mesenteric edema in the right abdomen with trace free fluid. Appendix not definitively visualized. The ascending colon is decompressed. Moderate volume of stool in the transverse, descending, and sigmoid colon. Sigmoid diverticulosis without focal diverticulitis. Possible invasion of the anterior rectal wall by prostatic mass, series 2, image 89, also seen on prior exam. Vascular/Lymphatic: Aorto bi-iliac atherosclerosis. No aortic aneurysm. The portal vein is patent. Mesenteric vessels are patent. No abdominopelvic adenopathy. Reproductive: Irregular low-density mass within the prostate, similar to prior measuring 5.6 x 4.6 cm, series 2, image 86. This likely invades the anterior wall of the rectum. Other: Trace free fluid in the right abdomen. No other ascites. No free air. No intra-abdominal abscess. Musculoskeletal: Scattered sclerotic lesions in the pelvis are  unchanged. Scattered sclerotic lesions in the spine, also unchanged. No pathologic fracture. IMPRESSION: 1. Small-bowel obstruction with transition point in the right abdomen, likely due to adhesions. 2. Moderate stool in the transverse, descending, and sigmoid colon. Distal diverticulosis without diverticulitis. 3. Irregular low-density mass within the prostate, likely invades the anterior wall of the rectum, grossly stable over  the last 3 weeks. 4. Suprapubic tube decompresses the urinary bladder. Diffuse bladder wall thickening is chronic and likely related to enlarged prostate gland. 5. Cholelithiasis without pericholecystic inflammation. 6. Sclerotic lesions in the pelvis and spine are unchanged from prior. Aortic Atherosclerosis (ICD10-I70.0). Electronically Signed   By: Keith Rake M.D.   On: 03/16/2020 17:26   DG Chest Portable 1 View  Result Date: 03/16/2020 CLINICAL DATA:  Abdominal pain, nausea, generalized weakness and constipation, history metastatic prostate cancer with ongoing oral chemotherapy EXAM: PORTABLE CHEST 1 VIEW COMPARISON:  Portable exam 2000 hours compared to 1509 hours FINDINGS: Nasogastric tube extends into stomach. Normal heart size post CABG. Mediastinal contours and pulmonary vascularity normal. Atherosclerotic calcification aorta. Lungs clear. No pleural effusion or pneumothorax. Bones demineralized. IMPRESSION: No acute abnormalities. Electronically Signed   By: Lavonia Dana M.D.   On: 03/16/2020 20:08   DG Abd 2 Views  Result Date: 03/17/2020 CLINICAL DATA:  Prostate cancer, abdominal pain, small-bowel obstruction EXAM: ABDOMEN - 2 VIEW COMPARISON:  03/16/2020 CT abdomen/pelvis FINDINGS: Enteric tube terminates in the body of the stomach. No appreciable dilated gas-filled small bowel loops. Prominent colonic stool. No evidence of pneumatosis or pneumoperitoneum. No radiopaque nephrolithiasis. Intact lower sternotomy wires. IMPRESSION: Enteric tube terminates in the body of the stomach. No appreciable dilated gas-filled small bowel loops. Prominent colonic stool. Electronically Signed   By: Ilona Sorrel M.D.   On: 03/17/2020 08:52   DG Abd Portable 1V-Small Bowel Obstruction Protocol-initial, 8 hr delay  Result Date: 03/19/2020 CLINICAL DATA:  Small-bowel obstruction EXAM: PORTABLE ABDOMEN - 1 VIEW COMPARISON:  Mar 19, 2020 FINDINGS: There are persistent dilated loops of small bowel in the mid  abdomen measuring up to approximately 3.7 cm in diameter. There is a large amount of stool in the right hemicolon. There is no pneumatosis. No free air. IMPRESSION: Persistent small bowel obstruction versus ileus. Large amount of stool in the right hemicolon. Electronically Signed   By: Constance Holster M.D.   On: 03/19/2020 20:03   DG Abd Portable 1V-Small Bowel Protocol-Position Verification  Result Date: 03/19/2020 CLINICAL DATA:  Small-bowel obstruction EXAM: PORTABLE ABDOMEN - 1 VIEW COMPARISON:  Portable exam 1053 hours compared to 03/17/2020 FINDINGS: Small amount gas in colon. Few prominent loops of small bowel in the mid abdomen. No bowel wall thickening identified. Subsegmental atelectasis at lung bases. Nasogastric tube projects over stomach. Bones demineralized. IMPRESSION: Few nonspecific dilated loops of small bowel in the mid abdomen. Electronically Signed   By: Lavonia Dana M.D.   On: 03/19/2020 11:27   DG Abd Portable 2 Views  Result Date: 03/25/2020 CLINICAL DATA:  Abdominal pain for 1 week, recent bowel obstruction EXAM: PORTABLE ABDOMEN - 2 VIEW COMPARISON:  03/19/2020 FINDINGS: Supine and upright frontal views of the abdomen and pelvis demonstrate distended gas-filled loops of small bowel measuring up to 4 cm. Overall, caliber slightly increased since prior study. Gas and stool seen throughout the colon to the rectum. No free gas in the greater peritoneal sac. Scattered gas fluid levels in the small bowel on upright projection. IMPRESSION: 1. Distended gas fluid loops of small bowel with multiple gas  fluid levels, consistent with obstruction. Increased caliber of the small bowel since prior study. Electronically Signed   By: Randa Ngo M.D.   On: 03/25/2020 02:28    Barton Dubois, MD  [facility]   03/30/2020, 11:49 AM   LOS: 1 day

## 2020-03-31 LAB — SARS CORONAVIRUS 2 BY RT PCR (HOSPITAL ORDER, PERFORMED IN ~~LOC~~ HOSPITAL LAB): SARS Coronavirus 2: NEGATIVE

## 2020-03-31 NOTE — Progress Notes (Signed)
Gave pt warm milk and graham crackers, warm blanket and decreased noise to allow for rest. Pt stated he was comfortable and pain was managed

## 2020-03-31 NOTE — Progress Notes (Signed)
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 The patient was recently hospitalized from 03/16/2020 to 03/22/2020 secondary to a partial small bowel obstruction.  At the time of discharge, the patient was alert and oriented x3 although he would have episodes of sundowning during the hospitalization.  Nevertheless, the patient became increasingly confused at the skilled nursing facility.  As result he was transferred to the emergency department for further evaluation. UA shows 11-20 WBC with urine culture growing Klebsiella pneumonia.  CT of the abdomen and pelvis showed dilated multiple small bowel loops up to 3.3 cm without a transition zone.  There was a enhancing prostate mass measuring 4 x 5 cm with invasion to the base of the bladder as well as a rectal wall with ulceration.  This was similar to her prior CTs.  There is also some mild bilateral hydronephrosis and new small bilateral pleural effusions. The patient was started on IV ceftriaxone.  Palliative medicine was consulted to assist with management.  Assessment/Plan: Acute metabolic encephalopathy -due to CAUTI -he haschronicsuprapubic catheter -03/25/20 CT abd--obstruction at the level of the ureter pelvic junctions, secondary to mass effect from pelvic mass. -treated with antibiotics(see below).  UTI/CAUTI--Klebsiella -Resistant to ampicillin -Completeda total of 5 daysof rocephin. -No future treatment anticipated other than symptomatic managementand full comfort care. -patient is afebrile.  Metastatic prostate  cancer -Patient finished 4 days of radiation prior to last admission CTAP from 02/22/2020 which shows prostatic mass stable at 5.7 x 4.7 cm. However appears to increasingly directly invade the anterior mid to lower rectal wall. Bone metastasis in the spine and pelvic girdle are stable with no new metastasis. No adenopathy. -Enzalutamide started on 02/09/2019; now discontinued. -follow up Dr. Petra Kuba with him 5/6-->restart Gillermina Phy  -initially looking to follow up with Dr. Irine Seal, in [place] office 04/05/20 at 2PM to determine continuation of Lupron injections; at this point plan is for comfort care only. -per Dr. Tresa Moore (urology service)-->not a candidate for invasive intervention -will focus on comfort care and symptomatic management only. -started on TID tylenol to assist with pain.  Partial small bowel obstruction/Ileus -Continue IV fluids -03/25/2020 CT abdomen--dilated multiple small bowel loops up to 3.3 cm without transition zone. -continuemiralax and bisacodyl daily -goal is for soft stool daily due to prostate cancer pushing onto rectum -not an operative candidate -03/27/20--hadmultipleBM'son that day -will continue laxatives.  Essential hypertension -continue metoprolol only, mainly to assist with prevention of rebound tachycardia. -BP stableand controlled.  Coronary artery disease -No chest pain presently -continue metoprololto prevent rebound tachycardia. -Plavix and Imdur has been discontinued in the setting of full comfort care and minimization of pillburden.  Hyperlipidemia -statindiscontinuedin the setting of comfort care onlyand minimize pills burden.  Diabetes mellitus type 2 -Continue holding metforminduring hospitalizationand after discharge while pursuing comfort care only. -CBGsoverall stable. -allow for liberal control at this point  Hypokalemia -repleted -will focus oncomfort care only and no further blood work will  be pursued.  Goals of Care -DNR/DNI -palliative medicine following -familyand patient decided comfort care andresidential hospice. -chart now flipped for GIP. -waiting on hospice  bed.   Total time spent 25 minutes.  Greater than 50% spent face to face counseling and coordinating care.   Disposition Plan: Patient From:SNF D/C Place: Residential hospice (when bed available). Barriers: After further discussion with family members following patient's goals of care decisions will transition to comfort care and pursuit hospice placement.  Family Communication:   Daughter updated at bedside 5/13  Consultants:  Palliative medicine; urology by phone  Code Status:  DNR  DVT Prophylaxis:  SCDs   Procedures: As Listed in Progress Note Above  Antibiotics: Ceftriaxone 5/10>>>03/29/20     Subjective: Continues having intermittent abdominal pain; appetite is poor and oral intake minimal.  Feeling weak.  No major events overnight and overall comfortable with current management.  Objective: Vitals:   03/29/20 1519 03/30/20 1338 03/30/20 2023 03/31/20 0828  BP:   130/68 (!) 150/85  Pulse:   69 70  Resp:   16   Temp:   98.9 F (37.2 C)   TempSrc:   Oral   SpO2: 97%  98%   Weight:  70.5 kg    Height:  5\' 8"  (1.727 m)      Intake/Output Summary (Last 24 hours) at 03/31/2020 1303 Last data filed at 03/31/2020 0846 Gross per 24 hour  Intake 480 ml  Output 1750 ml  Net -1270 ml   Weight change:   Exam: General exam: Alert, awake, oriented x 3; no fever, no chest pain, no requiring oxygen.  Still having intermittent episodes of abdominal pain, lower back and pain in his buttocks.  Appetite remains poor.  Continues to be weak. Respiratory system: Clear to auscultation. Respiratory effort normal. Cardiovascular system:RRR. No murmurs, rubs, gallops. Gastrointestinal system: Abdomen is nondistended, soft and nontender currently. No organomegaly or masses felt. Normal bowel sounds  heard. Central nervous system: Alert and oriented. No focal neurological deficits. Extremities: No cyanosis or clubbing; no edema. Skin: No rashes, no petechiae Psychiatry: Mood & affect appropriate.    Data Reviewed: I have personally reviewed following labs and imaging studies  Basic Metabolic Panel: Recent Labs  Lab 03/25/20 0152 03/27/20 0507 03/28/20 0541  NA 134* 137 137  K 3.6 2.8* 2.7*  CL 100 105 106  CO2 25 24 25   GLUCOSE 101* 112* 107*  BUN 10 6* 6*  CREATININE 0.42* 0.43* 0.45*  CALCIUM 8.1* 7.8* 7.3*  MG  --   --  1.9  PHOS  --   --  2.7   Liver Function Tests: Recent Labs  Lab 03/25/20 0152  AST 17  ALT 17  ALKPHOS 48  BILITOT 0.8  PROT 5.3*  ALBUMIN 2.4*   Recent Labs  Lab 03/25/20 0152  LIPASE 33   CBC: Recent Labs  Lab 03/25/20 0152 03/27/20 0507 03/28/20 0541  WBC 8.1 9.5 7.0  NEUTROABS 6.4 7.5  --   HGB 11.3* 10.5* 9.5*  HCT 36.6* 34.8* 31.4*  MCV 81.3 82.7 83.3  PLT 361 297 280   Urine analysis:    Component Value Date/Time   COLORURINE YELLOW 03/25/2020 0133   APPEARANCEUR CLOUDY (A) 03/25/2020 0133   LABSPEC 1.011 03/25/2020 0133   PHURINE 6.0 03/25/2020 0133   GLUCOSEU NEGATIVE 03/25/2020 0133   HGBUR SMALL (A) 03/25/2020 0133   BILIRUBINUR NEGATIVE 03/25/2020 0133   KETONESUR 5 (A) 03/25/2020 0133   PROTEINUR 30 (A) 03/25/2020 0133   NITRITE POSITIVE (A) 03/25/2020 0133   LEUKOCYTESUR LARGE (A) 03/25/2020 0133   Sepsis Labs:  Recent Results (from the past 240 hour(s))  Urine culture     Status: Abnormal   Collection Time: 03/25/20  1:33 AM   Specimen: Urine, Catheterized  Result Value Ref Range Status   Specimen Description   Final    URINE, CATHETERIZED Performed at [facility], [address] [place], [place] [address]    Special Requests   Final    Normal Performed at [facility], [address] [place], [place] [address]    Culture >=100,000 COLONIES/mL KLEBSIELLA PNEUMONIAE (A)  Final   Report Status  03/27/2020 FINAL  Final   Organism ID, Bacteria KLEBSIELLA PNEUMONIAE (A)  Final      Susceptibility   Klebsiella pneumoniae - MIC*    AMPICILLIN RESISTANT Resistant     CEFAZOLIN <=4 SENSITIVE Sensitive     CEFTRIAXONE <=1 SENSITIVE Sensitive     CIPROFLOXACIN <=0.25 SENSITIVE Sensitive     GENTAMICIN <=1 SENSITIVE Sensitive     IMIPENEM <=0.25 SENSITIVE Sensitive     NITROFURANTOIN 64 INTERMEDIATE Intermediate     TRIMETH/SULFA <=20 SENSITIVE Sensitive     AMPICILLIN/SULBACTAM 4 SENSITIVE Sensitive     PIP/TAZO <=4 SENSITIVE Sensitive     * >=100,000 COLONIES/mL KLEBSIELLA PNEUMONIAE  SARS Coronavirus 2 by RT PCR (hospital order, performed in [place] hospital lab) Nasopharyngeal Nasopharyngeal Swab     Status: None   Collection Time: 03/25/20  9:27 AM   Specimen: Nasopharyngeal Swab  Result Value Ref Range Status   SARS Coronavirus 2 NEGATIVE NEGATIVE Final    Comment: (NOTE) SARS-CoV-2 target nucleic acids are NOT DETECTED. The SARS-CoV-2 RNA is generally detectable in upper and lower respiratory specimens during the acute phase of infection. The lowest concentration of SARS-CoV-2 viral copies this assay can detect is 250 copies / mL. A negative result does not preclude SARS-CoV-2 infection and should not be used as the sole basis for treatment or other patient management decisions.  A negative result may occur with improper specimen collection / handling, submission of specimen other than nasopharyngeal swab, presence of viral mutation(s) within the areas targeted by this assay, and inadequate number of viral copies (<250 copies / mL). A negative result must be combined with clinical observations, patient history, and epidemiological information. Fact Sheet for Patients:   StrictlyIdeas.no Fact Sheet for Healthcare Providers: BankingDealers.co.za This test is not yet approved or cleared  by the [place] FDA and has been  authorized for detection and/or diagnosis of SARS-CoV-2 by FDA under an Emergency Use Authorization (EUA).  This EUA will remain in effect (meaning this test can be used) for the duration of the COVID-19 declaration under Section 564(b)(1) of the Act, 21 U.S.C. section 360bbb-3(b)(1), unless the authorization is terminated or revoked sooner. Performed at [facility], [address] [place], [place] [address]      Scheduled Meds: . acetaminophen  650 mg Oral TID  . bisacodyl  10 mg Rectal q morning - 10a  . calcium carbonate  1 tablet Oral Daily  . Chlorhexidine Gluconate Cloth  6 each Topical Daily  . feeding supplement  1 Container Oral Q breakfast  . feeding supplement (ENSURE ENLIVE)  237 mL Oral BID BM  . lidocaine  1 patch Transdermal Q24H  . linaclotide  290 mcg Oral QAC breakfast  . linaclotide  290 mcg Oral QAC breakfast  . melatonin  9 mg Oral QHS  . metoprolol tartrate  50 mg Oral BID  . pantoprazole  40 mg Oral q morning - 10a  . polyethylene glycol  17 g Oral  Daily   Continuous Infusions:   Procedures/Studies: DG Chest 1 View  Result Date: 03/16/2020 CLINICAL DATA:  Weakness and mild shortness of breath. ACS. EXAM: CHEST  1 VIEW COMPARISON:  09/10/2016 FINDINGS: Post median sternotomy and CABG. The heart is normal in size. Aortic atherosclerosis and tortuosity, stable from prior exam. No pulmonary edema, focal airspace disease, pleural effusion or pneumothorax. No acute osseous abnormalities are seen. IMPRESSION: 1. No acute abnormality. 2. Post CABG with normal heart size. Aortic Atherosclerosis (ICD10-I70.0). Electronically Signed   By: Keith Rake M.D.   On: 03/16/2020 15:40   CT Head Wo Contrast  Result Date: 03/25/2020 CLINICAL DATA:  86 year old male with delirium and optimal mental status. EXAM: CT HEAD WITHOUT CONTRAST TECHNIQUE: Contiguous axial images were obtained from the base of the skull through the vertex without intravenous contrast. COMPARISON:   None. FINDINGS: Brain: There is mild age-related atrophy and chronic microvascular ischemic changes. There is no acute intracranial hemorrhage. No mass effect or midline shift. No extra-axial fluid collection. Vascular: No hyperdense vessel or unexpected calcification. Skull: Normal. Negative for fracture or focal lesion. Sinuses/Orbits: No acute finding. Other: None IMPRESSION: 1. No acute intracranial pathology. 2. Mild age-related atrophy and chronic microvascular ischemic changes. Electronically Signed   By: Anner Crete M.D.   On: 03/25/2020 02:15   CT Abdomen Pelvis W Contrast  Result Date: 03/25/2020 CLINICAL DATA:  86 year old male with abdominal distension EXAM: CT ABDOMEN AND PELVIS WITH CONTRAST TECHNIQUE: Multidetector CT imaging of the abdomen and pelvis was performed using the standard protocol following bolus administration of intravenous contrast. CONTRAST:  197mL OMNIPAQUE IOHEXOL 300 MG/ML  SOLN COMPARISON:  CT abdomen pelvis dated 03/16/2020. FINDINGS: Lower chest: Partially visualized small bilateral pleural effusions, new since the prior CT. There is associated partial compressive atelectasis of the lower lobes. Pneumonia is not excluded. Clinical correlation is recommended. There is advanced 3 vessel coronary vascular calcification. No intra-abdominal free air. Small ascites, new since the prior CT. Hepatobiliary: The liver is unremarkable. No intrahepatic biliary ductal dilatation. The gallbladder is unremarkable. Pancreas: Unremarkable. No pancreatic ductal dilatation or surrounding inflammatory changes. Spleen: Normal in size without focal abnormality. Adrenals/Urinary Tract: The adrenal glands are unremarkable. There is mild bilateral hydronephrosis, left greater right, and new since the prior CT. Small bilateral renal cysts and additional subcentimeter hypodensities which are too small to characterize. There is mild bilateral hydronephrosis. There is diffuse thickened and irregular  bladder wall which may be related to chronic bladder outlet obstruction or chronic infection. Infiltrative neoplasm of the bladder wall is not excluded. There is probable degree of obstruction of the ureterovesical junctions resulting in bilateral hydronephrosis. The urinary bladder is decompressed around a suprapubic catheter. Air within the bladder likely introduced via catheter. Stomach/Bowel: The stomach is distended. There is dilatation of multiple loops of small bowel measuring up to 3.3 cm in caliber. No discrete transition identified. Findings may represent an ileus although an early obstruction is not entirely excluded clinical correlation is recommended. There is sigmoid diverticulosis without active inflammatory changes. There is moderate stool throughout the colon. The appendix is not visualized with certainty. No inflammatory changes identified in the right lower quadrant. Vascular/Lymphatic: Advanced aortoiliac atherosclerotic disease. The IVC is unremarkable. No portal venous gas. There is no adenopathy. Reproductive: Irregular and heterogeneously enhancing mass in the region of the prostate gland measuring 4 x 5 cm in greatest axial dimension as seen previously. There is invasion of the base of the bladder. There is also invasion of  the anterior rectal wall with ulceration. This is relatively similar to prior CT. Other: Mild subcutaneous edema. Musculoskeletal: Osteopenia with degenerative changes of the spine. Similar appearance of scattered sclerotic lesions. No acute osseous pathology. IMPRESSION: 1. Interval development of mild bilateral hydronephrosis, likely due to obstruction at the level of the ureterovesical junctions secondary to mass effect caused by the pelvic mass or due to UVJ strictures secondary to tumoral invasion. 2. Probable ileus. An early small-bowel obstruction is not excluded. 3. Sigmoid diverticulosis. 4. Enlarged and irregular pelvic mass with invasion of the base of the  bladder and anterior rectal wall similar to prior CT. 5. New small bilateral pleural effusions with partial compressive atelectasis of the lower lobes. 6. Aortic Atherosclerosis (ICD10-I70.0). Electronically Signed   By: Anner Crete M.D.   On: 03/25/2020 03:48   CT ABDOMEN PELVIS W CONTRAST  Result Date: 03/16/2020 CLINICAL DATA:  Acute generalized abdominal pain. Nausea. Weakness. Patient reports constipation. Active chemotherapy. History of prostate cancer. EXAM: CT ABDOMEN AND PELVIS WITH CONTRAST TECHNIQUE: Multidetector CT imaging of the abdomen and pelvis was performed using the standard protocol following bolus administration of intravenous contrast. CONTRAST:  131mL OMNIPAQUE IOHEXOL 300 MG/ML  SOLN COMPARISON:  Abdominal CT 02/22/2020 FINDINGS: Lower chest: Subsegmental linear atelectasis in the right lower lobe. Heart is normal in size. There are coronary artery calcifications. Fluid distends the distal esophagus without wall thickening. Hepatobiliary: Borderline hepatic steatosis without focal lesion. Gallstone without pericholecystic inflammation. No biliary dilatation. Pancreas: No ductal dilatation or inflammation. Spleen: Slightly heterogeneous but no evidence of focal lesion. Normal in size. Adrenals/Urinary Tract: No adrenal nodule. No hydronephrosis. Chronic fullness of the right renal pelvis. There is symmetric renal excretion on delayed phase imaging. Cortical cyst in the right kidney, as well as bilateral low-density lesions too small to accurately characterize. Mild cortical scarring in the lower left kidney unchanged. Suprapubic tube decompresses the urinary bladder. Diffuse bladder wall thickening which is stable from prior. Stomach/Bowel: Fluid distends the distal esophagus. Distended fluid-filled stomach. Proximal small bowel are dilated and fluid-filled. Transition from dilated to nondilated small bowel in the right abdomen, series 5, image 37, series 2, image 49. The more distal  small bowel is decompressed. There is no small bowel pneumatosis. Minimal mesenteric edema in the right abdomen with trace free fluid. Appendix not definitively visualized. The ascending colon is decompressed. Moderate volume of stool in the transverse, descending, and sigmoid colon. Sigmoid diverticulosis without focal diverticulitis. Possible invasion of the anterior rectal wall by prostatic mass, series 2, image 89, also seen on prior exam. Vascular/Lymphatic: Aorto bi-iliac atherosclerosis. No aortic aneurysm. The portal vein is patent. Mesenteric vessels are patent. No abdominopelvic adenopathy. Reproductive: Irregular low-density mass within the prostate, similar to prior measuring 5.6 x 4.6 cm, series 2, image 86. This likely invades the anterior wall of the rectum. Other: Trace free fluid in the right abdomen. No other ascites. No free air. No intra-abdominal abscess. Musculoskeletal: Scattered sclerotic lesions in the pelvis are unchanged. Scattered sclerotic lesions in the spine, also unchanged. No pathologic fracture. IMPRESSION: 1. Small-bowel obstruction with transition point in the right abdomen, likely due to adhesions. 2. Moderate stool in the transverse, descending, and sigmoid colon. Distal diverticulosis without diverticulitis. 3. Irregular low-density mass within the prostate, likely invades the anterior wall of the rectum, grossly stable over the last 3 weeks. 4. Suprapubic tube decompresses the urinary bladder. Diffuse bladder wall thickening is chronic and likely related to enlarged prostate gland. 5. Cholelithiasis without pericholecystic inflammation.  6. Sclerotic lesions in the pelvis and spine are unchanged from prior. Aortic Atherosclerosis (ICD10-I70.0). Electronically Signed   By: Keith Rake M.D.   On: 03/16/2020 17:26   DG Chest Portable 1 View  Result Date: 03/16/2020 CLINICAL DATA:  Abdominal pain, nausea, generalized weakness and constipation, history metastatic prostate  cancer with ongoing oral chemotherapy EXAM: PORTABLE CHEST 1 VIEW COMPARISON:  Portable exam 2000 hours compared to 1509 hours FINDINGS: Nasogastric tube extends into stomach. Normal heart size post CABG. Mediastinal contours and pulmonary vascularity normal. Atherosclerotic calcification aorta. Lungs clear. No pleural effusion or pneumothorax. Bones demineralized. IMPRESSION: No acute abnormalities. Electronically Signed   By: Lavonia Dana M.D.   On: 03/16/2020 20:08   DG Abd 2 Views  Result Date: 03/17/2020 CLINICAL DATA:  Prostate cancer, abdominal pain, small-bowel obstruction EXAM: ABDOMEN - 2 VIEW COMPARISON:  03/16/2020 CT abdomen/pelvis FINDINGS: Enteric tube terminates in the body of the stomach. No appreciable dilated gas-filled small bowel loops. Prominent colonic stool. No evidence of pneumatosis or pneumoperitoneum. No radiopaque nephrolithiasis. Intact lower sternotomy wires. IMPRESSION: Enteric tube terminates in the body of the stomach. No appreciable dilated gas-filled small bowel loops. Prominent colonic stool. Electronically Signed   By: Ilona Sorrel M.D.   On: 03/17/2020 08:52   DG Abd Portable 1V-Small Bowel Obstruction Protocol-initial, 8 hr delay  Result Date: 03/19/2020 CLINICAL DATA:  Small-bowel obstruction EXAM: PORTABLE ABDOMEN - 1 VIEW COMPARISON:  Mar 19, 2020 FINDINGS: There are persistent dilated loops of small bowel in the mid abdomen measuring up to approximately 3.7 cm in diameter. There is a large amount of stool in the right hemicolon. There is no pneumatosis. No free air. IMPRESSION: Persistent small bowel obstruction versus ileus. Large amount of stool in the right hemicolon. Electronically Signed   By: Constance Holster M.D.   On: 03/19/2020 20:03   DG Abd Portable 1V-Small Bowel Protocol-Position Verification  Result Date: 03/19/2020 CLINICAL DATA:  Small-bowel obstruction EXAM: PORTABLE ABDOMEN - 1 VIEW COMPARISON:  Portable exam 1053 hours compared to 03/17/2020  FINDINGS: Small amount gas in colon. Few prominent loops of small bowel in the mid abdomen. No bowel wall thickening identified. Subsegmental atelectasis at lung bases. Nasogastric tube projects over stomach. Bones demineralized. IMPRESSION: Few nonspecific dilated loops of small bowel in the mid abdomen. Electronically Signed   By: Lavonia Dana M.D.   On: 03/19/2020 11:27   DG Abd Portable 2 Views  Result Date: 03/25/2020 CLINICAL DATA:  Abdominal pain for 1 week, recent bowel obstruction EXAM: PORTABLE ABDOMEN - 2 VIEW COMPARISON:  03/19/2020 FINDINGS: Supine and upright frontal views of the abdomen and pelvis demonstrate distended gas-filled loops of small bowel measuring up to 4 cm. Overall, caliber slightly increased since prior study. Gas and stool seen throughout the colon to the rectum. No free gas in the greater peritoneal sac. Scattered gas fluid levels in the small bowel on upright projection. IMPRESSION: 1. Distended gas fluid loops of small bowel with multiple gas fluid levels, consistent with obstruction. Increased caliber of the small bowel since prior study. Electronically Signed   By: Randa Ngo M.D.   On: 03/25/2020 02:28    Barton Dubois, MD  [facility]   03/31/2020, 1:03 PM   LOS: 2 days

## 2020-03-31 NOTE — Evaluation (Signed)
Physical Therapy Evaluation Patient Details Name: Elijah Santana MRN: [id number] DOB: 23-Dec-1931 Today's Date: 03/31/2020   History of Present Illness  Elijah Santana is a 86 y.o. 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  At the time of discharge, the patient was alert and oriented x3 although he would have episodes of sundowning during the hospitalization.  Nevertheless, the patient became increasingly confused at the skilled nursing facility.  As result he was transferred to the emergency department for further evaluation.UA shows 11-20 WBC with urine culture growing Klebsiella pneumonia.  CT of the abdomen and pelvis showed dilated multiple small bowel loops up to 3.3 cm without a transition zone.  There was a enhancing prostate mass measuring 4 x 5 cm with invasion to the base of the bladder as well as a rectal wall with ulceration.  This was similar to her prior CTs.  There is also some mild bilateral hydronephrosis and new small bilateral pleural effusions.The patient was treated with IV ceftriaxone and after completing 5 days of antibiotic therapy further goals of care discussion triggers transition to full comfort care and symptomatic management only.  Patient was found to be a good candidate for hospice management and at this moment is admitted for GIP while waiting on bed availability residential hospice place.    Clinical Impression  Patient limited for functional mobility as stated below secondary to BLE weakness, fatigue and poor standing balance.  Patient demonstrates good return for getting  into/out of bed, forward flexed trunk during ambulation and limited mostly due to fatigue.  Patient tolerated staying up in chair after therapy with his daughter present in room.  Patient will benefit from continued physical therapy in hospital and recommended venue below to increase strength, balance, endurance for safe ADLs and gait.     Follow Up Recommendations SNF    Equipment Recommendations  None recommended by PT    Recommendations for Other Services       Precautions / Restrictions Precautions Precautions: Fall Restrictions Weight Bearing Restrictions: No      Mobility  Bed Mobility Overal bed mobility: Modified Independent Bed Mobility: Supine to Sit;Sit to Supine     Supine to sit: Modified independent (Device/Increase time) Sit to supine: Modified independent (Device/Increase time)      Transfers Overall transfer level: Needs assistance Equipment used: Rolling walker (2 wheeled) Transfers: Sit to/from [facility] Sit to Stand: Supervision;Min guard Stand pivot transfers: Supervision;Min guard       General transfer comment: increased time, labored movement  Ambulation/Gait Ambulation/Gait assistance: Min assist Gait Distance (Feet): 55 Feet Assistive device: Rolling walker (2 wheeled) Gait Pattern/deviations: Decreased step length - right;Decreased step length - left;Decreased stride length;Trunk flexed Gait velocity: decreased   General Gait Details: slightly labored slow cadence without loss of balance, increased leaning over RW once fatigued  Stairs            Wheelchair Mobility    Modified Rankin (Stroke Patients Only)       Balance Overall balance assessment: Needs assistance Sitting-balance support: Feet supported;No upper extremity supported Sitting balance-Leahy Scale: Fair Sitting balance - Comments: fair/good seated at EOB  Standing balance support: During functional activity;Bilateral upper extremity  supported Standing balance-Leahy Scale: Fair Standing balance comment: using RW                             Pertinent Vitals/Pain Pain Assessment: Faces Faces Pain Scale: Hurts a little bit Pain Location: bilateral hips and stomach Pain Descriptors / Indicators: Aching Pain Intervention(s): Limited activity within patient's tolerance;Monitored during session;Repositioned    Home Living Family/patient expects to be discharged to:: Private residence Living Arrangements: Alone Available Help at Discharge: Personal care attendant;Available PRN/intermittently Type of Home: House Home Access: Ramped entrance     Home Layout: One level;Able to live on main level with bedroom/bathroom;Laundry or work area in basement;Other (Comment) Home Equipment: Walker - 2 wheels;Wheelchair - manual      Prior Function Level of Independence: Needs assistance   Gait / Transfers Assistance Needed: household ambulator using RW  ADL's / Homemaking Assistance Needed: home aides from 8 am - 9 pm x 7 days/week, no help at night        Hand Dominance   Dominant Hand: Right    Extremity/Trunk Assessment   Upper Extremity Assessment Upper Extremity Assessment: Generalized weakness    Lower Extremity Assessment Lower Extremity Assessment: Generalized weakness    Cervical / Trunk Assessment Cervical / Trunk Assessment: Kyphotic  Communication   Communication: No difficulties  Cognition Arousal/Alertness: Awake/alert Behavior During Therapy: WFL for tasks assessed/performed Overall Cognitive Status: History of cognitive impairments - at baseline                                        General Comments      Exercises     Assessment/Plan    PT Assessment Patient needs continued PT services  PT Problem List Decreased strength;Decreased activity tolerance;Decreased balance;Decreased mobility       PT Treatment Interventions Gait training;Stair  training;Patient/family education;Functional mobility training;Therapeutic activities;Therapeutic exercise    PT Goals (Current goals can be found in the Care Plan section)  Acute Rehab PT Goals Patient Stated Goal: return home after rehab PT Goal Formulation: With patient/family Time For Goal Achievement: 04/14/20 Potential to Achieve Goals: Good    Frequency Min 3X/week   Barriers to discharge        Co-evaluation               AM-PAC PT "6 Clicks" Mobility  Outcome Measure Help needed turning from your back to your side while in a flat bed without using bedrails?: None Help needed moving from lying on your back to sitting on the side of a flat bed without using bedrails?: None Help needed moving to and from a bed to a chair (including a wheelchair)?: A Little Help needed standing up from a chair using your arms (e.g., wheelchair or bedside chair)?: A Little Help needed to walk in hospital room?: A Little Help needed climbing 3-5 steps with a railing? : A Lot 6 Click Score: 19    End of Session   Activity Tolerance: Patient tolerated treatment well;Patient limited by fatigue Patient left: in chair;with call bell/phone within reach;with family/visitor present Nurse Communication: Mobility status PT Visit Diagnosis: Unsteadiness on feet (R26.81);Other abnormalities of gait and mobility (R26.89);Muscle weakness (generalized) (M62.81)    Time: [id number] PT Time Calculation (min) (ACUTE ONLY): 22 min   Charges:   PT Evaluation $  PT Eval Moderate Complexity: 1 Mod PT Treatments $Therapeutic Activity: 8-22 mins        12:00 PM, 03/31/20 Lonell Grandchild, MPT Physical Therapist with [facility] [phone] office [phone] mobile phone

## 2020-03-31 NOTE — Plan of Care (Signed)
  Problem: Acute Rehab PT Goals(only PT should resolve) Goal: Pt Will Go Supine/Side To Sit Outcome: Progressing Flowsheets (Taken 03/31/2020 1202) Pt will go Supine/Side to Sit:  Independently  with modified independence Goal: Patient Will Transfer Sit To/From Stand Outcome: Progressing Flowsheets (Taken 03/31/2020 1202) Patient will transfer sit to/from stand:  with supervision  with min guard assist Goal: Pt Will Transfer Bed To Chair/Chair To Bed Outcome: Progressing Flowsheets (Taken 03/31/2020 1202) Pt will Transfer Bed to Chair/Chair to Bed:  with supervision  min guard assist Goal: Pt Will Ambulate Outcome: Progressing Flowsheets (Taken 03/31/2020 1202) Pt will Ambulate:  75 feet  with min guard assist  with minimal assist  with rolling walker   12:02 PM, 03/31/20 Lonell Grandchild, MPT Physical Therapist with [facility] [phone] office [phone] mobile phone

## 2020-04-01 ENCOUNTER — Ambulatory Visit: Payer: Medicare HMO

## 2020-04-01 DIAGNOSIS — N3001 Acute cystitis with hematuria: Secondary | ICD-10-CM

## 2020-04-01 DIAGNOSIS — Z66 Do not resuscitate: Secondary | ICD-10-CM

## 2020-04-01 DIAGNOSIS — Z951 Presence of aortocoronary bypass graft: Secondary | ICD-10-CM

## 2020-04-01 DIAGNOSIS — C61 Malignant neoplasm of prostate: Secondary | ICD-10-CM

## 2020-04-01 DIAGNOSIS — Z515 Encounter for palliative care: Principal | ICD-10-CM

## 2020-04-01 MED ORDER — TRAMADOL HCL 50 MG PO TABS: 50.0000 mg | ORAL_TABLET | Freq: Three times a day (TID) | ORAL | Status: AC | PRN

## 2020-04-01 MED ORDER — LORAZEPAM 1 MG PO TABS: 1.0000 mg | ORAL_TABLET | Freq: Three times a day (TID) | ORAL | Status: AC | PRN

## 2020-04-01 MED ORDER — ENSURE ENLIVE PO LIQD: 237.0000 mL | Freq: Two times a day (BID) | ORAL | 12 refills | Status: AC

## 2020-04-01 MED ORDER — ACETAMINOPHEN 325 MG PO TABS: 650.0000 mg | ORAL_TABLET | Freq: Three times a day (TID) | ORAL | Status: AC

## 2020-04-01 MED ORDER — LIDOCAINE 5 % EX PTCH: 1.0000 | MEDICATED_PATCH | CUTANEOUS | Status: AC

## 2020-04-01 MED ORDER — GLYCOPYRROLATE 1 MG PO TABS: 1.0000 mg | ORAL_TABLET | ORAL | Status: AC | PRN

## 2020-04-01 MED ORDER — LORAZEPAM 1 MG PO TABS
1.0000 mg | ORAL_TABLET | Freq: Four times a day (QID) | ORAL | Status: DC | PRN
  Administered 2020-04-01: 1 mg via ORAL
  Filled 2020-04-01: qty 1

## 2020-04-01 MED ORDER — HYDROCODONE-ACETAMINOPHEN 5-325 MG PO TABS: 1.0000 | ORAL_TABLET | Freq: Four times a day (QID) | ORAL | Status: AC | PRN

## 2020-04-01 MED ORDER — HALOPERIDOL 0.5 MG PO TABS: 0.5000 mg | ORAL_TABLET | Freq: Four times a day (QID) | ORAL | Status: AC | PRN

## 2020-04-01 NOTE — Progress Notes (Signed)
Phone call out to [facility] at [phone] and gave report to Dorien Chihuahua LPN.  Family will transport patient via family car to [employer] after they visit family home for patient to say goodbye to his dogs.  Discharge p/w and COVID test faxed to [facility] prior to physically leaving [facility]. DNR and discharge packet given to family member upon discharge with instructions to release to the [facility]. Family is aware of instructions and agreed.  Elodia Florence RN

## 2020-04-01 NOTE — Progress Notes (Signed)
Daily Progress Note   Patient Name: Elijah Santana       Date: 04/01/2020 DOB: 11/03/1932  Age: 86 y.o. MRN#: [id number] Attending Physician: No att. providers found Primary Care Physician: Glenda Chroman, MD Admit Date: 03/29/2020  Reason for Consultation/Follow-up: Establishing goals of care and Terminal Care  Subjective: Patient in bed getting dressed by family. Being discharged to [place] today. No symptom management needs. No complaints.  Plan is to drive him by his home on the way.   Length of Stay: 3  Current Medications: Scheduled Meds:  . acetaminophen  650 mg Oral TID  . bisacodyl  10 mg Rectal q morning - 10a  . calcium carbonate  1 tablet Oral Daily  . Chlorhexidine Gluconate Cloth  6 each Topical Daily  . feeding supplement  1 Container Oral Q breakfast  . feeding supplement (ENSURE ENLIVE)  237 mL Oral BID BM  . lidocaine  1 patch Transdermal Q24H  . linaclotide  290 mcg Oral QAC breakfast  . linaclotide  290 mcg Oral QAC breakfast  . melatonin  9 mg Oral QHS  . metoprolol tartrate  50 mg Oral BID  . pantoprazole  40 mg Oral q morning - 10a  . polyethylene glycol  17 g Oral Daily    Continuous Infusions:   PRN Meds: antiseptic oral rinse, glycopyrrolate **OR** glycopyrrolate **OR** glycopyrrolate, haloperidol **OR** [DISCONTINUED] haloperidol **OR** haloperidol lactate, HYDROcodone-acetaminophen, HYDROcodone-acetaminophen, LORazepam, morphine injection, ondansetron **OR** ondansetron (ZOFRAN) IV, polyvinyl alcohol, traMADol       Vital Signs: BP 121/75   Pulse 79   Temp 98.9 F (37.2 C) (Oral)   Resp 16   Ht 5\' 8"  (1.727 m)   Wt 70.5 kg   SpO2 99%   BMI 23.63 kg/m  SpO2: SpO2: 99 % O2 Device: O2 Device: Room Air O2 Flow Rate:    Intake/output  summary:   Intake/Output Summary (Last 24 hours) at 04/01/2020 1346 Last data filed at 04/01/2020 0900 Gross per 24 hour  Intake 480 ml  Output 3300 ml  Net -2820 ml   LBM: Last BM Date: 03/31/20 Baseline Weight: Weight: 70.5 kg Most recent weight: Weight: 70.5 kg       Palliative Assessment/Data: PPS: 10%     Patient Active Problem List   Diagnosis Date Noted  .  Bilateral ureteral obstruction   . Weakness   . DNR (do not resuscitate)   . Altered mental status, unspecified 03/25/2020  . Palliative care by specialist   . Paroxysmal atrial fibrillation ([place]) 03/17/2020  . Suprapubic catheter ([place]) 03/17/2020  . Small bowel obstruction due to adhesions ([place]) 03/16/2020  . Goals of care, counseling/discussion 10/25/2019  . Family history of pancreatic cancer   . Family history of brain cancer   . Rectal pain 08/08/2019  . Lower abdominal pain 08/08/2019  . Urinary retention 03/24/2019  . Bilateral hydronephrosis 03/24/2019  . Uncontrolled hypertension 03/24/2019  . Urinary tract infection with hematuria 03/24/2019  . Anemia 03/24/2019  . Bone metastasis ([place]) 04/08/2018  . Prostate cancer ([place]) 11/27/2017  . Atrial fibrillation, rapid -new 09/10/16 09/10/2016  . PAF- in setting of NSTEMI 09/10/2016  . Accelerating angina ([place])   . CAD S/P percutaneous coronary angioplasty   . Coronary artery disease with hx of myocardial infarct w/o hx of CABG 03/21/2015  . Ischemic cardiomyopathy   . Hyperglycemia   . Essential hypertension   . BPH (benign prostatic hyperplasia)   . NSTEMI- Troponin peak 6/47 12/31/2014  . S/P CABG x 4 2004 12/31/2014  . GERD (gastroesophageal reflux disease) 04/29/2012  . Mixed hyperlipidemia   . Essential hypertension, benign 08/06/2009    Palliative Care Assessment & Plan   Patient Profile: 86 y.o.malewith past medical history of castrate resistant prostate cancer with metastases to lymph nodes and bone on Lupron injections followed by  oncology, NSTEMI, HTN, CVA, paroxysmal atrial fibrillation on Eliquis, GERD, HLD, chronic constipation, suprapubic cathadmitted on 5/1/2021with abdominal pain and weakness.CT abdomen/pelvis 5/1 revealed small bowel obstruction with transition point in the right abdomen likely secondary to adhesions. General surgery following. NGT removed on 5/5 and patient started on clear liquid diet. No indication for surgery. Palliative medicine consultation for goals of care.   Patient is followed by [name]. He is taking enzalutamide without side effects. Currently receiving palliative radiation. Receives Lupron injections from [name] and suprapubic cath will be changed once a month. Previous CT from 02/22/20 shows stable prostate mass however directly invading the anterior mid to low rectal wall. Bone mets in the spine and pelvic girdle stable. No adenopathy. Last PSA 0.05.  Hospital readmission 03/25/20 with altered mental status and combativeness at [facility] rehab. Abdominal xray with distended gas-filled loops of small bowel with multiple fluid levels consistent with obstruction. CT abdomen/pelvis revealed interval devolopment of mild bilateral hydronephrosis likely due to obstruction, secondary to mass effect caused by pelvic mass or due to ureter vesical junction strictures secondary to tumoral invasion. Possible ileus. Enlarged and irregular pelvic region of the base of bladder and anterior rectal wall. [name] discussed case with [name] with urology. Due to advanced and progressive prostate cancer, patient with poor prognosis and urology recommending palliative. No invasive intervention recommended. Palliative re-consulted for goals of care/terminal care.   Assessment/Recommendations/Plan   Full comfort care  [facility]  Goals of Care and Additional Recommendations:  Limitations on Scope of Treatment: Full Comfort Care  Code Status:  DNR  Prognosis:   < 4  weeks  Discharge Planning:  Hospice facility  Care plan was discussed with family and nursing.  Thank you for allowing the Palliative Medicine Team to assist in the care of this patient.   Time In: 1300 Time Out: 1315 Total Time 15 mins Prolonged Time Billed no      Greater than 50%  of this time was spent counseling and coordinating care related to the above assessment and plan.  [name], AGNP-C Palliative Medicine   Please contact Palliative Medicine Team phone at [phone] for questions and concerns.

## 2020-04-01 NOTE — Discharge Summary (Signed)
Physician Discharge Summary  Elijah Santana [id number] DOB: 02/05/1932 DOA: 03/29/2020  PCP: Glenda Chroman, MD  Admit date: 03/29/2020 Discharge date: 04/01/2020  Time spent: 35 minutes  Recommendations for Outpatient Follow-up:  1. Full comfort care 2. End-of-life/symptomatic management only.   Discharge Diagnoses:  Principal Problem:   Prostate cancer [facility]) Active Problems:   GERD (gastroesophageal reflux disease)   S/P CABG x 4 2004   Essential hypertension   Bilateral hydronephrosis   Suprapubic catheter ([place])   Bilateral ureteral obstruction   DNR (do not resuscitate)   Discharge Condition: In a stable condition.  No acute complaints.  Discharge to residential hospice; family transporting.  CODE STATUS: DNR  Diet recommendation: Regular diet (watching sodium content).  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 The patient was recently hospitalized from 03/16/2020 to 03/22/2020 secondary to a partial small bowel obstruction. At the time of discharge, the patient was alert and oriented x3 although he would have episodes of sundowning during the hospitalization. Nevertheless, the patient became increasingly confused at the skilled nursing facility. As result he was transferred to the emergency department for further evaluation. UA shows 11-20 WBC with urine culture growing Klebsiella pneumonia. CT of the abdomen and pelvis showed dilated multiple small bowel loops up to 3.3 cm without a transition zone. There was a enhancing prostate mass measuring 4 x 5 cm with invasion to the base of the bladder as  well as a rectal wall with ulceration. This was similar to her prior CTs. There is also some mild bilateral hydronephrosis and new small bilateral pleural effusions. The patient was started on IV ceftriaxone. Palliative medicine was consulted to assist with management.  Hospital Course:  Acute metabolic encephalopathy -due to CAUTI -he haschronicsuprapubic catheter -03/25/20 CT abd--obstruction at the level of the ureter pelvic junctions, secondary to mass effect from pelvic mass. -treated with antibiotics (see below). -Mentation back to baseline prior to discharge.  UTI/CAUTI/hematuria--Klebsiella -Resistant to ampicillin. -Completed a total of 5 days of rocephin. -No future treatment anticipated other than symptomatic managementand full comfort care. -patient is afebrile and in no distress.  Metastatic prostate cancer -Patient finished 4 days of radiation prior to last admission CTAP from 02/22/2020 which shows prostatic mass stable at 5.7 x 4.7 cm. However appears to increasingly directly invade the anterior mid to lower rectal wall. Bone metastasis in the spine and pelvic girdle are stable with no new metastasis. No adenopathy. -Enzalutamide and Lupron--discontinued at this point. ->not a candidate for further invasive intervention -will focus on comfort care and symptomatic management only.  Partial small bowel obstruction/Ileus -03/25/2020 CT abdomen--dilated multiple small bowel loops up to 3.3 cm without transition zone. -continuemiralax, Linzess and bisacodyl daily -goal is for soft stool daily due to prostate cancer pushing onto rectum -not an operative candidate -Symptomatic management and comfort care.  Essential hypertension -continue metoprolol only and Imdur. -Patient resistant discontinue antihypertensive drugs at this time.  Coronary artery disease -No chest pain presently -continue metoprolol and Imdur. -Plavix, norvasc and eliquis has been  discontinued in the setting of full comfort care hematuria on admission and minimization of pill burden.  Hyperlipidemia -  statin discontinued in the setting of comfort care only.  Diabetes mellitus type 2 -Oral hypoglycemic agent has been discontinued discharge. -CBGs overall stable. -allowing for liberal control and comfort only at this point  Hypokalemia -repleted -will focus oncomfort care only and no further blood work will be pursued.  Goals of Care -DNR/DNI -Appreciate assistance and recommendation by palliative care team. -familyand patient decided comfort care andresidential hospice  Procedures: See below for x-ray reports  Consultations:  Palliative care  Neurology  Oncology  Hospice  Discharge Exam: Vitals:   04/01/20 0808 04/01/20 0826  BP:  121/75  Pulse:  79  Resp:    Temp:    SpO2: 99%     Discharge Instructions   Discharge Instructions    Discharge instructions   Complete by: As directed    Comfort care. Symptomatic management. No future hospitalization if possible. Continue tailoring patient's medication list as needed to reflect end-of-life care and comfort.     Allergies as of 04/01/2020   No Known Allergies     Medication List    STOP taking these medications   amLODipine 5 MG tablet Commonly known as: NORVASC   calcium carbonate 1250 (500 Ca) MG tablet Commonly known as: OS-CAL - dosed in mg of elemental calcium   clopidogrel 75 MG tablet Commonly known as: PLAVIX   Eliquis 5 MG Tabs tablet Generic drug: apixaban   LUPRON IJ   rosuvastatin 20 MG tablet Commonly known as: CRESTOR   Toviaz 4 MG Tb24 tablet Generic drug: fesoterodine   Xgeva 120 MG/1.7ML Soln injection Generic drug: denosumab   Xtandi 40 MG capsule Generic drug: enzalutamide     TAKE these medications   acetaminophen 325 MG tablet Commonly known as: TYLENOL Take 2 tablets (650 mg total) by mouth 3 (three) times daily.   bisacodyl 10  MG suppository Commonly known as: DULCOLAX Place 1 suppository (10 mg total) rectally every morning.   feeding supplement (ENSURE ENLIVE) Liqd Take 237 mLs by mouth 2 (two) times daily between meals.   glycopyrrolate 1 MG tablet Commonly known as: ROBINUL Take 1 tablet (1 mg total) by mouth every 4 (four) hours as needed (excessive secretions).   haloperidol 0.5 MG tablet Commonly known as: HALDOL Take 1 tablet (0.5 mg total) by mouth every 6 (six) hours as needed for agitation (or delirium).   HYDROcodone-acetaminophen 5-325 MG tablet Commonly known as: NORCO/VICODIN Take 1 tablet by mouth every 6 (six) hours as needed for severe pain. What changed:   when to take this  reasons to take this   isosorbide mononitrate 60 MG 24 hr tablet Commonly known as: IMDUR TAKE ONE TABLET BY MOUTH DAILY (MORNING) What changed: See the new instructions.   lidocaine 5 % Commonly known as: LIDODERM Place 1 patch onto the skin daily. Remove & Discard patch within 12 hours or as directed by MD Start taking on: Apr 02, 2020   linaclotide 290 MCG Caps capsule Commonly known as: Linzess Take 1 capsule (290 mcg total) by mouth daily before breakfast.   LORazepam 1 MG tablet Commonly known as: ATIVAN Take 1 tablet (1 mg total) by mouth every 8 (eight) hours as needed for anxiety.   metoprolol tartrate 50 MG tablet Commonly known as: LOPRESSOR Take 1 tablet (50 mg total) by mouth 2 (two) times daily.   pantoprazole 40 MG tablet Commonly known as: PROTONIX Take 40 mg by mouth every morning.   polyethylene glycol 17 g packet Commonly known as: MIRALAX /  GLYCOLAX Take 17 g by mouth daily.   traMADol 50 MG tablet Commonly known as: ULTRAM Take 1 tablet (50 mg total) by mouth every 8 (eight) hours as needed for moderate pain. What changed:   how much to take  when to take this  reasons to take this      No Known Allergies    The results of significant diagnostics from this  hospitalization (including imaging, microbiology, ancillary and laboratory) are listed below for reference.    Significant Diagnostic Studies: DG Chest 1 View  Result Date: 03/16/2020 CLINICAL DATA:  Weakness and mild shortness of breath. ACS. EXAM: CHEST  1 VIEW COMPARISON:  09/10/2016 FINDINGS: Post median sternotomy and CABG. The heart is normal in size. Aortic atherosclerosis and tortuosity, stable from prior exam. No pulmonary edema, focal airspace disease, pleural effusion or pneumothorax. No acute osseous abnormalities are seen. IMPRESSION: 1. No acute abnormality. 2. Post CABG with normal heart size. Aortic Atherosclerosis (ICD10-I70.0). Electronically Signed   By: Keith Rake M.D.   On: 03/16/2020 15:40   CT Head Wo Contrast  Result Date: 03/25/2020 CLINICAL DATA:  86 year old male with delirium and optimal mental status. EXAM: CT HEAD WITHOUT CONTRAST TECHNIQUE: Contiguous axial images were obtained from the base of the skull through the vertex without intravenous contrast. COMPARISON:  None. FINDINGS: Brain: There is mild age-related atrophy and chronic microvascular ischemic changes. There is no acute intracranial hemorrhage. No mass effect or midline shift. No extra-axial fluid collection. Vascular: No hyperdense vessel or unexpected calcification. Skull: Normal. Negative for fracture or focal lesion. Sinuses/Orbits: No acute finding. Other: None IMPRESSION: 1. No acute intracranial pathology. 2. Mild age-related atrophy and chronic microvascular ischemic changes. Electronically Signed   By: Anner Crete M.D.   On: 03/25/2020 02:15   CT Abdomen Pelvis W Contrast  Result Date: 03/25/2020 CLINICAL DATA:  86 year old male with abdominal distension EXAM: CT ABDOMEN AND PELVIS WITH CONTRAST TECHNIQUE: Multidetector CT imaging of the abdomen and pelvis was performed using the standard protocol following bolus administration of intravenous contrast. CONTRAST:  157mL OMNIPAQUE IOHEXOL 300  MG/ML  SOLN COMPARISON:  CT abdomen pelvis dated 03/16/2020. FINDINGS: Lower chest: Partially visualized small bilateral pleural effusions, new since the prior CT. There is associated partial compressive atelectasis of the lower lobes. Pneumonia is not excluded. Clinical correlation is recommended. There is advanced 3 vessel coronary vascular calcification. No intra-abdominal free air. Small ascites, new since the prior CT. Hepatobiliary: The liver is unremarkable. No intrahepatic biliary ductal dilatation. The gallbladder is unremarkable. Pancreas: Unremarkable. No pancreatic ductal dilatation or surrounding inflammatory changes. Spleen: Normal in size without focal abnormality. Adrenals/Urinary Tract: The adrenal glands are unremarkable. There is mild bilateral hydronephrosis, left greater right, and new since the prior CT. Small bilateral renal cysts and additional subcentimeter hypodensities which are too small to characterize. There is mild bilateral hydronephrosis. There is diffuse thickened and irregular bladder wall which may be related to chronic bladder outlet obstruction or chronic infection. Infiltrative neoplasm of the bladder wall is not excluded. There is probable degree of obstruction of the ureterovesical junctions resulting in bilateral hydronephrosis. The urinary bladder is decompressed around a suprapubic catheter. Air within the bladder likely introduced via catheter. Stomach/Bowel: The stomach is distended. There is dilatation of multiple loops of small bowel measuring up to 3.3 cm in caliber. No discrete transition identified. Findings may represent an ileus although an early obstruction is not entirely excluded clinical correlation is recommended. There is sigmoid diverticulosis without active inflammatory  changes. There is moderate stool throughout the colon. The appendix is not visualized with certainty. No inflammatory changes identified in the right lower quadrant. Vascular/Lymphatic:  Advanced aortoiliac atherosclerotic disease. The IVC is unremarkable. No portal venous gas. There is no adenopathy. Reproductive: Irregular and heterogeneously enhancing mass in the region of the prostate gland measuring 4 x 5 cm in greatest axial dimension as seen previously. There is invasion of the base of the bladder. There is also invasion of the anterior rectal wall with ulceration. This is relatively similar to prior CT. Other: Mild subcutaneous edema. Musculoskeletal: Osteopenia with degenerative changes of the spine. Similar appearance of scattered sclerotic lesions. No acute osseous pathology. IMPRESSION: 1. Interval development of mild bilateral hydronephrosis, likely due to obstruction at the level of the ureterovesical junctions secondary to mass effect caused by the pelvic mass or due to UVJ strictures secondary to tumoral invasion. 2. Probable ileus. An early small-bowel obstruction is not excluded. 3. Sigmoid diverticulosis. 4. Enlarged and irregular pelvic mass with invasion of the base of the bladder and anterior rectal wall similar to prior CT. 5. New small bilateral pleural effusions with partial compressive atelectasis of the lower lobes. 6. Aortic Atherosclerosis (ICD10-I70.0). Electronically Signed   By: Anner Crete M.D.   On: 03/25/2020 03:48   CT ABDOMEN PELVIS W CONTRAST  Result Date: 03/16/2020 CLINICAL DATA:  Acute generalized abdominal pain. Nausea. Weakness. Patient reports constipation. Active chemotherapy. History of prostate cancer. EXAM: CT ABDOMEN AND PELVIS WITH CONTRAST TECHNIQUE: Multidetector CT imaging of the abdomen and pelvis was performed using the standard protocol following bolus administration of intravenous contrast. CONTRAST:  141mL OMNIPAQUE IOHEXOL 300 MG/ML  SOLN COMPARISON:  Abdominal CT 02/22/2020 FINDINGS: Lower chest: Subsegmental linear atelectasis in the right lower lobe. Heart is normal in size. There are coronary artery calcifications. Fluid distends  the distal esophagus without wall thickening. Hepatobiliary: Borderline hepatic steatosis without focal lesion. Gallstone without pericholecystic inflammation. No biliary dilatation. Pancreas: No ductal dilatation or inflammation. Spleen: Slightly heterogeneous but no evidence of focal lesion. Normal in size. Adrenals/Urinary Tract: No adrenal nodule. No hydronephrosis. Chronic fullness of the right renal pelvis. There is symmetric renal excretion on delayed phase imaging. Cortical cyst in the right kidney, as well as bilateral low-density lesions too small to accurately characterize. Mild cortical scarring in the lower left kidney unchanged. Suprapubic tube decompresses the urinary bladder. Diffuse bladder wall thickening which is stable from prior. Stomach/Bowel: Fluid distends the distal esophagus. Distended fluid-filled stomach. Proximal small bowel are dilated and fluid-filled. Transition from dilated to nondilated small bowel in the right abdomen, series 5, image 37, series 2, image 49. The more distal small bowel is decompressed. There is no small bowel pneumatosis. Minimal mesenteric edema in the right abdomen with trace free fluid. Appendix not definitively visualized. The ascending colon is decompressed. Moderate volume of stool in the transverse, descending, and sigmoid colon. Sigmoid diverticulosis without focal diverticulitis. Possible invasion of the anterior rectal wall by prostatic mass, series 2, image 89, also seen on prior exam. Vascular/Lymphatic: Aorto bi-iliac atherosclerosis. No aortic aneurysm. The portal vein is patent. Mesenteric vessels are patent. No abdominopelvic adenopathy. Reproductive: Irregular low-density mass within the prostate, similar to prior measuring 5.6 x 4.6 cm, series 2, image 86. This likely invades the anterior wall of the rectum. Other: Trace free fluid in the right abdomen. No other ascites. No free air. No intra-abdominal abscess. Musculoskeletal: Scattered sclerotic  lesions in the pelvis are unchanged. Scattered sclerotic lesions in the spine,  also unchanged. No pathologic fracture. IMPRESSION: 1. Small-bowel obstruction with transition point in the right abdomen, likely due to adhesions. 2. Moderate stool in the transverse, descending, and sigmoid colon. Distal diverticulosis without diverticulitis. 3. Irregular low-density mass within the prostate, likely invades the anterior wall of the rectum, grossly stable over the last 3 weeks. 4. Suprapubic tube decompresses the urinary bladder. Diffuse bladder wall thickening is chronic and likely related to enlarged prostate gland. 5. Cholelithiasis without pericholecystic inflammation. 6. Sclerotic lesions in the pelvis and spine are unchanged from prior. Aortic Atherosclerosis (ICD10-I70.0). Electronically Signed   By: Keith Rake M.D.   On: 03/16/2020 17:26   DG Chest Portable 1 View  Result Date: 03/16/2020 CLINICAL DATA:  Abdominal pain, nausea, generalized weakness and constipation, history metastatic prostate cancer with ongoing oral chemotherapy EXAM: PORTABLE CHEST 1 VIEW COMPARISON:  Portable exam 2000 hours compared to 1509 hours FINDINGS: Nasogastric tube extends into stomach. Normal heart size post CABG. Mediastinal contours and pulmonary vascularity normal. Atherosclerotic calcification aorta. Lungs clear. No pleural effusion or pneumothorax. Bones demineralized. IMPRESSION: No acute abnormalities. Electronically Signed   By: Lavonia Dana M.D.   On: 03/16/2020 20:08   DG Abd 2 Views  Result Date: 03/17/2020 CLINICAL DATA:  Prostate cancer, abdominal pain, small-bowel obstruction EXAM: ABDOMEN - 2 VIEW COMPARISON:  03/16/2020 CT abdomen/pelvis FINDINGS: Enteric tube terminates in the body of the stomach. No appreciable dilated gas-filled small bowel loops. Prominent colonic stool. No evidence of pneumatosis or pneumoperitoneum. No radiopaque nephrolithiasis. Intact lower sternotomy wires. IMPRESSION: Enteric  tube terminates in the body of the stomach. No appreciable dilated gas-filled small bowel loops. Prominent colonic stool. Electronically Signed   By: Ilona Sorrel M.D.   On: 03/17/2020 08:52   DG Abd Portable 1V-Small Bowel Obstruction Protocol-initial, 8 hr delay  Result Date: 03/19/2020 CLINICAL DATA:  Small-bowel obstruction EXAM: PORTABLE ABDOMEN - 1 VIEW COMPARISON:  Mar 19, 2020 FINDINGS: There are persistent dilated loops of small bowel in the mid abdomen measuring up to approximately 3.7 cm in diameter. There is a large amount of stool in the right hemicolon. There is no pneumatosis. No free air. IMPRESSION: Persistent small bowel obstruction versus ileus. Large amount of stool in the right hemicolon. Electronically Signed   By: Constance Holster M.D.   On: 03/19/2020 20:03   DG Abd Portable 1V-Small Bowel Protocol-Position Verification  Result Date: 03/19/2020 CLINICAL DATA:  Small-bowel obstruction EXAM: PORTABLE ABDOMEN - 1 VIEW COMPARISON:  Portable exam 1053 hours compared to 03/17/2020 FINDINGS: Small amount gas in colon. Few prominent loops of small bowel in the mid abdomen. No bowel wall thickening identified. Subsegmental atelectasis at lung bases. Nasogastric tube projects over stomach. Bones demineralized. IMPRESSION: Few nonspecific dilated loops of small bowel in the mid abdomen. Electronically Signed   By: Lavonia Dana M.D.   On: 03/19/2020 11:27   DG Abd Portable 2 Views  Result Date: 03/25/2020 CLINICAL DATA:  Abdominal pain for 1 week, recent bowel obstruction EXAM: PORTABLE ABDOMEN - 2 VIEW COMPARISON:  03/19/2020 FINDINGS: Supine and upright frontal views of the abdomen and pelvis demonstrate distended gas-filled loops of small bowel measuring up to 4 cm. Overall, caliber slightly increased since prior study. Gas and stool seen throughout the colon to the rectum. No free gas in the greater peritoneal sac. Scattered gas fluid levels in the small bowel on upright projection.  IMPRESSION: 1. Distended gas fluid loops of small bowel with multiple gas fluid levels, consistent with obstruction. Increased caliber  of the small bowel since prior study. Electronically Signed   By: Randa Ngo M.D.   On: 03/25/2020 02:28    Microbiology: Recent Results (from the past 240 hour(s))  Urine culture     Status: Abnormal   Collection Time: 03/25/20  1:33 AM   Specimen: Urine, Catheterized  Result Value Ref Range Status   Specimen Description   Final    URINE, CATHETERIZED Performed at [facility], [address], [place], [place] [address]    Special Requests   Final    Normal Performed at [facility], [address], [place], [place] [address]    Culture >=100,000 COLONIES/mL KLEBSIELLA PNEUMONIAE (A)  Final   Report Status 03/27/2020 FINAL  Final   Organism ID, Bacteria KLEBSIELLA PNEUMONIAE (A)  Final      Susceptibility   Klebsiella pneumoniae - MIC*    AMPICILLIN RESISTANT Resistant     CEFAZOLIN <=4 SENSITIVE Sensitive     CEFTRIAXONE <=1 SENSITIVE Sensitive     CIPROFLOXACIN <=0.25 SENSITIVE Sensitive     GENTAMICIN <=1 SENSITIVE Sensitive     IMIPENEM <=0.25 SENSITIVE Sensitive     NITROFURANTOIN 64 INTERMEDIATE Intermediate     TRIMETH/SULFA <=20 SENSITIVE Sensitive     AMPICILLIN/SULBACTAM 4 SENSITIVE Sensitive     PIP/TAZO <=4 SENSITIVE Sensitive     * >=100,000 COLONIES/mL KLEBSIELLA PNEUMONIAE  SARS Coronavirus 2 by RT PCR (hospital order, performed in [place] hospital lab) Nasopharyngeal Nasopharyngeal Swab     Status: None   Collection Time: 03/25/20  9:27 AM   Specimen: Nasopharyngeal Swab  Result Value Ref Range Status   SARS Coronavirus 2 NEGATIVE NEGATIVE Final    Comment: (NOTE) SARS-CoV-2 target nucleic acids are NOT DETECTED. The SARS-CoV-2 RNA is generally detectable in upper and lower respiratory specimens during the acute phase of infection. The lowest concentration of SARS-CoV-2 viral copies this assay can detect is 250 copies  / mL. A negative result does not preclude SARS-CoV-2 infection and should not be used as the sole basis for treatment or other patient management decisions.  A negative result may occur with improper specimen collection / handling, submission of specimen other than nasopharyngeal swab, presence of viral mutation(s) within the areas targeted by this assay, and inadequate number of viral copies (<250 copies / mL). A negative result must be combined with clinical observations, patient history, and epidemiological information. Fact Sheet for Patients:   StrictlyIdeas.no Fact Sheet for Healthcare Providers: BankingDealers.co.za This test is not yet approved or cleared  by the [place] FDA and has been authorized for detection and/or diagnosis of SARS-CoV-2 by FDA under an Emergency Use Authorization (EUA).  This EUA will remain in effect (meaning this test can be used) for the duration of the COVID-19 declaration under Section 564(b)(1) of the Act, 21 U.S.C. section 360bbb-3(b)(1), unless the authorization is terminated or revoked sooner. Performed at [facility], [address] [place], [place] [address]   SARS Coronavirus 2 by RT PCR (hospital order, performed in [facility] hospital lab) Nasopharyngeal Nasopharyngeal Swab     Status: None   Collection Time: 03/31/20  6:00 PM   Specimen: Nasopharyngeal Swab  Result Value Ref Range Status   SARS Coronavirus 2 NEGATIVE NEGATIVE Final    Comment: (NOTE) SARS-CoV-2 target nucleic acids are NOT DETECTED. The SARS-CoV-2 RNA is generally detectable in upper and lower respiratory specimens during the acute phase of infection. The lowest concentration of SARS-CoV-2 viral copies this assay can detect is 250 copies /  mL. A negative result does not preclude SARS-CoV-2 infection and should not be used as the sole basis for treatment or other patient management decisions.  A negative result may  occur with improper specimen collection / handling, submission of specimen other than nasopharyngeal swab, presence of viral mutation(s) within the areas targeted by this assay, and inadequate number of viral copies (<250 copies / mL). A negative result must be combined with clinical observations, patient history, and epidemiological information. Fact Sheet for Patients:   StrictlyIdeas.no Fact Sheet for Healthcare Providers: BankingDealers.co.za This test is not yet approved or cleared  by the [place] FDA and has been authorized for detection and/or diagnosis of SARS-CoV-2 by FDA under an Emergency Use Authorization (EUA).  This EUA will remain in effect (meaning this test can be used) for the duration of the COVID-19 declaration under Section 564(b)(1) of the Act, 21 U.S.C. section 360bbb-3(b)(1), unless the authorization is terminated or revoked sooner. Performed at [facility], [address], [place], [place] [address]      Labs: Basic Metabolic Panel: Recent Labs  Lab 03/27/20 0507 03/28/20 0541  NA 137 137  K 2.8* 2.7*  CL 105 106  CO2 24 25  GLUCOSE 112* 107*  BUN 6* 6*  CREATININE 0.43* 0.45*  CALCIUM 7.8* 7.3*  MG  --  1.9  PHOS  --  2.7   CBC: Recent Labs  Lab 03/27/20 0507 03/28/20 0541  WBC 9.5 7.0  NEUTROABS 7.5  --   HGB 10.5* 9.5*  HCT 34.8* 31.4*  MCV 82.7 83.3  PLT 297 280   Signed:  Barton Dubois MD.  [facility] 04/01/2020, 1:23 PM

## 2020-04-01 NOTE — TOC Transition Note (Signed)
Transition of Care [facility]) - CM/SW Discharge Note   Patient Details  Name: TOMAZ GWYNN MRN: [id number] Date of Birth: 1932-03-22  Transition of Care [facility]) CM/SW Contact:  Shade Flood, LCSW Phone Number: 04/01/2020, 10:42 AM   Clinical Narrative:     [facility] for dc [facility]. Family will transport as pt would like to make a stop at his home. Spoke with Cassandra at [facility] who confirms plan.   Updated RN who will call report.   There are no other TOC needs for dc.    Barriers to Discharge: Barriers Resolved   Patient Goals and CMS Choice        Discharge Placement                       Discharge Plan and Services                                     Social Determinants of Health (SDOH) Interventions     Readmission Risk Interventions Readmission Risk Prevention Plan 03/19/2020  Transportation Screening Complete  Medication Review ([place]) Complete  PCP or Specialist appointment within 3-5 days of discharge Not Complete  HRI or Home Care Consult Complete  SW Recovery Care/Counseling Consult Complete  Palliative Care Screening Not [place] Complete  Some recent data might be hidden

## 2020-04-02 ENCOUNTER — Ambulatory Visit: Payer: Medicare HMO

## 2020-04-02 ENCOUNTER — Other Ambulatory Visit: Payer: Self-pay | Admitting: *Deleted

## 2020-04-02 ENCOUNTER — Ambulatory Visit (HOSPITAL_COMMUNITY): Payer: Medicare HMO

## 2020-04-02 ENCOUNTER — Ambulatory Visit (HOSPITAL_COMMUNITY): Payer: Medicare HMO | Admitting: Nurse Practitioner

## 2020-04-02 ENCOUNTER — Inpatient Hospital Stay (HOSPITAL_COMMUNITY): Attending: Hematology

## 2020-04-02 NOTE — Progress Notes (Deleted)
[place] [place], [place] [address]   CLINIC:  Medical Oncology/Hematology  PCP:  Glenda Chroman, MD [place] [address] [phone]   REASON FOR VISIT: Follow-up for prostate cancer   CURRENT THERAPY:  BRIEF ONCOLOGIC HISTORY:  Oncology History  Prostate cancer ([place])  11/27/2017 Initial Diagnosis   Prostate cancer ([place])    Genetic Testing   Negative genetic testing: No pathogenic variants identified. VUS in [facility] called c.3330G>C and VUS in [facility] called c.4369A>T identified on the [facility] CustomNext+RNAinsight panel. The report date is 10/23/2019.  The CustomNext-Cancer + RNAinsight panel  includes sequencing and/or deletion duplication testing of the following 52 genes: AIP, ALK, APC*, ATM*, AXIN2, BARD1, BMPR1A, BRCA1*, BRCA2*, BRIP1*, CDH1*, CDK4, CDKN1B, CDKN2A, CHEK2*, DICER1, LZTR1, MEN1, MLH1*, MSH2*, MSH3, MSH6*, MUTYH*, NBN, NF1*, NF2, NTHL1, PALB2*, PHOX2B, PMS2*, POT1, PRKAR1A, PTCH1, PTEN*, RAD51C*, RAD51D*, RECQL, SMAD4, SMARCA4, SMARCB1, SMARCE1, STK11, SUFU, TP53*, TSC1, TSC2 and VHL (sequencing and deletion/duplication); HOXB13, POLD1 and POLE (sequencing only); EPCAM and GREM1 (deletion/duplication only). DNA and RNA analyses performed for * genes.   11/15/2019 Genetic Testing   PDL-1 results from specimen [id number]         CANCER STAGING: Cancer Staging No matching staging information was found for the patient.   INTERVAL HISTORY:  Mr. Wishart 86 y.o. male returns for routine follow-up and consideration for next cycle of chemotherapy.   Due for cycle #*** of *** today.   Overall, he tells me he has been feeling pretty well. Energy levels ***; appetite ***.   Overall, he feels ready for next cycle of chemo today.     REVIEW OF SYSTEMS:  Review of Systems - Oncology   PAST MEDICAL/SURGICAL HISTORY:  Past Medical History:  Diagnosis Date  . Arthritis   . Asthma    Childhood  . Chronic back pain     . Coronary atherosclerosis of native coronary artery    a. CABG 2004 with LIMA-LAD, SVG-D1, SVG-OM, SVG-PDA. b. Cath ~2010 with occ of SVG-diagonal, distal LAD and OM filiing by collaterals. c. NSTEMI 12/2014 s/p DES to SVG-RCA. d. 11/2015: DES to distal graft of the SVG-distal RCA  . Essential hypertension   . Family history of brain cancer   . Family history of pancreatic cancer   . Foley catheter in place   . GERD (gastroesophageal reflux disease)   . Hard of hearing   . History of pneumonia   . Hyperglycemia   . Ischemic cardiomyopathy    a. EF 45% by cath 12/2014.  . Mixed hyperlipidemia   . NSTEMI (non-ST elevated myocardial infarction) ([facility]) 01/01/15  . Paroxysmal atrial fibrillation (H[facility])   . Prostate cancer [facility])    Radiation therapy 1996  . Skin cancer   . Stroke [facility])    TIA- 28 years ago    Past Surgical History:  Procedure Laterality Date  . CARDIAC CATHETERIZATION  01/01/2015   Procedure: CORONARY STENT INTERVENTION;  Surgeon: Peter M Martinique, MD;  Location: [facility] CATH LAB;  Service: Cardiovascular;;  SVG to PDA  . CARDIAC CATHETERIZATION N/A 11/21/2015   Procedure: Left Heart Cath and Cors/Grafts Angiography;  Surgeon: Troy Sine, MD;  Location: [facility] CV LAB;  Service: Cardiovascular;  Laterality: N/A;  . CARDIAC CATHETERIZATION N/A 11/21/2015   Procedure: Coronary Stent Intervention;  Surgeon: Troy Sine, MD;  Location: [place] CV LAB;  Service: Cardiovascular;  Laterality: N/A;  . CARDIAC CATHETERIZATION N/A 09/11/2016  Procedure: Left Heart Cath and Cors/Grafts Angiography;  Surgeon: Leonie Man, MD;  Location: [place] CV LAB;  Service: Cardiovascular;  Laterality: N/A;  . CARDIAC CATHETERIZATION N/A 09/11/2016   Procedure: Coronary Stent Intervention;  Surgeon: Leonie Man, MD;  Location: [place] CV LAB;  Service: Cardiovascular;  Laterality: N/A;  . CORONARY ARTERY BYPASS GRAFT  2004   LIMA to LAD, SVG to diagonal, SVG to circumflex, SVG  to PDA  . CYSTOSCOPY WITH URETHRAL DILATATION N/A 03/28/2019   Procedure: CYSTOSCOPY WITH URETHRAL  DILATATION OF STRICTURE;  Surgeon: Irine Seal, MD;  Location: [facility] ORS;  Service: Urology;  Laterality: N/A;  . GREEN LIGHT LASER TURP (TRANSURETHRAL RESECTION OF PROSTATE N/A 06/04/2016   Procedure: GREEN LIGHT LASER TURP (TRANSURETHRAL RESECTION OF PROSTATE;  Surgeon: Irine Seal, MD;  Location: [facility] ORS;  Service: Urology;  Laterality: N/A;  . LEFT HEART CATHETERIZATION WITH CORONARY/GRAFT ANGIOGRAM N/A 01/01/2015   Procedure: LEFT HEART CATHETERIZATION WITH Beatrix Fetters;  Surgeon: Peter M Martinique, MD;  Location: [facility] CATH LAB;  Service: Cardiovascular;  Laterality: N/A;  . PERCUTANEOUS CORONARY STENT INTERVENTION (PCI-S)  11/20/2015   distal SVG  with DES       . TONSILLECTOMY       SOCIAL HISTORY:  Social History   Socioeconomic History  . Marital status: Widowed    Spouse name: Not on file  . Number of children: 2  . Years of education: Not on file  . Highest education level: Not on file  Occupational History  . Occupation: Retired    Comment: Office manager  Tobacco Use  . Smoking status: Former Smoker    Packs/day: 0.50    Years: 27.00    Pack years: 13.50    Types: Cigars    Start date: 07/28/1964    Quit date: 07/28/1986    Years since quitting: 33.7  . Smokeless tobacco: Former Systems developer    Types: Chew    Quit date: 08/07/2010  . Tobacco comment: never chewed up over a pack/day  Substance and Sexual Activity  . Alcohol use: No    Alcohol/week: 0.0 standard drinks  . Drug use: No  . Sexual activity: Not Currently  Other Topics Concern  . Not on file  Social History Narrative  . Not on file   Social Determinants of Health   Financial Resource Strain:   . Difficulty of Paying Living Expenses:   Food Insecurity:   . Worried About Charity fundraiser in the Last Year:   . Arboriculturist in the Last Year:   Transportation Needs:   . Lexicographer (Medical):   [place] Kitchen Lack of Transportation (Non-Medical):   Physical Activity:   . Days of Exercise per Week:   . Minutes of Exercise per Session:   Stress:   . Feeling of Stress :   Social Connections:   . Frequency of Communication with Friends and Family:   . Frequency of Social Gatherings with Friends and Family:   . Attends Religious Services:   . Active Member of Clubs or Organizations:   . Attends Archivist Meetings:   Marland Kitchen Marital Status:   Intimate Partner Violence:   . Fear of Current or Ex-Partner:   . Emotionally Abused:   Mar[place] Kitchen Physically Abused:   . Sexually Abused:     FAMILY HISTORY:  Family History  Problem Relation Age of Onset  . Hypertension Father   . Coronary artery disease Father   .  Pancreatic cancer Mother 37  . Brain cancer Sister 95  . Brain cancer Nephew        dx age 37, d. 3s    CURRENT MEDICATIONS:  Outpatient Encounter Medications as of 04/02/2020  Medication Sig  . acetaminophen (TYLENOL) 325 MG tablet Take 2 tablets (650 mg total) by mouth 3 (three) times daily.  . bisacodyl (DULCOLAX) 10 MG suppository Place 1 suppository (10 mg total) rectally every morning.  . feeding supplement, ENSURE ENLIVE, (ENSURE ENLIVE) LIQD Take 237 mLs by mouth 2 (two) times daily between meals.  Marland Kitchen glycopyrrolate (ROBINUL) 1 MG tablet Take 1 tablet (1 mg total) by mouth every 4 (four) hours as needed (excessive secretions).  . haloperidol (HALDOL) 0.5 MG tablet Take 1 tablet (0.5 mg total) by mouth every 6 (six) hours as needed for agitation (or delirium).  Marland Kitchen HYDROcodone-acetaminophen (NORCO/VICODIN) 5-325 MG tablet Take 1 tablet by mouth every 6 (six) hours as needed for severe pain.  . isosorbide mononitrate (IMDUR) 60 MG 24 hr tablet TAKE ONE TABLET BY MOUTH DAILY (MORNING) (Patient taking differently: Take 60 mg by mouth every morning. )  . lidocaine (LIDODERM) 5 % Place 1 patch onto the skin daily. Remove & Discard patch within 12 hours or as  directed by MD  . linaclotide (LINZESS) 290 MCG CAPS capsule Take 1 capsule (290 mcg total) by mouth daily before breakfast.  . LORazepam (ATIVAN) 1 MG tablet Take 1 tablet (1 mg total) by mouth every 8 (eight) hours as needed for anxiety.  . metoprolol tartrate (LOPRESSOR) 50 MG tablet Take 1 tablet (50 mg total) by mouth 2 (two) times daily.  . pantoprazole (PROTONIX) 40 MG tablet Take 40 mg by mouth every morning.  . polyethylene glycol (MIRALAX / GLYCOLAX) 17 g packet Take 17 g by mouth daily.  . traMADol (ULTRAM) 50 MG tablet Take 1 tablet (50 mg total) by mouth every 8 (eight) hours as needed for moderate pain.  . [DISCONTINUED] acetaminophen (TYLENOL) tablet 650 mg   . [DISCONTINUED] antiseptic oral rinse (BIOTENE) solution 15 mL   . [DISCONTINUED] bisacodyl (DULCOLAX) suppository 10 mg   . [DISCONTINUED] calcium carbonate (OS-CAL - dosed in mg of elemental calcium) tablet 500 mg of elemental calcium   . [DISCONTINUED] Chlorhexidine Gluconate Cloth 2 % PADS 6 each   . [DISCONTINUED] feeding supplement (BOOST / RESOURCE BREEZE) liquid 1 Container   . [DISCONTINUED] feeding supplement (ENSURE ENLIVE) (ENSURE ENLIVE) liquid 237 mL   . [DISCONTINUED] glycopyrrolate (ROBINUL) injection 0.2 mg   . [DISCONTINUED] glycopyrrolate (ROBINUL) injection 0.2 mg   . [DISCONTINUED] glycopyrrolate (ROBINUL) tablet 1 mg   . [DISCONTINUED] haloperidol (HALDOL) tablet 0.5 mg   . [DISCONTINUED] haloperidol lactate (HALDOL) injection 0.5 mg   . [DISCONTINUED] HYDROcodone-acetaminophen (NORCO/VICODIN) 5-325 MG per tablet 1 tablet   . [DISCONTINUED] HYDROcodone-acetaminophen (NORCO/VICODIN) 5-325 MG per tablet 1 tablet   . [DISCONTINUED] lidocaine (LIDODERM) 5 % 1 patch   . [DISCONTINUED] linaclotide (LINZESS) capsule 290 mcg   . [DISCONTINUED] linaclotide (LINZESS) capsule 290 mcg   . [DISCONTINUED] LORazepam (ATIVAN) tablet 1 mg   . [DISCONTINUED] melatonin tablet 9 mg   . [DISCONTINUED] metoprolol  tartrate (LOPRESSOR) tablet 50 mg   . [DISCONTINUED] morphine 2 MG/ML injection 2 mg   . [DISCONTINUED] ondansetron (ZOFRAN) injection 4 mg   . [DISCONTINUED] ondansetron (ZOFRAN-ODT) disintegrating tablet 4 mg   . [DISCONTINUED] pantoprazole (PROTONIX) EC tablet 40 mg   . [DISCONTINUED] polyethylene glycol (MIRALAX / GLYCOLAX) packet 17 g   . [  DISCONTINUED] polyvinyl alcohol (LIQUIFILM TEARS) 1.4 % ophthalmic solution 1 drop   . [DISCONTINUED] traMADol (ULTRAM) tablet 50 mg    No facility-administered encounter medications on file as of 04/02/2020.    ALLERGIES:  No Known Allergies   PHYSICAL EXAM:  ECOG Performance status: 1  There were no vitals filed for this visit. There were no vitals filed for this visit. Physical Exam   LABORATORY DATA:  I have reviewed the labs as listed.  CBC    Component Value Date/Time   WBC 7.0 03/28/2020 0541   RBC 3.77 (L) 03/28/2020 0541   HGB 9.5 (L) 03/28/2020 0541   HCT 31.4 (L) 03/28/2020 0541   PLT 280 03/28/2020 0541   MCV 83.3 03/28/2020 0541   MCH 25.2 (L) 03/28/2020 0541   MCHC 30.3 03/28/2020 0541   RDW 16.9 (H) 03/28/2020 0541   LYMPHSABS 1.0 03/27/2020 0507   MONOABS 0.6 03/27/2020 0507   EOSABS 0.2 03/27/2020 0507   BASOSABS 0.0 03/27/2020 0507   CMP Latest Ref Rng & Units 03/28/2020 03/27/2020 03/25/2020  Glucose 70 - 99 mg/dL 107(H) 112(H) 101(H)  BUN 8 - 23 mg/dL 6(L) 6(L) 10  Creatinine 0.61 - 1.24 mg/dL 0.45(L) 0.43(L) 0.42(L)  Sodium 135 - 145 mmol/L 137 137 134(L)  Potassium 3.5 - 5.1 mmol/L 2.7(LL) 2.8(L) 3.6  Chloride 98 - 111 mmol/L 106 105 100  CO2 22 - 32 mmol/L 25 24 25   Calcium 8.9 - 10.3 mg/dL 7.3(L) 7.8(L) 8.1(L)  Total Protein 6.5 - 8.1 g/dL - - 5.3(L)  Total Bilirubin 0.3 - 1.2 mg/dL - - 0.8  Alkaline Phos 38 - 126 U/L - - 48  AST 15 - 41 U/L - - 17  ALT 0 - 44 U/L - - 17    All questions were answered to patient's stated satisfaction. Encouraged patient to call with any new concerns or questions  before his next visit to the cancer center and we can certain see him sooner, if needed.     ASSESSMENT & PLAN:  Prostate cancer ([place]) 1.  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 -Fluciclovine PET CT scan in 10/2018 showed lymphadenopathy in the chest and sclerotic lesions in the right ilium, diffuse enhancement of the prostate. -Enzalutamide was started on 02/09/2019 80 mg daily, increase to 4 tablets daily in the first week of June 2019. -He receives Lupron injections at Dr. Jethro Poling office. -He has intermittent hematuria from the suprapubic catheter.  He went to the ER and he still has some hematuria.  Eliquis and Plavix are on hold. -PSA was 0.1 on 08/24/2019.  He was tolerating enzalutamide very well. -CT AP on 09/05/2019 done for perirectal pain showed prostate substantially increased in size compared to CT scan from 04/05/2019, measuring 4.4 x 4.4 cm, previously 3.7 x 3.2 cm.  This concerning for locally recurrent prostate malignancy.  No abdominal or pelvic adenopathy.  Unchanged sclerotic lesions on T12 and L1 vertebral bodies.  Right ilium has 1 lesion. -Prostate biopsy by 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 -XRT to the prostate from 10/30/2019 through 11/13/2019, 30 GY in 10 fractions. -Germline mutation testing shows to the [place] with no targetable mutations. -We will follow up with foundation 1 testing which showed no targetable mutations -CTAP on 02/22/2020 which showed prostatic mass stable at 5.7 x 4.7 cm.  However appears to increasingly directly invade the anterior mid to lower rectal wall.  Bone metastasis in the spine and pelvis girdle are stable with no new metastasis.  No adenopathy. -He is currently taking tramadol and Tylenol every 6  hours.  He was also written a prescription for hydrocodone 5/325 to be taken at bedtime. -He will continue enzalutamide. -Labs done on -He will follow-up in 1 month with repeat labs.  2.  Bone metastasis: -She has bone mets to the right ilium, T12 and L1 vertebral bodies. [name] started on 05/02/2018. -She will continue monthly Xgeva -He will also continue taking calcium supplements.  3.  Paroxysmal atrial fibrillation: -Eliquis was on hold temporarily for hematuria -Hematuria has resolved. -Eliquis was started back.  4.  Suprapubic pain. -This is at the suprapubic catheter site. -She is taking tramadol 50 mg twice daily which is helping.       Orders placed this encounter:  No orders of the defined types were placed in this encounter.     Derek Jack, MD [place] [phone]

## 2020-04-02 NOTE — Patient Outreach (Signed)
[facility]) Care Management  04/02/2020  JANA MASTIN 12/20/1931 [id number]  Transition of care referral   Referral date : 03/25/20 Referral source : [facility] Discharge notification  Date to Discharge: 03/22/20 Facility : From [facility] to [facility].  Diagnosis : Partial Small bowel obstruction  Insurance: [employer]   Received referral , noted patient was readmitted to [facility] on 03/25/20 from [facility].     Per electronic record patient has been discharged  to residential hospice center.     Plan Patient case will be closed to [facility] care management .    Joylene Draft, RN, BSN  [place] Management Coordinator  [phone]- Mobile [phone]- Toll Free Main Office

## 2020-04-02 NOTE — Assessment & Plan Note (Deleted)
1.  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 -Fluciclovine PET CT scan in 10/2018 showed lymphadenopathy in the chest and sclerotic lesions in the right ilium, diffuse enhancement of the prostate. -Enzalutamide was started on 02/09/2019 80 mg daily, increase to 4 tablets daily in the first week of June 2019. -He receives Lupron injections at Dr. Jethro Poling office. -He has intermittent hematuria from the suprapubic catheter.  He went to the ER and he still has some hematuria.  Eliquis and Plavix are on hold. -PSA was 0.1 on 08/24/2019.  He was tolerating enzalutamide very well. -CT AP on 09/05/2019 done for perirectal pain showed prostate substantially increased in size compared to CT scan from 04/05/2019, measuring 4.4 x 4.4 cm, previously 3.7 x 3.2 cm.  This concerning for locally recurrent prostate malignancy.  No abdominal or pelvic adenopathy.  Unchanged sclerotic lesions on T12 and L1 vertebral bodies.  Right ilium has 1 lesion. -Prostate biopsy by 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 -XRT to the prostate from 10/30/2019 through 11/13/2019, 30 GY in 10 fractions. -Germline mutation testing shows to the [place] with no targetable mutations. -We will follow up with foundation 1 testing which showed no targetable mutations -CTAP on 02/22/2020 which showed prostatic mass stable at 5.7 x 4.7 cm.  However appears to increasingly directly invade the anterior mid to lower rectal wall.  Bone metastasis in the spine and pelvis girdle are stable with no new metastasis.  No adenopathy. -He is currently taking tramadol and Tylenol every 6 hours.  He was also written a prescription for hydrocodone 5/325 to be taken at bedtime. -He will continue enzalutamide. -Labs done  on -He will follow-up in 1 month with repeat labs.  2.  Bone metastasis: -She has bone mets to the right ilium, T12 and L1 vertebral bodies. [name] started on 05/02/2018. -She will continue monthly Xgeva -He will also continue taking calcium supplements.  3.  Paroxysmal atrial fibrillation: -Eliquis was on hold temporarily for hematuria -Hematuria has resolved. -Eliquis was started back.  4.  Suprapubic pain. -This is at the suprapubic catheter site. -She is taking tramadol 50 mg twice daily which is helping.

## 2020-04-03 ENCOUNTER — Ambulatory Visit: Payer: Medicare HMO

## 2020-04-04 ENCOUNTER — Ambulatory Visit: Payer: Medicare HMO

## 2020-04-04 ENCOUNTER — Encounter: Payer: Self-pay | Admitting: Radiation Oncology

## 2020-04-04 DIAGNOSIS — C61 Malignant neoplasm of prostate: Secondary | ICD-10-CM | POA: Insufficient documentation

## 2020-04-04 DIAGNOSIS — K6289 Other specified diseases of anus and rectum: Secondary | ICD-10-CM | POA: Insufficient documentation

## 2020-04-04 NOTE — Progress Notes (Signed)
Per conversation with [name] patient's radiation chart will be closed out. [name] relays that per Dr. Jamey Reas the patient will be transitioning to comfort care. Informed Merrilee Seashore, RT on L3 of this finding. Merrilee Seashore committed to dropping an EOT.

## 2020-04-05 ENCOUNTER — Ambulatory Visit: Payer: Medicare HMO

## 2020-04-05 ENCOUNTER — Ambulatory Visit: Payer: Medicare HMO | Admitting: Urology

## 2020-04-08 ENCOUNTER — Telehealth: Payer: Self-pay

## 2020-04-08 ENCOUNTER — Ambulatory Visit: Payer: Medicare HMO

## 2020-04-08 NOTE — Telephone Encounter (Signed)
Palliative Medicine RN Note:  Rec'd a call for our NP Megan from Elijah Santana daughter Elijah Santana [phone]). Elijah Santana is off-service now. Elijah Santana is concerned about Elijah Santana hospice stay. She reports that the hospice MD rounded today at [facility] and told the family that Elijah Santana is too healthy and won't be able to stay there. She reports that her understanding of his illness/the conversations with PMT indicate that, although Elijah Santana may be eating and drinking, he is likely to have a rapid decline soon, and they do not want him to return to the hospital. His family would like him to stay at the hospice facility.   We discussed that patients can "graduate" from hospice, as they can feel better once they leave the hospital and start getting better sleep and food. We also discussed the need for patients to meet the criteria for hospice-appropriateness so companies are not fraudulently billing for or treating patients.  We discussed the separation between PMT and the hospice companies, and I explained that Elijah Santana is not on service. I will make calls to see how I can help.  I called the [facility] inpatient hospice unit and spoke with the Nursing Director Elijah Santana. She confirmed that the Medical Director rounded and that he is likewise an oncologist (and very conservative when it comes to discharging ca patients). She confirmed what Elijah Santana said about Elijah Santana eating and drinking. Their doctor will round again on Thursday, and they will discuss discharge planning again then. At this time, she is unable to give me more information, as no discharge plans have been made. She is available to the family to talk if they would like to call ([phone]).  I called Elijah Santana back & explained the above. She verbalized understanding.   Elijah [place] Chiem, RN, BSN, [facility] Palliative Medicine Team 04/08/2020 12:08 PM Office [phone]

## 2020-04-09 ENCOUNTER — Ambulatory Visit: Payer: Medicare HMO

## 2020-04-10 ENCOUNTER — Ambulatory Visit: Payer: Medicare HMO

## 2020-04-12 ENCOUNTER — Ambulatory Visit: Payer: Medicare HMO

## 2020-05-16 DEATH — deceased

## 2020-07-01 IMAGING — DX DG ABDOMEN 2V
3 series · 3 of 3 positions shown · non-contrast
Comparison: 03/16/2020 CT abdomen/pelvis

CLINICAL DATA: Prostate cancer, abdominal pain, small-bowel
obstruction

EXAM:
ABDOMEN - 2 VIEW

[abdomen erect grid]
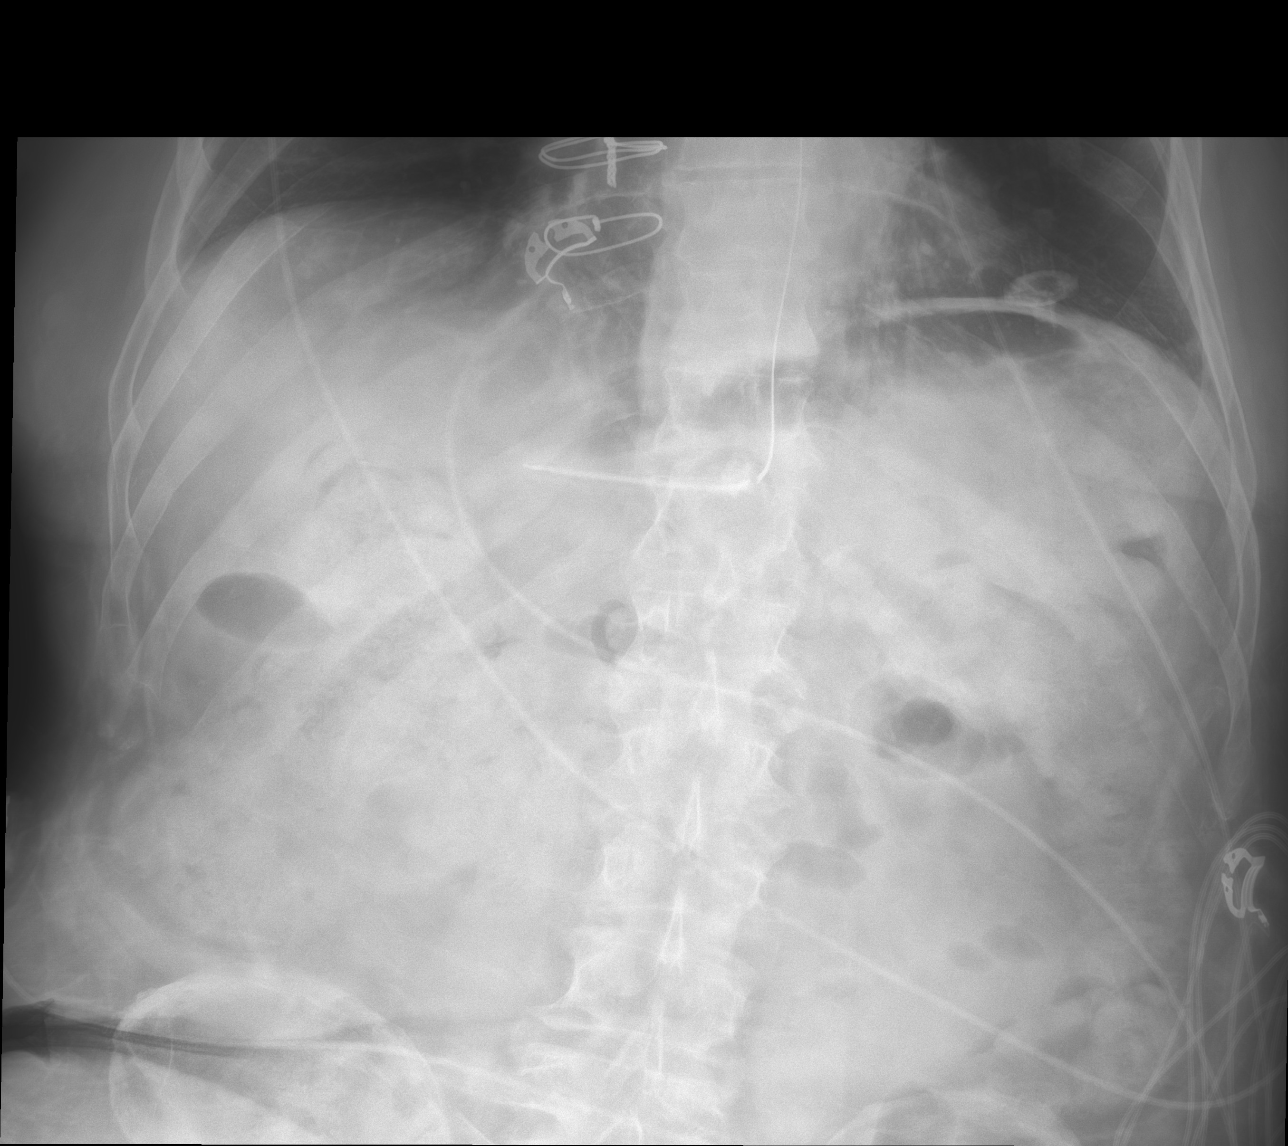

[abdomen supine grid (1 of 2)]
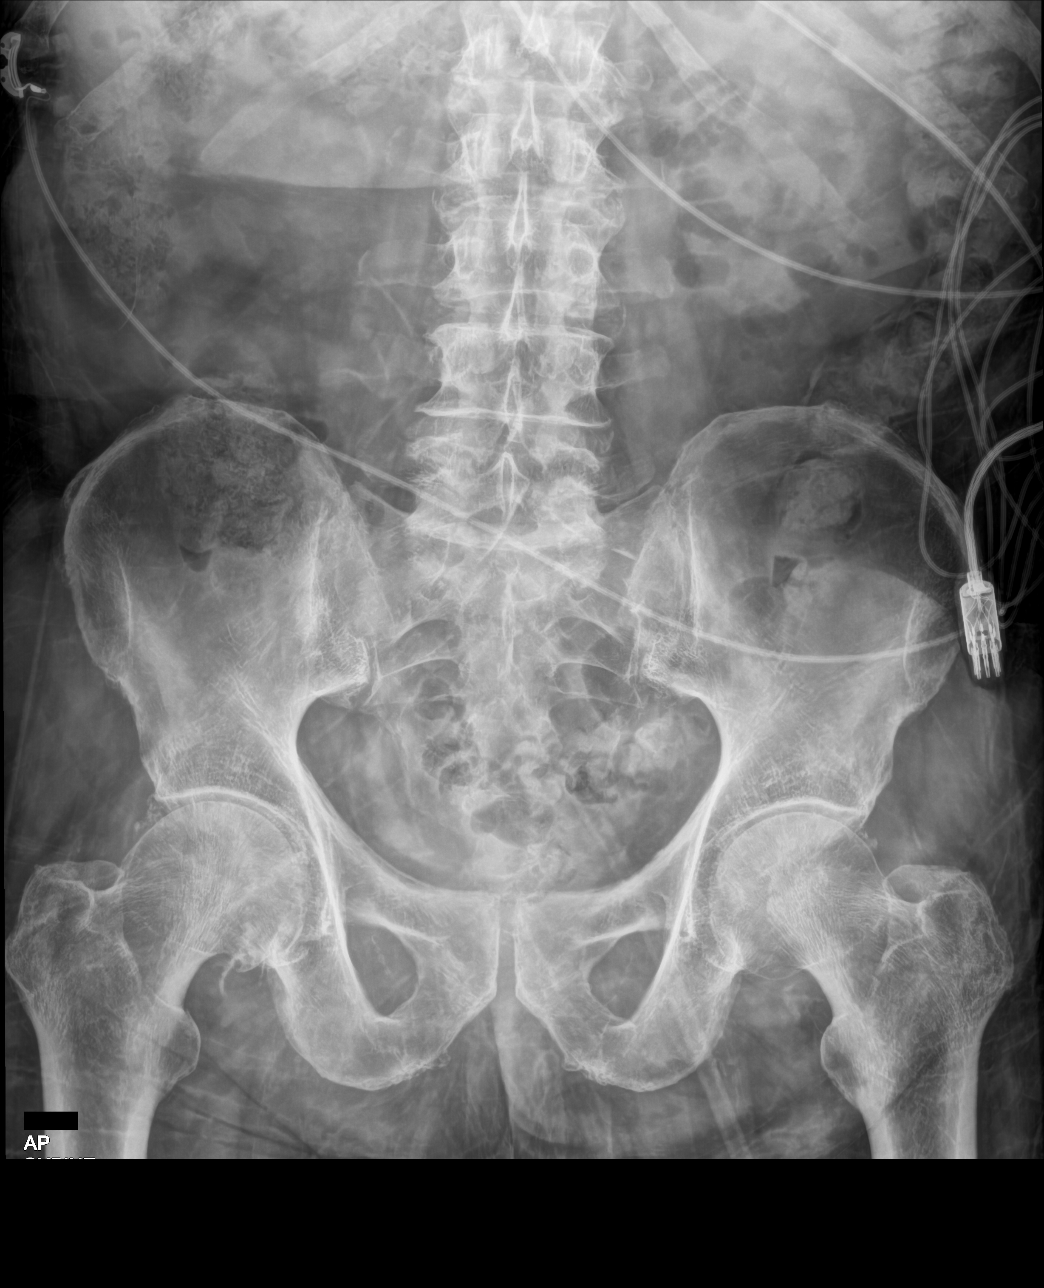

[abdomen supine grid (2 of 2)]
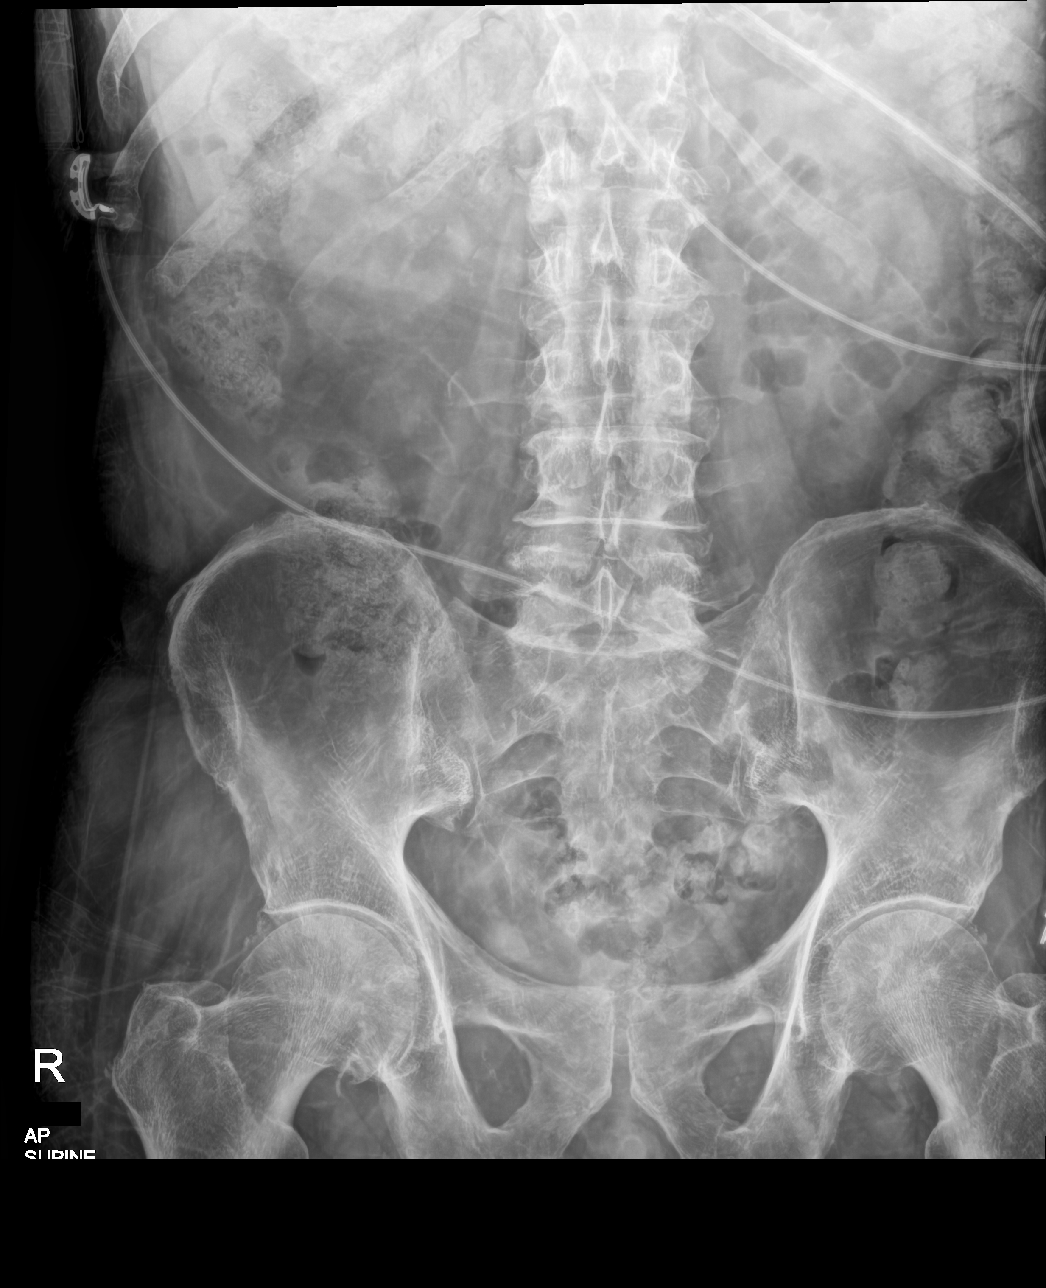

[3 of 3 positions shown; findings below may reference images not displayed]

FINDINGS: Enteric tube terminates in the body of the stomach. No appreciable
dilated gas-filled small bowel loops. Prominent colonic stool. No
evidence of pneumatosis or pneumoperitoneum. No radiopaque
nephrolithiasis. Intact lower sternotomy wires.
IMPRESSION: Enteric tube terminates in the body of the stomach. No appreciable
dilated gas-filled small bowel loops. Prominent colonic stool.

## 2020-07-03 IMAGING — DX DG ABD PORTABLE 1V
2 series · 2 of 2 positions shown · non-contrast
Comparison: Portable exam 9289 hours compared to 03/17/2020

CLINICAL DATA: Small-bowel obstruction

EXAM:
PORTABLE ABDOMEN - 1 VIEW

[abdomen supine (1 of 2)]
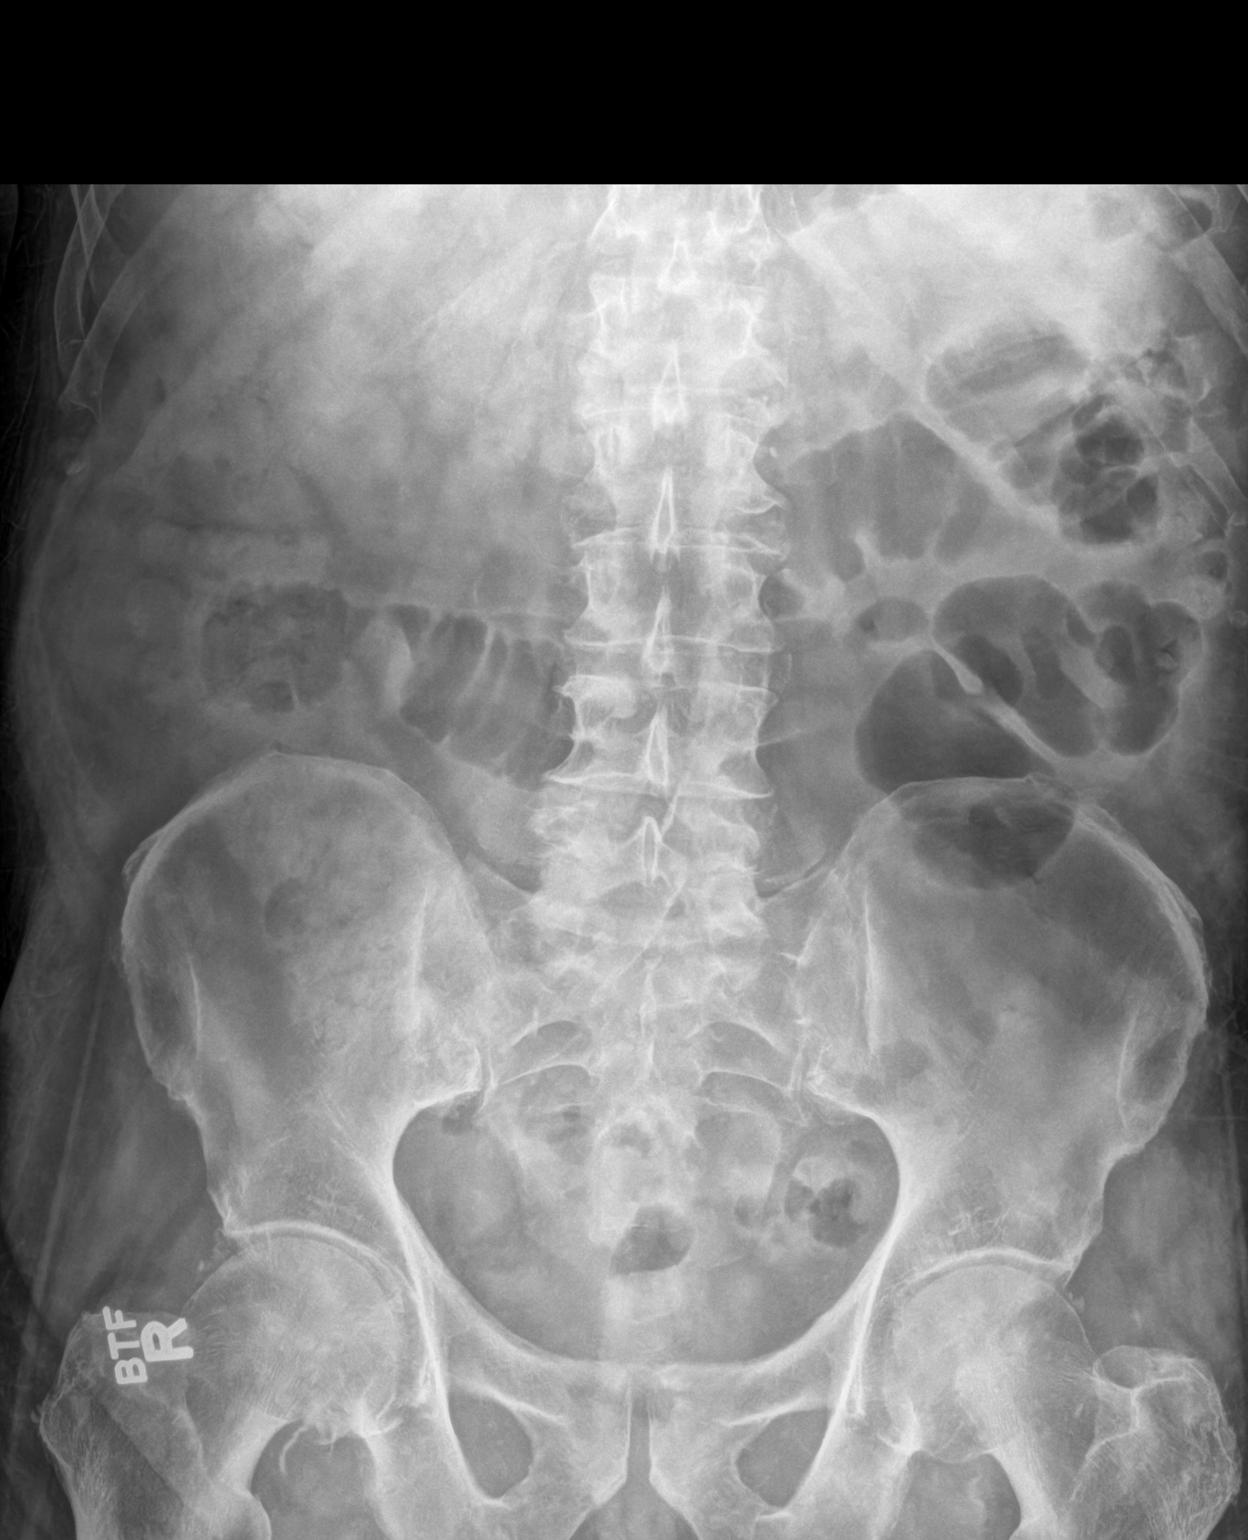

[abdomen supine (2 of 2)]
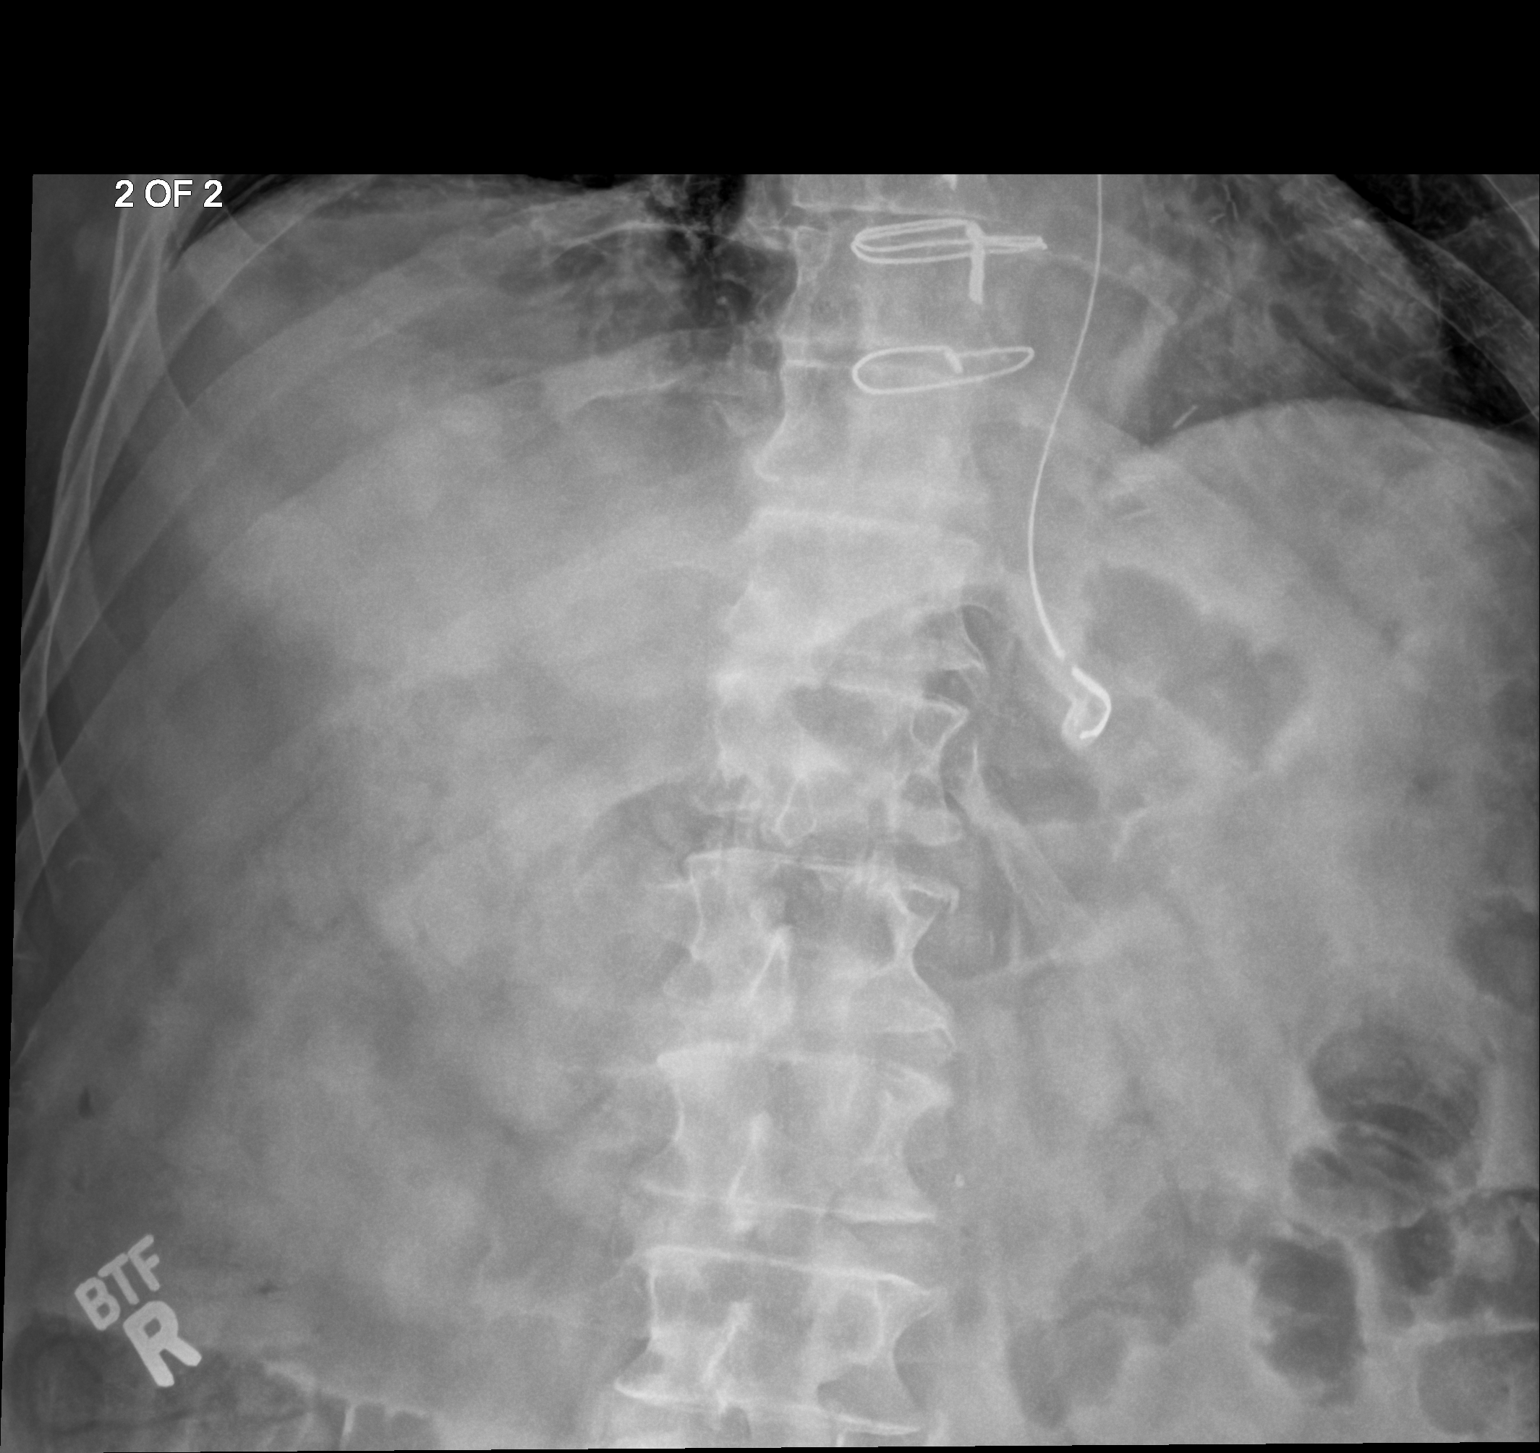

[2 of 2 positions shown; findings below may reference images not displayed]

FINDINGS: Small amount gas in colon.

Few prominent loops of small bowel in the mid abdomen.

No bowel wall thickening identified.

Subsegmental atelectasis at lung bases.

Nasogastric tube projects over stomach.

Bones demineralized.
IMPRESSION: Few nonspecific dilated loops of small bowel in the mid abdomen.

## 2020-07-03 IMAGING — DX DG ABD PORTABLE 1V
1 series · 1 of 1 positions shown · non-contrast
Comparison: March 19, 2020

CLINICAL DATA: Small-bowel obstruction

EXAM:
PORTABLE ABDOMEN - 1 VIEW

[abdomen supine]
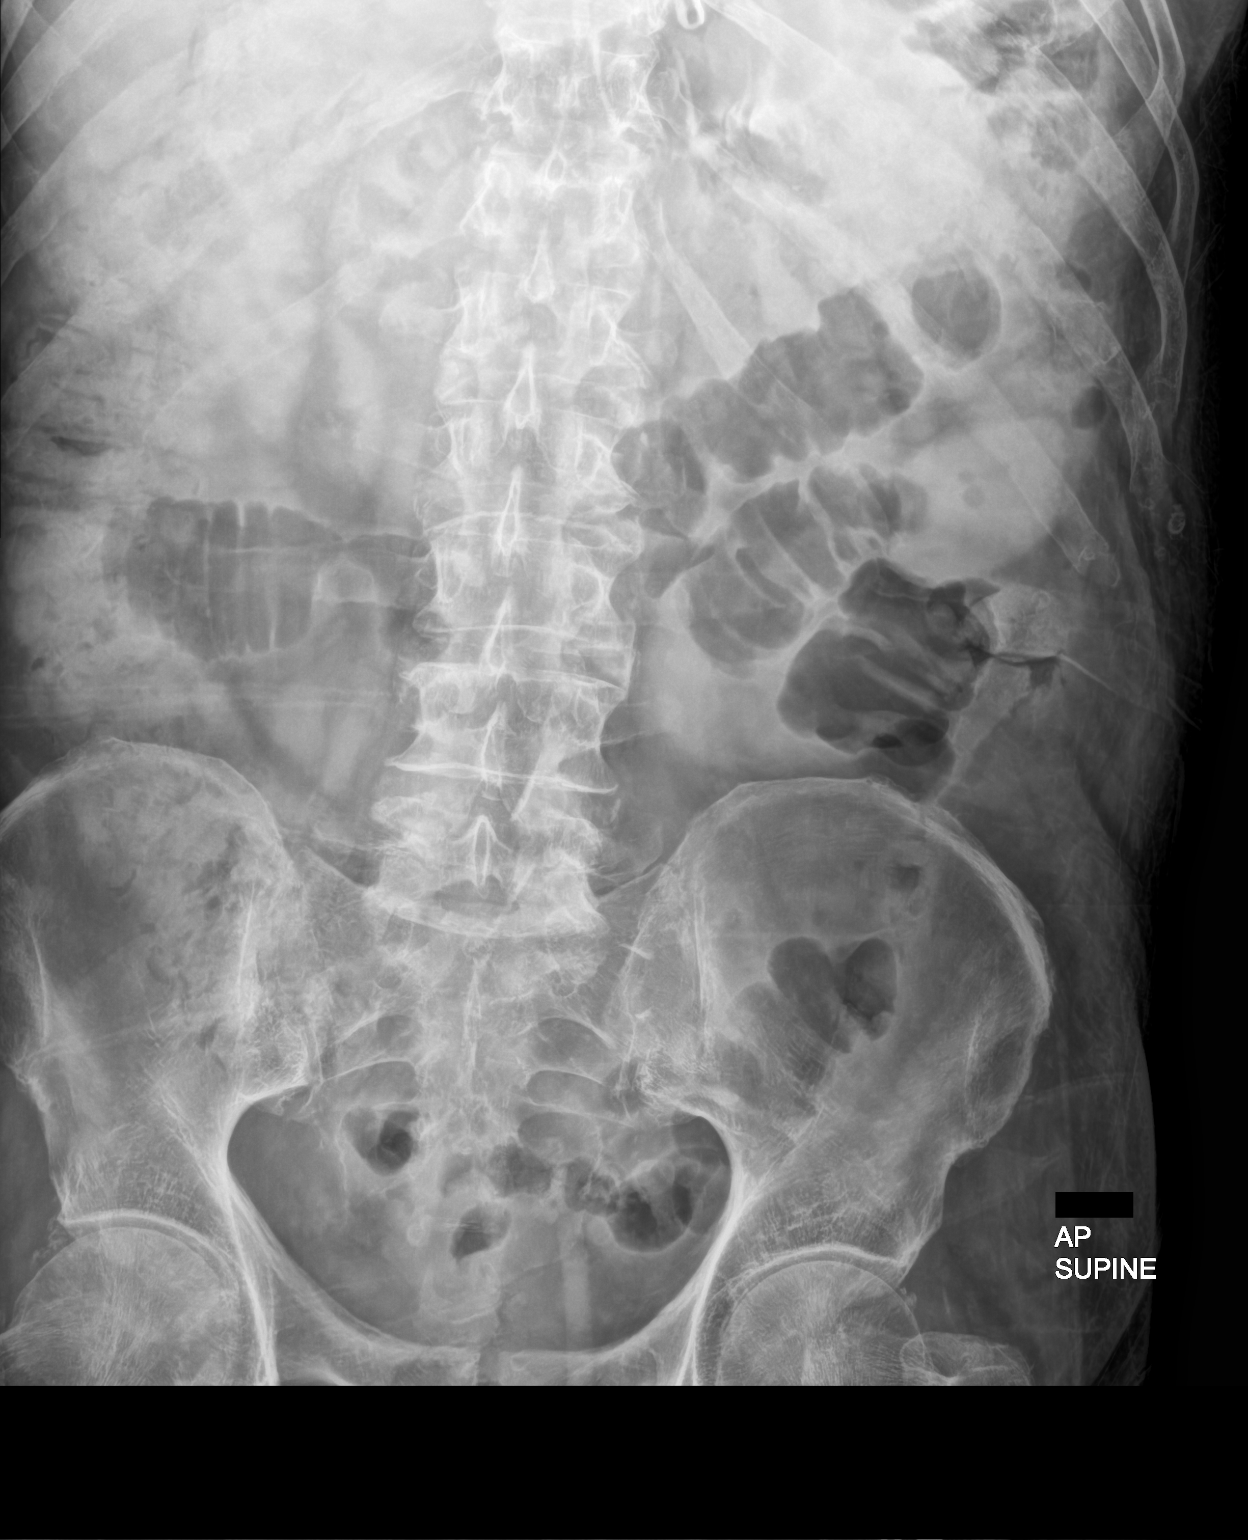

[1 of 1 positions shown; findings below may reference images not displayed]

FINDINGS: There are persistent dilated loops of small bowel in the mid abdomen
measuring up to approximately 3.7 cm in diameter. There is a large
amount of stool in the right hemicolon. There is no pneumatosis. No
free air.
IMPRESSION: Persistent small bowel obstruction versus ileus. Large amount of
stool in the right hemicolon.

## 2020-08-05 ENCOUNTER — Ambulatory Visit: Payer: Medicare HMO | Admitting: Cardiology

## 2020-10-23 NOTE — Progress Notes (Signed)
  Radiation Oncology         [phone]) [phone] ________________________________  Name: Elijah Santana MRN: [id number]  Date: 03/15/2020  DOB: [birth year]  End of Treatment Note  Diagnosis:    86 y.o. 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     Indication for treatment:  Palliative Radiotherapy       Radiation treatment dates:   4/27-4/30/21  Site/dose:   The prostate was treated to 12 Gy in 4 fractions of 3 Gy, initially intended to receive 10 fractions, but, not completed  Beams/energy:   The patient was treated with IMRT using volumetric arc therapy delivering 6 MV X-rays to clockwise and counterclockwise circumferential arcs with a 90 degree collimator offset to avoid dose scalloping.  Image guidance was performed with daily cone beam CT prior to each fraction to align to gold markers in the prostate and assure proper bladder and rectal fill volumes.  Immobilization was achieved with [employer] custom mold.  Narrative: The patient tolerated radiation treatment relatively well.   The patient experienced some minor urinary irritation and modest fatigue.    Plan: The patient has completed radiation treatment. He will return to radiation oncology clinic for routine followup in one month. I advised him to call or return sooner if he has any questions or concerns related to his recovery or treatment. ________________________________  Sheral Apley. Tammi Klippel, M.D.

## 2020-10-29 ENCOUNTER — Encounter: Payer: Self-pay | Admitting: Medical Oncology
